# Patient Record
Sex: Female | Born: 1955 | Race: White | Hispanic: No | Smoking: Former smoker
Health system: Southern US, Community
[De-identification: ages and names within clinical notes are randomized; demographics above are authoritative.]

## PROBLEM LIST (undated history)

## (undated) DIAGNOSIS — Z8489 Family history of other specified conditions: Secondary | ICD-10-CM

## (undated) DIAGNOSIS — R251 Tremor, unspecified: Secondary | ICD-10-CM

## (undated) DIAGNOSIS — T7840XA Allergy, unspecified, initial encounter: Secondary | ICD-10-CM

## (undated) DIAGNOSIS — J439 Emphysema, unspecified: Secondary | ICD-10-CM

## (undated) DIAGNOSIS — D649 Anemia, unspecified: Secondary | ICD-10-CM

## (undated) DIAGNOSIS — F419 Anxiety disorder, unspecified: Secondary | ICD-10-CM

## (undated) DIAGNOSIS — J40 Bronchitis, not specified as acute or chronic: Secondary | ICD-10-CM

## (undated) DIAGNOSIS — F329 Major depressive disorder, single episode, unspecified: Secondary | ICD-10-CM

## (undated) DIAGNOSIS — C801 Malignant (primary) neoplasm, unspecified: Secondary | ICD-10-CM

## (undated) DIAGNOSIS — F32A Depression, unspecified: Secondary | ICD-10-CM

## (undated) DIAGNOSIS — R0902 Hypoxemia: Secondary | ICD-10-CM

## (undated) DIAGNOSIS — H269 Unspecified cataract: Secondary | ICD-10-CM

## (undated) DIAGNOSIS — I342 Nonrheumatic mitral (valve) stenosis: Secondary | ICD-10-CM

## (undated) DIAGNOSIS — I050 Rheumatic mitral stenosis: Secondary | ICD-10-CM

## (undated) DIAGNOSIS — R0600 Dyspnea, unspecified: Secondary | ICD-10-CM

## (undated) DIAGNOSIS — B1920 Unspecified viral hepatitis C without hepatic coma: Secondary | ICD-10-CM

## (undated) DIAGNOSIS — M19049 Primary osteoarthritis, unspecified hand: Secondary | ICD-10-CM

## (undated) HISTORY — DX: Emphysema, unspecified: J43.9

## (undated) HISTORY — DX: Nonrheumatic mitral (valve) stenosis: I34.2

## (undated) HISTORY — DX: Anemia, unspecified: D64.9

## (undated) HISTORY — DX: Allergy, unspecified, initial encounter: T78.40XA

## (undated) HISTORY — DX: Unspecified cataract: H26.9

## (undated) HISTORY — DX: Hypoxemia: R09.02

## (undated) HISTORY — DX: Rheumatic mitral stenosis: I05.0

## (undated) HISTORY — DX: Primary osteoarthritis, unspecified hand: M19.049

## (undated) HISTORY — PX: APPENDECTOMY: SHX54

---

## 1984-10-01 HISTORY — PX: ABDOMINAL HYSTERECTOMY: SHX81

## 1998-05-23 ENCOUNTER — Other Ambulatory Visit: Admission: RE | Admit: 1998-05-23 | Discharge: 1998-05-23 | Payer: Self-pay | Admitting: Obstetrics and Gynecology

## 1999-11-10 ENCOUNTER — Other Ambulatory Visit: Admission: RE | Admit: 1999-11-10 | Discharge: 1999-11-10 | Payer: Self-pay | Admitting: Obstetrics and Gynecology

## 2000-12-19 ENCOUNTER — Other Ambulatory Visit: Admission: RE | Admit: 2000-12-19 | Discharge: 2000-12-19 | Payer: Self-pay | Admitting: Obstetrics and Gynecology

## 2002-02-19 ENCOUNTER — Other Ambulatory Visit: Admission: RE | Admit: 2002-02-19 | Discharge: 2002-02-19 | Payer: Self-pay | Admitting: Obstetrics and Gynecology

## 2003-04-29 ENCOUNTER — Other Ambulatory Visit: Admission: RE | Admit: 2003-04-29 | Discharge: 2003-04-29 | Payer: Self-pay | Admitting: Obstetrics and Gynecology

## 2004-09-05 ENCOUNTER — Other Ambulatory Visit: Admission: RE | Admit: 2004-09-05 | Discharge: 2004-09-05 | Payer: Self-pay | Admitting: Obstetrics and Gynecology

## 2005-01-31 ENCOUNTER — Ambulatory Visit: Payer: Self-pay | Admitting: Internal Medicine

## 2005-10-17 ENCOUNTER — Other Ambulatory Visit: Admission: RE | Admit: 2005-10-17 | Discharge: 2005-10-17 | Payer: Self-pay | Admitting: Obstetrics and Gynecology

## 2006-09-06 ENCOUNTER — Ambulatory Visit (HOSPITAL_COMMUNITY): Admission: RE | Admit: 2006-09-06 | Discharge: 2006-09-06 | Payer: Self-pay | Admitting: Obstetrics and Gynecology

## 2006-10-31 ENCOUNTER — Ambulatory Visit: Payer: Self-pay | Admitting: Internal Medicine

## 2007-01-27 ENCOUNTER — Ambulatory Visit: Payer: Self-pay | Admitting: Internal Medicine

## 2007-01-28 ENCOUNTER — Ambulatory Visit: Payer: Self-pay | Admitting: Gastroenterology

## 2007-01-28 LAB — CONVERTED CEMR LAB
ALT: 46 units/L — ABNORMAL HIGH (ref 0–40)
AST: 35 units/L (ref 0–37)
Albumin: 4 g/dL (ref 3.5–5.2)
Alkaline Phosphatase: 78 units/L (ref 39–117)
Amylase: 82 units/L (ref 27–131)
BUN: 11 mg/dL (ref 6–23)
Basophils Absolute: 0 10*3/uL (ref 0.0–0.1)
Basophils Relative: 1 % (ref 0.0–1.0)
Bilirubin, Direct: 0.1 mg/dL (ref 0.0–0.3)
CO2: 29 meq/L (ref 19–32)
Calcium: 9.8 mg/dL (ref 8.4–10.5)
Chloride: 106 meq/L (ref 96–112)
Creatinine, Ser: 0.7 mg/dL (ref 0.4–1.2)
Eosinophils Absolute: 0.2 10*3/uL (ref 0.0–0.6)
Eosinophils Relative: 5.1 % — ABNORMAL HIGH (ref 0.0–5.0)
GFR calc Af Amer: 113 mL/min
GFR calc non Af Amer: 94 mL/min
Glucose, Bld: 93 mg/dL (ref 70–99)
HCT: 44.3 % (ref 36.0–46.0)
Hemoglobin: 15 g/dL (ref 12.0–15.0)
Lymphocytes Relative: 36.9 % (ref 12.0–46.0)
MCHC: 33.8 g/dL (ref 30.0–36.0)
MCV: 91.7 fL (ref 78.0–100.0)
Monocytes Absolute: 0.4 10*3/uL (ref 0.2–0.7)
Monocytes Relative: 9.9 % (ref 3.0–11.0)
Neutro Abs: 2.1 10*3/uL (ref 1.4–7.7)
Neutrophils Relative %: 47.1 % (ref 43.0–77.0)
Platelets: 213 10*3/uL (ref 150–400)
Potassium: 4.6 meq/L (ref 3.5–5.1)
RBC: 4.83 M/uL (ref 3.87–5.11)
RDW: 12 % (ref 11.5–14.6)
Sodium: 142 meq/L (ref 135–145)
Total Bilirubin: 0.7 mg/dL (ref 0.3–1.2)
Total Protein: 7.5 g/dL (ref 6.0–8.3)
WBC: 4.2 10*3/uL — ABNORMAL LOW (ref 4.5–10.5)

## 2007-02-10 ENCOUNTER — Ambulatory Visit: Payer: Self-pay | Admitting: Internal Medicine

## 2007-03-21 ENCOUNTER — Ambulatory Visit: Payer: Self-pay | Admitting: Gastroenterology

## 2007-03-21 LAB — CONVERTED CEMR LAB
ALT: 115 units/L — ABNORMAL HIGH (ref 0–40)
AST: 77 units/L — ABNORMAL HIGH (ref 0–37)
Albumin: 4.4 g/dL (ref 3.5–5.2)
Alkaline Phosphatase: 96 units/L (ref 39–117)
Anti Nuclear Antibody(ANA): NEGATIVE
Bilirubin, Direct: 0.1 mg/dL (ref 0.0–0.3)
Ceruloplasmin: 56 mg/dL (ref 21–63)
Ferritin: 23.5 ng/mL (ref 10.0–291.0)
HCV Ab: POSITIVE — AB
Hep A Total Ab: NEGATIVE
Hep B S Ab: POSITIVE — AB
Hepatitis B Surface Ag: NEGATIVE
INR: 0.8 — ABNORMAL LOW (ref 0.9–2.0)
Iron: 153 ug/dL — ABNORMAL HIGH (ref 42–145)
Prothrombin Time: 10.6 s (ref 10.0–14.0)
TSH: 3.25 microintl units/mL (ref 0.35–5.50)
Tissue Transglutaminase Ab, IgA: 0.3 units (ref <7)
Total Bilirubin: 0.9 mg/dL (ref 0.3–1.2)
Total Protein: 8.1 g/dL (ref 6.0–8.3)

## 2007-04-22 ENCOUNTER — Encounter: Payer: Self-pay | Admitting: Internal Medicine

## 2007-04-22 ENCOUNTER — Ambulatory Visit: Payer: Self-pay | Admitting: Gastroenterology

## 2007-04-22 ENCOUNTER — Encounter: Payer: Self-pay | Admitting: Gastroenterology

## 2007-04-22 LAB — CONVERTED CEMR LAB
Basophils Absolute: 0 10*3/uL (ref 0.0–0.1)
Basophils Relative: 0.3 % (ref 0.0–1.0)
Eosinophils Absolute: 0.1 10*3/uL (ref 0.0–0.6)
Eosinophils Relative: 1.6 % (ref 0.0–5.0)
HCT: 42.8 % (ref 36.0–46.0)
Hemoglobin: 15.2 g/dL — ABNORMAL HIGH (ref 12.0–15.0)
INR: 0.8 — ABNORMAL LOW (ref 0.9–2.0)
Lymphocytes Relative: 23.8 % (ref 12.0–46.0)
MCHC: 35.5 g/dL (ref 30.0–36.0)
MCV: 87.9 fL (ref 78.0–100.0)
Monocytes Absolute: 0.6 10*3/uL (ref 0.2–0.7)
Monocytes Relative: 8.6 % (ref 3.0–11.0)
Neutro Abs: 4.3 10*3/uL (ref 1.4–7.7)
Neutrophils Relative %: 65.7 % (ref 43.0–77.0)
Platelets: 222 10*3/uL (ref 150–400)
Prothrombin Time: 10.5 s (ref 10.0–14.0)
RBC: 4.87 M/uL (ref 3.87–5.11)
RDW: 12.1 % (ref 11.5–14.6)
WBC: 6.6 10*3/uL (ref 4.5–10.5)
aPTT: 25.3 s — ABNORMAL LOW (ref 26.5–36.5)

## 2007-04-29 ENCOUNTER — Ambulatory Visit (HOSPITAL_COMMUNITY): Admission: RE | Admit: 2007-04-29 | Discharge: 2007-04-29 | Payer: Self-pay | Admitting: Gastroenterology

## 2007-05-27 ENCOUNTER — Ambulatory Visit: Payer: Self-pay | Admitting: Gastroenterology

## 2007-10-10 ENCOUNTER — Encounter (INDEPENDENT_AMBULATORY_CARE_PROVIDER_SITE_OTHER): Payer: Self-pay | Admitting: Interventional Radiology

## 2007-10-10 ENCOUNTER — Ambulatory Visit (HOSPITAL_COMMUNITY): Admission: RE | Admit: 2007-10-10 | Discharge: 2007-10-10 | Payer: Self-pay | Admitting: Gastroenterology

## 2007-11-20 ENCOUNTER — Ambulatory Visit: Payer: Self-pay | Admitting: Gastroenterology

## 2007-12-16 ENCOUNTER — Ambulatory Visit: Payer: Self-pay | Admitting: Surgery

## 2007-12-30 ENCOUNTER — Ambulatory Visit: Payer: Self-pay | Admitting: Gastroenterology

## 2007-12-30 ENCOUNTER — Encounter: Payer: Self-pay | Admitting: Gastroenterology

## 2007-12-30 ENCOUNTER — Encounter: Payer: Self-pay | Admitting: Internal Medicine

## 2008-01-27 ENCOUNTER — Ambulatory Visit: Payer: Self-pay | Admitting: Gastroenterology

## 2008-02-04 ENCOUNTER — Telehealth: Payer: Self-pay | Admitting: Gastroenterology

## 2008-03-19 ENCOUNTER — Ambulatory Visit: Payer: Self-pay | Admitting: Internal Medicine

## 2008-03-19 DIAGNOSIS — Z87442 Personal history of urinary calculi: Secondary | ICD-10-CM

## 2008-03-19 DIAGNOSIS — F329 Major depressive disorder, single episode, unspecified: Secondary | ICD-10-CM

## 2008-03-19 DIAGNOSIS — F321 Major depressive disorder, single episode, moderate: Secondary | ICD-10-CM

## 2008-03-19 DIAGNOSIS — K219 Gastro-esophageal reflux disease without esophagitis: Secondary | ICD-10-CM

## 2008-03-19 DIAGNOSIS — K612 Anorectal abscess: Secondary | ICD-10-CM | POA: Insufficient documentation

## 2008-03-19 HISTORY — DX: Gastro-esophageal reflux disease without esophagitis: K21.9

## 2008-03-19 HISTORY — DX: Personal history of urinary calculi: Z87.442

## 2008-03-19 HISTORY — DX: Major depressive disorder, single episode, moderate: F32.1

## 2010-07-12 ENCOUNTER — Encounter: Payer: Self-pay | Admitting: Internal Medicine

## 2010-08-01 ENCOUNTER — Ambulatory Visit: Payer: Self-pay | Admitting: Internal Medicine

## 2010-08-01 DIAGNOSIS — G43909 Migraine, unspecified, not intractable, without status migrainosus: Secondary | ICD-10-CM

## 2010-08-01 HISTORY — DX: Migraine, unspecified, not intractable, without status migrainosus: G43.909

## 2010-10-06 ENCOUNTER — Encounter (HOSPITAL_COMMUNITY)
Admission: RE | Admit: 2010-10-06 | Discharge: 2010-10-31 | Payer: Self-pay | Source: Home / Self Care | Attending: Orthopedic Surgery | Admitting: Orthopedic Surgery

## 2010-10-31 NOTE — Assessment & Plan Note (Signed)
Summary: hemorrhoids/dm   Vital Signs:  Patient Profile:   55 Years Old Female Weight:      134 pounds Temp:     98.3 degrees F oral BP sitting:   120 / 84  (left arm) Cuff size:   regular  Vitals Entered By: Raechel Ache, RN (March 19, 2008 2:20 PM)                 Chief Complaint:  C/o lump outside rectum, extremely painful radiating down L leg- almost went to ER last night. Also vaginitis; odorous discharge, and red and swollen. Has Hep C..  History of Present Illness: 55 year old female with a 5 day history of rectal discomfort.  She also has a history of chronic vaginitis and for the past few days.  Has noted an increase vaginal odor and discharge.  There's been no fever or systemic symptoms.  She has been using sitz baths.    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)       gravida two, para two, abortus zero   Family History:    Family History of Arthritis    father died at 61 -murder    mother history of fibromyalgia, hypertension, obesity, gastroesophageal reflux disease, cerebrovascular disease        Two brothers are in good health    one sister, status post hysterectomy  Social History:    Former Smoker    Alcohol use-no    Drug use-no    Regular exercise-no    Married    two children, 6 grandchildren     Physical Exam  General:     uncomfortable but in no acute distress Rectal:     no visual  external abnormalities; she had  considerable tenderness involving the anal area from about the 3 to the 6 o'clock position.  Internal exam was unremarkable.  There is no fluctuanceperirectal tenderness.      Impression & Recommendations:  Problem # 1:  ABSCESS, PERIRECTAL  (ICD-566) patient has significant tenderness in the left perirectal area; this seems most consistent with a small perirectal abscess.  Will continue sitz baths 3 times to 4 times daily and treat with the metronidazole for infection and Vicodin for pain control.  She is aware that this may require I&D and will call the pain worsens  Complete Medication List: 1)  Metronidazole 500 Mg Tabs (Metronidazole) .... One three times daily 2)  Hydrocodone-acetaminophen 5-500 Mg Tabs (Hydrocodone-acetaminophen) .... One every 6 hours for pain   Patient Instructions: 1)  continue with the sitz baths 4 times daily 2)  Take your antibiotic as prescribed until ALL of it is gone, but stop if you develop a rash or swelling and contact our office as soon as possible. 3)  call if pain is not improved or worsens   Prescriptions: HYDROCODONE-ACETAMINOPHEN 5-500 MG  TABS (HYDROCODONE-ACETAMINOPHEN) one every 6 hours for pain  #40 x 0   Entered and Authorized by:   Gordy Savers  MD   Signed by:   Gordy Savers  MD on 03/19/2008   Method used:   Print then Give to Patient   RxID:   [id number] METRONIDAZOLE 500 MG  TABS (METRONIDAZOLE)  one three times daily  #30 x 0   Entered and Authorized by:   Gordy Savers  MD   Signed by:   Gordy Savers  MD on 03/19/2008   Method used:   Print then Give to Patient   RxID:   [id number]  ]

## 2010-10-31 NOTE — Procedures (Signed)
Summary: egd  egd   Imported By: Kassie Mends 03/22/2008 08:57:29  _____________________________________________________________________  External Attachment:    Type:   Image     Comment:   egd

## 2010-10-31 NOTE — Progress Notes (Signed)
Summary: NEEDS PRINTOUT OF APPTS  Phone Note Call from Patient Call back at Home Phone [phone]   Caller: Patient Summary of Call: [name] PT IS REQUESTING A PRINTOUT OF ALL VISITS, NO MATTER WHAT TYPE OF VISIT, SINCE HER VERY FIRST APPT WITH DR Kamron Vanwyhe.  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  PATIENT'S CHART HAS BEEN REQUESTED  Initial call taken by: Magdalen Spatz [facility],  Feb 04, 2008 8:40 AM  Follow-up for Phone Call        pt informed that she will need to come to [facility] and sign a release and t hey will  they will give her whatever she needs from her chart.she will do so Follow-up by: Teryl Lucy RN,  Feb 04, 2008 2:57 PM

## 2010-10-31 NOTE — Assessment & Plan Note (Signed)
Summary: migraines//ccm   Vital Signs:  Patient profile:   55 year old female Weight:      119 pounds Temp:     98.0 degrees F oral BP sitting:   104 / 68  (left arm) Cuff size:   regular  Vitals Entered By: Alfred Levins, CMA (August 01, 2010 12:57 PM) CC: itching all over, migraines, discuss labs   CC:  itching all over, migraines, and discuss labs.  History of Present Illness: 55 year old patient, who presents with a 4 week history of generalized pruritus.  She has seen dermatology in the laboratory screen revealed elevated liver function studies.  These are not available for review.  She does have a history of chronic hepatitis C, diagnosed in 2008.  A liver biopsy was performed at that time.  She has a history of depression and also migraine headaches.  Her depression has been stable, but she feels that her migraine headaches have become slightly more frequent.  They respond well to Fioricet.  Her chief complaint is pruritus  Current Medications (verified): 1)  Vivelle-Dot 0.0375 Mg/24hr  Pttw (Estradiol) .... Once Daily To Skin 2)  Lexapro 20 Mg Tabs (Escitalopram Oxalate) .Marland Kitchen.. 1 By Mouth Daily 3)  Fioricet 50-325-40 Mg Tabs (Butalbital-Apap-Caffeine) .... 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 Family History of Arthritis father died at 106 -murder mother history of fibromyalgia, hypertension, obesity, gastroesophageal reflux disease, cerebrovascular disease  Two brothers are in good health one sister, status post hysterectomy  Social History: Reviewed  history from 03/19/2008 and no changes required.  Smoker Alcohol use-no Drug use-no Regular exercise-no Married two children, 6 grandchildren  Review of Systems       The patient complains of headaches.  The patient denies anorexia, fever, weight loss, weight gain, vision loss, decreased hearing, hoarseness, chest pain, syncope, dyspnea on exertion, peripheral edema, prolonged cough, hemoptysis, melena, hematochezia, severe indigestion/heartburn, hematuria, incontinence, genital sores, muscle weakness, suspicious skin lesions, difficulty walking, depression, unusual weight change, abnormal bleeding, enlarged lymph nodes, angioedema, and breast masses.    Physical Exam  General:  Well-developed,well-nourished,in no acute distress; alert,appropriate and cooperative throughout examination Head:  Normocephalic and atraumatic without obvious abnormalities. No apparent alopecia or balding. Eyes:  No corneal or conjunctival inflammation noted. EOMI. Perrla. Funduscopic exam benign, without hemorrhages, exudates or papilledema. Vision grossly normal. Mouth:  Oral mucosa and oropharynx without lesions or exudates.  Teeth in good repair. Neck:  No deformities, masses, or tenderness noted. Lungs:  Normal respiratory effort, chest expands symmetrically. Lungs are clear to auscultation, no crackles or wheezes. Heart:  Normal rate and regular rhythm. S1 and S2 normal without gallop, murmur, click, rub or other extra sounds. Abdomen:  Bowel sounds positive,abdomen soft and non-tender without masses, organomegaly or hernias noted.  liver span seems slightly enlarged by percussion, but no definite liver edge palpable Msk:  No deformity or scoliosis noted of thoracic or lumbar spine.     Impression & Recommendations:  Problem # 1:  DEPRESSION (ICD-311)  Her updated medication list for this problem includes:    Lexapro 20 Mg Tabs (Escitalopram oxalate) .Marland Kitchen... 1 by mouth daily  Hydroxyzine Pamoate 50  Mg Caps (Hydroxyzine pamoate) ..... One every 6 hours as needed for itching  Her updated medication list for this problem includes:    Lexapro 20 Mg Tabs (Escitalopram oxalate) .Marland Kitchen... 1 by mouth daily    Hydroxyzine Pamoate 50 Mg Caps (Hydroxyzine pamoate) ..... One every 6 hours as needed for itching  Problem # 2:  HEPATITIS C (ICD-070.51) unclear whether this is a factor with the pruritus.  Will follow-up with GI  Problem # 3:  HEADACHE (ICD-784.0)  The following medications were removed from the medication list:    Hydrocodone-acetaminophen 5-500 Mg Tabs (Hydrocodone-acetaminophen) ..... One every 6 hours for pain Her updated medication list for this problem includes:    Fioricet 50-325-40 Mg Tabs (Butalbital-apap-caffeine) ..... Once daily  The following medications were removed from the medication list:    Hydrocodone-acetaminophen 5-500 Mg Tabs (Hydrocodone-acetaminophen) ..... One every 6 hours for pain Her updated medication list for this problem includes:    Fioricet 50-325-40 Mg Tabs (Butalbital-apap-caffeine) ..... Once daily  Complete Medication List: 1)  Vivelle-dot 0.0375 Mg/24hr Pttw (Estradiol) .... Once daily to skin 2)  Lexapro 20 Mg Tabs (Escitalopram oxalate) .Marland Kitchen.. 1 by mouth daily 3)  Fioricet 50-325-40 Mg Tabs (Butalbital-apap-caffeine) .... Once daily 4)  Hydroxyzine Pamoate 50 Mg Caps (Hydroxyzine pamoate) .... One every 6 hours as needed for itching  Patient Instructions: 1)  follow-up with GI 2)  try Allegra 180 mg daily 3)  Please schedule a follow-up appointment as needed. Prescriptions: HYDROXYZINE PAMOATE 50 MG CAPS (HYDROXYZINE PAMOATE) one every 6 hours as needed for itching  #30 x 6   Entered and Authorized by:   Gordy Savers  MD   Signed by:   Gordy Savers  MD on 08/01/2010   Method used:   Print then Give to Patient   RxID:   [id number] FIORICET 50-325-40 MG TABS [facility]) once daily  #90 x 2   Entered and  Authorized by:   Gordy Savers  MD   Signed by:   Gordy Savers  MD on 08/01/2010   Method used:   Print then Give to Patient   RxID:   [id number] LEXAPRO 20 MG TABS (ESCITALOPRAM OXALATE) 1 by mouth daily  #90 x 4   Entered and Authorized by:   Gordy Savers  MD   Signed by:   Gordy Savers  MD on 08/01/2010   Method used:   Print then Give to Patient   RxID:   [id number]    Orders Added: 1)  Est. Patient Level III [01601]

## 2011-02-13 NOTE — Assessment & Plan Note (Signed)
[facility]                         GASTROENTEROLOGY OFFICE NOTE   NAME:BERRIERValta, [name]                    MRN:          [id number]  DATE:03/21/2007                            DOB:          07-21-1956    REFERRING PHYSICIAN:  Gordy Savers, MD   REASON FOR REFERRAL:  Dr. Amador Cunas asked me to evaluate [name]  in consultation regarding chronic right upper quadrant pain.   HPI:  [name] is a very pleasant 55 year old woman, who has had a  pain in her right upper quadrant for 6-12 months.  She describes a spasm  type pain that is nearly constant throughout the day, although she wakes  up without the pain.  The pain is on her right side, just under her  ribs.  She says rubbing will sometimes help, leaning over will make it  feel unusual, lying on her right side hurts.  Sometimes she will have a  pain in her right upper back, as well.  The pain will stay fairly  constant throughout the day.  Eating certain foods, such as greasy  foods, will tend to make the pain worse.  She has had no nausea or  vomiting.   She also describes rather classic pyrosis.  This occurs nearly daily.  She tried Prevacid for it in the past, but was not taking it at the  correct time in relation to food and did not notice much of an  improvement.   She is also very clear that she has an intolerance to lactose products.  She avoids these.   She was told, at some point in the past, that she had both hepatitis B  and hepatitis C.  It is not clear how this was diagnosed.   Lastly, she had been bothered by chronic constipation for years, but has  recently been introduced to MiraLax and says that this dramatically has  helped and, on one scoop of MiraLax a day, she will have a bowel  movement nearly every day, non-bloody, without straining.   Recent laboratory tests show an essentially normal CBC, except for a  white count of 4.2 thousand, normal complete  metabolic profile, except  for an ALT of 46.  She had an ultrasound performed, April 2008, that was  normal, without gallbladder stones.  She had a CT scan, May of 2008,  which showed some sub-centimeter low-density lesions in her liver, but  was otherwise normal.   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   PAST MEDICAL HISTORY:  Anxiety, depression, kidney stones, allergies,  status post hysterectomy, status post appendectomy.   CURRENT MEDICINES:  Jeffie Pollock.   ALLERGIES:  To MORPHINE, ASPIRIN.   SOCIAL HISTORY:  Divorced recently.  Two children.  Works as a Freight forwarder, very stressful job.  Smokes one pack of cigarettes a day.  Nondrinker.  Rare caffeine and rare peppermint.   FAMILY HISTORY:  No colon cancer, colon polyps in family.   PHYSICAL EXAM:  Height 5 feet 4 inches, 134 pounds.  Blood pressure  104/52, pulse 76.  CONSTITUTIONAL:  Generally well-appearing.  NEUROLOGIC:  Alert and oriented times three.  EYES:  Extraocular movements intact.  MOUTH:  Oropharynx moist, no lesions.  NECK:  Supple, no lymphadenopathy.  CARDIOVASCULAR:  Heart regular rate and rhythm.  LUNGS:  Clear to auscultation bilaterally.  ABDOMEN:  Soft, nontender, nondistended.  Normal bowel sounds.  EXTREMITIES:  No lower extremity edema.  SKIN:  No rash or lesions on visible extremities.   ASSESSMENT AND PLAN:  Patient is a 55 year old woman with pyrosis, right  upper quadrant discomforts, chronic constipation that is much improved  on MiraLax; abnormal liver tests.   First, she has a questionable history of hepatitis B and C and has a  slightly elevated ALT.  I will arrange for more formal liver testing  with hepatitis viral serologies, ANA, AMA, anti-smooth-muscle  antibodies, ceruloplasmin, alpha 1 antitrypsin, iron studies, as well as  repeat liver tests and an  INR.   Her constipation is much improved on MiraLax and I encouraged her to  continue that on a daily basis.  We did not discuss colorectal cancer  screening at this point, but I will bring that up with her in the  future, if we can get her right upper quadrant discomfort settled down  first.   Her right upper quadrant discomfort is of unclear etiology.  Perhaps she  has GERD-related dyspepsia.  She was on Prevacid in the past and did not  notice much improvement with the right upper quadrant discomfort or her  more classic pyrosis, but she was not taking the Prevacid at the correct  time in relation to food and so I have given her samples of a proton  pump inhibitor and instructed her to take it 20-30 minutes prior to her  breakfast meal on a daily basis.  I will arrange for her to have an EGD  done in about three to four weeks.  I want to give her at least two to  three weeks time on PPI to judge whether that has been effective at the  time of her EGD.  Right upper quadrant pain sometimes is from a biliary  source.  Her gallbladder has been normal on CT scan, as well as  ultrasound, but possibly does not function normally.  We will have to  consider HIDA scan to estimate gallbladder ejection fraction, if the  remainder of her workup is negative.     Rachael Fee, MD  Electronically Signed    DPJ/MedQ  DD: 03/21/2007  DT: 03/21/2007  Job #: [id number]   cc:   Gordy Savers, MD

## 2011-02-13 NOTE — Assessment & Plan Note (Signed)
[facility]                         GASTROENTEROLOGY OFFICE NOTE   NAME:[name], [name]                     MRN:          [id number]  DATE:01/27/2008                            DOB:          16-Oct-1955    PRIMARY CARE PHYSICIAN:  Dr. Gordy Savers.   GI PROBLEM LIST:  1. Chronic hepatitis-C, mildly elevated transaminases:  Recent liver      biopsy under the direction of Dr. Molly Maduro L. Foy Guadalajara showed no      evidence of cirrhosis.  She will continue to follow up with Dr.      Foy Guadalajara.  2. Intermittent right upper quadrant pains, question spasm:  A CT scan      in 2008, essentially negative.  An ultrasound in 2008, normal      gallbladder with no stones.  A HIDA scan in 2008, normal.  An EGD      in 2008, normal.  Biopsies of the duodenum were normal.  Likely      spasm.  3. Routine risk for colorectal cancer:  She is status post colonoscopy      in March 2009.  Two small hyperplastic polyps were removed.  She      will need to repeat colonoscopy in 2019, and is in our reminder      system.   INTERVAL HISTORY:  I last saw [name] at the time of her colonoscopy.  She brought up these right upper quadrant pains that she had been  continuing to have since her initial evaluation by me about one year  ago.  The pains are decreasing in frequency.  She probably has an attack  of this right upper quadrant pain once every week or two.  The pain will  last about two hours.  She will rub her belly and that will sometimes  make it feel better.  She points to greasy fatty foods as being an often  culprit.  Moving her bowels does not tend to make any difference.   CURRENT MEDICATIONS:  1. EstroGel.  2. MiraLax.   PHYSICAL EXAMINATION:  VITAL SIGNS:  Weight 134 pounds which is stable  over one year.  Blood pressure 120/60, pulse 72.  CONSTITUTIONAL:  Generally well-appearing.  ABDOMEN:  Soft, nontender, nondistended with normal bowel sounds.   ASSESSMENT/PLAN:  A 55 year old woman with intermittent right upper  quadrant pains:  Her symptoms really seem biliary but her gallbladder  has been looked at with ultrasound, HIDA scan and has been normal.  She  does have elevated liver tests, but this is more likely from her chronic  hepatitis-C which is being followed by Dr. Foy Guadalajara.  I recommended simply  that she take an antispasmodic, Levsin sublingual and I will call her in  a prescription for that.  She will take this on an as-needed basis.   FOLLOWUP:  She will return to see me in two to three months, and sooner  if needed.     Rachael Fee, MD  Electronically Signed    DPJ/MedQ  DD: 01/27/2008  DT: 01/27/2008  Job #: [id number]   cc:  Marletta Lor, MD

## 2011-02-13 NOTE — Assessment & Plan Note (Signed)
[facility]                            [facility] OFFICE NOTE   NAME:Archer, Patricia REINERTSEN                    MRN:          [id number]  DATE:02/10/2007                            DOB:          08-03-1956    A 55 year old female who presents for followup of abdominal pain.  She  was seen approximately two weeks ago with right upper quadrant pain  which radiated to the back.  Abdominal ultrasound revealed no evidence  of cholelithiasis.  Her pancreas and liver were well-imaged and appeared  normal.  She continues to have significant pain in the epigastric and  right upper quadrant.  She has also complained of some increasing  abdominal girth.  There has been some nausea but no active emesis.  She  has experienced intermittent diarrhea.  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  She left work today due to the pain.   LABORATORY EVALUATION:  Revealed normal LFTs and normal white count two  weeks ago.  She has seen Dr. Corinda Gubler in the past for apparently history  of a stomach ulcer and also hiatal hernia.  She has had a screening  sigmoidoscopy but no prior colonoscopy.   PHYSICAL EXAMINATION:  GENERAL:  Revealed her to be in no acute distress  but clearly uncomfortable.  VITAL SIGNS:  Weight stable at 132, afebrile.  HEAD AND NECK:  Revealed no scleral icterus.  ENT normal.  Well-  hydrated.  CHEST:  Clear.  CARDIOVASCULAR:  Revealed no tachycardia.  ABDOMEN:  Did appear to be mildly distended.  There was tenderness  diffusely most marked in the epigastric and right upper quadrant.  There  is no guarding or rebound.  Bowel sounds were active.   IMPRESSION:  1. Abdominal pain of unclear etiology.  2. Remote history of gastric ulcer.  3. History of reflux.   DISPOSITION:  Will set up an abdominal and pelvic CT.  Will probably  need GI referral.     Gordy Savers, MD  Electronically Signed    PFK/MedQ  DD: 02/10/2007  DT: 02/10/2007  Job #: [id number]

## 2011-02-16 NOTE — Op Note (Signed)
NAME:  [name], [name]             ACCOUNT NO.:  [id number]   MEDICAL RECORD NO.:  [id number]          PATIENT TYPE:  AMB   LOCATION:  SDC                           FACILITY:  [facility]   PHYSICIAN:  Duke Salvia. Marcelle Overlie, M.D.DATE OF BIRTH:  16-Aug-1956   DATE OF PROCEDURE:  09/06/2006  DATE OF DISCHARGE:                               OPERATIVE REPORT   PREOPERATIVE DIAGNOSIS:  Left recurrent Bartholin cyst.   POSTOPERATIVE DIAGNOSIS:  Left recurrent Bartholin cyst.   PROCEDURE:  Marsupialization left Bartholin's.   SURGEON:  Duke Salvia. Marcelle Overlie, M.D.   ANESTHESIA:  Local plus general.   COMPLICATIONS:  None.   BLOOD LOSS:  Minimal.   SPECIMENS:  None.   PROCEDURE AND FINDINGS:  The patient was taken to the operating room.  After an adequate level of general anesthesia was obtained with the  patient's legs in stirrups, the perineum prepped and draped with  Betadine.  Local anesthetic of 0.5% Marcaine was used to inject into the  Bartholin cyst.  An 11 blade was used to make an incision along the  mucocutaneous margin, releasing some old purulent material.  This cyst  was unilocular.  Vicryl 3-0 Rapide interrupted sutures were then used  circumferentially to suture the skin to the cyst wall.  Plain packing  was placed into the cyst, to be removed in the recovery room.  She  tolerated this well, went to the recovery room in good condition.      Richard M. Marcelle Overlie, M.D.  Electronically Signed     RMH/MEDQ  D:  09/06/2006  T:  09/06/2006  Job:  [id number]

## 2011-06-21 LAB — CBC
HCT: 49.2 — ABNORMAL HIGH
Hemoglobin: 17 — ABNORMAL HIGH
MCHC: 34.7
MCV: 88.5
Platelets: 226
RBC: 5.56 — ABNORMAL HIGH
RDW: 12.3
WBC: 6.4

## 2011-06-21 LAB — PROTIME-INR
INR: 0.8
Prothrombin Time: 11.2 — ABNORMAL LOW

## 2012-07-26 ENCOUNTER — Encounter (HOSPITAL_COMMUNITY): Payer: Self-pay | Admitting: *Deleted

## 2012-07-26 ENCOUNTER — Emergency Department (HOSPITAL_COMMUNITY): Payer: Managed Care, Other (non HMO)

## 2012-07-26 ENCOUNTER — Emergency Department (HOSPITAL_COMMUNITY)
Admission: EM | Admit: 2012-07-26 | Discharge: 2012-07-26 | Disposition: A | Discharge status: Home / Self Care | Payer: Managed Care, Other (non HMO) | Attending: Emergency Medicine | Admitting: Emergency Medicine

## 2012-07-26 DIAGNOSIS — R002 Palpitations: Secondary | ICD-10-CM

## 2012-07-26 DIAGNOSIS — F17200 Nicotine dependence, unspecified, uncomplicated: Secondary | ICD-10-CM | POA: Insufficient documentation

## 2012-07-26 DIAGNOSIS — Z79899 Other long term (current) drug therapy: Secondary | ICD-10-CM | POA: Insufficient documentation

## 2012-07-26 HISTORY — DX: Bronchitis, not specified as acute or chronic: J40

## 2012-07-26 LAB — CBC WITH DIFFERENTIAL/PLATELET
Basophils Absolute: 0 10*3/uL (ref 0.0–0.1)
Basophils Relative: 0 % (ref 0–1)
Eosinophils Absolute: 0.1 10*3/uL (ref 0.0–0.7)
Eosinophils Relative: 1 % (ref 0–5)
HCT: 42.9 % (ref 36.0–46.0)
Hemoglobin: 15.3 g/dL — ABNORMAL HIGH (ref 12.0–15.0)
Lymphocytes Relative: 20 % (ref 12–46)
Lymphs Abs: 1.8 10*3/uL (ref 0.7–4.0)
MCH: 31.3 pg (ref 26.0–34.0)
MCHC: 35.7 g/dL (ref 30.0–36.0)
MCV: 87.7 fL (ref 78.0–100.0)
Monocytes Absolute: 0.6 10*3/uL (ref 0.1–1.0)
Monocytes Relative: 7 % (ref 3–12)
Neutro Abs: 6.7 10*3/uL (ref 1.7–7.7)
Neutrophils Relative %: 72 % (ref 43–77)
Platelets: 228 10*3/uL (ref 150–400)
RBC: 4.89 MIL/uL (ref 3.87–5.11)
RDW: 12.2 % (ref 11.5–15.5)
WBC: 9.3 10*3/uL (ref 4.0–10.5)

## 2012-07-26 LAB — BASIC METABOLIC PANEL
BUN: 15 mg/dL (ref 6–23)
CO2: 28 mEq/L (ref 19–32)
Calcium: 9.8 mg/dL (ref 8.4–10.5)
Chloride: 99 mEq/L (ref 96–112)
Creatinine, Ser: 0.69 mg/dL (ref 0.50–1.10)
GFR calc Af Amer: >90 mL/min (ref >90)
GFR calc non Af Amer: >90 mL/min (ref >90)
Glucose, Bld: 102 mg/dL — ABNORMAL HIGH (ref 70–99)
Potassium: 4.8 mEq/L (ref 3.5–5.1)
Sodium: 135 mEq/L (ref 135–145)

## 2012-07-26 MED ORDER — ALPRAZOLAM 0.25 MG PO TABS: 0.2500 mg | ORAL_TABLET | Freq: Three times a day (TID) | ORAL | Status: DC | PRN

## 2012-07-26 NOTE — ED Notes (Signed)
Pt presents to ED with c/o chest pain that started after visiting her mother who's inpatient in ICU. Pt sts she is under a lot of stress and is very scared because her friend just had heart attack and had same symptoms.EKG has been done,evaluated by EDP.

## 2012-07-26 NOTE — ED Provider Notes (Signed)
History     CSN: [id number]  Arrival date & time 07/26/12  1432   First MD Initiated Contact with Patient 07/26/12 1504      CC:  Funny sensation in chest  (Conside HPI Comments: Pt has been  Having a lot of stress recently.  Her mother is in the ICU.  She has been noticing that her left hand has intermittently been going numb.    During those times she will feel and odd sensation like a fluttering in her chest.  She has felt fatigued and lightheaded.  She has not had any pain.  Patient is a 56 y.o. female presenting with palpitations.  Palpitations  This is a new problem. The current episode started more than 2 days ago (4-5 days). The problem occurs daily. The problem has been gradually worsening. The problem is associated with an unknown factor. Associated symptoms include shortness of breath (she does smoke). Pertinent negatives include no fever, no chest pain, no chest pressure, no exertional chest pressure, no irregular heartbeat, no abdominal pain, no nausea, no vomiting, no leg pain, no lower extremity edema, no cough and no sputum production. Associated symptoms comments: No trouble with speech, balance or coordination. She has tried nothing for the symptoms. Risk factors include stress.  She was concerned when it happened again today while visiting mom.  She wanted to get checked out to make sure she is oK.  History reviewed. No pertinent past medical history.  No past surgical history on file.  No family history on file.  History  Substance Use Topics  . Smoking status: Not on file  . Smokeless tobacco: Not on file  . Alcohol Use: Not on file    OB History    Grav Para Term Preterm Abortions TAB SAB Ect Mult Living                  Review of Systems  Constitutional: Negative for fever.  Respiratory: Positive for shortness of breath (she does smoke). Negative for cough and sputum production.   Cardiovascular: Positive for palpitations. Negative for chest pain.    Gastrointestinal: Negative for nausea, vomiting and abdominal pain.    Allergies  Aspirin; Glycopyrrolate; and Morphine sulfate  Home Medications   Current Outpatient Rx  Name Route Sig Dispense Refill  . ALBUTEROL SULFATE HFA 108 (90 BASE) MCG/ACT IN AERS Inhalation Inhale 2 puffs into the lungs every 6 (six) hours as needed. For shortness of breath.    . ESZOPICLONE 3 MG PO TABS Oral Take 3 mg by mouth at bedtime. Take immediately before bedtime    . IBUPROFEN 200 MG PO TABS Oral Take 400 mg by mouth every 8 (eight) hours as needed. For pain.    Marland Kitchen OMEPRAZOLE-SODIUM BICARBONATE 40-1100 MG PO CAPS Oral Take 1 capsule by mouth 2 (two) times daily.      BP 148/72  Pulse 82  Temp 98 F (36.7 C) (Oral)  Resp 13  SpO2 100%  Physical Exam  Nursing note and vitals reviewed. Constitutional: She is oriented to person, place, and time. She appears well-developed and well-nourished. No distress.  HENT:  Head: Normocephalic and atraumatic.  Right Ear: External ear normal.  Left Ear: External ear normal.  Mouth/Throat: Oropharynx is clear and moist.  Eyes: Conjunctivae normal are normal. Right eye exhibits no discharge. Left eye exhibits no discharge. No scleral icterus.  Neck: Neck supple. No tracheal deviation present.  Cardiovascular: Normal rate, regular rhythm and intact distal pulses.  Pulmonary/Chest: Effort normal and breath sounds normal. No stridor. No respiratory distress. She has no wheezes. She has no rales.  Abdominal: Soft. Bowel sounds are normal. She exhibits no distension. There is no tenderness. There is no rebound and no guarding.  Musculoskeletal: She exhibits no edema and no tenderness.  Neurological: She is alert and oriented to person, place, and time. She has normal strength. She displays tremor. No cranial nerve deficit ( no gross defecits noted) or sensory deficit. She exhibits normal muscle tone. She displays no seizure activity. Coordination normal.       No  pronator drift bilateral upper extrem, able to hold both legs off bed for 5 seconds, sensation intact in all extremities, no visual field cuts, no left or right sided neglect  Skin: Skin is warm and dry. No rash noted.  Psychiatric: Her mood appears anxious.    ED Course  Procedures (including critical care time)  Labs Reviewed  CBC WITH DIFFERENTIAL - Abnormal; Notable for the following:    Hemoglobin 15.3 (*)     All other components within normal limits  BASIC METABOLIC PANEL - Abnormal; Notable for the following:    Glucose, Bld 102 (*)     All other components within normal limits   Dg Chest 2 View  07/26/2012  *RADIOLOGY REPORT*  Clinical Data: 56 year old female with chest pain shortness of breath left extremity numbness.  CHEST - 2 VIEW  Comparison: None.  Findings: Somewhat large lung volumes and increased AP dimension to the chest.  Cardiac size and mediastinal contours are within normal limits.  Visualized tracheal air column is within normal limits. No pneumothorax, pulmonary edema, pleural effusion or confluent pulmonary opacity.  Mild diffuse increased interstitial markings. No acute osseous abnormality identified.  IMPRESSION: No acute cardiopulmonary abnormality.  Pulmonary hyperinflation.   Original Report Authenticated By: Harley Hallmark, M.D.      1. Palpitation       MDM  Likely related to her anxiety and stress of her mother's illness.  Doubt ACS, PE.  No arrythmia noted in the ed.        Celene Kras, MD 07/27/12 [phone]

## 2012-07-26 NOTE — ED Notes (Signed)
During hourly rounding pt admitted to this recorder that she is under a lot of stress with sick mother and family is putting pressure on her, because mom lives with pt. Pt became tearful, sts "I just need to feel little bit better to be able to take care of mom when she comes back home but they don't care." This RN offered chaplain care to pt, she refused at this time.

## 2012-07-29 ENCOUNTER — Encounter: Payer: Self-pay | Admitting: Internal Medicine

## 2012-07-29 ENCOUNTER — Ambulatory Visit (INDEPENDENT_AMBULATORY_CARE_PROVIDER_SITE_OTHER): Payer: Managed Care, Other (non HMO) | Admitting: Internal Medicine

## 2012-07-29 VITALS — BP 124/80 | HR 82 | Temp 97.8°F | Ht 65.0 in | Wt 118.0 lb

## 2012-07-29 DIAGNOSIS — Z23 Encounter for immunization: Secondary | ICD-10-CM

## 2012-07-29 DIAGNOSIS — F329 Major depressive disorder, single episode, unspecified: Secondary | ICD-10-CM

## 2012-07-29 DIAGNOSIS — B1710 Acute hepatitis C without hepatic coma: Secondary | ICD-10-CM

## 2012-07-29 DIAGNOSIS — F4322 Adjustment disorder with anxiety: Secondary | ICD-10-CM

## 2012-07-29 MED ORDER — PAROXETINE HCL 20 MG PO TABS: 20.0000 mg | ORAL_TABLET | ORAL | Status: DC

## 2012-07-29 MED ORDER — ALPRAZOLAM 0.25 MG PO TABS: ORAL_TABLET | ORAL | Status: DC

## 2012-07-29 MED ORDER — ALPRAZOLAM 0.25 MG PO TABS: 0.2500 mg | ORAL_TABLET | Freq: Three times a day (TID) | ORAL | Status: DC | PRN

## 2012-07-29 NOTE — Progress Notes (Signed)
Subjective:    Patient ID: [name], female    DOB: 11/26/1955, 56 y.o.   MRN: [id number]  HPI  56 year old patient who is seen today following an ED visit. She presented with palpitations and vague chest discomfort and primarily numbness and coolness of her extremities. She was quite concerned about her cardiac condition and has been very anxious for several weeks. She has had a number of recent stressors including marital issues with her husband. Her mother also has been quite ill and has been in the hospital and will be returning home. She has senile dementia. The patient also abruptly discontinued approximately in 2 weeks ago. She is followed by behavioral health. She was given a small prescription for Xanax from the ED which he has not filled. She remains off her oxygen. She said remains quite nervous anxious and requires the Lunesta to assist with sleep ED records reviewed  Past Medical History  Diagnosis Date  . Bronchitis     History   Social History  . Marital Status: Married    Spouse Name: N/A    Number of Children: N/A  . Years of Education: N/A   Occupational History  . Not on file.   Social History Main Topics  . Smoking status: Current Every Day Smoker -- 1.0 packs/day    Types: Cigarettes  . Smokeless tobacco: Never Used  . Alcohol Use: 0.6 oz/week    1 Shots of liquor per week  . Drug Use: No  . Sexually Active: Not on file   Other Topics Concern  . Not on file   Social History Narrative  . No narrative on file    Past Surgical History  Procedure Date  . Abdominal hysterectomy     Family History  Problem Relation Age of Onset  . Asthma Mother   . Alzheimer's disease Mother   . Cancer Neg Hx     family hx - fathers side - lung Ca    Allergies  Allergen Reactions  . Aspirin     REACTION: unspecified  . Glycopyrrolate     REACTION: unspecified  . Morphine Sulfate     REACTION: unspecified    Current Outpatient Prescriptions on File  Prior to Visit  Medication Sig Dispense Refill  . albuterol (PROVENTIL HFA;VENTOLIN HFA) 108 (90 BASE) MCG/ACT inhaler Inhale 2 puffs into the lungs every 6 (six) hours as needed. For shortness of breath.      . Eszopiclone (ESZOPICLONE) 3 MG TABS Take 3 mg by mouth at bedtime. Take immediately before bedtime      . ibuprofen (ADVIL,MOTRIN) 200 MG tablet Take 400 mg by mouth every 8 (eight) hours as needed. For pain.      Marland Kitchen omeprazole-sodium bicarbonate (ZEGERID) 40-1100 MG per capsule Take 1 capsule by mouth 2 (two) times daily.      Marland Kitchen VIVELLE-DOT 0.1 MG/24HR Place 1 patch onto the skin 2 (two) times a week.       . ALPRAZolam (XANAX) 0.25 MG tablet Take 1 tablet (0.25 mg total) by mouth 3 (three) times daily as needed for sleep.  12 tablet  0  . PARoxetine (PAXIL) 20 MG tablet         BP 124/80  Pulse 82  Temp 97.8 F (36.6 C) (Oral)  Ht 5\' 5"  (1.651 m)  Wt 118 lb (53.524 kg)  BMI 19.64 kg/m2  SpO2 97%       Review of Systems  Constitutional: Negative.   HENT: Negative for hearing  loss, congestion, sore throat, rhinorrhea, dental problem, sinus pressure and tinnitus.   Eyes: Negative for pain, discharge and visual disturbance.  Respiratory: Negative for cough and shortness of breath.   Cardiovascular: Negative for chest pain, palpitations and leg swelling.  Gastrointestinal: Negative for nausea, vomiting, abdominal pain, diarrhea, constipation, blood in stool and abdominal distention.  Genitourinary: Negative for dysuria, urgency, frequency, hematuria, flank pain, vaginal bleeding, vaginal discharge, difficulty urinating, vaginal pain and pelvic pain.  Musculoskeletal: Negative for joint swelling, arthralgias and gait problem.  Skin: Negative for rash.  Neurological: Negative for dizziness, syncope, speech difficulty, weakness, numbness and headaches.  Hematological: Negative for adenopathy.  Psychiatric/Behavioral: Positive for behavioral problems, disturbed wake/sleep cycle,  dysphoric mood and decreased concentration. Negative for agitation. The patient is nervous/anxious.        Objective:   Physical Exam  Constitutional: She is oriented to person, place, and time. She appears well-developed and well-nourished.       Extremely anxious Blood pressure low normal  HENT:  Head: Normocephalic.  Right Ear: External ear normal.  Left Ear: External ear normal.  Mouth/Throat: Oropharynx is clear and moist.  Eyes: Conjunctivae normal and EOM are normal. Pupils are equal, round, and reactive to light.  Neck: Normal range of motion. Neck supple. No thyromegaly present.  Cardiovascular: Normal rate, regular rhythm, normal heart sounds and intact distal pulses.   Pulmonary/Chest: Effort normal and breath sounds normal.  Abdominal: Soft. Bowel sounds are normal. She exhibits no mass. There is no tenderness.  Musculoskeletal: Normal range of motion.  Lymphadenopathy:    She has no cervical adenopathy.  Neurological: She is alert and oriented to person, place, and time.  Skin: Skin is warm and dry. No rash noted.  Psychiatric: She has a normal mood and affect. Her behavior is normal.          Assessment & Plan:   Adjustment disorder with anxiety History depression Abrupt the paroxetine with strong  Will resume paroxetine Behavioral health followup Short-term Xanax Moderate caffeine use

## 2012-07-29 NOTE — Patient Instructions (Signed)
It is important that you exercise regularly, at least 20 minutes 3 to 4 times per week.  If you develop chest pain or shortness of breath seek  medical attention.  Followup behavioral health specialists  Call or return to clinic prn if these symptoms worsen or fail to improve as anticipated.

## 2012-10-07 ENCOUNTER — Other Ambulatory Visit (INDEPENDENT_AMBULATORY_CARE_PROVIDER_SITE_OTHER): Payer: BC Managed Care – PPO

## 2012-10-07 DIAGNOSIS — Z0000 Encounter for general adult medical examination without abnormal findings: Secondary | ICD-10-CM

## 2012-10-07 LAB — TSH: TSH: 2.56 u[IU]/mL (ref 0.35–5.50)

## 2012-10-07 LAB — CBC WITH DIFFERENTIAL/PLATELET
Basophils Absolute: 0.1 10*3/uL (ref 0.0–0.1)
Basophils Relative: 0.7 % (ref 0.0–3.0)
Eosinophils Absolute: 0.1 10*3/uL (ref 0.0–0.7)
Eosinophils Relative: 2.2 % (ref 0.0–5.0)
HCT: 45.6 % (ref 36.0–46.0)
Hemoglobin: 15.8 g/dL — ABNORMAL HIGH (ref 12.0–15.0)
Lymphocytes Relative: 28 % (ref 12.0–46.0)
Lymphs Abs: 1.9 10*3/uL (ref 0.7–4.0)
MCHC: 34.6 g/dL (ref 30.0–36.0)
MCV: 90.3 fl (ref 78.0–100.0)
Monocytes Absolute: 0.6 10*3/uL (ref 0.1–1.0)
Monocytes Relative: 9.1 % (ref 3.0–12.0)
Neutro Abs: 4 10*3/uL (ref 1.4–7.7)
Neutrophils Relative %: 60 % (ref 43.0–77.0)
Platelets: 232 10*3/uL (ref 150.0–400.0)
RBC: 5.05 Mil/uL (ref 3.87–5.11)
RDW: 13.3 % (ref 11.5–14.6)
WBC: 6.7 10*3/uL (ref 4.5–10.5)

## 2012-10-07 LAB — POCT URINALYSIS DIPSTICK
Bilirubin, UA: NEGATIVE
Blood, UA: NEGATIVE
Glucose, UA: NEGATIVE
Ketones, UA: NEGATIVE
Leukocytes, UA: NEGATIVE
Nitrite, UA: NEGATIVE
Spec Grav, UA: 1.015
Urobilinogen, UA: 2
pH, UA: 7

## 2012-10-07 LAB — HEPATIC FUNCTION PANEL
ALT: 27 U/L (ref 0–35)
AST: 28 U/L (ref 0–37)
Albumin: 4.2 g/dL (ref 3.5–5.2)
Alkaline Phosphatase: 72 U/L (ref 39–117)
Bilirubin, Direct: 0.1 mg/dL (ref 0.0–0.3)
Total Bilirubin: 1.2 mg/dL (ref 0.3–1.2)
Total Protein: 7.6 g/dL (ref 6.0–8.3)

## 2012-10-07 LAB — BASIC METABOLIC PANEL
BUN: 10 mg/dL (ref 6–23)
CO2: 27 mEq/L (ref 19–32)
Calcium: 9.7 mg/dL (ref 8.4–10.5)
Chloride: 101 mEq/L (ref 96–112)
Creatinine, Ser: 0.9 mg/dL (ref 0.4–1.2)
GFR: 71.3 mL/min (ref >60.00)
Glucose, Bld: 92 mg/dL (ref 70–99)
Potassium: 4.8 mEq/L (ref 3.5–5.1)
Sodium: 136 mEq/L (ref 135–145)

## 2012-10-07 LAB — LIPID PANEL
Cholesterol: 194 mg/dL (ref 0–200)
HDL: 42.6 mg/dL (ref >39.00)
LDL Cholesterol: 125 mg/dL — ABNORMAL HIGH (ref 0–99)
Total CHOL/HDL Ratio: 5
Triglycerides: 132 mg/dL (ref 0.0–149.0)
VLDL: 26.4 mg/dL (ref 0.0–40.0)

## 2012-10-14 ENCOUNTER — Ambulatory Visit (INDEPENDENT_AMBULATORY_CARE_PROVIDER_SITE_OTHER): Payer: BC Managed Care – PPO | Admitting: Internal Medicine

## 2012-10-14 ENCOUNTER — Encounter: Payer: Self-pay | Admitting: Internal Medicine

## 2012-10-14 VITALS — BP 110/62 | HR 93 | Temp 98.0°F | Resp 18 | Ht 65.75 in | Wt 116.0 lb

## 2012-10-14 DIAGNOSIS — R259 Unspecified abnormal involuntary movements: Secondary | ICD-10-CM

## 2012-10-14 DIAGNOSIS — R251 Tremor, unspecified: Secondary | ICD-10-CM

## 2012-10-14 DIAGNOSIS — B1710 Acute hepatitis C without hepatic coma: Secondary | ICD-10-CM

## 2012-10-14 HISTORY — DX: Tremor, unspecified: R25.1

## 2012-10-14 MED ORDER — PROPRANOLOL HCL ER 60 MG PO CP24: 60.0000 mg | ORAL_CAPSULE | Freq: Every day | ORAL | Status: DC

## 2012-10-14 MED ORDER — ALPRAZOLAM 0.25 MG PO TABS: ORAL_TABLET | ORAL | Status: DC

## 2012-10-14 MED ORDER — ESZOPICLONE 3 MG PO TABS: 3.0000 mg | ORAL_TABLET | Freq: Every day | ORAL | Status: DC

## 2012-10-14 MED ORDER — PAROXETINE HCL 20 MG PO TABS: 20.0000 mg | ORAL_TABLET | ORAL | Status: DC

## 2012-10-14 NOTE — Progress Notes (Signed)
Subjective:    Patient ID: [name], female    DOB: 26-Jan-1956, 57 y.o.   MRN: [id number]  HPI  57 year old patient who is seen today for a preventive health examination. She was seen by gynecology in the fall and is on transdermal hormone replacement therapy. Her chief complaint today is a tremor that has been present for about one year. She has recently separated from her husband and has other stressors including recent loss of her job as well as caring for an elderly parent at her home who requires 24 7 assistance.  She continues to smoke and also has a high caffeine load.  Past Medical History  Diagnosis Date  . Bronchitis     History   Social History  . Marital Status: Married    Spouse Name: N/A    Number of Children: N/A  . Years of Education: N/A   Occupational History  . Not on file.   Social History Main Topics  . Smoking status: Current Every Day Smoker -- 1.0 packs/day    Types: Cigarettes  . Smokeless tobacco: Never Used  . Alcohol Use: 0.6 oz/week    1 Shots of liquor per week  . Drug Use: No  . Sexually Active: Not on file   Other Topics Concern  . Not on file   Social History Narrative  . No narrative on file    Past Surgical History  Procedure Date  . Abdominal hysterectomy     Family History  Problem Relation Age of Onset  . Asthma Mother   . Alzheimer's disease Mother   . Cancer Neg Hx     family hx - fathers side - lung Ca    Allergies  Allergen Reactions  . Aspirin     REACTION: unspecified  . Glycopyrrolate     REACTION: unspecified  . Morphine Sulfate     REACTION: unspecified    Current Outpatient Prescriptions on File Prior to Visit  Medication Sig Dispense Refill  . albuterol (PROVENTIL HFA;VENTOLIN HFA) 108 (90 BASE) MCG/ACT inhaler Inhale 2 puffs into the lungs every 6 (six) hours as needed. For shortness of breath.      . ALPRAZolam (XANAX) 0.25 MG tablet 1 tablet twice daily as needed  60 tablet  1  . Eszopiclone  (ESZOPICLONE) 3 MG TABS Take 3 mg by mouth at bedtime. Take immediately before bedtime      . ibuprofen (ADVIL,MOTRIN) 200 MG tablet Take 400 mg by mouth every 8 (eight) hours as needed. For pain.      Marland Kitchen PARoxetine (PAXIL) 20 MG tablet Take 1 tablet (20 mg total) by mouth every morning.  30 tablet  4  . VIVELLE-DOT 0.1 MG/24HR Place 1 patch onto the skin 2 (two) times a week.         BP 110/62  Pulse 93  Temp 98 F (36.7 C) (Oral)  Resp 18  Ht 5' 5.75" (1.67 m)  Wt 116 lb (52.617 kg)  BMI 18.87 kg/m2  SpO2 98%    Review of Systems  Constitutional: Negative.   HENT: Negative for hearing loss, congestion, sore throat, rhinorrhea, dental problem, sinus pressure and tinnitus.   Eyes: Negative for pain, discharge and visual disturbance.  Respiratory: Negative for cough and shortness of breath.   Cardiovascular: Negative for chest pain, palpitations and leg swelling.  Gastrointestinal: Negative for nausea, vomiting, abdominal pain, diarrhea, constipation, blood in stool and abdominal distention.  Genitourinary: Negative for dysuria, urgency, frequency, hematuria, flank pain, vaginal  bleeding, vaginal discharge, difficulty urinating, vaginal pain and pelvic pain.  Musculoskeletal: Negative for joint swelling, arthralgias and gait problem.  Skin: Negative for rash.  Neurological: Positive for tremors. Negative for dizziness, syncope, speech difficulty, weakness, numbness and headaches.  Hematological: Negative for adenopathy.  Psychiatric/Behavioral: Negative for behavioral problems, dysphoric mood and agitation. The patient is nervous/anxious.        Objective:   Physical Exam  Constitutional: She is oriented to person, place, and time. She appears well-developed and well-nourished.  HENT:  Head: Normocephalic and atraumatic.  Right Ear: External ear normal.  Left Ear: External ear normal.  Mouth/Throat: Oropharynx is clear and moist.  Eyes: Conjunctivae normal and EOM are normal.    Neck: Normal range of motion. Neck supple. No JVD present. No thyromegaly present.  Cardiovascular: Normal rate, regular rhythm, normal heart sounds and intact distal pulses.   No murmur heard. Pulmonary/Chest: Effort normal and breath sounds normal. She has no wheezes. She has no rales.  Abdominal: Soft. Bowel sounds are normal. She exhibits no distension and no mass. There is no tenderness. There is no rebound and no guarding.  Musculoskeletal: Normal range of motion. She exhibits no edema and no tenderness.  Neurological: She is alert and oriented to person, place, and time. She has normal reflexes. No cranial nerve deficit. She exhibits normal muscle tone. Coordination normal.       Tremor noted  Skin: Skin is warm and dry. No rash noted.       Multiple tattoos  Psychiatric: She has a normal mood and affect. Her behavior is normal.          Assessment & Plan:  Preventive health examination Tremor.  Believe patient probably has an essential tremor but aggravated by multiple stressors including nicotine and caffeine load. Tremors alleviated by alprazolam. We'll discuss low-dose beta blocker therapy Chronic hepatitis C status post biopsy 2008

## 2012-10-14 NOTE — Patient Instructions (Addendum)
It is important that you exercise regularly, at least 20 minutes 3 to 4 times per week.  If you develop chest pain or shortness of breath seek  medical attention.  Return in 6 months for follow-up  

## 2012-12-26 ENCOUNTER — Encounter (INDEPENDENT_AMBULATORY_CARE_PROVIDER_SITE_OTHER): Payer: Self-pay | Admitting: General Surgery

## 2012-12-26 ENCOUNTER — Ambulatory Visit (INDEPENDENT_AMBULATORY_CARE_PROVIDER_SITE_OTHER): Payer: BC Managed Care – PPO | Admitting: General Surgery

## 2012-12-26 VITALS — BP 130/82 | HR 71 | Temp 97.6°F | Resp 18 | Ht 65.0 in | Wt 120.2 lb

## 2012-12-26 DIAGNOSIS — K644 Residual hemorrhoidal skin tags: Secondary | ICD-10-CM

## 2012-12-26 MED ORDER — LIDOCAINE HCL 2 % EX GEL: CUTANEOUS | Status: DC

## 2012-12-26 NOTE — Progress Notes (Signed)
Patient ID: [name], female   DOB: 08-16-1956, 57 y.o.   MRN: [id number]  Chief Complaint  Patient presents with  . New Evaluation    eval internal hems    HPI [name] is a 57 y.o. female.  This patient presents for evaluation ofexternal hemorrhoids. She says that she has a history of prior thrombosed hemorrhoids which required incision and drainage about 10 years ago and since then she has not had any issues in that area. She says that this current area of discomfort and started Saturday at with some severe discomfort in the rectal area and she says that she noticed a bulge which comes and goes. She says that she normally has a bulge in that area which will come and go depending on her bowels. As long as her bowels are normal then she doesn't seem to have any issues with it but if she gets any constipation or diarrhea and it returns with discomfort. Currently she has pain with bowel movements and discomfort when touching the area of otherwise she doesn't have any discomfort. She says her bowels are generally normal moving her bowels once a day. She does not take any stool softeners or fiber supplements and she said that she had a colonoscopy about 5-6 years ago which had a polyp but she was told was otherwise normal and that she should followup in 10 years. In HPI  Past Medical History  Diagnosis Date  . Bronchitis     Past Surgical History  Procedure Laterality Date  . Abdominal hysterectomy  1986    Family History  Problem Relation Age of Onset  . Asthma Mother   . Alzheimer's disease Mother   . Hypertension Mother   . Cancer Maternal Aunt   . Cancer Paternal Aunt     Breast    Social History History  Substance Use Topics  . Smoking status: Current Every Day Smoker -- 1.00 packs/day    Types: Cigarettes  . Smokeless tobacco: Never Used  . Alcohol Use: 0.6 oz/week    1 Shots of liquor per week     Comment: Occasional.    Allergies  Allergen Reactions  .  Aspirin     REACTION: unspecified  . Glycopyrrolate     REACTION: unspecified  . Morphine Sulfate     REACTION: unspecified    Current Outpatient Prescriptions  Medication Sig Dispense Refill  . albuterol (PROVENTIL HFA;VENTOLIN HFA) 108 (90 BASE) MCG/ACT inhaler Inhale 2 puffs into the lungs every 6 (six) hours as needed. For shortness of breath.      . Eszopiclone (ESZOPICLONE) 3 MG TABS Take 1 tablet (3 mg total) by mouth at bedtime. Take immediately before bedtime  60 tablet  3  . ibuprofen (ADVIL,MOTRIN) 200 MG tablet Take 400 mg by mouth every 8 (eight) hours as needed. For pain.      . metroNIDAZOLE (METROGEL) 0.75 % vaginal gel Place 1 Applicatorful vaginally 2 (two) times daily.      Marland Kitchen PARoxetine (PAXIL) 20 MG tablet Take 1 tablet (20 mg total) by mouth every morning.  90 tablet  4  . propranolol ER (INDERAL LA) 60 MG 24 hr capsule Take 1 capsule (60 mg total) by mouth daily.  60 capsule  2  . VIVELLE-DOT 0.1 MG/24HR Place 1 patch onto the skin 2 (two) times a week.        No current facility-administered medications for this visit.    Review of Systems Review of Systems All other  review of systems negative or noncontributory except as stated in the HPI  Blood pressure 130/82, pulse 71, temperature 97.6 F (36.4 C), resp. rate 18, height 5\' 5"  (1.651 m), weight 120 lb 3.2 oz (54.522 kg), SpO2 98.00%.  Physical Exam Physical Exam Physical Exam  Nursing note and vitals reviewed. Constitutional: She is oriented to person, place, and time. She appears well-developed and well-nourished. No distress.  HENT:  Head: Normocephalic and atraumatic.  Mouth/Throat: No oropharyngeal exudate.  Eyes: Conjunctivae and EOM are normal. Pupils are equal, round, and reactive to light. Right eye exhibits no discharge. Left eye exhibits no discharge. No scleral icterus.  Neck: Normal range of motion. Neck supple. No tracheal deviation present.  Cardiovascular: Normal rate, regular rhythm,  normal heart sounds and intact distal pulses.   Pulmonary/Chest: Effort normal and breath sounds normal. No stridor. No respiratory distress. She has no wheezes.  Abdominal: Soft. Bowel sounds are normal. She exhibits no distension and no mass. There is no tenderness. There is no rebound and no guarding.  Rectal:  She has a small external hemorrhoid in the left lateral position with small amount of ulceration.  Not much thrombosis remaining. No other internal masses seen on anoscopy Musculoskeletal: Normal range of motion. She exhibits no edema and no tenderness.  Neurological: She is alert and oriented to person, place, and time.  Skin: Skin is warm and dry. No rash noted. She is not diaphoretic. No erythema. No pallor.  Psychiatric: She has a normal mood and affect. Her behavior is normal. Judgment and thought content normal.    Data Reviewed   Assessment    External hemorrhoid  she does have some external hemorrhoids with evidence of recent thrombosis. However, this appears to be almost resolved from a thrombosis standpoint but I think her symptoms are related to some skin irritation and ulceration immediately on the hemorrhoid. I think that this should improve with conservative management. We discussed the need to increase fiber intake to 20-30 g of fiber per day and increase water intake. She did not need any stool softeners because she says her bowels are normal. I think that some topical lidocaine may be beneficial for the acute exacerbation and prior to bowel movements but otherwise she says that she is not having any discomfort from this. She denies any bleeding or other symptoms and I would reserve surgical treatment for failure of medical management. We will go ahead and proceed with this plan and she will followup with me in 4-6 weeks if she has persistent symptoms.    Plan    Increase fiber to 20-30 g per day and increase water intake Topical lidocaine jelly when necessary for  discomfort       [name] DAVID 12/26/2012, 11:04 AM

## 2013-01-09 ENCOUNTER — Encounter (HOSPITAL_COMMUNITY): Payer: Self-pay | Admitting: Emergency Medicine

## 2013-01-09 ENCOUNTER — Emergency Department (HOSPITAL_COMMUNITY)
Admission: EM | Admit: 2013-01-09 | Discharge: 2013-01-10 | Disposition: A | Discharge status: Home / Self Care | Payer: Commercial Managed Care - PPO | Attending: Emergency Medicine | Admitting: Emergency Medicine

## 2013-01-09 DIAGNOSIS — F17200 Nicotine dependence, unspecified, uncomplicated: Secondary | ICD-10-CM | POA: Insufficient documentation

## 2013-01-09 DIAGNOSIS — Z79899 Other long term (current) drug therapy: Secondary | ICD-10-CM | POA: Insufficient documentation

## 2013-01-09 DIAGNOSIS — L299 Pruritus, unspecified: Secondary | ICD-10-CM | POA: Insufficient documentation

## 2013-01-09 DIAGNOSIS — L509 Urticaria, unspecified: Secondary | ICD-10-CM | POA: Insufficient documentation

## 2013-01-09 DIAGNOSIS — Z8709 Personal history of other diseases of the respiratory system: Secondary | ICD-10-CM | POA: Insufficient documentation

## 2013-01-09 NOTE — ED Notes (Signed)
Pt states she started with a rash on Tuesday or Wednesday and has progressively gotten worse  Pt states she has been using cortisone cream and taking benadryl without relief  Pt states it burns and itches

## 2013-01-09 NOTE — ED Provider Notes (Signed)
History    This chart was scribed for non-physician practitioner working with Hanley Seamen, MD by Frederik Pear, ED Scribe. This patient was seen in room WA05/WA05 and the patient's care was started at 2353.   CSN: [id number]  Arrival date & time 01/09/13  2337   First MD Initiated Contact with Patient 01/09/13 2353      Chief Complaint  Patient presents with  . Rash    (Consider location/radiation/quality/duration/timing/severity/associated sxs/prior treatment) The history is provided by the patient and medical records. No language interpreter was used.    [name] is a 57 y.o. female who presents to the Emergency Department complaining of sudden onset, gradually worsening, constant, itchy, burning rash to her bilateral arms that began 4 days ago near her wrists and has since spread up her arms. She reports that she did yard work and washed her dog with a new shampoo 7 days ago. She states that she is highly allergic to poison ivy, but states that the current rash is not like her previous episodes of poison ivy. She reports that she has treated the rash with 2 Benadryl tablets and cortisone cream without relief.   Past Medical History  Diagnosis Date  . Bronchitis     Past Surgical History  Procedure Laterality Date  . Abdominal hysterectomy  1986    Family History  Problem Relation Age of Onset  . Asthma Mother   . Alzheimer's disease Mother   . Hypertension Mother   . Cancer Maternal Aunt   . Cancer Paternal Aunt     Breast    History  Substance Use Topics  . Smoking status: Current Every Day Smoker -- 1.00 packs/day    Types: Cigarettes  . Smokeless tobacco: Never Used  . Alcohol Use: 0.6 oz/week    1 Shots of liquor per week     Comment: Occasional.    OB History   Grav Para Term Preterm Abortions TAB SAB Ect Mult Living                  Review of Systems  All other systems reviewed and are negative.    Allergies  Aspirin; Glycopyrrolate;  Lactose intolerance (gi); and Morphine sulfate  Home Medications   Current Outpatient Rx  Name  Route  Sig  Dispense  Refill  . albuterol (PROVENTIL HFA;VENTOLIN HFA) 108 (90 BASE) MCG/ACT inhaler   Inhalation   Inhale 2 puffs into the lungs every 6 (six) hours as needed. For shortness of breath.         . Eszopiclone (ESZOPICLONE) 3 MG TABS   Oral   Take 1 tablet (3 mg total) by mouth at bedtime. Take immediately before bedtime   60 tablet   3   . ibuprofen (ADVIL,MOTRIN) 200 MG tablet   Oral   Take 400 mg by mouth every 8 (eight) hours as needed. For pain.         Marland Kitchen PARoxetine (PAXIL) 20 MG tablet   Oral   Take 1 tablet (20 mg total) by mouth every morning.   90 tablet   4   . VIVELLE-DOT 0.1 MG/24HR   Transdermal   Place 1 patch onto the skin 2 (two) times a week.          . hydrOXYzine (ATARAX/VISTARIL) 25 MG tablet   Oral   Take 1 tablet (25 mg total) by mouth every 6 (six) hours.   12 tablet   0   . loratadine (CLARITIN) 10  MG tablet   Oral   Take 1 tablet (10 mg total) by mouth daily.   14 tablet   0   . predniSONE (DELTASONE) 10 MG tablet   Oral   Take 2 tablets (20 mg total) by mouth daily.   21 tablet   0     6 tabs on day 1, 5 tabs on day 2, 4 tabs on day 3, ...     BP 119/68  Pulse 70  Temp(Src) 97.7 F (36.5 C) (Oral)  Resp 18  SpO2 100%  Physical Exam  Nursing note and vitals reviewed. Constitutional: She is oriented to person, place, and time. She appears well-developed and well-nourished. No distress.  HENT:  Head: Normocephalic and atraumatic.  Eyes: EOM are normal. Pupils are equal, round, and reactive to light.  Neck: Normal range of motion. Neck supple. No tracheal deviation present.  Cardiovascular: Normal rate.   Pulmonary/Chest: Effort normal. No respiratory distress.  Abdominal: Soft. She exhibits no distension.  Musculoskeletal: Normal range of motion. She exhibits no edema.  Neurological: She is alert and oriented  to person, place, and time.  Skin: Skin is warm and dry.     Psychiatric: She has a normal mood and affect. Her behavior is normal.    ED Course  Procedures (including critical care time)  DIAGNOSTIC STUDIES: Oxygen Saturation is 100% on room air, normal by my interpretation.    COORDINATION OF CARE:  23:59- Discussed planned course of treatment with the patient, including Solumedrol, Atarax, Prednisone, and Claritin,  who is agreeable at this time.  Labs Reviewed - No data to display No results found.   1. Urticaria     MDM  Pt has been advised of the symptoms that warrant their return to the ED. Patient has voiced understanding and has agreed to follow-up with the PCP or specialist.  I personally performed the services described in this documentation, which was scribed in my presence. The recorded information has been reviewed and is accurate.       Dorthula Matas, PA-C 01/10/13 0005

## 2013-01-10 MED ORDER — HYDROXYZINE HCL 25 MG PO TABS: 25.0000 mg | ORAL_TABLET | Freq: Four times a day (QID) | ORAL | Status: DC

## 2013-01-10 MED ORDER — LORATADINE 10 MG PO TABS: 10.0000 mg | ORAL_TABLET | Freq: Every day | ORAL | Status: DC

## 2013-01-10 MED ORDER — PREDNISONE 20 MG PO TABS
40.0000 mg | ORAL_TABLET | Freq: Once | ORAL | Status: AC
  Administered 2013-01-10: 40 mg via ORAL
  Filled 2013-01-10: qty 2

## 2013-01-10 MED ORDER — PREDNISONE 10 MG PO TABS: 20.0000 mg | ORAL_TABLET | Freq: Every day | ORAL | Status: DC

## 2013-01-10 NOTE — ED Notes (Signed)
Pt verbalizes understanding 

## 2013-01-10 NOTE — ED Provider Notes (Signed)
Medical screening examination/treatment/procedure(s) were performed by non-physician practitioner and as supervising physician I was immediately available for consultation/collaboration.   Hervey Wedig L Maricel Swartzendruber, MD 01/10/13 0612 

## 2013-01-20 ENCOUNTER — Telehealth: Payer: Self-pay | Admitting: Internal Medicine

## 2013-01-20 ENCOUNTER — Emergency Department (HOSPITAL_COMMUNITY)
Admission: EM | Admit: 2013-01-20 | Discharge: 2013-01-20 | Disposition: A | Discharge status: Home / Self Care | Payer: Commercial Managed Care - PPO | Attending: Emergency Medicine | Admitting: Emergency Medicine

## 2013-01-20 ENCOUNTER — Encounter (HOSPITAL_COMMUNITY): Payer: Self-pay | Admitting: *Deleted

## 2013-01-20 ENCOUNTER — Emergency Department (HOSPITAL_COMMUNITY): Payer: Commercial Managed Care - PPO

## 2013-01-20 DIAGNOSIS — Z8709 Personal history of other diseases of the respiratory system: Secondary | ICD-10-CM | POA: Insufficient documentation

## 2013-01-20 DIAGNOSIS — R911 Solitary pulmonary nodule: Secondary | ICD-10-CM | POA: Insufficient documentation

## 2013-01-20 DIAGNOSIS — Z79899 Other long term (current) drug therapy: Secondary | ICD-10-CM | POA: Insufficient documentation

## 2013-01-20 DIAGNOSIS — M549 Dorsalgia, unspecified: Secondary | ICD-10-CM | POA: Insufficient documentation

## 2013-01-20 DIAGNOSIS — F17200 Nicotine dependence, unspecified, uncomplicated: Secondary | ICD-10-CM | POA: Insufficient documentation

## 2013-01-20 DIAGNOSIS — R0789 Other chest pain: Secondary | ICD-10-CM

## 2013-01-20 DIAGNOSIS — R0602 Shortness of breath: Secondary | ICD-10-CM | POA: Insufficient documentation

## 2013-01-20 LAB — BASIC METABOLIC PANEL
BUN: 19 mg/dL (ref 6–23)
CO2: 24 mEq/L (ref 19–32)
Calcium: 9.5 mg/dL (ref 8.4–10.5)
Chloride: 102 mEq/L (ref 96–112)
Creatinine, Ser: 0.8 mg/dL (ref 0.50–1.10)
GFR calc Af Amer: >90 mL/min (ref >90)
GFR calc non Af Amer: 80 mL/min — ABNORMAL LOW (ref >90)
Glucose, Bld: 113 mg/dL — ABNORMAL HIGH (ref 70–99)
Potassium: 3.7 mEq/L (ref 3.5–5.1)
Sodium: 137 mEq/L (ref 135–145)

## 2013-01-20 LAB — CBC
HCT: 43.4 % (ref 36.0–46.0)
Hemoglobin: 15.1 g/dL — ABNORMAL HIGH (ref 12.0–15.0)
MCH: 31.2 pg (ref 26.0–34.0)
MCHC: 34.8 g/dL (ref 30.0–36.0)
MCV: 89.7 fL (ref 78.0–100.0)
Platelets: 254 10*3/uL (ref 150–400)
RBC: 4.84 MIL/uL (ref 3.87–5.11)
RDW: 12.9 % (ref 11.5–15.5)
WBC: 6.8 10*3/uL (ref 4.0–10.5)

## 2013-01-20 LAB — POCT I-STAT TROPONIN I: Troponin i, poc: 0 ng/mL (ref 0.00–0.08)

## 2013-01-20 MED ORDER — GI COCKTAIL ~~LOC~~
30.0000 mL | Freq: Once | ORAL | Status: AC
  Administered 2013-01-20: 30 mL via ORAL
  Filled 2013-01-20: qty 30

## 2013-01-20 MED ORDER — PANTOPRAZOLE SODIUM 20 MG PO TBEC: 20.0000 mg | DELAYED_RELEASE_TABLET | Freq: Every day | ORAL | Status: DC

## 2013-01-20 MED ORDER — IOHEXOL 350 MG/ML SOLN
100.0000 mL | Freq: Once | INTRAVENOUS | Status: AC | PRN
  Administered 2013-01-20: 100 mL via INTRAVENOUS

## 2013-01-20 MED ORDER — SUCRALFATE 1 GM/10ML PO SUSP: 1.0000 g | Freq: Four times a day (QID) | ORAL | Status: DC

## 2013-01-20 NOTE — ED Provider Notes (Signed)
History     CSN: [id number]  Arrival date & time 01/20/13  1513   First MD Initiated Contact with Patient 01/20/13 1652      Chief Complaint  Patient presents with  . Chest Pain  . Shortness of Breath    (Consider location/radiation/quality/duration/timing/severity/associated sxs/prior treatment) HPI Comments: Patient comes to the ER for evaluation of chest pain. Pain is just to the left of Center and has been present for one week. Pain has been continuous. It is a burning sensation associated with nausea. It worsens when she eats. She has had mild shortness of breath. Pain radiates into the back directly opposite the area of anterior pain. She denies injury. There has been no fever. There is no cough.  Patient is a 57 y.o. female presenting with chest pain and shortness of breath.  Chest Pain Associated symptoms: back pain and shortness of breath   Shortness of Breath Associated symptoms: chest pain     Past Medical History  Diagnosis Date  . Bronchitis     Past Surgical History  Procedure Laterality Date  . Abdominal hysterectomy  1986    Family History  Problem Relation Age of Onset  . Asthma Mother   . Alzheimer's disease Mother   . Hypertension Mother   . Cancer Maternal Aunt   . Cancer Paternal Aunt     Breast    History  Substance Use Topics  . Smoking status: Current Every Day Smoker -- 1.00 packs/day    Types: Cigarettes  . Smokeless tobacco: Never Used  . Alcohol Use: 0.6 oz/week    1 Shots of liquor per week     Comment: Occasional.    OB History   Grav Para Term Preterm Abortions TAB SAB Ect Mult Living                  Review of Systems  Respiratory: Positive for shortness of breath.   Cardiovascular: Positive for chest pain.  Musculoskeletal: Positive for back pain.  All other systems reviewed and are negative.    Allergies  Aspirin; Glycopyrrolate; Lactose intolerance (gi); and Morphine sulfate  Home Medications   Current  Outpatient Rx  Name  Route  Sig  Dispense  Refill  . albuterol (PROVENTIL HFA;VENTOLIN HFA) 108 (90 BASE) MCG/ACT inhaler   Inhalation   Inhale 2 puffs into the lungs every 6 (six) hours as needed for shortness of breath.          . ALPRAZolam (XANAX) 0.25 MG tablet   Oral   Take 0.25 mg by mouth at bedtime as needed for sleep.         Marland Kitchen estradiol (VIVELLE-DOT) 0.1 MG/24HR   Transdermal   Place 1 patch onto the skin 2 (two) times a week. Swapped out on Sundays and Wednesdays.         . Eszopiclone (ESZOPICLONE) 3 MG TABS   Oral   Take 1 tablet (3 mg total) by mouth at bedtime. Take immediately before bedtime   60 tablet   3   . ibuprofen (ADVIL,MOTRIN) 200 MG tablet   Oral   Take 400 mg by mouth every 8 (eight) hours as needed for pain.          Marland Kitchen loratadine (CLARITIN) 10 MG tablet   Oral   Take 10 mg by mouth at bedtime.         Marland Kitchen PARoxetine (PAXIL) 20 MG tablet   Oral   Take 20 mg by mouth daily.  BP 111/56  Pulse 68  Temp(Src) 98.6 F (37 C) (Oral)  Resp 16  SpO2 98%  Physical Exam  Constitutional: She is oriented to person, place, and time. She appears well-developed and well-nourished. No distress.  HENT:  Head: Normocephalic and atraumatic.  Right Ear: Hearing normal.  Nose: Nose normal.  Mouth/Throat: Oropharynx is clear and moist and mucous membranes are normal.  Eyes: Conjunctivae and EOM are normal. Pupils are equal, round, and reactive to light.  Neck: Normal range of motion. Neck supple.  Cardiovascular: Normal rate, regular rhythm, S1 normal and S2 normal.  Exam reveals no gallop and no friction rub.   No murmur heard. Pulmonary/Chest: Effort normal and breath sounds normal. No respiratory distress. She exhibits no tenderness.  Abdominal: Soft. Normal appearance and bowel sounds are normal. There is no hepatosplenomegaly. There is no tenderness. There is no rebound, no guarding, no tenderness at McBurney's point and negative  Murphy's sign. No hernia.  Musculoskeletal: Normal range of motion.  Neurological: She is alert and oriented to person, place, and time. She has normal strength. No cranial nerve deficit or sensory deficit. Coordination normal. GCS eye subscore is 4. GCS verbal subscore is 5. GCS motor subscore is 6.  Skin: Skin is warm, dry and intact. No rash noted. No cyanosis.  Psychiatric: She has a normal mood and affect. Her speech is normal and behavior is normal. Thought content normal.    ED Course  Procedures (including critical care time)  EKG:  Date: 01/20/2013  Rate: 71  Rhythm: normal sinus rhythm  QRS Axis: normal  Intervals: normal  ST/T Wave abnormalities: normal  Conduction Disutrbances:none  Narrative Interpretation:   Old EKG Reviewed: unchanged    Labs Reviewed  CBC - Abnormal; Notable for the following:    Hemoglobin 15.1 (*)    All other components within normal limits  BASIC METABOLIC PANEL - Abnormal; Notable for the following:    Glucose, Bld 113 (*)    GFR calc non Af Amer 80 (*)    All other components within normal limits  POCT I-STAT TROPONIN I   Ct Angio Chest Pe W/cm &/or Wo Cm  01/20/2013  *RADIOLOGY REPORT*  Clinical Data: Chest pain and shortness of breath  CT ANGIOGRAPHY CHEST  Technique:  Multidetector CT imaging of the chest using the standard protocol during bolus administration of intravenous contrast. Multiplanar reconstructed images including MIPs were obtained and reviewed to evaluate the vascular anatomy.  Contrast: OMNIPAQUE IOHEXOL 350 MG/ML SOLN  Comparison: None.  Findings: There is no pleural effusion.  Mild centrilobular emphysema noted.  Left upper lobe pulmonary nodule measures 7 mm, image 58/series 9.  No airspace consolidation or atelectasis.  The main pulmonary artery is patent.  There are no abnormal filling defects within the lobar or segmental pulmonary arteries to suggest clinically significant pulmonary embolus.  Heart size is  normal.  There is no pericardial effusion.  No enlarged mediastinal or hilar lymph nodes.  Limited imaging through the upper abdomen shows no acute findings.  There is no supraclavicular or axillary adenopathy.  Review of the visualized bony structures is significant for mild thoracic spondylosis.  IMPRESSION:  1.  No evidence for acute pulmonary embolus. 2.  Left upper lobe nodule measures 7 mm. If the patient is at high risk for bronchogenic carcinoma, follow-up chest CT at 3-6 months is recommended.  If the patient is at low risk for bronchogenic carcinoma, follow-up chest CT at 6-12 months is recommended.  This  recommendation follows the consensus statement: Guidelines for Management of Small Pulmonary Nodules Detected on CT Scans: A Statement from the Fleischner Society as published in Radiology 2005; 237:395-400.   Original Report Authenticated By: Signa Kell, M.D.      Diagnosis: 1. Atypical chest pain, likely GI in nature 2. Esophagitis/gastritis 3. Lung nodule    MDM  Patient comes to the ER for evaluation of chest pain. She has had continuous pain for one week. The fact that her EKG was unremarkable and her troponin was negative is very reassuring after this length of time of pain. She has not worsened by exertion but does worsen with eating and has nausea associated with the symptoms. She has a gallbladder, but there is no tenderness in the right upper quadrant. LFTs were normal. I do not suspect acute cholecystitis at this time. Because she had some radiation of the pain into her back, CT angiography was performed. No PE and no aortic abnormality noted. She did have a lung nodule. This was discussed with the patient. As she is a smoker, she will require repeat CT in 3-6 months. She can follow up with Dr. Amador Cunas for this. She has an appointment at 10 AM tomorrow morning with him. Will treat with carafate/protonix.        Gilda Crease, MD 01/20/13 1900

## 2013-01-20 NOTE — Telephone Encounter (Addendum)
Pt does not have any INS. Pt will make payment arrangement. Pt is sch for appt tomorrow for gerd

## 2013-01-20 NOTE — Telephone Encounter (Signed)
Patient Information:  Caller Name: [name]  Phone: [phone]  Patient: [name]  Gender: Female  DOB: [birth year]  Age: 57 Years  PCP: [facility])  Office Follow Up:  Does the office need to follow up with this patient?: No  Instructions For The Office: N/A  RN Note:  Is currently in [facility] working.  Will have someone take her to ER.  Symptoms  Reason For Call & Symptoms: Has had heartburn for a week, constant pain, sometimes worse than otherwise.  Some nausea, burping at times.  Firm feeling in stomach area like it has an air bubble.  Pain from collargone to mid-breastline, does not feel like typical burn that she used to  have.  Pain gradually getting worse over the last few days.  Feels like something caught in throat but knows that there is not anything there. Lot of stress - caring for mother with Alzheimers, lost job in December, loss of income, husband left in January. Is smoker.  Reviewed Health History In EMR: Yes  Reviewed Medications In EMR: Yes  Reviewed Allergies In EMR: Yes  Reviewed Surgeries / Procedures: Yes  Date of Onset of Symptoms: 01/13/2013  Treatments Tried: Zegrid, Tums, Gaviscon  Treatments Tried Worked: No  Guideline(s) Used:  Acid Indigestion, Heartburn, and Sour Stomach  Chest Pain  Disposition Per Guideline:   Call EMS 911 Now  Reason For Disposition Reached:   Chest pain lasting longer than 5 minutes and ANY of the following:  Over 62 years old Over 48 years old and at least one cardiac risk factor (i.e., high blood pressure, diabetes, high cholesterol, obesity, smoker or strong family history of heart disease) Pain is crushing, pressure-like, or heavy  Took nitroglycerin and chest pain was not relieved History of heart disease (i.e., angina, heart attack, bypass surgery, angioplasty, CHF)  Advice Given:  N/A  Patient Will Follow Care Advice:  YES

## 2013-01-20 NOTE — ED Notes (Signed)
Pt c/o central chest pain reports pain radiates to back, also c/o SOB. Reports she is a smoker, denies cardiac hx.

## 2013-01-21 ENCOUNTER — Ambulatory Visit: Payer: BC Managed Care – PPO | Admitting: Internal Medicine

## 2013-01-21 DIAGNOSIS — Z0289 Encounter for other administrative examinations: Secondary | ICD-10-CM

## 2013-02-10 ENCOUNTER — Encounter: Payer: Self-pay | Admitting: Internal Medicine

## 2013-02-10 ENCOUNTER — Ambulatory Visit (INDEPENDENT_AMBULATORY_CARE_PROVIDER_SITE_OTHER): Payer: Commercial Managed Care - PPO | Admitting: Internal Medicine

## 2013-02-10 VITALS — BP 110/60 | HR 70 | Temp 97.7°F | Resp 16 | Wt 127.0 lb

## 2013-02-10 DIAGNOSIS — K219 Gastro-esophageal reflux disease without esophagitis: Secondary | ICD-10-CM

## 2013-02-10 DIAGNOSIS — B1710 Acute hepatitis C without hepatic coma: Secondary | ICD-10-CM

## 2013-02-10 NOTE — Progress Notes (Signed)
Subjective:    Patient ID: [name], female    DOB: [birth year], 57 y.o.   MRN: [id number]  HPI  Wt Readings from Last 3 Encounters:  02/10/13 127 lb (57.607 kg)  12/26/12 120 lb 3.2 oz (54.522 kg)  10/14/12 116 lb (52.6 kg)    57 year old patient who is seen today in followup. She was seen in the ED recently with atypical chest pain and has been treated for gastric esophageal reflux disease. She has been on PPI therapy as well as antacids and a brief course of Carafate. She complains of bloating belching and abdominal distention. She has her little true heartburn symptoms. She states that she has a history of lactose intolerance but has not changed her diet. She has gained 11 pounds since the beginning of the year. Associated symptoms include some nausea but no vomiting. She states she has daily normal bowel movements. \ Past Medical History  Diagnosis Date  . Bronchitis     History   Social History  . Marital Status: Married    Spouse Name: N/A    Number of Children: N/A  . Years of Education: N/A   Occupational History  . Not on file.   Social History Main Topics  . Smoking status: Current Every Day Smoker -- 1.00 packs/day    Types: Cigarettes  . Smokeless tobacco: Never Used  . Alcohol Use: 0.6 oz/week    1 Shots of liquor per week     Comment: Occasional.  . Drug Use: No  . Sexually Active: Not on file   Other Topics Concern  . Not on file   Social History Narrative  . No narrative on file    Past Surgical History  Procedure Laterality Date  . Abdominal hysterectomy  1986    Family History  Problem Relation Age of Onset  . Asthma Mother   . Alzheimer's disease Mother   . Hypertension Mother   . Cancer Maternal Aunt   . Cancer Paternal Aunt     Breast    Allergies  Allergen Reactions  . Aspirin     REACTION: unspecified  . Glycopyrrolate     REACTION: unspecified  . Lactose Intolerance (Gi) Other (See Comments)    Milk only.Patient  states that she can eat cheese.  Marland Kitchen Morphine Sulfate     REACTION: unspecified    Current Outpatient Prescriptions on File Prior to Visit  Medication Sig Dispense Refill  . albuterol (PROVENTIL HFA;VENTOLIN HFA) 108 (90 BASE) MCG/ACT inhaler Inhale 2 puffs into the lungs every 6 (six) hours as needed for shortness of breath.       . ALPRAZolam (XANAX) 0.25 MG tablet Take 0.25 mg by mouth at bedtime as needed for sleep.      Marland Kitchen estradiol (VIVELLE-DOT) 0.1 MG/24HR Place 1 patch onto the skin 2 (two) times a week. Swapped out on Sundays and Wednesdays.      . Eszopiclone (ESZOPICLONE) 3 MG TABS Take 1 tablet (3 mg total) by mouth at bedtime. Take immediately before bedtime  60 tablet  3  . ibuprofen (ADVIL,MOTRIN) 200 MG tablet Take 400 mg by mouth every 8 (eight) hours as needed for pain.       . pantoprazole (PROTONIX) 20 MG tablet Take 1 tablet (20 mg total) by mouth daily.  30 tablet  0  . PARoxetine (PAXIL) 20 MG tablet Take 20 mg by mouth daily.       No current facility-administered medications on file prior to visit.  BP 110/60  Pulse 70  Temp(Src) 97.7 F (36.5 C) (Oral)  Resp 16  Wt 127 lb (57.607 kg)  BMI 21.13 kg/m2  SpO2 98%     Review of Systems  Constitutional: Negative.   HENT: Negative for hearing loss, congestion, sore throat, rhinorrhea, dental problem, sinus pressure and tinnitus.   Eyes: Negative for pain, discharge and visual disturbance.  Respiratory: Negative for cough and shortness of breath.   Cardiovascular: Negative for chest pain, palpitations and leg swelling.  Gastrointestinal: Positive for nausea and abdominal distention. Negative for vomiting, abdominal pain, diarrhea, constipation and blood in stool.  Genitourinary: Negative for dysuria, urgency, frequency, hematuria, flank pain, vaginal bleeding, vaginal discharge, difficulty urinating, vaginal pain and pelvic pain.  Musculoskeletal: Negative for joint swelling, arthralgias and gait problem.   Skin: Negative for rash.  Neurological: Negative for dizziness, syncope, speech difficulty, weakness, numbness and headaches.  Hematological: Negative for adenopathy.  Psychiatric/Behavioral: Negative for behavioral problems, dysphoric mood and agitation. The patient is not nervous/anxious.        Objective:   Physical Exam  Constitutional: She is oriented to person, place, and time. She appears well-developed and well-nourished. No distress.  HENT:  Head: Normocephalic.  Right Ear: External ear normal.  Left Ear: External ear normal.  Mouth/Throat: Oropharynx is clear and moist.  Eyes: Conjunctivae and EOM are normal. Pupils are equal, round, and reactive to light.  Neck: Normal range of motion. Neck supple. No thyromegaly present.  Cardiovascular: Normal rate, regular rhythm, normal heart sounds and intact distal pulses.   Pulmonary/Chest: Effort normal and breath sounds normal.  Abdominal: Soft. Bowel sounds are normal. She exhibits no distension and no mass. There is no tenderness. There is no rebound and no guarding.  Mild bloating Active bowel sounds  Musculoskeletal: Normal range of motion.  Lymphadenopathy:    She has no cervical adenopathy.  Neurological: She is alert and oriented to person, place, and time.  Skin: Skin is warm and dry. No rash noted.  Psychiatric: She has a normal mood and affect. Her behavior is normal.          Assessment & Plan:   Abdominal bloating and mild dyspepsia. We'll continue an anti-reflex regimen. Dietary instructions dispensed. Will continue on a lactose free diet. Weight loss exercise encouraged

## 2013-02-10 NOTE — Patient Instructions (Signed)
It is important that you exercise regularly, at least 20 minutes 3 to 4 times per week.  If you develop chest pain or shortness of breath seek  medical attention.  You need to lose weight.  Consider a lower calorie diet and regular exercise.  Diet for Gastroesophageal Reflux Disease, Adult Reflux (acid reflux) is when acid from your stomach flows up into the esophagus. When acid comes in contact with the esophagus, the acid causes irritation and soreness (inflammation) in the esophagus. When reflux happens often or so severely that it causes damage to the esophagus, it is called gastroesophageal reflux disease (GERD). Nutrition therapy can help ease the discomfort of GERD. FOODS OR DRINKS TO AVOID OR LIMIT  Smoking or chewing tobacco. Nicotine is one of the most potent stimulants to acid production in the gastrointestinal tract.  Caffeinated and decaffeinated coffee and black tea.  Regular or low-calorie carbonated beverages or energy drinks (caffeine-free carbonated beverages are allowed).   Strong spices, such as black pepper, white pepper, red pepper, cayenne, curry powder, and chili powder.  Peppermint or spearmint.  Chocolate.  High-fat foods, including meats and fried foods. Extra added fats including oils, butter, salad dressings, and nuts. Limit these to less than 8 tsp per day.  Fruits and vegetables if they are not tolerated, such as citrus fruits or tomatoes.  Alcohol.  Any food that seems to aggravate your condition. If you have questions regarding your diet, call your caregiver or a registered dietitian. OTHER THINGS THAT MAY HELP GERD INCLUDE:   Eating your meals slowly, in a relaxed setting.  Eating 5 to 6 small meals per day instead of 3 large meals.  Eliminating food for a period of time if it causes distress.  Not lying down until 3 hours after eating a meal.  Keeping the head of your bed raised 6 to 9 inches (15 to 23 cm) by using a foam wedge or blocks  under the legs of the bed. Lying flat may make symptoms worse.  Being physically active. Weight loss may be helpful in reducing reflux in overweight or obese adults.  Wear loose fitting clothing EXAMPLE MEAL PLAN This meal plan is approximately 2,000 calories based on https://www.bernard.org/ meal planning guidelines. Breakfast   cup cooked oatmeal.  1 cup strawberries.  1 cup low-fat milk.  1 oz almonds. Snack  1 cup cucumber slices.  6 oz yogurt (made from low-fat or fat-free milk). Lunch  2 slice whole-wheat bread.  2 oz sliced [place].  2 tsp mayonnaise.  1 cup blueberries.  1 cup snap peas. Snack  6 whole-wheat crackers.  1 oz string cheese. Dinner   cup brown rice.  1 cup mixed veggies.  1 tsp olive oil.  3 oz grilled fish. Document Released: 09/17/2005 Document Revised: 12/10/2011 Document Reviewed: 08/03/2011 [facility] Patient Information 2013 [place], [place]. Gastroesophageal Reflux Disease, Adult Gastroesophageal reflux disease (GERD) happens when acid from your stomach flows up into the esophagus. When acid comes in contact with the esophagus, the acid causes soreness (inflammation) in the esophagus. Over time, GERD may create small holes (ulcers) in the lining of the esophagus. CAUSES   Increased body weight. This puts pressure on the stomach, making acid rise from the stomach into the esophagus.  Smoking. This increases acid production in the stomach.  Drinking alcohol. This causes decreased pressure in the lower esophageal sphincter (valve or ring of muscle between the esophagus and stomach), allowing acid from the stomach into the esophagus.  Late evening  meals and a full stomach. This increases pressure and acid production in the stomach.  A malformed lower esophageal sphincter. Sometimes, no cause is found. SYMPTOMS   Burning pain in the lower part of the mid-chest behind the breastbone and in the mid-stomach area. This may occur twice a week  or more often.  Trouble swallowing.  Sore throat.  Dry cough.  Asthma-like symptoms including chest tightness, shortness of breath, or wheezing. DIAGNOSIS  Your caregiver may be able to diagnose GERD based on your symptoms. In some cases, X-rays and other tests may be done to check for complications or to check the condition of your stomach and esophagus. TREATMENT  Your caregiver may recommend over-the-counter or prescription medicines to help decrease acid production. Ask your caregiver before starting or adding any new medicines.  HOME CARE INSTRUCTIONS   Change the factors that you can control. Ask your caregiver for guidance concerning weight loss, quitting smoking, and alcohol consumption.  Avoid foods and drinks that make your symptoms worse, such as:  Caffeine or alcoholic drinks.  Chocolate.  Peppermint or mint flavorings.  Garlic and onions.  Spicy foods.  Citrus fruits, such as oranges, lemons, or limes.  Tomato-based foods such as sauce, chili, salsa, and pizza.  Fried and fatty foods.  Avoid lying down for the 3 hours prior to your bedtime or prior to taking a nap.  Eat small, frequent meals instead of large meals.  Wear loose-fitting clothing. Do not wear anything tight around your waist that causes pressure on your stomach.  Raise the head of your bed 6 to 8 inches with wood blocks to help you sleep. Extra pillows will not help.  Only take over-the-counter or prescription medicines for pain, discomfort, or fever as directed by your caregiver.  Do not take aspirin, ibuprofen, or other nonsteroidal anti-inflammatory drugs (NSAIDs). SEEK IMMEDIATE MEDICAL CARE IF:   You have pain in your arms, neck, jaw, teeth, or back.  Your pain increases or changes in intensity or duration.  You develop nausea, vomiting, or sweating (diaphoresis).  You develop shortness of breath, or you faint.  Your vomit is green, yellow, black, or looks like coffee grounds or  blood.  Your stool is red, bloody, or black. These symptoms could be signs of other problems, such as heart disease, gastric bleeding, or esophageal bleeding. MAKE SURE YOU:   Understand these instructions.  Will watch your condition.  Will get help right away if you are not doing well or get worse. Document Released: 06/27/2005 Document Revised: 12/10/2011 Document Reviewed: 04/06/2011 [facility] Patient Information 2013 [place], [place].

## 2013-04-10 ENCOUNTER — Encounter: Payer: Self-pay | Admitting: Family Medicine

## 2013-04-10 ENCOUNTER — Encounter: Payer: Self-pay | Admitting: Internal Medicine

## 2013-04-10 ENCOUNTER — Ambulatory Visit (INDEPENDENT_AMBULATORY_CARE_PROVIDER_SITE_OTHER): Payer: Commercial Managed Care - PPO | Admitting: Family Medicine

## 2013-04-10 VITALS — BP 108/70 | Temp 98.3°F | Wt 124.0 lb

## 2013-04-10 DIAGNOSIS — L0293 Carbuncle, unspecified: Secondary | ICD-10-CM

## 2013-04-10 DIAGNOSIS — L0292 Furuncle, unspecified: Secondary | ICD-10-CM

## 2013-04-10 MED ORDER — DOXYCYCLINE HYCLATE 100 MG PO TABS: 100.0000 mg | ORAL_TABLET | Freq: Two times a day (BID) | ORAL | Status: DC

## 2013-04-10 NOTE — Patient Instructions (Signed)
-  start antibiotic today -As we discussed, we have prescribed a new medication for you at this appointment. We discussed the common and serious potential adverse effects of this medication and you can review these and more with the pharmacist when you pick up your medication.  Please follow the instructions for use carefully and notify us immediately if you have any problems taking this medication.  -keep area clean and dry  -follow up in 1 week or sooner if worsening or other concerns

## 2013-04-10 NOTE — Progress Notes (Signed)
No chief complaint on file.   HPI:  Acute visit for boil: -started a week ago - worse over last few days -L bikini area -has hx of "pimples" in bikini area - has used erythromycin gel for these, tried this on this and didn't help -denies: fevers, chills, drainage, swelling or redness surrounding this area  ROS: See pertinent positives and negatives per HPI.  Past Medical History  Diagnosis Date  . Bronchitis     Family History  Problem Relation Age of Onset  . Asthma Mother   . Alzheimer's disease Mother   . Hypertension Mother   . Cancer Maternal Aunt   . Cancer Paternal Aunt     Breast    History   Social History  . Marital Status: Married    Spouse Name: N/A    Number of Children: N/A  . Years of Education: N/A   Social History Main Topics  . Smoking status: Current Every Day Smoker -- 1.00 packs/day    Types: Cigarettes  . Smokeless tobacco: Never Used  . Alcohol Use: 0.6 oz/week    1 Shots of liquor per week     Comment: Occasional.  . Drug Use: No  . Sexually Active: None   Other Topics Concern  . None   Social History Narrative  . None    Current outpatient prescriptions:albuterol (PROVENTIL HFA;VENTOLIN HFA) 108 (90 BASE) MCG/ACT inhaler, Inhale 2 puffs into the lungs every 6 (six) hours as needed for shortness of breath. , Disp: , Rfl: ;  ALPRAZolam (XANAX) 0.25 MG tablet, Take 0.25 mg by mouth at bedtime as needed for sleep., Disp: , Rfl: ;  estradiol (VIVELLE-DOT) 0.1 MG/24HR, Place 1 patch onto the skin 2 (two) times a week. Swapped out on Sundays and Wednesdays., Disp: , Rfl:  Eszopiclone (ESZOPICLONE) 3 MG TABS, Take 1 tablet (3 mg total) by mouth at bedtime. Take immediately before bedtime, Disp: 60 tablet, Rfl: 3;  ibuprofen (ADVIL,MOTRIN) 200 MG tablet, Take 400 mg by mouth every 8 (eight) hours as needed for pain. , Disp: , Rfl: ;  pantoprazole (PROTONIX) 20 MG tablet, Take 1 tablet (20 mg total) by mouth daily., Disp: 30 tablet, Rfl: 0;   PARoxetine (PAXIL) 20 MG tablet, Take 20 mg by mouth daily., Disp: , Rfl:  doxycycline (VIBRA-TABS) 100 MG tablet, Take 1 tablet (100 mg total) by mouth 2 (two) times daily., Disp: 20 tablet, Rfl: 0  EXAM:  Filed Vitals:   04/10/13 1455  BP: 108/70  Temp: 98.3 F (36.8 C)    Body mass index is 20.63 kg/(m^2).  GENERAL: vitals reviewed and listed above, alert, oriented, appears well hydrated and in no acute distress  HEENT: atraumatic, conjunttiva clear, no obvious abnormalities on inspection of external nose and ears  NECK: no obvious masses on inspection  LUNGS: clear to auscultation bilaterally, no wheezes, rales or rhonchi, good air movement  SKIN: small boil tender L groin, no fluctuance or abscess  PSYCH: pleasant and cooperative, no obvious depression or anxiety  ASSESSMENT AND PLAN:  Discussed the following assessment and plan:  Boil - Plan: doxycycline (VIBRA-TABS) 100 MG tablet  -recurrent folliculitis, topical abx not working, will do 10 days of doxy - risks discussed, follow up in 1 week -Patient advised to return or notify a doctor immediately if symptoms worsen or persist or new concerns arise.  Patient Instructions  -start antibiotic today -As we discussed, we have prescribed a new medication for you at this appointment. We discussed the  common and serious potential adverse effects of this medication and you can review these and more with the pharmacist when you pick up your medication.  Please follow the instructions for use carefully and notify us immediately if you have any problems taking this medication.  -keep area clean and dry  -follow up in 1 week or sooner if worsening or other concerns       KIM, HANNAH R.

## 2013-04-26 ENCOUNTER — Other Ambulatory Visit: Payer: Self-pay | Admitting: Internal Medicine

## 2013-04-28 ENCOUNTER — Telehealth: Payer: Self-pay | Admitting: Internal Medicine

## 2013-04-28 NOTE — Telephone Encounter (Signed)
Pt notified Rx was faxed to [facility] in [place]. Pt verbalized understanding and said that is correct pharmacy.

## 2013-04-28 NOTE — Telephone Encounter (Signed)
PT called to request a 2 month refill of her Eszopiclone 3 MG TABS. She states that the [facility] never received the RX. Please re-send. Thank you!

## 2013-06-23 ENCOUNTER — Telehealth: Payer: Self-pay | Admitting: Internal Medicine

## 2013-06-23 MED ORDER — PROPRANOLOL HCL ER 60 MG PO CP24: 60.0000 mg | ORAL_CAPSULE | Freq: Every day | ORAL | Status: DC

## 2013-06-23 NOTE — Telephone Encounter (Signed)
Propranolol extended release 60 mg #90 one daily

## 2013-06-23 NOTE — Telephone Encounter (Signed)
Patient notified to pick up at the pharmacy. 

## 2013-06-23 NOTE — Telephone Encounter (Signed)
Pt needs new rx inderal ?mg for tremors call into [facility] in [place]

## 2013-06-23 NOTE — Telephone Encounter (Signed)
Please advise I do not see medication on list.

## 2013-08-06 ENCOUNTER — Other Ambulatory Visit: Payer: Self-pay

## 2013-09-07 ENCOUNTER — Other Ambulatory Visit: Payer: Self-pay | Admitting: Internal Medicine

## 2013-10-08 ENCOUNTER — Other Ambulatory Visit: Payer: Self-pay | Admitting: Internal Medicine

## 2013-11-04 ENCOUNTER — Other Ambulatory Visit: Payer: Self-pay | Admitting: Internal Medicine

## 2014-01-04 ENCOUNTER — Other Ambulatory Visit: Payer: Self-pay | Admitting: Internal Medicine

## 2014-01-21 ENCOUNTER — Ambulatory Visit (INDEPENDENT_AMBULATORY_CARE_PROVIDER_SITE_OTHER): Payer: Commercial Managed Care - PPO | Admitting: Internal Medicine

## 2014-01-21 ENCOUNTER — Encounter: Payer: Self-pay | Admitting: Internal Medicine

## 2014-01-21 VITALS — BP 118/74 | HR 70 | Temp 98.1°F | Resp 20 | Ht 65.0 in | Wt 132.0 lb

## 2014-01-21 DIAGNOSIS — K219 Gastro-esophageal reflux disease without esophagitis: Secondary | ICD-10-CM

## 2014-01-21 DIAGNOSIS — R251 Tremor, unspecified: Secondary | ICD-10-CM

## 2014-01-21 DIAGNOSIS — R259 Unspecified abnormal involuntary movements: Secondary | ICD-10-CM

## 2014-01-21 DIAGNOSIS — F329 Major depressive disorder, single episode, unspecified: Secondary | ICD-10-CM

## 2014-01-21 DIAGNOSIS — R51 Headache: Secondary | ICD-10-CM

## 2014-01-21 DIAGNOSIS — F3289 Other specified depressive episodes: Secondary | ICD-10-CM

## 2014-01-21 DIAGNOSIS — F17200 Nicotine dependence, unspecified, uncomplicated: Secondary | ICD-10-CM

## 2014-01-21 DIAGNOSIS — Z23 Encounter for immunization: Secondary | ICD-10-CM

## 2014-01-21 HISTORY — DX: Nicotine dependence, unspecified, uncomplicated: F17.200

## 2014-01-21 MED ORDER — PANTOPRAZOLE SODIUM 20 MG PO TBEC: 20.0000 mg | DELAYED_RELEASE_TABLET | Freq: Two times a day (BID) | ORAL | Status: DC

## 2014-01-21 MED ORDER — METOCLOPRAMIDE HCL 5 MG PO TABS: 5.0000 mg | ORAL_TABLET | Freq: Three times a day (TID) | ORAL | Status: DC

## 2014-01-21 MED ORDER — ESZOPICLONE 3 MG PO TABS: ORAL_TABLET | ORAL | Status: DC

## 2014-01-21 NOTE — Progress Notes (Signed)
Subjective:    Patient ID: [name], female    DOB: 1956-09-21, 58 y.o.   MRN: [id number]  HPI  58 year old patient who has not been seen in over one year.  She has a number of concerns today For the past several weeks.  She is said to worsening reflux symptoms.  This has been a chronic problem in the past.  She describes increased situational stress.  Her in for a elderly mother, who has advanced dementia.  She has family members, but does not feel she gets enough assistance.  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  She does have a history of depression She has been on propranolol for an essential tremor She also states that she had a recent urgent care visit due to a severe migraine lasting 3 days. She has been on Paxil for depression for some time.  She has recently started a smoking cessation program associated with [place]  Past Medical History  Diagnosis Date  . Bronchitis     History   Social History  . Marital Status: Married    Spouse Name: N/A    Number of Children: N/A  . Years of Education: N/A   Occupational History  . Not on file.   Social History Main Topics  . Smoking status: Current Every Day Smoker -- 1.00 packs/day    Types: Cigarettes  . Smokeless tobacco: Never Used  . Alcohol Use: 0.6 oz/week    1 Shots of liquor per week     Comment: Occasional.  . Drug Use: No  . Sexual Activity: Not on file   Other Topics Concern  . Not on file   Social History Narrative  . No narrative on file    Past Surgical History  Procedure Laterality Date  . Abdominal hysterectomy  1986    Family History  Problem Relation Age of Onset  . Asthma Mother   . Alzheimer's disease Mother   . Hypertension Mother   . Cancer Maternal Aunt   . Cancer Paternal Aunt     Breast    Allergies  Allergen Reactions  . Aspirin     REACTION: unspecified  . Glycopyrrolate     REACTION: unspecified  . Lactose  Intolerance (Gi) Other (See Comments)    Milk only.Patient states that she can eat cheese.  Marland Kitchen Morphine Sulfate     REACTION: unspecified    Current Outpatient Prescriptions on File Prior to Visit  Medication Sig Dispense Refill  . albuterol (PROVENTIL HFA;VENTOLIN HFA) 108 (90 BASE) MCG/ACT inhaler Inhale 2 puffs into the lungs every 6 (six) hours as needed for shortness of breath.       . Eszopiclone 3 MG TABS TAKE 1 TABLET BY MOUTH IMMEDIATELY BEFORE BEDTIME  60 tablet  2  . ibuprofen (ADVIL,MOTRIN) 200 MG tablet Take 400 mg by mouth every 8 (eight) hours as needed for pain.       . pantoprazole (PROTONIX) 20 MG tablet Take 1 tablet (20 mg total) by mouth daily.  30 tablet  0  . PARoxetine (PAXIL) 20 MG tablet TAKE ONE TABLET BY MOUTH ONE TIME DAILY.  90 tablet  0  . propranolol ER (INDERAL LA) 60 MG 24 hr capsule TAKE 1 CAPSULE BY MOUTH EVERY DAY  90 capsule  0   No current facility-administered medications on file prior to visit.    BP 118/74  Pulse 70  Temp(Src) 98.1 F (36.7  C) (Oral)  Resp 20  Ht 5\' 5"  (1.651 m)  Wt 132 lb (59.875 kg)  BMI 21.97 kg/m2  SpO2 98%       Review of Systems  Constitutional: Negative.   HENT: Negative for congestion, dental problem, hearing loss, rhinorrhea, sinus pressure, sore throat and tinnitus.   Eyes: Negative for pain, discharge and visual disturbance.  Respiratory: Negative for cough and shortness of breath.   Cardiovascular: Negative for chest pain, palpitations and leg swelling.  Gastrointestinal: Positive for abdominal pain and abdominal distention. Negative for nausea, vomiting, diarrhea, constipation and blood in stool.  Genitourinary: Negative for dysuria, urgency, frequency, hematuria, flank pain, vaginal bleeding, vaginal discharge, difficulty urinating, vaginal pain and pelvic pain.  Musculoskeletal: Negative for arthralgias, gait problem and joint swelling.  Skin: Negative for rash.  Neurological: Negative for dizziness,  syncope, speech difficulty, weakness, numbness and headaches.  Hematological: Negative for adenopathy.  Psychiatric/Behavioral: Positive for sleep disturbance and dysphoric mood. Negative for behavioral problems and agitation. The patient is nervous/anxious.        Objective:   Physical Exam  Constitutional: She is oriented to person, place, and time. She appears well-developed and well-nourished.  HENT:  Head: Normocephalic.  Right Ear: External ear normal.  Left Ear: External ear normal.  Mouth/Throat: Oropharynx is clear and moist.  Eyes: Conjunctivae and EOM are normal. Pupils are equal, round, and reactive to light.  Neck: Normal range of motion. Neck supple. No thyromegaly present.  Cardiovascular: Normal rate, regular rhythm, normal heart sounds and intact distal pulses.   Pulmonary/Chest: Effort normal and breath sounds normal.  Abdominal: Soft. Bowel sounds are normal. She exhibits distension. She exhibits no mass. There is no tenderness.  Mild bloating  Musculoskeletal: Normal range of motion.  Lymphadenopathy:    She has no cervical adenopathy.  Neurological: She is alert and oriented to person, place, and time.  Skin: Skin is warm and dry. No rash noted.  Psychiatric: She has a normal mood and affect. Her behavior is normal.  Anxious          Assessment & Plan:   Anxiety, depression.  Clearly , aggravating reflux symptoms.  Will refer to behavioral health Gastroesophageal reflux disease.  We'll continue PPI therapy and short-term Reglan and intensify antireflux regimen Migraine headaches.  Will give a trial of  Frova  Schedule CPX

## 2014-01-21 NOTE — Progress Notes (Signed)
Pre-visit discussion using our clinic review tool. No additional management support is needed unless otherwise documented below in the visit note.  

## 2014-01-21 NOTE — Progress Notes (Signed)
   Subjective:    Patient ID: [name], female    DOB: January 12, 1956, 58 y.o.   MRN: [id number]  HPI BP Readings from Last 3 Encounters:  01/21/14 118/74  04/10/13 108/70  02/10/13 110/60    Wt Readings from Last 3 Encounters:  01/21/14 132 lb (59.875 kg)  04/10/13 124 lb (56.246 kg)  02/10/13 127 lb (57.607 kg)     Review of Systems     Objective:   Physical Exam        Assessment & Plan:

## 2014-01-21 NOTE — Patient Instructions (Addendum)
Behavioral health referral as discussed  Avoids foods high in acid such as tomatoes citrus juices, and spicy foods.  Avoid eating within two hours of lying down or before exercising.  Do not overheat.  Try smaller more frequent meals.  If symptoms persist, elevate the head of her bed 12 inches while sleeping.  Return in 6 months for follow-up Migraine Headache A migraine headache is an intense, throbbing pain on one or both sides of your head. A migraine can last for 30 minutes to several hours. CAUSES  The exact cause of a migraine headache is not always known. However, a migraine may be caused when nerves in the brain become irritated and release chemicals that cause inflammation. This causes pain. Certain things may also trigger migraines, such as:  Alcohol.  Smoking.  Stress.  Menstruation.  Aged cheeses.  Foods or drinks that contain nitrates, glutamate, aspartame, or tyramine.  Lack of sleep.  Chocolate.  Caffeine.  Hunger.  Physical exertion.  Fatigue.  Medicines used to treat chest pain (nitroglycerine), birth control pills, estrogen, and some blood pressure medicines. SIGNS AND SYMPTOMS  Pain on one or both sides of your head.  Pulsating or throbbing pain.  Severe pain that prevents daily activities.  Pain that is aggravated by any physical activity.  Nausea, vomiting, or both.  Dizziness.  Pain with exposure to bright lights, loud noises, or activity.  General sensitivity to bright lights, loud noises, or smells. Before you get a migraine, you may get warning signs that a migraine is coming (aura). An aura may include:  Seeing flashing lights.  Seeing bright spots, halos, or zig-zag lines.  Having tunnel vision or blurred vision.  Having feelings of numbness or tingling.  Having trouble talking.  Having muscle weakness. DIAGNOSIS  A migraine headache is often diagnosed based on:  Symptoms.  Physical exam.  A CT scan or MRI of your  head. These imaging tests cannot diagnose migraines, but they can help rule out other causes of headaches. TREATMENT Medicines may be given for pain and nausea. Medicines can also be given to help prevent recurrent migraines.  HOME CARE INSTRUCTIONS  Only take over-the-counter or prescription medicines for pain or discomfort as directed by your health care provider. The use of long-term narcotics is not recommended.  Lie down in a dark, quiet room when you have a migraine.  Keep a journal to find out what may trigger your migraine headaches. For example, write down:  What you eat and drink.  How much sleep you get.  Any change to your diet or medicines.  Limit alcohol consumption.  Quit smoking if you smoke.  Get 7 9 hours of sleep, or as recommended by your health care provider.  Limit stress.  Keep lights dim if bright lights bother you and make your migraines worse. SEEK IMMEDIATE MEDICAL CARE IF:   Your migraine becomes severe.  You have a fever.  You have a stiff neck.  You have vision loss.  You have muscular weakness or loss of muscle control.  You start losing your balance or have trouble walking.  You feel faint or pass out.  You have severe symptoms that are different from your first symptoms. MAKE SURE YOU:   Understand these instructions.  Will watch your condition.  Will get help right away if you are not doing well or get worse. Document Released: 09/17/2005 Document Revised: 07/08/2013 Document Reviewed: 05/25/2013 [facility] Patient Information 2014 [place]. Gastroesophageal Reflux Disease, Adult Gastroesophageal reflux  disease (GERD) happens when acid from your stomach flows up into the esophagus. When acid comes in contact with the esophagus, the acid causes soreness (inflammation) in the esophagus. Over time, GERD may create small holes (ulcers) in the lining of the esophagus. CAUSES   Increased body weight. This puts pressure on the  stomach, making acid rise from the stomach into the esophagus.  Smoking. This increases acid production in the stomach.  Drinking alcohol. This causes decreased pressure in the lower esophageal sphincter (valve or ring of muscle between the esophagus and stomach), allowing acid from the stomach into the esophagus.  Late evening meals and a full stomach. This increases pressure and acid production in the stomach.  A malformed lower esophageal sphincter. Sometimes, no cause is found. SYMPTOMS   Burning pain in the lower part of the mid-chest behind the breastbone and in the mid-stomach area. This may occur twice a week or more often.  Trouble swallowing.  Sore throat.  Dry cough.  Asthma-like symptoms including chest tightness, shortness of breath, or wheezing. DIAGNOSIS  Your caregiver may be able to diagnose GERD based on your symptoms. In some cases, X-rays and other tests may be done to check for complications or to check the condition of your stomach and esophagus. TREATMENT  Your caregiver may recommend over-the-counter or prescription medicines to help decrease acid production. Ask your caregiver before starting or adding any new medicines.  HOME CARE INSTRUCTIONS   Change the factors that you can control. Ask your caregiver for guidance concerning weight loss, quitting smoking, and alcohol consumption.  Avoid foods and drinks that make your symptoms worse, such as:  Caffeine or alcoholic drinks.  Chocolate.  Peppermint or mint flavorings.  Garlic and onions.  Spicy foods.  Citrus fruits, such as oranges, lemons, or limes.  Tomato-based foods such as sauce, chili, salsa, and pizza.  Fried and fatty foods.  Avoid lying down for the 3 hours prior to your bedtime or prior to taking a nap.  Eat small, frequent meals instead of large meals.  Wear loose-fitting clothing. Do not wear anything tight around your waist that causes pressure on your stomach.  Raise the  head of your bed 6 to 8 inches with wood blocks to help you sleep. Extra pillows will not help.  Only take over-the-counter or prescription medicines for pain, discomfort, or fever as directed by your caregiver.  Do not take aspirin, ibuprofen, or other nonsteroidal anti-inflammatory drugs (NSAIDs). SEEK IMMEDIATE MEDICAL CARE IF:   You have pain in your arms, neck, jaw, teeth, or back.  Your pain increases or changes in intensity or duration.  You develop nausea, vomiting, or sweating (diaphoresis).  You develop shortness of breath, or you faint.  Your vomit is green, yellow, black, or looks like coffee grounds or blood.  Your stool is red, bloody, or black. These symptoms could be signs of other problems, such as heart disease, gastric bleeding, or esophageal bleeding. MAKE SURE YOU:   Understand these instructions.  Will watch your condition.  Will get help right away if you are not doing well or get worse. Document Released: 06/27/2005 Document Revised: 12/10/2011 Document Reviewed: 04/06/2011 [facility] Patient Information 2014 [place], [place].

## 2014-01-22 ENCOUNTER — Telehealth: Payer: Self-pay | Admitting: Internal Medicine

## 2014-01-22 NOTE — Telephone Encounter (Signed)
Relevant patient education assigned to patient using Emmi. ° °

## 2014-02-05 ENCOUNTER — Telehealth: Payer: Self-pay | Admitting: Internal Medicine

## 2014-02-05 MED ORDER — PANTOPRAZOLE SODIUM 40 MG PO TBEC: 40.0000 mg | DELAYED_RELEASE_TABLET | Freq: Every day | ORAL | Status: DC

## 2014-02-05 NOTE — Telephone Encounter (Signed)
New Rx sent to pharmacy

## 2014-02-05 NOTE — Telephone Encounter (Signed)
ok 

## 2014-02-05 NOTE — Telephone Encounter (Signed)
[facility] denied 60 per 30 days Pantoprazole.  Plan only allows 30/30 of the 20 mg.  The representative said the pt could try the Pantoprazole 40 mg 30/30 and it would go through.

## 2014-02-05 NOTE — Telephone Encounter (Signed)
Dr.K, okay to change Pantoprazole Rx to 40 mg one tablet daily instead of 20 mg twice a day due to insurance?

## 2014-02-13 ENCOUNTER — Other Ambulatory Visit: Payer: Self-pay | Admitting: Internal Medicine

## 2014-03-04 ENCOUNTER — Ambulatory Visit (INDEPENDENT_AMBULATORY_CARE_PROVIDER_SITE_OTHER): Payer: Commercial Managed Care - PPO | Admitting: Internal Medicine

## 2014-03-04 ENCOUNTER — Encounter: Payer: Self-pay | Admitting: Internal Medicine

## 2014-03-04 VITALS — BP 100/60 | HR 69 | Temp 98.5°F | Resp 18 | Ht 65.0 in | Wt 137.0 lb

## 2014-03-04 DIAGNOSIS — F329 Major depressive disorder, single episode, unspecified: Secondary | ICD-10-CM

## 2014-03-04 DIAGNOSIS — R1031 Right lower quadrant pain: Secondary | ICD-10-CM

## 2014-03-04 DIAGNOSIS — G8929 Other chronic pain: Secondary | ICD-10-CM

## 2014-03-04 DIAGNOSIS — K219 Gastro-esophageal reflux disease without esophagitis: Secondary | ICD-10-CM

## 2014-03-04 DIAGNOSIS — B1710 Acute hepatitis C without hepatic coma: Secondary | ICD-10-CM

## 2014-03-04 DIAGNOSIS — F17200 Nicotine dependence, unspecified, uncomplicated: Secondary | ICD-10-CM

## 2014-03-04 DIAGNOSIS — F3289 Other specified depressive episodes: Secondary | ICD-10-CM

## 2014-03-04 LAB — COMPREHENSIVE METABOLIC PANEL
ALT: 22 U/L (ref 0–35)
AST: 27 U/L (ref 0–37)
Albumin: 4.1 g/dL (ref 3.5–5.2)
Alkaline Phosphatase: 87 U/L (ref 39–117)
BUN: 20 mg/dL (ref 6–23)
CO2: 29 mEq/L (ref 19–32)
Calcium: 10.1 mg/dL (ref 8.4–10.5)
Chloride: 102 mEq/L (ref 96–112)
Creatinine, Ser: 0.8 mg/dL (ref 0.4–1.2)
GFR: 82.93 mL/min (ref >60.00)
Glucose, Bld: 91 mg/dL (ref 70–99)
Potassium: 4.8 mEq/L (ref 3.5–5.1)
Sodium: 137 mEq/L (ref 135–145)
Total Bilirubin: 0.8 mg/dL (ref 0.2–1.2)
Total Protein: 7.6 g/dL (ref 6.0–8.3)

## 2014-03-04 LAB — CBC WITH DIFFERENTIAL/PLATELET
Basophils Absolute: 0.1 10*3/uL (ref 0.0–0.1)
Basophils Relative: 0.6 % (ref 0.0–3.0)
Eosinophils Absolute: 0.2 10*3/uL (ref 0.0–0.7)
Eosinophils Relative: 2.8 % (ref 0.0–5.0)
HCT: 45.6 % (ref 36.0–46.0)
Hemoglobin: 15.3 g/dL — ABNORMAL HIGH (ref 12.0–15.0)
Lymphocytes Relative: 24 % (ref 12.0–46.0)
Lymphs Abs: 2 10*3/uL (ref 0.7–4.0)
MCHC: 33.6 g/dL (ref 30.0–36.0)
MCV: 89.4 fl (ref 78.0–100.0)
Monocytes Absolute: 1 10*3/uL (ref 0.1–1.0)
Monocytes Relative: 11.5 % (ref 3.0–12.0)
Neutro Abs: 5.2 10*3/uL (ref 1.4–7.7)
Neutrophils Relative %: 61.1 % (ref 43.0–77.0)
Platelets: 273 10*3/uL (ref 150.0–400.0)
RBC: 5.1 Mil/uL (ref 3.87–5.11)
RDW: 13.1 % (ref 11.5–15.5)
WBC: 8.4 10*3/uL (ref 4.0–10.5)

## 2014-03-04 LAB — SEDIMENTATION RATE: Sed Rate: 7 mm/hr (ref 0–22)

## 2014-03-04 MED ORDER — TRAMADOL HCL 50 MG PO TABS: 50.0000 mg | ORAL_TABLET | Freq: Three times a day (TID) | ORAL | Status: DC | PRN

## 2014-03-04 NOTE — Patient Instructions (Addendum)
GI followup as discussed  Smoking tobacco is very bad for your health. You should stop smoking immediately.  Abdominal ultrasound as scheduled  Return in 3 months for follow-up  Avoids foods high in acid such as tomatoes citrus juices, and spicy foods.  Avoid eating within two hours of lying down or before exercising.  Do not overheat.  Try smaller more frequent meals.  If symptoms persist, elevate the head of her bed 12 inches while sleeping.

## 2014-03-04 NOTE — Progress Notes (Signed)
Subjective:    Patient ID: [name], female    DOB: 09/20/1956, 58 y.o.   MRN: [id number]  HPI  58 year old patient who is seen today complaining of heartburn, epigastric and right upper quadrant pain.  The symptoms have been chronic and have been evaluated by GI in the past.  This has included abdominal ultrasound, abdominal CT scanning, as well as hepatobiliary nuclear scan. She does have a history of chronic hepatitis C.  She is aware of new drug treatments and wonders if she would be a candidate.  No recent screening She has a history of anxiety, depression.  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  She is in the process of placing in an extended care facility  Past Medical History  Diagnosis Date  . Bronchitis     History   Social History  . Marital Status: Married    Spouse Name: N/A    Number of Children: N/A  . Years of Education: N/A   Occupational History  . Not on file.   Social History Main Topics  . Smoking status: Current Every Day Smoker -- 1.00 packs/day    Types: Cigarettes  . Smokeless tobacco: Never Used  . Alcohol Use: 0.6 oz/week    1 Shots of liquor per week     Comment: Occasional.  . Drug Use: No  . Sexual Activity: Not on file   Other Topics Concern  . Not on file   Social History Narrative  . No narrative on file    Past Surgical History  Procedure Laterality Date  . Abdominal hysterectomy  1986    Family History  Problem Relation Age of Onset  . Asthma Mother   . Alzheimer's disease Mother   . Hypertension Mother   . Cancer Maternal Aunt   . Cancer Paternal Aunt     Breast    Allergies  Allergen Reactions  . Aspirin     REACTION: unspecified  . Glycopyrrolate     REACTION: unspecified  . Lactose Intolerance (Gi) Other (See Comments)    Milk only.Patient states that she can eat cheese.  Marland Kitchen Morphine Sulfate     REACTION: unspecified    Current Outpatient  Prescriptions on File Prior to Visit  Medication Sig Dispense Refill  . albuterol (PROVENTIL HFA;VENTOLIN HFA) 108 (90 BASE) MCG/ACT inhaler Inhale 2 puffs into the lungs every 6 (six) hours as needed for shortness of breath.       . Eszopiclone 3 MG TABS TAKE 1 TABLET BY MOUTH IMMEDIATELY BEFORE BEDTIME  60 tablet  2  . ibuprofen (ADVIL,MOTRIN) 200 MG tablet Take 400 mg by mouth every 8 (eight) hours as needed for pain.       Marland Kitchen metoCLOPramide (REGLAN) 5 MG tablet Take 1 tablet (5 mg total) by mouth 4 (four) times daily -  before meals and at bedtime.  40 tablet  1  . pantoprazole (PROTONIX) 40 MG tablet Take 1 tablet (40 mg total) by mouth daily.  90 tablet  3  . PARoxetine (PAXIL) 20 MG tablet TAKE 1 TABLET BY MOUTH DAILY  90 tablet  1  . propranolol ER (INDERAL LA) 60 MG 24 hr capsule TAKE 1 CAPSULE BY MOUTH EVERY DAY  90 capsule  0   No current facility-administered medications on file prior to visit.    BP 100/60  Pulse 69  Temp(Src) 98.5 F (36.9 C) (Oral)  Resp 18  Ht 5\' 5"  (  1.651 m)  Wt 137 lb (62.143 kg)  BMI 22.80 kg/m2  SpO2 98%       Review of Systems  Constitutional: Negative.   HENT: Negative for congestion, dental problem, hearing loss, rhinorrhea, sinus pressure, sore throat and tinnitus.   Eyes: Negative for pain, discharge and visual disturbance.  Respiratory: Negative for cough and shortness of breath.   Cardiovascular: Negative for chest pain, palpitations and leg swelling.  Gastrointestinal: Positive for abdominal pain and abdominal distention. Negative for nausea, vomiting, diarrhea, constipation and blood in stool.  Genitourinary: Negative for dysuria, urgency, frequency, hematuria, flank pain, vaginal bleeding, vaginal discharge, difficulty urinating, vaginal pain and pelvic pain.  Musculoskeletal: Negative for arthralgias, gait problem and joint swelling.  Skin: Negative for rash.  Neurological: Negative for dizziness, syncope, speech difficulty,  weakness, numbness and headaches.  Hematological: Negative for adenopathy.  Psychiatric/Behavioral: Negative for behavioral problems, dysphoric mood and agitation. The patient is not nervous/anxious.        Objective:   Physical Exam  Constitutional: She is oriented to person, place, and time. She appears well-developed and well-nourished.  HENT:  Head: Normocephalic.  Right Ear: External ear normal.  Left Ear: External ear normal.  Mouth/Throat: Oropharynx is clear and moist.  Eyes: Conjunctivae and EOM are normal. Pupils are equal, round, and reactive to light.  Neck: Normal range of motion. Neck supple. No thyromegaly present.  Cardiovascular: Normal rate, regular rhythm, normal heart sounds and intact distal pulses.   Pulmonary/Chest: Effort normal and breath sounds normal.  Abdominal: Soft. Bowel sounds are normal. She exhibits no mass. There is no tenderness. There is no rebound and no guarding.  Appears mildly bloated.  No organomegaly, or significant tenderness  Musculoskeletal: Normal range of motion.  Lymphadenopathy:    She has no cervical adenopathy.  Neurological: She is alert and oriented to person, place, and time.  Skin: Skin is warm and dry. No rash noted.  Psychiatric: She has a normal mood and affect. Her behavior is normal.          Assessment & Plan:   Chronic abdominal pain History of chronic hepatitis C.  We'll check updated labs and screening.  Will refer to GI for followup chronic hepatitis C Anxiety depression.  Also, will improve when her mother is placed in an extended care facility  We'll review screening labs including LFTs and AFP.  We'll check abdominal ultrasound

## 2014-03-04 NOTE — Progress Notes (Signed)
Pre-visit discussion using our clinic review tool. No additional management support is needed unless otherwise documented below in the visit note.  

## 2014-03-05 ENCOUNTER — Telehealth: Payer: Self-pay | Admitting: Gastroenterology

## 2014-03-05 LAB — AFP TUMOR MARKER: AFP-Tumor Marker: 8.9 ng/mL — ABNORMAL HIGH (ref 0.0–8.0)

## 2014-03-05 NOTE — Telephone Encounter (Signed)
Dr Marthann Schiller office was notified that we do not treat Hep she was offered the numbers to infectious control or the Hep c clinic.  She will let Dr Raliegh Ip know I spoke to the pt and she wants to be seen for reflux, I scheduled an appt for 05/10/14 to discuss.  She will wait for a call from Dr Raliegh Ip for the Hep C

## 2014-03-08 ENCOUNTER — Telehealth: Payer: Self-pay | Admitting: Internal Medicine

## 2014-03-08 NOTE — Telephone Encounter (Signed)
Calling to report patient has been scheduled to see Roosevelt Locks, NP at the [facility] on 03/24/14 at 10:15.

## 2014-03-09 ENCOUNTER — Telehealth: Payer: Self-pay | Admitting: Internal Medicine

## 2014-03-09 NOTE — Telephone Encounter (Signed)
Pt is requesting results from labs that were done last week.

## 2014-03-09 NOTE — Telephone Encounter (Signed)
Spoke to pt gave her lab results from 6/4. Pt verbalized understanding and is scheduled to see Hepatitis C clinic on 6/24.

## 2014-03-11 ENCOUNTER — Ambulatory Visit
Admission: RE | Admit: 2014-03-11 | Discharge: 2014-03-11 | Disposition: A | Discharge status: Home / Self Care | Payer: Commercial Managed Care - PPO | Source: Ambulatory Visit | Attending: Internal Medicine | Admitting: Internal Medicine

## 2014-03-11 DIAGNOSIS — B1710 Acute hepatitis C without hepatic coma: Secondary | ICD-10-CM

## 2014-03-11 DIAGNOSIS — G8929 Other chronic pain: Secondary | ICD-10-CM

## 2014-03-11 DIAGNOSIS — R1031 Right lower quadrant pain: Secondary | ICD-10-CM

## 2014-04-06 ENCOUNTER — Other Ambulatory Visit: Payer: Self-pay | Admitting: Internal Medicine

## 2014-04-26 ENCOUNTER — Encounter: Payer: Self-pay | Admitting: Physician Assistant

## 2014-04-26 ENCOUNTER — Ambulatory Visit (INDEPENDENT_AMBULATORY_CARE_PROVIDER_SITE_OTHER): Payer: Commercial Managed Care - PPO | Admitting: Physician Assistant

## 2014-04-26 VITALS — BP 122/82 | HR 83 | Temp 98.5°F | Resp 18 | Wt 138.0 lb

## 2014-04-26 DIAGNOSIS — R300 Dysuria: Secondary | ICD-10-CM

## 2014-04-26 DIAGNOSIS — N390 Urinary tract infection, site not specified: Secondary | ICD-10-CM

## 2014-04-26 DIAGNOSIS — J0100 Acute maxillary sinusitis, unspecified: Secondary | ICD-10-CM

## 2014-04-26 LAB — POCT URINALYSIS DIPSTICK
Bilirubin, UA: NEGATIVE
Glucose, UA: NEGATIVE
Ketones, UA: NEGATIVE
Nitrite, UA: NEGATIVE
Protein, UA: NEGATIVE
Spec Grav, UA: 1.02
Urobilinogen, UA: 0.2
pH, UA: 5.5

## 2014-04-26 MED ORDER — LEVOFLOXACIN 500 MG PO TABS: 500.0000 mg | ORAL_TABLET | Freq: Every day | ORAL | Status: DC

## 2014-04-26 NOTE — Progress Notes (Signed)
Subjective:    Patient ID: [name], female    DOB: Jul 22, 1956, 58 y.o.   MRN: [id number]  Cough This is a new problem. The current episode started 1 to 4 weeks ago (2 weeks. Saw an UC 2 weeks ago, was given a Zpak and prednisone, no improvement. ). The problem has been gradually worsening. The problem occurs constantly. The cough is productive of sputum (clear or white). Associated symptoms include ear pain (resolved), headaches, nasal congestion, postnasal drip, a sore throat (resolved), shortness of breath and wheezing. Pertinent negatives include no chest pain, chills, ear congestion, fever, heartburn, hemoptysis, myalgias, rash, rhinorrhea, sweats or weight loss. The symptoms are aggravated by lying down (lying flat, heat/humidity outside makes worse). Treatments tried: albuterol inhaler, coricidin. The treatment provided no relief. Her past medical history is significant for asthma and environmental allergies. There is no history of COPD.     Pt also states that she has been experiencing dysuria, frequency and urgency during the same time frame. She believes this is a UTI. Symptoms consistent with previous UTIs. She has not tried anything for this.    Review of Systems  Constitutional: Negative for fever, chills and weight loss.  HENT: Positive for ear pain (resolved), postnasal drip, sinus pressure and sore throat (resolved). Negative for rhinorrhea.   Respiratory: Positive for cough, shortness of breath and wheezing. Negative for hemoptysis.   Cardiovascular: Negative for chest pain.  Gastrointestinal: Negative for heartburn, nausea, vomiting, abdominal pain and diarrhea.  Musculoskeletal: Negative for myalgias.  Skin: Negative for rash.  Allergic/Immunologic: Positive for environmental allergies.  Neurological: Positive for headaches. Negative for syncope.  All other systems reviewed and are negative.     Past Medical History  Diagnosis Date  . Bronchitis     History    Social History  . Marital Status: Married    Spouse Name: N/A    Number of Children: N/A  . Years of Education: N/A   Occupational History  . Not on file.   Social History Main Topics  . Smoking status: Current Every Day Smoker -- 1.00 packs/day    Types: Cigarettes  . Smokeless tobacco: Never Used  . Alcohol Use: 0.6 oz/week    1 Shots of liquor per week     Comment: Occasional.  . Drug Use: No  . Sexual Activity: Not on file   Other Topics Concern  . Not on file   Social History Narrative  . No narrative on file    Past Surgical History  Procedure Laterality Date  . Abdominal hysterectomy  1986    Family History  Problem Relation Age of Onset  . Asthma Mother   . Alzheimer's disease Mother   . Hypertension Mother   . Cancer Maternal Aunt   . Cancer Paternal Aunt     Breast    Allergies  Allergen Reactions  . Aspirin     REACTION: unspecified  . Glycopyrrolate     REACTION: unspecified  . Lactose Intolerance (Gi) Other (See Comments)    Milk only.Patient states that she can eat cheese.  Marland Kitchen Morphine Sulfate     REACTION: unspecified    Current Outpatient Prescriptions on File Prior to Visit  Medication Sig Dispense Refill  . albuterol (PROVENTIL HFA;VENTOLIN HFA) 108 (90 BASE) MCG/ACT inhaler Inhale 2 puffs into the lungs every 6 (six) hours as needed for shortness of breath.       . Eszopiclone 3 MG TABS TAKE 1 TABLET BY MOUTH IMMEDIATELY BEFORE  BEDTIME  60 tablet  2  . ibuprofen (ADVIL,MOTRIN) 200 MG tablet Take 400 mg by mouth every 8 (eight) hours as needed for pain.       Marland Kitchen metoCLOPramide (REGLAN) 5 MG tablet Take 1 tablet (5 mg total) by mouth 4 (four) times daily -  before meals and at bedtime.  40 tablet  1  . pantoprazole (PROTONIX) 40 MG tablet Take 1 tablet (40 mg total) by mouth daily.  90 tablet  3  . PARoxetine (PAXIL) 20 MG tablet TAKE 1 TABLET BY MOUTH DAILY  90 tablet  1  . propranolol ER (INDERAL LA) 60 MG 24 hr capsule TAKE 1 CAPSULE  BY MOUTH DAILY  90 capsule  0  . traMADol (ULTRAM) 50 MG tablet Take 1 tablet (50 mg total) by mouth every 8 (eight) hours as needed.  90 tablet  0   No current facility-administered medications on file prior to visit.    EXAM: BP 122/82  Pulse 83  Temp(Src) 98.5 F (36.9 C) (Oral)  Resp 18  Wt 138 lb (62.596 kg)  SpO2 96%     Objective:   Physical Exam  Nursing note and vitals reviewed. Constitutional: She is oriented to person, place, and time. She appears well-developed and well-nourished. No distress.  HENT:  Head: Normocephalic and atraumatic.  Right Ear: External ear normal.  Left Ear: External ear normal.  Nose: Nose normal.  Mouth/Throat: No oropharyngeal exudate.  Oropharynx is slightly erythematous, no exudate. Bilateral TMs normal. Bilateral frontal sinuses non-ttp. Left Max sinus ttp. Right max sinus non-ttp.  Eyes: Conjunctivae and EOM are normal. Pupils are equal, round, and reactive to light.  Neck: Normal range of motion. Neck supple.  Cardiovascular: Normal rate, regular rhythm and intact distal pulses.   Pulmonary/Chest: Effort normal and breath sounds normal. No stridor. No respiratory distress. She has no wheezes. She has no rales. She exhibits no tenderness.  Musculoskeletal: Normal range of motion.  Lymphadenopathy:    She has no cervical adenopathy.  Neurological: She is alert and oriented to person, place, and time.  Skin: Skin is warm and dry. No rash noted. She is not diaphoretic. No erythema. No pallor.  Psychiatric: She has a normal mood and affect. Her behavior is normal. Judgment and thought content normal.     Lab Results  Component Value Date   WBC 8.4 03/04/2014   HGB 15.3* 03/04/2014   HCT 45.6 03/04/2014   PLT 273.0 03/04/2014   GLUCOSE 91 03/04/2014   CHOL 194 10/07/2012   TRIG 132.0 10/07/2012   HDL 42.60 10/07/2012   LDLCALC 125* 10/07/2012   ALT 22 03/04/2014   AST 27 03/04/2014   NA 137 03/04/2014   K 4.8 03/04/2014   CL 102 03/04/2014    CREATININE 0.8 03/04/2014   BUN 20 03/04/2014   CO2 29 03/04/2014   TSH 2.56 10/07/2012   INR 0.8 10/10/2007       Assessment & Plan:  [name] was seen today for cough.  Diagnoses and associated orders for this visit:  Dysuria Comments: UA positive for blood and leuks. Will obtain culture. - POCT urinalysis dipstick; Standing - POCT urinalysis dipstick - Culture, Urine - levofloxacin (LEVAQUIN) 500 MG tablet; Take 1 tablet (500 mg total) by mouth daily.  UTI (lower urinary tract infection) Comments: Will treat due to symptoms, will use levaquin to cross cover due to sinus infection. - levofloxacin (LEVAQUIN) 500 MG tablet; Take 1 tablet (500 mg total) by mouth daily.  Acute maxillary sinusitis, recurrence not specified Comments: Unilateral sinus tenderness. No help with Zpak, will retreat with Levaquin due to UTI. - levofloxacin (LEVAQUIN) 500 MG tablet; Take 1 tablet (500 mg total) by mouth daily.    Normal breath sounds on exam, will have pt continue to use albuterol inhaler as directed for any wheezing symptoms that may develop.   Return precautions provided, and patient handout on UTI and Sinusitis.  Plan to follow up as needed, or for worsening or persistent symptoms despite treatment.  Patient Instructions  We will call with the culture results if a change in therapy is required.  Levaquin daily for 10 days for both sinus infection and UTI.  Plain Over the Counter Mucinex (NOT Mucinex D) for thick secretions  Force NON dairy fluids, drinking plenty of water is best.    Over the Counter Flonase OR Nasacort AQ 1 spray in each nostril twice a day as needed. Use the "crossover" technique into opposite nostril spraying toward opposite ear @ 45 degree angle, not straight up into nostril.   Plain Over the Counter Allegra (NOT D )  160 daily , OR Loratidine 10 mg , OR Zyrtec 10 mg @ bedtime  as needed for itchy eyes & sneezing.  Saline Irrigation and Saline Sprays can also help  reduce symptoms.  If emergency symptoms discussed during visit developed, seek medical attention immediately.  Followup as needed, or for worsening or persistent symptoms despite treatment.

## 2014-04-26 NOTE — Progress Notes (Signed)
Pre visit review using our clinic review tool, if applicable. No additional management support is needed unless otherwise documented below in the visit note. 

## 2014-04-26 NOTE — Patient Instructions (Addendum)
We will call with the culture results if a change in therapy is required.  Levaquin daily for 10 days for both sinus infection and UTI.  Plain Over the Counter Mucinex (NOT Mucinex D) for thick secretions  Force NON dairy fluids, drinking plenty of water is best.    Over the Counter Flonase OR Nasacort AQ 1 spray in each nostril twice a day as needed. Use the "crossover" technique into opposite nostril spraying toward opposite ear @ 45 degree angle, not straight up into nostril.   Plain Over the Counter Allegra (NOT D )  160 daily , OR Loratidine 10 mg , OR Zyrtec 10 mg @ bedtime  as needed for itchy eyes & sneezing.  Saline Irrigation and Saline Sprays can also help reduce symptoms.  If emergency symptoms discussed during visit developed, seek medical attention immediately.  Followup as needed, or for worsening or persistent symptoms despite treatment.    Sinusitis Sinusitis is redness, soreness, and puffiness (inflammation) of the air pockets in the bones of your face (sinuses). The redness, soreness, and puffiness can cause air and mucus to get trapped in your sinuses. This can allow germs to grow and cause an infection.  HOME CARE   Drink enough fluids to keep your pee (urine) clear or pale yellow.  Use a humidifier in your home.  Run a hot shower to create steam in the bathroom. Sit in the bathroom with the door closed. Breathe in the steam 3-4 times a day.  Put a warm, moist washcloth on your face 3-4 times a day, or as told by your doctor.  Use salt water sprays (saline sprays) to wet the thick fluid in your nose. This can help the sinuses drain.  Only take medicine as told by your doctor. GET HELP RIGHT AWAY IF:   Your pain gets worse.  You have very bad headaches.  You are sick to your stomach (nauseous).  You throw up (vomit).  You are very sleepy (drowsy) all the time.  Your face is puffy (swollen).  Your vision changes.  You have a stiff neck.  You  have trouble breathing. MAKE SURE YOU:   Understand these instructions.  Will watch your condition.  Will get help right away if you are not doing well or get worse. Document Released: 03/05/2008 Document Revised: 06/11/2012 Document Reviewed: 04/22/2012 [facility] Patient Information 2015 [place], [place]. This information is not intended to replace advice given to you by your health care provider. Make sure you discuss any questions you have with your health care provider. Urinary Tract Infection A urinary tract infection (UTI) can occur any place along the urinary tract. The tract includes the kidneys, ureters, bladder, and urethra. A type of germ called bacteria often causes a UTI. UTIs are often helped with antibiotic medicine.  HOME CARE   If given, take antibiotics as told by your doctor. Finish them even if you start to feel better.  Drink enough fluids to keep your pee (urine) clear or pale yellow.  Avoid tea, drinks with caffeine, and bubbly (carbonated) drinks.  Pee often. Avoid holding your pee in for a long time.  Pee before and after having sex (intercourse).  Wipe from front to back after you poop (bowel movement) if you are a woman. Use each tissue only once. GET HELP RIGHT AWAY IF:   You have back pain.  You have lower belly (abdominal) pain.  You have chills.  You feel sick to your stomach (nauseous).  You throw  up (vomit).  Your burning or discomfort with peeing does not go away.  You have a fever.  Your symptoms are not better in 3 days. MAKE SURE YOU:   Understand these instructions.  Will watch your condition.  Will get help right away if you are not doing well or get worse. Document Released: 03/05/2008 Document Revised: 06/11/2012 Document Reviewed: 04/17/2012 [facility] Patient Information 2015 [place], [place]. This information is not intended to replace advice given to you by your health care provider. Make sure you discuss any questions you have  with your health care provider.

## 2014-04-28 LAB — URINE CULTURE
Colony Count: NO GROWTH
Organism ID, Bacteria: NO GROWTH

## 2014-04-30 ENCOUNTER — Encounter: Payer: Self-pay | Admitting: Physician Assistant

## 2014-04-30 ENCOUNTER — Ambulatory Visit (INDEPENDENT_AMBULATORY_CARE_PROVIDER_SITE_OTHER): Payer: Commercial Managed Care - PPO | Admitting: Physician Assistant

## 2014-04-30 VITALS — BP 110/80 | HR 72 | Temp 98.2°F | Resp 18 | Wt 140.0 lb

## 2014-04-30 DIAGNOSIS — J0100 Acute maxillary sinusitis, unspecified: Secondary | ICD-10-CM

## 2014-04-30 DIAGNOSIS — R319 Hematuria, unspecified: Secondary | ICD-10-CM

## 2014-04-30 DIAGNOSIS — R300 Dysuria: Secondary | ICD-10-CM

## 2014-04-30 DIAGNOSIS — N390 Urinary tract infection, site not specified: Secondary | ICD-10-CM

## 2014-04-30 LAB — POCT URINALYSIS DIPSTICK
Bilirubin, UA: NEGATIVE
Glucose, UA: NEGATIVE
Ketones, UA: NEGATIVE
Nitrite, UA: NEGATIVE
Protein, UA: NEGATIVE
Spec Grav, UA: 1.015
Urobilinogen, UA: 0.2
pH, UA: 5.5

## 2014-04-30 MED ORDER — PHENAZOPYRIDINE HCL 200 MG PO TABS: 200.0000 mg | ORAL_TABLET | Freq: Three times a day (TID) | ORAL | Status: DC | PRN

## 2014-04-30 NOTE — Patient Instructions (Addendum)
Pyridium 3 times daily as needed for pain.  Continue Levaquin until full course is completed.  We are referring you to urology to be evaluated as your UTI appears to be improving, however your dysuria is worsening.  Push fluids.  If emergency symptoms discussed during visit developed, seek medical attention immediately.  Followup as needed, or for worsening or persistent symptoms despite treatment.   Dysuria Dysuria is the medical term for pain with urination. There are many causes for dysuria, but urinary tract infection is the most common. If a urinalysis was performed it can show that there is a urinary tract infection. A urine culture confirms that you or your child is sick. You will need to follow up with a healthcare provider because:  If a urine culture was done you will need to know the culture results and treatment recommendations.  If the urine culture was positive, you or your child will need to be put on antibiotics or know if the antibiotics prescribed are the right antibiotics for your urinary tract infection.  If the urine culture is negative (no urinary tract infection), then other causes may need to be explored or antibiotics need to be stopped. Today laboratory work may have been done and there does not seem to be an infection. If cultures were done they will take at least 24 to 48 hours to be completed. Today x-rays may have been taken and they read as normal. No cause can be found for the problems. The x-rays may be re-read by a radiologist and you will be contacted if additional findings are made. You or your child may have been put on medications to help with this problem until you can see your primary caregiver. If the problems get better, see your primary caregiver if the problems return. If you were given antibiotics (medications which kill germs), take all of the mediations as directed for the full course of treatment.  If laboratory work was done, you need to find  the results. Leave a telephone number where you can be reached. If this is not possible, make sure you find out how you are to get test results. HOME CARE INSTRUCTIONS   Drink lots of fluids. For adults, drink eight, 8 ounce glasses of clear juice or water a day. For children, replace fluids as suggested by your caregiver.  Empty the bladder often. Avoid holding urine for long periods of time.  After a bowel movement, women should cleanse front to back, using each tissue only once.  Empty your bladder before and after sexual intercourse.  Take all the medicine given to you until it is gone. You may feel better in a few days, but TAKE ALL MEDICINE.  Avoid caffeine, tea, alcohol and carbonated beverages, because they tend to irritate the bladder.  In men, alcohol may irritate the prostate.  Only take over-the-counter or prescription medicines for pain, discomfort, or fever as directed by your caregiver.  If your caregiver has given you a follow-up appointment, it is very important to keep that appointment. Not keeping the appointment could result in a chronic or permanent injury, pain, and disability. If there is any problem keeping the appointment, you must call back to this facility for assistance. SEEK IMMEDIATE MEDICAL CARE IF:   Back pain develops.  A fever develops.  There is nausea (feeling sick to your stomach) or vomiting (throwing up).  Problems are no better with medications or are getting worse. MAKE SURE YOU:   Understand these instructions.  Will watch your condition.  Will get help right away if you are not doing well or get worse. Document Released: 06/15/2004 Document Revised: 12/10/2011 Document Reviewed: 04/22/2008 [facility] Patient Information 2015 [place], [place]. This information is not intended to replace advice given to you by your health care provider. Make sure you discuss any questions you have with your health care provider.

## 2014-04-30 NOTE — Progress Notes (Signed)
Pre visit review using our clinic review tool, if applicable. No additional management support is needed unless otherwise documented below in the visit note. 

## 2014-04-30 NOTE — Progress Notes (Signed)
Subjective:    Patient ID: [name], female    DOB: [birth year], 58 y.o.   MRN: [id number]  Dysuria  This is a new (has been seen recently for this and currently taking Levaquin.) problem. The current episode started in the past 7 days. The problem occurs every urination. The problem has been gradually worsening. The quality of the pain is described as stabbing, burning and aching. The pain is at a severity of 7/10. There has been no fever. Associated symptoms include frequency, hematuria (pinkish tint on toilet tissue.) and urgency. Pertinent negatives include no chills, discharge, flank pain, hesitancy, nausea, possible pregnancy, sweats or vomiting. She has tried antibiotics and increased fluids (levaquin 5/10) for the symptoms. Her past medical history is significant for kidney stones. There is no history of catheterization, recurrent UTIs, a single kidney, urinary stasis or a urological procedure.  Hematuria Irritative symptoms include frequency and urgency. Associated symptoms include dysuria. Pertinent negatives include no abdominal pain, chills, fever, flank pain, hesitancy, nausea or vomiting. Her past medical history is significant for kidney stones.    Patient is a 59 y.o. female presenting for follow up on sinusitis and UTI. States her Sinusitis seems to have resolved with levaquin. She tolerated this well and denies adverse effects to the medication. She is no longer having sinus symptoms.   Review of Systems  Constitutional: Negative for fever and chills.  Respiratory: Negative for shortness of breath.   Cardiovascular: Negative for chest pain.  Gastrointestinal: Negative for nausea, vomiting, abdominal pain and diarrhea.  Genitourinary: Positive for dysuria, urgency, frequency and hematuria (pinkish tint on toilet tissue.). Negative for hesitancy and flank pain.  Neurological: Negative for syncope and headaches.  All other systems reviewed and are negative.     Past  Medical History  Diagnosis Date  . Bronchitis     History   Social History  . Marital Status: Married    Spouse Name: N/A    Number of Children: N/A  . Years of Education: N/A   Occupational History  . Not on file.   Social History Main Topics  . Smoking status: Current Every Day Smoker -- 1.00 packs/day    Types: Cigarettes  . Smokeless tobacco: Never Used  . Alcohol Use: 0.6 oz/week    1 Shots of liquor per week     Comment: Occasional.  . Drug Use: No  . Sexual Activity: Not on file   Other Topics Concern  . Not on file   Social History Narrative  . No narrative on file    Past Surgical History  Procedure Laterality Date  . Abdominal hysterectomy  1986    Family History  Problem Relation Age of Onset  . Asthma Mother   . Alzheimer's disease Mother   . Hypertension Mother   . Cancer Maternal Aunt   . Cancer Paternal Aunt     Breast    Allergies  Allergen Reactions  . Aspirin     REACTION: unspecified  . Glycopyrrolate     REACTION: unspecified  . Lactose Intolerance (Gi) Other (See Comments)    Milk only.Patient states that she can eat cheese.  Marland Kitchen Morphine Sulfate     REACTION: unspecified    Current Outpatient Prescriptions on File Prior to Visit  Medication Sig Dispense Refill  . albuterol (PROVENTIL HFA;VENTOLIN HFA) 108 (90 BASE) MCG/ACT inhaler Inhale 2 puffs into the lungs every 6 (six) hours as needed for shortness of breath.       Marland Kitchen  Eszopiclone 3 MG TABS TAKE 1 TABLET BY MOUTH IMMEDIATELY BEFORE BEDTIME  60 tablet  2  . ibuprofen (ADVIL,MOTRIN) 200 MG tablet Take 400 mg by mouth every 8 (eight) hours as needed for pain.       Marland Kitchen levofloxacin (LEVAQUIN) 500 MG tablet Take 1 tablet (500 mg total) by mouth daily.  10 tablet  0  . metoCLOPramide (REGLAN) 5 MG tablet Take 1 tablet (5 mg total) by mouth 4 (four) times daily -  before meals and at bedtime.  40 tablet  1  . pantoprazole (PROTONIX) 40 MG tablet Take 1 tablet (40 mg total) by mouth  daily.  90 tablet  3  . PARoxetine (PAXIL) 20 MG tablet TAKE 1 TABLET BY MOUTH DAILY  90 tablet  1  . propranolol ER (INDERAL LA) 60 MG 24 hr capsule TAKE 1 CAPSULE BY MOUTH DAILY  90 capsule  0  . traMADol (ULTRAM) 50 MG tablet Take 1 tablet (50 mg total) by mouth every 8 (eight) hours as needed.  90 tablet  0   No current facility-administered medications on file prior to visit.    EXAM: BP 110/80  Pulse 72  Temp(Src) 98.2 F (36.8 C) (Oral)  Resp 18  Wt 140 lb (63.504 kg)     Objective:   Physical Exam  Nursing note and vitals reviewed. Constitutional: She is oriented to person, place, and time. She appears well-developed and well-nourished. No distress.  HENT:  Head: Normocephalic and atraumatic.  Eyes: Conjunctivae and EOM are normal. Pupils are equal, round, and reactive to light.  Cardiovascular: Normal rate, regular rhythm and intact distal pulses.   Pulmonary/Chest: Effort normal and breath sounds normal. No respiratory distress. She exhibits no tenderness.  No CVA tenderness.  Neurological: She is alert and oriented to person, place, and time.  Skin: Skin is warm and dry. No rash noted. She is not diaphoretic. No erythema. No pallor.  Psychiatric: She has a normal mood and affect. Her behavior is normal. Judgment and thought content normal.    Lab Results  Component Value Date   WBC 8.4 03/04/2014   HGB 15.3* 03/04/2014   HCT 45.6 03/04/2014   PLT 273.0 03/04/2014   GLUCOSE 91 03/04/2014   CHOL 194 10/07/2012   TRIG 132.0 10/07/2012   HDL 42.60 10/07/2012   LDLCALC 125* 10/07/2012   ALT 22 03/04/2014   AST 27 03/04/2014   NA 137 03/04/2014   K 4.8 03/04/2014   CL 102 03/04/2014   CREATININE 0.8 03/04/2014   BUN 20 03/04/2014   CO2 29 03/04/2014   TSH 2.56 10/07/2012   INR 0.8 10/10/2007   Urinalysis    Component Value Date/Time   BILIRUBINUR n 04/30/2014 1109   PROTEINUR n 04/30/2014 1109   UROBILINOGEN 0.2 04/30/2014 1109   NITRITE n 04/30/2014 1109   LEUKOCYTESUR Trace 04/30/2014 1109          Assessment & Plan:  [name] was seen today for dysuria and hematuria.  Diagnoses and associated orders for this visit:  Hematuria - POCT urinalysis dipstick; Standing - POCT urinalysis dipstick - Ambulatory referral to Urology - Culture, Urine  Dysuria Comments: UA shows Leuks and 1+blood, Nitrites no longer visible. Continue abx, add pyridium. Refer to Urology. - Ambulatory referral to Urology - phenazopyridine (PYRIDIUM) 200 MG tablet; Take 1 tablet (200 mg total) by mouth 3 (three) times daily as needed for pain. - Culture, Urine  Acute maxillary sinusitis, recurrence not specified Comments: Resolved, no longer  experiencing symptoms, continue full course of abx.  UTI (lower urinary tract infection) Comments: Resolving? Nitrites no longer per UA, dysuria worsening. Will refer to urology. - Culture, Urine    As symptoms appear to be worsening despite treatment, will send for evaluation by urology.  Return precautions provided, and patient handout on dysuria.  Plan to follow up as needed, or for worsening or persistent symptoms despite treatment.  Patient Instructions  Pyridium 3 times daily as needed for pain.  Continue Levaquin until full course is completed.  We are referring you to urology to be evaluated as your UTI appears to be improving, however your dysuria is worsening.  Push fluids.  If emergency symptoms discussed during visit developed, seek medical attention immediately.  Followup as needed, or for worsening or persistent symptoms despite treatment.

## 2014-05-02 LAB — URINE CULTURE
Colony Count: NO GROWTH
Organism ID, Bacteria: NO GROWTH

## 2014-05-10 ENCOUNTER — Ambulatory Visit: Payer: Commercial Managed Care - PPO | Admitting: Gastroenterology

## 2014-06-08 ENCOUNTER — Ambulatory Visit: Payer: Commercial Managed Care - PPO | Admitting: Internal Medicine

## 2014-07-16 ENCOUNTER — Other Ambulatory Visit: Payer: Self-pay | Admitting: Internal Medicine

## 2014-07-17 ENCOUNTER — Other Ambulatory Visit: Payer: Self-pay | Admitting: Internal Medicine

## 2014-07-22 ENCOUNTER — Ambulatory Visit: Payer: Commercial Managed Care - PPO | Admitting: Internal Medicine

## 2014-08-04 ENCOUNTER — Telehealth: Payer: Self-pay | Admitting: Internal Medicine

## 2014-08-04 MED ORDER — ESZOPICLONE 3 MG PO TABS: ORAL_TABLET | ORAL | Status: DC

## 2014-08-04 NOTE — Telephone Encounter (Signed)
[id number] - [place], [place] is requesting re-fill on Eszopiclone 3 MG TABS

## 2014-09-13 ENCOUNTER — Other Ambulatory Visit: Payer: Self-pay | Admitting: Internal Medicine

## 2014-10-07 ENCOUNTER — Emergency Department (HOSPITAL_COMMUNITY): Payer: Commercial Managed Care - PPO

## 2014-10-07 ENCOUNTER — Emergency Department (HOSPITAL_COMMUNITY)
Admission: EM | Admit: 2014-10-07 | Discharge: 2014-10-08 | Disposition: A | Discharge status: Home / Self Care | Payer: Commercial Managed Care - PPO | Attending: Emergency Medicine | Admitting: Emergency Medicine

## 2014-10-07 ENCOUNTER — Encounter (HOSPITAL_COMMUNITY): Payer: Self-pay

## 2014-10-07 DIAGNOSIS — M546 Pain in thoracic spine: Secondary | ICD-10-CM | POA: Diagnosis not present

## 2014-10-07 DIAGNOSIS — Z79899 Other long term (current) drug therapy: Secondary | ICD-10-CM | POA: Insufficient documentation

## 2014-10-07 DIAGNOSIS — R079 Chest pain, unspecified: Secondary | ICD-10-CM | POA: Diagnosis not present

## 2014-10-07 DIAGNOSIS — Z720 Tobacco use: Secondary | ICD-10-CM | POA: Insufficient documentation

## 2014-10-07 DIAGNOSIS — M25512 Pain in left shoulder: Secondary | ICD-10-CM | POA: Diagnosis not present

## 2014-10-07 DIAGNOSIS — Z8709 Personal history of other diseases of the respiratory system: Secondary | ICD-10-CM | POA: Diagnosis not present

## 2014-10-07 LAB — BASIC METABOLIC PANEL
Anion gap: 6 (ref 5–15)
BUN: 14 mg/dL (ref 6–23)
CO2: 23 mmol/L (ref 19–32)
Calcium: 9.1 mg/dL (ref 8.4–10.5)
Chloride: 105 mEq/L (ref 96–112)
Creatinine, Ser: 0.75 mg/dL (ref 0.50–1.10)
GFR calc Af Amer: >90 mL/min (ref >90)
GFR calc non Af Amer: >90 mL/min (ref >90)
Glucose, Bld: 111 mg/dL — ABNORMAL HIGH (ref 70–99)
Potassium: 3.6 mmol/L (ref 3.5–5.1)
Sodium: 134 mmol/L — ABNORMAL LOW (ref 135–145)

## 2014-10-07 LAB — CBC WITH DIFFERENTIAL/PLATELET
Basophils Absolute: 0 10*3/uL (ref 0.0–0.1)
Basophils Relative: 0 % (ref 0–1)
Eosinophils Absolute: 0.1 10*3/uL (ref 0.0–0.7)
Eosinophils Relative: 2 % (ref 0–5)
HCT: 42.6 % (ref 36.0–46.0)
Hemoglobin: 14.4 g/dL (ref 12.0–15.0)
Lymphocytes Relative: 35 % (ref 12–46)
Lymphs Abs: 2.1 10*3/uL (ref 0.7–4.0)
MCH: 29.9 pg (ref 26.0–34.0)
MCHC: 33.8 g/dL (ref 30.0–36.0)
MCV: 88.6 fL (ref 78.0–100.0)
Monocytes Absolute: 0.5 10*3/uL (ref 0.1–1.0)
Monocytes Relative: 9 % (ref 3–12)
Neutro Abs: 3.3 10*3/uL (ref 1.7–7.7)
Neutrophils Relative %: 54 % (ref 43–77)
Platelets: 203 10*3/uL (ref 150–400)
RBC: 4.81 MIL/uL (ref 3.87–5.11)
RDW: 12.4 % (ref 11.5–15.5)
WBC: 6.1 10*3/uL (ref 4.0–10.5)

## 2014-10-07 LAB — TROPONIN I: Troponin I: <0.03 ng/mL (ref <0.031)

## 2014-10-07 MED ORDER — NITROGLYCERIN 0.4 MG SL SUBL
0.4000 mg | SUBLINGUAL_TABLET | SUBLINGUAL | Status: DC | PRN
  Filled 2014-10-07: qty 1

## 2014-10-07 NOTE — ED Notes (Signed)
Pt complains of left hand being numb, then arm tingling, then slight chest pain and feeling nauseated since about 530 this afternoon

## 2014-10-07 NOTE — ED Provider Notes (Signed)
CSN: [id number]     Arrival date & time 10/07/14  2022 History   First MD Initiated Contact with Patient 10/07/14 2117     Chief Complaint  Patient presents with  . Chest Pain  . Shoulder Pain     (Consider location/radiation/quality/duration/timing/severity/associated sxs/prior Treatment) HPI  59 year old female presents with an acute onset of left hand feeling cold and numb. Then noticed left shoulder and neck pain in addition to left thoracic back pain. Also had some chest pain. No shortness of breath worse than normal. Did feel nauseated. Symptoms have improved, has no more hand symptoms but states she still has the thoracic back as well as shoulder pain. Has never had this before. Has prior history of smoking and bronchitis but denies hypertension, hyperlipidemia, and diabetes.  Past Medical History  Diagnosis Date  . Bronchitis    Past Surgical History  Procedure Laterality Date  . Abdominal hysterectomy  1986   Family History  Problem Relation Age of Onset  . Asthma Mother   . Alzheimer's disease Mother   . Hypertension Mother   . Cancer Maternal Aunt   . Cancer Paternal Aunt     Breast   History  Substance Use Topics  . Smoking status: Current Every Day Smoker -- 1.00 packs/day    Types: Cigarettes  . Smokeless tobacco: Never Used  . Alcohol Use: 0.6 oz/week    1 Shots of liquor per week     Comment: Occasional.   OB History    No data available     Review of Systems  Respiratory: Negative for shortness of breath.   Cardiovascular: Positive for chest pain.  Gastrointestinal: Negative for vomiting and abdominal pain.  Musculoskeletal: Positive for back pain and arthralgias.  Neurological: Positive for numbness.  All other systems reviewed and are negative.     Allergies  Aspirin; Glycopyrrolate; Lactose intolerance (gi); and Morphine sulfate  Home Medications   Prior to Admission medications   Medication Sig Start Date End Date Taking? Authorizing  Provider  albuterol (PROVENTIL HFA;VENTOLIN HFA) 108 (90 BASE) MCG/ACT inhaler Inhale 2 puffs into the lungs every 6 (six) hours as needed for shortness of breath.    Yes Historical Provider, MD  Eszopiclone 3 MG TABS TAKE 1 TABLET BY MOUTH IMMEDIATELY BEFORE BEDTIME 08/04/14  Yes Marletta Lor, MD  ibuprofen (ADVIL,MOTRIN) 200 MG tablet Take 400 mg by mouth every 8 (eight) hours as needed for pain.    Yes Historical Provider, MD  pantoprazole (PROTONIX) 40 MG tablet Take 1 tablet (40 mg total) by mouth daily. 02/05/14  Yes Marletta Lor, MD  PARoxetine (PAXIL) 20 MG tablet TAKE 1 TABLET BY MOUTH DAILY 09/14/14  Yes Marletta Lor, MD  propranolol ER (INDERAL LA) 60 MG 24 hr capsule TAKE 1 CAPSULE BY MOUTH ONCE DAILY 07/19/14  Yes Marletta Lor, MD  levofloxacin (LEVAQUIN) 500 MG tablet Take 1 tablet (500 mg total) by mouth daily. Patient not taking: Reported on 10/07/2014 04/26/14   Zenaida Niece, PA-C  metoCLOPramide (REGLAN) 5 MG tablet Take 1 tablet (5 mg total) by mouth 4 (four) times daily -  before meals and at bedtime. Patient not taking: Reported on 10/07/2014 01/21/14   Marletta Lor, MD  phenazopyridine (PYRIDIUM) 200 MG tablet Take 1 tablet (200 mg total) by mouth 3 (three) times daily as needed for pain. Patient not taking: Reported on 10/07/2014 04/30/14   Zenaida Niece, PA-C  traMADol (ULTRAM) 50 MG tablet Take 1  tablet (50 mg total) by mouth every 8 (eight) hours as needed. Patient not taking: Reported on 10/07/2014 03/04/14   Marletta Lor, MD   BP 110/56 mmHg  Pulse 71  Temp(Src) 98.1 F (36.7 C) (Oral)  Resp 19  SpO2 96% Physical Exam  Constitutional: She is oriented to person, place, and time. She appears well-developed and well-nourished.  HENT:  Head: Normocephalic and atraumatic.  Right Ear: External ear normal.  Left Ear: External ear normal.  Nose: Nose normal.  Eyes: Right eye exhibits no discharge. Left eye exhibits no discharge.   Cardiovascular: Normal rate, regular rhythm, normal heart sounds and intact distal pulses.   Pulses:      Radial pulses are 2+ on the right side, and 2+ on the left side.  Pulmonary/Chest: Effort normal and breath sounds normal. She has no wheezes. She exhibits no tenderness.  Abdominal: Soft. She exhibits no distension. There is no tenderness.  Musculoskeletal:       Left shoulder: She exhibits normal range of motion and no tenderness.  Neurological: She is alert and oriented to person, place, and time.  Normal strength and sensation in upper extremities bilaterally  Skin: Skin is warm and dry.  Nursing note and vitals reviewed.   ED Course  Procedures (including critical care time) Labs Review Labs Reviewed  BASIC METABOLIC PANEL - Abnormal; Notable for the following:    Sodium 134 (*)    Glucose, Bld 111 (*)    All other components within normal limits  CBC WITH DIFFERENTIAL  TROPONIN I  TROPONIN I    Imaging Review Dg Chest 2 View  10/07/2014   CLINICAL DATA:  Chest pain today.  EXAM: CHEST  2 VIEW  COMPARISON:  July 26, 2012  FINDINGS: The heart size and mediastinal contours are within normal limits. There is no focal infiltrate, pulmonary edema, or pleural effusion. The visualized skeletal structures are stable.  IMPRESSION: No active cardiopulmonary disease.   Electronically Signed   By: Abelardo Diesel M.D.   On: 10/07/2014 21:53     EKG Interpretation   Date/Time:  Thursday October 07 2014 20:39:16 EST Ventricular Rate:  68 PR Interval:  164 QRS Duration: 95 QT Interval:  414 QTC Calculation: 440 R Axis:   65 Text Interpretation:  Sinus rhythm Ventricular premature complex Consider  right atrial enlargement Low voltage, precordial leads Probable  anteroseptal infarct, old Baseline wander in lead(s) V1 V4 V5 No  significant change since last tracing Confirmed by [place]  ([id number]) on 10/07/2014 9:25:21 PM      MDM   Final diagnoses:  Left shoulder  pain    Patient's shoulder pain and arm symptoms are of unclear etiology. The patient has no symptoms currently. She has normal perfusion, normal pulse and cap refill in this left hand. Normal range of motion of shoulder. I discussed with the patient's could be an atypical cardiac presentation, but given that initial troponin is negative and she is asymptomatic she prefers to follow-up with her PCP in the next 1-2 days. Will evaluate with second troponin. I discussed strict return precautions and she agrees and will follow-up ASAP or return to ER. Care transferred to Dr. Claudine Mouton with second troponin pending.    Ephraim Hamburger, MD 10/07/14 [phone]

## 2014-10-08 LAB — TROPONIN I: Troponin I: <0.03 ng/mL (ref <0.031)

## 2014-10-08 NOTE — Discharge Instructions (Signed)
Arthralgia [name], you were seen today for shoulder pain. Your heart was evaluated and there has not been any damage according to your EKG and our blood work.  Follow-up with your primary care physician within 3 days for continued management. If symptoms worsen come back to emergency department immediately. Thank you. Arthralgia is joint pain. A joint is a place where two bones meet. Joint pain can happen for many reasons. The joint can be bruised, stiff, infected, or weak from aging. Pain usually goes away after resting and taking medicine for soreness.  HOME CARE  Rest the joint as told by your doctor.  Keep the sore joint raised (elevated) for the first 24 hours.  Put ice on the joint area.  Put ice in a plastic bag.  Place a towel between your skin and the bag.  Leave the ice on for 15-20 minutes, 03-04 times a day.  Wear your splint, casting, elastic bandage, or sling as told by your doctor.  Only take medicine as told by your doctor. Do not take aspirin.  Use crutches as told by your doctor. Do not put weight on the joint until told to by your doctor. GET HELP RIGHT AWAY IF:   You have bruising, puffiness (swelling), or more pain.  Your fingers or toes turn blue or start to lose feeling (numb).  Your medicine does not lessen the pain.  Your pain becomes severe.  You have a temperature by mouth above 102 F (38.9 C), not controlled by medicine.  You cannot move or use the joint. MAKE SURE YOU:   Understand these instructions.  Will watch your condition.  Will get help right away if you are not doing well or get worse. Document Released: 09/05/2009 Document Revised: 12/10/2011 Document Reviewed: 09/05/2009 [facility] Patient Information 2015 [place], [place]. This information is not intended to replace advice given to you by your health care provider. Make sure you discuss any questions you have with your health care provider.

## 2014-12-17 ENCOUNTER — Other Ambulatory Visit: Payer: Self-pay | Admitting: Internal Medicine

## 2015-02-10 ENCOUNTER — Other Ambulatory Visit: Payer: Self-pay | Admitting: Internal Medicine

## 2015-02-10 ENCOUNTER — Other Ambulatory Visit: Payer: Self-pay | Admitting: *Deleted

## 2015-02-10 MED ORDER — ESZOPICLONE 3 MG PO TABS: ORAL_TABLET | ORAL | Status: DC

## 2015-02-21 ENCOUNTER — Encounter: Payer: Self-pay | Admitting: Gastroenterology

## 2015-02-23 ENCOUNTER — Telehealth: Payer: Self-pay

## 2015-02-23 ENCOUNTER — Other Ambulatory Visit: Payer: Self-pay | Admitting: Internal Medicine

## 2015-02-23 DIAGNOSIS — Z1231 Encounter for screening mammogram for malignant neoplasm of breast: Secondary | ICD-10-CM

## 2015-02-23 DIAGNOSIS — Z1239 Encounter for other screening for malignant neoplasm of breast: Secondary | ICD-10-CM

## 2015-02-23 NOTE — Telephone Encounter (Signed)
Called and spoke with pt concerning mammogram.  Pt states she is between jobs right now but should would like to get her mammogram this year.  Advised pt that I could enter the order for her and when she is ready she can call the breast center to schedule.  Pt verbalized understanding and mammogram was entered.

## 2015-03-09 ENCOUNTER — Telehealth: Payer: Self-pay | Admitting: Gastroenterology

## 2015-03-09 NOTE — Telephone Encounter (Signed)
Pt has an appt with Dr Burnice Logan and will follow up with Dr Ardis Hughs 04/25/15.  Dr Raliegh Ip has been prescribing protonix, she will let hm know that is not working.

## 2015-03-12 ENCOUNTER — Other Ambulatory Visit: Payer: Self-pay | Admitting: Internal Medicine

## 2015-03-14 ENCOUNTER — Encounter: Payer: Self-pay | Admitting: Internal Medicine

## 2015-03-14 ENCOUNTER — Ambulatory Visit (INDEPENDENT_AMBULATORY_CARE_PROVIDER_SITE_OTHER): Payer: Commercial Managed Care - PPO | Admitting: Internal Medicine

## 2015-03-14 VITALS — BP 120/80 | HR 64 | Temp 97.7°F | Resp 20 | Ht 65.0 in | Wt 127.0 lb

## 2015-03-14 DIAGNOSIS — F4323 Adjustment disorder with mixed anxiety and depressed mood: Secondary | ICD-10-CM | POA: Diagnosis not present

## 2015-03-14 DIAGNOSIS — Z720 Tobacco use: Secondary | ICD-10-CM

## 2015-03-14 DIAGNOSIS — K219 Gastro-esophageal reflux disease without esophagitis: Secondary | ICD-10-CM | POA: Diagnosis not present

## 2015-03-14 DIAGNOSIS — F17200 Nicotine dependence, unspecified, uncomplicated: Secondary | ICD-10-CM

## 2015-03-14 DIAGNOSIS — B182 Chronic viral hepatitis C: Secondary | ICD-10-CM | POA: Diagnosis not present

## 2015-03-14 HISTORY — DX: Chronic viral hepatitis C: B18.2

## 2015-03-14 MED ORDER — SUMATRIPTAN SUCCINATE 50 MG PO TABS: 50.0000 mg | ORAL_TABLET | ORAL | Status: DC | PRN

## 2015-03-14 MED ORDER — PROPRANOLOL HCL ER 60 MG PO CP24: 60.0000 mg | ORAL_CAPSULE | Freq: Every day | ORAL | Status: DC

## 2015-03-14 MED ORDER — METOCLOPRAMIDE HCL 5 MG PO TABS: 5.0000 mg | ORAL_TABLET | Freq: Three times a day (TID) | ORAL | Status: DC

## 2015-03-14 MED ORDER — ESZOPICLONE 3 MG PO TABS: ORAL_TABLET | ORAL | Status: DC

## 2015-03-14 MED ORDER — PANTOPRAZOLE SODIUM 40 MG PO TBEC: 40.0000 mg | DELAYED_RELEASE_TABLET | Freq: Two times a day (BID) | ORAL | Status: DC

## 2015-03-14 NOTE — Progress Notes (Signed)
Subjective:    Patient ID: [name], female    DOB: [birth year], 59 y.o.   MRN: [id number]  HPI  59 year old patient who presents today with a chief complaint of worsening reflux symptoms.  She states that she takes Protonix in the morning and does fairly well until mid afternoon.  When she took Protonix at bedtime.  She seemed to have heartburn in the morning and throughout the day. She has significant stressors including a mother with dementia who is in an extended care facility.  Her husband has been ill, status post MI in April complicated by a stroke.  He presently is in rehabilitation but will returning home soon and will require 24/7 care.  Her dog died recently.  Very little social support. She continues to smoke 1 pack of cigarettes daily She also complains of severe headaches that start with visual disturbances and often last 2 days.  No formal diagnosis of migraine.  She does take beta blockers for tremor and also is on an SSRI. She has chronic hepatitis C and is followed at a specialty clinic annually.  Presently felt not to be a candidate for treatment at this time  Past Medical History  Diagnosis Date  . Bronchitis     History   Social History  . Marital Status: Married    Spouse Name: N/A  . Number of Children: N/A  . Years of Education: N/A   Occupational History  . Not on file.   Social History Main Topics  . Smoking status: Current Every Day Smoker -- 1.00 packs/day    Types: Cigarettes  . Smokeless tobacco: Never Used  . Alcohol Use: 0.6 oz/week    1 Shots of liquor per week     Comment: Occasional.  . Drug Use: No  . Sexual Activity: Not on file   Other Topics Concern  . Not on file   Social History Narrative    Past Surgical History  Procedure Laterality Date  . Abdominal hysterectomy  1986    Family History  Problem Relation Age of Onset  . Asthma Mother   . Alzheimer's disease Mother   . Hypertension Mother   . Cancer Maternal Aunt    . Cancer Paternal Aunt     Breast    Allergies  Allergen Reactions  . Aspirin     REACTION: unspecified  . Glycopyrrolate     REACTION: unspecified, pt unsure if she is allergic.  . Lactose Intolerance (Gi) Other (See Comments)    Milk only.Patient states that she can eat cheese.  Marland Kitchen Morphine Sulfate     REACTION: unspecified    Current Outpatient Prescriptions on File Prior to Visit  Medication Sig Dispense Refill  . albuterol (PROVENTIL HFA;VENTOLIN HFA) 108 (90 BASE) MCG/ACT inhaler Inhale 2 puffs into the lungs every 6 (six) hours as needed for shortness of breath.     . Eszopiclone 3 MG TABS TAKE 1 TABLET BY MOUTH IMMEDIATELY BEFORE BEDTIME 30 tablet 0  . ibuprofen (ADVIL,MOTRIN) 200 MG tablet Take 400 mg by mouth every 8 (eight) hours as needed for pain.     . pantoprazole (PROTONIX) 40 MG tablet TAKE 1 TABLET BY MOUTH DAILY 90 tablet 0  . PARoxetine (PAXIL) 20 MG tablet TAKE 1 TABLET BY MOUTH DAILY 90 tablet 1  . propranolol ER (INDERAL LA) 60 MG 24 hr capsule TAKE 1 CAPSULE BY MOUTH ONCE DAILY 90 capsule 1   No current facility-administered medications on file prior to visit.  BP 120/80 mmHg  Pulse 64  Temp(Src) 97.7 F (36.5 C) (Oral)  Resp 20  Ht '5\' 5"'$  (1.651 m)  Wt 127 lb (57.607 kg)  BMI 21.13 kg/m2  SpO2 97%     Review of Systems  Constitutional: Positive for activity change and appetite change.  HENT: Negative for congestion, dental problem, hearing loss, rhinorrhea, sinus pressure, sore throat and tinnitus.   Eyes: Negative for pain, discharge and visual disturbance.  Respiratory: Negative for cough and shortness of breath.   Cardiovascular: Negative for chest pain, palpitations and leg swelling.  Gastrointestinal: Positive for abdominal pain. Negative for nausea, vomiting, diarrhea, constipation, blood in stool and abdominal distention.  Genitourinary: Negative for dysuria, urgency, frequency, hematuria, flank pain, vaginal bleeding, vaginal  discharge, difficulty urinating, vaginal pain and pelvic pain.  Musculoskeletal: Negative for joint swelling, arthralgias and gait problem.  Skin: Negative for rash.  Neurological: Negative for dizziness, syncope, speech difficulty, weakness, numbness and headaches.  Hematological: Negative for adenopathy.  Psychiatric/Behavioral: Positive for sleep disturbance and dysphoric mood. Negative for behavioral problems and agitation. The patient is nervous/anxious.        Objective:   Physical Exam  Constitutional: She is oriented to person, place, and time. She appears well-developed and well-nourished.  HENT:  Head: Normocephalic.  Right Ear: External ear normal.  Left Ear: External ear normal.  Mouth/Throat: Oropharynx is clear and moist.  Eyes: Conjunctivae and EOM are normal. Pupils are equal, round, and reactive to light.  Neck: Normal range of motion. Neck supple. No thyromegaly present.  Cardiovascular: Normal rate, regular rhythm, normal heart sounds and intact distal pulses.   Pulmonary/Chest: Effort normal and breath sounds normal.  Abdominal: Soft. Bowel sounds are normal. She exhibits no distension and no mass. There is no tenderness. There is no rebound and no guarding.  Musculoskeletal: Normal range of motion.  Lymphadenopathy:    She has no cervical adenopathy.  Neurological: She is alert and oriented to person, place, and time.  Skin: Skin is warm and dry. No rash noted.  Psychiatric:  Anxious at times tearful          Assessment & Plan:   Severe GERD.  We'll increase Protonix to a twice a day regimen and add short-term Reglan.  Patient is scheduled to see GI on July 25.  Likely aggravated by significant stressors and ongoing tobacco use Chronic hepatitis C -follow-up specialty clinic annually Migraine with aura.  Will give a trial of Imitrex Situational stress.  Information concerning behavioral health referral.  Dispensed  Follow GI Behavioral health  referral Recheck here 2 months or as needed

## 2015-03-14 NOTE — Patient Instructions (Signed)
GI follow-up as scheduled Consider behavioral health evaluation  Avoids foods high in acid such as tomatoes citrus juices, and spicy foods.  Avoid eating within two hours of lying down or before exercising.  Do not overheat.  Try smaller more frequent meals.  If symptoms persist, elevate the head of her bed 12 inches while sleeping.  Return in 2 months for follow-upMigraine Headache A migraine headache is an intense, throbbing pain on one or both sides of your head. A migraine can last for 30 minutes to several hours. CAUSES  The exact cause of a migraine headache is not always known. However, a migraine may be caused when nerves in the brain become irritated and release chemicals that cause inflammation. This causes pain. Certain things may also trigger migraines, such as:  Alcohol.  Smoking.  Stress.  Menstruation.  Aged cheeses.  Foods or drinks that contain nitrates, glutamate, aspartame, or tyramine.  Lack of sleep.  Chocolate.  Caffeine.  Hunger.  Physical exertion.  Fatigue.  Medicines used to treat chest pain (nitroglycerine), birth control pills, estrogen, and some blood pressure medicines. SIGNS AND SYMPTOMS  Pain on one or both sides of your head.  Pulsating or throbbing pain.  Severe pain that prevents daily activities.  Pain that is aggravated by any physical activity.  Nausea, vomiting, or both.  Dizziness.  Pain with exposure to bright lights, loud noises, or activity.  General sensitivity to bright lights, loud noises, or smells. Before you get a migraine, you may get warning signs that a migraine is coming (aura). An aura may include:  Seeing flashing lights.  Seeing bright spots, halos, or zigzag lines.  Having tunnel vision or blurred vision.  Having feelings of numbness or tingling.  Having trouble talking.  Having muscle weakness. DIAGNOSIS  A migraine headache is often diagnosed based on:  Symptoms.  Physical exam.  A CT  scan or MRI of your head. These imaging tests cannot diagnose migraines, but they can help rule out other causes of headaches. TREATMENT Medicines may be given for pain and nausea. Medicines can also be given to help prevent recurrent migraines.  HOME CARE INSTRUCTIONS  Only take over-the-counter or prescription medicines for pain or discomfort as directed by your health care provider. The use of long-term narcotics is not recommended.  Lie down in a dark, quiet room when you have a migraine.  Keep a journal to find out what may trigger your migraine headaches. For example, write down:  What you eat and drink.  How much sleep you get.  Any change to your diet or medicines.  Limit alcohol consumption.  Quit smoking if you smoke.  Get 7-9 hours of sleep, or as recommended by your health care provider.  Limit stress.  Keep lights dim if bright lights bother you and make your migraines worse. SEEK IMMEDIATE MEDICAL CARE IF:   Your migraine becomes severe.  You have a fever.  You have a stiff neck.  You have vision loss.  You have muscular weakness or loss of muscle control.  You start losing your balance or have trouble walking.  You feel faint or pass out.  You have severe symptoms that are different from your first symptoms. MAKE SURE YOU:   Understand these instructions.  Will watch your condition.  Will get help right away if you are not doing well or get worse. Document Released: 09/17/2005 Document Revised: 02/01/2014 Document Reviewed: 05/25/2013 [facility] Patient Information 2015 [place], [place]. This information is not intended  to replace advice given to you by your health care provider. Make sure you discuss any questions you have with your health care provider.   Food Choices for Gastroesophageal Reflux Disease When you have gastroesophageal reflux disease (GERD), the foods you eat and your eating habits are very important. Choosing the right foods can  help ease the discomfort of GERD. WHAT GENERAL GUIDELINES DO I NEED TO FOLLOW?  Choose fruits, vegetables, whole grains, low-fat dairy products, and low-fat meat, fish, and poultry.  Limit fats such as oils, salad dressings, butter, nuts, and avocado.  Keep a food diary to identify foods that cause symptoms.  Avoid foods that cause reflux. These may be different for different people.  Eat frequent small meals instead of three large meals each day.  Eat your meals slowly, in a relaxed setting.  Limit fried foods.  Cook foods using methods other than frying.  Avoid drinking alcohol.  Avoid drinking large amounts of liquids with your meals.  Avoid bending over or lying down until 2-3 hours after eating. WHAT FOODS ARE NOT RECOMMENDED? The following are some foods and drinks that may worsen your symptoms: Vegetables Tomatoes. Tomato juice. Tomato and spaghetti sauce. Chili peppers. Onion and garlic. Horseradish. Fruits Oranges, grapefruit, and lemon (fruit and juice). Meats High-fat meats, fish, and poultry. This includes hot dogs, ribs, ham, sausage, salami, and bacon. Dairy Whole milk and chocolate milk. Sour cream. Cream. Butter. Ice cream. Cream cheese.  Beverages Coffee and tea, with or without caffeine. Carbonated beverages or energy drinks. Condiments Hot sauce. Barbecue sauce.  Sweets/Desserts Chocolate and cocoa. Donuts. Peppermint and spearmint. Fats and Oils High-fat foods, including [place] fries and potato chips. Other Vinegar. Strong spices, such as black pepper, white pepper, red pepper, cayenne, curry powder, cloves, ginger, and chili powder. The items listed above may not be a complete list of foods and beverages to avoid. Contact your dietitian for more information. Document Released: 09/17/2005 Document Revised: 09/22/2013 Document Reviewed: 07/22/2013 [facility] Patient Information 2015 [place], [place]. This information is not intended to replace advice  given to you by your health care provider. Make sure you discuss any questions you have with your health care provider.

## 2015-03-14 NOTE — Progress Notes (Signed)
Pre visit review using our clinic review tool, if applicable. No additional management support is needed unless otherwise documented below in the visit note. 

## 2015-03-22 ENCOUNTER — Encounter: Payer: Self-pay | Admitting: Internal Medicine

## 2015-03-22 ENCOUNTER — Ambulatory Visit (INDEPENDENT_AMBULATORY_CARE_PROVIDER_SITE_OTHER): Payer: Commercial Managed Care - PPO | Admitting: Internal Medicine

## 2015-03-22 VITALS — BP 100/60 | HR 66 | Temp 97.9°F | Resp 18 | Ht 65.0 in | Wt 132.0 lb

## 2015-03-22 DIAGNOSIS — H6012 Cellulitis of left external ear: Secondary | ICD-10-CM | POA: Diagnosis not present

## 2015-03-22 MED ORDER — AMOXICILLIN-POT CLAVULANATE 875-125 MG PO TABS: 1.0000 | ORAL_TABLET | Freq: Two times a day (BID) | ORAL | Status: DC

## 2015-03-22 MED ORDER — DOXYCYCLINE HYCLATE 100 MG PO TABS: 100.0000 mg | ORAL_TABLET | Freq: Two times a day (BID) | ORAL | Status: DC

## 2015-03-22 NOTE — Progress Notes (Signed)
Pre visit review using our clinic review tool, if applicable. No additional management support is needed unless otherwise documented below in the visit note. 

## 2015-03-22 NOTE — Patient Instructions (Addendum)
Take your antibiotic as prescribed until ALL of it is gone, but stop if you develop a rash, swelling, or any side effects of the medication.  Contact our office as soon as possible if  there are side effects of the medication.  Call or return to clinic prn if these symptoms worsen or fail to improve as anticipated. Cellulitis Cellulitis is an infection of the skin and the tissue under the skin. The infected area is usually red and tender. This happens most often in the arms and lower legs. HOME CARE   Take your antibiotic medicine as told. Finish the medicine even if you start to feel better.  Keep the infected arm or leg raised (elevated).  Put a warm cloth on the area up to 4 times per day.  Only take medicines as told by your doctor.  Keep all doctor visits as told. GET HELP IF:  You see red streaks on the skin coming from the infected area.  Your red area gets bigger or turns a dark color.  Your bone or joint under the infected area is painful after the skin heals.  Your infection comes back in the same area or different area.  You have a puffy (swollen) bump in the infected area.  You have new symptoms.  You have a fever. GET HELP RIGHT AWAY IF:   You feel very sleepy.  You throw up (vomit) or have watery poop (diarrhea).  You feel sick and have muscle aches and pains. MAKE SURE YOU:   Understand these instructions.  Will watch your condition.  Will get help right away if you are not doing well or get worse. Document Released: 03/05/2008 Document Revised: 02/01/2014 Document Reviewed: 12/03/2011 [facility] Patient Information 2015 [place], [place]. This information is not intended to replace advice given to you by your health care provider. Make sure you discuss any questions you have with your health care provider.

## 2015-03-22 NOTE — Progress Notes (Signed)
Subjective:    Patient ID: [name], female    DOB: Apr 15, 1956, 59 y.o.   MRN: [id number]  HPI  59 year old patient who is seen with a chief complaint of left ear pain.  She was seen in the urgent care on June 15 and treated with cephalexin for cellulitis involving the left external ear.  He continues to have redness, pain and swelling and has had no meaningful benefit.  No fever or other constitutional complaints.  No drainage.  Infection started at a point of a prior piercing  Past Medical History  Diagnosis Date  . Bronchitis     History   Social History  . Marital Status: Married    Spouse Name: N/A  . Number of Children: N/A  . Years of Education: N/A   Occupational History  . Not on file.   Social History Main Topics  . Smoking status: Current Every Day Smoker -- 1.00 packs/day    Types: Cigarettes  . Smokeless tobacco: Never Used  . Alcohol Use: 0.6 oz/week    1 Shots of liquor per week     Comment: Occasional.  . Drug Use: No  . Sexual Activity: Not on file   Other Topics Concern  . Not on file   Social History Narrative    Past Surgical History  Procedure Laterality Date  . Abdominal hysterectomy  1986    Family History  Problem Relation Age of Onset  . Asthma Mother   . Alzheimer's disease Mother   . Hypertension Mother   . Cancer Maternal Aunt   . Cancer Paternal Aunt     Breast    Allergies  Allergen Reactions  . Aspirin     REACTION: unspecified  . Glycopyrrolate     REACTION: unspecified, pt unsure if she is allergic.  . Lactose Intolerance (Gi) Other (See Comments)    Milk only.Patient states that she can eat cheese.  Marland Kitchen Morphine Sulfate     REACTION: unspecified    Current Outpatient Prescriptions on File Prior to Visit  Medication Sig Dispense Refill  . albuterol (PROVENTIL HFA;VENTOLIN HFA) 108 (90 BASE) MCG/ACT inhaler Inhale 2 puffs into the lungs every 6 (six) hours as needed for shortness of breath.     . Eszopiclone  3 MG TABS TAKE 1 TABLET BY MOUTH IMMEDIATELY BEFORE BEDTIME 30 tablet 0  . metoCLOPramide (REGLAN) 5 MG tablet Take 1 tablet (5 mg total) by mouth 4 (four) times daily -  before meals and at bedtime. 40 tablet 1  . pantoprazole (PROTONIX) 40 MG tablet Take 1 tablet (40 mg total) by mouth 2 (two) times daily before a meal. 90 tablet 0  . PARoxetine (PAXIL) 20 MG tablet TAKE 1 TABLET BY MOUTH DAILY 90 tablet 1  . propranolol ER (INDERAL LA) 60 MG 24 hr capsule Take 1 capsule (60 mg total) by mouth daily. 90 capsule 1  . SUMAtriptan (IMITREX) 50 MG tablet Take 1 tablet (50 mg total) by mouth every 2 (two) hours as needed for migraine. May repeat in 2 hours if headache persists or recurs. 10 tablet 0   No current facility-administered medications on file prior to visit.    BP 100/60 mmHg  Pulse 66  Temp(Src) 97.9 F (36.6 C) (Oral)  Resp 18  Ht '5\' 5"'$  (1.651 m)  Wt 132 lb (59.875 kg)  BMI 21.97 kg/m2  SpO2 98%     Review of Systems  Constitutional: Negative.   HENT: Positive for ear pain. Negative  for congestion, dental problem, hearing loss, rhinorrhea, sinus pressure, sore throat and tinnitus.   Eyes: Negative for pain, discharge and visual disturbance.  Respiratory: Negative for cough and shortness of breath.   Cardiovascular: Negative for chest pain, palpitations and leg swelling.  Gastrointestinal: Negative for nausea, vomiting, abdominal pain, diarrhea, constipation, blood in stool and abdominal distention.  Genitourinary: Negative for dysuria, urgency, frequency, hematuria, flank pain, vaginal bleeding, vaginal discharge, difficulty urinating, vaginal pain and pelvic pain.  Musculoskeletal: Negative for joint swelling, arthralgias and gait problem.  Skin: Negative for rash.  Neurological: Negative for dizziness, syncope, speech difficulty, weakness, numbness and headaches.  Hematological: Negative for adenopathy.  Psychiatric/Behavioral: Negative for behavioral problems,  dysphoric mood and agitation. The patient is not nervous/anxious.        Objective:   Physical Exam  Constitutional: She appears well-developed and well-nourished. She appears distressed.  HENT:  The left external ear was erythematous with some edematous changes and quite tender.  No pre-or postauricular adenopathy noted.  Canal normal          Assessment & Plan:   Cellulitis, left external ear.  In view of her lack of clinical response will need to treat for MRSA.  We'll switch antibiotics to a combination of Augmentin and doxycycline, both twice a day  Will report any clinical worsening

## 2015-04-11 ENCOUNTER — Other Ambulatory Visit: Payer: Self-pay | Admitting: Internal Medicine

## 2015-04-25 ENCOUNTER — Ambulatory Visit: Payer: Commercial Managed Care - PPO | Admitting: Gastroenterology

## 2015-04-28 ENCOUNTER — Other Ambulatory Visit: Payer: Self-pay | Admitting: Internal Medicine

## 2015-05-16 ENCOUNTER — Ambulatory Visit: Payer: Commercial Managed Care - PPO | Admitting: Internal Medicine

## 2015-06-16 ENCOUNTER — Telehealth: Payer: Self-pay | Admitting: Internal Medicine

## 2015-06-17 ENCOUNTER — Other Ambulatory Visit: Payer: Self-pay | Admitting: Internal Medicine

## 2015-06-20 ENCOUNTER — Telehealth: Payer: Self-pay | Admitting: Internal Medicine

## 2015-06-20 ENCOUNTER — Other Ambulatory Visit: Payer: Self-pay | Admitting: Internal Medicine

## 2015-06-20 MED ORDER — ESZOPICLONE 3 MG PO TABS: ORAL_TABLET | ORAL | Status: DC

## 2015-06-20 NOTE — Addendum Note (Signed)
Addended by: Marian Sorrow on: 06/20/2015 03:51 PM   Modules accepted: Orders

## 2015-06-20 NOTE — Telephone Encounter (Signed)
Rx called in to pharmacy. 

## 2015-06-20 NOTE — Telephone Encounter (Signed)
Pt states [facility] keeps telling her the rx Eszopiclone 3 MG TABS Is not there. But you sent Friday. Pt would like you to resend if possible. [facility]/[address]/ [employer]

## 2015-06-24 ENCOUNTER — Other Ambulatory Visit: Payer: Self-pay | Admitting: Internal Medicine

## 2015-08-16 NOTE — Telephone Encounter (Signed)
error 

## 2015-09-19 ENCOUNTER — Telehealth: Payer: Self-pay | Admitting: Internal Medicine

## 2015-09-19 ENCOUNTER — Other Ambulatory Visit: Payer: Self-pay | Admitting: Internal Medicine

## 2015-09-19 MED ORDER — ESZOPICLONE 3 MG PO TABS: ORAL_TABLET | ORAL | Status: DC

## 2015-09-19 NOTE — Telephone Encounter (Signed)
T[place] from [facility] called for permission to fill Eszopiclone '3Mg'$  for the pt. Please give her a call if you have questions or concerns.  [facility] ph# [phone] Thank you.

## 2015-09-19 NOTE — Telephone Encounter (Signed)
Rx called in to pharmacy. 

## 2015-10-17 ENCOUNTER — Encounter: Payer: Self-pay | Admitting: Gastroenterology

## 2015-11-01 ENCOUNTER — Other Ambulatory Visit: Payer: Self-pay | Admitting: Internal Medicine

## 2015-11-16 ENCOUNTER — Encounter: Payer: Self-pay | Admitting: Internal Medicine

## 2015-11-16 ENCOUNTER — Ambulatory Visit (INDEPENDENT_AMBULATORY_CARE_PROVIDER_SITE_OTHER): Payer: Self-pay | Admitting: Internal Medicine

## 2015-11-16 VITALS — BP 110/80 | HR 67 | Temp 98.1°F | Resp 20 | Ht 65.0 in | Wt 122.0 lb

## 2015-11-16 DIAGNOSIS — J4520 Mild intermittent asthma, uncomplicated: Secondary | ICD-10-CM

## 2015-11-16 DIAGNOSIS — F17200 Nicotine dependence, unspecified, uncomplicated: Secondary | ICD-10-CM

## 2015-11-16 MED ORDER — AZITHROMYCIN 250 MG PO TABS: ORAL_TABLET | ORAL | Status: DC

## 2015-11-16 MED ORDER — PREDNISONE 20 MG PO TABS
20.0000 mg | ORAL_TABLET | Freq: Two times a day (BID) | ORAL | Status: DC
Start: 2015-11-16 — End: 2016-10-19

## 2015-11-16 NOTE — Patient Instructions (Signed)
Acute bronchitis symptoms are generally not helped by antibiotics.  Take over-the-counter expectorants and cough medications such as  Mucinex DM.  Call if there is no improvement in 5 to 7 days or if  you develop worsening cough, fever, or new symptoms, such as shortness of breath or chest pain.  Smoking tobacco is very bad for your health. You should stop smoking immediately.  HOME CARE INSTRUCTIONS  Drink plenty of water. Water helps thin the mucus so your sinuses can drain more easily.  Use a humidifier.  Inhale steam 3-4 times a day (for example, sit in the bathroom with the shower running).  Apply a warm, moist washcloth to your face 3-4 times a day, or as directed by your health care provider.  Use saline nasal sprays to help moisten and clean your sinuses.  Take medicines only as directed by your health care provider.

## 2015-11-16 NOTE — Progress Notes (Signed)
Subjective:    Patient ID: [name], female    DOB: 1955/10/23, 60 y.o.   MRN: [id number]  HPI 60 year old patient who has history of ongoing tobacco use.  She has been ill for the past 10 days with chest congestion, cough, intermittent wheezing and hoarseness.  She has had increase in albuterol use.  She has decreased her smoking consumption from 2 packs per day to one pack per day.  She has had and chest congestion with cough producing green sputum.  Past Medical History  Diagnosis Date  . Bronchitis     Social History   Social History  . Marital Status: Married    Spouse Name: N/A  . Number of Children: N/A  . Years of Education: N/A   Occupational History  . Not on file.   Social History Main Topics  . Smoking status: Current Every Day Smoker -- 1.00 packs/day    Types: Cigarettes  . Smokeless tobacco: Never Used  . Alcohol Use: 0.6 oz/week    1 Shots of liquor per week     Comment: Occasional.  . Drug Use: No  . Sexual Activity: Not on file   Other Topics Concern  . Not on file   Social History Narrative    Past Surgical History  Procedure Laterality Date  . Abdominal hysterectomy  1986    Family History  Problem Relation Age of Onset  . Asthma Mother   . Alzheimer's disease Mother   . Hypertension Mother   . Cancer Maternal Aunt   . Cancer Paternal Aunt     Breast    Allergies  Allergen Reactions  . Aspirin     REACTION: unspecified  . Glycopyrrolate     REACTION: unspecified, pt unsure if she is allergic.  . Lactose Intolerance (Gi) Other (See Comments)    Milk only.Patient states that she can eat cheese.  Marland Kitchen Morphine Sulfate     REACTION: unspecified    Current Outpatient Prescriptions on File Prior to Visit  Medication Sig Dispense Refill  . albuterol (PROVENTIL HFA;VENTOLIN HFA) 108 (90 BASE) MCG/ACT inhaler Inhale 2 puffs into the lungs every 6 (six) hours as needed for shortness of breath.     . Eszopiclone 3 MG TABS TAKE 1  TABLET BY MOUTH IMMEDIATELY BEFORE BEDTIME 30 tablet 5  . metoCLOPramide (REGLAN) 5 MG tablet TAKE 1 TABLET(5 MG) BY MOUTH FOUR TIMES DAILY BEFORE MEALS AND AT BEDTIME 40 tablet 2  . pantoprazole (PROTONIX) 40 MG tablet TAKE 1 TABLET BY MOUTH TWICE DAILY BEFORE A MEAL 90 tablet 0  . PARoxetine (PAXIL) 20 MG tablet TAKE ONE TABLET BY MOUTH ONCE DAILY 150 tablet 0  . propranolol ER (INDERAL LA) 60 MG 24 hr capsule Take 1 capsule (60 mg total) by mouth daily. 90 capsule 1  . SUMAtriptan (IMITREX) 50 MG tablet TAKE 1 TABLET BY MOUTH AS NEEDED FOR MIGRAINE. MAY REPEAT ONCE IN 2 HOURS IF HEADACHE PERSISTS OR RECURS 10 tablet 2   No current facility-administered medications on file prior to visit.    BP 110/80 mmHg  Pulse 67  Temp(Src) 98.1 F (36.7 C) (Oral)  Resp 20  Ht '5\' 5"'$  (1.651 m)  Wt 122 lb (55.339 kg)  BMI 20.30 kg/m2  SpO2 98%      Review of Systems  Constitutional: Positive for activity change, appetite change and fatigue.  HENT: Positive for congestion, postnasal drip and voice change. Negative for dental problem, hearing loss, rhinorrhea, sinus pressure, sore throat  and tinnitus.   Eyes: Negative for pain, discharge and visual disturbance.  Respiratory: Positive for cough and wheezing. Negative for shortness of breath.   Cardiovascular: Negative for chest pain, palpitations and leg swelling.  Gastrointestinal: Negative for nausea, vomiting, abdominal pain, diarrhea, constipation, blood in stool and abdominal distention.  Genitourinary: Negative for dysuria, urgency, frequency, hematuria, flank pain, vaginal bleeding, vaginal discharge, difficulty urinating, vaginal pain and pelvic pain.  Musculoskeletal: Negative for joint swelling, arthralgias and gait problem.  Skin: Negative for rash.  Neurological: Negative for dizziness, syncope, speech difficulty, weakness, numbness and headaches.  Hematological: Negative for adenopathy.  Psychiatric/Behavioral: Negative for behavioral  problems, dysphoric mood and agitation. The patient is not nervous/anxious.        Objective:   Physical Exam  Constitutional: She is oriented to person, place, and time. She appears well-developed and well-nourished.  HENT:  Head: Normocephalic.  Right Ear: External ear normal.  Left Ear: External ear normal.  Mouth/Throat: Oropharynx is clear and moist.  Eyes: Conjunctivae and EOM are normal. Pupils are equal, round, and reactive to light.  Neck: Normal range of motion. Neck supple. No thyromegaly present.  Cardiovascular: Normal rate, regular rhythm, normal heart sounds and intact distal pulses.   Pulmonary/Chest: Effort normal.  A few scattered rhonchi and faint wheezing; No increased work of breathing  Abdominal: Soft. Bowel sounds are normal. She exhibits no mass. There is no tenderness.  Musculoskeletal: Normal range of motion.  Lymphadenopathy:    She has no cervical adenopathy.  Neurological: She is alert and oriented to person, place, and time.  Skin: Skin is warm and dry. No rash noted.  Psychiatric: She has a normal mood and affect. Her behavior is normal.          Assessment & Plan:   Acute asthmatic bronchitis.  Will treat with the prednisone 20 mg twice a day for 6 days.  Still smoking cessation encouraged.  Will continue albuterol use as needed.  We'll place on Mucinex DM.  Samples provided She was given a printed prescription for azithromycin but will not take unless there is clinical worsening in 48 hours

## 2015-11-16 NOTE — Progress Notes (Signed)
Pre visit review using our clinic review tool, if applicable. No additional management support is needed unless otherwise documented below in the visit note. 

## 2016-03-17 ENCOUNTER — Other Ambulatory Visit: Payer: Self-pay | Admitting: Internal Medicine

## 2016-03-19 NOTE — Telephone Encounter (Signed)
Rx for Eszopiclone 3 mg faxed to pharmacy.

## 2016-04-18 ENCOUNTER — Other Ambulatory Visit: Payer: Self-pay | Admitting: Internal Medicine

## 2016-04-19 NOTE — Telephone Encounter (Signed)
Rx refill sent to pharmacy. 

## 2016-04-20 ENCOUNTER — Other Ambulatory Visit: Payer: Self-pay | Admitting: Internal Medicine

## 2016-04-20 NOTE — Telephone Encounter (Signed)
Patient requesting imitrex.   Last OV was 11/16/15 No upcoming appointment. Last RF was 06/24/15 x 2.  Please advise.

## 2016-05-19 ENCOUNTER — Other Ambulatory Visit: Payer: Self-pay | Admitting: Internal Medicine

## 2016-05-21 NOTE — Telephone Encounter (Signed)
Rx refill sent to pharmacy. 

## 2016-06-18 ENCOUNTER — Other Ambulatory Visit: Payer: Self-pay | Admitting: Internal Medicine

## 2016-06-19 NOTE — Telephone Encounter (Signed)
Pharmacy called for pt to request a refill of Eszopiclone 3 MG TABS  [facility] [place]

## 2016-06-20 ENCOUNTER — Telehealth: Payer: Self-pay | Admitting: Internal Medicine

## 2016-06-20 NOTE — Telephone Encounter (Signed)
Spoke to pt, told her Rx was called into the pharmacy last evening and to check with pharmacy. Pt verbalized understanding.

## 2016-06-20 NOTE — Telephone Encounter (Signed)
Pt needs refill on lunesta 3 mg #30 w/refills call into [facility] [place]

## 2016-08-02 ENCOUNTER — Telehealth: Payer: Self-pay

## 2016-08-02 NOTE — Telephone Encounter (Signed)
Pt is aware must have an appointment. Pt states she does not have insurance until jan 2018 and guess she will do with out her medication

## 2016-08-02 NOTE — Telephone Encounter (Signed)
Per chart pt's last refill on Paxil had note that she needs to schedule OV. Nothing has been scheduled at this time.   LMTCB for pt to schedule appt in order to get refills.

## 2016-08-02 NOTE — Telephone Encounter (Signed)
Per pt she does not have insurance and cannot afford to pay for office visit. Pt will have insurance starting 1/18.   Dr. Raliegh Ip - would you be willing to refill until that time if pt makes appt for January? Thanks.

## 2016-08-03 MED ORDER — PAROXETINE HCL 20 MG PO TABS: 20.0000 mg | ORAL_TABLET | Freq: Every day | ORAL | 0 refills | Status: DC

## 2016-08-03 NOTE — Telephone Encounter (Signed)
Okay to refill? 

## 2016-08-03 NOTE — Telephone Encounter (Signed)
Pt returned your call.  Aware rx sent in. Appointment made for 10/19/16 at 3:45 pm. Pt insurance starts 10/18/16,.

## 2016-08-03 NOTE — Telephone Encounter (Signed)
Rx sent.  LMTCB for pt to advise

## 2016-08-21 ENCOUNTER — Other Ambulatory Visit: Payer: Self-pay | Admitting: Internal Medicine

## 2016-09-18 ENCOUNTER — Other Ambulatory Visit: Payer: Self-pay | Admitting: Internal Medicine

## 2016-10-19 ENCOUNTER — Encounter: Payer: Self-pay | Admitting: Internal Medicine

## 2016-10-19 ENCOUNTER — Ambulatory Visit (INDEPENDENT_AMBULATORY_CARE_PROVIDER_SITE_OTHER): Payer: 59 | Admitting: Internal Medicine

## 2016-10-19 VITALS — BP 108/62 | HR 54 | Temp 97.6°F | Ht 65.0 in | Wt 120.4 lb

## 2016-10-19 DIAGNOSIS — R251 Tremor, unspecified: Secondary | ICD-10-CM

## 2016-10-19 DIAGNOSIS — F17200 Nicotine dependence, unspecified, uncomplicated: Secondary | ICD-10-CM

## 2016-10-19 DIAGNOSIS — G43109 Migraine with aura, not intractable, without status migrainosus: Secondary | ICD-10-CM

## 2016-10-19 DIAGNOSIS — Z0000 Encounter for general adult medical examination without abnormal findings: Secondary | ICD-10-CM

## 2016-10-19 DIAGNOSIS — B182 Chronic viral hepatitis C: Secondary | ICD-10-CM | POA: Diagnosis not present

## 2016-10-19 LAB — CBC WITH DIFFERENTIAL/PLATELET
Basophils Absolute: 0 cells/uL (ref 0–200)
Basophils Relative: 0 %
Eosinophils Absolute: 164 cells/uL (ref 15–500)
Eosinophils Relative: 2 %
HCT: 47.4 % — ABNORMAL HIGH (ref 35.0–45.0)
Hemoglobin: 16.1 g/dL — ABNORMAL HIGH (ref 11.7–15.5)
Lymphocytes Relative: 25 %
Lymphs Abs: 2050 cells/uL (ref 850–3900)
MCH: 30.3 pg (ref 27.0–33.0)
MCHC: 34 g/dL (ref 32.0–36.0)
MCV: 89.3 fL (ref 80.0–100.0)
MPV: 9.9 fL (ref 7.5–12.5)
Monocytes Absolute: 820 cells/uL (ref 200–950)
Monocytes Relative: 10 %
Neutro Abs: 5166 cells/uL (ref 1500–7800)
Neutrophils Relative %: 63 %
Platelets: 234 10*3/uL (ref 140–400)
RBC: 5.31 MIL/uL — ABNORMAL HIGH (ref 3.80–5.10)
RDW: 13 % (ref 11.0–15.0)
WBC: 8.2 10*3/uL (ref 3.8–10.8)

## 2016-10-19 LAB — TSH: TSH: 5.11 mIU/L — ABNORMAL HIGH

## 2016-10-19 MED ORDER — SUMATRIPTAN SUCCINATE 50 MG PO TABS: ORAL_TABLET | ORAL | 4 refills | Status: DC

## 2016-10-19 MED ORDER — ALBUTEROL SULFATE HFA 108 (90 BASE) MCG/ACT IN AERS: 2.0000 | INHALATION_SPRAY | Freq: Four times a day (QID) | RESPIRATORY_TRACT | 4 refills | Status: DC | PRN

## 2016-10-19 MED ORDER — PAROXETINE HCL 20 MG PO TABS: 20.0000 mg | ORAL_TABLET | Freq: Every day | ORAL | 0 refills | Status: DC

## 2016-10-19 MED ORDER — ESZOPICLONE 3 MG PO TABS: 3.0000 mg | ORAL_TABLET | Freq: Every day | ORAL | 0 refills | Status: DC

## 2016-10-19 MED ORDER — PROPRANOLOL HCL ER 60 MG PO CP24: ORAL_CAPSULE | ORAL | 4 refills | Status: DC

## 2016-10-19 NOTE — Progress Notes (Signed)
Pre visit review using our clinic review tool, if applicable. No additional management support is needed unless otherwise documented below in the visit note. 

## 2016-10-19 NOTE — Progress Notes (Signed)
Subjective:    Patient ID: [name], female    DOB: 06/08/1956, 61 y.o.   MRN: [id number]  HPI  61 year old patient who is seen today in follow-up.  She has not been seen here in almost one year. She has a history of chronic hepatitis C and is status post biopsy about 9 years ago.  She was felt not to be a candidate for present.  Day treatment at that time She has a history of migraine headaches.  The persistent remain well controlled with Imitrex She has a history of anxiety disorder as well as tremor and remains on low-dose propranolol.  She also has a history of depression and remains on proximal 18.  She has been off this medication for several days.  However She has a history of ongoing tobacco use, but is down to about 1 half pack of cigarettes daily. Her only complaint is cold, blue hands on exposure to cold She has a history of chronic insomnia and states that she has been using a daily dose of Lunesta  Past Medical History:  Diagnosis Date  . Bronchitis      Social History   Social History  . Marital status: Married    Spouse name: N/A  . Number of children: N/A  . Years of education: N/A   Occupational History  . Not on file.   Social History Main Topics  . Smoking status: Current Every Day Smoker    Packs/day: 1.00    Types: Cigarettes  . Smokeless tobacco: Never Used  . Alcohol use 0.6 oz/week    1 Shots of liquor per week     Comment: Occasional.  . Drug use: No  . Sexual activity: Not on file   Other Topics Concern  . Not on file   Social History Narrative  . No narrative on file    Past Surgical History:  Procedure Laterality Date  . ABDOMINAL HYSTERECTOMY  1986    Family History  Problem Relation Age of Onset  . Asthma Mother   . Alzheimer's disease Mother   . Hypertension Mother   . Cancer Maternal Aunt   . Cancer Paternal Aunt     Breast    Allergies  Allergen Reactions  . Aspirin     REACTION: unspecified  . Glycopyrrolate      REACTION: unspecified, pt unsure if she is allergic.  . Lactose Intolerance (Gi) Other (See Comments)    Milk only.Patient states that she can eat cheese.  Marland Kitchen Morphine Sulfate     REACTION: unspecified    No current outpatient prescriptions on file prior to visit.   No current facility-administered medications on file prior to visit.     BP 108/62 (BP Location: Left Arm, Patient Position: Sitting, Cuff Size: Normal)   Pulse (!) 54   Temp 97.6 F (36.4 C) (Oral)   Ht '5\' 5"'$  (1.651 m)   Wt 120 lb 6.4 oz (54.6 kg)   SpO2 98%   BMI 20.04 kg/m     Review of Systems  Constitutional: Negative.   HENT: Negative for congestion, dental problem, hearing loss, rhinorrhea, sinus pressure, sore throat and tinnitus.   Eyes: Negative for pain, discharge and visual disturbance.  Respiratory: Negative for cough and shortness of breath.   Cardiovascular: Negative for chest pain, palpitations and leg swelling.  Gastrointestinal: Negative for abdominal distention, abdominal pain, blood in stool, constipation, diarrhea, nausea and vomiting.  Genitourinary: Negative for difficulty urinating, dysuria, flank pain, frequency, hematuria,  pelvic pain, urgency, vaginal bleeding, vaginal discharge and vaginal pain.  Musculoskeletal: Negative for arthralgias, gait problem and joint swelling.  Skin: Negative for rash.  Neurological: Positive for headaches. Negative for dizziness, syncope, speech difficulty, weakness and numbness.  Hematological: Negative for adenopathy.  Psychiatric/Behavioral: Positive for sleep disturbance. Negative for agitation, behavioral problems and dysphoric mood. The patient is nervous/anxious.        Objective:   Physical Exam  Constitutional: She is oriented to person, place, and time. She appears well-developed and well-nourished.  Blood pressure low normal  HENT:  Head: Normocephalic.  Right Ear: External ear normal.  Left Ear: External ear normal.  Mouth/Throat:  Oropharynx is clear and moist.  Eyes: Conjunctivae and EOM are normal. Pupils are equal, round, and reactive to light.  Neck: Normal range of motion. Neck supple. No thyromegaly present.  Cardiovascular: Normal rate, regular rhythm, normal heart sounds and intact distal pulses.   Pulmonary/Chest: Effort normal and breath sounds normal. No respiratory distress. She has no wheezes. She has no rales.  Abdominal: Soft. Bowel sounds are normal. She exhibits no mass. There is no tenderness.  No hepatic enlargement  Musculoskeletal: Normal range of motion.  Lymphadenopathy:    She has no cervical adenopathy.  Neurological: She is alert and oriented to person, place, and time.  Skin: Skin is warm and dry. No rash noted.  Hands are cool to touch and mildly cyanotic Radial and ulnar pulses are intact bilaterally  Psychiatric: She has a normal mood and affect. Her behavior is normal.          Assessment & Plan:   History chronic hepatitis C.  Will refer back to ID/hepatitis clinic Migraine headaches.  Recently controlled on propranolol.  Imitrex Chronic insomnia.  Sleep hygiene issues were discussed.  Patient information supplied Anxiety disorder Ongoing tobacco use.  Total smoking cessation encouraged  We'll check updated lab Refer ID/hepatitis C, chronic  Follow-up here 6 months or as needed All medications updated  Nyoka Cowden

## 2016-10-19 NOTE — Patient Instructions (Addendum)
Infectious disease/hepatitis C clinic  Smoking tobacco is very bad for your health. You should stop smoking immediately.    It is important that you exercise regularly, at least 20 minutes 3 to 4 times per week.  If you develop chest pain or shortness of breath seek  medical attention.  Return in 6 months for follow-up   Insomnia Insomnia is a sleep disorder that makes it difficult to fall asleep or to stay asleep. Insomnia can cause tiredness (fatigue), low energy, difficulty concentrating, mood swings, and poor performance at work or school. There are three different ways to classify insomnia:  Difficulty falling asleep.  Difficulty staying asleep.  Waking up too early in the morning. Any type of insomnia can be long-term (chronic) or short-term (acute). Both are common. Short-term insomnia usually lasts for three months or less. Chronic insomnia occurs at least three times a week for longer than three months. What are the causes? Insomnia may be caused by another condition, situation, or substance, such as:  Anxiety.  Certain medicines.  Gastroesophageal reflux disease (GERD) or other gastrointestinal conditions.  Asthma or other breathing conditions.  Restless legs syndrome, sleep apnea, or other sleep disorders.  Chronic pain.  Menopause. This may include hot flashes.  Stroke.  Abuse of alcohol, tobacco, or illegal drugs.  Depression.  Caffeine.  Neurological disorders, such as Alzheimer disease.  An overactive thyroid (hyperthyroidism). The cause of insomnia may not be known. What increases the risk? Risk factors for insomnia include:  Gender. Women are more commonly affected than men.  Age. Insomnia is more common as you get older.  Stress. This may involve your professional or personal life.  Income. Insomnia is more common in people with lower income.  Lack of exercise.  Irregular work schedule or night shifts.  Traveling between different time  zones. What are the signs or symptoms? If you have insomnia, trouble falling asleep or trouble staying asleep is the main symptom. This may lead to other symptoms, such as:  Feeling fatigued.  Feeling nervous about going to sleep.  Not feeling rested in the morning.  Having trouble concentrating.  Feeling irritable, anxious, or depressed. How is this treated? Treatment for insomnia depends on the cause. If your insomnia is caused by an underlying condition, treatment will focus on addressing the condition. Treatment may also include:  Medicines to help you sleep.  Counseling or therapy.  Lifestyle adjustments. Follow these instructions at home:  Take medicines only as directed by your health care provider.  Keep regular sleeping and waking hours. Avoid naps.  Keep a sleep diary to help you and your health care provider figure out what could be causing your insomnia. Include:  When you sleep.  When you wake up during the night.  How well you sleep.  How rested you feel the next day.  Any side effects of medicines you are taking.  What you eat and drink.  Make your bedroom a comfortable place where it is easy to fall asleep:  Put up shades or special blackout curtains to block light from outside.  Use a white noise machine to block noise.  Keep the temperature cool.  Exercise regularly as directed by your health care provider. Avoid exercising right before bedtime.  Use relaxation techniques to manage stress. Ask your health care provider to suggest some techniques that may work well for you. These may include:  Breathing exercises.  Routines to release muscle tension.  Visualizing peaceful scenes.  Cut back on alcohol,  caffeinated beverages, and cigarettes, especially close to bedtime. These can disrupt your sleep.  Do not overeat or eat spicy foods right before bedtime. This can lead to digestive discomfort that can make it hard for you to sleep.  Limit  screen use before bedtime. This includes:  Watching TV.  Using your smartphone, tablet, and computer.  Stick to a routine. This can help you fall asleep faster. Try to do a quiet activity, brush your teeth, and go to bed at the same time each night.  Get out of bed if you are still awake after 15 minutes of trying to sleep. Keep the lights down, but try reading or doing a quiet activity. When you feel sleepy, go back to bed.  Make sure that you drive carefully. Avoid driving if you feel very sleepy.  Keep all follow-up appointments as directed by your health care provider. This is important. Contact a health care provider if:  You are tired throughout the day or have trouble in your daily routine due to sleepiness.  You continue to have sleep problems or your sleep problems get worse. Get help right away if:  You have serious thoughts about hurting yourself or someone else. This information is not intended to replace advice given to you by your health care provider. Make sure you discuss any questions you have with your health care provider. Document Released: 09/14/2000 Document Revised: 02/17/2016 Document Reviewed: 06/18/2014 Elsevier Interactive Patient Education  2017 [employer].

## 2016-10-20 LAB — LIPID PANEL
Cholesterol: 216 mg/dL — ABNORMAL HIGH (ref <200)
HDL: 45 mg/dL — ABNORMAL LOW (ref >50)
LDL Cholesterol: 124 mg/dL — ABNORMAL HIGH (ref <100)
Total CHOL/HDL Ratio: 4.8 Ratio (ref <5.0)
Triglycerides: 236 mg/dL — ABNORMAL HIGH (ref <150)
VLDL: 47 mg/dL — ABNORMAL HIGH (ref <30)

## 2016-10-20 LAB — COMPREHENSIVE METABOLIC PANEL
ALT: 42 U/L — ABNORMAL HIGH (ref 6–29)
AST: 35 U/L (ref 10–35)
Albumin: 4.5 g/dL (ref 3.6–5.1)
Alkaline Phosphatase: 94 U/L (ref 33–130)
BUN: 16 mg/dL (ref 7–25)
CO2: 23 mmol/L (ref 20–31)
Calcium: 10 mg/dL (ref 8.6–10.4)
Chloride: 102 mmol/L (ref 98–110)
Creat: 0.77 mg/dL (ref 0.50–0.99)
Glucose, Bld: 97 mg/dL (ref 65–99)
Potassium: 4.5 mmol/L (ref 3.5–5.3)
Sodium: 136 mmol/L (ref 135–146)
Total Bilirubin: 0.7 mg/dL (ref 0.2–1.2)
Total Protein: 7.9 g/dL (ref 6.1–8.1)

## 2016-12-14 ENCOUNTER — Ambulatory Visit (INDEPENDENT_AMBULATORY_CARE_PROVIDER_SITE_OTHER): Payer: BLUE CROSS/BLUE SHIELD | Admitting: Internal Medicine

## 2016-12-14 ENCOUNTER — Encounter: Payer: Self-pay | Admitting: Internal Medicine

## 2016-12-14 VITALS — BP 122/70 | HR 89 | Temp 97.8°F | Ht 65.0 in | Wt 131.4 lb

## 2016-12-14 DIAGNOSIS — R55 Syncope and collapse: Secondary | ICD-10-CM

## 2016-12-14 DIAGNOSIS — R7989 Other specified abnormal findings of blood chemistry: Secondary | ICD-10-CM

## 2016-12-14 DIAGNOSIS — R946 Abnormal results of thyroid function studies: Secondary | ICD-10-CM | POA: Diagnosis not present

## 2016-12-14 DIAGNOSIS — R000 Tachycardia, unspecified: Secondary | ICD-10-CM

## 2016-12-14 NOTE — Patient Instructions (Signed)
Your EKG is normal.  sound like near fainting you haven't had these before. It is possible that stopping the propranolol is treating something that prevented this from happening. However since you've been off the medicine a while and not going to have you go back on yet. He will be contacted about getting an echocardiogram of your heart. Make follow-up appointment with Dr. Raliegh Ip however if having any recurrence of these symptoms contact us and we may get cardiology involved.  Stay hydrated   Eat healthy . Agree with stopping tobacco .

## 2016-12-14 NOTE — Progress Notes (Signed)
Chief Complaint  Patient presents with  . Dizziness    fell in 2/22, also seeing blacks spots   . Nausea  . Headache    HPI: [name] 61 y.o.  SDA  pcp NA  Hx of hep c anxiety tremor tobacco and  MHAs   Last seen byt pcp in JAn 18 comes in for patt for Had to episodes of lightheaded? Ness    Sitting at [employer] table and  Blacked out    And feell to floor  Hit nose    Second time  Sitting dining table And felt light like spots but no  Loc  laid down  Heart rate was fast and  Went to bed and had HA after that,   Last night felt same  Sat down on floor   Didn't pass out  Had black dots outside vision .   HR was pounding again.   And then HA  And nausea.  Between evening.  After eating.  Stays thirsty  Mom passes Jan 21  Stopped taking  Inderal .la   Cause tremors gone and some sob  As smoker .  No new meds. Week jan 21.  Ran out  lunesta   So stopped.  Feb 1 st.  Walk at work   [employer] .    No exercise sx changes  .   No hx of dx arryhtmia but was told in past had anxiety attacks   This feels different .  ROS: See pertinent positives and negatives per HPI. Neg  AD  Past Medical History:  Diagnosis Date  . Bronchitis     Family History  Problem Relation Age of Onset  . Asthma Mother   . Alzheimer's disease Mother   . Hypertension Mother   . Cancer Maternal Aunt   . Cancer Paternal Aunt     Breast    Social History   Social History  . Marital status: Married    Spouse name: N/A  . Number of children: N/A  . Years of education: N/A   Social History Main Topics  . Smoking status: Current Every Day Smoker    Packs/day: 1.00    Types: Cigarettes  . Smokeless tobacco: Never Used  . Alcohol use 0.6 oz/week    1 Shots of liquor per week     Comment: Occasional.  . Drug use: No  . Sexual activity: Not Asked   Other Topics Concern  . None   Social History Narrative  . None    Outpatient Medications Prior to Visit  Medication Sig Dispense Refill    . albuterol (PROVENTIL HFA;VENTOLIN HFA) 108 (90 Base) MCG/ACT inhaler Inhale 2 puffs into the lungs every 6 (six) hours as needed for shortness of breath. 18 g 4  . PARoxetine (PAXIL) 20 MG tablet Take 1 tablet (20 mg total) by mouth daily. 60 tablet 0  . SUMAtriptan (IMITREX) 50 MG tablet TAKE 1 TABLET BY MOUTH AS NEEDED FOR MIGRAINE. MAY REPEAT ONCE IN 2 HOURS IF HEADACHE PERSISTS OR RECURS 10 tablet 4  . propranolol ER (INDERAL LA) 60 MG 24 hr capsule TAKE ONE CAPSULE BY MOUTH EVERY DAY. PLEASE SCHEDULE APPOINTMENT FOR ANNUAL EXAM BEFORE MORE REFILLS. (Patient not taking: Reported on 12/14/2016) 90 capsule 4  . Eszopiclone 3 MG TABS Take 1 tablet (3 mg total) by mouth at bedtime. Take immediately before bedtime 30 tablet 0   No facility-administered medications prior to visit.  EXAM:  BP 122/70 (BP Location: Left Arm, Patient Position: Sitting, Cuff Size: Normal)   Pulse 89   Temp 97.8 F (36.6 C) (Oral)   Ht '5\' 5"'$  (1.651 m)   Wt 131 lb 6.4 oz (59.6 kg)   BMI 21.87 kg/m   Body mass index is 21.87 kg/m.  GENERAL: vitals reviewed and listed above, alert, oriented, appears well hydrated and in no acute distress HEENT: atraumatic, conjunctiva  clear, no obvious abnormalities on inspection of external nose and ears OP : no lesion edema or exudate  Tongue midline  NECK: no obvious masses on inspection palpation  LUNGS: clear to auscultation bilaterally, no wheezes, rales or rhonchi,  Dec air movement  CV: HRRR,  Rate 90  No g m distant? no clubbing cyanosis or  peripheral edema nl cap refill  MS: moves all extremities without noticeable focal  Abnormality Abdomen:  Sof,t normal bowel sounds without hepatosplenomegaly, no guarding rebound or masses no CVA tenderness neruo gossly non focal  PSYCH: pleasant and cooperative, no obvious depression or anxiety Lab Results  Component Value Date   WBC 8.2 10/19/2016   HGB 16.1 (H) 10/19/2016   HCT 47.4 (H) 10/19/2016   PLT 234  10/19/2016   GLUCOSE 97 10/19/2016   CHOL 216 (H) 10/19/2016   TRIG 236 (H) 10/19/2016   HDL 45 (L) 10/19/2016   LDLCALC 124 (H) 10/19/2016   ALT 42 (H) 10/19/2016   AST 35 10/19/2016   NA 136 10/19/2016   K 4.5 10/19/2016   CL 102 10/19/2016   CREATININE 0.77 10/19/2016   BUN 16 10/19/2016   CO2 23 10/19/2016   TSH 5.11 (H) 10/19/2016   INR 0.8 10/10/2007   Wt Readings from Last 3 Encounters:  12/14/16 131 lb 6.4 oz (59.6 kg)  10/19/16 120 lb 6.4 oz (54.6 kg)  11/16/15 122 lb (55.3 kg)   BP Readings from Last 3 Encounters:  12/14/16 122/70  10/19/16 108/62  11/16/15 110/80  ekg   ASSESSMENT AND PLAN:  Discussed the following assessment and plan:  Syncope, unspecified syncope type - Plan: EKG 12-Lead, ECHOCARDIOGRAM COMPLETE  Increased heart rate - Plan: EKG 12-Lead, ECHOCARDIOGRAM COMPLETE  Abnormal TSH - tsh 5  Syncope and near-syncope unknown cause. States has rapid heart rate but is not related to an anxiety attack. Interesting that she stopped the propranolol over a month ago possibility if it with his treating an underlying problem. Doesn't some like rebound as it has been so far from the discontinuation. Consider tachycardia arrhythmia versus other. EKG is normal as well as exam otherwise. Her labs reviewed a month or so ago were within normal limits and unrevealing. Plan echocardiogram and follow-up visit with her primary Dr. Raliegh Ip consideration of seeing cardiology if recurrent syncope near syncope. She is often number of the medicines that would be concerning for this. -Patient advised to return or notify health care team  if symptoms worsen ,persist or new concerns arise.  Patient Instructions  Your EKG is normal.  sound like near fainting you haven't had these before. It is possible that stopping the propranolol is treating something that prevented this from happening. However since you've been off the medicine a while and not going to have you go back on  yet. He will be contacted about getting an echocardiogram of your heart. Make follow-up appointment with Dr. Raliegh Ip however if having any recurrence of these symptoms contact us and we may get cardiology involved.  Stay hydrated   Eat healthy .  Agree with stopping tobacco .     Standley Brooking. Panosh M.D.

## 2017-02-08 ENCOUNTER — Encounter: Payer: Self-pay | Admitting: Internal Medicine

## 2017-02-08 ENCOUNTER — Ambulatory Visit (INDEPENDENT_AMBULATORY_CARE_PROVIDER_SITE_OTHER): Payer: BLUE CROSS/BLUE SHIELD | Admitting: Internal Medicine

## 2017-02-08 VITALS — BP 120/88 | HR 70 | Temp 98.2°F | Wt 128.2 lb

## 2017-02-08 DIAGNOSIS — R55 Syncope and collapse: Secondary | ICD-10-CM

## 2017-02-08 DIAGNOSIS — G43019 Migraine without aura, intractable, without status migrainosus: Secondary | ICD-10-CM

## 2017-02-08 DIAGNOSIS — R251 Tremor, unspecified: Secondary | ICD-10-CM

## 2017-02-08 DIAGNOSIS — R000 Tachycardia, unspecified: Secondary | ICD-10-CM | POA: Diagnosis not present

## 2017-02-08 NOTE — Progress Notes (Signed)
Subjective:    Patient ID: [name], female    DOB: 1956-01-24, 61 y.o.   MRN: [id number]  HPI  61 year old patient who is seen today in follow-up. The patient experienced a syncopal episode in late February.  This was proceeded by dizziness, weakness, and frank syncope when She attempted to stand. Associated symptoms included rapid heart rate.  The patient has been on Inderal in the past for treatment of an essential tremor.  The patient had been off Inderal  but has subsequently resumed this medication.  She has had no further symptoms. She does describe other episodes of dizziness associated with palpitations. She does have a history mild asthma and ongoing tobacco use.  She does use albuterol periodically. She admits to considerable situational stress.  Following the death of her mother in November 05, 2022  Past Medical History:  Diagnosis Date  . Bronchitis      Social History   Social History  . Marital status: Married    Spouse name: N/A  . Number of children: N/A  . Years of education: N/A   Occupational History  . Not on file.   Social History Main Topics  . Smoking status: Current Every Day Smoker    Packs/day: 1.00    Types: Cigarettes  . Smokeless tobacco: Never Used  . Alcohol use 0.6 oz/week    1 Shots of liquor per week     Comment: Occasional.  . Drug use: No  . Sexual activity: Not on file   Other Topics Concern  . Not on file   Social History Narrative  . No narrative on file    Past Surgical History:  Procedure Laterality Date  . ABDOMINAL HYSTERECTOMY  1986    Family History  Problem Relation Age of Onset  . Asthma Mother   . Alzheimer's disease Mother   . Hypertension Mother   . Cancer Maternal Aunt   . Cancer Paternal Aunt        Breast    Allergies  Allergen Reactions  . Aspirin     REACTION: unspecified  . Glycopyrrolate     REACTION: unspecified, pt unsure if she is allergic.  . Lactose Intolerance (Gi) Other (See Comments)      Milk only.Patient states that she can eat cheese.  Marland Kitchen Morphine Sulfate     REACTION: unspecified    Current Outpatient Prescriptions on File Prior to Visit  Medication Sig Dispense Refill  . albuterol (PROVENTIL HFA;VENTOLIN HFA) 108 (90 Base) MCG/ACT inhaler Inhale 2 puffs into the lungs every 6 (six) hours as needed for shortness of breath. 18 g 4  . PARoxetine (PAXIL) 20 MG tablet Take 1 tablet (20 mg total) by mouth daily. 60 tablet 0  . propranolol ER (INDERAL LA) 60 MG 24 hr capsule TAKE ONE CAPSULE BY MOUTH EVERY DAY. PLEASE SCHEDULE APPOINTMENT FOR ANNUAL EXAM BEFORE MORE REFILLS. 90 capsule 4  . SUMAtriptan (IMITREX) 50 MG tablet TAKE 1 TABLET BY MOUTH AS NEEDED FOR MIGRAINE. MAY REPEAT ONCE IN 2 HOURS IF HEADACHE PERSISTS OR RECURS 10 tablet 4   No current facility-administered medications on file prior to visit.     BP 120/88 (BP Location: Left Arm, Patient Position: Sitting, Cuff Size: Normal)   Pulse 70   Temp 98.2 F (36.8 C) (Oral)   Wt 128 lb 3.2 oz (58.2 kg)   SpO2 98%   BMI 21.33 kg/m     Review of Systems  HENT: Negative for congestion, dental problem, hearing loss,  rhinorrhea, sinus pressure, sore throat and tinnitus.   Eyes: Negative for pain, discharge and visual disturbance.  Respiratory: Negative for cough and shortness of breath.   Cardiovascular: Positive for palpitations. Negative for chest pain and leg swelling.  Gastrointestinal: Negative for abdominal distention, abdominal pain, blood in stool, constipation, diarrhea, nausea and vomiting.  Genitourinary: Negative for difficulty urinating, dysuria, flank pain, frequency, hematuria, pelvic pain, urgency, vaginal bleeding, vaginal discharge and vaginal pain.  Musculoskeletal: Negative for arthralgias, gait problem and joint swelling.  Skin: Negative for rash.  Neurological: Positive for dizziness, syncope, weakness, light-headedness and headaches. Negative for speech difficulty and numbness.   Hematological: Negative for adenopathy.  Psychiatric/Behavioral: Negative for agitation, behavioral problems and dysphoric mood. The patient is not nervous/anxious.        Objective:   Physical Exam  Constitutional: She is oriented to person, place, and time. She appears well-developed and well-nourished.  HENT:  Head: Normocephalic.  Right Ear: External ear normal.  Left Ear: External ear normal.  Mouth/Throat: Oropharynx is clear and moist.  Eyes: Conjunctivae and EOM are normal. Pupils are equal, round, and reactive to light.  Neck: Normal range of motion. Neck supple. No thyromegaly present.  Cardiovascular: Normal rate, regular rhythm, normal heart sounds and intact distal pulses.   Pulmonary/Chest: Effort normal and breath sounds normal.  Abdominal: Soft. Bowel sounds are normal. She exhibits no mass. There is no tenderness.  Musculoskeletal: Normal range of motion.  Lymphadenopathy:    She has no cervical adenopathy.  Neurological: She is alert and oriented to person, place, and time.  Skin: Skin is warm and dry. No rash noted.  Psychiatric: She has a normal mood and affect. Her behavior is normal.          Assessment & Plan:   History of syncope.  She has had some episodes of near-syncope that sound vasovagal.  She has had no recurrent symptoms since resuming beta blocker therapy.  The episode in late February was preceded by weakness, dizziness and lightheadedness, but there was no obvious trigger for a vasovagal reaction.  All episodes are associated with tachycardia.  Doubt patient has a primary SVT.  Options discussed.  Patient has done well on beta blocker therapy.  We'll continue and observe at this time.  The patient has had a normal EKG and 2-D echocardiogram  Nyoka Cowden

## 2017-02-08 NOTE — Patient Instructions (Addendum)
WE NOW OFFER   [name] FAST TRACK!!!  SAME DAY Appointments for ACUTE CARE  Such as: Sprains, Injuries, cuts, abrasions, rashes, muscle pain, joint pain, back pain Colds, flu, sore throats, headache, allergies, cough, fever  Ear pain, sinus and eye infections Abdominal pain, nausea, vomiting, diarrhea, upset stomach Animal/insect bites  3 Easy Ways to Schedule: Walk-In Scheduling Call in scheduling Mychart Sign-up: [email or web address]   Limit your sodium (Salt) intake  Please check your blood pressure on a regular basis.  If it is consistently greater than 150/90, please make an office appointment.  Call or return to clinic prn if these symptoms worsen or fail to improve as anticipated.

## 2017-03-12 ENCOUNTER — Telehealth: Payer: Self-pay | Admitting: Internal Medicine

## 2017-03-12 NOTE — Telephone Encounter (Signed)
Called the patient and LVM for her to call and schedule her echo.

## 2017-04-17 ENCOUNTER — Other Ambulatory Visit: Payer: Self-pay

## 2017-04-17 ENCOUNTER — Telehealth: Payer: Self-pay

## 2017-04-17 DIAGNOSIS — M19049 Primary osteoarthritis, unspecified hand: Secondary | ICD-10-CM

## 2017-04-17 HISTORY — DX: Primary osteoarthritis, unspecified hand: M19.049

## 2017-04-17 NOTE — Telephone Encounter (Signed)
Pharmacy has faxed refill request on Paxil. Last refill was 10/19/16, #60, 0 refills.   Please advise. Thanks!

## 2017-04-19 MED ORDER — PAROXETINE HCL 20 MG PO TABS: 20.0000 mg | ORAL_TABLET | Freq: Every day | ORAL | 0 refills | Status: DC

## 2017-04-19 NOTE — Telephone Encounter (Signed)
Rx phoned into pharmacy.

## 2017-04-19 NOTE — Telephone Encounter (Signed)
Okay for refill?  

## 2017-05-07 ENCOUNTER — Encounter: Payer: Self-pay | Admitting: Internal Medicine

## 2017-05-07 ENCOUNTER — Ambulatory Visit (INDEPENDENT_AMBULATORY_CARE_PROVIDER_SITE_OTHER): Payer: BLUE CROSS/BLUE SHIELD | Admitting: Internal Medicine

## 2017-05-07 VITALS — BP 122/68 | HR 92 | Temp 97.8°F | Ht 65.0 in | Wt 127.2 lb

## 2017-05-07 DIAGNOSIS — M545 Low back pain: Secondary | ICD-10-CM

## 2017-05-07 MED ORDER — METHYLPREDNISOLONE ACETATE 80 MG/ML IJ SUSP
80.0000 mg | Freq: Once | INTRAMUSCULAR | Status: AC
  Administered 2017-05-07: 80 mg via INTRAMUSCULAR

## 2017-05-07 NOTE — Patient Instructions (Addendum)
You  may move around, but avoid painful motions and activities.  Apply heat  to the sore area for 15 to 20 minutes 3 or 4 times daily for the next two to 3 days.   Back Exercises The following exercises strengthen the muscles that help to support the back. They also help to keep the lower back flexible. Doing these exercises can help to prevent back pain or lessen existing pain. If you have back pain or discomfort, try doing these exercises 2-3 times each day or as told by your health care provider. When the pain goes away, do them once each day, but increase the number of times that you repeat the steps for each exercise (do more repetitions). If you do not have back pain or discomfort, do these exercises once each day or as told by your health care provider. Exercises Single Knee to Chest  Repeat these steps 3-5 times for each leg: 1. Lie on your back on a firm bed or the floor with your legs extended. 2. Bring one knee to your chest. Your other leg should stay extended and in contact with the floor. 3. Hold your knee in place by grabbing your knee or thigh. 4. Pull on your knee until you feel a gentle stretch in your lower back. 5. Hold the stretch for 10-30 seconds. 6. Slowly release and straighten your leg.  Pelvic Tilt  Repeat these steps 5-10 times: 1. Lie on your back on a firm bed or the floor with your legs extended. 2. Bend your knees so they are pointing toward the ceiling and your feet are flat on the floor. 3. Tighten your lower abdominal muscles to press your lower back against the floor. This motion will tilt your pelvis so your tailbone points up toward the ceiling instead of pointing to your feet or the floor. 4. With gentle tension and even breathing, hold this position for 5-10 seconds.  Cat-Cow  Repeat these steps until your lower back becomes more flexible: 1. Get into a hands-and-knees position on a firm surface. Keep your hands under your shoulders, and keep your  knees under your hips. You may place padding under your knees for comfort. 2. Let your head hang down, and point your tailbone toward the floor so your lower back becomes rounded like the back of a cat. 3. Hold this position for 5 seconds. 4. Slowly lift your head and point your tailbone up toward the ceiling so your back forms a sagging arch like the back of a cow. 5. Hold this position for 5 seconds.  Press-Ups  Repeat these steps 5-10 times: 1. Lie on your abdomen (face-down) on the floor. 2. Place your palms near your head, about shoulder-width apart. 3. While you keep your back as relaxed as possible and keep your hips on the floor, slowly straighten your arms to raise the top half of your body and lift your shoulders. Do not use your back muscles to raise your upper torso. You may adjust the placement of your hands to make yourself more comfortable. 4. Hold this position for 5 seconds while you keep your back relaxed. 5. Slowly return to lying flat on the floor.  Bridges  Repeat these steps 10 times: 1. Lie on your back on a firm surface. 2. Bend your knees so they are pointing toward the ceiling and your feet are flat on the floor. 3. Tighten your buttocks muscles and lift your buttocks off of the floor until your waist  is at almost the same height as your knees. You should feel the muscles working in your buttocks and the back of your thighs. If you do not feel these muscles, slide your feet 1-2 inches farther away from your buttocks. 4. Hold this position for 3-5 seconds. 5. Slowly lower your hips to the starting position, and allow your buttocks muscles to relax completely.  If this exercise is too easy, try doing it with your arms crossed over your chest. Abdominal Crunches  Repeat these steps 5-10 times: 1. Lie on your back on a firm bed or the floor with your legs extended. 2. Bend your knees so they are pointing toward the ceiling and your feet are flat on the  floor. 3. Cross your arms over your chest. 4. Tip your chin slightly toward your chest without bending your neck. 5. Tighten your abdominal muscles and slowly raise your trunk (torso) high enough to lift your shoulder blades a tiny bit off of the floor. Avoid raising your torso higher than that, because it can put too much stress on your low back and it does not help to strengthen your abdominal muscles. 6. Slowly return to your starting position.  Back Lifts Repeat these steps 5-10 times: 1. Lie on your abdomen (face-down) with your arms at your sides, and rest your forehead on the floor. 2. Tighten the muscles in your legs and your buttocks. 3. Slowly lift your chest off of the floor while you keep your hips pressed to the floor. Keep the back of your head in line with the curve in your back. Your eyes should be looking at the floor. 4. Hold this position for 3-5 seconds. 5. Slowly return to your starting position.  Contact a health care provider if:  Your back pain or discomfort gets much worse when you do an exercise.  Your back pain or discomfort does not lessen within 2 hours after you exercise. If you have any of these problems, stop doing these exercises right away. Do not do them again unless your health care provider says that you can. Get help right away if:  You develop sudden, severe back pain. If this happens, stop doing the exercises right away. Do not do them again unless your health care provider says that you can. This information is not intended to replace advice given to you by your health care provider. Make sure you discuss any questions you have with your health care provider. Document Released: 10/25/2004 Document Revised: 01/25/2016 Document Reviewed: 11/11/2014 Elsevier Interactive Patient Education  2017 [employer].

## 2017-05-07 NOTE — Progress Notes (Signed)
Subjective:    Patient ID: [name], female    DOB: 1956-07-05, 61 y.o.   MRN: [id number]  HPI  61 year old patient presents with a chief complaint of left lumbar pain of 10 days' duration.  Pain radiates somewhat to the left flank area .She does have a remote history of renal colic in 6285.  This pain was quite severe and much different. Pain is aggravated by movements.  She states that she gets relief from pain by lying on her left side but pain is more significant sleeping on her right side. She also complains of increasing shortness of breath, anxiety, and cramps involving the feet She continues to smoke.  Past Medical History:  Diagnosis Date  . 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 Bronchitis      Social History   Social History  . Marital status: Married    Spouse name: N/A  . Number of children: N/A  . Years of education: N/A   Occupational History  . Not on file.   Social History Main Topics  . Smoking status: Current Every Day Smoker    Packs/day: 1.00    Types: Cigarettes  . Smokeless tobacco: Never Used  . Alcohol use 0.6 oz/week    1 Shots of liquor per week     Comment: Occasional.  . Drug use: No  . Sexual activity: Not on file   Other Topics Concern  . Not on file   Social History Narrative  . No narrative on file    Past Surgical History:  Procedure Laterality Date  . ABDOMINAL HYSTERECTOMY  1986    Family History  Problem Relation Age of Onset  . Asthma Mother   . Alzheimer's disease Mother   . Hypertension Mother   . Cancer Maternal Aunt   . Cancer Paternal Aunt        Breast    Allergies  Allergen Reactions  . Aspirin     REACTION: unspecified  . Glycopyrrolate     REACTION: unspecified, pt unsure if she is allergic.  . Lactose Intolerance (Gi) Other (See Comments)    Milk only.Patient states that she can eat cheese.  Marland Kitchen Morphine Sulfate     REACTION: unspecified    Current Outpatient Prescriptions on File Prior to Visit    Medication Sig Dispense Refill  . albuterol (PROVENTIL HFA;VENTOLIN HFA) 108 (90 Base) MCG/ACT inhaler Inhale 2 puffs into the lungs every 6 (six) hours as needed for shortness of breath. 18 g 4  . PARoxetine (PAXIL) 20 MG tablet Take 1 tablet (20 mg total) by mouth daily. 60 tablet 0  . propranolol ER (INDERAL LA) 60 MG 24 hr capsule TAKE ONE CAPSULE BY MOUTH EVERY DAY. PLEASE SCHEDULE APPOINTMENT FOR ANNUAL EXAM BEFORE MORE REFILLS. 90 capsule 4  . SUMAtriptan (IMITREX) 50 MG tablet TAKE 1 TABLET BY MOUTH AS NEEDED FOR MIGRAINE. MAY REPEAT ONCE IN 2 HOURS IF HEADACHE PERSISTS OR RECURS 10 tablet 4   No current facility-administered medications on file prior to visit.     BP 122/68 (BP Location: Left Arm, Patient Position: Sitting, Cuff Size: Normal)   Pulse 92   Temp 97.8 F (36.6 C) (Oral)   Ht 5\' 5"  (1.651 m)   Wt 127 lb 3.2 oz (57.7 kg)   SpO2 95%   BMI 21.17 kg/m     Review of Systems  Constitutional: Negative.   HENT: Negative for congestion, dental problem, hearing loss, rhinorrhea, sinus pressure, sore throat and tinnitus.  Eyes: Negative for pain, discharge and visual disturbance.  Respiratory: Positive for shortness of breath. Negative for cough.   Cardiovascular: Negative for chest pain, palpitations and leg swelling.  Gastrointestinal: Negative for abdominal distention, abdominal pain, blood in stool, constipation, diarrhea, nausea and vomiting.  Genitourinary: Negative for difficulty urinating, dysuria, flank pain, frequency, hematuria, pelvic pain, urgency, vaginal bleeding, vaginal discharge and vaginal pain.  Musculoskeletal: Positive for back pain and myalgias. Negative for arthralgias, gait problem and joint swelling.  Skin: Negative for rash.  Neurological: Negative for dizziness, syncope, speech difficulty, weakness, numbness and headaches.  Hematological: Negative for adenopathy.  Psychiatric/Behavioral: Negative for agitation, behavioral problems and  dysphoric mood. The patient is nervous/anxious.        Objective:   Physical Exam  Constitutional: She is oriented to person, place, and time. She appears well-developed and well-nourished.  HENT:  Head: Normocephalic.  Right Ear: External ear normal.  Left Ear: External ear normal.  Mouth/Throat: Oropharynx is clear and moist.  Eyes: Pupils are equal, round, and reactive to light. Conjunctivae and EOM are normal.  Neck: Normal range of motion. Neck supple. No thyromegaly present.  Cardiovascular: Normal rate, regular rhythm, normal heart sounds and intact distal pulses.   Pulmonary/Chest: Effort normal and breath sounds normal.  Abdominal: Soft. Bowel sounds are normal. She exhibits no mass. There is no tenderness.  Musculoskeletal: Normal range of motion.  Negative straight leg test  Lymphadenopathy:    She has no cervical adenopathy.  Neurological: She is alert and oriented to person, place, and time.  Skin: Skin is warm and dry. No rash noted.  Psychiatric: She has a normal mood and affect. Her behavior is normal.          Assessment & Plan:   Left lower back pain.  Will treat with Depo-Medrol 80.  Rest.  Patient was given information concerning low back exercises.  She will call if unimproved History migraine headache, stable Tobacco use disorder.  Total smoking cessation encouraged  Nyoka Cowden

## 2017-06-20 ENCOUNTER — Encounter: Payer: Self-pay | Admitting: Internal Medicine

## 2017-07-06 ENCOUNTER — Other Ambulatory Visit: Payer: Self-pay | Admitting: Internal Medicine

## 2017-07-18 ENCOUNTER — Encounter (HOSPITAL_COMMUNITY): Payer: Self-pay

## 2017-07-18 ENCOUNTER — Emergency Department (HOSPITAL_COMMUNITY)
Admission: EM | Admit: 2017-07-18 | Discharge: 2017-07-18 | Disposition: A | Discharge status: Home / Self Care | Payer: BLUE CROSS/BLUE SHIELD | Attending: Emergency Medicine | Admitting: Emergency Medicine

## 2017-07-18 ENCOUNTER — Encounter: Payer: Self-pay | Admitting: Internal Medicine

## 2017-07-18 ENCOUNTER — Emergency Department (HOSPITAL_COMMUNITY): Payer: BLUE CROSS/BLUE SHIELD

## 2017-07-18 ENCOUNTER — Ambulatory Visit (INDEPENDENT_AMBULATORY_CARE_PROVIDER_SITE_OTHER): Payer: BLUE CROSS/BLUE SHIELD | Admitting: Internal Medicine

## 2017-07-18 VITALS — BP 130/70 | HR 78 | Temp 98.1°F | Ht 65.0 in | Wt 137.8 lb

## 2017-07-18 DIAGNOSIS — R911 Solitary pulmonary nodule: Secondary | ICD-10-CM

## 2017-07-18 DIAGNOSIS — R0602 Shortness of breath: Secondary | ICD-10-CM | POA: Diagnosis present

## 2017-07-18 DIAGNOSIS — F17210 Nicotine dependence, cigarettes, uncomplicated: Secondary | ICD-10-CM | POA: Insufficient documentation

## 2017-07-18 DIAGNOSIS — R091 Pleurisy: Secondary | ICD-10-CM | POA: Diagnosis not present

## 2017-07-18 DIAGNOSIS — R0781 Pleurodynia: Secondary | ICD-10-CM

## 2017-07-18 DIAGNOSIS — R071 Chest pain on breathing: Secondary | ICD-10-CM | POA: Diagnosis not present

## 2017-07-18 DIAGNOSIS — Z79899 Other long term (current) drug therapy: Secondary | ICD-10-CM | POA: Insufficient documentation

## 2017-07-18 HISTORY — DX: Unspecified viral hepatitis C without hepatic coma: B19.20

## 2017-07-18 LAB — CBC
HCT: 42.9 % (ref 36.0–46.0)
Hemoglobin: 15.3 g/dL — ABNORMAL HIGH (ref 12.0–15.0)
MCH: 31.5 pg (ref 26.0–34.0)
MCHC: 35.7 g/dL (ref 30.0–36.0)
MCV: 88.5 fL (ref 78.0–100.0)
Platelets: 272 10*3/uL (ref 150–400)
RBC: 4.85 MIL/uL (ref 3.87–5.11)
RDW: 12.7 % (ref 11.5–15.5)
WBC: 6.3 10*3/uL (ref 4.0–10.5)

## 2017-07-18 LAB — I-STAT TROPONIN, ED: Troponin i, poc: 0 ng/mL (ref 0.00–0.08)

## 2017-07-18 LAB — BASIC METABOLIC PANEL
Anion gap: 9 (ref 5–15)
BUN: 24 mg/dL — ABNORMAL HIGH (ref 6–20)
CO2: 20 mmol/L — ABNORMAL LOW (ref 22–32)
Calcium: 9.1 mg/dL (ref 8.9–10.3)
Chloride: 105 mmol/L (ref 101–111)
Creatinine, Ser: 0.69 mg/dL (ref 0.44–1.00)
GFR calc Af Amer: >60 mL/min (ref >60)
GFR calc non Af Amer: >60 mL/min (ref >60)
Glucose, Bld: 127 mg/dL — ABNORMAL HIGH (ref 65–99)
Potassium: 4 mmol/L (ref 3.5–5.1)
Sodium: 134 mmol/L — ABNORMAL LOW (ref 135–145)

## 2017-07-18 LAB — HEPATIC FUNCTION PANEL
ALT: 48 U/L (ref 14–54)
AST: 32 U/L (ref 15–41)
Albumin: 4 g/dL (ref 3.5–5.0)
Alkaline Phosphatase: 111 U/L (ref 38–126)
Bilirubin, Direct: <0.1 mg/dL — ABNORMAL LOW (ref 0.1–0.5)
Total Bilirubin: 0.7 mg/dL (ref 0.3–1.2)
Total Protein: 7.8 g/dL (ref 6.5–8.1)

## 2017-07-18 LAB — D-DIMER, QUANTITATIVE: D-Dimer, Quant: 2.18 ug/mL-FEU — ABNORMAL HIGH (ref 0.00–0.50)

## 2017-07-18 LAB — BRAIN NATRIURETIC PEPTIDE: B Natriuretic Peptide: 29.1 pg/mL (ref 0.0–100.0)

## 2017-07-18 MED ORDER — IOPAMIDOL (ISOVUE-370) INJECTION 76%
INTRAVENOUS | Status: AC
  Administered 2017-07-18: 80 mL via INTRAVENOUS
  Filled 2017-07-18: qty 100

## 2017-07-18 NOTE — ED Provider Notes (Signed)
[facility] [facility] DEPT Provider Note   CSN: [id number] Arrival date & time: 07/18/17  1615     History   Chief Complaint Chief Complaint  Patient presents with  . Shortness of Breath  . Pleurisy  . Bloated    HPI [name] is a 61 y.o. female with history of bronchitis, hepatitis C who presents with 1 day history of shortness of breath and pleuritic right-sided chest pain, which radiates straight through to her back.  Patient was seen by her PCP today diagnosed with pleurisy, but sent here to rule out blood clot.  Patient did just recently take a trip to [place] where she had 10 hours in the car, however she did make stops.  She denies any recent surgeries, known cancer, exogenous estrogen use, new leg pain or swelling, history of blood clots.  She is a smoker.  She reports shortness of breath only when she moves.  She reports that shortness of breath may be due to her pain.  She reports that she has been feeling more short of breath than normal for the past few months, however it became much worse today.  Patient also reports bloating over the past few weeks of her abdomen.  She denies any pain, nausea, or vomiting.  Patient's LFTs on 10/19/2016 showed AST within normal limits and mild elevation of ALT (42).  HPI  Past Medical History:  Diagnosis Date  . 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 Bronchitis   . Hepatitis C     Patient Active Problem List   Diagnosis Date Noted  . 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 Hepatitis C, chronic ([place]) 03/14/2015  . Tobacco use disorder 01/21/2014  . Tremor 10/14/2012  . Migraine headache 08/01/2010  . DEPRESSION 03/19/2008  . GERD 03/19/2008  . ABSCESS, PERIRECTAL 03/19/2008  . NEPHROLITHIASIS, HX OF 03/19/2008    Past Surgical History:  Procedure Laterality Date  . ABDOMINAL HYSTERECTOMY  1986    OB History    No data available       Home Medications    Prior to Admission medications   Medication Sig  Start Date End Date Taking? Authorizing Provider  albuterol (PROVENTIL HFA;VENTOLIN HFA) 108 (90 Base) MCG/ACT inhaler Inhale 2 puffs into the lungs every 6 (six) hours as needed for shortness of breath. 10/19/16  Yes Marletta Lor, MD  ibuprofen (ADVIL,MOTRIN) 200 MG tablet Take 200 mg by mouth every 6 (six) hours as needed for mild pain or cramping.   Yes [provider]  PARoxetine (PAXIL) 20 MG tablet TAKE 1 TABLET BY MOUTH ONCE DAILY 07/08/17  Yes Marletta Lor, MD  propranolol ER (INDERAL LA) 60 MG 24 hr capsule TAKE ONE CAPSULE BY MOUTH EVERY DAY. PLEASE SCHEDULE APPOINTMENT FOR ANNUAL EXAM BEFORE MORE REFILLS. 10/19/16  Yes Marletta Lor, MD  XIIDRA 5 % SOLN Place 1 drop into both eyes 2 (two) times daily. 07/10/17  Yes [provider]  SUMAtriptan (IMITREX) 50 MG tablet TAKE 1 TABLET BY MOUTH AS NEEDED FOR MIGRAINE. MAY REPEAT ONCE IN 2 HOURS IF HEADACHE PERSISTS OR RECURS Patient not taking: Reported on 07/18/2017 10/19/16   Marletta Lor, MD    Family History Family History  Problem Relation Age of Onset  . Asthma Mother   . Alzheimer's disease Mother   . Hypertension Mother   . Cancer Maternal Aunt   . Cancer Paternal Aunt        Breast    Social History Social  History  Substance Use Topics  . Smoking status: Current Every Day Smoker    Packs/day: 0.75    Types: Cigarettes  . Smokeless tobacco: Never Used  . Alcohol use 0.6 oz/week    1 Shots of liquor per week     Comment: Occasional.     Allergies   Aspirin; Glycopyrrolate; Lactose intolerance (gi); and Morphine sulfate   Review of Systems Review of Systems  Constitutional: Negative for chills and fever.  HENT: Negative for facial swelling and sore throat.   Respiratory: Positive for shortness of breath.   Cardiovascular: Positive for chest pain.  Gastrointestinal: Positive for abdominal distention. Negative for abdominal pain, nausea and vomiting.  Genitourinary:  Negative for dysuria.  Musculoskeletal: Negative for back pain.  Skin: Negative for rash and wound.  Neurological: Negative for headaches.  Psychiatric/Behavioral: The patient is not nervous/anxious.      Physical Exam Updated Vital Signs BP (!) 141/76 (BP Location: Right Arm)   Pulse 70   Temp 97.8 F (36.6 C) (Oral)   Resp 17   Ht 5\' 5"  (1.651 m)   Wt 62.6 kg (138 lb)   SpO2 96%   BMI 22.96 kg/m   Physical Exam  Constitutional: She appears well-developed and well-nourished. No distress.  HENT:  Head: Normocephalic and atraumatic.  Mouth/Throat: Oropharynx is clear and moist. No oropharyngeal exudate.  Eyes: Pupils are equal, round, and reactive to light. Conjunctivae are normal. Right eye exhibits no discharge. Left eye exhibits no discharge. No scleral icterus.  Neck: Normal range of motion. Neck supple. No thyromegaly present.  Cardiovascular: Normal rate, regular rhythm, normal heart sounds and intact distal pulses.  Exam reveals no gallop and no friction rub.   No murmur heard. Pulmonary/Chest: Effort normal and breath sounds normal. No stridor. No respiratory distress. She has no wheezes. She has no rales.  Abdominal: Soft. Bowel sounds are normal. She exhibits no distension. There is no tenderness. There is no rebound and no guarding.  Musculoskeletal: She exhibits no edema.  No calf TTP bilaterally  Lymphadenopathy:    She has no cervical adenopathy.  Neurological: She is alert. Coordination normal.  Skin: Skin is warm and dry. No rash noted. She is not diaphoretic. No pallor.  Psychiatric: She has a normal mood and affect.  Nursing note and vitals reviewed.    ED Treatments / Results  Labs (all labs ordered are listed, but only abnormal results are displayed) Labs Reviewed  BASIC METABOLIC PANEL - Abnormal; Notable for the following:       Result Value   Sodium 134 (*)    CO2 20 (*)    Glucose, Bld 127 (*)    BUN 24 (*)    All other components within  normal limits  CBC - Abnormal; Notable for the following:    Hemoglobin 15.3 (*)    All other components within normal limits  D-DIMER, QUANTITATIVE (NOT AT [facility]) - Abnormal; Notable for the following:    D-Dimer, Quant 2.18 (*)    All other components within normal limits  HEPATIC FUNCTION PANEL - Abnormal; Notable for the following:    Bilirubin, Direct <0.1 (*)    All other components within normal limits  BRAIN NATRIURETIC PEPTIDE  I-STAT TROPONIN, ED    EKG  EKG Interpretation  Date/Time:  Thursday July 18 2017 16:41:01 EDT Ventricular Rate:  70 PR Interval:    QRS Duration: 87 QT Interval:  416 QTC Calculation: 449 R Axis:   17 Text Interpretation:  Sinus rhythm Minimal ST elevation, inferior leads normal. no change fromold Confirmed by Charlesetta Shanks [phone]) on 07/18/2017 6:29:21 PM       Radiology Dg Chest 2 View  Result Date: 07/18/2017 CLINICAL DATA:  Shortness of breath and chest pain EXAM: CHEST  2 VIEW COMPARISON:  10/07/2014 FINDINGS: Lungs are hyperexpanded. The lungs are clear without focal pneumonia, edema, pneumothorax or pleural effusion. The cardiopericardial silhouette is within normal limits for size. The visualized bony structures of the thorax are intact. Telemetry leads overlie the chest. IMPRESSION: No active cardiopulmonary disease. Electronically Signed   By: Misty Stanley M.D.   On: 07/18/2017 17:13   Ct Angio Chest Pe W And/or Wo Contrast  Result Date: 07/18/2017 CLINICAL DATA:  Positive D-dimer back pain and chest pain EXAM: CT ANGIOGRAPHY CHEST WITH CONTRAST TECHNIQUE: Multidetector CT imaging of the chest was performed using the standard protocol during bolus administration of intravenous contrast. Multiplanar CT image reconstructions and MIPs were obtained to evaluate the vascular anatomy. CONTRAST:  80 mL Isovue 370 intravenous COMPARISON:  07/18/2017, 01/20/2013 FINDINGS: Cardiovascular: Satisfactory opacification of the pulmonary arteries  to the segmental level. No evidence of pulmonary embolism. Normal heart size. No pericardial effusion. Nonaneurysmal aorta. No dissection. Scattered atherosclerotic calcification. Mediastinum/Nodes: Midline trachea. No thyroid mass. No significant adenopathy. Esophagus within normal limits. Lungs/Pleura: Mild emphysema. Interval increase in size of a left upper lobe pulmonary nodule, currently measuring 14 by 11 mm with mild surrounding ground-glass density. Previous this measured 7 mm. No consolidation or effusion. Negative for a pneumothorax. Upper Abdomen: No acute abnormality. Musculoskeletal: No chest wall abnormality. No acute or significant osseous findings. Review of the MIP images confirms the above findings. IMPRESSION: 1. Negative for acute pulmonary embolus or aortic dissection 2. 14 mm left upper lobe pulmonary nodule, increased in size compared to prior and therefore felt suspicious for carcinoma. Consider correlation with PET-CT and/or tissue sampling. 3. Mild emphysema Aortic Atherosclerosis (ICD10-I70.0) and Emphysema (ICD10-J43.9). Electronically Signed   By: Donavan Foil M.D.   On: 07/18/2017 19:42    Procedures Procedures (including critical care time)  Medications Ordered in ED Medications  iopamidol (ISOVUE-370) 76 % injection (80 mLs Intravenous Contrast Given 07/18/17 1901)     Initial Impression / Assessment and Plan / ED Course  I have reviewed the triage vital signs and the nursing notes.  Pertinent labs & imaging results that were available during my care of the patient were reviewed by me and considered in my medical decision making (see chart for details).  Clinical Course as of Jul 18 2317  Thu Jul 18, 2017  1900 Elevated D-dimer, CTA Chest ordered. Patient updated.  [AL]    Clinical Course User Index [AL] Frederica Kuster, PA-C    Patient with probable pleurisy or costochondritis as cause of pleuritic, right-sided chest pain.  Troponins negative.  BNP is  negative.  D-dimer 2.18. No PE found on CT angio chest, however increase in size of prior pulmonary nodule concerning for carcinoma.  I discussed this with patient and the importance to follow-up with PCP for further testing including tissue sampling and/or PET-CT. She has not had repeat chest CT since 2014 when nodule first found. Patient is smoker. She understands and will follow-up as soon as possible.  Advised anti-inflammatory treatment for pleuritic pain including ibuprofen.  EKG shows NSR, no change since last tracing.  Strict return precautions discussed.  Follow-up to PCP.  Patient understands and agrees with plan.  Patient vitals stable and discharged  in satisfactory condition.  Final Clinical Impressions(s) / ED Diagnoses   Final diagnoses:  Pleuritic chest pain  Incidental lung nodule, greater than or equal to 18mm    New Prescriptions Discharge Medication List as of 07/18/2017  8:13 PM       Frederica Kuster, PA-C 07/18/17 2318    Charlesetta Shanks, MD 07/27/17 [phone]

## 2017-07-18 NOTE — Patient Instructions (Signed)
Report to [facility] emergency room immediately for further evaluation

## 2017-07-18 NOTE — Progress Notes (Signed)
Subjective:    Patient ID: [name], female    DOB: January 22, 1956, 61 y.o.   MRN: [id number]  HPI  61 year old patient who presents today with a chief complaint of right-sided chest pain.  Pain occurred today abruptly at 9:00 this morning.  Pain initially started in the right subscapular area and presently involves the right lower chest wall area anteriorly, laterally and also in the posterior region.  Pain is described as sharp and worsened by full inspiration.  She states that she has a remote history of pleurisy in the past.  She has had no recent URI symptoms, but one month ago, was treated at an urgent care center for asthmatic bronchitis with prednisone, Levaquin and Symbicort. For the past 5 days.  She is also noted some abdominal bloating and increase in abdominal girth.  She describes some mild constipation She denies any fever, shortness of breath or productive cough.   Past Medical History:  Diagnosis Date  . 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 Bronchitis      Social History   Social History  . Marital status: Married    Spouse name: N/A  . Number of children: N/A  . Years of education: N/A   Occupational History  . Not on file.   Social History Main Topics  . Smoking status: Current Every Day Smoker    Packs/day: 1.00    Types: Cigarettes  . Smokeless tobacco: Never Used  . Alcohol use 0.6 oz/week    1 Shots of liquor per week     Comment: Occasional.  . Drug use: No  . Sexual activity: Not on file   Other Topics Concern  . Not on file   Social History Narrative  . No narrative on file    Past Surgical History:  Procedure Laterality Date  . ABDOMINAL HYSTERECTOMY  1986    Family History  Problem Relation Age of Onset  . Asthma Mother   . Alzheimer's disease Mother   . Hypertension Mother   . Cancer Maternal Aunt   . Cancer Paternal Aunt        Breast    Allergies  Allergen Reactions  . Aspirin     REACTION: unspecified  . Glycopyrrolate       REACTION: unspecified, pt unsure if she is allergic.  . Lactose Intolerance (Gi) Other (See Comments)    Milk only.Patient states that she can eat cheese.  Marland Kitchen Morphine Sulfate     REACTION: unspecified    Current Outpatient Prescriptions on File Prior to Visit  Medication Sig Dispense Refill  . albuterol (PROVENTIL HFA;VENTOLIN HFA) 108 (90 Base) MCG/ACT inhaler Inhale 2 puffs into the lungs every 6 (six) hours as needed for shortness of breath. 18 g 4  . PARoxetine (PAXIL) 20 MG tablet TAKE 1 TABLET BY MOUTH ONCE DAILY 60 tablet 0  . propranolol ER (INDERAL LA) 60 MG 24 hr capsule TAKE ONE CAPSULE BY MOUTH EVERY DAY. PLEASE SCHEDULE APPOINTMENT FOR ANNUAL EXAM BEFORE MORE REFILLS. 90 capsule 4  . SUMAtriptan (IMITREX) 50 MG tablet TAKE 1 TABLET BY MOUTH AS NEEDED FOR MIGRAINE. MAY REPEAT ONCE IN 2 HOURS IF HEADACHE PERSISTS OR RECURS 10 tablet 4   No current facility-administered medications on file prior to visit.     BP 130/70 (BP Location: Left Arm, Patient Position: Sitting, Cuff Size: Normal)   Pulse 78   Temp 98.1 F (36.7 C) (Oral)   Ht 5\' 5"  (1.651 m)   Wt 137 lb  12.8 oz (62.5 kg)   SpO2 97%   BMI 22.93 kg/m     Review of Systems  Constitutional: Negative.   HENT: Negative for congestion, dental problem, hearing loss, rhinorrhea, sinus pressure, sore throat and tinnitus.   Eyes: Negative for pain, discharge and visual disturbance.  Respiratory: Negative for cough and shortness of breath.   Cardiovascular: Positive for chest pain. Negative for palpitations and leg swelling.  Gastrointestinal: Positive for abdominal distention and constipation. Negative for abdominal pain, blood in stool, diarrhea, nausea and vomiting.  Genitourinary: Negative for difficulty urinating, dysuria, flank pain, frequency, hematuria, pelvic pain, urgency, vaginal bleeding, vaginal discharge and vaginal pain.  Musculoskeletal: Negative for arthralgias, gait problem and joint swelling.  Skin:  Negative for rash.  Neurological: Negative for dizziness, syncope, speech difficulty, weakness, numbness and headaches.  Hematological: Negative for adenopathy.  Psychiatric/Behavioral: Negative for agitation, behavioral problems and dysphoric mood. The patient is not nervous/anxious.        Objective:   Physical Exam  Constitutional: She is oriented to person, place, and time. She appears well-developed and well-nourished. No distress.  Uncomfortable but in no acute distress Afebrile Normal blood pressure Pulse 78 O2 saturation 97%  HENT:  Head: Normocephalic.  Right Ear: External ear normal.  Left Ear: External ear normal.  Mouth/Throat: Oropharynx is clear and moist.  Eyes: Pupils are equal, round, and reactive to light. Conjunctivae and EOM are normal.  Neck: Normal range of motion. Neck supple. No thyromegaly present.  Cardiovascular: Normal rate, regular rhythm, normal heart sounds and intact distal pulses.   Pulmonary/Chest: Effort normal and breath sounds normal. She exhibits tenderness.  Tenderness to deep palpation along the right lateral lower chest wall area Breath sounds were slightly diminished at the right base due to splinting but seemed to be aerating both lungs well A suggestion of a pleural rub noted along the right lateral chest wall area  Abdominal: Soft. Bowel sounds are normal. She exhibits no mass. There is no tenderness.  Musculoskeletal: Normal range of motion.  Lymphadenopathy:    She has no cervical adenopathy.  Neurological: She is alert and oriented to person, place, and time.  Skin: Skin is warm and dry. No rash noted.  Psychiatric: She has a normal mood and affect. Her behavior is normal.          Assessment & Plan:   Pleurisy.  Patient is quite uncomfortable and is aware of potentially serious pathology such as a pulmonary embolism as a cause of pleurisy. Patient agreeable to going to [facility] ED for a chest x-ray and further evaluation.   Will also consider a d-dimer and/or chest CT to exclude acute pulmonary embolism  Nyoka Cowden

## 2017-07-18 NOTE — Discharge Instructions (Signed)
Your chest CT did not show a pulmonary embolism today.  However, a 14 mm left upper lobe pulmonary nodule, increased in size compared to prior and therefore felt suspicious for carcinoma. The radiologist is recommending correlation with PET-CT and/or tissue sampling.  Please see your doctor about this as soon as possible for further evaluation and treatment.  Please take ibuprofen as prescribed over-the-counter for the next several days to help with your probable pleurisy or costochondritis.  Please return to the emergency department or see your doctor immediately if you develop any new or worsening symptoms including worsening chest pain or shortness of breath, coughing up or vomiting blood, or any other new or concerning symptoms.

## 2017-07-18 NOTE — ED Triage Notes (Signed)
patient went to PCP because she had a pain in her back that came to the chest area. patient has pain when she takes a deep breath. Patient states she has had SOB for a while , but worse today. Patient was sent by PCP to r/o PE

## 2017-07-19 ENCOUNTER — Telehealth: Payer: Self-pay | Admitting: Internal Medicine

## 2017-07-19 NOTE — Telephone Encounter (Signed)
° ° ° °   Pt call to say she need a note saying that it is ok for her to return back to work on 07/22/17

## 2017-07-22 ENCOUNTER — Encounter: Payer: Self-pay | Admitting: Internal Medicine

## 2017-07-22 ENCOUNTER — Telehealth: Payer: Self-pay | Admitting: Internal Medicine

## 2017-07-22 NOTE — Telephone Encounter (Signed)
Work note was printed out, pt was contacted. Pt states she will be here this afternoon to pickup note.

## 2017-07-22 NOTE — Telephone Encounter (Signed)
Okay to write note on patient's behalf to return to today

## 2017-07-22 NOTE — Telephone Encounter (Signed)
Please advise 

## 2017-07-22 NOTE — Telephone Encounter (Signed)
Patient requests a doctors note stating why she missed work Friday and Monday (07/22/17) and why she is able to come back to work on Tuesday (07/23/17).

## 2017-09-03 ENCOUNTER — Encounter: Payer: Self-pay | Admitting: Internal Medicine

## 2017-09-03 ENCOUNTER — Ambulatory Visit (INDEPENDENT_AMBULATORY_CARE_PROVIDER_SITE_OTHER): Payer: BLUE CROSS/BLUE SHIELD | Admitting: Internal Medicine

## 2017-09-03 VITALS — BP 108/74 | HR 92 | Temp 97.6°F | Wt 140.0 lb

## 2017-09-03 DIAGNOSIS — R1011 Right upper quadrant pain: Secondary | ICD-10-CM

## 2017-09-03 DIAGNOSIS — J0100 Acute maxillary sinusitis, unspecified: Secondary | ICD-10-CM | POA: Diagnosis not present

## 2017-09-03 DIAGNOSIS — B182 Chronic viral hepatitis C: Secondary | ICD-10-CM

## 2017-09-03 DIAGNOSIS — F17200 Nicotine dependence, unspecified, uncomplicated: Secondary | ICD-10-CM

## 2017-09-03 MED ORDER — DOXYCYCLINE HYCLATE 100 MG PO TABS: 100.0000 mg | ORAL_TABLET | Freq: Two times a day (BID) | ORAL | 0 refills | Status: DC

## 2017-09-03 NOTE — Patient Instructions (Addendum)
Infectious disease/chronic hepatitis clinic referral  Abdominal ultrasound as discussed  How is this treated? Treatment for sinusitis depends on the cause and whether your condition is chronic or acute. If a virus is causing your sinusitis, your symptoms will go away on their own within 10 days. You may be given medicines to relieve your symptoms, including:  Topical nasal decongestants. They shrink swollen nasal passages and let mucus drain from your sinuses.  Antihistamines. These drugs block inflammation that is triggered by allergies. This can help to ease swelling in your nose and sinuses.  Topical nasal corticosteroids. These are nasal sprays that ease inflammation and swelling in your nose and sinuses.  Nasal saline washes. These rinses can help to get rid of thick mucus in your nose.  If your condition is caused by bacteria, you will be given an antibiotic medicine. If your condition is caused by a fungus, you will be given an antifungal medicine. Surgery may be needed to correct underlying conditions, such as narrow nasal passages. Surgery may also be needed to remove polyps. Follow these instructions at home: Medicines  Take, use, or apply over-the-counter and prescription medicines only as told by your health care provider. These may include nasal sprays.  If you were prescribed an antibiotic medicine, take it as told by your health care provider. Do not stop taking the antibiotic even if you start to feel better. Hydrate and Humidify  Drink enough water to keep your urine clear or pale yellow. Staying hydrated will help to thin your mucus.  Use a cool mist humidifier to keep the humidity level in your home above 50%.  Inhale steam for 10-15 minutes, 3-4 times a day or as told by your health care provider. You can do this in the bathroom while a hot shower is running.  Limit your exposure to cool or dry air. Rest  Rest as much as possible.

## 2017-09-03 NOTE — Progress Notes (Signed)
Subjective:    Patient ID: [name], female    DOB: 11-24-1955, 61 y.o.   MRN: [id number]  HPI 61 year old patient who is seen today with a number of concerns. Her chief complaint is right upper quadrant pain.  She has a 9-year history of chronic hepatitis C and was evaluated with a liver biopsy at that time.  She was felt not to be a candidate for antiviral therapy.  She was seen earlier in the year and was referred to the chronic hepatitis C clinic but apparently this appointment has not been made.  She states that she has had intermittent right upper quadrant pain over the years that has intensified over the past 3-4 months.  She has had no history of elevated liver function studies. Her second concern is a persistent sinus infection.  She has been seen at the urgent care and was initially treated with penicillin derivative for sinusitis.  But this was discontinued after a few days due to GI side effects and was treated with a azithromycin.  She has improved but still having significant sinus congestion and some purulent drainage.  No fever She continues to smoke but has decreased her tobacco consumption to less than 1/2 pack/day.  She does not smoke during the day but only when she returns home from work.  Past Medical History:  Diagnosis Date  . 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 Bronchitis   . Hepatitis C      Social History   Socioeconomic History  . Marital status: Married    Spouse name: Not on file  . Number of children: Not on file  . Years of education: Not on file  . Highest education level: Not on file  Social Needs  . Financial resource strain: Not on file  . Food insecurity - worry: Not on file  . Food insecurity - inability: Not on file  . Transportation needs - medical: Not on file  . Transportation needs - non-medical: Not on file  Occupational History  . Not on file  Tobacco Use  . Smoking status: Current Every Day Smoker    Packs/day: 0.75    Types:  Cigarettes  . Smokeless tobacco: Never Used  Substance and Sexual Activity  . Alcohol use: Yes    Alcohol/week: 0.6 oz    Types: 1 Shots of liquor per week    Comment: Occasional.  . Drug use: No  . Sexual activity: Not on file  Other Topics Concern  . Not on file  Social History Narrative  . Not on file    Past Surgical History:  Procedure Laterality Date  . ABDOMINAL HYSTERECTOMY  1986    Family History  Problem Relation Age of Onset  . Asthma Mother   . Alzheimer's disease Mother   . Hypertension Mother   . Cancer Maternal Aunt   . Cancer Paternal Aunt        Breast    Allergies  Allergen Reactions  . Aspirin     REACTION: unspecified  . Glycopyrrolate     REACTION: unspecified, pt unsure if she is allergic.  . Lactose Intolerance (Gi) Other (See Comments)    Milk only.Patient states that she can eat cheese.  Marland Kitchen Morphine Sulfate     REACTION: unspecified  . Penicillins Other (See Comments)     Heartburn  . Sulfa Antibiotics Tinitus    Current Outpatient Medications on File Prior to Visit  Medication Sig Dispense Refill  . albuterol (PROVENTIL HFA;VENTOLIN HFA)  108 (90 Base) MCG/ACT inhaler Inhale 2 puffs into the lungs every 6 (six) hours as needed for shortness of breath. 18 g 4  . ibuprofen (ADVIL,MOTRIN) 200 MG tablet Take 200 mg by mouth every 6 (six) hours as needed for mild pain or cramping.    Marland Kitchen PARoxetine (PAXIL) 20 MG tablet TAKE 1 TABLET BY MOUTH ONCE DAILY 60 tablet 0  . propranolol ER (INDERAL LA) 60 MG 24 hr capsule TAKE ONE CAPSULE BY MOUTH EVERY DAY. PLEASE SCHEDULE APPOINTMENT FOR ANNUAL EXAM BEFORE MORE REFILLS. 90 capsule 4  . SUMAtriptan (IMITREX) 50 MG tablet TAKE 1 TABLET BY MOUTH AS NEEDED FOR MIGRAINE. MAY REPEAT ONCE IN 2 HOURS IF HEADACHE PERSISTS OR RECURS 10 tablet 4  . XIIDRA 5 % SOLN Place 1 drop into both eyes 2 (two) times daily.  0   No current facility-administered medications on file prior to visit.     BP 108/74 (BP  Location: Left Arm, Patient Position: Sitting, Cuff Size: Normal)   Pulse 92   Temp 97.6 F (36.4 C)   Wt 140 lb (63.5 kg)   SpO2 94%   BMI 23.30 kg/m     Review of Systems  Constitutional: Positive for activity change and appetite change.  HENT: Positive for congestion, postnasal drip, sinus pressure and sinus pain. Negative for dental problem, hearing loss, rhinorrhea, sore throat and tinnitus.   Eyes: Negative for pain, discharge and visual disturbance.  Respiratory: Negative for cough and shortness of breath.   Cardiovascular: Negative for chest pain, palpitations and leg swelling.  Gastrointestinal: Positive for abdominal pain. Negative for abdominal distention, blood in stool, constipation, diarrhea, nausea and vomiting.  Genitourinary: Negative for difficulty urinating, dysuria, flank pain, frequency, hematuria, pelvic pain, urgency, vaginal bleeding, vaginal discharge and vaginal pain.  Musculoskeletal: Negative for arthralgias, gait problem and joint swelling.  Skin: Negative for rash.  Neurological: Negative for dizziness, syncope, speech difficulty, weakness, numbness and headaches.  Hematological: Negative for adenopathy.  Psychiatric/Behavioral: Negative for agitation, behavioral problems and dysphoric mood. The patient is not nervous/anxious.        Objective:   Physical Exam  Constitutional: She is oriented to person, place, and time. She appears well-developed and well-nourished.  HENT:  Head: Normocephalic.  Right Ear: External ear normal.  Left Ear: External ear normal.  Mouth/Throat: Oropharynx is clear and moist.  Bilateral mild maxillary sinus tenderness  Eyes: Conjunctivae and EOM are normal. Pupils are equal, round, and reactive to light.  Neck: Normal range of motion. Neck supple. No thyromegaly present.  Cardiovascular: Normal rate, regular rhythm, normal heart sounds and intact distal pulses.  Pulmonary/Chest: Effort normal and breath sounds normal. No  respiratory distress. She has no wheezes.  Abdominal: Soft. Bowel sounds are normal. She exhibits no mass. There is no tenderness.  Very mild right upper quadrant tenderness No definite hepatomegaly  Musculoskeletal: Normal range of motion.  Lymphadenopathy:    She has no cervical adenopathy.  Neurological: She is alert and oriented to person, place, and time.  Skin: Skin is warm and dry. No rash noted.  Psychiatric: She has a normal mood and affect. Her behavior is normal.          Assessment & Plan:   Chronic hepatitis C.  Will refer to chronic hepatitis C/infectious disease clinic.  Will check LFTs abdominal ultrasound, viral load and alpha-fetoprotein Subacute sinusitis.  Will treat with doxycycline History of tobacco use improved  Right upper quadrant pain.  Review abdominal ultrasound and  LFTs  Nyoka Cowden

## 2017-09-04 LAB — COMPREHENSIVE METABOLIC PANEL
ALT: 30 U/L (ref 0–35)
AST: 24 U/L (ref 0–37)
Albumin: 4.1 g/dL (ref 3.5–5.2)
Alkaline Phosphatase: 84 U/L (ref 39–117)
BUN: 11 mg/dL (ref 6–23)
CO2: 29 mEq/L (ref 19–32)
Calcium: 8.9 mg/dL (ref 8.4–10.5)
Chloride: 103 mEq/L (ref 96–112)
Creatinine, Ser: 0.67 mg/dL (ref 0.40–1.20)
GFR: 94.79 mL/min (ref >60.00)
Glucose, Bld: 74 mg/dL (ref 70–99)
Potassium: 4.1 mEq/L (ref 3.5–5.1)
Sodium: 141 mEq/L (ref 135–145)
Total Bilirubin: 0.6 mg/dL (ref 0.2–1.2)
Total Protein: 6.6 g/dL (ref 6.0–8.3)

## 2017-09-04 LAB — CBC WITH DIFFERENTIAL/PLATELET
Basophils Absolute: 0 10*3/uL (ref 0.0–0.1)
Basophils Relative: 0.4 % (ref 0.0–3.0)
Eosinophils Absolute: 0.1 10*3/uL (ref 0.0–0.7)
Eosinophils Relative: 2.6 % (ref 0.0–5.0)
HCT: 40.2 % (ref 36.0–46.0)
Hemoglobin: 13.7 g/dL (ref 12.0–15.0)
Lymphocytes Relative: 42.3 % (ref 12.0–46.0)
Lymphs Abs: 2.2 10*3/uL (ref 0.7–4.0)
MCHC: 34 g/dL (ref 30.0–36.0)
MCV: 90.5 fl (ref 78.0–100.0)
Monocytes Absolute: 0.7 10*3/uL (ref 0.1–1.0)
Monocytes Relative: 12.9 % — ABNORMAL HIGH (ref 3.0–12.0)
Neutro Abs: 2.2 10*3/uL (ref 1.4–7.7)
Neutrophils Relative %: 41.8 % — ABNORMAL LOW (ref 43.0–77.0)
Platelets: 234 10*3/uL (ref 150.0–400.0)
RBC: 4.44 Mil/uL (ref 3.87–5.11)
RDW: 12.7 % (ref 11.5–15.5)
WBC: 5.2 10*3/uL (ref 4.0–10.5)

## 2017-09-05 ENCOUNTER — Encounter: Payer: Self-pay | Admitting: Internal Medicine

## 2017-09-05 ENCOUNTER — Telehealth: Payer: Self-pay | Admitting: Family Medicine

## 2017-09-05 LAB — HEPATITIS C RNA QUANTITATIVE
HCV Quantitative Log: 5.45 Log IU/mL — ABNORMAL HIGH
HCV RNA, PCR, QN: 283000 IU/mL — ABNORMAL HIGH

## 2017-09-05 LAB — AFP TUMOR MARKER: AFP-Tumor Marker: 4.4 ng/mL

## 2017-09-05 NOTE — Telephone Encounter (Signed)
Dr. Henreitta Leber office called to report that they were unable to reach pt by phone to schedule a office visit.

## 2017-09-05 NOTE — Telephone Encounter (Signed)
I was unable to contact pt via phone. A letter was mailed today to patients home address stating to call and make an appointment with Dr Linus Salmons.

## 2017-09-11 ENCOUNTER — Other Ambulatory Visit: Payer: Self-pay | Admitting: Internal Medicine

## 2017-09-27 ENCOUNTER — Encounter: Payer: Self-pay | Admitting: Internal Medicine

## 2017-10-03 ENCOUNTER — Other Ambulatory Visit: Payer: Self-pay | Admitting: Internal Medicine

## 2017-10-03 DIAGNOSIS — B182 Chronic viral hepatitis C: Secondary | ICD-10-CM

## 2017-10-09 ENCOUNTER — Ambulatory Visit: Payer: Self-pay | Admitting: Family Medicine

## 2017-10-09 ENCOUNTER — Encounter: Payer: Self-pay | Admitting: *Deleted

## 2017-10-09 ENCOUNTER — Encounter: Payer: Self-pay | Admitting: Family Medicine

## 2017-10-09 ENCOUNTER — Ambulatory Visit (INDEPENDENT_AMBULATORY_CARE_PROVIDER_SITE_OTHER): Payer: Self-pay | Admitting: Family Medicine

## 2017-10-09 VITALS — BP 122/88 | HR 78 | Temp 98.5°F | Resp 16 | Ht 65.0 in | Wt 144.0 lb

## 2017-10-09 DIAGNOSIS — R053 Chronic cough: Secondary | ICD-10-CM

## 2017-10-09 DIAGNOSIS — R0989 Other specified symptoms and signs involving the circulatory and respiratory systems: Secondary | ICD-10-CM

## 2017-10-09 DIAGNOSIS — K219 Gastro-esophageal reflux disease without esophagitis: Secondary | ICD-10-CM

## 2017-10-09 DIAGNOSIS — R05 Cough: Secondary | ICD-10-CM

## 2017-10-09 DIAGNOSIS — J989 Respiratory disorder, unspecified: Secondary | ICD-10-CM

## 2017-10-09 DIAGNOSIS — F17200 Nicotine dependence, unspecified, uncomplicated: Secondary | ICD-10-CM

## 2017-10-09 DIAGNOSIS — R0981 Nasal congestion: Secondary | ICD-10-CM

## 2017-10-09 DIAGNOSIS — Z0289 Encounter for other administrative examinations: Secondary | ICD-10-CM

## 2017-10-09 MED ORDER — BUDESONIDE-FORMOTEROL FUMARATE 160-4.5 MCG/ACT IN AERO: 2.0000 | INHALATION_SPRAY | Freq: Two times a day (BID) | RESPIRATORY_TRACT | 3 refills | Status: DC

## 2017-10-09 MED ORDER — PANTOPRAZOLE SODIUM 20 MG PO TBEC: 20.0000 mg | DELAYED_RELEASE_TABLET | Freq: Every day | ORAL | 0 refills | Status: DC

## 2017-10-09 MED ORDER — FLUTICASONE PROPIONATE 50 MCG/ACT NA SUSP: 1.0000 | Freq: Two times a day (BID) | NASAL | 0 refills | Status: DC

## 2017-10-09 MED ORDER — PREDNISONE 20 MG PO TABS: ORAL_TABLET | ORAL | 0 refills | Status: AC

## 2017-10-09 NOTE — Progress Notes (Signed)
ACUTE VISIT  HPI:  Chief Complaint  Patient presents with  . Cough    green mucus, never gotten any better since visit in November  . Headache  . sinus pressure    [name] is a 62 y.o.female here today complaining of respiratory symptoms since August 05, 2017.Marland Kitchen  She completed oral antibiotics, Doxycycline, prescribed on December 24/2018.  She also was treated with Augmentin, Prednisone, and Azithromycin. She continues having productive cough with greenish sputum, she denies hemoptysis. Frontal sinus pressure, greenish rhinorrhea, and postnasal drainage. 5-7 days of dysphonia.  Cough  This is a recurrent problem. The current episode started more than 1 month ago. The problem has been waxing and waning. The cough is productive of sputum. Associated symptoms include headaches, nasal congestion, postnasal drip and rhinorrhea. Pertinent negatives include no chest pain, chills, ear congestion, ear pain, eye redness, fever, heartburn, hemoptysis, myalgias, rash, sore throat, shortness of breath or wheezing. The symptoms are aggravated by exercise. Risk factors for lung disease include smoking/tobacco exposure. She has tried steroid inhaler for the symptoms. The treatment provided mild relief. Her past medical history is significant for COPD (? ) and environmental allergies.   She is on Symbicort 80-4.5 mcg twice daily. She also has Albuterol at home but she has not used it, states that she was not sure if she could use it with the Symbicort.  She reports having shortness of breath, with mild exertion (walking) and up on coughing spells. Intermittent wheezing.  No Hx of recent travel. No sick contact. No known insect bite.  Hx of allergies: Worse during childhood. + Smoker.  Symptoms otherwise stable.  She has history of GERD, she is no longer on PPI, she has not noted any heartburn.   Review of Systems  Constitutional: Positive for activity change and fatigue.  Negative for appetite change, chills and fever.  HENT: Positive for congestion, postnasal drip, rhinorrhea and sinus pressure. Negative for ear pain, facial swelling, mouth sores, sore throat, trouble swallowing and voice change.   Eyes: Negative for discharge, redness, itching and visual disturbance.  Respiratory: Positive for cough. Negative for hemoptysis, shortness of breath and wheezing.   Cardiovascular: Negative for chest pain and leg swelling.  Gastrointestinal: Negative for abdominal pain, diarrhea, heartburn, nausea and vomiting.  Musculoskeletal: Negative for gait problem, myalgias and neck pain.  Skin: Negative for pallor and rash.  Allergic/Immunologic: Positive for environmental allergies.  Neurological: Positive for headaches. Negative for syncope and weakness.  Hematological: Negative for adenopathy. Does not bruise/bleed easily.  Psychiatric/Behavioral: Positive for sleep disturbance. Negative for confusion. The patient is nervous/anxious.       Current Outpatient Medications on File Prior to Visit  Medication Sig Dispense Refill  . albuterol (PROVENTIL HFA;VENTOLIN HFA) 108 (90 Base) MCG/ACT inhaler Inhale 2 puffs into the lungs every 6 (six) hours as needed for shortness of breath. 18 g 4  . ibuprofen (ADVIL,MOTRIN) 200 MG tablet Take 200 mg by mouth every 6 (six) hours as needed for mild pain or cramping.    Marland Kitchen PARoxetine (PAXIL) 20 MG tablet TAKE 1 TABLET BY MOUTH ONCE DAILY 60 tablet 0  . propranolol ER (INDERAL LA) 60 MG 24 hr capsule TAKE ONE CAPSULE BY MOUTH EVERY DAY. PLEASE SCHEDULE APPOINTMENT FOR ANNUAL EXAM BEFORE MORE REFILLS. 90 capsule 4  . SUMAtriptan (IMITREX) 50 MG tablet TAKE 1 TABLET BY MOUTH AS NEEDED FOR MIGRAINE. MAY REPEAT ONCE IN 2 HOURS IF HEADACHE PERSISTS OR RECURS 10  tablet 4  . XIIDRA 5 % SOLN Place 1 drop into both eyes 2 (two) times daily.  0   No current facility-administered medications on file prior to visit.      Past Medical History:    Diagnosis Date  . 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 Bronchitis   . Hepatitis C    Allergies  Allergen Reactions  . Aspirin     REACTION: unspecified  . Glycopyrrolate     REACTION: unspecified, pt unsure if she is allergic.  . Lactose Intolerance (Gi) Other (See Comments)    Milk only.Patient states that she can eat cheese.  Marland Kitchen Morphine Sulfate     REACTION: unspecified  . Penicillins Other (See Comments)     Heartburn  . Sulfa Antibiotics Tinitus    Social History   Socioeconomic History  . Marital status: Married    Spouse name: None  . Number of children: None  . Years of education: None  . Highest education level: None  Social Needs  . Financial resource strain: None  . Food insecurity - worry: None  . Food insecurity - inability: None  . Transportation needs - medical: None  . Transportation needs - non-medical: None  Occupational History  . None  Tobacco Use  . Smoking status: Current Every Day Smoker    Packs/day: 0.75    Types: Cigarettes  . Smokeless tobacco: Never Used  Substance and Sexual Activity  . Alcohol use: Yes    Alcohol/week: 0.6 oz    Types: 1 Shots of liquor per week    Comment: Occasional.  . Drug use: No  . Sexual activity: None  Other Topics Concern  . None  Social History Narrative  . None    Vitals:   10/09/17 0956 10/09/17 1125  BP: 122/88   Pulse: 78   Resp: 20 16  Temp: 98.5 F (36.9 C)   SpO2: 96%    Body mass index is 23.96 kg/m.   Physical Exam  Nursing note and vitals reviewed. Constitutional: She is oriented to person, place, and time. She appears well-developed and well-nourished. She does not appear ill. No distress.  HENT:  Head: Normocephalic and atraumatic.  Right Ear: Tympanic membrane, external ear and ear canal normal.  Left Ear: Tympanic membrane, external ear and ear canal normal.  Nose: Rhinorrhea present. Right sinus exhibits maxillary sinus tenderness (mild). Right sinus exhibits no frontal sinus  tenderness. Left sinus exhibits no maxillary sinus tenderness and no frontal sinus tenderness.  Mouth/Throat: Oropharynx is clear and moist and mucous membranes are normal.  Postnasal drainage. Nasal voice. Moderate dysphonia.  Eyes: Conjunctivae are normal.  Neck: No muscular tenderness present. No edema and no erythema present.  Cardiovascular: Normal rate and regular rhythm.  No murmur heard. Respiratory: Effort normal. No stridor. No respiratory distress. She has wheezes. She has no rhonchi. She has no rales.  Musculoskeletal: She exhibits no edema.  Lymphadenopathy:       Head (right side): No submandibular adenopathy present.       Head (left side): No submandibular adenopathy present.    She has no cervical adenopathy.  Neurological: She is alert and oriented to person, place, and time. She has normal strength. Gait normal.  Skin: Skin is warm. No rash noted. No erythema.  Psychiatric: Her mood appears anxious.  Well groomed, good eye contact.    ASSESSMENT AND PLAN:   [name] was seen today for cough, headache and sinus pressure.  Diagnoses and all  orders for this visit:  Reactive airway disease that is not asthma  ? COPD. Wheezing resolved after breathing treatment with DuoNeb, no rales appreciated. Symbicort increased from 80 to 160-4.5 mcg to continue twice daily. Albuterol inh 2 puff every 6 hours for a week then as needed for wheezing or shortness of breath.  Some side effects of Prednisone discussed, recommend taking it with breakfast. Instructed about warning signs.   Follow-up with PCP in 10 days, before if needed.       -     predniSONE (DELTASONE) 20 MG tablet; 3 tabs for 3 days, 2 tabs for 3 days, 1 tabs for 3 days, and 1/2 tab for 3 days. Take tables together with breakfast. -     budesonide-formoterol (SYMBICORT) 160-4.5 MCG/ACT inhaler; Inhale 2 puffs into the lungs 2 (two) times daily. -     DG Chest 2 View; Future  Gastroesophageal reflux  disease, esophagitis presence not specified  This could be aggravating persistent cough and could also contribute to dysphonia. I recommend starting Protonix 20 mg daily. GERD precautions discussed. Follow-up with PCP as needed.  -     pantoprazole (PROTONIX) 20 MG tablet; Take 1 tablet (20 mg total) by mouth daily.  Persistent cough  Possible etiologies discussed: Allergies, residual symptoms after URI, tobacco use, COPD/asthma among some. Further recommendations will be given according to CXR report.  -     DG Chest 2 View; Future  Nasal sinus congestion  She has completed 3 courses of antibiotic treatment. Recommend intranasal steroid, saline irrigations as needed, steam inhalation. She may need a sinus CT scan if symptoms are persistent after treatment recommendations.  -     fluticasone (FLONASE) 50 MCG/ACT nasal spray; Place 1 spray into both nostrils 2 (two) times daily.  Tobacco use disorder  Appears effects of smoking discussed, strongly recommend smoking cessation.    -[name] was advised to seek attention immediately if symptoms worsen.     Betty G. Martinique, MD  [facility]. [place] office.

## 2017-10-09 NOTE — Patient Instructions (Addendum)
  [name] I have seen you today for an acute visit.  A few things to remember from today's visit:   Gastroesophageal reflux disease, esophagitis presence not specified - Plan: pantoprazole (PROTONIX) 20 MG tablet  Persistent cough - Plan: predniSONE (DELTASONE) 20 MG tablet, DG Chest 2 View  Nasal sinus congestion - Plan: fluticasone (FLONASE) 50 MCG/ACT nasal spray  Tobacco use disorder  Reactive airway disease that is not asthma - Plan: budesonide-formoterol (SYMBICORT) 160-4.5 MCG/ACT inhaler, DG Chest 2 View   Medications prescribed today are intended for short period of time and will not be refill upon request, a follow up appointment might be necessary to discuss continuation of of treatment if appropriate.  Albuterol inh 2 puff every 6 hours for a week then as needed for wheezing or shortness of breath.  Symbicort increased to 160-4.5 mcg, continue using twice daily. Prednisone started, taken with food ideally in the morning with breakfast. Nasal irrigations with saline as needed.  Smoking cessation is strongly recommended.   Cough can also be aggravated by acid reflux, so I sent Protonix to take in the morning before breakfast.    In general please monitor for signs of worsening symptoms and seek immediate medical attention if any concerning.  If symptoms are not resolved in a few days/weeks you should schedule a follow up appointment with your doctor, before if needed.  I hope you get better soon!

## 2017-10-10 ENCOUNTER — Ambulatory Visit (INDEPENDENT_AMBULATORY_CARE_PROVIDER_SITE_OTHER): Payer: Self-pay | Admitting: Internal Medicine

## 2017-10-10 ENCOUNTER — Encounter: Payer: Self-pay | Admitting: Internal Medicine

## 2017-10-10 VITALS — BP 133/81 | HR 89 | Temp 98.0°F | Ht 65.0 in | Wt 145.0 lb

## 2017-10-10 DIAGNOSIS — B182 Chronic viral hepatitis C: Secondary | ICD-10-CM

## 2017-10-10 NOTE — Progress Notes (Signed)
[facility] for Infectious Disease   CC: consideration for treatment for chronic hepatitis C  HPI:  +[name] is a 62 y.o. female who presents for initial evaluation and management of chronic hepatitis C.  Patient tested positive over 10 years ago. 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 Patient denies history of blood transfusion, intranasal drug use. Patient has had other studies performed. Results: hepatitis C RNA by PCR, result: positive. Patient has not had prior treatment for Hepatitis C. Patient does not have a past history of liver disease. Patient does not have a family history of liver disease. Patient does not  have associated signs or symptoms related to liver disease.  Labs reviewed and confirm chronic hepatitis C with a positive viral load.  Remote ultrasound with no cirrhosis noted in Epic.  She was previously evaluated by Dr. Maceo Pro and had a liver biopsy at the time but minimal inflammation and fibrosis so no treatment offered.     Patient does not have documented immunity to Hepatitis A. Patient does not have documented immunity to Hepatitis B.  Though she remembers getting the vaccines.   Review of Systems:  Constitutional: negative for fatigue and malaise Gastrointestinal: negative for diarrhea Integument/breast: negative for rash All other systems reviewed and are negative       Past Medical History:  Diagnosis Date  . 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 Bronchitis   . Hepatitis C     Prior to Admission medications   Medication Sig Start Date End Date Taking? Authorizing Provider  albuterol (PROVENTIL HFA;VENTOLIN HFA) 108 (90 Base) MCG/ACT inhaler Inhale 2 puffs into the lungs every 6 (six) hours as needed for shortness of breath. 10/19/16  Yes Marletta Lor, MD  budesonide-formoterol [facility]) 160-4.5 MCG/ACT inhaler Inhale 2 puffs into the lungs 2 (two) times daily. 10/09/17  Yes Martinique, Betty G, MD   fluticasone [facility]) 50 MCG/ACT nasal spray Place 1 spray into both nostrils 2 (two) times daily. 10/09/17 11/08/17 Yes Martinique, Betty G, MD  ibuprofen (ADVIL,MOTRIN) 200 MG tablet Take 200 mg by mouth every 6 (six) hours as needed for mild pain or cramping.   Yes [provider]  pantoprazole (PROTONIX) 20 MG tablet Take 1 tablet (20 mg total) by mouth daily. 10/09/17  Yes Martinique, Betty G, MD  PARoxetine (PAXIL) 20 MG tablet TAKE 1 TABLET BY MOUTH ONCE DAILY 09/12/17  Yes Marletta Lor, MD  predniSONE (DELTASONE) 20 MG tablet 3 tabs for 3 days, 2 tabs for 3 days, 1 tabs for 3 days, and 1/2 tab for 3 days. Take tables together with breakfast. 10/09/17 10/17/17 Yes Martinique, Betty G, MD  propranolol ER (INDERAL LA) 60 MG 24 hr capsule TAKE ONE CAPSULE BY MOUTH EVERY DAY. PLEASE SCHEDULE APPOINTMENT FOR ANNUAL EXAM BEFORE MORE REFILLS. 10/19/16  Yes Marletta Lor, MD  SUMAtriptan (IMITREX) 50 MG tablet TAKE 1 TABLET BY MOUTH AS NEEDED FOR MIGRAINE. MAY REPEAT ONCE IN 2 HOURS IF HEADACHE PERSISTS OR RECURS 10/19/16  Yes Marletta Lor, MD  XIIDRA 5 % SOLN Place 1 drop into both eyes 2 (two) times daily. 07/10/17  Yes [provider]    Allergies  Allergen Reactions  . Aspirin     REACTION: unspecified  . Glycopyrrolate     REACTION: unspecified, pt unsure if she is allergic.  . Lactose Intolerance (Gi) Other (See Comments)    Milk only.Patient states that she can eat cheese.  Marland Kitchen  Morphine Sulfate     REACTION: unspecified  . Penicillins Other (See Comments)     Heartburn  . Sulfa Antibiotics Tinitus    Social History   Tobacco Use  . Smoking status: Current Every Day Smoker    Packs/day: 0.30    Types: Cigarettes  . Smokeless tobacco: Never Used  Substance Use Topics  . Alcohol use: Yes    Alcohol/week: 0.6 oz    Types: 1 Shots of liquor per week    Comment: Occasional.  . Drug use: Yes    Types: Marijuana    Family History  Problem Relation Age of  Onset  . Asthma Mother   . Alzheimer's disease Mother   . Hypertension Mother   . Cancer Maternal Aunt   . Cancer Paternal Aunt        Breast      Objective:  Constitutional: in no apparent distress and alert,  Vitals:   10/10/17 1556  BP: 133/81  Pulse: 89  Temp: 98 F (36.7 C)  SpO2: 97%   Eyes: anicteric Cardiovascular: Cor RRR Respiratory: CTA B; normal respiratory effort Gastrointestinal: Bowel sounds are normal, liver is not enlarged, spleen is not enlarged Musculoskeletal: no pedal edema noted Skin: negatives: no rash; no porphyria cutanea tarda Lymphatic: no cervical lymphadenopathy   Laboratory Genotype: No results found for: HCVGENOTYPE HCV viral load: No results found for: HCVQUANT Lab Results  Component Value Date   WBC 5.2 09/03/2017   HGB 13.7 09/03/2017   HCT 40.2 09/03/2017   MCV 90.5 09/03/2017   PLT 234.0 09/03/2017    Lab Results  Component Value Date   CREATININE 0.67 09/03/2017   BUN 11 09/03/2017   NA 141 09/03/2017   K 4.1 09/03/2017   CL 103 09/03/2017   CO2 29 09/03/2017    Lab Results  Component Value Date   ALT 30 09/03/2017   AST 24 09/03/2017   ALKPHOS 84 09/03/2017     Labs and history reviewed and show CHILD-PUGH unknown  5-6 points: Child class A 7-9 points: Child class B 10-15 points: Child class C  Lab Results  Component Value Date   INR 0.8 10/10/2007   BILITOT 0.6 09/03/2017   ALBUMIN 4.1 09/03/2017     Assessment: New Patient with Chronic Hepatitis C genotype unknown, untreated.  I discussed with the patient the lab findings that confirm chronic hepatitis C as well as the natural history and progression of disease including about 30% of people who develop cirrhosis of the liver if left untreated and once cirrhosis is established there is a 2-7% risk per year of liver cancer and liver failure.  I discussed the importance of treatment and benefits in reducing the risk, even if significant liver fibrosis exists.     Plan: 1) Patient counseled extensively on limiting acetaminophen to no more than 2 grams daily, avoidance of alcohol. 2) Transmission discussed with patient including sexual transmission, sharing razors and toothbrush.   3) Will need referral to gastroenterology if concern for cirrhosis 4) Will need referral for substance abuse counseling: No.; Further work up to include urine drug screen  No. 5) Will prescribe appropriate medication based on genotype and coverage  6) Hepatitis A and B titers 7) Pneumovax vaccine next visit 9) Further work up to include liver staging with elastography 10) will follow up after tests to discuss results and decide on appropriate medication  She has an ultrasound scheduled for tomorrow but no acute symptoms so will defer this and  do ultrasound with elastography since those can't be separated to avoid duplication.

## 2017-10-11 ENCOUNTER — Other Ambulatory Visit: Payer: Self-pay

## 2017-10-11 LAB — HEPATITIS A ANTIBODY, TOTAL: Hepatitis A AB,Total: REACTIVE — AB

## 2017-10-11 LAB — TIQ-NTM

## 2017-10-11 LAB — HEPATITIS B SURFACE ANTIGEN: Hepatitis B Surface Ag: NONREACTIVE

## 2017-10-11 LAB — PROTIME-INR
INR: 0.9
Prothrombin Time: 9.4 s (ref 9.0–11.5)

## 2017-10-11 LAB — HEPATITIS B SURFACE ANTIBODY,QUALITATIVE: Hep B S Ab: REACTIVE — AB

## 2017-10-11 LAB — HEPATITIS B CORE ANTIBODY, TOTAL: Hep B Core Total Ab: REACTIVE — AB

## 2017-10-14 LAB — HEPATITIS C GENOTYPE: HCV Genotype: 4

## 2017-10-18 ENCOUNTER — Other Ambulatory Visit: Payer: Self-pay | Admitting: Internal Medicine

## 2017-10-18 LAB — LIVER FIBROSIS, FIBROTEST-ACTITEST
ALT: 20 U/L (ref 6–29)
Alpha-2-Macroglobulin: 204 mg/dL (ref 106–279)
Apolipoprotein A1: 171 mg/dL (ref 101–198)
Bilirubin: 0.2 mg/dL (ref 0.2–1.2)
Fibrosis Score: 0.05
GGT: 16 U/L (ref 3–65)
Haptoglobin: 274 mg/dL — ABNORMAL HIGH (ref 43–212)
Necroinflammat ACT Score: 0.06
Reference ID: 2289412

## 2017-10-21 ENCOUNTER — Telehealth: Payer: Self-pay | Admitting: *Deleted

## 2017-10-21 ENCOUNTER — Other Ambulatory Visit: Payer: Self-pay | Admitting: Internal Medicine

## 2017-10-21 DIAGNOSIS — B182 Chronic viral hepatitis C: Secondary | ICD-10-CM

## 2017-10-21 NOTE — Telephone Encounter (Signed)
New orders written, as radiology orders have changed since previously ordered. Caryl Pina to follow up with scheduling and patient follow up. Landis Gandy, RN

## 2017-10-21 NOTE — Telephone Encounter (Signed)
-----   Message from Thayer Headings, MD sent at 10/21/2017  2:22 PM EST ----- Do you know why she has not yet had an ultrasound with elastography or a follow up appt with me? thanks

## 2017-10-23 ENCOUNTER — Telehealth: Payer: Self-pay | Admitting: Internal Medicine

## 2017-10-23 DIAGNOSIS — J329 Chronic sinusitis, unspecified: Secondary | ICD-10-CM

## 2017-10-23 NOTE — Telephone Encounter (Signed)
CRM for notification. See Telephone encounter for:   10/23/17.    Relation to pt: self Call back number:[phone]   Reason for call:  Patient requesting referral to Dr. Leonides Sake. [name], MD  [address], [place], [place] [address],  [phone] for sinus infection, patient has a scheduled appointment for 10/28/17 at 2:30, please

## 2017-10-23 NOTE — Telephone Encounter (Signed)
Referral placed.

## 2017-10-23 NOTE — Telephone Encounter (Signed)
Okay for referral?

## 2017-10-25 ENCOUNTER — Ambulatory Visit (HOSPITAL_COMMUNITY)
Admission: RE | Admit: 2017-10-25 | Discharge: 2017-10-25 | Disposition: A | Discharge status: Home / Self Care | Payer: BLUE CROSS/BLUE SHIELD | Source: Ambulatory Visit | Attending: Internal Medicine | Admitting: Internal Medicine

## 2017-10-25 DIAGNOSIS — B182 Chronic viral hepatitis C: Secondary | ICD-10-CM | POA: Diagnosis present

## 2017-10-25 DIAGNOSIS — K7689 Other specified diseases of liver: Secondary | ICD-10-CM | POA: Insufficient documentation

## 2017-11-13 ENCOUNTER — Ambulatory Visit (INDEPENDENT_AMBULATORY_CARE_PROVIDER_SITE_OTHER): Payer: BLUE CROSS/BLUE SHIELD | Admitting: Internal Medicine

## 2017-11-13 ENCOUNTER — Encounter: Payer: Self-pay | Admitting: Internal Medicine

## 2017-11-13 VITALS — BP 161/77 | HR 85 | Temp 97.3°F | Ht 65.0 in | Wt 152.0 lb

## 2017-11-13 DIAGNOSIS — Z23 Encounter for immunization: Secondary | ICD-10-CM | POA: Diagnosis not present

## 2017-11-13 DIAGNOSIS — Z7185 Encounter for immunization safety counseling: Secondary | ICD-10-CM

## 2017-11-13 DIAGNOSIS — B182 Chronic viral hepatitis C: Secondary | ICD-10-CM | POA: Diagnosis not present

## 2017-11-13 DIAGNOSIS — Z7189 Other specified counseling: Secondary | ICD-10-CM

## 2017-11-13 DIAGNOSIS — F17200 Nicotine dependence, unspecified, uncomplicated: Secondary | ICD-10-CM | POA: Diagnosis not present

## 2017-11-13 DIAGNOSIS — K7460 Unspecified cirrhosis of liver: Secondary | ICD-10-CM

## 2017-11-13 HISTORY — DX: Unspecified cirrhosis of liver: K74.60

## 2017-11-13 HISTORY — DX: Encounter for immunization safety counseling: Z71.85

## 2017-11-13 MED ORDER — LEDIPASVIR-SOFOSBUVIR 90-400 MG PO TABS: 1.0000 | ORAL_TABLET | Freq: Every day | ORAL | 2 refills | Status: DC

## 2017-11-13 NOTE — Patient Instructions (Signed)
Date 11/13/17  Dear Ms [name], As discussed in the [facility], your hepatitis C therapy will include the following medications:          Harvoni 90mg /400mg  tablet:           Take 1 tablet by mouth once daily   Please note that ALL MEDICATIONS WILL START ON THE SAME DATE for a total of 12 weeks. ---------------------------------------------------------------- Your HCV Treatment Start Date: TBA   Your HCV genotype:  4    Liver Fibrosis: cirrhosis    ---------------------------------------------------------------- YOUR PHARMACY CONTACT:   [facility] [address] [place], [place] [address] Phone: [phone] Hours: Monday to Friday 7:30 am to 6:00 pm   Please always contact your pharmacy at least 3-4 business days before you run out of medications to ensure your next month's medication is ready or 1 week prior to running out if you receive it by mail.  Remember, each prescription is for 28 days. ---------------------------------------------------------------- GENERAL NOTES REGARDING YOUR HEPATITIS C MEDICATION:  SOFOSBUVIR/LEDIPASVIR (HARVONI): - Harvoni tablet is taken daily with OR without food. - The tablets are orange. - The tablets should be stored at room temperature.  - Acid reducing agents such as H2 blockers (ie. Pepcid (famotidine), Zantac (ranitidine), Tagamet (cimetidine), Axid (nizatidine) and proton pump inhibitors (ie. Prilosec (omeprazole), Protonix (pantoprazole), Nexium (esomeprazole), or Aciphex (rabeprazole)) can decrease effectiveness of Harvoni. Do not take until you have discussed with a health care provider.    -Antacids that contain magnesium and/or aluminum hydroxide (ie. Milk of Magensia, Rolaids, Gaviscon, Maalox, Mylanta, an dArthritis Pain Formula)can reduce absorption of Harvoni, so take them at least 4 hours before or after Harvoni.  -Calcium carbonate (calcium supplements or antacids such as Tums, Caltrate, Os-Cal)needs to be  taken at least 4 hours hours before or after Harvoni.  -St. John's wort or any products that contain St. John's wort like some herbal supplements  Please inform the office prior to starting any of these medications.  - The common side effects associated with Harvoni include:      1. Fatigue      2. Headache      3. Nausea      4. Diarrhea      5. Insomnia  Please note that this only lists the most common side effects and is NOT a comprehensive list of the potential side effects of these medications. For more information, please review the drug information sheets that come with your medication package from the pharmacy.  ---------------------------------------------------------------- GENERAL HELPFUL HINTS ON HCV THERAPY: 1. Stay well-hydrated. 2. Notify the ID Clinic of any changes in your other over-the-counter/herbal or prescription medications. 3. If you miss a dose of your medication, take the missed dose as soon as you remember. Return to your regular time/dose schedule the next day.  4.  Do not stop taking your medications without first talking with your healthcare provider. 5.  You may take Tylenol (acetaminophen), as long as the dose is less than 2000 mg (OR no more than 4 tablets of the Tylenol Extra Strengths 500mg  tablet) in 24 hours. 6.  You will see our pharmacist-specialist within the first 2 weeks of starting your medication to monitor for any possible side effects. 7.  You will have labs once during treatment, after soon after treatment completion and one final lab 6 months after treatment completion to verify the virus is out of your system.  Thayer Headings, [facility] [address] [place], [place]  [id number] [phone]

## 2017-11-13 NOTE — Progress Notes (Signed)
   Subjective:    Patient ID: [name], female    DOB: 08/13/1956, 62 y.o.   MRN: [id number]  HPI Here for follow up of HCV She has gentoype 4, viral load of 283,000 and F3/4 on Elastography with nodular contours of liver concerning for cirrhosis.  No fatigue.  Hepatitis A and B immune.     Review of Systems  Constitutional: Negative for fatigue.  Gastrointestinal: Negative for diarrhea.       Objective:   Physical Exam  Constitutional: She appears well-developed and well-nourished.  HENT:  Mouth/Throat: No oropharyngeal exudate.  Eyes: No scleral icterus.  Cardiovascular: Normal rate, regular rhythm and normal heart sounds.  No murmur heard. Pulmonary/Chest: Effort normal and breath sounds normal. No respiratory distress.  Skin: No rash noted.   SH: no alcohol; + tobacco       Assessment & Plan:

## 2017-11-13 NOTE — Assessment & Plan Note (Signed)
Counseled on cessation 

## 2017-11-13 NOTE — Assessment & Plan Note (Signed)
Pneumovax given.  Will refer to GI after treatment completion.  I will follow up with her at EOT or SVR 12.

## 2017-11-13 NOTE — Assessment & Plan Note (Signed)
Pneumovax offered and given today

## 2017-11-13 NOTE — Assessment & Plan Note (Signed)
Genotype 4.  Will prescribe harvoni for 12 weeks.

## 2017-11-18 ENCOUNTER — Other Ambulatory Visit: Payer: Self-pay | Admitting: Family Medicine

## 2017-11-18 ENCOUNTER — Other Ambulatory Visit: Payer: Self-pay | Admitting: Pharmacist Clinician (PhC)/ Clinical Pharmacy Specialist

## 2017-11-18 DIAGNOSIS — K219 Gastro-esophageal reflux disease without esophagitis: Secondary | ICD-10-CM

## 2017-11-18 MED ORDER — LEDIPASVIR-SOFOSBUVIR 90-400 MG PO TABS: 1.0000 | ORAL_TABLET | Freq: Every day | ORAL | 2 refills | Status: DC

## 2017-11-18 NOTE — Progress Notes (Signed)
Have to be transferred to a specialty pharmacy. Sent to [facility].

## 2017-11-19 ENCOUNTER — Encounter: Payer: Self-pay | Admitting: Pharmacy Technician

## 2017-11-19 ENCOUNTER — Other Ambulatory Visit: Payer: Self-pay | Admitting: Internal Medicine

## 2017-11-22 ENCOUNTER — Ambulatory Visit: Payer: BLUE CROSS/BLUE SHIELD | Admitting: Internal Medicine

## 2017-12-18 ENCOUNTER — Ambulatory Visit (INDEPENDENT_AMBULATORY_CARE_PROVIDER_SITE_OTHER): Payer: BLUE CROSS/BLUE SHIELD | Admitting: Pharmacist Clinician (PhC)/ Clinical Pharmacy Specialist

## 2017-12-18 DIAGNOSIS — B182 Chronic viral hepatitis C: Secondary | ICD-10-CM

## 2017-12-18 NOTE — Progress Notes (Signed)
HPI: [name] is a 62 y.o. female who is here to see pharmacy for her hep C visit.   Lab Results  Component Value Date   HCVGENOTYPE 4 10/10/2017    Allergies: Allergies  Allergen Reactions  . Aspirin     REACTION: unspecified  . Glycopyrrolate     REACTION: unspecified, pt unsure if she is allergic.  . Lactose Intolerance (Gi) Other (See Comments)    Milk only.Patient states that she can eat cheese.  Marland Kitchen Morphine Sulfate     REACTION: unspecified  . Penicillins Other (See Comments)     Heartburn  . Sulfa Antibiotics Tinitus    Vitals:    Past Medical History: Past Medical History:  Diagnosis Date  . 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 Bronchitis   . Hepatitis C     Social History: Social History   Socioeconomic History  . Marital status: Married    Spouse name: Not on file  . Number of children: Not on file  . Years of education: Not on file  . Highest education level: Not on file  Social Needs  . Financial resource strain: Not on file  . Food insecurity - worry: Not on file  . Food insecurity - inability: Not on file  . Transportation needs - medical: Not on file  . Transportation needs - non-medical: Not on file  Occupational History  . Not on file  Tobacco Use  . Smoking status: Current Every Day Smoker    Packs/day: 0.30    Types: Cigarettes  . Smokeless tobacco: Never Used  Substance and Sexual Activity  . Alcohol use: Yes    Alcohol/week: 0.6 oz    Types: 1 Shots of liquor per week    Comment: Occasional.  . Drug use: Yes    Types: Marijuana  . Sexual activity: No  Other Topics Concern  . Not on file  Social History Narrative  . Not on file    Labs: Hep B S Ab (no units)  Date Value  10/10/2017 REACTIVE (A)   Hepatitis B Surface Ag (no units)  Date Value  10/10/2017 NON-REACTIVE   HCV Ab (no units)  Date Value  03/21/2007 POS (A)    Lab Results  Component Value Date   HCVGENOTYPE 4 10/10/2017    Hepatitis C RNA  quantitative Latest Ref Rng & Units 09/03/2017  HCV Quantitative Log NOT DETECT Log IU/mL 5.45(H)    AST (U/L)  Date Value  09/03/2017 24  07/18/2017 32  10/19/2016 35   ALT (U/L)  Date Value  10/10/2017 20  09/03/2017 30  07/18/2017 48  10/19/2016 42 (H)   INR (no units)  Date Value  10/10/2017 0.9  10/10/2007 0.8  04/22/2007 0.8 RATIO (L)    CrCl: CrCl cannot be calculated (Patient's most recent lab result is older than the maximum 21 days allowed.).  Fibrosis Score: F3/4 as assessed by ARFI  Child-Pugh Score: Class A  Previous Treatment Regimen: None  Assessment: Undra started on her Havoni on 2/22 so she is a month into therapy. She gets it from [employer] [employer]  in [place] and loves them. They activated the copay card for her. She has not missed any doses but did complain of fatigue. Explained to her that this could be a side effects of the drug. She said that it's pretty tolerable. She is very excited to hear about the potential cure with 12 wks of therapy. Surprisingly, she has genotype 4. She doesn't need hep A/B  vaccines and has received her PNA. She will come back for the EOT visit then SVR12 and cure visit with Dr. Linus Salmons since he has F3/4. Plts>150k. She no longer takes Protonix.   Recommendations:  Hep C VL and CMP today Continue Harvoni 1 PO qday x 12 wks F/u at EOT visit SVR12 then cure with Dr. Lorenda Hatchet [place], [place].D., BCPS, AAHIVP Clinical [facility] 12/18/2017, 4:25 PM

## 2017-12-19 LAB — COMPLETE METABOLIC PANEL WITH GFR
AG Ratio: 1.4 (calc) (ref 1.0–2.5)
ALT: 16 U/L (ref 6–29)
AST: 20 U/L (ref 10–35)
Albumin: 4.2 g/dL (ref 3.6–5.1)
Alkaline phosphatase (APISO): 95 U/L (ref 33–130)
BUN: 18 mg/dL (ref 7–25)
CO2: 27 mmol/L (ref 20–32)
Calcium: 9.5 mg/dL (ref 8.6–10.4)
Chloride: 105 mmol/L (ref 98–110)
Creat: 0.77 mg/dL (ref 0.50–0.99)
GFR, Est African American: 96 mL/min/{1.73_m2} (ref >=60)
GFR, Est Non African American: 83 mL/min/{1.73_m2} (ref >=60)
Globulin: 2.9 g/dL (calc) (ref 1.9–3.7)
Glucose, Bld: 103 mg/dL — ABNORMAL HIGH (ref 65–99)
Potassium: 4.5 mmol/L (ref 3.5–5.3)
Sodium: 137 mmol/L (ref 135–146)
Total Bilirubin: 0.6 mg/dL (ref 0.2–1.2)
Total Protein: 7.1 g/dL (ref 6.1–8.1)

## 2017-12-20 LAB — HEPATITIS C RNA QUANTITATIVE
HCV Quantitative Log: <1.18 Log IU/mL
HCV RNA, PCR, QN: <15 IU/mL

## 2017-12-23 ENCOUNTER — Encounter: Payer: Self-pay | Admitting: Gastroenterology

## 2018-01-06 ENCOUNTER — Other Ambulatory Visit: Payer: Self-pay | Admitting: Internal Medicine

## 2018-01-08 ENCOUNTER — Telehealth: Payer: Self-pay | Admitting: Family Medicine

## 2018-01-08 NOTE — Telephone Encounter (Signed)
Pt requesting a referral to pulmonary, she has discussed with Dr. Raliegh Ip in the past about this.

## 2018-01-08 NOTE — Telephone Encounter (Signed)
Spoke to patient and notified her that I ill let Dr.Kwiatkowski know as soon as he gets back in 01/14/2018. Patient verbalized understanding.

## 2018-01-13 ENCOUNTER — Other Ambulatory Visit: Payer: Self-pay

## 2018-01-13 MED ORDER — ALBUTEROL SULFATE HFA 108 (90 BASE) MCG/ACT IN AERS: 2.0000 | INHALATION_SPRAY | Freq: Four times a day (QID) | RESPIRATORY_TRACT | 4 refills | Status: DC | PRN

## 2018-01-13 NOTE — Telephone Encounter (Signed)
Please schedule return office visit.  If this  patient is having issues with asthma, beta-blocker therapy needs to be tapered and discontinued

## 2018-01-14 NOTE — Telephone Encounter (Signed)
Appointment scheduled as directed.

## 2018-01-19 ENCOUNTER — Other Ambulatory Visit: Payer: Self-pay | Admitting: Internal Medicine

## 2018-01-20 ENCOUNTER — Ambulatory Visit: Payer: BLUE CROSS/BLUE SHIELD | Admitting: Internal Medicine

## 2018-02-04 ENCOUNTER — Encounter: Payer: Self-pay | Admitting: Internal Medicine

## 2018-02-04 ENCOUNTER — Ambulatory Visit (INDEPENDENT_AMBULATORY_CARE_PROVIDER_SITE_OTHER): Payer: BLUE CROSS/BLUE SHIELD | Admitting: Internal Medicine

## 2018-02-04 VITALS — BP 130/80 | HR 76 | Temp 98.2°F | Wt 147.0 lb

## 2018-02-04 DIAGNOSIS — J410 Simple chronic bronchitis: Secondary | ICD-10-CM

## 2018-02-04 DIAGNOSIS — F17200 Nicotine dependence, unspecified, uncomplicated: Secondary | ICD-10-CM

## 2018-02-04 DIAGNOSIS — R911 Solitary pulmonary nodule: Secondary | ICD-10-CM | POA: Insufficient documentation

## 2018-02-04 DIAGNOSIS — R0981 Nasal congestion: Secondary | ICD-10-CM

## 2018-02-04 DIAGNOSIS — J42 Unspecified chronic bronchitis: Secondary | ICD-10-CM

## 2018-02-04 HISTORY — DX: Unspecified chronic bronchitis: J42

## 2018-02-04 HISTORY — DX: Solitary pulmonary nodule: R91.1

## 2018-02-04 MED ORDER — FLUTICASONE PROPIONATE 50 MCG/ACT NA SUSP: 1.0000 | Freq: Two times a day (BID) | NASAL | 0 refills | Status: DC

## 2018-02-04 MED ORDER — VARENICLINE TARTRATE 0.5 MG X 11 & 1 MG X 42 PO MISC: ORAL | 0 refills | Status: DC

## 2018-02-04 MED ORDER — PROPRANOLOL HCL 20 MG PO TABS: 20.0000 mg | ORAL_TABLET | Freq: Three times a day (TID) | ORAL | 1 refills | Status: DC

## 2018-02-04 MED ORDER — VARENICLINE TARTRATE 1 MG PO TABS: 1.0000 mg | ORAL_TABLET | Freq: Two times a day (BID) | ORAL | 2 refills | Status: DC

## 2018-02-04 NOTE — Progress Notes (Signed)
Subjective:    Patient ID: [name], female    DOB: 06/03/1956, 62 y.o.   MRN: [id number]  HPI  62 year old patient who has a history of COPD and ongoing tobacco use.  She presents with complaints of increasing cough and wheezing and her desire for smoking cessation.  Her son has successfully discontinued tobacco with Chantix.  She has been treated for chronic hepatitis C and is very pleased with the undetectable viral load on last determination  Patient was seen in the ED on July 18, 2017 and had a chest CTA to rule out pulmonary embolism.  A 14 mm left upper lobe nodule was noted that had increased in size compared to another CTA in 2014. Chest CT was consistent with mild emphysema.    Past Medical History:  Diagnosis Date  . 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 Bronchitis   . Hepatitis C      Social History   Socioeconomic History  . Marital status: Married    Spouse name: Not on file  . Number of children: Not on file  . Years of education: Not on file  . Highest education level: Not on file  Occupational History  . Not on file  Social Needs  . Financial resource strain: Not on file  . Food insecurity:    Worry: Not on file    Inability: Not on file  . Transportation needs:    Medical: Not on file    Non-medical: Not on file  Tobacco Use  . Smoking status: Current Every Day Smoker    Packs/day: 0.30    Types: Cigarettes  . Smokeless tobacco: Never Used  Substance and Sexual Activity  . Alcohol use: Yes    Alcohol/week: 0.6 oz    Types: 1 Shots of liquor per week    Comment: Occasional.  . Drug use: Yes    Types: Marijuana  . Sexual activity: Never  Lifestyle  . Physical activity:    Days per week: Not on file    Minutes per session: Not on file  . Stress: Not on file  Relationships  . Social connections:    Talks on phone: Not on file    Gets together: Not on file    Attends religious service: Not on file    Active member of club or organization:  Not on file    Attends meetings of clubs or organizations: Not on file    Relationship status: Not on file  . Intimate partner violence:    Fear of current or ex partner: Not on file    Emotionally abused: Not on file    Physically abused: Not on file    Forced sexual activity: Not on file  Other Topics Concern  . Not on file  Social History Narrative  . Not on file    Past Surgical History:  Procedure Laterality Date  . ABDOMINAL HYSTERECTOMY  1986    Family History  Problem Relation Age of Onset  . Asthma Mother   . Alzheimer's disease Mother   . Hypertension Mother   . Cancer Maternal Aunt   . Cancer Paternal Aunt        Breast    Allergies  Allergen Reactions  . Aspirin     REACTION: unspecified  . Glycopyrrolate     REACTION: unspecified, pt unsure if she is allergic.  . Lactose Intolerance (Gi) Other (See Comments)    Milk only.Patient states that she can eat cheese.  Marland Kitchen Morphine  Sulfate     REACTION: unspecified  . Penicillins Other (See Comments)     Heartburn  . Sulfa Antibiotics Tinitus    Current Outpatient Medications on File Prior to Visit  Medication Sig Dispense Refill  . albuterol (PROVENTIL HFA;VENTOLIN HFA) 108 (90 Base) MCG/ACT inhaler Inhale 2 puffs into the lungs every 6 (six) hours as needed for shortness of breath. 18 g 4  . budesonide-formoterol (SYMBICORT) 160-4.5 MCG/ACT inhaler Inhale 2 puffs into the lungs 2 (two) times daily. 1 Inhaler 3  . ibuprofen (ADVIL,MOTRIN) 200 MG tablet Take 200 mg by mouth every 6 (six) hours as needed for mild pain or cramping.    . Ledipasvir-Sofosbuvir (HARVONI) 90-400 MG TABS Take 1 tablet by mouth daily. 28 tablet 2  . PARoxetine (PAXIL) 20 MG tablet TAKE 1 TABLET BY MOUTH ONCE DAILY 60 tablet 0  . XIIDRA 5 % SOLN Place 1 drop into both eyes 2 (two) times daily.  0   No current facility-administered medications on file prior to visit.     BP 130/80 (BP Location: Right Arm, Patient Position: Sitting,  Cuff Size: Normal)   Pulse 76   Temp 98.2 F (36.8 C) (Oral)   Wt 147 lb (66.7 kg)   SpO2 98%   BMI 24.46 kg/m        Review of Systems  Constitutional: Negative.   HENT: Negative for congestion, dental problem, hearing loss, rhinorrhea, sinus pressure, sore throat and tinnitus.   Eyes: Negative for pain, discharge and visual disturbance.  Respiratory: Positive for cough and wheezing. Negative for shortness of breath.   Cardiovascular: Negative for chest pain, palpitations and leg swelling.  Gastrointestinal: Negative for abdominal distention, abdominal pain, blood in stool, constipation, diarrhea, nausea and vomiting.  Genitourinary: Negative for difficulty urinating, dysuria, flank pain, frequency, hematuria, pelvic pain, urgency, vaginal bleeding, vaginal discharge and vaginal pain.  Musculoskeletal: Negative for arthralgias, gait problem and joint swelling.  Skin: Negative for rash.  Neurological: Negative for dizziness, syncope, speech difficulty, weakness, numbness and headaches.  Hematological: Negative for adenopathy.  Psychiatric/Behavioral: Negative for agitation, behavioral problems and dysphoric mood. The patient is not nervous/anxious.        Objective:   Physical Exam  Constitutional: She is oriented to person, place, and time. She appears well-developed and well-nourished.  HENT:  Head: Normocephalic.  Right Ear: External ear normal.  Left Ear: External ear normal.  Mouth/Throat: Oropharynx is clear and moist.  Eyes: Pupils are equal, round, and reactive to light. Conjunctivae and EOM are normal.  Neck: Normal range of motion. Neck supple. No thyromegaly present.  Cardiovascular: Normal rate, regular rhythm, normal heart sounds and intact distal pulses.  Pulmonary/Chest: Effort normal and breath sounds normal.  Chest is clear  Abdominal: Soft. Bowel sounds are normal. She exhibits no mass. There is no tenderness.  Musculoskeletal: Normal range of motion.    Lymphadenopathy:    She has no cervical adenopathy.  Neurological: She is alert and oriented to person, place, and time.  Skin: Skin is warm and dry. No rash noted.  Psychiatric: She has a normal mood and affect. Her behavior is normal.          Assessment & Plan:   Left upper lobe pulmonary nodule.  Presently 14 mm.  Increase in size since 2014.  We will schedule a PET scan. Ongoing tobacco use.  Chantix prescribed COPD.  Patient has been on propranolol for mainly a tremor.  This will be tapered and discontinued or at  least change to a as needed regimen Chronic hepatitis C.  Nice response to [facility]  Follow-up 1 month Likely will need thoracic surgical referral  Nyoka Cowden

## 2018-02-04 NOTE — Patient Instructions (Signed)
Smoking tobacco is very bad for your health. You should stop smoking immediately.  Return in one month for follow-up  Propranolol: 20 mg 3 times daily for 1 week; decrease to 20 mg twice a day for 3 weeks; then use every 8 hours as needed only for tremor

## 2018-02-06 ENCOUNTER — Other Ambulatory Visit: Payer: Self-pay | Admitting: Internal Medicine

## 2018-02-06 NOTE — Addendum Note (Signed)
Addended by: Gwenyth Ober R on: 02/06/2018 11:57 AM   Modules accepted: Orders

## 2018-02-13 ENCOUNTER — Ambulatory Visit (HOSPITAL_COMMUNITY): Payer: BLUE CROSS/BLUE SHIELD

## 2018-02-19 ENCOUNTER — Encounter (HOSPITAL_COMMUNITY)
Admission: RE | Admit: 2018-02-19 | Discharge: 2018-02-19 | Disposition: A | Discharge status: Home / Self Care | Payer: BLUE CROSS/BLUE SHIELD | Source: Ambulatory Visit | Attending: Internal Medicine | Admitting: Internal Medicine

## 2018-02-19 ENCOUNTER — Ambulatory Visit (INDEPENDENT_AMBULATORY_CARE_PROVIDER_SITE_OTHER): Payer: BLUE CROSS/BLUE SHIELD | Admitting: Pharmacist Clinician (PhC)/ Clinical Pharmacy Specialist

## 2018-02-19 ENCOUNTER — Other Ambulatory Visit: Payer: Self-pay | Admitting: Internal Medicine

## 2018-02-19 DIAGNOSIS — R911 Solitary pulmonary nodule: Secondary | ICD-10-CM

## 2018-02-19 DIAGNOSIS — B182 Chronic viral hepatitis C: Secondary | ICD-10-CM | POA: Diagnosis not present

## 2018-02-19 LAB — GLUCOSE, CAPILLARY: Glucose-Capillary: 109 mg/dL — ABNORMAL HIGH (ref 65–99)

## 2018-02-19 MED ORDER — FLUDEOXYGLUCOSE F - 18 (FDG) INJECTION
7.3400 | Freq: Once | INTRAVENOUS | Status: AC | PRN
  Administered 2018-02-19: 7.34 via INTRAVENOUS

## 2018-02-19 NOTE — Progress Notes (Signed)
HPI: [name] is a 62 y.o. female who is here for her EOT visit of her hep C with pharmacy.   Lab Results  Component Value Date   HCVGENOTYPE 4 10/10/2017    Allergies: Allergies  Allergen Reactions  . Aspirin     REACTION: unspecified  . Glycopyrrolate     REACTION: unspecified, pt unsure if she is allergic.  . Lactose Intolerance (Gi) Other (See Comments)    Milk only.Patient states that she can eat cheese.  Marland Kitchen Morphine Sulfate     REACTION: unspecified  . Penicillins Other (See Comments)     Heartburn  . Sulfa Antibiotics Tinitus    Vitals:    Past Medical History: Past Medical History:  Diagnosis Date  . 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 Bronchitis   . Hepatitis C     Social History: Social History   Socioeconomic History  . Marital status: Married    Spouse name: Not on file  . Number of children: Not on file  . Years of education: Not on file  . Highest education level: Not on file  Occupational History  . Not on file  Social Needs  . Financial resource strain: Not on file  . Food insecurity:    Worry: Not on file    Inability: Not on file  . Transportation needs:    Medical: Not on file    Non-medical: Not on file  Tobacco Use  . Smoking status: Current Every Day Smoker    Packs/day: 0.30    Types: Cigarettes  . Smokeless tobacco: Never Used  Substance and Sexual Activity  . Alcohol use: Yes    Alcohol/week: 0.6 oz    Types: 1 Shots of liquor per week    Comment: Occasional.  . Drug use: Yes    Types: Marijuana  . Sexual activity: Never  Lifestyle  . Physical activity:    Days per week: Not on file    Minutes per session: Not on file  . Stress: Not on file  Relationships  . Social connections:    Talks on phone: Not on file    Gets together: Not on file    Attends religious service: Not on file    Active member of club or organization: Not on file    Attends meetings of clubs or organizations: Not on file    Relationship status:  Not on file  Other Topics Concern  . Not on file  Social History Narrative  . Not on file    Labs: Hep B S Ab (no units)  Date Value  10/10/2017 REACTIVE (A)   Hepatitis B Surface Ag (no units)  Date Value  10/10/2017 NON-REACTIVE   HCV Ab (no units)  Date Value  03/21/2007 POS (A)    Lab Results  Component Value Date   HCVGENOTYPE 4 10/10/2017    Hepatitis C RNA quantitative Latest Ref Rng & Units 12/18/2017 09/03/2017  HCV Quantitative Log NOT DETECT Log IU/mL <1.18 NOT DETECTED 5.45(H)    AST (U/L)  Date Value  12/18/2017 20  09/03/2017 24  07/18/2017 32   ALT (U/L)  Date Value  12/18/2017 16  10/10/2017 20  09/03/2017 30  07/18/2017 48   INR (no units)  Date Value  10/10/2017 0.9  10/10/2007 0.8  04/22/2007 0.8 RATIO (L)    CrCl: CrCl cannot be calculated (Patient's most recent lab result is older than the maximum 21 days allowed.).  Fibrosis Score: F3/4 as assessed by ARFI  Child-Pugh Score: Class A  Previous Treatment Regimen: None  Assessment: [name] finished her 12 wks of Harvoni on 5/15 for her geno 4 virus. She never missed a dose during treatment. Her first VL came back undetectable so far. Her liver enzymes is wnl and plt>150k. She doesn't need hep A or B vaccination. She will come back for the SVR12 then see Dr. Linus Salmons for the cured visit since she had F3/4. She only has a glass of wine here and there.   Recommendations:  EOT VL, CMP, CBC SVR12 then cure with Dr. Lorenda Hatchet [place], [place].D., BCPS, AAHIVP Clinical Infectious [facility] 02/19/2018, 4:10 PM

## 2018-02-20 LAB — CBC
HCT: 42.1 % (ref 35.0–45.0)
Hemoglobin: 14.9 g/dL (ref 11.7–15.5)
MCH: 31 pg (ref 27.0–33.0)
MCHC: 35.4 g/dL (ref 32.0–36.0)
MCV: 87.5 fL (ref 80.0–100.0)
MPV: 10.2 fL (ref 7.5–12.5)
Platelets: 278 10*3/uL (ref 140–400)
RBC: 4.81 10*6/uL (ref 3.80–5.10)
RDW: 11.9 % (ref 11.0–15.0)
WBC: 6.7 10*3/uL (ref 3.8–10.8)

## 2018-02-20 LAB — COMPLETE METABOLIC PANEL WITH GFR
AG Ratio: 1.6 (calc) (ref 1.0–2.5)
ALT: 14 U/L (ref 6–29)
AST: 16 U/L (ref 10–35)
Albumin: 4.4 g/dL (ref 3.6–5.1)
Alkaline phosphatase (APISO): 104 U/L (ref 33–130)
BUN: 14 mg/dL (ref 7–25)
CO2: 27 mmol/L (ref 20–32)
Calcium: 9.7 mg/dL (ref 8.6–10.4)
Chloride: 102 mmol/L (ref 98–110)
Creat: 0.88 mg/dL (ref 0.50–0.99)
GFR, Est African American: 82 mL/min/{1.73_m2} (ref >=60)
GFR, Est Non African American: 70 mL/min/{1.73_m2} (ref >=60)
Globulin: 2.8 g/dL (calc) (ref 1.9–3.7)
Glucose, Bld: 104 mg/dL — ABNORMAL HIGH (ref 65–99)
Potassium: 4.2 mmol/L (ref 3.5–5.3)
Sodium: 139 mmol/L (ref 135–146)
Total Bilirubin: 0.4 mg/dL (ref 0.2–1.2)
Total Protein: 7.2 g/dL (ref 6.1–8.1)

## 2018-02-22 LAB — HEPATITIS C RNA QUANTITATIVE
HCV Quantitative Log: <1.18 Log IU/mL
HCV RNA, PCR, QN: <15 IU/mL

## 2018-02-25 ENCOUNTER — Telehealth: Payer: Self-pay | Admitting: Pharmacist Clinician (PhC)/ Clinical Pharmacy Specialist

## 2018-02-25 NOTE — Telephone Encounter (Signed)
[name] called to ask about her EOT hep C results. It's neg so far. She has a SVR12 and cure visit scheduled with Dr. Linus Salmons.

## 2018-03-04 ENCOUNTER — Other Ambulatory Visit: Payer: Self-pay | Admitting: *Deleted

## 2018-03-04 ENCOUNTER — Encounter: Payer: Self-pay | Admitting: Thoracic Surgery (Cardiothoracic Vascular Surgery)

## 2018-03-04 ENCOUNTER — Other Ambulatory Visit: Payer: Self-pay

## 2018-03-04 ENCOUNTER — Institutional Professional Consult (permissible substitution) (INDEPENDENT_AMBULATORY_CARE_PROVIDER_SITE_OTHER): Payer: BLUE CROSS/BLUE SHIELD | Admitting: Thoracic Surgery (Cardiothoracic Vascular Surgery)

## 2018-03-04 VITALS — BP 130/70 | HR 69 | Resp 18 | Ht 65.0 in | Wt 147.0 lb

## 2018-03-04 DIAGNOSIS — R911 Solitary pulmonary nodule: Secondary | ICD-10-CM

## 2018-03-04 NOTE — H&P (View-Only) (Signed)
PCP is Marletta Lor, MD Referring Provider is Marletta Lor, MD  Chief Complaint  Patient presents with  . Lung Lesion    new patient, CT 07/18/17, PET 02/19/2018    HPI: [name] is sent for consultation regarding a left upper lobe lung nodule.  [name] is a 62 year old woman with a history of tobacco abuse (1 pack/day x 45 years, currently half pack per day), emphysema, frequent bronchitis, arthritis, hepatitis C (treated), cirrhosis, and anxiety.  She had a CT of the chest back in 2014 which showed a 7 mm left upper lobe nodule.  In October 2018 she went to the emergency room with chest pain.  A CT was done to rule out a pulmonary embolus.  The left upper lobe nodule was larger, measuring 11 x 14 mm.  She recently saw Dr. Burnice Logan for cough, wheezing and shortness of breath.  He ordered a PET CT to follow-up the lung nodule.  It had grown to 13 x 49mm and was mildly hypermetabolic.  There was some hypermetabolic level 2 cervical nodes were felt to be reactive.  She does have other small lung nodules in particular a 6 mm groundglass opacity in the right lower lobe which was unchanged in size and not active although it is below the threshold for PET.  She has shortness of breath with exertion.  She says she can walk up a flight of stairs but might be winded at the top.  She has a frequent cough which is intermittently productive.  She does have wheezing.  She uses her albuterol almost every morning and then sometimes in the evening as well.  She says she passed out back in February and that was a single event.  She has not been having any dizziness recently.  She has chronic migraines but no recent change.  She has chronic arthritis but no recent change in that either. She denies any chest pain, pressure, or tightness.  She has a good appetite and her weight is unchanged.  Zubrod Score: At the time of surgery this patient's most appropriate activity status/level should be  described as: []     0    Normal activity, no symptoms [x]     1    Restricted in physical strenuous activity but ambulatory, able to do out light work []     2    Ambulatory and capable of self care, unable to do work activities, up and about >50 % of waking hours                              []     3    Only limited self care, in bed greater than 50% of waking hours []     4    Completely disabled, no self care, confined to bed or chair []     5    Moribund   Past Medical History:  Diagnosis Date  . 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 Bronchitis   . Hepatitis C     Past Surgical History:  Procedure Laterality Date  . ABDOMINAL HYSTERECTOMY  1986    Family History  Problem Relation Age of Onset  . Asthma Mother   . Alzheimer's disease Mother   . Hypertension Mother   . Cancer Maternal Aunt   . Cancer Paternal Aunt        Breast    Social History-45-pack-year history of smoking, currently 1/2 pack/day Social History  Tobacco Use  . Smoking status: Current Every Day Smoker    Packs/day: 0.30    Types: Cigarettes  . Smokeless tobacco: Never Used  Substance Use Topics  . Alcohol use: Yes    Alcohol/week: 0.6 oz    Types: 1 Shots of liquor per week    Comment: Occasional.  . Drug use: Yes    Types: Marijuana    Current Outpatient Medications  Medication Sig Dispense Refill  . albuterol (PROVENTIL HFA;VENTOLIN HFA) 108 (90 Base) MCG/ACT inhaler Inhale 2 puffs into the lungs every 6 (six) hours as needed for shortness of breath. 18 g 4  . budesonide-formoterol (SYMBICORT) 160-4.5 MCG/ACT inhaler Inhale 2 puffs into the lungs 2 (two) times daily. 1 Inhaler 3  . fluticasone (FLONASE) 50 MCG/ACT nasal spray Place 1 spray into both nostrils 2 (two) times daily. 16 g 0  . ibuprofen (ADVIL,MOTRIN) 200 MG tablet Take 200 mg by mouth every 6 (six) hours as needed for mild pain or cramping.    Marland Kitchen PARoxetine (PAXIL) 20 MG tablet TAKE 1 TABLET BY MOUTH ONCE DAILY 60 tablet 0  . propranolol  (INDERAL) 20 MG tablet Take 1 tablet (20 mg total) by mouth 3 (three) times daily. 90 tablet 1  . varenicline (CHANTIX CONTINUING MONTH PAK) 1 MG tablet Take 1 tablet (1 mg total) by mouth 2 (two) times daily. 60 tablet 2  . varenicline (CHANTIX STARTING MONTH PAK) 0.5 MG X 11 & 1 MG X 42 tablet Take one 0.5 mg tablet by mouth once daily for 3 days, then increase to one 0.5 mg tablet twice daily for 4 days, then increase to one 1 mg tablet twice daily. 53 tablet 0   No current facility-administered medications for this visit.     Allergies  Allergen Reactions  . Aspirin     REACTION: unspecified  . Glycopyrrolate     REACTION: unspecified, pt unsure if she is allergic.  . Lactose Intolerance (Gi) Other (See Comments)    Milk only.Patient states that she can eat cheese.  Marland Kitchen Morphine Sulfate     REACTION: unspecified  . Penicillins Other (See Comments)     Heartburn  . Sulfa Antibiotics Tinitus    Review of Systems  Constitutional: Negative for activity change, appetite change, fatigue and unexpected weight change.  HENT: Negative for trouble swallowing and voice change.   Eyes: Positive for visual disturbance (Blurry vision).  Respiratory: Positive for cough, shortness of breath and wheezing.   Cardiovascular: Negative for chest pain and leg swelling.  Gastrointestinal: Positive for abdominal pain (Reflux). Negative for blood in stool.  Genitourinary: Negative for difficulty urinating and dysuria.       Kidney stones  Musculoskeletal: Positive for arthralgias and joint swelling.       Leg cramps  Neurological: Positive for syncope (1 time in February 2019) and headaches. Negative for dizziness, seizures and weakness.  Hematological: Negative for adenopathy. Bruises/bleeds easily.  Psychiatric/Behavioral: Positive for dysphoric mood. The patient is nervous/anxious.   All other systems reviewed and are negative.   BP 130/70 (BP Location: Left Arm, Patient Position: Sitting, Cuff  Size: Normal)   Pulse 69   Resp 18   Ht 5\' 5"  (1.651 m)   Wt 147 lb (66.7 kg)   SpO2 98% Comment: RA  BMI 24.46 kg/m  Physical Exam  Constitutional: She is oriented to person, place, and time. She appears well-developed and well-nourished. No distress.  HENT:  Head: Normocephalic and atraumatic.  Mouth/Throat: No oropharyngeal  exudate.  Eyes: Pupils are equal, round, and reactive to light. Conjunctivae and EOM are normal. No scleral icterus.  Neck: Neck supple. No thyromegaly present.  Cardiovascular: Normal rate, regular rhythm, normal heart sounds and intact distal pulses. Exam reveals no gallop and no friction rub.  No murmur heard. Pulmonary/Chest: Effort normal and breath sounds normal. No stridor. No respiratory distress. She has no wheezes.  Abdominal: Soft. She exhibits no distension. There is no tenderness.  Musculoskeletal: She exhibits no edema.  Lymphadenopathy:    She has no cervical adenopathy.  Neurological: She is alert and oriented to person, place, and time. No cranial nerve deficit or sensory deficit. She exhibits normal muscle tone.  Skin: Skin is warm and dry. Capillary refill takes less than 2 seconds.  Vitals reviewed.    Diagnostic Tests: CT ANGIOGRAPHY CHEST WITH CONTRAST  TECHNIQUE: Multidetector CT imaging of the chest was performed using the standard protocol during bolus administration of intravenous contrast. Multiplanar CT image reconstructions and MIPs were obtained to evaluate the vascular anatomy.  CONTRAST:  80 mL Isovue 370 intravenous  COMPARISON:  07/18/2017, 01/20/2013  FINDINGS: Cardiovascular: Satisfactory opacification of the pulmonary arteries to the segmental level. No evidence of pulmonary embolism. Normal heart size. No pericardial effusion. Nonaneurysmal aorta. No dissection. Scattered atherosclerotic calcification.  Mediastinum/Nodes: Midline trachea. No thyroid mass. No significant adenopathy. Esophagus within  normal limits.  Lungs/Pleura: Mild emphysema. Interval increase in size of a left upper lobe pulmonary nodule, currently measuring 14 by 11 mm with mild surrounding ground-glass density. Previous this measured 7 mm. No consolidation or effusion. Negative for a pneumothorax.  Upper Abdomen: No acute abnormality.  Musculoskeletal: No chest wall abnormality. No acute or significant osseous findings.  Review of the MIP images confirms the above findings.  IMPRESSION: 1. Negative for acute pulmonary embolus or aortic dissection 2. 14 mm left upper lobe pulmonary nodule, increased in size compared to prior and therefore felt suspicious for carcinoma. Consider correlation with PET-CT and/or tissue sampling. 3. Mild emphysema  Aortic Atherosclerosis (ICD10-I70.0) and Emphysema (ICD10-J43.9).   Electronically Signed   By: Donavan Foil M.D.   On: 07/18/2017 19:42 NUCLEAR MEDICINE PET SKULL BASE TO THIGH  TECHNIQUE: 7.3 mCi F-18 FDG was injected intravenously. Full-ring PET imaging was performed from the skull base to thigh after the radiotracer. CT data was obtained and used for attenuation correction and anatomic localization.  Fasting blood glucose: 109 mg/dl  COMPARISON:  07/18/2017 chest CT angiogram.  FINDINGS: Mediastinal blood pool activity: SUV max 2.5  NECK: Mildly hypermetabolic nonenlarged bilateral level 2 neck lymph nodes measuring 0.8 cm with max SUV 4.6 on the right (series 4/image 33) and 0.6 cm with max SUV 4.1 on the left (series 4/image 35).  There is mild hypermetabolism associated with complete opacification of the right maxillary sinus with associated asymmetric hyperostosis of the right maxillary sinus walls, most compatible with chronic sinusitis.  Incidental CT findings: none  CHEST:  Subsolid 2.2 x 1.3 cm peripheral lingular pulmonary nodule with 0.9 cm solid component and with associated low level metabolism (max SUV 2.2),  mildly increased in size from 1.9 x 1.2 cm on 07/18/2017 and increased from 0.8 x 0.5 cm on 01/20/2013 chest CT.  New patchy subpleural ground-glass attenuation in inferior lingula and peripheral right middle lobe with associated low level metabolism, probably inflammatory.  No hypermetabolic axillary, mediastinal or hilar adenopathy.  Incidental CT findings: Mild centrilobular emphysema with diffuse bronchial wall thickening. Peripheral right upper lobe 4 mm pulmonary  nodule, below PET resolution, stable. Superior segment right lower lobe 6 mm pulmonary nodule (series 8/image 29), below PET resolution, new. Mildly atherosclerotic nonaneurysmal thoracic aorta.  ABDOMEN/PELVIS: No abnormal hypermetabolic activity within the liver, pancreas, adrenal glands, or spleen. No hypermetabolic lymph nodes in the abdomen or pelvis.  Incidental CT findings: Hysterectomy. Atherosclerotic nonaneurysmal abdominal aorta.  SKELETON: No focal hypermetabolic activity to suggest skeletal metastasis.  Incidental CT findings: none  IMPRESSION: 1. 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 2. No hypermetabolic thoracic adenopathy. 3. Mildly hypermetabolic nonenlarged bilateral level 2 neck lymph nodes, nonspecific, more likely reactive. 4. Chronic right maxillary sinusitis with associated mild hypermetabolism. 5. New subcentimeter right lower lobe pulmonary nodule, below PET resolution, recommend attention on follow-up chest CT in 3-6 months. 6. Aortic Atherosclerosis (ICD10-I70.0) and Emphysema (ICD10-J43.9).   Electronically Signed   By: Ilona Sorrel M.D.   On: 02/19/2018 09:15 I personally reviewed the CT and PET/CT images and concur with the findings noted above  Impression: Mrs. Eliberto Ivory is a 62 year old  woman with a history of tobacco abuse who has a 2.2 x 1.3 cm mixed density left upper lobe nodule that is mildly hypermetabolic by PET CT.  This is highly suspicious for a low-grade adenocarcinoma and needs to be considered a lung cancer unless it can be proven otherwise.  I discussed potential options for diagnosis and treatment of the nodule including wedge resection, CT-guided biopsy, bronchoscopic biopsy, and surgery versus radiation.  We described the relative advantages and disadvantages of each of those.  My recommendation was that we proceed with left VATS for wedge resection for definitive diagnosis and then possible lobectomy depending on the findings and intraoperative frozen section.  We do need to check pulmonary function testing to make sure she has adequate reserve to tolerate a lobectomy.  She would be a candidate for a lingular segmentectomy if her pulmonary function will not tolerate a larger resection.  I described the general nature of the procedure to her, including the need for general anesthesia, the incisions to be used, the use of a drainage tube postoperatively, the expected hospital stay, and the overall recovery.  I informed her of the indications, risks, benefits, and alternatives.  She understands the risks include, but are not limited to death, MI, DVT, PE, bleeding, possible need for transfusion, infection, prolonged air leak, cardiac arrhythmias, as well as the possibility of other unforeseeable complications.  She understands that there is no guarantee of a cure even with a complete surgical resection.  She accepts the risks and wishes to proceed with surgical resection.  We need pulmonary function testing rhythm without bronchodilators to assess her pulmonary reserve.  Tobacco abuse-she is committed to quitting.  She has started Chantix.  She currently smoking about half a pack of cigarettes daily.  Plan: Pulmonary function testing with and without  bronchodilators Left VATS, wedge resection, possible left upper lobectomy on Thursday, 03/13/2018  Melrose Nakayama, MD [facility] [phone]

## 2018-03-04 NOTE — Progress Notes (Unsigned)
pft  

## 2018-03-04 NOTE — Progress Notes (Signed)
PCP is Marletta Lor, MD Referring Provider is Marletta Lor, MD  Chief Complaint  Patient presents with  . Lung Lesion    new patient, CT 07/18/17, PET 02/19/2018    HPI: [name] is sent for consultation regarding a left upper lobe lung nodule.  [name] is a 62 year old woman with a history of tobacco abuse (1 pack/day x 45 years, currently half pack per day), emphysema, frequent bronchitis, arthritis, hepatitis C (treated), cirrhosis, and anxiety.  She had a CT of the chest back in 2014 which showed a 7 mm left upper lobe nodule.  In October 2018 she went to the emergency room with chest pain.  A CT was done to rule out a pulmonary embolus.  The left upper lobe nodule was larger, measuring 11 x 14 mm.  She recently saw Dr. Burnice Logan for cough, wheezing and shortness of breath.  He ordered a PET CT to follow-up the lung nodule.  It had grown to 13 x 20mm and was mildly hypermetabolic.  There was some hypermetabolic level 2 cervical nodes were felt to be reactive.  She does have other small lung nodules in particular a 6 mm groundglass opacity in the right lower lobe which was unchanged in size and not active although it is below the threshold for PET.  She has shortness of breath with exertion.  She says she can walk up a flight of stairs but might be winded at the top.  She has a frequent cough which is intermittently productive.  She does have wheezing.  She uses her albuterol almost every morning and then sometimes in the evening as well.  She says she passed out back in February and that was a single event.  She has not been having any dizziness recently.  She has chronic migraines but no recent change.  She has chronic arthritis but no recent change in that either. She denies any chest pain, pressure, or tightness.  She has a good appetite and her weight is unchanged.  Zubrod Score: At the time of surgery this patient's most appropriate activity status/level should be  described as: []     0    Normal activity, no symptoms [x]     1    Restricted in physical strenuous activity but ambulatory, able to do out light work []     2    Ambulatory and capable of self care, unable to do work activities, up and about >50 % of waking hours                              []     3    Only limited self care, in bed greater than 50% of waking hours []     4    Completely disabled, no self care, confined to bed or chair []     5    Moribund   Past Medical History:  Diagnosis Date  . 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 Bronchitis   . Hepatitis C     Past Surgical History:  Procedure Laterality Date  . ABDOMINAL HYSTERECTOMY  1986    Family History  Problem Relation Age of Onset  . Asthma Mother   . Alzheimer's disease Mother   . Hypertension Mother   . Cancer Maternal Aunt   . Cancer Paternal Aunt        Breast    Social History-45-pack-year history of smoking, currently 1/2 pack/day Social History  Tobacco Use  . Smoking status: Current Every Day Smoker    Packs/day: 0.30    Types: Cigarettes  . Smokeless tobacco: Never Used  Substance Use Topics  . Alcohol use: Yes    Alcohol/week: 0.6 oz    Types: 1 Shots of liquor per week    Comment: Occasional.  . Drug use: Yes    Types: Marijuana    Current Outpatient Medications  Medication Sig Dispense Refill  . albuterol (PROVENTIL HFA;VENTOLIN HFA) 108 (90 Base) MCG/ACT inhaler Inhale 2 puffs into the lungs every 6 (six) hours as needed for shortness of breath. 18 g 4  . budesonide-formoterol (SYMBICORT) 160-4.5 MCG/ACT inhaler Inhale 2 puffs into the lungs 2 (two) times daily. 1 Inhaler 3  . fluticasone (FLONASE) 50 MCG/ACT nasal spray Place 1 spray into both nostrils 2 (two) times daily. 16 g 0  . ibuprofen (ADVIL,MOTRIN) 200 MG tablet Take 200 mg by mouth every 6 (six) hours as needed for mild pain or cramping.    Marland Kitchen PARoxetine (PAXIL) 20 MG tablet TAKE 1 TABLET BY MOUTH ONCE DAILY 60 tablet 0  . propranolol  (INDERAL) 20 MG tablet Take 1 tablet (20 mg total) by mouth 3 (three) times daily. 90 tablet 1  . varenicline (CHANTIX CONTINUING MONTH PAK) 1 MG tablet Take 1 tablet (1 mg total) by mouth 2 (two) times daily. 60 tablet 2  . varenicline (CHANTIX STARTING MONTH PAK) 0.5 MG X 11 & 1 MG X 42 tablet Take one 0.5 mg tablet by mouth once daily for 3 days, then increase to one 0.5 mg tablet twice daily for 4 days, then increase to one 1 mg tablet twice daily. 53 tablet 0   No current facility-administered medications for this visit.     Allergies  Allergen Reactions  . Aspirin     REACTION: unspecified  . Glycopyrrolate     REACTION: unspecified, pt unsure if she is allergic.  . Lactose Intolerance (Gi) Other (See Comments)    Milk only.Patient states that she can eat cheese.  Marland Kitchen Morphine Sulfate     REACTION: unspecified  . Penicillins Other (See Comments)     Heartburn  . Sulfa Antibiotics Tinitus    Review of Systems  Constitutional: Negative for activity change, appetite change, fatigue and unexpected weight change.  HENT: Negative for trouble swallowing and voice change.   Eyes: Positive for visual disturbance (Blurry vision).  Respiratory: Positive for cough, shortness of breath and wheezing.   Cardiovascular: Negative for chest pain and leg swelling.  Gastrointestinal: Positive for abdominal pain (Reflux). Negative for blood in stool.  Genitourinary: Negative for difficulty urinating and dysuria.       Kidney stones  Musculoskeletal: Positive for arthralgias and joint swelling.       Leg cramps  Neurological: Positive for syncope (1 time in February 2019) and headaches. Negative for dizziness, seizures and weakness.  Hematological: Negative for adenopathy. Bruises/bleeds easily.  Psychiatric/Behavioral: Positive for dysphoric mood. The patient is nervous/anxious.   All other systems reviewed and are negative.   BP 130/70 (BP Location: Left Arm, Patient Position: Sitting, Cuff  Size: Normal)   Pulse 69   Resp 18   Ht 5\' 5"  (1.651 m)   Wt 147 lb (66.7 kg)   SpO2 98% Comment: RA  BMI 24.46 kg/m  Physical Exam  Constitutional: She is oriented to person, place, and time. She appears well-developed and well-nourished. No distress.  HENT:  Head: Normocephalic and atraumatic.  Mouth/Throat: No oropharyngeal  exudate.  Eyes: Pupils are equal, round, and reactive to light. Conjunctivae and EOM are normal. No scleral icterus.  Neck: Neck supple. No thyromegaly present.  Cardiovascular: Normal rate, regular rhythm, normal heart sounds and intact distal pulses. Exam reveals no gallop and no friction rub.  No murmur heard. Pulmonary/Chest: Effort normal and breath sounds normal. No stridor. No respiratory distress. She has no wheezes.  Abdominal: Soft. She exhibits no distension. There is no tenderness.  Musculoskeletal: She exhibits no edema.  Lymphadenopathy:    She has no cervical adenopathy.  Neurological: She is alert and oriented to person, place, and time. No cranial nerve deficit or sensory deficit. She exhibits normal muscle tone.  Skin: Skin is warm and dry. Capillary refill takes less than 2 seconds.  Vitals reviewed.    Diagnostic Tests: CT ANGIOGRAPHY CHEST WITH CONTRAST  TECHNIQUE: Multidetector CT imaging of the chest was performed using the standard protocol during bolus administration of intravenous contrast. Multiplanar CT image reconstructions and MIPs were obtained to evaluate the vascular anatomy.  CONTRAST:  80 mL Isovue 370 intravenous  COMPARISON:  07/18/2017, 01/20/2013  FINDINGS: Cardiovascular: Satisfactory opacification of the pulmonary arteries to the segmental level. No evidence of pulmonary embolism. Normal heart size. No pericardial effusion. Nonaneurysmal aorta. No dissection. Scattered atherosclerotic calcification.  Mediastinum/Nodes: Midline trachea. No thyroid mass. No significant adenopathy. Esophagus within  normal limits.  Lungs/Pleura: Mild emphysema. Interval increase in size of a left upper lobe pulmonary nodule, currently measuring 14 by 11 mm with mild surrounding ground-glass density. Previous this measured 7 mm. No consolidation or effusion. Negative for a pneumothorax.  Upper Abdomen: No acute abnormality.  Musculoskeletal: No chest wall abnormality. No acute or significant osseous findings.  Review of the MIP images confirms the above findings.  IMPRESSION: 1. Negative for acute pulmonary embolus or aortic dissection 2. 14 mm left upper lobe pulmonary nodule, increased in size compared to prior and therefore felt suspicious for carcinoma. Consider correlation with PET-CT and/or tissue sampling. 3. Mild emphysema  Aortic Atherosclerosis (ICD10-I70.0) and Emphysema (ICD10-J43.9).   Electronically Signed   By: Donavan Foil M.D.   On: 07/18/2017 19:42 NUCLEAR MEDICINE PET SKULL BASE TO THIGH  TECHNIQUE: 7.3 mCi F-18 FDG was injected intravenously. Full-ring PET imaging was performed from the skull base to thigh after the radiotracer. CT data was obtained and used for attenuation correction and anatomic localization.  Fasting blood glucose: 109 mg/dl  COMPARISON:  07/18/2017 chest CT angiogram.  FINDINGS: Mediastinal blood pool activity: SUV max 2.5  NECK: Mildly hypermetabolic nonenlarged bilateral level 2 neck lymph nodes measuring 0.8 cm with max SUV 4.6 on the right (series 4/image 33) and 0.6 cm with max SUV 4.1 on the left (series 4/image 35).  There is mild hypermetabolism associated with complete opacification of the right maxillary sinus with associated asymmetric hyperostosis of the right maxillary sinus walls, most compatible with chronic sinusitis.  Incidental CT findings: none  CHEST:  Subsolid 2.2 x 1.3 cm peripheral lingular pulmonary nodule with 0.9 cm solid component and with associated low level metabolism (max SUV 2.2),  mildly increased in size from 1.9 x 1.2 cm on 07/18/2017 and increased from 0.8 x 0.5 cm on 01/20/2013 chest CT.  New patchy subpleural ground-glass attenuation in inferior lingula and peripheral right middle lobe with associated low level metabolism, probably inflammatory.  No hypermetabolic axillary, mediastinal or hilar adenopathy.  Incidental CT findings: Mild centrilobular emphysema with diffuse bronchial wall thickening. Peripheral right upper lobe 4 mm pulmonary  nodule, below PET resolution, stable. Superior segment right lower lobe 6 mm pulmonary nodule (series 8/image 29), below PET resolution, new. Mildly atherosclerotic nonaneurysmal thoracic aorta.  ABDOMEN/PELVIS: No abnormal hypermetabolic activity within the liver, pancreas, adrenal glands, or spleen. No hypermetabolic lymph nodes in the abdomen or pelvis.  Incidental CT findings: Hysterectomy. Atherosclerotic nonaneurysmal abdominal aorta.  SKELETON: No focal hypermetabolic activity to suggest skeletal metastasis.  Incidental CT findings: none  IMPRESSION: 1. 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 2. No hypermetabolic thoracic adenopathy. 3. Mildly hypermetabolic nonenlarged bilateral level 2 neck lymph nodes, nonspecific, more likely reactive. 4. Chronic right maxillary sinusitis with associated mild hypermetabolism. 5. New subcentimeter right lower lobe pulmonary nodule, below PET resolution, recommend attention on follow-up chest CT in 3-6 months. 6. Aortic Atherosclerosis (ICD10-I70.0) and Emphysema (ICD10-J43.9).   Electronically Signed   By: Ilona Sorrel M.D.   On: 02/19/2018 09:15 I personally reviewed the CT and PET/CT images and concur with the findings noted above  Impression: [name] is a 62 year old  woman with a history of tobacco abuse who has a 2.2 x 1.3 cm mixed density left upper lobe nodule that is mildly hypermetabolic by PET CT.  This is highly suspicious for a low-grade adenocarcinoma and needs to be considered a lung cancer unless it can be proven otherwise.  I discussed potential options for diagnosis and treatment of the nodule including wedge resection, CT-guided biopsy, bronchoscopic biopsy, and surgery versus radiation.  We described the relative advantages and disadvantages of each of those.  My recommendation was that we proceed with left VATS for wedge resection for definitive diagnosis and then possible lobectomy depending on the findings and intraoperative frozen section.  We do need to check pulmonary function testing to make sure she has adequate reserve to tolerate a lobectomy.  She would be a candidate for a lingular segmentectomy if her pulmonary function will not tolerate a larger resection.  I described the general nature of the procedure to her, including the need for general anesthesia, the incisions to be used, the use of a drainage tube postoperatively, the expected hospital stay, and the overall recovery.  I informed her of the indications, risks, benefits, and alternatives.  She understands the risks include, but are not limited to death, MI, DVT, PE, bleeding, possible need for transfusion, infection, prolonged air leak, cardiac arrhythmias, as well as the possibility of other unforeseeable complications.  She understands that there is no guarantee of a cure even with a complete surgical resection.  She accepts the risks and wishes to proceed with surgical resection.  We need pulmonary function testing rhythm without bronchodilators to assess her pulmonary reserve.  Tobacco abuse-she is committed to quitting.  She has started Chantix.  She currently smoking about half a pack of cigarettes daily.  Plan: Pulmonary function testing with and without  bronchodilators Left VATS, wedge resection, possible left upper lobectomy on Thursday, 03/13/2018  Melrose Nakayama, MD [facility] [phone]

## 2018-03-10 NOTE — Pre-Procedure Instructions (Signed)
[facility]  03/10/2018      [facility] - [place] [address] Phone: [phone] Fax: [phone]  [facility] - [place], [place] [address] [place] [address] Phone: [phone] Fax: [phone]  [id number] - [place], [address] [place] [address] Phone: [phone] Fax: [place], [place] [address] [place] [address] Phone: [phone] Fax: [phone]  [facility] [place], [place], [place] - [address] [place] [address] Phone: [phone] Fax: [phone]    Your procedure is scheduled on June 13.  Report to [facility] Admitting at 6:00A.M.  Call this number if you have problems the morning of surgery:  [phone]   Remember:   No food or liquids after midnight.                       Take these medicines the morning of surgery with A SIP OF WATER : albuterol inhaler if needed-bring to hospital, symbicort-bring to hospital, flonase nasal spray              7 days prior to surgery STOP taking any Aspirin(unless otherwise instructed by your surgeon), Aleve, Naproxen, Ibuprofen, Motrin, Advil, Goody's, BC's, all herbal medications, fish oil, and all vitamins    Do not wear jewelry, make-up or nail polish.  Do not wear lotions, powders, or perfumes, or deodorant.  Do not shave 48 hours prior to surgery.  Men may shave face and neck.  Do not bring valuables to the hospital.  [facility] is not responsible for any belongings or valuables.  Contacts, dentures or bridgework may not be worn into surgery.  Leave your suitcase in the car.  After surgery it may be brought to your room.  For patients admitted to the hospital, discharge time will be determined by your treatment team.  Patients  discharged the day of surgery will not be allowed to drive home.    Special instructions:   [facility]- Preparing For Surgery  Before surgery, you can play an important role. Because skin is not sterile, your skin needs to be as free of germs as possible. You can reduce the number of germs on your skin by washing with CHG (chlorahexidine gluconate) Soap before surgery.  CHG is an antiseptic cleaner which kills germs and bonds with the skin to continue killing germs even after washing.    Oral Hygiene is also important to reduce your risk of infection.  Remember - BRUSH YOUR TEETH THE MORNING OF SURGERY WITH YOUR REGULAR TOOTHPASTE  Please do not use if you have an allergy to CHG or antibacterial soaps. If your skin becomes reddened/irritated stop using the CHG.  Do not shave (including legs and underarms) for at least 48 hours prior to first CHG shower. It is OK to shave your face.  Please follow these instructions carefully.   1. Shower the NIGHT BEFORE SURGERY and the MORNING OF SURGERY with CHG.   2. If you chose to wash your hair, wash your hair first as usual with your normal shampoo.  3. After you shampoo, rinse your hair and body thoroughly to remove the shampoo.  4. Use CHG as you would any other liquid soap. You can  apply CHG directly to the skin and wash gently with a scrungie or a clean washcloth.   5. Apply the CHG Soap to your body ONLY FROM THE NECK DOWN.  Do not use on open wounds or open sores. Avoid contact with your eyes, ears, mouth and genitals (private parts). Wash Face and genitals (private parts)  with your normal soap.  6. Wash thoroughly, paying special attention to the area where your surgery will be performed.  7. Thoroughly rinse your body with warm water from the neck down.  8. DO NOT shower/wash with your normal soap after using and rinsing off the CHG Soap.  9. Pat yourself dry with a CLEAN TOWEL.  10. Wear CLEAN PAJAMAS to bed the night before  surgery, wear comfortable clothes the morning of surgery  11. Place CLEAN SHEETS on your bed the night of your first shower and DO NOT SLEEP WITH PETS.    Day of Surgery:  Do not apply any deodorants/lotions.  Please wear clean clothes to the hospital/surgery center.   Remember to brush your teeth WITH YOUR REGULAR TOOTHPASTE.    Please read over the following fact sheets that you were given. Coughing and Deep Breathing, MRSA Information and Surgical Site Infection Prevention

## 2018-03-11 ENCOUNTER — Other Ambulatory Visit: Payer: Self-pay

## 2018-03-11 ENCOUNTER — Encounter (HOSPITAL_COMMUNITY): Payer: Self-pay

## 2018-03-11 ENCOUNTER — Ambulatory Visit (HOSPITAL_COMMUNITY)
Admission: RE | Admit: 2018-03-11 | Discharge: 2018-03-11 | Disposition: A | Discharge status: Home / Self Care | Payer: BLUE CROSS/BLUE SHIELD | Source: Ambulatory Visit | Attending: Thoracic Surgery (Cardiothoracic Vascular Surgery) | Admitting: Thoracic Surgery (Cardiothoracic Vascular Surgery)

## 2018-03-11 ENCOUNTER — Encounter (HOSPITAL_COMMUNITY)
Admission: RE | Admit: 2018-03-11 | Discharge: 2018-03-11 | Disposition: A | Discharge status: Home / Self Care | Payer: BLUE CROSS/BLUE SHIELD | Source: Ambulatory Visit | Attending: Thoracic Surgery (Cardiothoracic Vascular Surgery) | Admitting: Thoracic Surgery (Cardiothoracic Vascular Surgery)

## 2018-03-11 DIAGNOSIS — Z01812 Encounter for preprocedural laboratory examination: Secondary | ICD-10-CM | POA: Insufficient documentation

## 2018-03-11 DIAGNOSIS — Z01818 Encounter for other preprocedural examination: Secondary | ICD-10-CM | POA: Insufficient documentation

## 2018-03-11 DIAGNOSIS — R911 Solitary pulmonary nodule: Secondary | ICD-10-CM

## 2018-03-11 DIAGNOSIS — Z0183 Encounter for blood typing: Secondary | ICD-10-CM | POA: Insufficient documentation

## 2018-03-11 DIAGNOSIS — I517 Cardiomegaly: Secondary | ICD-10-CM | POA: Diagnosis not present

## 2018-03-11 HISTORY — DX: Tremor, unspecified: R25.1

## 2018-03-11 HISTORY — DX: Major depressive disorder, single episode, unspecified: F32.9

## 2018-03-11 HISTORY — DX: Family history of other specified conditions: Z84.89

## 2018-03-11 HISTORY — DX: Depression, unspecified: F32.A

## 2018-03-11 HISTORY — DX: Emphysema, unspecified: J43.9

## 2018-03-11 HISTORY — DX: Anxiety disorder, unspecified: F41.9

## 2018-03-11 HISTORY — DX: Dyspnea, unspecified: R06.00

## 2018-03-11 LAB — BLOOD GAS, ARTERIAL
Acid-base deficit: 2 mmol/L (ref 0.0–2.0)
Bicarbonate: 21.8 mmol/L (ref 20.0–28.0)
Drawn by: 449541
FIO2: 21
O2 Saturation: 97.4 %
Patient temperature: 98.6
pCO2 arterial: 34.1 mmHg (ref 32.0–48.0)
pH, Arterial: 7.421 (ref 7.350–7.450)
pO2, Arterial: 94 mmHg (ref 83.0–108.0)

## 2018-03-11 LAB — URINALYSIS, ROUTINE W REFLEX MICROSCOPIC
Bacteria, UA: NONE SEEN
Bilirubin Urine: NEGATIVE
Glucose, UA: NEGATIVE mg/dL
Ketones, ur: NEGATIVE mg/dL
Leukocytes, UA: NEGATIVE
Nitrite: NEGATIVE
Protein, ur: NEGATIVE mg/dL
Specific Gravity, Urine: 1.012 (ref 1.005–1.030)
pH: 5 (ref 5.0–8.0)

## 2018-03-11 LAB — COMPREHENSIVE METABOLIC PANEL
ALT: 18 U/L (ref 14–54)
AST: 23 U/L (ref 15–41)
Albumin: 3.9 g/dL (ref 3.5–5.0)
Alkaline Phosphatase: 91 U/L (ref 38–126)
Anion gap: 11 (ref 5–15)
BUN: 9 mg/dL (ref 6–20)
CO2: 19 mmol/L — ABNORMAL LOW (ref 22–32)
Calcium: 9.5 mg/dL (ref 8.9–10.3)
Chloride: 107 mmol/L (ref 101–111)
Creatinine, Ser: 0.83 mg/dL (ref 0.44–1.00)
GFR calc Af Amer: >60 mL/min (ref >60)
GFR calc non Af Amer: >60 mL/min (ref >60)
Glucose, Bld: 102 mg/dL — ABNORMAL HIGH (ref 65–99)
Potassium: 4.3 mmol/L (ref 3.5–5.1)
Sodium: 137 mmol/L (ref 135–145)
Total Bilirubin: 0.9 mg/dL (ref 0.3–1.2)
Total Protein: 7.5 g/dL (ref 6.5–8.1)

## 2018-03-11 LAB — PULMONARY FUNCTION TEST
DL/VA % pred: 54 %
DL/VA: 2.7 ml/min/mmHg/L
DLCO cor % pred: 41 %
DLCO cor: 10.54 ml/min/mmHg
DLCO unc % pred: 42 %
DLCO unc: 10.99 ml/min/mmHg
FEF 25-75 Post: 0.94 L/sec
FEF 25-75 Pre: 0.4 L/sec
FEF2575-%Change-Post: 134 %
FEF2575-%Pred-Post: 41 %
FEF2575-%Pred-Pre: 17 %
FEV1-%Change-Post: 32 %
FEV1-%Pred-Post: 49 %
FEV1-%Pred-Pre: 37 %
FEV1-Post: 1.28 L
FEV1-Pre: 0.97 L
FEV1FVC-%Change-Post: 0 %
FEV1FVC-%Pred-Pre: 67 %
FEV6-%Change-Post: 30 %
FEV6-%Pred-Post: 72 %
FEV6-%Pred-Pre: 55 %
FEV6-Post: 2.33 L
FEV6-Pre: 1.79 L
FEV6FVC-%Change-Post: -1 %
FEV6FVC-%Pred-Post: 98 %
FEV6FVC-%Pred-Pre: 100 %
FVC-%Change-Post: 32 %
FVC-%Pred-Post: 73 %
FVC-%Pred-Pre: 55 %
FVC-Post: 2.47 L
FVC-Pre: 1.86 L
Post FEV1/FVC ratio: 52 %
Post FEV6/FVC ratio: 94 %
Pre FEV1/FVC ratio: 52 %
Pre FEV6/FVC Ratio: 96 %
RV % pred: 144 %
RV: 3.01 L
TLC % pred: 103 %
TLC: 5.37 L

## 2018-03-11 LAB — CBC
HCT: 45.7 % (ref 36.0–46.0)
Hemoglobin: 15.3 g/dL — ABNORMAL HIGH (ref 12.0–15.0)
MCH: 29.5 pg (ref 26.0–34.0)
MCHC: 33.5 g/dL (ref 30.0–36.0)
MCV: 88.2 fL (ref 78.0–100.0)
Platelets: 286 10*3/uL (ref 150–400)
RBC: 5.18 MIL/uL — ABNORMAL HIGH (ref 3.87–5.11)
RDW: 11.9 % (ref 11.5–15.5)
WBC: 7.7 10*3/uL (ref 4.0–10.5)

## 2018-03-11 LAB — PROTIME-INR
INR: 0.94
Prothrombin Time: 12.5 seconds (ref 11.4–15.2)

## 2018-03-11 LAB — ABO/RH: ABO/RH(D): O POS

## 2018-03-11 LAB — SURGICAL PCR SCREEN
MRSA, PCR: NEGATIVE
Staphylococcus aureus: NEGATIVE

## 2018-03-11 LAB — APTT: aPTT: 27 seconds (ref 24–36)

## 2018-03-11 LAB — TYPE AND SCREEN
ABO/RH(D): O POS
Antibody Screen: NEGATIVE

## 2018-03-11 MED ORDER — ALBUTEROL SULFATE (2.5 MG/3ML) 0.083% IN NEBU
2.5000 mg | INHALATION_SOLUTION | Freq: Once | RESPIRATORY_TRACT | Status: AC
  Administered 2018-03-11: 2.5 mg via RESPIRATORY_TRACT

## 2018-03-11 NOTE — Progress Notes (Signed)
PCP - Dr. Trena Platt Cardiologist - Patient denies  Chest x-ray - 03/11/18 EKG - 03/11/18 Stress Test - patient denies  ECHO - 11/2016 Cardiac Cath - patient denies  Sleep Study - patient denies   Blood Thinner Instructions: N/A Aspirin Instructions: N/A  Anesthesia review:   Patient denies shortness of breath, fever, cough and chest pain at PAT appointment   Patient verbalized understanding of instructions that were given to them at the PAT appointment. Patient was also instructed that they will need to review over the PAT instructions again at home before surgery.  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

## 2018-03-13 ENCOUNTER — Inpatient Hospital Stay (HOSPITAL_COMMUNITY)
Admission: RE | Admit: 2018-03-13 | Discharge: 2018-03-18 | DRG: 164 | Disposition: A | Discharge status: Home / Self Care | Payer: BLUE CROSS/BLUE SHIELD | Source: Ambulatory Visit | Attending: Thoracic Surgery (Cardiothoracic Vascular Surgery) | Admitting: Thoracic Surgery (Cardiothoracic Vascular Surgery)

## 2018-03-13 ENCOUNTER — Inpatient Hospital Stay (HOSPITAL_COMMUNITY): Payer: BLUE CROSS/BLUE SHIELD | Admitting: Certified Registered"

## 2018-03-13 ENCOUNTER — Encounter (HOSPITAL_COMMUNITY): Payer: Self-pay | Admitting: Urology

## 2018-03-13 ENCOUNTER — Encounter (HOSPITAL_COMMUNITY)
Admission: RE | Disposition: A | Discharge status: Home / Self Care | Payer: Self-pay | Source: Ambulatory Visit | Attending: Thoracic Surgery (Cardiothoracic Vascular Surgery)

## 2018-03-13 ENCOUNTER — Other Ambulatory Visit: Payer: Self-pay

## 2018-03-13 ENCOUNTER — Inpatient Hospital Stay (HOSPITAL_COMMUNITY): Payer: BLUE CROSS/BLUE SHIELD

## 2018-03-13 DIAGNOSIS — J9811 Atelectasis: Secondary | ICD-10-CM | POA: Diagnosis not present

## 2018-03-13 DIAGNOSIS — J939 Pneumothorax, unspecified: Secondary | ICD-10-CM

## 2018-03-13 DIAGNOSIS — R911 Solitary pulmonary nodule: Secondary | ICD-10-CM

## 2018-03-13 DIAGNOSIS — J982 Interstitial emphysema: Secondary | ICD-10-CM | POA: Diagnosis not present

## 2018-03-13 DIAGNOSIS — Z7951 Long term (current) use of inhaled steroids: Secondary | ICD-10-CM

## 2018-03-13 DIAGNOSIS — J9382 Other air leak: Secondary | ICD-10-CM | POA: Diagnosis not present

## 2018-03-13 DIAGNOSIS — F17210 Nicotine dependence, cigarettes, uncomplicated: Secondary | ICD-10-CM | POA: Diagnosis present

## 2018-03-13 DIAGNOSIS — K7460 Unspecified cirrhosis of liver: Secondary | ICD-10-CM | POA: Diagnosis present

## 2018-03-13 DIAGNOSIS — C3412 Malignant neoplasm of upper lobe, left bronchus or lung: Secondary | ICD-10-CM | POA: Diagnosis present

## 2018-03-13 DIAGNOSIS — Z79899 Other long term (current) drug therapy: Secondary | ICD-10-CM | POA: Diagnosis not present

## 2018-03-13 DIAGNOSIS — F419 Anxiety disorder, unspecified: Secondary | ICD-10-CM | POA: Diagnosis present

## 2018-03-13 DIAGNOSIS — R918 Other nonspecific abnormal finding of lung field: Secondary | ICD-10-CM

## 2018-03-13 DIAGNOSIS — Z09 Encounter for follow-up examination after completed treatment for conditions other than malignant neoplasm: Secondary | ICD-10-CM

## 2018-03-13 DIAGNOSIS — Z902 Acquired absence of lung [part of]: Secondary | ICD-10-CM

## 2018-03-13 DIAGNOSIS — Z4682 Encounter for fitting and adjustment of non-vascular catheter: Secondary | ICD-10-CM

## 2018-03-13 HISTORY — PX: VIDEO ASSISTED THORACOSCOPY (VATS)/WEDGE RESECTION: SHX6174

## 2018-03-13 HISTORY — PX: LOBECTOMY: SHX5089

## 2018-03-13 HISTORY — DX: Other nonspecific abnormal finding of lung field: R91.8

## 2018-03-13 SURGERY — VIDEO ASSISTED THORACOSCOPY (VATS)/WEDGE RESECTION
Anesthesia: General | Site: Chest | Laterality: Left

## 2018-03-13 MED ORDER — VANCOMYCIN HCL IN DEXTROSE 1-5 GM/200ML-% IV SOLN
INTRAVENOUS | Status: AC
  Filled 2018-03-13: qty 200

## 2018-03-13 MED ORDER — ROCURONIUM BROMIDE 10 MG/ML (PF) SYRINGE
PREFILLED_SYRINGE | INTRAVENOUS | Status: AC
  Filled 2018-03-13: qty 5

## 2018-03-13 MED ORDER — FENTANYL CITRATE (PF) 100 MCG/2ML IJ SOLN
INTRAMUSCULAR | Status: AC
  Filled 2018-03-13: qty 2

## 2018-03-13 MED ORDER — ALBUTEROL SULFATE (2.5 MG/3ML) 0.083% IN NEBU
2.5000 mg | INHALATION_SOLUTION | RESPIRATORY_TRACT | Status: DC
  Administered 2018-03-13: 2.5 mg via RESPIRATORY_TRACT
  Filled 2018-03-13: qty 3

## 2018-03-13 MED ORDER — SODIUM CHLORIDE 0.9 % IJ SOLN
INTRAMUSCULAR | Status: AC
  Filled 2018-03-13: qty 10

## 2018-03-13 MED ORDER — SODIUM CHLORIDE 0.9% FLUSH: 9.0000 mL | INTRAVENOUS | Status: DC | PRN

## 2018-03-13 MED ORDER — PHENYLEPHRINE 40 MCG/ML (10ML) SYRINGE FOR IV PUSH (FOR BLOOD PRESSURE SUPPORT)
PREFILLED_SYRINGE | INTRAVENOUS | Status: DC | PRN
  Administered 2018-03-13 (×3): 120 ug via INTRAVENOUS
  Administered 2018-03-13: 40 ug via INTRAVENOUS
  Administered 2018-03-13: 120 ug via INTRAVENOUS
  Administered 2018-03-13: 80 ug via INTRAVENOUS
  Administered 2018-03-13: 40 ug via INTRAVENOUS
  Administered 2018-03-13: 160 ug via INTRAVENOUS

## 2018-03-13 MED ORDER — FENTANYL CITRATE (PF) 100 MCG/2ML IJ SOLN
25.0000 ug | INTRAMUSCULAR | Status: DC | PRN
  Administered 2018-03-13 (×2): 50 ug via INTRAVENOUS

## 2018-03-13 MED ORDER — PHENYLEPHRINE 40 MCG/ML (10ML) SYRINGE FOR IV PUSH (FOR BLOOD PRESSURE SUPPORT)
PREFILLED_SYRINGE | INTRAVENOUS | Status: AC
  Filled 2018-03-13: qty 20

## 2018-03-13 MED ORDER — FENTANYL CITRATE (PF) 100 MCG/2ML IJ SOLN
INTRAMUSCULAR | Status: DC | PRN
  Administered 2018-03-13: 50 ug via INTRAVENOUS
  Administered 2018-03-13: 175 ug via INTRAVENOUS
  Administered 2018-03-13: 75 ug via INTRAVENOUS
  Administered 2018-03-13: 50 ug via INTRAVENOUS

## 2018-03-13 MED ORDER — ENOXAPARIN SODIUM 40 MG/0.4ML ~~LOC~~ SOLN
40.0000 mg | Freq: Every day | SUBCUTANEOUS | Status: DC
  Administered 2018-03-14 – 2018-03-18 (×5): 40 mg via SUBCUTANEOUS
  Filled 2018-03-13 (×5): qty 0.4

## 2018-03-13 MED ORDER — ACETAMINOPHEN 325 MG PO TABS: 325.0000 mg | ORAL_TABLET | ORAL | Status: DC | PRN

## 2018-03-13 MED ORDER — DEXAMETHASONE SODIUM PHOSPHATE 10 MG/ML IJ SOLN
INTRAMUSCULAR | Status: DC | PRN
  Administered 2018-03-13: 10 mg via INTRAVENOUS

## 2018-03-13 MED ORDER — PANTOPRAZOLE SODIUM 40 MG PO TBEC
40.0000 mg | DELAYED_RELEASE_TABLET | Freq: Every day | ORAL | Status: DC
  Administered 2018-03-13 – 2018-03-18 (×6): 40 mg via ORAL
  Filled 2018-03-13 (×6): qty 1

## 2018-03-13 MED ORDER — SUGAMMADEX SODIUM 200 MG/2ML IV SOLN
INTRAVENOUS | Status: AC
  Filled 2018-03-13: qty 2

## 2018-03-13 MED ORDER — FENTANYL CITRATE (PF) 250 MCG/5ML IJ SOLN
INTRAMUSCULAR | Status: AC
  Filled 2018-03-13: qty 5

## 2018-03-13 MED ORDER — VANCOMYCIN HCL IN DEXTROSE 1-5 GM/200ML-% IV SOLN
1000.0000 mg | INTRAVENOUS | Status: AC
  Administered 2018-03-13: 1000 mg via INTRAVENOUS

## 2018-03-13 MED ORDER — MIDAZOLAM HCL 2 MG/2ML IJ SOLN
INTRAMUSCULAR | Status: AC
  Filled 2018-03-13: qty 2

## 2018-03-13 MED ORDER — OXYCODONE HCL 5 MG PO TABS: 5.0000 mg | ORAL_TABLET | Freq: Once | ORAL | Status: DC | PRN

## 2018-03-13 MED ORDER — ORAL CARE MOUTH RINSE
15.0000 mL | Freq: Two times a day (BID) | OROMUCOSAL | Status: DC
  Administered 2018-03-13 – 2018-03-16 (×4): 15 mL via OROMUCOSAL

## 2018-03-13 MED ORDER — GLYCOPYRROLATE PF 0.2 MG/ML IJ SOSY
PREFILLED_SYRINGE | INTRAMUSCULAR | Status: DC | PRN
Start: 2018-03-13 — End: 2018-03-13
  Administered 2018-03-13: .2 mg via INTRAVENOUS

## 2018-03-13 MED ORDER — PHENYLEPHRINE HCL 10 MG/ML IJ SOLN
INTRAMUSCULAR | Status: DC | PRN
  Administered 2018-03-13: 25 ug/min via INTRAVENOUS

## 2018-03-13 MED ORDER — BUPIVACAINE LIPOSOME 1.3 % IJ SUSP
20.0000 mL | INTRAMUSCULAR | Status: AC
  Administered 2018-03-13: 52 mL
  Filled 2018-03-13: qty 20

## 2018-03-13 MED ORDER — SODIUM CHLORIDE 0.9 % IJ SOLN
INTRAMUSCULAR | Status: DC | PRN
  Administered 2018-03-13: 50 mL via INTRAVENOUS

## 2018-03-13 MED ORDER — PROPRANOLOL HCL 20 MG PO TABS
20.0000 mg | ORAL_TABLET | Freq: Every evening | ORAL | Status: DC
  Administered 2018-03-13 – 2018-03-17 (×5): 20 mg via ORAL
  Filled 2018-03-13 (×6): qty 1

## 2018-03-13 MED ORDER — PAROXETINE HCL 20 MG PO TABS
20.0000 mg | ORAL_TABLET | Freq: Every evening | ORAL | Status: DC
  Administered 2018-03-13 – 2018-03-17 (×5): 20 mg via ORAL
  Filled 2018-03-13 (×6): qty 1

## 2018-03-13 MED ORDER — ONDANSETRON HCL 4 MG/2ML IJ SOLN: 4.0000 mg | Freq: Four times a day (QID) | INTRAMUSCULAR | Status: DC | PRN

## 2018-03-13 MED ORDER — ONDANSETRON HCL 4 MG/2ML IJ SOLN
INTRAMUSCULAR | Status: AC
  Filled 2018-03-13: qty 2

## 2018-03-13 MED ORDER — PROPOFOL 10 MG/ML IV BOLUS
INTRAVENOUS | Status: AC
  Filled 2018-03-13: qty 20

## 2018-03-13 MED ORDER — ALBUTEROL SULFATE (2.5 MG/3ML) 0.083% IN NEBU
2.5000 mg | INHALATION_SOLUTION | Freq: Three times a day (TID) | RESPIRATORY_TRACT | Status: DC
  Administered 2018-03-13 – 2018-03-17 (×13): 2.5 mg via RESPIRATORY_TRACT
  Filled 2018-03-13 (×13): qty 3

## 2018-03-13 MED ORDER — DEXAMETHASONE SODIUM PHOSPHATE 10 MG/ML IJ SOLN
INTRAMUSCULAR | Status: AC
  Filled 2018-03-13: qty 1

## 2018-03-13 MED ORDER — TRAMADOL HCL 50 MG PO TABS
50.0000 mg | ORAL_TABLET | Freq: Four times a day (QID) | ORAL | Status: DC | PRN
  Administered 2018-03-14: 100 mg via ORAL
  Administered 2018-03-15: 50 mg via ORAL
  Administered 2018-03-15: 100 mg via ORAL
  Filled 2018-03-13 (×3): qty 2

## 2018-03-13 MED ORDER — FLUTICASONE PROPIONATE 50 MCG/ACT NA SUSP
1.0000 | Freq: Two times a day (BID) | NASAL | Status: DC
  Administered 2018-03-13 – 2018-03-16 (×5): 1 via NASAL
  Filled 2018-03-13: qty 16

## 2018-03-13 MED ORDER — ACETAMINOPHEN 500 MG PO TABS
1000.0000 mg | ORAL_TABLET | Freq: Four times a day (QID) | ORAL | Status: AC
  Administered 2018-03-13 – 2018-03-18 (×17): 1000 mg via ORAL
  Filled 2018-03-13 (×18): qty 2

## 2018-03-13 MED ORDER — LACTATED RINGERS IV SOLN
INTRAVENOUS | Status: DC
  Administered 2018-03-13: 06:00:00 via INTRAVENOUS

## 2018-03-13 MED ORDER — VANCOMYCIN HCL IN DEXTROSE 1-5 GM/200ML-% IV SOLN
1000.0000 mg | Freq: Two times a day (BID) | INTRAVENOUS | Status: AC
  Administered 2018-03-13: 1000 mg via INTRAVENOUS
  Filled 2018-03-13: qty 200

## 2018-03-13 MED ORDER — MIDAZOLAM HCL 2 MG/2ML IJ SOLN
INTRAMUSCULAR | Status: DC | PRN
  Administered 2018-03-13: 2 mg via INTRAVENOUS

## 2018-03-13 MED ORDER — NALOXONE HCL 0.4 MG/ML IJ SOLN: 0.4000 mg | INTRAMUSCULAR | Status: DC | PRN

## 2018-03-13 MED ORDER — LUNG SURGERY BOOK
Freq: Once | Status: AC
  Administered 2018-03-13: 1
  Filled 2018-03-13: qty 1

## 2018-03-13 MED ORDER — DIPHENHYDRAMINE HCL 12.5 MG/5ML PO ELIX
12.5000 mg | ORAL_SOLUTION | Freq: Four times a day (QID) | ORAL | Status: DC | PRN
  Filled 2018-03-13: qty 5

## 2018-03-13 MED ORDER — FENTANYL 40 MCG/ML IV SOLN
INTRAVENOUS | Status: DC
  Administered 2018-03-13: 75 ug via INTRAVENOUS
  Administered 2018-03-13: 240 ug via INTRAVENOUS
  Administered 2018-03-13: 1000 ug via INTRAVENOUS
  Administered 2018-03-13: 30 ug via INTRAVENOUS
  Administered 2018-03-13: 150 ug via INTRAVENOUS
  Administered 2018-03-14 (×2): 60 ug via INTRAVENOUS
  Administered 2018-03-14: 1000 ug via INTRAVENOUS
  Administered 2018-03-14: 0 ug via INTRAVENOUS
  Administered 2018-03-14: 90 ug via INTRAVENOUS
  Administered 2018-03-14: 135 ug via INTRAVENOUS
  Administered 2018-03-15: 60 ug via INTRAVENOUS
  Filled 2018-03-13 (×2): qty 25

## 2018-03-13 MED ORDER — ACETAMINOPHEN 160 MG/5ML PO SOLN: 325.0000 mg | ORAL | Status: DC | PRN

## 2018-03-13 MED ORDER — OXYCODONE HCL 5 MG PO TABS
5.0000 mg | ORAL_TABLET | ORAL | Status: DC | PRN
  Administered 2018-03-13 – 2018-03-18 (×18): 10 mg via ORAL
  Filled 2018-03-13 (×18): qty 2

## 2018-03-13 MED ORDER — SENNOSIDES-DOCUSATE SODIUM 8.6-50 MG PO TABS
1.0000 | ORAL_TABLET | Freq: Every day | ORAL | Status: DC
  Administered 2018-03-13 – 2018-03-17 (×5): 1 via ORAL
  Filled 2018-03-13 (×5): qty 1

## 2018-03-13 MED ORDER — OXYCODONE HCL 5 MG/5ML PO SOLN: 5.0000 mg | Freq: Once | ORAL | Status: DC | PRN

## 2018-03-13 MED ORDER — KETOROLAC TROMETHAMINE 30 MG/ML IJ SOLN
30.0000 mg | Freq: Once | INTRAMUSCULAR | Status: AC
  Administered 2018-03-13: 30 mg via INTRAVENOUS
  Filled 2018-03-13: qty 1

## 2018-03-13 MED ORDER — POTASSIUM CHLORIDE IN NACL 20-0.45 MEQ/L-% IV SOLN
INTRAVENOUS | Status: DC
  Administered 2018-03-13 – 2018-03-14 (×3): via INTRAVENOUS
  Filled 2018-03-13 (×3): qty 1000

## 2018-03-13 MED ORDER — BISACODYL 5 MG PO TBEC
10.0000 mg | DELAYED_RELEASE_TABLET | Freq: Every day | ORAL | Status: DC
  Administered 2018-03-13 – 2018-03-17 (×5): 10 mg via ORAL
  Filled 2018-03-13 (×6): qty 2

## 2018-03-13 MED ORDER — DIPHENHYDRAMINE HCL 50 MG/ML IJ SOLN: 12.5000 mg | Freq: Four times a day (QID) | INTRAMUSCULAR | Status: DC | PRN

## 2018-03-13 MED ORDER — HEMOSTATIC AGENTS (NO CHARGE) OPTIME
TOPICAL | Status: DC | PRN
  Administered 2018-03-13: 1 via TOPICAL

## 2018-03-13 MED ORDER — LIDOCAINE 2% (20 MG/ML) 5 ML SYRINGE
INTRAMUSCULAR | Status: DC | PRN
  Administered 2018-03-13: 60 mg via INTRAVENOUS

## 2018-03-13 MED ORDER — PHENYLEPHRINE HCL 10 MG/ML IJ SOLN: INTRAMUSCULAR | Status: DC | PRN

## 2018-03-13 MED ORDER — LACTATED RINGERS IV SOLN
INTRAVENOUS | Status: DC | PRN
  Administered 2018-03-13 (×2): via INTRAVENOUS

## 2018-03-13 MED ORDER — PROPOFOL 10 MG/ML IV BOLUS
INTRAVENOUS | Status: DC | PRN
  Administered 2018-03-13 (×2): 50 mg via INTRAVENOUS
  Administered 2018-03-13: 100 mg via INTRAVENOUS
  Administered 2018-03-13: 50 mg via INTRAVENOUS

## 2018-03-13 MED ORDER — SUGAMMADEX SODIUM 200 MG/2ML IV SOLN
INTRAVENOUS | Status: DC | PRN
Start: 2018-03-13 — End: 2018-03-13
  Administered 2018-03-13: 140 mg via INTRAVENOUS

## 2018-03-13 MED ORDER — ZOLPIDEM TARTRATE 5 MG PO TABS
5.0000 mg | ORAL_TABLET | Freq: Every evening | ORAL | Status: DC | PRN
  Administered 2018-03-13 – 2018-03-16 (×3): 5 mg via ORAL
  Filled 2018-03-13 (×3): qty 1

## 2018-03-13 MED ORDER — MOMETASONE FURO-FORMOTEROL FUM 200-5 MCG/ACT IN AERO
2.0000 | INHALATION_SPRAY | Freq: Two times a day (BID) | RESPIRATORY_TRACT | Status: DC
  Administered 2018-03-13 – 2018-03-18 (×10): 2 via RESPIRATORY_TRACT
  Filled 2018-03-13: qty 8.8

## 2018-03-13 MED ORDER — POTASSIUM CHLORIDE 10 MEQ/50ML IV SOLN: 10.0000 meq | Freq: Every day | INTRAVENOUS | Status: DC | PRN

## 2018-03-13 MED ORDER — EPHEDRINE SULFATE 50 MG/ML IJ SOLN
INTRAMUSCULAR | Status: AC
  Filled 2018-03-13: qty 1

## 2018-03-13 MED ORDER — ONDANSETRON HCL 4 MG/2ML IJ SOLN
INTRAMUSCULAR | Status: DC | PRN
  Administered 2018-03-13: 4 mg via INTRAVENOUS

## 2018-03-13 MED ORDER — ROCURONIUM BROMIDE 10 MG/ML (PF) SYRINGE
PREFILLED_SYRINGE | INTRAVENOUS | Status: DC | PRN
  Administered 2018-03-13: 50 mg via INTRAVENOUS
  Administered 2018-03-13 (×2): 30 mg via INTRAVENOUS
  Administered 2018-03-13: 20 mg via INTRAVENOUS

## 2018-03-13 MED ORDER — ACETAMINOPHEN 160 MG/5ML PO SOLN: 1000.0000 mg | Freq: Four times a day (QID) | ORAL | Status: AC

## 2018-03-13 MED ORDER — 0.9 % SODIUM CHLORIDE (POUR BTL) OPTIME
TOPICAL | Status: DC | PRN
  Administered 2018-03-13: 2000 mL

## 2018-03-13 MED ORDER — LIDOCAINE 2% (20 MG/ML) 5 ML SYRINGE
INTRAMUSCULAR | Status: AC
Start: 2018-03-13 — End: ?
  Filled 2018-03-13: qty 5

## 2018-03-13 SURGICAL SUPPLY — 99 items
CANISTER SUCT 3000ML PPV (MISCELLANEOUS) ×2 IMPLANT
CATH THORACIC 28FR (CATHETERS) ×2 IMPLANT
CATH THORACIC 36FR (CATHETERS) IMPLANT
CATH THORACIC 36FR RT ANG (CATHETERS) IMPLANT
CLIP VESOCCLUDE MED 6/CT (CLIP) ×2 IMPLANT
CONN ST 1/4X3/8  BEN (MISCELLANEOUS)
CONN ST 1/4X3/8 BEN (MISCELLANEOUS) IMPLANT
CONN Y 3/8X3/8X3/8  BEN (MISCELLANEOUS)
CONN Y 3/8X3/8X3/8 BEN (MISCELLANEOUS) IMPLANT
CONT SPEC 4OZ CLIKSEAL STRL BL (MISCELLANEOUS) ×24 IMPLANT
COVER SURGICAL LIGHT HANDLE (MISCELLANEOUS) ×2 IMPLANT
DERMABOND ADVANCED (GAUZE/BANDAGES/DRESSINGS) ×1
DERMABOND ADVANCED .7 DNX12 (GAUZE/BANDAGES/DRESSINGS) ×1 IMPLANT
DRAIN CHANNEL 28F RND 3/8 FF (WOUND CARE) IMPLANT
DRAIN CHANNEL 32F RND 10.7 FF (WOUND CARE) IMPLANT
DRAPE LAPAROSCOPIC ABDOMINAL (DRAPES) ×2 IMPLANT
DRAPE SLUSH/WARMER DISC (DRAPES) ×2 IMPLANT
DRAPE WARM FLUID 44X44 (DRAPE) IMPLANT
ELECT BLADE 6.5 EXT (BLADE) ×2 IMPLANT
ELECT REM PT RETURN 9FT ADLT (ELECTROSURGICAL) ×2
ELECTRODE REM PT RTRN 9FT ADLT (ELECTROSURGICAL) ×1 IMPLANT
FELT TEFLON 1X6 (MISCELLANEOUS) ×2 IMPLANT
GAUZE SPONGE 4X4 12PLY STRL (GAUZE/BANDAGES/DRESSINGS) IMPLANT
GAUZE SPONGE 4X4 12PLY STRL LF (GAUZE/BANDAGES/DRESSINGS) ×2 IMPLANT
GLOVE BIO SURGEON STRL SZ 6 (GLOVE) ×4 IMPLANT
GLOVE BIO SURGEON STRL SZ 6.5 (GLOVE) ×4 IMPLANT
GLOVE BIO SURGEON STRL SZ7.5 (GLOVE) ×4 IMPLANT
GLOVE BIOGEL PI IND STRL 6 (GLOVE) ×2 IMPLANT
GLOVE BIOGEL PI IND STRL 6.5 (GLOVE) ×5 IMPLANT
GLOVE BIOGEL PI INDICATOR 6 (GLOVE) ×2
GLOVE BIOGEL PI INDICATOR 6.5 (GLOVE) ×5
GLOVE SURG SIGNA 7.5 PF LTX (GLOVE) ×4 IMPLANT
GOWN STRL REUS W/ TWL LRG LVL3 (GOWN DISPOSABLE) ×4 IMPLANT
GOWN STRL REUS W/ TWL XL LVL3 (GOWN DISPOSABLE) ×2 IMPLANT
GOWN STRL REUS W/TWL LRG LVL3 (GOWN DISPOSABLE) ×4
GOWN STRL REUS W/TWL XL LVL3 (GOWN DISPOSABLE) ×2
HEMOSTAT SURGICEL 2X14 (HEMOSTASIS) ×2 IMPLANT
KIT BASIN OR (CUSTOM PROCEDURE TRAY) ×2 IMPLANT
KIT SUCTION CATH 14FR (SUCTIONS) ×2 IMPLANT
KIT TURNOVER KIT B (KITS) ×2 IMPLANT
NEEDLE HYPO 25GX1X1/2 BEV (NEEDLE) ×4 IMPLANT
NEEDLE SPNL 18GX3.5 QUINCKE PK (NEEDLE) IMPLANT
NEEDLE SPNL 22GX3.5 QUINCKE BK (NEEDLE) ×2 IMPLANT
NS IRRIG 1000ML POUR BTL (IV SOLUTION) ×4 IMPLANT
PACK CHEST (CUSTOM PROCEDURE TRAY) ×2 IMPLANT
PAD ARMBOARD 7.5X6 YLW CONV (MISCELLANEOUS) ×4 IMPLANT
POUCH ENDO CATCH II 15MM (MISCELLANEOUS) IMPLANT
POUCH SPECIMEN RETRIEVAL 10MM (ENDOMECHANICALS) ×4 IMPLANT
PROGEL SPRAY TIP 11IN (MISCELLANEOUS) ×2
RELOAD STAPLER GOLD 60MM (STAPLE) ×14 IMPLANT
RELOAD STAPLER GREEN 60MM (STAPLE) ×1 IMPLANT
SCISSORS ENDO CVD 5DCS (MISCELLANEOUS) IMPLANT
SEALANT PROGEL (MISCELLANEOUS) ×2 IMPLANT
SEALANT SURG COSEAL 4ML (VASCULAR PRODUCTS) IMPLANT
SEALANT SURG COSEAL 8ML (VASCULAR PRODUCTS) IMPLANT
SHEARS HARMONIC HDI 20CM (ELECTROSURGICAL) ×4 IMPLANT
SOLUTION ANTI FOG 6CC (MISCELLANEOUS) ×2 IMPLANT
SPECIMEN JAR MEDIUM (MISCELLANEOUS) IMPLANT
SPONGE INTESTINAL PEANUT (DISPOSABLE) ×6 IMPLANT
SPONGE TONSIL 1 RF SGL (DISPOSABLE) ×4 IMPLANT
STAPLE ECHEON FLEX 60 POW ENDO (STAPLE) ×2 IMPLANT
STAPLE RELOAD 2.5MM WHITE (STAPLE) ×2 IMPLANT
STAPLER RELOAD GOLD 60MM (STAPLE) ×28
STAPLER RELOAD GREEN 60MM (STAPLE) ×2
STAPLER VASCULAR ECHELON 35 (CUTTER) ×4 IMPLANT
SUT PROLENE 4 0 RB 1 (SUTURE) ×2
SUT PROLENE 4-0 RB1 .5 CRCL 36 (SUTURE) ×2 IMPLANT
SUT SILK  1 MH (SUTURE) ×2
SUT SILK 1 MH (SUTURE) ×2 IMPLANT
SUT SILK 1 TIES 10X30 (SUTURE) IMPLANT
SUT SILK 2 0 SH (SUTURE) IMPLANT
SUT SILK 2 0SH CR/8 30 (SUTURE) IMPLANT
SUT SILK 3 0 SH 30 (SUTURE) IMPLANT
SUT SILK 3 0SH CR/8 30 (SUTURE) IMPLANT
SUT VIC AB 0 CTX 27 (SUTURE) IMPLANT
SUT VIC AB 1 CTX 27 (SUTURE) ×4 IMPLANT
SUT VIC AB 2-0 CT1 27 (SUTURE) ×2
SUT VIC AB 2-0 CT1 TAPERPNT 27 (SUTURE) ×2 IMPLANT
SUT VIC AB 2-0 CTX 36 (SUTURE) ×2 IMPLANT
SUT VIC AB 2-0 SH 27 (SUTURE) ×1
SUT VIC AB 2-0 SH 27XBRD (SUTURE) ×1 IMPLANT
SUT VIC AB 3-0 MH 27 (SUTURE) IMPLANT
SUT VIC AB 3-0 SH 27 (SUTURE)
SUT VIC AB 3-0 SH 27X BRD (SUTURE) IMPLANT
SUT VIC AB 3-0 X1 27 (SUTURE) ×2 IMPLANT
SUT VICRYL 0 UR6 27IN ABS (SUTURE) ×2 IMPLANT
SUT VICRYL 2 TP 1 (SUTURE) IMPLANT
SYR 30ML LL (SYRINGE) ×2 IMPLANT
SYSTEM SAHARA CHEST DRAIN ATS (WOUND CARE) ×2 IMPLANT
TAPE CLOTH SURG 4X10 WHT LF (GAUZE/BANDAGES/DRESSINGS) ×2 IMPLANT
TIP APPLICATOR SPRAY EXTEND 16 (VASCULAR PRODUCTS) IMPLANT
TIP SPRAY PROGEL 11IN (MISCELLANEOUS) ×1 IMPLANT
TOWEL GREEN STERILE (TOWEL DISPOSABLE) ×4 IMPLANT
TOWEL GREEN STERILE FF (TOWEL DISPOSABLE) IMPLANT
TRAY FOLEY MTR SLVR 16FR STAT (SET/KITS/TRAYS/PACK) ×2 IMPLANT
TROCAR BLADELESS 5M (ENDOMECHANICALS) ×2 IMPLANT
TROCAR XCEL BLADELESS 5X75MML (TROCAR) ×2 IMPLANT
TROCAR XCEL NON-BLD 5MMX100MML (ENDOMECHANICALS) IMPLANT
WATER STERILE IRR 1000ML POUR (IV SOLUTION) ×4 IMPLANT

## 2018-03-13 NOTE — Anesthesia Procedure Notes (Signed)
Arterial Line Insertion Start/End6/13/2019 6:55 AM, 03/13/2018 7:00 AM Performed by: Barrington Ellison, CRNA, CRNA  Patient location: Pre-op. Lidocaine 1% used for infiltration Left, radial was placed Catheter size: 20 G Hand hygiene performed  and maximum sterile barriers used  Allen's test indicative of satisfactory collateral circulation Attempts: 1 Procedure performed without using ultrasound guided technique. Following insertion, dressing applied and Biopatch. Post procedure assessment: normal  Patient tolerated the procedure well with no immediate complications.

## 2018-03-13 NOTE — Transfer of Care (Signed)
Immediate Anesthesia Transfer of Care Note  Patient: [name]  Procedure(s) Performed: VIDEO ASSISTED THORACOSCOPY (VATS)/WEDGE RESECTION (Left Chest) Lingula section of left upper lobe lobectomy (Left Chest)  Patient Location: PACU  Anesthesia Type:General  Level of Consciousness: awake and oriented  Airway & Oxygen Therapy: Patient Spontanous Breathing and Patient connected to nasal cannula oxygen  Post-op Assessment: Report given to RN  Post vital signs: Reviewed and stable  Last Vitals:  Vitals Value Taken Time  BP 115/63 03/13/2018 11:57 AM  Temp    Pulse 75 03/13/2018 11:59 AM  Resp 13 03/13/2018 11:59 AM  SpO2 97 % 03/13/2018 11:59 AM  Vitals shown include unvalidated device data.  Last Pain:  Vitals:   03/13/18 0617  TempSrc:   PainSc: 0-No pain      Patients Stated Pain Goal: 0 (18/40/37 5436)  Complications: No apparent anesthesia complications

## 2018-03-13 NOTE — Brief Op Note (Addendum)
03/13/2018  11:33 AM  PATIENT:  [name]  62 y.o. 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)  PROCEDURE:  Procedure(s): VIDEO ASSISTED THORACOSCOPY (VATS)/WEDGE RESECTION (Left) Lingula section of left upper lobe lobectomy (Left)  Mediastinal lymph node sampling  SURGEON:  Surgeon(s) and Role:    * Melrose Nakayama, MD - Primary  PHYSICIAN ASSISTANT: WAYNE GOLD PA-C  ANESTHESIA:   general  EBL:  120 mL   BLOOD ADMINISTERED:none  DRAINS: 1 28 F Chest Tube(s) in the LEFT HEMITHORAX   LOCAL MEDICATIONS USED:  BUPIVICAINE   SPECIMEN:  LEFT UPPER LOBE NODULE, LINGULAR SEGMENT, AND LYMPH NODES  DISPOSITION OF SPECIMEN:  PATHOLOGY  COUNTS:  YES  TOURNIQUET:  * No tourniquets in log *  DICTATION: .Other Dictation: Dictation Number PENDING  PLAN OF CARE: Admit to inpatient   PATIENT DISPOSITION:  PACU - hemodynamically stable.   Delay start of Pharmacological VTE agent (>24hrs) due to surgical blood loss or risk of bleeding: yes  COMPLICATIONS: NO KNOWN

## 2018-03-13 NOTE — Interval H&P Note (Signed)
History and Physical Interval Note: PFTs FEV1 0.97 improves with bronchodilators. DLCO 40%- will do lingular segmentectomy rather than lobectomy to preserve pulmonary function 03/13/2018 7:44 AM  [name]  has presented today for surgery, with the diagnosis of LUL NODULE  The various methods of treatment have been discussed with the patient and family. After consideration of risks, benefits and other options for treatment, the patient has consented to  Procedure(s): VIDEO ASSISTED THORACOSCOPY (VATS)/WEDGE RESECTION (Left) LOBECTOMY (Left) as a surgical intervention .  The patient's history has been reviewed, patient examined, no change in status, stable for surgery.  I have reviewed the patient's chart and labs.  Questions were answered to the patient's satisfaction.     Melrose Nakayama

## 2018-03-13 NOTE — Anesthesia Procedure Notes (Signed)
Procedure Name: Intubation Date/Time: 03/13/2018 8:48 AM Performed by: Rica Koyanagi, MD Pre-anesthesia Checklist: Patient identified, Emergency Drugs available, Suction available and Patient being monitored Patient Re-evaluated:Patient Re-evaluated prior to induction Oxygen Delivery Method: Circle System Utilized Preoxygenation: Pre-oxygenation with 100% oxygen Induction Type: IV induction Ventilation: Mask ventilation without difficulty and Oral airway inserted - appropriate to patient size Laryngoscope Size: Sabra Heck and 2 Grade View: Grade I Tube type: Oral Endobronchial tube: Bronchial Blocker placed under direct vision, EBT position confirmed by auscultation and EBT position confirmed by fiberoptic bronchoscope Tube size: 7.5 mm Number of attempts: 5 or more Airway Equipment and Method: Stylet and Oral airway Placement Confirmation: ETT inserted through vocal cords under direct vision,  positive ETCO2 and breath sounds checked- equal and bilateral Secured at: 24 cm Tube secured with: Tape Dental Injury: Teeth and Oropharynx as per pre-operative assessment  Difficulty Due To: Difficulty was unanticipated Comments: Grade 1 view for intubation, unable to pass a double lumen into airway. 2 attempts by CRNA, multiple attempts by MDA, attempted with #37 and #35 DLT's, unable to advance tube past the vocal cords. Able to easily mask ventilate patient with oral airway in place. Converted to ETT with bronchial dilator. 7.5 ETT inserted easily with Grade 1 view, Miller 2 blade

## 2018-03-13 NOTE — Anesthesia Postprocedure Evaluation (Signed)
Anesthesia Post Note  Patient: Tommie Raymond  Procedure(s) Performed: VIDEO ASSISTED THORACOSCOPY (VATS)/WEDGE RESECTION (Left Chest) Lingula section of left upper lobe lobectomy (Left Chest)     Patient location during evaluation: PACU Anesthesia Type: General Level of consciousness: awake and sedated Pain management: pain level controlled Vital Signs Assessment: post-procedure vital signs reviewed and stable Respiratory status: spontaneous breathing, nonlabored ventilation, respiratory function stable and patient connected to nasal cannula oxygen Cardiovascular status: blood pressure returned to baseline and stable Postop Assessment: no apparent nausea or vomiting Anesthetic complications: no    Last Vitals:  Vitals:   03/13/18 1350 03/13/18 1402  BP: 104/85   Pulse: 87   Resp: 20   Temp: 36.8 C   SpO2: 91% 94%    Last Pain:  Vitals:   03/13/18 1402  TempSrc:   PainSc: 10-Worst pain ever                 Keilyn Nadal,JAMES TERRILL

## 2018-03-13 NOTE — Plan of Care (Signed)
Pt. Had a VATS with wedge resection, oxygen on 2l/min, pain issues on full dose fentanyl PCA,

## 2018-03-13 NOTE — Anesthesia Preprocedure Evaluation (Addendum)
Anesthesia Evaluation  Patient identified by MRN, date of birth, ID band Patient awake    Reviewed: Allergy & Precautions, NPO status , Patient's Chart, lab work & pertinent test results, reviewed documented beta blocker date and time   History of Anesthesia Complications Negative for: history of anesthetic complications  Airway Mallampati: II  TM Distance: >3 FB Neck ROM: Full    Dental  (+) Teeth Intact, Dental Advisory Given   Pulmonary shortness of breath, COPD,  COPD inhaler, Current Smoker,    breath sounds clear to auscultation       Cardiovascular negative cardio ROS   Rhythm:Regular     Neuro/Psych  Headaches,    GI/Hepatic GERD  Controlled,(+) Hepatitis -, [name]    Endo/Other  negative endocrine ROS  Renal/GU negative Renal ROS     Musculoskeletal  (+) Arthritis ,   Abdominal   Peds  Hematology negative hematology ROS (+)   Anesthesia Other Findings   Reproductive/Obstetrics                            Anesthesia Physical Anesthesia Plan  ASA: II  Anesthesia Plan: General   Post-op Pain Management:    Induction: Intravenous  PONV Risk Score and Plan: 2 and Ondansetron and Dexamethasone  Airway Management Planned: Double Lumen EBT  Additional Equipment: Arterial line  Intra-op Plan:   Post-operative Plan: Extubation in OR  Informed Consent: I have reviewed the patients History and Physical, chart, labs and discussed the procedure including the risks, benefits and alternatives for the proposed anesthesia with the patient or authorized representative who has indicated his/her understanding and acceptance.   Dental advisory given  Plan Discussed with: CRNA and Surgeon  Anesthesia Plan Comments:         Anesthesia Quick Evaluation

## 2018-03-14 ENCOUNTER — Inpatient Hospital Stay (HOSPITAL_COMMUNITY): Payer: BLUE CROSS/BLUE SHIELD

## 2018-03-14 ENCOUNTER — Encounter (HOSPITAL_COMMUNITY): Payer: Self-pay | Admitting: Thoracic Surgery (Cardiothoracic Vascular Surgery)

## 2018-03-14 ENCOUNTER — Encounter: Payer: Self-pay | Admitting: Internal Medicine

## 2018-03-14 LAB — BASIC METABOLIC PANEL
Anion gap: 6 (ref 5–15)
BUN: 15 mg/dL (ref 6–20)
CO2: 19 mmol/L — ABNORMAL LOW (ref 22–32)
Calcium: 7.8 mg/dL — ABNORMAL LOW (ref 8.9–10.3)
Chloride: 109 mmol/L (ref 101–111)
Creatinine, Ser: 0.85 mg/dL (ref 0.44–1.00)
GFR calc Af Amer: >60 mL/min (ref >60)
GFR calc non Af Amer: >60 mL/min (ref >60)
Glucose, Bld: 125 mg/dL — ABNORMAL HIGH (ref 65–99)
Potassium: 4.4 mmol/L (ref 3.5–5.1)
Sodium: 134 mmol/L — ABNORMAL LOW (ref 135–145)

## 2018-03-14 LAB — CBC
HCT: 37.5 % (ref 36.0–46.0)
Hemoglobin: 12.3 g/dL (ref 12.0–15.0)
MCH: 29.6 pg (ref 26.0–34.0)
MCHC: 32.8 g/dL (ref 30.0–36.0)
MCV: 90.4 fL (ref 78.0–100.0)
Platelets: 255 10*3/uL (ref 150–400)
RBC: 4.15 MIL/uL (ref 3.87–5.11)
RDW: 12 % (ref 11.5–15.5)
WBC: 13.2 10*3/uL — ABNORMAL HIGH (ref 4.0–10.5)

## 2018-03-14 LAB — BLOOD GAS, ARTERIAL
Acid-base deficit: 4.9 mmol/L — ABNORMAL HIGH (ref 0.0–2.0)
Bicarbonate: 20.2 mmol/L (ref 20.0–28.0)
Drawn by: 23703
FIO2: 28
O2 Content: 2 L/min
O2 Saturation: 97.1 %
Patient temperature: 98.6
pCO2 arterial: 41.1 mmHg (ref 32.0–48.0)
pH, Arterial: 7.314 — ABNORMAL LOW (ref 7.350–7.450)
pO2, Arterial: 98.3 mmHg (ref 83.0–108.0)

## 2018-03-14 MED ORDER — GUAIFENESIN ER 600 MG PO TB12
600.0000 mg | ORAL_TABLET | Freq: Two times a day (BID) | ORAL | Status: DC
  Administered 2018-03-14 – 2018-03-18 (×9): 600 mg via ORAL
  Filled 2018-03-14 (×9): qty 1

## 2018-03-14 MED ORDER — BIOTENE DRY MOUTH MT LIQD: 15.0000 mL | OROMUCOSAL | Status: DC | PRN

## 2018-03-14 NOTE — Progress Notes (Addendum)
      [address]       [place] [address]             [phone]       1 Day Post-Op Procedure(s) (LRB): VIDEO ASSISTED THORACOSCOPY (VATS)/WEDGE RESECTION (Left) Lingula section of left upper lobe lobectomy (Left)  Subjective: Patient has cough, dry mouth.  Objective: Vital signs in last 24 hours: Temp:  [97 F (36.1 C)-98.3 F (36.8 C)] 98 F (36.7 C) (06/14 0333) Pulse Rate:  [72-89] 75 (06/14 0333) Cardiac Rhythm: Normal sinus rhythm (06/14 0705) Resp:  [11-20] 18 (06/14 0343) BP: (91-133)/(47-86) 133/77 (06/14 0333) SpO2:  [89 %-100 %] 96 % (06/14 0333) Arterial Line BP: (112-140)/(51-66) 114/66 (06/13 1640) FiO2 (%):  [28 %] 28 % (06/13 1452) Weight:  [149 lb 0.5 oz (67.6 kg)] 149 lb 0.5 oz (67.6 kg) (06/13 1350)     Intake/Output from previous day: 06/13 0701 - 06/14 0700 In: 4065.4 [P.O.:480; I.V.:3485.4] Out: 2270 [Urine:1880; Blood:120; Chest Tube:270]   Physical Exam:  Cardiovascular: RRR Pulmonary: Clear to auscultation on the right and coarse on the left Abdomen: Soft, non tender, bowel sounds present. Extremities: SCDs in place Wounds: Clean and dry.  No erythema or signs of infection. Chest Tube: to suction, small air leak with cough  Lab Results: CBC: Recent Labs    03/11/18 0959 03/14/18 0342  WBC 7.7 13.2*  HGB 15.3* 12.3  HCT 45.7 37.5  PLT 286 255   BMET:  Recent Labs    03/11/18 0959 03/14/18 0342  NA 137 134*  K 4.3 4.4  CL 107 109  CO2 19* 19*  GLUCOSE 102* 125*  BUN 9 15  CREATININE 0.83 0.85  CALCIUM 9.5 7.8*    PT/INR:  Recent Labs    03/11/18 0959  LABPROT 12.5  INR 0.94   ABG:  INR: Will add last result for INR, ABG once components are confirmed Will add last 4 CBG results once components are confirmed  Assessment/Plan:  1. CV - SR in the 70-80's. On Propanolol 20 mg every evening. 2.  Pulmonary - Chest tube is to suction. There is a small air leak with cough. Chest tubes with 270  cc since surgery. Chest tube to remain to suction for now. CXR this am shows no pneumothorax, mild subcutaneous emphysema left chest wall, and right lung is clear. Continue Dulera and Albuterol. Await final pathology.  Encourage incentive spirometer. 3. Remove a line and foley later today  Donielle M ZimmermanPA-C 03/14/2018,7:45 AM   Patient seen and examined, agree with above Doing well POD # 1  Samyiah Halvorsen C. Roxan Hockey, MD [facility] [phone]

## 2018-03-14 NOTE — Progress Notes (Signed)
Patient not wearing [place] at this time. Sats are 93% on room air. Vitals are stable. RT will continue to monitor.

## 2018-03-14 NOTE — Plan of Care (Signed)
Patient educated thoroughly on Incentive spirometer, breathing exercises, importance of ambulation, and plan of care.

## 2018-03-14 NOTE — Progress Notes (Signed)
Wasted 26ml of fentanyl from pca with Phineas Douglas, RN.

## 2018-03-14 NOTE — Discharge Instructions (Signed)
Lung Cancer Lung cancer occurs when abnormal cells in the lung grow out of control and form a mass (tumor). There are several types of lung cancer. The two most common types are:  Non-small cell. In this type of lung cancer, abnormal cells are larger and grow more slowly than those of small cell lung cancer.  Small cell. In this type of lung cancer, abnormal cells are smaller than those of non-small cell lung cancer. Small cell lung cancer gets worse faster than non-small cell lung cancer.  What are the causes? The leading cause of lung cancer is smoking tobacco. The second leading cause is radon exposure. What increases the risk?  Smoking tobacco.  Exposure to secondhand tobacco smoke.  Exposure to radon gas.  Exposure to asbestos.  Exposure to arsenic in drinking water.  Air pollution.  Family or personal history of lung cancer.  Lung radiation therapy.  Being older than 57 years. What are the signs or symptoms? In the early stages, symptoms may not be present. As the cancer progresses, symptoms may include:  A lasting cough, possibly with blood.  Fatigue.  Unexplained weight loss.  Shortness of breath.  Wheezing.  Chest pain.  Loss of appetite.  Symptoms of advanced lung cancer include:  Hoarseness.  Bone or joint pain.  Weakness.  Nail problems.  Face or arm swelling.  Paralysis of the face.  Drooping eyelids.  How is this diagnosed? Lung cancer can be identified with a physical exam and with tests such as:  A chest X-ray.  A CT scan.  Blood tests.  A biopsy.  After a diagnosis is made, you will have more tests to determine the stage of the cancer. The stages of non-small cell lung cancer are:  Stage 0, also called carcinoma in situ. At this stage, abnormal cells are found in the inner lining of your lung or lungs.  Stage I. At this stage, abnormal cells have grown into a tumor that is no larger than 5 cm across. The cancer has entered  the deeper lung tissue but has not yet entered the lymph nodes or other parts of the body.  Stage II. At this stage, the tumor is 7 cm across or smaller and has entered nearby lymph nodes. Or, the tumor is 5 cm across or smaller and has invaded surrounding tissue but is not found in nearby lymph nodes. There may be more than one tumor present.  Stage III. At this stage, the tumor may be any size. There may be more than one tumor in the lungs. The cancer cells have spread to the lymph nodes and possibly to other organs.  Stage IV. At this stage, there are tumors in both lungs and the cancer has spread to other areas of the body.  The stages of small cell lung cancer are:  Limited. At this stage, the cancer is found only on one side of the chest.  Extensive. At this stage, the cancer is in the lungs and in tissues on the other side of the chest. The cancer has spread to other organs or is found in the fluid between the layers of your lungs.  How is this treated? Depending on the type and stage of your lung cancer, you may be treated with:  Surgery. This is done to remove a tumor.  Radiation therapy. This treatment destroys cancer cells using X-rays or other types of radiation.  Chemotherapy. This treatment uses medicines to destroy cancer cells.  Targeted therapy. This treatment  aims to destroy only cancer cells instead of all cells as other therapies do.  You may also have a combination of treatments. Follow these instructions at home:  Do not use any tobacco products. This includes cigarettes, chewing tobacco, and electronic cigarettes. If you need help quitting, ask your health care provider.  Take medicines only as directed by your health care provider.  Eat a healthy diet. Work with a dietitian to make sure you are getting the nutrition you need.  Consider joining a support group or seeking counseling to help you cope with the stress of having lung cancer.  Let your cancer  specialist (oncologist) know if you are admitted to the hospital.  Keep all follow-up visits as directed by your health care provider. This is important. Contact a health care provider if:  You lose weight without trying.  You have a persistent cough and wheezing.  You feel short of breath.  You tire easily.  You experience bone or joint pain.  You have difficulty swallowing.  You feel hoarse or notice your voice changing.  Your pain medicine is not helping. Get help right away if:  You cough up blood.  You have new breathing problems.  You develop chest pain.  You develop swelling in: ? One or both ankles or legs. ? Your face, neck, or arms.  You are confused.  You experience paralysis in your face or a drooping eyelid. This information is not intended to replace advice given to you by your health care provider. Make sure you discuss any questions you have with your health care provider. Document Released: 12/24/2000 Document Revised: 02/23/2016 Document Reviewed: 01/21/2014 Elsevier Interactive Patient Education  2018 [employer]. Thoracoscopy, Care After Refer to this sheet in the next few weeks. These instructions provide you with information about caring for yourself after your procedure. Your health care provider may also give you more specific instructions. Your treatment has been planned according to current medical practices, but problems sometimes occur. Call your health care provider if you have any problems or questions after your procedure. What can I expect after the procedure? After your procedure, it is common to feel sore for up to two weeks. Follow these instructions at home:  There are many different ways to close and cover an incision, including stitches (sutures), skin glue, and adhesive strips. Follow your health care provider's instructions about: ? Incision care. ? Bandage (dressing) changes and removal. ? Incision closure removal.  Check  your incision area every day for signs of infection. Watch for: ? Redness, swelling, or pain. ? Fluid, blood, or pus.  Take medicines only as directed by your health care provider.  Try to cough often. Coughing helps to protect against lung infection (pneumonia). It may hurt to cough. If this happens, hold a pillow against your chest when you cough.  Take deep breaths. This also helps to protect against pneumonia.  If you were given an incentive spirometer, use it as directed by your health care provider.  Do not take baths, swim, or use a hot tub until your health care provider approves. You may take showers.  Avoid lifting until your health care provider approves.  Avoid driving until your health care provider approves.  Do not travel by airplane after the chest tube is removed until your health care provider approves. Contact a health care provider if:  You have a fever.  Pain medicines do not ease your pain.  You have redness, swelling, or increasing pain in  your incision area.  You develop a cough that does not go away, or you are coughing up mucus that is yellow or green. Get help right away if:  You have fluid, blood, or pus coming from your incision.  There is a bad smell coming from your incision or dressing.  You develop a rash.  You have difficulty breathing.  You cough up blood.  You develop light-headedness or you feel faint.  You develop chest pain.  Your heartbeat feels irregular or very fast. This information is not intended to replace advice given to you by your health care provider. Make sure you discuss any questions you have with your health care provider. Document Released: 04/06/2005 Document Revised: 05/20/2016 Document Reviewed: 06/02/2014 Elsevier Interactive Patient Education  2018 [employer].

## 2018-03-14 NOTE — Discharge Summary (Addendum)
[address]       [place] [address]             [phone]      Physician Discharge Summary  Patient ID: [name] MRN: [id number] DOB/AGE: Jan 31, 1956 62 y.o.  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 Lung mass 03/13/2018  . Chronic bronchitis ([place]) 02/04/2018  . Left upper lobe pulmonary nodule 02/04/2018  . Vaccine counseling 11/13/2017  . Cirrhosis ([place]) 11/13/2017  . 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 Hepatitis C, chronic ([place]) 03/14/2015  . Tobacco use disorder 01/21/2014  . Tremor 10/14/2012  . Migraine headache 08/01/2010  . DEPRESSION 03/19/2008  . GERD 03/19/2008  . NEPHROLITHIASIS, HX OF 03/19/2008   History of the Present Illness:  Patient is a 62 year old female with a history of heavy tobacco abuse (1 pack/day x45 years, currently half pack per day), emphysema, frequent bronchitis who had a CT scan of the chest in 2014 which showed a 7 mm left upper lobe nodule.  In October 2018 she went to the emergency room with chest pain.  He CT scan was done to rule out pulmonary embolus.  The left upper lobe lung nodule was noted to be larger measuring 11 x 14 mm.  She recently saw Dr. Burnice Logan for cough, wheezing and shortness of breath.  He ordered a PET CT scan for follow-up of the lung nodule.  It was grown to 13 x 21 mm and was mildly hypermetabolic.  There was hypermetabolic level 2 cervical nodes that were felt to be most likely reactive.  She also had a small lung nodule in particular a 6 mm groundglass opacity in the right lower lobe which was unchanged in size and not active although it is below the threshold for PET scan.  She was seen by Dr. Roxan Hockey in cardiothoracic surgical consultation and admitted this hospitalization for resection.  Discharged Condition:  good  Pathology:  Diagnosis 1. Lung, wedge biopsy/resection, left upper lobe - MODERATELY DIFFERENTIATED LUNG ADENOCARCINOMA, 1.5 CM, PAPILLARY PREDOMINANT. - CARCINOMA INVADES INTO THE VISCERAL PLEURA. - CARCINOMA INVOLVES RESECTION EDGE OF THE SPECIMEN (THE FINAL RESECTION MARGIN IS REPRESENTED BY SECTIONS 2A AND 2B WHICH ARE NEGATIVE FOR CARCINOMA). - NEGATIVE FOR LYMPHOVASCULAR OR PERINEURAL INVASION. - SEE ONCOLOGY TABLE. 2. Lung, resection (segmental or lobe), lingula of left upper lobe - LUNG PARENCHYMA WITH NUMEROUS AGGREGATES OF INTRAALVEOLAR PIGMENTED MACROPHAGES, LIKELY SMOKING-RELATED INJURY. - NEGATIVE FOR CARCINOMA. 3. Lymph node, biopsy, level 9 - LYMPH NODE, NEGATIVE FOR CARCINOMA (0/1). 4. Lymph node, biopsy, level 10 - LYMPH NODE, NEGATIVE FOR CARCINOMA (0/1). 5. Lymph node, biopsy, level 11 - LYMPH NODE, NEGATIVE FOR CARCINOMA (0/1). 6. Lymph node, biopsy, level 11 #2 - LYMPH NODE, NEGATIVE FOR CARCINOMA (0/1). 7. Lymph node, biopsy, level 11 #3 - LYMPH NODE, NEGATIVE FOR CARCINOMA (0/1). 8. Lymph node, biopsy, level 12 - LYMPH NODE, NEGATIVE FOR CARCINOMA (0/1). 9. Lymph node, biopsy, level 5 - LYMPH NODE, NEGATIVE FOR CARCINOMA (0/1). TNM Code: pT2, pN0.  Hospital Course: Patient was admitted electively and on 03/13/2018 taken to the operating room where she underwent left video-assisted thoracoscopic surgery for lingular segmentectomy  resection of the left upper lobe.  She tolerated the procedure well and was taken to the postanesthesia care unit in stable condition.  Postoperative hospital course: The  patient is remained hemodynamically stable.  The chest tube has been monitored in a routine manner.  Serial chest x-rays have been obtained to assist with management of the chest tube and pulmonary status.  The chest tube was removed on postop day 4.  All of the routine lines, monitors and drainage devices have been discontinued in the standard fashion.   Postoperative day #1 hemoglobin and hematocrit were 12.3/37.5.  BUN and creatinine have remained in the normal range. The aforementioned was dictated by Jadene Pierini PA-C.  Patient did have a fair amount of incisional pain. She was given IV Fentanyl PRN. Her wound is healing without sign of infection.  She is ambulating but needs encouragement. She was still on 2 liters of oxygen, but we were able to wean to room air the morning of 06/18. Final follow up chest x ray after chest tube removal showed a 5% or less left apical pneumothorax, subcutaneous emphysema in the left axillary region, and persistent airspace opacity left mid and lower lung (likely atelectasis). Chest tube sutures will be removed in the office on 03/26/2018 at 10:00 am. Patient has been seen and evaluated by Dr. Roxan Hockey and is felt surgically stable for discharge today.  Consults: None  Significant Diagnostic Studies:  FINAL EXAM: CHEST - 2 VIEW  COMPARISON:  Portable chest x-ray of March 17, 2018  FINDINGS: There is a 5% or less left apical pneumothorax. A small amount of air is noted in the retrosternal region on the lateral view. This may reflect some pneumomediastinum given that there is some low lucency noted along the right heart border on the frontal view similar to that seen yesterday. Patchy airspace opacity persists in the left mid and lower lung. The right lung is largely clear. There is considerable subcutaneous emphysema in the left axillary region. The heart and pulmonary vascularity are normal.  IMPRESSION: 5% or less left apical pneumothorax. Probable small amount of residual pneumomediastinum. Considerable subcutaneous emphysema in the left axillary region may reflect a persistent air leak.  Persistent airspace opacity in the left mid and lower lung may reflect postoperative hemorrhage, residual pneumonia, or atelectasis.   Electronically Signed   By: David  Martinique M.D.   On: 03/18/2018  07:51  Treatments: surgery:  03/13/2018  11:33 AM  PATIENT:  [name]  62 y.o. 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)  PROCEDURE:  Procedure(s): VIDEO ASSISTED THORACOSCOPY (VATS)/WEDGE RESECTION (Left) Lingula section of left upper lobe lobectomy (Left)  Mediastinal lymph node sampling  SURGEON:  Surgeon(s) and Role:    * Melrose Nakayama, MD - Primary  PHYSICIAN ASSISTANT: WAYNE GOLD PA-C  ANESTHESIA:   general   Discharge Exam: Blood pressure (!) 99/51, pulse 68, temperature 98.2 F (36.8 C), temperature source Oral, resp. rate (!) 22, height 5\' 5"  (1.651 m), weight 149 lb 0.5 oz (67.6 kg), SpO2 98 %.  Cardiovascular: RRR Pulmonary: Slightly diminished at bases Abdomen: Soft, non tender, bowel sounds present. Extremities: No LE edema Wounds: Clean and dry.  No erythema or signs of infection.  Disposition: Discharge disposition: 01-Home or Self Care     Stable and discharged to home.   Allergies as of 03/18/2018      Reactions   Aspirin Shortness Of Breath   Morphine Sulfate Other (See Comments)   RED STREAKS ON ARMS.   Glycopyrrolate    UNSPECIFIED REACTION  pt  unsure if she is allergic.   Lactose Intolerance (gi) Nausea And Vomiting   Milk only.Patient states that she can eat cheese.   Penicillins Other (See Comments)    Heartburn, upset stomach only & tolerated AMOXIL after this reaction Has patient had a PCN reaction causing immediate rash, facial/tongue/throat swelling, SOB or lightheadedness with hypotension: No Has patient had a PCN reaction causing severe rash involving mucus membranes or skin necrosis: No Has patient had a PCN reaction that required hospitalization: No Has patient had a PCN reaction occurring within the last 10 years: #  #  #  YES  #  #  #    Sulfa Antibiotics Tinitus       Medication List    STOP taking these medications   varenicline 0.5 MG X 11 & 1 MG X 42 tablet Commonly known as:  CHANTIX STARTING MONTH PAK   varenicline 1 MG tablet Commonly known as:  CHANTIX CONTINUING MONTH PAK     TAKE these medications   albuterol 108 (90 Base) MCG/ACT inhaler Commonly known as:  PROVENTIL HFA;VENTOLIN HFA Inhale 2 puffs into the lungs every 6 (six) hours as needed for shortness of breath.   budesonide-formoterol 160-4.5 MCG/ACT inhaler Commonly known as:  SYMBICORT Inhale 2 puffs into the lungs 2 (two) times daily.   fluticasone 50 MCG/ACT nasal spray Commonly known as:  FLONASE Place 1 spray into both nostrils 2 (two) times daily.   guaiFENesin 600 MG 12 hr tablet Commonly known as:  MUCINEX Take 1 tablet (600 mg total) by mouth 2 (two) times daily as needed for cough or to loosen phlegm.   ibuprofen 200 MG tablet Commonly known as:  ADVIL,MOTRIN Take 400-600 mg by mouth daily as needed for mild pain (for pain.).   oxyCODONE 5 MG immediate release tablet Commonly known as:  Oxy IR/ROXICODONE Take 5 mg by mouth every 4-6 hours PRN severe pain.   PARoxetine 20 MG tablet Commonly known as:  PAXIL TAKE 1 TABLET BY MOUTH ONCE DAILY What changed:    how much to take  how to take this  when to take this   propranolol 20 MG tablet Commonly known as:  INDERAL Take 1 tablet (20 mg total) by mouth every evening. (1900) What changed:    when to take this  additional instructions      Follow-up Information    Melrose Nakayama, MD. Go on 04/15/2018.   Specialty:  Cardiothoracic Surgery Why:  Appointment to see the surgeon on 04/15/2018 at 12:45 PM.  Please obtain a chest x-ray at [facility] at 12:15 PM.  It is located in the same office complex on the first floor. Contact information: [address] [employer] [place] [address] [phone]        Nurse. Go on 03/26/2018.   Why:  Appointment is with nurse only for  chest tube suture removal. Contact information: [address] [place] [address]          Signed: Arnoldo Lenis 03/18/2018, 10:07 AM

## 2018-03-14 NOTE — Op Note (Signed)
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  POSTOPERATIVE DIAGNOSIS:  Nonsmall-cell carcinoma, clinical stage IA (T1, N0).  PROCEDURE:   Left video-assisted thoracoscopy, Wedge resection left upper lobe nodule, Lingular segmentectomy, and Mediastinal lymph node sampling.  SURGEON:  Modesto Charon, MD  ASSISTANT:  Jadene Pierini, PA-C  ANESTHESIA:  General.  FINDINGS:  Nodule easily palpable in lingula.  Frozen section revealed nonsmall-cell carcinoma. Multiple benign-appearing lymph nodes.  Bronchial margin negative for tumor.  CLINICAL NOTE:  The patient is a 62 year old woman with a history of tobacco abuse who was found to have a small lung nodule on a CT of the chest back in 2014.  In October, she went to the emergency room with chest pain.  A CT showed the nodule was  larger.  She recently followed up with Dr. Burnice Logan, and he ordered a PET CT to follow up the lung nodule.  It was 13 mm x 21 mm and was mildly hypermetabolic.  She was advised to undergo surgical resection with a plan for a wedge resection and then  segmentectomy or lobectomy.  Her pulmonary function showed significant COPD. The decision was discussed with the patient preoperatively that if this was cancerous, we would do a segmentectomy rather than a lobectomy to preserve pulmonary function.   The indications, risks, benefits, and alternatives were discussed in detail with the patient.  She understood and accepted the risks and agreed to proceed.  OPERATIVE NOTE:  The patient was brought to the preoperative holding area on 03/13/2018.  Anesthesia placed a central line and an arterial blood pressure monitoring line.  Intravenous antibiotics  were administered.  She was taken to the operating room,  anesthetized, and intubated.  Attempts to place a double-lumen endotracheal tube were unsuccessful.  Dr. Orene Desanctis of the anesthesia service placed a single-lumen endotracheal tube with a bronchial blocker.  A Foley catheter was placed.  Sequential  compression devices were placed on the calves for DVT prophylaxis.  She was placed in a right lateral decubitus position, and the left chest was prepped and draped in the usual sterile fashion.  The bronchial blocker in the left main stem bronchus was  inflated.  The chest was prepped and draped in the usual sterile fashion.  After performing a timeout, an incision was made in the 7th interspace in the mid axillary line. A 5 mm port was inserted, but initially there was poor visualization due to cross ventilation.  Adjustments to the bronchial blocker resulting in resolution of cross ventilation.  Suction was applied, and ultimately there was good isolation of the left lung.  A 4 cm working incision was made in the 4th interspace anterolaterally.  No rib  spreading was performed during the procedure.  A solution containing 20 mL of liposomal bupivacaine and 50 mL of normal saline was used to locally anesthetize the port site, as well as the working incision.  The lingular nodule was easily palpable.  A  wedge resection was performed with sequential firings of an Echelon stapler.  A 60 mm stapler with a gold cartridges was used.  The specimen was placed into an endoscopic retrieval bag, removed and sent for frozen section.  While awaiting the results of  the frozen section, the liposomal bupivacaine solution was injected into each interspace from  the 3rd to the 9th.  Five mL was injected in a subpleural fashion at each site.  The frozen section returned showing nonsmall-cell carcinoma.  The inferior ligament was divided.  The level 9 node was removed and sent for pathology.  The pleural reflection was  divided at the hilum posteriorly.  While attempting to dissect out level 7 nodes, a tear occurred in the lower lobe with bleeding.   Pressure was applied and the bleeding stopped in approximately 5 minutes.  No further attempt was made to dissect out the subcarinal lymph nodes.  The pleural reflection was divided to the hilum anteriorly.  The fissure was incomplete.  Two firings of  the Echelon stapler were used to begin separating the lingular segment from the lower lobe.  The dissection then was carried into the hilum.  The lingular vein branch was identified.  It was dissected out, encircled and divided.  All lymph nodes that  were encountered during the dissection were sent as separate specimens for permanent pathology.  A level 11 node was identified at the bifurcation of the upper and lower lobe bronchi and when this was dissected out, the lower lobe branch of the  pulmonary artery was identifiable.  The fissure then was completed up to its midportion with sequential firings of the Echelon stapler.  The lingular segmental PA branch then was dissected out and divided with the endoscopic vascular stapler.  Dissection of  additional nodes then allowed the lingular segmental bronchus to be encircled.  The Echelon stapler with a green cartridge was placed across the lingular bronchus at its origin and closed.  Gentle inflation showed air entry into the remainder of the  upper lobe, and the stapler was fired, transecting the bronchus.  The segmentectomy then was completed with dividing the parenchyma.  This was done with sequential firings of the Echelon stapler, again using gold cartridges and making sure to incorporate  the entire previous staple line.  This specimen was sent for a frozen section of the bronchial margin, which returned with no tumor seen.  The Harmonic scalpel was used to dissect down to level 5 nodes in the aortopulmonary window.  There was good  hemostasis.  The chest was copiously  irrigated with warm saline.  A test inflation revealed minimal air leakage from the parenchyma.  A 28-French chest tube was placed through the original port incision and secured with a #1 silk suture.  The working  incision was closed in 3 layers.  All sponge, needle and instrument counts were correct at the end of the procedure.  The patient was taken from the operating room to the [facility] in good condition.  LN/NUANCE  D:03/13/2018 T:03/14/2018 JOB:[id number]

## 2018-03-15 ENCOUNTER — Inpatient Hospital Stay (HOSPITAL_COMMUNITY): Payer: BLUE CROSS/BLUE SHIELD

## 2018-03-15 LAB — COMPREHENSIVE METABOLIC PANEL
ALT: 16 U/L (ref 14–54)
AST: 24 U/L (ref 15–41)
Albumin: 3 g/dL — ABNORMAL LOW (ref 3.5–5.0)
Alkaline Phosphatase: 73 U/L (ref 38–126)
Anion gap: 8 (ref 5–15)
BUN: 11 mg/dL (ref 6–20)
CO2: 23 mmol/L (ref 22–32)
Calcium: 8.6 mg/dL — ABNORMAL LOW (ref 8.9–10.3)
Chloride: 108 mmol/L (ref 101–111)
Creatinine, Ser: 0.98 mg/dL (ref 0.44–1.00)
GFR calc Af Amer: >60 mL/min (ref >60)
GFR calc non Af Amer: >60 mL/min (ref >60)
Glucose, Bld: 122 mg/dL — ABNORMAL HIGH (ref 65–99)
Potassium: 4.1 mmol/L (ref 3.5–5.1)
Sodium: 139 mmol/L (ref 135–145)
Total Bilirubin: 0.5 mg/dL (ref 0.3–1.2)
Total Protein: 6.1 g/dL — ABNORMAL LOW (ref 6.5–8.1)

## 2018-03-15 LAB — CBC
HCT: 38.5 % (ref 36.0–46.0)
Hemoglobin: 12.5 g/dL (ref 12.0–15.0)
MCH: 29.8 pg (ref 26.0–34.0)
MCHC: 32.5 g/dL (ref 30.0–36.0)
MCV: 91.9 fL (ref 78.0–100.0)
Platelets: 206 10*3/uL (ref 150–400)
RBC: 4.19 MIL/uL (ref 3.87–5.11)
RDW: 12.1 % (ref 11.5–15.5)
WBC: 8.9 10*3/uL (ref 4.0–10.5)

## 2018-03-15 MED ORDER — FENTANYL CITRATE (PF) 100 MCG/2ML IJ SOLN
50.0000 ug | INTRAMUSCULAR | Status: DC | PRN
  Administered 2018-03-15 – 2018-03-17 (×9): 50 ug via INTRAVENOUS
  Filled 2018-03-15 (×9): qty 2

## 2018-03-15 NOTE — Progress Notes (Signed)
2 Days Post-Op Procedure(s) (LRB): VIDEO ASSISTED THORACOSCOPY (VATS)/WEDGE RESECTION (Left) Lingula section of left upper lobe lobectomy (Left) Subjective: C/o pain back and left shooulder Didn't use PCA overnight  Objective: Vital signs in last 24 hours: Temp:  [98 F (36.7 C)-98.6 F (37 C)] 98.6 F (37 C) (06/15 0744) Pulse Rate:  [75-82] 76 (06/15 0744) Cardiac Rhythm: Normal sinus rhythm (06/15 0700) Resp:  [19-27] 22 (06/15 0744) BP: (92-121)/(51-66) 109/51 (06/15 0744) SpO2:  [86 %-96 %] 95 % (06/15 0744)  Hemodynamic parameters for last 24 hours:    Intake/Output from previous day: 06/14 0701 - 06/15 0700 In: 874.3 [P.O.:480; I.V.:394.3] Out: 2280 [Urine:1900; Chest Tube:380] Intake/Output this shift: No intake/output data recorded.  General appearance: alert, cooperative and mild distress Neurologic: intact Heart: regular rate and rhythm Lungs: diminished breath sounds bibasilar small air leak, serous fluid in tubing  Lab Results: Recent Labs    03/14/18 0342 03/15/18 0104  WBC 13.2* 8.9  HGB 12.3 12.5  HCT 37.5 38.5  PLT 255 206   BMET:  Recent Labs    03/14/18 0342 03/15/18 0104  NA 134* 139  K 4.4 4.1  CL 109 108  CO2 19* 23  GLUCOSE 125* 122*  BUN 15 11  CREATININE 0.85 0.98  CALCIUM 7.8* 8.6*    PT/INR: No results for input(s): LABPROT, INR in the last 72 hours. ABG    Component Value Date/Time   PHART 7.314 (L) 03/14/2018 0350   HCO3 20.2 03/14/2018 0350   ACIDBASEDEF 4.9 (H) 03/14/2018 0350   O2SAT 97.1 03/14/2018 0350   CBG (last 3)  No results for input(s): GLUCAP in the last 72 hours.  Assessment/Plan: S/P Procedure(s) (LRB): VIDEO ASSISTED THORACOSCOPY (VATS)/WEDGE RESECTION (Left) Lingula section of left upper lobe lobectomy (Left) POD # 2  Pain control- not using PCA effectively- will change to PO oxycodone with PRN IV fentanyl  Can't use Toradol due to ASA allergy  RESP- small air leak- keep CT to suction. Has more  SQ air but no PTX  Continue IS, bronchodilators  RENAL- creatinine and lytes OK  Mobilize as tolerated  SCD + enoxaparin for DVT prophylaxis   LOS: 2 days    [name] 03/15/2018

## 2018-03-16 ENCOUNTER — Inpatient Hospital Stay (HOSPITAL_COMMUNITY): Payer: BLUE CROSS/BLUE SHIELD

## 2018-03-16 NOTE — Progress Notes (Signed)
3 Days Post-Op Procedure(s) (LRB): VIDEO ASSISTED THORACOSCOPY (VATS)/WEDGE RESECTION (Left) Lingula section of left upper lobe lobectomy (Left) Subjective: Didn't sleep well last night. Still significant incisional pain  Objective: Vital signs in last 24 hours: Temp:  [97.7 F (36.5 C)-98.9 F (37.2 C)] 97.7 F (36.5 C) (06/16 0738) Pulse Rate:  [59-81] 59 (06/16 0738) Cardiac Rhythm: Normal sinus rhythm (06/16 0700) Resp:  [15-28] 15 (06/16 0738) BP: (90-110)/(52-62) 102/57 (06/16 0738) SpO2:  [90 %-100 %] 100 % (06/16 0738)  Hemodynamic parameters for last 24 hours:    Intake/Output from previous day: 06/15 0701 - 06/16 0700 In: 1200 [P.O.:1200] Out: 1080 [Urine:800; Chest Tube:280] Intake/Output this shift: Total I/O In: 0  Out: 100 [Urine:100]  General appearance: alert, cooperative and mild distress Neurologic: intact Heart: regular rate and rhythm Lungs: clear anteriorly, no wheezing, decreased left base no air leak, serous drainage from CT  Lab Results: Recent Labs    03/14/18 0342 03/15/18 0104  WBC 13.2* 8.9  HGB 12.3 12.5  HCT 37.5 38.5  PLT 255 206   BMET:  Recent Labs    03/14/18 0342 03/15/18 0104  NA 134* 139  K 4.4 4.1  CL 109 108  CO2 19* 23  GLUCOSE 125* 122*  BUN 15 11  CREATININE 0.85 0.98  CALCIUM 7.8* 8.6*    PT/INR: No results for input(s): LABPROT, INR in the last 72 hours. ABG    Component Value Date/Time   PHART 7.314 (L) 03/14/2018 0350   HCO3 20.2 03/14/2018 0350   ACIDBASEDEF 4.9 (H) 03/14/2018 0350   O2SAT 97.1 03/14/2018 0350   CBG (last 3)  No results for input(s): GLUCAP in the last 72 hours.  Assessment/Plan: S/P Procedure(s) (LRB): VIDEO ASSISTED THORACOSCOPY (VATS)/WEDGE RESECTION (Left) Lingula section of left upper lobe lobectomy (Left) -POD # 3 Path T2, N0 stage IB adenocarcinoma  No air leak- CT to water seal Needs to ambulate Pain control better than yesterday but still an issue SCD + enoxaparin  for DVT prophylaxis    LOS: 3 days    Melrose Nakayama 03/16/2018

## 2018-03-16 NOTE — Plan of Care (Signed)
  Problem: Education: Goal: Knowledge of General Education information will improve Outcome: Progressing   Problem: Health Behavior/Discharge Planning: Goal: Ability to manage health-related needs will improve Outcome: Progressing   Problem: Clinical Measurements: Goal: Ability to maintain clinical measurements within normal limits will improve Outcome: Progressing Goal: Will remain free from infection Outcome: Progressing Goal: Diagnostic test results will improve Outcome: Progressing Goal: Respiratory complications will improve Outcome: Progressing Goal: Cardiovascular complication will be avoided Outcome: Progressing   Problem: Nutrition: Goal: Adequate nutrition will be maintained Outcome: Progressing   Problem: Coping: Goal: Level of anxiety will decrease Outcome: Progressing   Problem: Elimination: Goal: Will not experience complications related to bowel motility Outcome: Progressing Goal: Will not experience complications related to urinary retention Outcome: Progressing   Problem: Pain Managment: Goal: General experience of comfort will improve Outcome: Progressing   Problem: Safety: Goal: Ability to remain free from injury will improve Outcome: Progressing   Problem: Skin Integrity: Goal: Risk for impaired skin integrity will decrease Outcome: Progressing   Problem: Education: Goal: Knowledge of disease or condition will improve Outcome: Progressing Goal: Knowledge of the prescribed therapeutic regimen will improve Outcome: Progressing   Problem: Activity: Goal: Risk for activity intolerance will decrease Outcome: Progressing   Problem: Cardiac: Goal: Hemodynamic stability will improve Outcome: Progressing   Problem: Clinical Measurements: Goal: Postoperative complications will be avoided or minimized Outcome: Progressing   Problem: Respiratory: Goal: Respiratory status will improve Outcome: Progressing   Problem: Skin Integrity: Goal:  Wound healing without signs and symptoms infection will improve Outcome: Progressing

## 2018-03-17 ENCOUNTER — Inpatient Hospital Stay (HOSPITAL_COMMUNITY): Payer: BLUE CROSS/BLUE SHIELD

## 2018-03-17 NOTE — Progress Notes (Addendum)
      [address]       [place] [address]             [phone]       4 Days Post-Op Procedure(s) (LRB): VIDEO ASSISTED THORACOSCOPY (VATS)/WEDGE RESECTION (Left) Lingula section of left upper lobe lobectomy (Left)  Subjective: Patient still with a fair amount of incisional pain. She states her thighs ache-muscle like pain this am.   Objective: Vital signs in last 24 hours: Temp:  [97.9 F (36.6 C)-99 F (37.2 C)] 98.2 F (36.8 C) (06/17 0742) Pulse Rate:  [66-73] 66 (06/17 0713) Cardiac Rhythm: Normal sinus rhythm (06/17 0713) Resp:  [19-28] 22 (06/17 0713) BP: (98-109)/(52-69) 103/61 (06/17 0713) SpO2:  [93 %-98 %] 97 % (06/17 0724)     Intake/Output from previous day: 06/16 0701 - 06/17 0700 In: 720 [P.O.:720] Out: 1170 [Urine:1100; Chest Tube:70]   Physical Exam:  Cardiovascular: RRR Pulmonary: Clear to auscultation on the right and decreased at the left base Abdomen: Soft, non tender, bowel sounds present. Extremities: No LE edema Wounds: Clean and dry.  No erythema or signs of infection. Chest Tube: to water seal, no air leak  Lab Results: CBC: Recent Labs    03/15/18 0104  WBC 8.9  HGB 12.5  HCT 38.5  PLT 206   BMET:  Recent Labs    03/15/18 0104  NA 139  K 4.1  CL 108  CO2 23  GLUCOSE 122*  BUN 11  CREATININE 0.98  CALCIUM 8.6*    PT/INR:  No results for input(s): LABPROT, INR in the last 72 hours. ABG:  INR: Will add last result for INR, ABG once components are confirmed Will add last 4 CBG results once components are confirmed  Assessment/Plan:  1. CV - SR in the 70-80's. On Propanolol 20 mg every evening. 2.  Pulmonary - Chest tube is to water seal. There is a small air leak with cough. Chest tube with 70 cc last 24 hours.  CXR this am shows no pneumothorax, mild subcutaneous emphysema left lateral chest wall, and bibasilar atelectasis L>R. Hoope to remove chest tube today.Continue Dulera and Albuterol.   On  2 liters of oxygen via [place]-wean to room air as tolerates.Encourage incentive spirometer. 3. Must increase ambulation  Donielle M ZimmermanPA-C 03/17/2018,8:00 AM  [phone] Patient seen and examined, agree with above No air leak and minimal drainage- DC chest tube Increase ambulation Hopefully home tomorrow if able to progress with tube out  Remo Lipps C. Roxan Hockey, MD [facility] [phone]

## 2018-03-18 ENCOUNTER — Inpatient Hospital Stay (HOSPITAL_COMMUNITY): Payer: BLUE CROSS/BLUE SHIELD

## 2018-03-18 MED ORDER — OXYCODONE HCL 5 MG PO TABS: ORAL_TABLET | ORAL | 0 refills | Status: DC

## 2018-03-18 MED ORDER — PROPRANOLOL HCL 20 MG PO TABS: 20.0000 mg | ORAL_TABLET | Freq: Every evening | ORAL | Status: DC

## 2018-03-18 MED ORDER — GUAIFENESIN ER 600 MG PO TB12: 600.0000 mg | ORAL_TABLET | Freq: Two times a day (BID) | ORAL | Status: DC | PRN

## 2018-03-18 NOTE — Progress Notes (Signed)
SATURATION QUALIFICATIONS: (This note is used to comply with regulatory documentation for home oxygen)  Patient Saturations on Room Air at Rest= 94%  Patient Saturations on Room Air while Ambulating= 90-96%  Patient Saturations on Liters of oxygen while Ambulating = N/A  Please briefly explain why patient needs home oxygen: does not qualify for home O2

## 2018-03-18 NOTE — Progress Notes (Addendum)
      [address]       [place] [address]             [phone]       5 Days Post-Op Procedure(s) (LRB): VIDEO ASSISTED THORACOSCOPY (VATS)/WEDGE RESECTION (Left) Lingula section of left upper lobe lobectomy (Left)  Subjective: Patient has had cough post op and Mucinex does help some.  Objective: Vital signs in last 24 hours: Temp:  [98.2 F (36.8 C)-98.9 F (37.2 C)] 98.2 F (36.8 C) (06/18 0402) Pulse Rate:  [66-92] 68 (06/18 0402) Cardiac Rhythm: Normal sinus rhythm (06/18 0700) Resp:  [19-24] 22 (06/18 0402) BP: (96-109)/(51-58) 99/51 (06/18 0402) SpO2:  [92 %-98 %] 98 % (06/18 0721)     Intake/Output from previous day: 06/17 0701 - 06/18 0700 In: 240 [P.O.:240] Out: 100 [Urine:100]   Physical Exam:  Cardiovascular: RRR Pulmonary: Slightly diminished at bases Abdomen: Soft, non tender, bowel sounds present. Extremities: No LE edema Wounds: Clean and dry.  No erythema or signs of infection.   Lab Results: CBC: No results for input(s): WBC, HGB, HCT, PLT in the last 72 hours. BMET:  No results for input(s): NA, K, CL, CO2, GLUCOSE, BUN, CREATININE, CALCIUM in the last 72 hours.  PT/INR:  No results for input(s): LABPROT, INR in the last 72 hours. ABG:  INR: Will add last result for INR, ABG once components are confirmed Will add last 4 CBG results once components are confirmed  Assessment/Plan:  1. CV - SR in the 70-80's. On Propanolol 20 mg every evening. 2.  Pulmonary - Chest tube removed yesterday.  CXR this am appears stable ( no pneumothorax, mild subcutaneous emphysema left lateral chest wall, and bibasilar atelectasis L>R).Continue Dulera and Albuterol.   On 2 liters of oxygen via [place]-wean to room air as tolerates.Encourage incentive spirometer. 3. Patient states she thinks she needs another day. Will ask nurse today to document ambulatory status and try to wean off oxygen. Discharge disposition to be determined.  Sharalyn Ink  ZimmermanPA-C 03/18/2018,7:43 AM  [phone]  Patient seen and examined, agree with above She is very anxious but I think she is medically ready to go home  Remo Lipps C. Roxan Hockey, MD [facility] [phone]

## 2018-03-18 NOTE — Progress Notes (Signed)
Patient discharged home with sister, paperwork reviewed and personal belongings collected. IV removed, patient wheeled to front entrance.

## 2018-03-20 ENCOUNTER — Other Ambulatory Visit: Payer: Self-pay | Admitting: *Deleted

## 2018-03-26 ENCOUNTER — Ambulatory Visit (INDEPENDENT_AMBULATORY_CARE_PROVIDER_SITE_OTHER): Payer: Self-pay | Admitting: *Deleted

## 2018-03-26 DIAGNOSIS — Z902 Acquired absence of lung [part of]: Secondary | ICD-10-CM

## 2018-03-26 DIAGNOSIS — C3412 Malignant neoplasm of upper lobe, left bronchus or lung: Secondary | ICD-10-CM

## 2018-03-26 DIAGNOSIS — Z4802 Encounter for removal of sutures: Secondary | ICD-10-CM

## 2018-03-26 NOTE — Progress Notes (Signed)
[name] returns to the office, s/p L VATS, LULobectomy 03/13/18. She has done well after surgery. Diet and bowels are good. She has only had to use one Oxycodone since discharge, otherwise Advil has been sufficient.  Her left mini thoracotomy incision is well healed with glue remaining. Her one chest tube site is healed and the suture was easily removed. She will return as scheduled with a CXR. She asked about driving. Since she is not taking narcotics. I said she was allowed to drive after two weeks, only very short distances and she understood.

## 2018-03-31 ENCOUNTER — Other Ambulatory Visit: Payer: Self-pay | Admitting: Internal Medicine

## 2018-04-01 ENCOUNTER — Other Ambulatory Visit: Payer: Self-pay | Admitting: Internal Medicine

## 2018-04-02 ENCOUNTER — Encounter: Payer: Self-pay | Admitting: Internal Medicine

## 2018-04-04 NOTE — Telephone Encounter (Signed)
/  Spoke to pt and she picked up Rx. No further action needed. Pt also wanted to consult with Dr. Burnice Logan about [name] a new provider and some generalized questions about her health. Pt was scheduled for next week 04/08/2018.

## 2018-04-07 ENCOUNTER — Other Ambulatory Visit: Payer: Self-pay | Admitting: Internal Medicine

## 2018-04-07 MED ORDER — PAROXETINE HCL 20 MG PO TABS: 20.0000 mg | ORAL_TABLET | Freq: Every day | ORAL | 3 refills | Status: DC

## 2018-04-10 ENCOUNTER — Ambulatory Visit: Payer: BLUE CROSS/BLUE SHIELD | Admitting: Internal Medicine

## 2018-04-14 ENCOUNTER — Other Ambulatory Visit: Payer: Self-pay | Admitting: Thoracic Surgery (Cardiothoracic Vascular Surgery)

## 2018-04-14 DIAGNOSIS — R918 Other nonspecific abnormal finding of lung field: Secondary | ICD-10-CM

## 2018-04-15 ENCOUNTER — Ambulatory Visit (INDEPENDENT_AMBULATORY_CARE_PROVIDER_SITE_OTHER): Payer: Self-pay | Admitting: Thoracic Surgery (Cardiothoracic Vascular Surgery)

## 2018-04-15 ENCOUNTER — Encounter: Payer: Self-pay | Admitting: Thoracic Surgery (Cardiothoracic Vascular Surgery)

## 2018-04-15 ENCOUNTER — Other Ambulatory Visit: Payer: Self-pay

## 2018-04-15 ENCOUNTER — Ambulatory Visit
Admission: RE | Admit: 2018-04-15 | Discharge: 2018-04-15 | Disposition: A | Discharge status: Home / Self Care | Payer: BLUE CROSS/BLUE SHIELD | Source: Ambulatory Visit | Attending: Thoracic Surgery (Cardiothoracic Vascular Surgery) | Admitting: Thoracic Surgery (Cardiothoracic Vascular Surgery)

## 2018-04-15 VITALS — BP 114/70 | HR 76 | Resp 16 | Ht 65.0 in | Wt 152.6 lb

## 2018-04-15 DIAGNOSIS — Z09 Encounter for follow-up examination after completed treatment for conditions other than malignant neoplasm: Secondary | ICD-10-CM

## 2018-04-15 DIAGNOSIS — C3412 Malignant neoplasm of upper lobe, left bronchus or lung: Secondary | ICD-10-CM

## 2018-04-15 DIAGNOSIS — R918 Other nonspecific abnormal finding of lung field: Secondary | ICD-10-CM

## 2018-04-15 MED ORDER — GABAPENTIN 300 MG PO CAPS: 300.0000 mg | ORAL_CAPSULE | Freq: Three times a day (TID) | ORAL | 1 refills | Status: DC

## 2018-04-15 NOTE — Progress Notes (Signed)
[address]       [place] [address]             [phone]       HPI: Mrs. Bialas returns for scheduled postoperative follow-up visit  Beuna Bolding is a 62 year old woman with a history of tobacco abuse, mild COPD, hepatitis C, arthritis, depression and anxiety.  In October 2018 she had a CT to rule out pulmonary embolus.  It showed a left upper lobe nodule measuring 11 x 14 mm.  Dr. Burnice Logan did a PET CT to follow-up the nodule and it had increased to 13 x 21 mm and was mildly hypermetabolic.  I did a left video thoracoscopic lingular segmentectomy on her on 03/13/2018.  The nodule turned out to be a T2, N0, stage IB adenocarcinoma.  She did well postoperatively without any significant complications.  She feels well.  She says her breathing is actually better than it was preoperatively.  She attributes that to stopping smoking.  She does have some discomfort which she describes as tenderness and some referred pain from the incision.  That has improved significantly, but is still present.  Past Medical History:  Diagnosis Date  . Anxiety   . 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 Bronchitis   . Depression   . Dyspnea   . Emphysema, unspecified ([place])    per patient mild case  . Family history of adverse reaction to anesthesia    patient states sister has a hard time waking up from anesthesia.   . Hepatitis C   . Tremor, unspecified    non-specific; takes inderal.     Current Outpatient Medications  Medication Sig Dispense Refill  . albuterol (PROVENTIL HFA;VENTOLIN HFA) 108 (90 Base) MCG/ACT inhaler Inhale 2 puffs into the lungs every 6 (six) hours as needed for shortness of breath. 18 g 4  . budesonide-formoterol (SYMBICORT) 160-4.5 MCG/ACT inhaler Inhale 2 puffs into the lungs 2 (two) times daily. 1 Inhaler 3  . fluticasone (FLONASE) 50 MCG/ACT nasal spray Place 1 spray into both nostrils 2 (two) times daily. 16 g 0  . guaiFENesin (MUCINEX) 600 MG 12 hr  tablet Take 1 tablet (600 mg total) by mouth 2 (two) times daily as needed for cough or to loosen phlegm.    Marland Kitchen ibuprofen (ADVIL,MOTRIN) 200 MG tablet Take 400-600 mg by mouth daily as needed for mild pain (for pain.).     Marland Kitchen PARoxetine (PAXIL) 20 MG tablet Take 1 tablet (20 mg total) by mouth daily. 90 tablet 3  . propranolol (INDERAL) 20 MG tablet Take 1 tablet (20 mg total) by mouth every evening. (1900)     No current facility-administered medications for this visit.     Physical Exam: BP 114/70 (BP Location: Left Arm, Patient Position: Sitting, Cuff Size: Normal)   Pulse 76   Resp 16   Ht 5\' 5"  (1.651 m)   Wt 152 lb 9.6 oz (69.2 kg)   SpO2 97% Comment: ON RA  BMI 25.74 kg/m  62 year old woman in no acute distress Alert and oriented x3 with no focal deficits Lungs clear with equal breath sounds bilaterally  Diagnostic Tests: CHEST - 2 VIEW  COMPARISON:  Chest x-ray dated March 18, 2018.  FINDINGS: The heart size and mediastinal contours are within normal limits. Normal pulmonary vascularity. Postsurgical changes in the left lung. Pleural thickening at the left costophrenic angle. Resolved airspace disease in the left mid to lower lung. No focal consolidation, pleural  effusion, or pneumothorax. No acute osseous abnormality.  IMPRESSION: 1. Postsurgical changes in the left lung. No active cardiopulmonary disease.   Electronically Signed   By: Titus Dubin M.D.   On: 04/15/2018 12:34 I personally reviewed the chest x-ray images and concur with the findings noted above  Impression: Shacola Schussler is a 63 year old woman with a history of tobacco abuse who was found to have a lung nodule on a CT that was done to evaluate for possible pulmonary embolus.  On follow-up CT the nodule had increased in size.  On PET it was mildly hypermetabolic.  She underwent a lingular segmentectomy on 03/13/2018.  The nodule turned out to be a stage Ib (T2, N0) adenocarcinoma.  She has  done well postoperatively.  She feels like her breathing is better since she is quit smoking than it was preoperatively.  She does have some incisional pain.  She is not taking any narcotics.  She is taking Advil but it is not helping.  The pain sounds neuropathic.  We will try gabapentin.  At this point her activities are unrestricted.  She should build a new activities gradually.  She may return to work on July 25.  Plan: Gabapentin 300 mg PO TID Referral to multidisciplinary thoracic oncology clinic to see Dr. Julien Nordmann Return in 2 months with PA and lateral chest x-ray   Melrose Nakayama, MD [facility] [phone]

## 2018-04-16 ENCOUNTER — Other Ambulatory Visit: Payer: Self-pay | Admitting: Internal Medicine

## 2018-04-16 ENCOUNTER — Telehealth: Payer: Self-pay | Admitting: *Deleted

## 2018-04-16 NOTE — Telephone Encounter (Signed)
Oncology Nurse Navigator Documentation  Oncology Nurse Navigator Flowsheets 04/16/2018  Navigator Location [facility]  Referral date to RadOnc/MedOnc 04/15/2018  Navigator Encounter Type Telephone/I received a referral on Ms. Bronkema late yesterday. I called her today to schedule an appt but was unable to reach her.  I did leave vm message with my name and phone number to call me.   Telephone Outgoing Call  Barriers/Navigation Needs Coordination of Care  Interventions Coordination of Care  Coordination of Care Other  Acuity Level 1  Time Spent with Patient 15

## 2018-04-18 ENCOUNTER — Telehealth: Payer: Self-pay | Admitting: *Deleted

## 2018-04-18 NOTE — Telephone Encounter (Signed)
Oncology Nurse Navigator Documentation  Oncology Nurse Navigator Flowsheets 04/18/2018  Navigator Location [facility]  Navigator Encounter Type Telephone/I called Ms. Cowgill to schedule her to be seen with Dr. Julien Nordmann. I was unable to reach but did leave a vm message for her to call me.   Telephone Outgoing Call  Barriers/Navigation Needs Coordination of Care  Interventions Coordination of Care  Acuity Level 1  Time Spent with Patient 15

## 2018-04-21 ENCOUNTER — Telehealth: Payer: Self-pay | Admitting: *Deleted

## 2018-04-21 DIAGNOSIS — R911 Solitary pulmonary nodule: Secondary | ICD-10-CM

## 2018-04-21 NOTE — Telephone Encounter (Signed)
Oncology Nurse Navigator Documentation  Oncology Nurse Navigator Flowsheets 04/21/2018  Navigator Location [facility]  Navigator Encounter Type Telephone/patient called and left vm message to call.  I called and was able to schedule her to be seen with Dr. Julien Nordmann. She verbalized understanding of appt time and place.   Telephone Incoming Call;Outgoing Call  Barriers/Navigation Needs Coordination of Care  Interventions Coordination of Care  Coordination of Care Appts  Acuity Level 1  Time Spent with Patient 15

## 2018-05-08 ENCOUNTER — Encounter: Payer: Self-pay | Admitting: Internal Medicine

## 2018-05-08 ENCOUNTER — Inpatient Hospital Stay: Payer: BLUE CROSS/BLUE SHIELD

## 2018-05-08 ENCOUNTER — Inpatient Hospital Stay: Payer: BLUE CROSS/BLUE SHIELD | Attending: Internal Medicine | Admitting: Internal Medicine

## 2018-05-08 ENCOUNTER — Telehealth: Payer: Self-pay | Admitting: Internal Medicine

## 2018-05-08 DIAGNOSIS — Z803 Family history of malignant neoplasm of breast: Secondary | ICD-10-CM | POA: Diagnosis not present

## 2018-05-08 DIAGNOSIS — R911 Solitary pulmonary nodule: Secondary | ICD-10-CM

## 2018-05-08 DIAGNOSIS — C3412 Malignant neoplasm of upper lobe, left bronchus or lung: Secondary | ICD-10-CM | POA: Insufficient documentation

## 2018-05-08 DIAGNOSIS — Z801 Family history of malignant neoplasm of trachea, bronchus and lung: Secondary | ICD-10-CM | POA: Diagnosis not present

## 2018-05-08 DIAGNOSIS — Z87891 Personal history of nicotine dependence: Secondary | ICD-10-CM | POA: Insufficient documentation

## 2018-05-08 DIAGNOSIS — Z79899 Other long term (current) drug therapy: Secondary | ICD-10-CM | POA: Insufficient documentation

## 2018-05-08 DIAGNOSIS — C3490 Malignant neoplasm of unspecified part of unspecified bronchus or lung: Secondary | ICD-10-CM

## 2018-05-08 DIAGNOSIS — B182 Chronic viral hepatitis C: Secondary | ICD-10-CM

## 2018-05-08 DIAGNOSIS — Z8249 Family history of ischemic heart disease and other diseases of the circulatory system: Secondary | ICD-10-CM | POA: Diagnosis not present

## 2018-05-08 LAB — CBC WITH DIFFERENTIAL (CANCER CENTER ONLY)
Basophils Absolute: 0.1 10*3/uL (ref 0.0–0.1)
Basophils Relative: 1 %
Eosinophils Absolute: 0.2 10*3/uL (ref 0.0–0.5)
Eosinophils Relative: 3 %
HCT: 41 % (ref 34.8–46.6)
Hemoglobin: 13.5 g/dL (ref 11.6–15.9)
Lymphocytes Relative: 27 %
Lymphs Abs: 2.3 10*3/uL (ref 0.9–3.3)
MCH: 29.1 pg (ref 25.1–34.0)
MCHC: 32.9 g/dL (ref 31.5–36.0)
MCV: 88.4 fL (ref 79.5–101.0)
Monocytes Absolute: 0.7 10*3/uL (ref 0.1–0.9)
Monocytes Relative: 9 %
Neutro Abs: 5.1 10*3/uL (ref 1.5–6.5)
Neutrophils Relative %: 60 %
Platelet Count: 304 10*3/uL (ref 145–400)
RBC: 4.64 MIL/uL (ref 3.70–5.45)
RDW: 12.7 % (ref 11.2–14.5)
WBC Count: 8.4 10*3/uL (ref 3.9–10.3)

## 2018-05-08 LAB — CMP (CANCER CENTER ONLY)
ALT: 19 U/L (ref 0–44)
AST: 21 U/L (ref 15–41)
Albumin: 4 g/dL (ref 3.5–5.0)
Alkaline Phosphatase: 132 U/L — ABNORMAL HIGH (ref 38–126)
Anion gap: 12 (ref 5–15)
BUN: 14 mg/dL (ref 8–23)
CO2: 24 mmol/L (ref 22–32)
Calcium: 9.5 mg/dL (ref 8.9–10.3)
Chloride: 102 mmol/L (ref 98–111)
Creatinine: 0.97 mg/dL (ref 0.44–1.00)
GFR, Est AFR Am: >60 mL/min (ref >60)
GFR, Estimated: >60 mL/min (ref >60)
Glucose, Bld: 102 mg/dL — ABNORMAL HIGH (ref 70–99)
Potassium: 4.8 mmol/L (ref 3.5–5.1)
Sodium: 138 mmol/L (ref 135–145)
Total Bilirubin: 0.7 mg/dL (ref 0.3–1.2)
Total Protein: 8.5 g/dL — ABNORMAL HIGH (ref 6.5–8.1)

## 2018-05-08 NOTE — Patient Instructions (Signed)
Steps to Quit Smoking Smoking tobacco can be bad for your health. It can also affect almost every organ in your body. Smoking puts you and people around you at risk for many serious long-lasting (chronic) diseases. Quitting smoking is hard, but it is one of the best things that you can do for your health. It is never too late to quit. What are the benefits of quitting smoking? When you quit smoking, you lower your risk for getting serious diseases and conditions. They can include:  Lung cancer or lung disease.  Heart disease.  Stroke.  Heart attack.  Not being able to have children (infertility).  Weak bones (osteoporosis) and broken bones (fractures).  If you have coughing, wheezing, and shortness of breath, those symptoms may get better when you quit. You may also get sick less often. If you are pregnant, quitting smoking can help to lower your chances of having a baby of low birth weight. What can I do to help me quit smoking? Talk with your doctor about what can help you quit smoking. Some things you can do (strategies) include:  Quitting smoking totally, instead of slowly cutting back how much you smoke over a period of time.  Going to in-person counseling. You are more likely to quit if you go to many counseling sessions.  Using resources and support systems, such as: ? Online chats with a counselor. ? Phone quitlines. ? Printed self-help materials. ? Support groups or group counseling. ? Text messaging programs. ? Mobile phone apps or applications.  Taking medicines. Some of these medicines may have nicotine in them. If you are pregnant or breastfeeding, do not take any medicines to quit smoking unless your doctor says it is okay. Talk with your doctor about counseling or other things that can help you.  Talk with your doctor about using more than one strategy at the same time, such as taking medicines while you are also going to in-person counseling. This can help make  quitting easier. What things can I do to make it easier to quit? Quitting smoking might feel very hard at first, but there is a lot that you can do to make it easier. Take these steps:  Talk to your family and friends. Ask them to support and encourage you.  Call phone quitlines, reach out to support groups, or work with a counselor.  Ask people who smoke to not smoke around you.  Avoid places that make you want (trigger) to smoke, such as: ? Bars. ? Parties. ? Smoke-break areas at work.  Spend time with people who do not smoke.  Lower the stress in your life. Stress can make you want to smoke. Try these things to help your stress: ? Getting regular exercise. ? Deep-breathing exercises. ? Yoga. ? Meditating. ? Doing a body scan. To do this, close your eyes, focus on one area of your body at a time from head to toe, and notice which parts of your body are tense. Try to relax the muscles in those areas.  Download or buy apps on your mobile phone or tablet that can help you stick to your quit plan. There are many free apps, such as QuitGuide from the CDC (Centers for Disease Control and Prevention). You can find more support from smokefree.gov and other websites.  This information is not intended to replace advice given to you by your health care provider. Make sure you discuss any questions you have with your health care provider. Document Released: 07/14/2009 Document   Revised: 05/15/2016 Document Reviewed: 02/01/2015 Elsevier Interactive Patient Education  2018 Elsevier Inc.  

## 2018-05-08 NOTE — Progress Notes (Signed)
[place] Telephone:([phone]) [phone]   Fax:([phone]  CONSULT NOTE  REFERRING PHYSICIAN: Dr. Modesto Charon  REASON FOR CONSULTATION:  62 years old white female recently diagnosed with lung cancer.  HPI [name] is a 62 y.o. female with past medical history significant for COPD, depression, arthritis, anxiety, hepatitis C and long history of smoking.  The patient had CT scan of the chest on July 18, 2017 for evaluation of chest and back pain and to rule out pulmonary embolism.  It was negative for acute pulmonary embolus or aortic dissection but it showed 1.4 cm left upper lobe pulmonary nodules increased in size compared to previous exam and suspicious for carcinoma.  She was followed by observation and a PET scan on 02/19/2018 showed mildly hypermetabolic nonenlarged bilateral level 2 neck lymph nodes measuring 0.8 cm with SUV max of 4.6 and 0.6 cm with SUV max of 4.1.  There was a sub-solid 2.2 x 1.3 cm peripheral lingular pulmonary nodule with 0.9 cm solid component and associated with low-level metabolism with SUV max of 2.2 but mildly increased in size from 1.9 x 1.2 cm in October 2018. The patient was referred to Dr. Roxan Hockey.  On March 13, 2018 she underwent left VATS with wedge resection of left upper lobe nodule, lingular segmentectomy and mediastinal lymph node sampling under the care of Dr. Roxan Hockey. The final pathology ([id number]) showed moderately differentiated lung adenocarcinoma measuring 1.5 cm, papillary predominant with invasion into the visceral pleura.  The final resection margins were negative for malignancy and there was no evidence for lymphovascular or perineural invasion. Dr. Roxan Hockey kindly referred the patient to me today for evaluation and recommendation regarding her condition. When seen today the patient is feeling fine with no concerning complaints except for soreness on the left side of the chest as well as shortness of  breath with exertion.  She denied having any significant cough or hemoptysis.  She has no nausea, vomiting, diarrhea or constipation.  She denied having any headache or visual changes.  She has no recent weight loss or night sweats. Family history significant for mother with Alzheimer, father with unknown history.  He died when she was 62 years old.  Paternal aunt with lung cancer paternal uncle also with cancer and maternal aunt with breast cancer. The patient is married and has 2 children and 11 grandchildren.  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  She has a history for smoking 1 pack/day for around 4 years and quit March 13, 2018.  She drinks alcohol occasionally and no history of drug abuse except occasional use of marijuana in the past.  HPI  Past Medical History:  Diagnosis Date  . Anxiety   . 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 Bronchitis   . Depression   . Dyspnea   . Emphysema, unspecified ([place])    per patient mild case  . Family history of adverse reaction to anesthesia    patient states sister has a hard time waking up from anesthesia.   . Hepatitis C   . Tremor, unspecified    non-specific; takes inderal.     Past Surgical History:  Procedure Laterality Date  . ABDOMINAL HYSTERECTOMY  1986  . LOBECTOMY Left 03/13/2018   Procedure: Lingula section of left upper lobe lobectomy;  Surgeon: Melrose Nakayama, MD;  Location: [facility];  Service: Thoracic;  Laterality: Left;  Marland Kitchen VIDEO ASSISTED THORACOSCOPY (VATS)/WEDGE RESECTION Left 03/13/2018   Procedure: VIDEO ASSISTED  THORACOSCOPY (VATS)/WEDGE RESECTION;  Surgeon: Melrose Nakayama, MD;  [facility];  Service: Thoracic;  Laterality: Left;    Family History  Problem Relation Age of Onset  . Asthma Mother   . Alzheimer's disease Mother   . Hypertension Mother   . Cancer Maternal Aunt   . Cancer Paternal Aunt        Breast    Social History Social History   Tobacco Use  . Smoking status:  Former Smoker    Packs/day: 0.25    Types: Cigarettes    Last attempt to quit: 03/13/2018    Years since quitting: 0.1  . Smokeless tobacco: Never Used  . Tobacco comment: patient actively trying to quit.   Substance Use Topics  . Alcohol use: Yes    Comment: rarely, special occasions.   . Drug use: Yes    Frequency: 4.0 times per week    Types: Marijuana    Comment: occasional use. last use; 03/08/18    Allergies  Allergen Reactions  . Aspirin Shortness Of Breath  . Morphine Sulfate Other (See Comments)    RED STREAKS ON ARMS.  Marland Kitchen Oxycodone Itching and Rash  . Glycopyrrolate     UNSPECIFIED REACTION  pt unsure if she is allergic.  . Lactose Intolerance (Gi) Nausea And Vomiting    Milk only.Patient states that she can eat cheese.  Marland Kitchen Penicillins Other (See Comments)     Heartburn, upset stomach only & tolerated AMOXIL after this reaction Has patient had a PCN reaction causing immediate rash, facial/tongue/throat swelling, SOB or lightheadedness with hypotension: No Has patient had a PCN reaction causing severe rash involving mucus membranes or skin necrosis: No Has patient had a PCN reaction that required hospitalization: No Has patient had a PCN reaction occurring within the last 10 years: #  #  #  YES  #  #  #   . Sulfa Antibiotics Tinitus    Current Outpatient Medications  Medication Sig Dispense Refill  . albuterol (PROVENTIL HFA;VENTOLIN HFA) 108 (90 Base) MCG/ACT inhaler Inhale 2 puffs into the lungs every 6 (six) hours as needed for shortness of breath. 18 g 4  . budesonide-formoterol (SYMBICORT) 160-4.5 MCG/ACT inhaler Inhale 2 puffs into the lungs 2 (two) times daily. 1 Inhaler 3  . fluticasone (FLONASE) 50 MCG/ACT nasal spray Place 1 spray into both nostrils 2 (two) times daily. 16 g 0  . gabapentin (NEURONTIN) 300 MG capsule Take 1 capsule (300 mg total) by mouth 3 (three) times daily. 90 capsule 1  . guaiFENesin (MUCINEX) 600 MG 12 hr tablet Take 1 tablet (600 mg  total) by mouth 2 (two) times daily as needed for cough or to loosen phlegm.    Marland Kitchen ibuprofen (ADVIL,MOTRIN) 200 MG tablet Take 400-600 mg by mouth daily as needed for mild pain (for pain.).     Marland Kitchen PARoxetine (PAXIL) 20 MG tablet Take 1 tablet (20 mg total) by mouth daily. 90 tablet 3  . propranolol (INDERAL) 20 MG tablet Take 1 tablet (20 mg total) by mouth every evening. (1900)    . propranolol ER (INDERAL LA) 60 MG 24 hr capsule TAKE 1 CAPSULE BY MOUTH EVERY DAY 90 capsule 1   No current facility-administered medications for this visit.     Review of Systems  Constitutional: negative Eyes: negative Ears, nose, mouth, throat, and face: negative Respiratory: positive for dyspnea on exertion and pleurisy/chest pain Cardiovascular: negative Gastrointestinal: negative Genitourinary:negative Integument/breast: negative Hematologic/lymphatic: negative Musculoskeletal:negative Neurological: negative Behavioral/Psych: negative  Endocrine: negative Allergic/Immunologic: negative  Physical Exam  PNT:IRWER, healthy, no distress, well nourished and well developed SKIN: skin color, texture, turgor are normal, no rashes or significant lesions HEAD: Normocephalic, No masses, lesions, tenderness or abnormalities EYES: normal, PERRLA, Conjunctiva are pink and non-injected EARS: External ears normal, Canals clear OROPHARYNX:no exudate, no erythema and lips, buccal mucosa, and tongue normal  NECK: supple, no adenopathy, no JVD LYMPH:  no palpable lymphadenopathy, no hepatosplenomegaly BREAST:not examined LUNGS: clear to auscultation , and palpation HEART: regular rate & rhythm, no murmurs and no gallops ABDOMEN:abdomen soft, non-tender, normal bowel sounds and no masses or organomegaly BACK: Back symmetric, no curvature., No CVA tenderness EXTREMITIES:no joint deformities, effusion, or inflammation, no edema  NEURO: alert & oriented x 3 with fluent speech, no focal motor/sensory  deficits  PERFORMANCE STATUS: ECOG 1  LABORATORY DATA: Lab Results  Component Value Date   WBC 8.4 05/08/2018   HGB 13.5 05/08/2018   HCT 41.0 05/08/2018   MCV 88.4 05/08/2018   PLT 304 05/08/2018      Chemistry      Component Value Date/Time   NA 138 05/08/2018 1042   K 4.8 05/08/2018 1042   CL 102 05/08/2018 1042   CO2 24 05/08/2018 1042   BUN 14 05/08/2018 1042   CREATININE 0.97 05/08/2018 1042   CREATININE 0.88 02/19/2018 1611      Component Value Date/Time   CALCIUM 9.5 05/08/2018 1042   ALKPHOS 132 (H) 05/08/2018 1042   AST 21 05/08/2018 1042   ALT 19 05/08/2018 1042   ALT 20 10/10/2017 1653   BILITOT 0.7 05/08/2018 1042       RADIOGRAPHIC STUDIES: Dg Chest 2 View  Result Date: 04/15/2018 CLINICAL DATA:  Recent left lingula removal for lung cancer. EXAM: CHEST - 2 VIEW COMPARISON:  Chest x-ray dated March 18, 2018. FINDINGS: The heart size and mediastinal contours are within normal limits. Normal pulmonary vascularity. Postsurgical changes in the left lung. Pleural thickening at the left costophrenic angle. Resolved airspace disease in the left mid to lower lung. No focal consolidation, pleural effusion, or pneumothorax. No acute osseous abnormality. IMPRESSION: 1. Postsurgical changes in the left lung. No active cardiopulmonary disease. Electronically Signed   By: Titus Dubin M.D.   On: 04/15/2018 12:34    ASSESSMENT: This is a very pleasant 62 years old white female recently diagnosed with a stage Ib (T2, N0, M0) non-small cell lung cancer, adenocarcinoma status post wedge resection of the left upper lobe nodule in addition to lingular segmentectomy and mediastinal lymph node sampling under the care of Dr. Roxan Hockey on March 13, 2018.   PLAN: I had a lengthy discussion with the patient today about her current disease of stage, prognosis and treatment options. I explained to the patient that she underwent curative surgery and there is no current evidence of  residual disease. I discussed with the patient the role of adjuvant chemotherapy and explained to her that there is no benefit for adjuvant systemic chemotherapy for patient with stage Ib of the tumor size is less than 4.0 cm. I explained to the patient that the median 5 years survival for stage Ib is around 60%. I recommended for the patient to continue on observation and close monitoring. She will have repeat CT scan of the chest in 6 months for restaging of her disease. She was advised to call immediately if she has any concerning symptoms in the interval. The patient voices understanding of current disease status and treatment options and  is in agreement with the current care plan.  All questions were answered. The patient knows to call the clinic with any problems, questions or concerns. We can certainly see the patient much sooner if necessary.  Thank you so much for allowing me to participate in the care of [facility]. I will continue to follow up the patient with you and assist in her care.  I spent 40 minutes counseling the patient face to face. The total time spent in the appointment was 60 minutes.  Disclaimer: This note was dictated with voice recognition software. Similar sounding words can inadvertently be transcribed and may not be corrected upon review.   Eilleen Kempf May 08, 2018, 11:47 AM

## 2018-05-08 NOTE — Telephone Encounter (Signed)
Appointments scheduled AVS/Calendar printed per 8/8 los

## 2018-05-20 ENCOUNTER — Other Ambulatory Visit: Payer: BLUE CROSS/BLUE SHIELD

## 2018-05-20 DIAGNOSIS — B182 Chronic viral hepatitis C: Secondary | ICD-10-CM

## 2018-05-22 LAB — CBC WITH DIFFERENTIAL/PLATELET
Basophils Absolute: 52 cells/uL (ref 0–200)
Basophils Relative: 0.8 %
Eosinophils Absolute: 189 cells/uL (ref 15–500)
Eosinophils Relative: 2.9 %
HCT: 39.3 % (ref 35.0–45.0)
Hemoglobin: 13.4 g/dL (ref 11.7–15.5)
Lymphs Abs: 1950 cells/uL (ref 850–3900)
MCH: 28.9 pg (ref 27.0–33.0)
MCHC: 34.1 g/dL (ref 32.0–36.0)
MCV: 84.7 fL (ref 80.0–100.0)
MPV: 10.2 fL (ref 7.5–12.5)
Monocytes Relative: 12 %
Neutro Abs: 3530 cells/uL (ref 1500–7800)
Neutrophils Relative %: 54.3 %
Platelets: 315 10*3/uL (ref 140–400)
RBC: 4.64 10*6/uL (ref 3.80–5.10)
RDW: 12.2 % (ref 11.0–15.0)
Total Lymphocyte: 30 %
WBC mixed population: 780 cells/uL (ref 200–950)
WBC: 6.5 10*3/uL (ref 3.8–10.8)

## 2018-05-22 LAB — COMPLETE METABOLIC PANEL WITH GFR
AG Ratio: 1.3 (calc) (ref 1.0–2.5)
ALT: 18 U/L (ref 6–29)
AST: 21 U/L (ref 10–35)
Albumin: 4.4 g/dL (ref 3.6–5.1)
Alkaline phosphatase (APISO): 130 U/L (ref 33–130)
BUN: 11 mg/dL (ref 7–25)
CO2: 27 mmol/L (ref 20–32)
Calcium: 9.4 mg/dL (ref 8.6–10.4)
Chloride: 102 mmol/L (ref 98–110)
Creat: 0.86 mg/dL (ref 0.50–0.99)
GFR, Est African American: 84 mL/min/{1.73_m2} (ref >=60)
GFR, Est Non African American: 72 mL/min/{1.73_m2} (ref >=60)
Globulin: 3.3 g/dL (calc) (ref 1.9–3.7)
Glucose, Bld: 93 mg/dL (ref 65–99)
Potassium: 4.5 mmol/L (ref 3.5–5.3)
Sodium: 137 mmol/L (ref 135–146)
Total Bilirubin: 0.5 mg/dL (ref 0.2–1.2)
Total Protein: 7.7 g/dL (ref 6.1–8.1)

## 2018-05-22 LAB — HEPATITIS C RNA QUANTITATIVE
HCV Quantitative Log: <1.18 Log IU/mL
HCV RNA, PCR, QN: <15 IU/mL

## 2018-05-27 ENCOUNTER — Ambulatory Visit (INDEPENDENT_AMBULATORY_CARE_PROVIDER_SITE_OTHER): Payer: BLUE CROSS/BLUE SHIELD | Admitting: Internal Medicine

## 2018-05-27 ENCOUNTER — Encounter: Payer: Self-pay | Admitting: Internal Medicine

## 2018-05-27 VITALS — BP 122/67 | HR 62 | Temp 97.6°F | Resp 17 | Ht 65.0 in | Wt 159.8 lb

## 2018-05-27 DIAGNOSIS — B182 Chronic viral hepatitis C: Secondary | ICD-10-CM

## 2018-05-27 DIAGNOSIS — Z7189 Other specified counseling: Secondary | ICD-10-CM | POA: Diagnosis not present

## 2018-05-27 DIAGNOSIS — Z7185 Encounter for immunization safety counseling: Secondary | ICD-10-CM

## 2018-05-27 DIAGNOSIS — K7460 Unspecified cirrhosis of liver: Secondary | ICD-10-CM | POA: Diagnosis not present

## 2018-05-27 NOTE — Telephone Encounter (Signed)
ERROR

## 2018-05-27 NOTE — Assessment & Plan Note (Signed)
Hepatitis A and B immune

## 2018-05-27 NOTE — Assessment & Plan Note (Signed)
Now considered cured.  

## 2018-05-27 NOTE — Assessment & Plan Note (Addendum)
Discussed cirrhosis and further work up, monitoring.  I will schedule HCC screening now and in 6 months.  Will send to Dr. Ardis Hughs for ? EGD.   Follow up in 1 year

## 2018-05-27 NOTE — Progress Notes (Signed)
   Subjective:    Patient ID: [name], female    DOB: 09-Aug-1956, 62 y.o.   MRN: [id number]  HPI Here for follow up of chronic hepatitis C. She has genotype 4 and completed 12 weeks of Harvoni.  Now with SVR 12 negative confirming cure.  Also now cancer free from lung cancer.  No new issues. Cirrhosis on ultrasound.  No associated fatigue.     Review of Systems  Constitutional: Negative for unexpected weight change.  Gastrointestinal: Negative for diarrhea.       Objective:   Physical Exam  Constitutional: She appears well-developed and well-nourished. No distress.  HENT:  Mouth/Throat: No oropharyngeal exudate.  Eyes: No scleral icterus.  Cardiovascular: Normal rate, regular rhythm and normal heart sounds.  No murmur heard. Pulmonary/Chest: Effort normal and breath sounds normal. No respiratory distress.  Skin: No rash noted.   SH: no alcohol       Assessment & Plan:

## 2018-05-27 NOTE — Progress Notes (Signed)
Ultrasound has been scheduled for 06/03/18. Patient made in office today. S.Raun Routh,LPN

## 2018-06-03 ENCOUNTER — Other Ambulatory Visit: Payer: BLUE CROSS/BLUE SHIELD

## 2018-06-16 ENCOUNTER — Other Ambulatory Visit: Payer: Self-pay | Admitting: Thoracic Surgery (Cardiothoracic Vascular Surgery)

## 2018-06-16 ENCOUNTER — Other Ambulatory Visit: Payer: BLUE CROSS/BLUE SHIELD

## 2018-06-16 DIAGNOSIS — R918 Other nonspecific abnormal finding of lung field: Secondary | ICD-10-CM

## 2018-06-17 ENCOUNTER — Ambulatory Visit
Admission: RE | Admit: 2018-06-17 | Discharge: 2018-06-17 | Disposition: A | Discharge status: Home / Self Care | Payer: BLUE CROSS/BLUE SHIELD | Source: Ambulatory Visit | Attending: Thoracic Surgery (Cardiothoracic Vascular Surgery) | Admitting: Thoracic Surgery (Cardiothoracic Vascular Surgery)

## 2018-06-17 ENCOUNTER — Encounter: Payer: Self-pay | Admitting: Thoracic Surgery (Cardiothoracic Vascular Surgery)

## 2018-06-17 ENCOUNTER — Ambulatory Visit (INDEPENDENT_AMBULATORY_CARE_PROVIDER_SITE_OTHER): Payer: BLUE CROSS/BLUE SHIELD | Admitting: Thoracic Surgery (Cardiothoracic Vascular Surgery)

## 2018-06-17 ENCOUNTER — Other Ambulatory Visit: Payer: Self-pay

## 2018-06-17 VITALS — BP 124/80 | HR 88 | Resp 15 | Ht 65.0 in | Wt 159.0 lb

## 2018-06-17 DIAGNOSIS — C3412 Malignant neoplasm of upper lobe, left bronchus or lung: Secondary | ICD-10-CM

## 2018-06-17 DIAGNOSIS — R918 Other nonspecific abnormal finding of lung field: Secondary | ICD-10-CM

## 2018-06-17 DIAGNOSIS — Z09 Encounter for follow-up examination after completed treatment for conditions other than malignant neoplasm: Secondary | ICD-10-CM | POA: Diagnosis not present

## 2018-06-17 MED ORDER — GABAPENTIN 300 MG PO CAPS: 600.0000 mg | ORAL_CAPSULE | Freq: Three times a day (TID) | ORAL | 1 refills | Status: DC

## 2018-06-17 NOTE — Progress Notes (Signed)
[address]       ,[place] [address]             [phone]      HPI: [name] returns for a scheduled follow-up visit  [name] is a 62 year old with a history of tobacco abuse, COPD, hepatitis C (treated), arthritis, depression, and anxiety.  She had a thoracoscopic lingular segmentectomy on 03/13/2018 for a T2, N0, stage IB adenocarcinoma.  Early postoperative course was uncomplicated.  I saw her in the office on 04/15/2018.  She was doing well at that time although she did still have some pain.  I started her on gabapentin 300 mg p.o. 3 times daily.  She continues to have pain.  She is somewhat discouraged by this.  She says at times she feels short of breath.  She has been gaining weight.  Past Medical History:  Diagnosis Date  . Anxiety   . 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 Bronchitis   . Depression   . Dyspnea   . Emphysema, unspecified ([place])    per patient mild case  . Family history of adverse reaction to anesthesia    patient states sister has a hard time waking up from anesthesia.   . Hepatitis C   . Tremor, unspecified    non-specific; takes inderal.     Current Outpatient Medications  Medication Sig Dispense Refill  . albuterol (PROVENTIL HFA;VENTOLIN HFA) 108 (90 Base) MCG/ACT inhaler Inhale 2 puffs into the lungs every 6 (six) hours as needed for shortness of breath. 18 g 4  . budesonide-formoterol (SYMBICORT) 160-4.5 MCG/ACT inhaler Inhale 2 puffs into the lungs 2 (two) times daily. 1 Inhaler 3  . fluticasone (FLONASE) 50 MCG/ACT nasal spray Place 1 spray into both nostrils 2 (two) times daily. 16 g 0  . gabapentin (NEURONTIN) 300 MG capsule Take 2 capsules (600 mg total) by mouth 3 (three) times daily. 180 capsule 1  . guaiFENesin (MUCINEX) 600 MG 12 hr tablet Take 1 tablet (600 mg total) by mouth 2 (two) times daily as needed for cough or to loosen phlegm.    Marland Kitchen ibuprofen (ADVIL,MOTRIN) 200 MG tablet Take 400-600 mg by mouth daily  as needed for mild pain (for pain.).     Marland Kitchen pantoprazole (PROTONIX) 20 MG tablet Take 1 tablet by mouth daily.  0  . PARoxetine (PAXIL) 20 MG tablet Take 1 tablet (20 mg total) by mouth daily. 90 tablet 3  . propranolol ER (INDERAL LA) 60 MG 24 hr capsule TAKE 1 CAPSULE BY MOUTH EVERY DAY 90 capsule 1   No current facility-administered medications for this visit.     Physical Exam BP 124/80 (BP Location: Right Arm, Patient Position: Sitting, Cuff Size: Normal)   Pulse 88   Resp 15   Ht 5\' 5"  (1.651 m)   Wt 159 lb (72.1 kg)   SpO2 96% Comment: RA  BMI 26.57 kg/m  62 year old woman in obvious discomfort and emotional Alert and oriented x3 with no focal neurologic deficit Lungs clear with equal breath sounds bilaterally Cardiac regular rate and rhythm Incisions well-healed, tenderness to palpation below and posterior to the thoracotomy incision  Diagnostic Tests: CHEST - 2 VIEW  COMPARISON:  April 15, 2018  FINDINGS: There is postoperative change on the left with mild volume loss. There is no appreciable edema or consolidation. The heart size and pulmonary vascularity are normal. No adenopathy. There is degenerative change in the thoracic spine. No pneumothorax.  IMPRESSION: Postoperative change on left with mild volume loss. No edema or consolidation. Stable cardiac silhouette.   Electronically Signed   By: Lowella Grip III M.D.   On: 06/17/2018 09:22 I personally reviewed the chest x-ray images and concur with the findings noted above  Impression: [name] is a 62 year old woman with a history of tobacco abuse who was found to have a left upper lobe lung nodule.  This turned out to be a stage Ib (T2, N0) adenocarcinoma.  She had a thoracoscopic lingular segmentectomy back in June.  She did not require any adjuvant therapy.  She continues to have some incisional pain.  She says it is better than it was at her last visit but she is discouraged that it has not  improved more than it has.  She is on gabapentin 300 mg p.o. 3 times daily.  Increase that to 600 mg 3 times a day to see if that helps.  She also is using Advil as well.  Plan: Increase gabapentin to 600 mg 3 times daily Return in 6 weeks to check on progress  Melrose Nakayama, MD [facility] [phone]

## 2018-06-17 NOTE — Patient Instructions (Signed)
Increase gabapentin to 600 mg (2 tablets) three times daily

## 2018-06-27 ENCOUNTER — Encounter: Payer: Self-pay | Admitting: Internal Medicine

## 2018-07-08 ENCOUNTER — Ambulatory Visit: Payer: Self-pay

## 2018-07-08 NOTE — Telephone Encounter (Signed)
Returned call to pt to advise if flu shot should be administered after having cancer removed from her lung on 03/13/18.  She has had no chemo. Pt advised to contact Oncologist for approval. Note will be routed to PCP for recommendations  Reason for Disposition . [1] Follow-up call to recent contact AND [2] information only call, no triage required  Answer Assessment - Initial Assessment Questions 1. REASON FOR CALL or QUESTION: "What is your reason for calling today?" or "How can I best help you?" or "What question do you have that I can help answer?"  Can I get the flu shot after cancer removed from my lung June 13th  Protocols used: INFORMATION ONLY CALL-A-AH

## 2018-07-09 NOTE — Telephone Encounter (Signed)
Pt is Okay to receive the flu vaccine per Dr.Todd. Pt notified and verbalized understanding.

## 2018-07-10 ENCOUNTER — Other Ambulatory Visit: Payer: Self-pay

## 2018-07-10 DIAGNOSIS — J989 Respiratory disorder, unspecified: Secondary | ICD-10-CM

## 2018-07-10 DIAGNOSIS — R0989 Other specified symptoms and signs involving the circulatory and respiratory systems: Secondary | ICD-10-CM

## 2018-07-10 MED ORDER — ALBUTEROL SULFATE HFA 108 (90 BASE) MCG/ACT IN AERS: 2.0000 | INHALATION_SPRAY | Freq: Four times a day (QID) | RESPIRATORY_TRACT | 4 refills | Status: DC | PRN

## 2018-07-10 MED ORDER — BUDESONIDE-FORMOTEROL FUMARATE 160-4.5 MCG/ACT IN AERO: 2.0000 | INHALATION_SPRAY | Freq: Two times a day (BID) | RESPIRATORY_TRACT | 3 refills | Status: DC

## 2018-07-10 NOTE — Telephone Encounter (Signed)
Spoke to pt and advised her to look for a PCP for refills. Pt verbalized understanding. Inhalers refilled. Pt set up to see Dr.Koberlien.

## 2018-07-23 ENCOUNTER — Other Ambulatory Visit: Payer: Self-pay | Admitting: Internal Medicine

## 2018-07-29 ENCOUNTER — Ambulatory Visit (INDEPENDENT_AMBULATORY_CARE_PROVIDER_SITE_OTHER): Payer: BLUE CROSS/BLUE SHIELD | Admitting: Thoracic Surgery (Cardiothoracic Vascular Surgery)

## 2018-07-29 ENCOUNTER — Telehealth: Payer: Self-pay | Admitting: Medical Oncology

## 2018-07-29 VITALS — BP 122/64 | HR 76 | Resp 20 | Ht 65.0 in | Wt 159.0 lb

## 2018-07-29 DIAGNOSIS — M792 Neuralgia and neuritis, unspecified: Secondary | ICD-10-CM

## 2018-07-29 DIAGNOSIS — Z09 Encounter for follow-up examination after completed treatment for conditions other than malignant neoplasm: Secondary | ICD-10-CM

## 2018-07-29 NOTE — Telephone Encounter (Signed)
  CT scan appt. I gave pt the number for central scheduling. If she does not get an appt by end of jan call central scheduling.

## 2018-07-29 NOTE — Progress Notes (Signed)
[address]       [place] [address]             [phone]     HPI: [name] returns for a scheduled follow-up visit  [name] is a 62 year old woman with a history of tobacco abuse, COPD, treated hepatitis C, arthritis, depression, and anxiety.  She had a thoracoscopic lingular segmentectomy in June 2019 for a T2, N0, stage IB adenocarcinoma.  She did not require adjuvant therapy.  Her early postoperative course was uncomplicated.  When I saw her back at a month postop, she was still having some pain and we started gabapentin 300 mg 3 times a day.  I saw her again in September.  She continued to have pain consistent with intercostal neuralgias.  I increased her gabapentin to 600 mg 3 times daily.  She says that her pain is markedly improved since increasing the gabapentin.  She still has one spot posteriorly that hurts but she can manage with that.  She has been smoking 4 to 5 cigarettes a day.  She is very anxious and tearful about that. Past Medical History:  Diagnosis Date  . Anxiety   . 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 Bronchitis   . Depression   . Dyspnea   . Emphysema, unspecified ([place])    per patient mild case  . Family history of adverse reaction to anesthesia    patient states sister has a hard time waking up from anesthesia.   . Hepatitis C   . Tremor, unspecified    non-specific; takes inderal.      Current Outpatient Medications  Medication Sig Dispense Refill  . albuterol (PROVENTIL HFA;VENTOLIN HFA) 108 (90 Base) MCG/ACT inhaler Inhale 2 puffs into the lungs every 6 (six) hours as needed for shortness of breath. 18 g 4  . budesonide-formoterol (SYMBICORT) 160-4.5 MCG/ACT inhaler Inhale 2 puffs into the lungs 2 (two) times daily. 1 Inhaler 3  . gabapentin (NEURONTIN) 300 MG capsule Take 2 capsules (600 mg total) by mouth 3 (three) times daily. 180 capsule 1  . guaiFENesin (MUCINEX) 600 MG 12 hr tablet Take 1 tablet (600 mg total) by  mouth 2 (two) times daily as needed for cough or to loosen phlegm.    Marland Kitchen ibuprofen (ADVIL,MOTRIN) 200 MG tablet Take 400-600 mg by mouth daily as needed for mild pain (for pain.).     Marland Kitchen pantoprazole (PROTONIX) 20 MG tablet Take 1 tablet by mouth daily.  0  . PARoxetine (PAXIL) 20 MG tablet Take 1 tablet (20 mg total) by mouth daily. 90 tablet 3  . propranolol (INDERAL) 20 MG tablet Take 1 tablet (20 mg total) by mouth every evening. (1900) 90 tablet 1  . propranolol ER (INDERAL LA) 60 MG 24 hr capsule TAKE 1 CAPSULE BY MOUTH EVERY DAY 90 capsule 1  . fluticasone (FLONASE) 50 MCG/ACT nasal spray Place 1 spray into both nostrils 2 (two) times daily. 16 g 0   No current facility-administered medications for this visit.     Physical Exam BP 122/64   Pulse 76   Resp 20   Ht 5\' 5"  (1.651 m)   Wt 159 lb (72.1 kg)   SpO2 97% Comment: RA  BMI 26.69 kg/m  62 year old woman in no acute distress Alert and oriented x3 with no focal deficits Anxious Incisions well-healed Lungs clear bilaterally  Impression: [name] is a 62 year old woman who is now about 4 months out from a  thoracoscopic lingular segmentectomy for a stage Ib (T2, N0) adenocarcinoma.  She had some neuropathic type pain postoperatively.  That has improved since we increased her gabapentin to 600 mg 3 times a day.  Also likely is a component of improvement just with time as well, it is hard to sort that out.  For now we will keep her on the 600 mg of gabapentin 3 times daily.  Tobacco use-she has been smoking 4 to 5 cigarettes a day.  She understands the importance of cessation and seems committed to attempting to quit.  Plan: Follow-up in 4 to 5 months after CT done by Dr. Thad Ranger, MD [facility] [phone]

## 2018-08-09 NOTE — Progress Notes (Signed)
[name] DOB: [birth year] Encounter date: 08/11/2018  This is a 62 y.o. female who presents to establish care. Chief Complaint  Patient presents with  . New Patient (Initial Visit)    patient would like to start back on 60mg  inderall, states that the 20mg  tid is not working,     History of present illness: Went through Hepatitis C treatment earlier in year; and then with lung cancer so working through all of this recovery.   Had been doing 60mg  of the Inderall. Had been struggling with emphysema and SOB so they switched to 20mg  TID and cutting back. Hands shaking more, sob not better. Now taking 20mg  TID and wondering if she could switch back to 60mg  Inderall. Keeps her from shaking.   Tremor: helped with inderall. Not interferring as long as on medication.   Hep C: resolved with treatment in May. Seeing Dr. Linus Salmons  Emphysema: Using symbicort 2 puffs BID, albuterol 2 puffs q 6 hours or prn. Still has some SOB. Quit smoking day of surgery June 13th. Still struggles some days with this.   Paxil: started with separation from husband. Husband had health issues and then fell, hit head and had brain bleed so he moved back in with her. This has been quite a stress on her. Does see granddaughter every couple of weeks, whom she really enjoys. Doesn't have a huge support system. States she doesn't know how she is handling everything. Mother passed in Jan 2018 which was difficult for her. Feels "stuck". Worries about memory of her husband as well. Uses Olli sleep formulation which helps her sleep. He is on a list to get in home respit care. 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   After surgery was having very liquid stools but has not had those in over a week. Started on probiotic which she thinks has helped and has not had watery stool since starting. Hoping this is the "magic key".     Past Medical History:  Diagnosis Date  . Anxiety   . Arthritis of hand  04/17/2017  . Bronchitis   . Depression   . Dyspnea   . Emphysema, unspecified ([place])    per patient mild case  . Family history of adverse reaction to anesthesia    patient states sister has a hard time waking up from anesthesia.   . Hepatitis C   . Tremor, unspecified    non-specific; takes inderal.    Past Surgical History:  Procedure Laterality Date  . ABDOMINAL HYSTERECTOMY  1986   menorrhagia; hx of cryo for abnormal paps  . LOBECTOMY Left 03/13/2018   Procedure: Lingula section of left upper lobe lobectomy;  Surgeon: Melrose Nakayama, MD;  Location: [facility];  Service: Thoracic;  Laterality: Left;  Marland Kitchen VIDEO ASSISTED THORACOSCOPY (VATS)/WEDGE RESECTION Left 03/13/2018   Procedure: VIDEO ASSISTED THORACOSCOPY (VATS)/WEDGE RESECTION;  Surgeon: Melrose Nakayama, MD;  Location: [facility];  Service: Thoracic;  Laterality: Left;   Allergies  Allergen Reactions  . Aspirin Shortness Of Breath  . Morphine Sulfate Other (See Comments)    RED STREAKS ON ARMS.  Marland Kitchen Oxycodone Itching and Rash  . Glycopyrrolate     UNSPECIFIED REACTION  pt unsure if she is allergic.  . Lactose Intolerance (Gi) Nausea And Vomiting    Milk only.Patient states that she can eat cheese.  Marland Kitchen Penicillins Other (See Comments)     Heartburn, upset stomach only & tolerated AMOXIL after this reaction Has  patient had a PCN reaction causing immediate rash, facial/tongue/throat swelling, SOB or lightheadedness with hypotension: No Has patient had a PCN reaction causing severe rash involving mucus membranes or skin necrosis: No Has patient had a PCN reaction that required hospitalization: No Has patient had a PCN reaction occurring within the last 10 years: #  #  #  YES  #  #  #   . Sulfa Antibiotics Tinitus   Current Meds  Medication Sig  . albuterol (PROVENTIL HFA;VENTOLIN HFA) 108 (90 Base) MCG/ACT inhaler Inhale 2 puffs into the lungs every 6 (six) hours as needed for shortness of breath.  . budesonide-formoterol  (SYMBICORT) 160-4.5 MCG/ACT inhaler Inhale 2 puffs into the lungs 2 (two) times daily.  Marland Kitchen gabapentin (NEURONTIN) 300 MG capsule Take 2 capsules (600 mg total) by mouth 3 (three) times daily.  Marland Kitchen guaiFENesin (MUCINEX) 600 MG 12 hr tablet Take 1 tablet (600 mg total) by mouth 2 (two) times daily as needed for cough or to loosen phlegm.  Marland Kitchen ibuprofen (ADVIL,MOTRIN) 200 MG tablet Take 400-600 mg by mouth daily as needed for mild pain (for pain.).   Marland Kitchen pantoprazole (PROTONIX) 20 MG tablet Take 1 tablet by mouth daily.  Marland Kitchen PARoxetine (PAXIL) 20 MG tablet Take 1 tablet (20 mg total) by mouth daily.  . propranolol ER (INDERAL LA) 60 MG 24 hr capsule TAKE 1 CAPSULE BY MOUTH EVERY DAY  . [DISCONTINUED] propranolol (INDERAL) 20 MG tablet Take 1 tablet (20 mg total) by mouth every evening. (1900)   Social History   Tobacco Use  . Smoking status: Former Smoker    Packs/day: 0.25    Types: Cigarettes    Last attempt to quit: 03/13/2018    Years since quitting: 0.4  . Smokeless tobacco: Never Used  Substance Use Topics  . Alcohol use: Yes    Alcohol/week: 7.0 standard drinks    Types: 7 Shots of liquor per week    Comment: one margarita  every night   Family History  Problem Relation Age of Onset  . Asthma Mother   . Alzheimer's disease Mother 105  . Hypertension Mother   . Other Father        shot   . Neuropathy Sister        face  . Cancer Maternal Aunt        breast  . Cancer Paternal Aunt   . Cancer Paternal Grandmother        abdominal  . Lung cancer Paternal Uncle      Review of Systems  Constitutional: Negative for chills, fatigue and fever.  Respiratory: Positive for shortness of breath (feels she is getting somewhat better with this). Negative for cough, chest tightness and wheezing.   Cardiovascular: Negative for chest pain, palpitations and leg swelling.  Skin:       Dog scratches but these are healing. Gets them frequently from [place].    Objective:  BP 108/70   Pulse 70    Temp 98 F (36.7 C) (Oral)   Ht 5\' 5"  (1.651 m)   Wt 161 lb 1.6 oz (73.1 kg)   SpO2 95%   BMI 26.81 kg/m   Weight: 161 lb 1.6 oz (73.1 kg)   BP Readings from Last 3 Encounters:  08/11/18 108/70  07/29/18 122/64  06/17/18 124/80   Wt Readings from Last 3 Encounters:  08/11/18 161 lb 1.6 oz (73.1 kg)  07/29/18 159 lb (72.1 kg)  06/17/18 159 lb (72.1 kg)    Physical  Exam  Constitutional: She is oriented to person, place, and time. She appears well-developed and well-nourished. No distress.  Cardiovascular: Normal rate, regular rhythm and normal heart sounds. Exam reveals no friction rub.  No murmur heard. No lower extremity edema  Pulmonary/Chest: Effort normal. No respiratory distress. She has decreased breath sounds. She has no wheezes. She has no rales.  Neurological: She is alert and oriented to person, place, and time.  Psychiatric: She has a normal mood and affect. Her speech is normal and behavior is normal. Thought content normal. Cognition and memory are normal.    Assessment/Plan:  1. Diarrhea, unspecified type Improved; let me know if it restarts. Avoid dairy.  2. Simple chronic bronchitis (HCC) Stable. Continue current medications.  3. Tremor Continue inderal.  4. Hyperlipidemia, unspecified hyperlipidemia type Will recheck in Feb - Comprehensive metabolic panel; Future - Lipid panel; Future - TSH; Future  5. Cirrhosis of liver without ascites, unspecified hepatic cirrhosis type [facility]) Due for screening colonoscopy.  - Ambulatory referral to Gastroenterology  6. Screening for breast cancer - [facility] DIGITAL SCREENING BILATERAL; Future  7. Elevated blood pressure on initial check. Blood pressure stable on recheck. She does monitor at home.   Return in about 6 months (around 02/09/2019) for physical exam.  Micheline Rough, MD

## 2018-08-11 ENCOUNTER — Encounter: Payer: Self-pay | Admitting: Family Medicine

## 2018-08-11 ENCOUNTER — Ambulatory Visit (INDEPENDENT_AMBULATORY_CARE_PROVIDER_SITE_OTHER): Payer: BLUE CROSS/BLUE SHIELD | Admitting: Family Medicine

## 2018-08-11 VITALS — BP 108/70 | HR 70 | Temp 98.0°F | Ht 65.0 in | Wt 161.1 lb

## 2018-08-11 DIAGNOSIS — K7460 Unspecified cirrhosis of liver: Secondary | ICD-10-CM

## 2018-08-11 DIAGNOSIS — Z1239 Encounter for other screening for malignant neoplasm of breast: Secondary | ICD-10-CM

## 2018-08-11 DIAGNOSIS — R251 Tremor, unspecified: Secondary | ICD-10-CM | POA: Diagnosis not present

## 2018-08-11 DIAGNOSIS — E785 Hyperlipidemia, unspecified: Secondary | ICD-10-CM | POA: Diagnosis not present

## 2018-08-11 DIAGNOSIS — J410 Simple chronic bronchitis: Secondary | ICD-10-CM

## 2018-08-11 DIAGNOSIS — R197 Diarrhea, unspecified: Secondary | ICD-10-CM

## 2018-08-11 LAB — COMPREHENSIVE METABOLIC PANEL
ALT: 20 U/L (ref 0–35)
AST: 19 U/L (ref 0–37)
Albumin: 4.6 g/dL (ref 3.5–5.2)
Alkaline Phosphatase: 102 U/L (ref 39–117)
BUN: 17 mg/dL (ref 6–23)
CO2: 25 mEq/L (ref 19–32)
Calcium: 9.9 mg/dL (ref 8.4–10.5)
Chloride: 103 mEq/L (ref 96–112)
Creatinine, Ser: 0.79 mg/dL (ref 0.40–1.20)
GFR: 78.14 mL/min (ref >60.00)
Glucose, Bld: 102 mg/dL — ABNORMAL HIGH (ref 70–99)
Potassium: 4.2 mEq/L (ref 3.5–5.1)
Sodium: 139 mEq/L (ref 135–145)
Total Bilirubin: 0.7 mg/dL (ref 0.2–1.2)
Total Protein: 7.9 g/dL (ref 6.0–8.3)

## 2018-08-11 LAB — LIPID PANEL
Cholesterol: 245 mg/dL — ABNORMAL HIGH (ref 0–200)
HDL: 53.4 mg/dL (ref >39.00)
NonHDL: 191.88
Total CHOL/HDL Ratio: 5
Triglycerides: 342 mg/dL — ABNORMAL HIGH (ref 0.0–149.0)
VLDL: 68.4 mg/dL — ABNORMAL HIGH (ref 0.0–40.0)

## 2018-08-11 LAB — LDL CHOLESTEROL, DIRECT: Direct LDL: 173 mg/dL

## 2018-08-11 LAB — TSH: TSH: 2.78 u[IU]/mL (ref 0.35–4.50)

## 2018-08-11 NOTE — Addendum Note (Signed)
Addended by: Denna Haggard K on: 08/11/2018 09:00 AM   Modules accepted: Orders

## 2018-08-13 ENCOUNTER — Other Ambulatory Visit: Payer: Self-pay | Admitting: *Deleted

## 2018-08-15 ENCOUNTER — Encounter: Payer: Self-pay | Admitting: Gastroenterology

## 2018-09-10 ENCOUNTER — Ambulatory Visit: Payer: BLUE CROSS/BLUE SHIELD | Admitting: Gastroenterology

## 2018-09-12 ENCOUNTER — Other Ambulatory Visit: Payer: Self-pay

## 2018-09-12 MED ORDER — PROPRANOLOL HCL ER 60 MG PO CP24: 60.0000 mg | ORAL_CAPSULE | Freq: Every day | ORAL | 1 refills | Status: DC

## 2018-09-12 NOTE — Telephone Encounter (Signed)
Last filled 04/16/18 by Dr. Raliegh Ip, never filled by Dr. Ethlyn Gallery Last OV 08/11/18  Ok to fill?

## 2018-09-15 ENCOUNTER — Other Ambulatory Visit: Payer: Self-pay | Admitting: Thoracic Surgery (Cardiothoracic Vascular Surgery)

## 2018-10-14 ENCOUNTER — Telehealth: Payer: Self-pay

## 2018-10-14 NOTE — Telephone Encounter (Signed)
Noted patient to have missed [place] appointment and GI appointments. Patient states she had multiple cancer appointments and wished to set up her ultrasound on 11/07/18 (same day as scheduled CT at [facility]) Patient currently needs a new referral for CT scan. Will route message to Dr. Linus Salmons for new referral orders.  S.Amelita Risinger, LPN

## 2018-10-15 ENCOUNTER — Other Ambulatory Visit: Payer: Self-pay | Admitting: Internal Medicine

## 2018-10-15 DIAGNOSIS — K7460 Unspecified cirrhosis of liver: Secondary | ICD-10-CM

## 2018-10-15 NOTE — Telephone Encounter (Signed)
Margaret B. Notified of referral for ultrasound now and another in 6 months per Dr. Linus Salmons. Patient would prefer her ultrasound be coordinated at [facility] at the time of her CT scan.  S.Jameek Bruntz, LPN

## 2018-10-15 NOTE — Telephone Encounter (Signed)
Ultrasound ordered, also one for 6 months from now  thanks

## 2018-11-07 ENCOUNTER — Ambulatory Visit (HOSPITAL_COMMUNITY)
Admission: RE | Admit: 2018-11-07 | Discharge: 2018-11-07 | Disposition: A | Discharge status: Home / Self Care | Payer: BLUE CROSS/BLUE SHIELD | Source: Ambulatory Visit | Attending: Internal Medicine | Admitting: Internal Medicine

## 2018-11-07 ENCOUNTER — Inpatient Hospital Stay: Payer: BLUE CROSS/BLUE SHIELD | Attending: Internal Medicine

## 2018-11-07 ENCOUNTER — Encounter (HOSPITAL_COMMUNITY): Payer: Self-pay

## 2018-11-07 DIAGNOSIS — C3412 Malignant neoplasm of upper lobe, left bronchus or lung: Secondary | ICD-10-CM | POA: Insufficient documentation

## 2018-11-07 DIAGNOSIS — J439 Emphysema, unspecified: Secondary | ICD-10-CM | POA: Diagnosis not present

## 2018-11-07 DIAGNOSIS — R251 Tremor, unspecified: Secondary | ICD-10-CM | POA: Insufficient documentation

## 2018-11-07 DIAGNOSIS — F329 Major depressive disorder, single episode, unspecified: Secondary | ICD-10-CM | POA: Insufficient documentation

## 2018-11-07 DIAGNOSIS — K7460 Unspecified cirrhosis of liver: Secondary | ICD-10-CM | POA: Insufficient documentation

## 2018-11-07 DIAGNOSIS — C3490 Malignant neoplasm of unspecified part of unspecified bronchus or lung: Secondary | ICD-10-CM

## 2018-11-07 DIAGNOSIS — Z79899 Other long term (current) drug therapy: Secondary | ICD-10-CM | POA: Diagnosis not present

## 2018-11-07 DIAGNOSIS — F17210 Nicotine dependence, cigarettes, uncomplicated: Secondary | ICD-10-CM | POA: Insufficient documentation

## 2018-11-07 DIAGNOSIS — Z791 Long term (current) use of non-steroidal anti-inflammatories (NSAID): Secondary | ICD-10-CM | POA: Insufficient documentation

## 2018-11-07 LAB — CMP (CANCER CENTER ONLY)
ALT: 19 U/L (ref 0–44)
AST: 21 U/L (ref 15–41)
Albumin: 4.3 g/dL (ref 3.5–5.0)
Alkaline Phosphatase: 106 U/L (ref 38–126)
Anion gap: 11 (ref 5–15)
BUN: 16 mg/dL (ref 8–23)
CO2: 25 mmol/L (ref 22–32)
Calcium: 10.1 mg/dL (ref 8.9–10.3)
Chloride: 107 mmol/L (ref 98–111)
Creatinine: 0.95 mg/dL (ref 0.44–1.00)
GFR, Est AFR Am: >60 mL/min (ref >60)
GFR, Estimated: >60 mL/min (ref >60)
Glucose, Bld: 109 mg/dL — ABNORMAL HIGH (ref 70–99)
Potassium: 5 mmol/L (ref 3.5–5.1)
Sodium: 143 mmol/L (ref 135–145)
Total Bilirubin: 1 mg/dL (ref 0.3–1.2)
Total Protein: 8 g/dL (ref 6.5–8.1)

## 2018-11-07 LAB — CBC WITH DIFFERENTIAL (CANCER CENTER ONLY)
Abs Immature Granulocytes: 0.02 10*3/uL (ref 0.00–0.07)
Basophils Absolute: 0.1 10*3/uL (ref 0.0–0.1)
Basophils Relative: 1 %
Eosinophils Absolute: 0.1 10*3/uL (ref 0.0–0.5)
Eosinophils Relative: 2 %
HCT: 49.2 % — ABNORMAL HIGH (ref 36.0–46.0)
Hemoglobin: 16 g/dL — ABNORMAL HIGH (ref 12.0–15.0)
Immature Granulocytes: 0 %
Lymphocytes Relative: 21 %
Lymphs Abs: 1.5 10*3/uL (ref 0.7–4.0)
MCH: 28.7 pg (ref 26.0–34.0)
MCHC: 32.5 g/dL (ref 30.0–36.0)
MCV: 88.3 fL (ref 80.0–100.0)
Monocytes Absolute: 0.6 10*3/uL (ref 0.1–1.0)
Monocytes Relative: 8 %
Neutro Abs: 4.8 10*3/uL (ref 1.7–7.7)
Neutrophils Relative %: 68 %
Platelet Count: 276 10*3/uL (ref 150–400)
RBC: 5.57 MIL/uL — ABNORMAL HIGH (ref 3.87–5.11)
RDW: 12.9 % (ref 11.5–15.5)
WBC Count: 7 10*3/uL (ref 4.0–10.5)
nRBC: 0 % (ref 0.0–0.2)

## 2018-11-07 MED ORDER — IOHEXOL 300 MG/ML  SOLN
75.0000 mL | Freq: Once | INTRAMUSCULAR | Status: AC | PRN
  Administered 2018-11-07: 75 mL via INTRAVENOUS

## 2018-11-07 MED ORDER — SODIUM CHLORIDE (PF) 0.9 % IJ SOLN
INTRAMUSCULAR | Status: AC
  Filled 2018-11-07: qty 50

## 2018-11-11 ENCOUNTER — Encounter: Payer: Self-pay | Admitting: Internal Medicine

## 2018-11-11 ENCOUNTER — Inpatient Hospital Stay (HOSPITAL_BASED_OUTPATIENT_CLINIC_OR_DEPARTMENT_OTHER): Payer: BLUE CROSS/BLUE SHIELD | Admitting: Internal Medicine

## 2018-11-11 VITALS — BP 135/99 | HR 75 | Temp 98.3°F | Resp 18 | Ht 65.0 in | Wt 154.6 lb

## 2018-11-11 DIAGNOSIS — R251 Tremor, unspecified: Secondary | ICD-10-CM | POA: Diagnosis not present

## 2018-11-11 DIAGNOSIS — F17210 Nicotine dependence, cigarettes, uncomplicated: Secondary | ICD-10-CM | POA: Diagnosis not present

## 2018-11-11 DIAGNOSIS — C3412 Malignant neoplasm of upper lobe, left bronchus or lung: Secondary | ICD-10-CM | POA: Diagnosis not present

## 2018-11-11 DIAGNOSIS — J439 Emphysema, unspecified: Secondary | ICD-10-CM

## 2018-11-11 DIAGNOSIS — C3490 Malignant neoplasm of unspecified part of unspecified bronchus or lung: Secondary | ICD-10-CM

## 2018-11-11 DIAGNOSIS — F17200 Nicotine dependence, unspecified, uncomplicated: Secondary | ICD-10-CM

## 2018-11-11 DIAGNOSIS — Z79899 Other long term (current) drug therapy: Secondary | ICD-10-CM

## 2018-11-11 DIAGNOSIS — Z791 Long term (current) use of non-steroidal anti-inflammatories (NSAID): Secondary | ICD-10-CM

## 2018-11-11 NOTE — Progress Notes (Signed)
[place] Telephone:(336) [phone]   Fax:(336) [phone]  OFFICE PROGRESS NOTE  [name], MD [place] [address]  DIAGNOSIS: Stage Ib (T2, N0, M0) non-small cell lung cancer, adenocarcinoma   PRIOR THERAPY: status post wedge resection of the left upper lobe nodule in addition to lingular segmentectomy and mediastinal lymph node sampling under the care of Dr. Roxan Hockey on March 13, 2018.  CURRENT THERAPY: Observation.  INTERVAL HISTORY: [name] 63 y.o. female returns to the clinic today for follow-up visit accompanied by her daughter.  The patient is feeling fine today with no concerning complaints except for shortness of breath with exertion.  She denied having any chest pain, cough or hemoptysis.  Unfortunately she continues to smoke 0.5 pack of cigarettes every day.  She denied having any nausea, vomiting, diarrhea or constipation.  She denied having any weight loss or night sweats.  She has no headache or visual changes.  She had repeat CT scan of the chest performed recently and she is here for evaluation and discussion of her scan results.  MEDICAL HISTORY: Past Medical History:  Diagnosis Date  . Anxiety   . 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 Bronchitis   . Depression   . Dyspnea   . Emphysema, unspecified ([place])    per patient mild case  . Family history of adverse reaction to anesthesia    patient states sister has a hard time waking up from anesthesia.   . Hepatitis C   . Tremor, unspecified    non-specific; takes inderal.     ALLERGIES:  is allergic to aspirin; morphine sulfate; oxycodone; glycopyrrolate; lactose intolerance (gi); penicillins; and sulfa antibiotics.  MEDICATIONS:  Current Outpatient Medications  Medication Sig Dispense Refill  . albuterol (PROVENTIL HFA;VENTOLIN HFA) 108 (90 Base) MCG/ACT inhaler Inhale 2 puffs into the lungs every 6 (six) hours as needed for shortness of breath. 18 g 4  .  budesonide-formoterol (SYMBICORT) 160-4.5 MCG/ACT inhaler Inhale 2 puffs into the lungs 2 (two) times daily. 1 Inhaler 3  . fluticasone (FLONASE) 50 MCG/ACT nasal spray Place 1 spray into both nostrils 2 (two) times daily. 16 g 0  . gabapentin (NEURONTIN) 300 MG capsule TAKE 2 CAPSULES BY MOUTH THREE TIMES DAILY 180 capsule 0  . guaiFENesin (MUCINEX) 600 MG 12 hr tablet Take 1 tablet (600 mg total) by mouth 2 (two) times daily as needed for cough or to loosen phlegm.    Marland Kitchen ibuprofen (ADVIL,MOTRIN) 200 MG tablet Take 400-600 mg by mouth daily as needed for mild pain (for pain.).     Marland Kitchen pantoprazole (PROTONIX) 20 MG tablet Take 1 tablet by mouth daily.  0  . PARoxetine (PAXIL) 20 MG tablet Take 1 tablet (20 mg total) by mouth daily. 90 tablet 3  . propranolol ER (INDERAL LA) 60 MG 24 hr capsule Take 1 capsule (60 mg total) by mouth daily. 90 capsule 1   No current facility-administered medications for this visit.     SURGICAL HISTORY:  Past Surgical History:  Procedure Laterality Date  . ABDOMINAL HYSTERECTOMY  1986   menorrhagia; hx of cryo for abnormal paps  . LOBECTOMY Left 03/13/2018   Procedure: Lingula section of left upper lobe lobectomy;  Surgeon: Melrose Nakayama, MD;  Location: [facility];  Service: Thoracic;  Laterality: Left;  Marland Kitchen VIDEO ASSISTED THORACOSCOPY (VATS)/WEDGE RESECTION Left 03/13/2018   Procedure: VIDEO ASSISTED THORACOSCOPY (VATS)/WEDGE RESECTION;  Surgeon: Melrose Nakayama, MD;  Location:  [facility];  Service: Thoracic;  Laterality: Left;    REVIEW OF SYSTEMS:  A comprehensive review of systems was negative except for: Respiratory: positive for dyspnea on exertion   PHYSICAL EXAMINATION: General appearance: alert, cooperative and no distress Head: Normocephalic, without obvious abnormality, atraumatic Neck: no adenopathy, no JVD, supple, symmetrical, trachea midline and thyroid not enlarged, symmetric, no tenderness/mass/nodules Lymph nodes: Cervical, supraclavicular,  and axillary nodes normal. Resp: clear to auscultation bilaterally Back: symmetric, no curvature. ROM normal. No CVA tenderness. Cardio: regular rate and rhythm, S1, S2 normal, no murmur, click, rub or gallop GI: soft, non-tender; bowel sounds normal; no masses,  no organomegaly Extremities: extremities normal, atraumatic, no cyanosis or edema  ECOG PERFORMANCE STATUS: 1 - Symptomatic but completely ambulatory  Blood pressure (!) 135/99, pulse 75, temperature 98.3 F (36.8 C), temperature source Oral, resp. rate 18, height 5\' 5"  (1.651 m), weight 154 lb 9.6 oz (70.1 kg), SpO2 95 %.  LABORATORY DATA: Lab Results  Component Value Date   WBC 7.0 11/07/2018   HGB 16.0 (H) 11/07/2018   HCT 49.2 (H) 11/07/2018   MCV 88.3 11/07/2018   PLT 276 11/07/2018      Chemistry      Component Value Date/Time   NA 143 11/07/2018 1013   K 5.0 11/07/2018 1013   CL 107 11/07/2018 1013   CO2 25 11/07/2018 1013   BUN 16 11/07/2018 1013   CREATININE 0.95 11/07/2018 1013   CREATININE 0.86 05/20/2018 1737      Component Value Date/Time   CALCIUM 10.1 11/07/2018 1013   ALKPHOS 106 11/07/2018 1013   AST 21 11/07/2018 1013   ALT 19 11/07/2018 1013   ALT 20 10/10/2017 1653   BILITOT 1.0 11/07/2018 1013       RADIOGRAPHIC STUDIES: Ct Chest W Contrast  Result Date: 11/07/2018 CLINICAL DATA:  Lung cancer, diagnosed 2019, status post surgery, shortness of breath EXAM: CT CHEST WITH CONTRAST TECHNIQUE: Multidetector CT imaging of the chest was performed during intravenous contrast administration. CONTRAST:  27mL OMNIPAQUE IOHEXOL 300 MG/ML  SOLN COMPARISON:  PET-CT dated 02/19/2018 FINDINGS: Cardiovascular: Heart is normal in size.  No pericardial effusion. No evidence of thoracic aortic aneurysm. Very mild atherosclerotic calcifications of the aortic arch. Mediastinum/Nodes: Small mediastinal lymph nodes, including a 6 mm short axis low right paratracheal node. Visualized thyroid is unremarkable.  Lungs/Pleura: Status post left upper lobe wedge resection. 7 mm ground-glass nodule in the posterior right upper lobe (series 7/image 61), unchanged. Mild subpleural nodularity in the right upper lobe measuring 3 mm (series 7/image 63), unchanged. 9 x 5 mm irregular solid nodule in the posterior right lower lobe (series 7/image 67), previously 6 mm. Mild centrilobular and paraseptal emphysematous changes. Biapical pleural-parenchymal scarring. No focal consolidation. No pleural effusion or pneumothorax. Upper Abdomen: Visualized upper abdomen is grossly unremarkable, noting vascular calcifications. Musculoskeletal: Mild degenerative changes of the visualized thoracolumbar spine. IMPRESSION: Status post left upper lobe wedge resection. No findings specific for recurrent or metastatic disease. 9 x 5 mm irregular solid nodule in the posterior right lower lobe is mildly progressive, raising the possibility of metachronous primary bronchogenic neoplasm. Given slow progression, follow-up CT chest is suggested in 6 months. Aortic Atherosclerosis (ICD10-I70.0) and Emphysema (ICD10-J43.9). Electronically Signed   By: Julian Hy M.D.   On: 11/07/2018 15:18   US Abdomen Limited Ruq  Result Date: 11/07/2018 CLINICAL DATA:  Hepatitis C EXAM: ULTRASOUND ABDOMEN LIMITED RIGHT UPPER QUADRANT COMPARISON:  10/25/2017 FINDINGS: Gallbladder: Gallbladder is well distended. No  gallstones are identified. No wall thickening is seen. Common bile duct: Diameter: 3.7 mm. Liver: Mild increased echogenicity is noted with mild nodularity consistent with underlying cirrhosis. No definitive mass is seen. Portal vein is patent on color Doppler imaging with normal direction of blood flow towards the liver. IMPRESSION: Mild changes of cirrhosis stable from the previous ultrasound. No mass lesion is noted. Electronically Signed   By: Inez Catalina M.D.   On: 11/07/2018 15:04    ASSESSMENT AND PLAN: This is a very pleasant 63 years old  white female diagnosed with a stage Ib non-small cell lung cancer, adenocarcinoma status post wedge resection of the left upper lobe lung nodule in addition to lingular segmentectomy and mediastinal lymph node dissection in June 2019. The patient is currently on observation and she is feeling fine except for shortness of breath with exertion.  She had repeat CT scan of the chest performed recently.  I personally and independently reviewed the scan images and discussed the results with the patient and her daughter. Her scan showed no concerning findings for disease progression but there was enlarging right lower lobe solid pulmonary nodule suspicious for metachronous primary bronchogenic neoplasm. I recommended for the patient to have repeat CT scan of the chest in 3 months for further evaluation of this lesion. For smoking cessation I strongly encouraged the patient to quit smoking and offered her a smoke cessation program. The patient was advised to call immediately if she has any concerning symptoms in the interval. The patient voices understanding of current disease status and treatment options and is in agreement with the current care plan.  All questions were answered. The patient knows to call the clinic with any problems, questions or concerns. We can certainly see the patient much sooner if necessary.  I spent 10 minutes counseling the patient face to face. The total time spent in the appointment was 15 minutes.  Disclaimer: This note was dictated with voice recognition software. Similar sounding words can inadvertently be transcribed and may not be corrected upon review.

## 2018-11-21 ENCOUNTER — Other Ambulatory Visit: Payer: Self-pay | Admitting: Thoracic Surgery (Cardiothoracic Vascular Surgery)

## 2018-11-24 ENCOUNTER — Telehealth: Payer: Self-pay

## 2018-11-24 NOTE — Telephone Encounter (Signed)
-----   Message from Melrose Nakayama, MD sent at 11/24/2018 10:40 AM EST ----- Thanks ----- Message ----- From: Donnella Sham, RN Sent: 11/24/2018   9:19 AM EST To: Melrose Nakayama, MD  Patient requesting refill of Gabapentin which was ordered originally by you.  She is due for a follow-up next month.  Added one more refill to get her through to her next appointment.  Just Fyi.  Thanks,  Caryl Pina

## 2018-12-23 ENCOUNTER — Ambulatory Visit: Payer: BLUE CROSS/BLUE SHIELD | Admitting: Thoracic Surgery (Cardiothoracic Vascular Surgery)

## 2019-02-06 ENCOUNTER — Ambulatory Visit (HOSPITAL_COMMUNITY): Admission: RE | Admit: 2019-02-06 | Payer: BLUE CROSS/BLUE SHIELD | Source: Ambulatory Visit

## 2019-02-06 ENCOUNTER — Encounter (HOSPITAL_COMMUNITY): Payer: Self-pay

## 2019-02-06 ENCOUNTER — Ambulatory Visit (HOSPITAL_COMMUNITY)
Admission: RE | Admit: 2019-02-06 | Discharge: 2019-02-06 | Disposition: A | Discharge status: Home / Self Care | Payer: BLUE CROSS/BLUE SHIELD | Source: Ambulatory Visit | Attending: Internal Medicine | Admitting: Internal Medicine

## 2019-02-06 ENCOUNTER — Other Ambulatory Visit: Payer: Self-pay

## 2019-02-06 ENCOUNTER — Inpatient Hospital Stay: Payer: BLUE CROSS/BLUE SHIELD | Attending: Internal Medicine

## 2019-02-06 DIAGNOSIS — J439 Emphysema, unspecified: Secondary | ICD-10-CM | POA: Insufficient documentation

## 2019-02-06 DIAGNOSIS — Z79899 Other long term (current) drug therapy: Secondary | ICD-10-CM | POA: Insufficient documentation

## 2019-02-06 DIAGNOSIS — C3490 Malignant neoplasm of unspecified part of unspecified bronchus or lung: Secondary | ICD-10-CM | POA: Insufficient documentation

## 2019-02-06 DIAGNOSIS — R110 Nausea: Secondary | ICD-10-CM | POA: Insufficient documentation

## 2019-02-06 DIAGNOSIS — Z7951 Long term (current) use of inhaled steroids: Secondary | ICD-10-CM | POA: Insufficient documentation

## 2019-02-06 DIAGNOSIS — C3412 Malignant neoplasm of upper lobe, left bronchus or lung: Secondary | ICD-10-CM | POA: Insufficient documentation

## 2019-02-06 DIAGNOSIS — Z791 Long term (current) use of non-steroidal anti-inflammatories (NSAID): Secondary | ICD-10-CM | POA: Insufficient documentation

## 2019-02-06 LAB — CMP (CANCER CENTER ONLY)
ALT: 15 U/L (ref 0–44)
AST: 16 U/L (ref 15–41)
Albumin: 3.9 g/dL (ref 3.5–5.0)
Alkaline Phosphatase: 104 U/L (ref 38–126)
Anion gap: 11 (ref 5–15)
BUN: 15 mg/dL (ref 8–23)
CO2: 21 mmol/L — ABNORMAL LOW (ref 22–32)
Calcium: 9.3 mg/dL (ref 8.9–10.3)
Chloride: 108 mmol/L (ref 98–111)
Creatinine: 0.85 mg/dL (ref 0.44–1.00)
GFR, Est AFR Am: >60 mL/min (ref >60)
GFR, Estimated: >60 mL/min (ref >60)
Glucose, Bld: 103 mg/dL — ABNORMAL HIGH (ref 70–99)
Potassium: 4.3 mmol/L (ref 3.5–5.1)
Sodium: 140 mmol/L (ref 135–145)
Total Bilirubin: 0.8 mg/dL (ref 0.3–1.2)
Total Protein: 7.6 g/dL (ref 6.5–8.1)

## 2019-02-06 LAB — CBC WITH DIFFERENTIAL (CANCER CENTER ONLY)
Abs Immature Granulocytes: 0.03 10*3/uL (ref 0.00–0.07)
Basophils Absolute: 0.1 10*3/uL (ref 0.0–0.1)
Basophils Relative: 1 %
Eosinophils Absolute: 0.2 10*3/uL (ref 0.0–0.5)
Eosinophils Relative: 4 %
HCT: 49.2 % — ABNORMAL HIGH (ref 36.0–46.0)
Hemoglobin: 16.7 g/dL — ABNORMAL HIGH (ref 12.0–15.0)
Immature Granulocytes: 0 %
Lymphocytes Relative: 28 %
Lymphs Abs: 1.9 10*3/uL (ref 0.7–4.0)
MCH: 29.2 pg (ref 26.0–34.0)
MCHC: 33.9 g/dL (ref 30.0–36.0)
MCV: 86.2 fL (ref 80.0–100.0)
Monocytes Absolute: 0.8 10*3/uL (ref 0.1–1.0)
Monocytes Relative: 12 %
Neutro Abs: 3.7 10*3/uL (ref 1.7–7.7)
Neutrophils Relative %: 55 %
Platelet Count: 287 10*3/uL (ref 150–400)
RBC: 5.71 MIL/uL — ABNORMAL HIGH (ref 3.87–5.11)
RDW: 12.2 % (ref 11.5–15.5)
WBC Count: 6.8 10*3/uL (ref 4.0–10.5)
nRBC: 0 % (ref 0.0–0.2)

## 2019-02-06 MED ORDER — IOHEXOL 300 MG/ML  SOLN
75.0000 mL | Freq: Once | INTRAMUSCULAR | Status: AC | PRN
  Administered 2019-02-06: 75 mL via INTRAVENOUS

## 2019-02-06 MED ORDER — SODIUM CHLORIDE (PF) 0.9 % IJ SOLN
INTRAMUSCULAR | Status: AC
  Filled 2019-02-06: qty 50

## 2019-02-10 ENCOUNTER — Inpatient Hospital Stay (HOSPITAL_BASED_OUTPATIENT_CLINIC_OR_DEPARTMENT_OTHER): Payer: BLUE CROSS/BLUE SHIELD | Admitting: Internal Medicine

## 2019-02-10 ENCOUNTER — Encounter: Payer: Self-pay | Admitting: Internal Medicine

## 2019-02-10 ENCOUNTER — Other Ambulatory Visit: Payer: Self-pay

## 2019-02-10 DIAGNOSIS — Z79899 Other long term (current) drug therapy: Secondary | ICD-10-CM | POA: Diagnosis not present

## 2019-02-10 DIAGNOSIS — C3490 Malignant neoplasm of unspecified part of unspecified bronchus or lung: Secondary | ICD-10-CM

## 2019-02-10 NOTE — Progress Notes (Signed)
[facility] [place] Telephone:(336) [phone]   Fax:([phone]  PROGRESS NOTE FOR TELEMEDICINE VISITS  [name], MD [place] [address]  I connected with@ on 02/10/19 at 11:00 AM EDT by telephone visit and verified that I am speaking with the correct person using two identifiers.   I discussed the limitations, risks, security and privacy concerns of performing an evaluation and management service by telemedicine and the availability of in-person appointments. I also discussed with the patient that there may be a patient responsible charge related to this service. The patient expressed understanding and agreed to proceed.  Other persons participating in the visit and their role in the encounter:  None  Patient's location:  Home Provider's location: [place].  DIAGNOSIS: Stage Ib (T2, N0, M0) non-small cell lung cancer, adenocarcinoma  PRIOR THERAPY: status post wedge resection of the left upper lobe nodule in addition to lingular segmentectomy and mediastinal lymph node sampling under the care of Dr. Roxan Hockey on March 13, 2018.  CURRENT THERAPY: Observation.  INTERVAL HISTORY: [name] 63 y.o. female has a telephone virtual visit with me today for evaluation and discussion of her scan results.  The patient is feeling fine today with no concerning complaints except for migraine headache with mild nausea.  She denied having any chest pain, shortness of breath, cough or hemoptysis.  She denied having any fever or chills.  She has no recent weight loss or night sweats.  The patient had repeat CT scan of the chest performed recently and we are having the visit for evaluation and discussion of her scan results. 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    Hepatitis C    Tremor, unspecified    non-specific; takes inderal.     ALLERGIES:  is allergic to aspirin; morphine sulfate; oxycodone; glycopyrrolate; lactose intolerance (gi); penicillins; and sulfa antibiotics.  MEDICATIONS:  Current Outpatient Medications  Medication Sig Dispense Refill   albuterol (PROVENTIL HFA;VENTOLIN HFA) 108 (90 Base) MCG/ACT inhaler Inhale 2 puffs into the lungs every 6 (six) hours as needed for shortness of breath. 18 g 4   budesonide-formoterol (SYMBICORT) 160-4.5 MCG/ACT inhaler Inhale 2 puffs into the lungs 2 (two) times daily. 1 Inhaler 3   fluticasone (FLONASE) 50 MCG/ACT nasal spray Place 1 spray into both nostrils 2 (two) times daily. 16 g 0   gabapentin (NEURONTIN) 300 MG capsule TAKE 2 CAPSULES BY MOUTH THREE TIMES DAILY 180 capsule 0   guaiFENesin (MUCINEX) 600 MG 12 hr tablet Take 1 tablet (600 mg total) by mouth 2 (two) times daily as needed for cough or to loosen phlegm.     ibuprofen (ADVIL,MOTRIN) 200 MG tablet Take 400-600 mg by mouth daily as needed for mild pain (for pain.).      pantoprazole (PROTONIX) 20 MG tablet Take 1 tablet by mouth daily.  0   PARoxetine (PAXIL) 20 MG tablet Take 1 tablet (20 mg total) by mouth daily. 90 tablet 3   propranolol ER (INDERAL LA) 60 MG 24 hr capsule Take 1 capsule (60 mg total) by mouth daily. 90 capsule 1   No current facility-administered medications  for this visit.     SURGICAL HISTORY:  Past Surgical History:  Procedure Laterality Date   ABDOMINAL HYSTERECTOMY  1986   menorrhagia; hx of cryo for abnormal paps   LOBECTOMY Left 03/13/2018   Procedure: Lingula section of left upper lobe lobectomy;  Surgeon: Melrose Nakayama, MD;  Location: [facility];  Service: Thoracic;  Laterality: Left;   VIDEO ASSISTED THORACOSCOPY (VATS)/WEDGE RESECTION Left 03/13/2018   Procedure: VIDEO  ASSISTED THORACOSCOPY (VATS)/WEDGE RESECTION;  Surgeon: Melrose Nakayama, MD;  Location: [facility];  Service: Thoracic;  Laterality: Left;    REVIEW OF SYSTEMS:  A comprehensive review of systems was negative except for: Neurological: positive for headaches    LABORATORY DATA: Lab Results  Component Value Date   WBC 6.8 02/06/2019   HGB 16.7 (H) 02/06/2019   HCT 49.2 (H) 02/06/2019   MCV 86.2 02/06/2019   PLT 287 02/06/2019      Chemistry      Component Value Date/Time   NA 140 02/06/2019 0822   K 4.3 02/06/2019 0822   CL 108 02/06/2019 0822   CO2 21 (L) 02/06/2019 0822   BUN 15 02/06/2019 0822   CREATININE 0.85 02/06/2019 0822   CREATININE 0.86 05/20/2018 1737      Component Value Date/Time   CALCIUM 9.3 02/06/2019 0822   ALKPHOS 104 02/06/2019 0822   AST 16 02/06/2019 0822   ALT 15 02/06/2019 0822   ALT 20 10/10/2017 1653   BILITOT 0.8 02/06/2019 0822       RADIOGRAPHIC STUDIES: Ct Chest W Contrast  Result Date: 02/06/2019 CLINICAL DATA:  Lung cancer EXAM: CT CHEST WITH CONTRAST TECHNIQUE: Multidetector CT imaging of the chest was performed during intravenous contrast administration. CONTRAST:  41mL OMNIPAQUE IOHEXOL 300 MG/ML  SOLN COMPARISON:  11/07/2018, PET-CT, 02/19/2018 FINDINGS: Cardiovascular: Mixed aortic atherosclerosis. Normal heart size. No pericardial effusion. Mediastinum/Nodes: Stable appearance of soft tissue about the left hilum. Thyroid gland, trachea, and esophagus demonstrate no significant findings. Lungs/Pleura: Moderate centrilobular emphysema. Status post left upper lobe and left lower lobe wedge resections. Stable right upper lobe ground-glass nodule (series 5, image 53) and right lower lobe solid nodule (series 5, image 63). No pleural effusion or pneumothorax. Upper Abdomen: No acute abnormality. Musculoskeletal: No chest wall mass or suspicious bone lesions identified. IMPRESSION: 1. Status post left upper lobe and left lower lobe wedge  resections. Stable right upper lobe ground-glass nodule (series 5, image 53) and right lower lobe solid nodule (series 5, image 63). Attention on follow-up. 2.  Stable appearance of soft tissue about the left hilum. 3.  Emphysema. Electronically Signed   By: Eddie Candle M.D.   On: 02/06/2019 10:18    ASSESSMENT AND PLAN: This is a very pleasant 63 years old white female diagnosed with a stage Ib non-small cell lung cancer, adenocarcinoma status post wedge resection of the left upper lobe lung nodule in addition to lingular segmentectomy and mediastinal lymph node dissection in June 2019. She is currently on observation and she is doing fine. She had repeat CT scan of the chest performed recently.  I personally and independently reviewed the scans and discussed the result with the patient today. Her scan showed no concerning findings for disease progression but she continues to have groundglass nodules that need close observation. I recommended for the patient to continue on observation for now with repeat CT scan of the chest in 6 months. She was advised to call immediately if she has any concerning symptoms in the  interval. I discussed the assessment and treatment plan with the patient. The patient was provided an opportunity to ask questions and all were answered. The patient agreed with the plan and demonstrated an understanding of the instructions.   The patient was advised to call back or seek an in-person evaluation if the symptoms worsen or if the condition fails to improve as anticipated.  I provided 11 minutes of non face-to-face telephone visit time during this encounter, and > 50% was spent counseling as documented under my assessment & plan.  Eilleen Kempf, MD 02/10/2019 11:09 AM  Disclaimer: This note was dictated with voice recognition software. Similar sounding words can inadvertently be transcribed and may not be corrected upon review.

## 2019-02-11 ENCOUNTER — Telehealth: Payer: Self-pay | Admitting: Internal Medicine

## 2019-02-11 NOTE — Telephone Encounter (Signed)
Scheduled appt per 5/12 los - letter mailed with appt date and time

## 2019-02-12 ENCOUNTER — Telehealth: Payer: Self-pay | Admitting: *Deleted

## 2019-02-12 NOTE — Telephone Encounter (Signed)
Appt calendar faxed

## 2019-02-22 ENCOUNTER — Encounter: Payer: Self-pay | Admitting: Family Medicine

## 2019-02-25 ENCOUNTER — Telehealth: Payer: Self-pay

## 2019-02-25 ENCOUNTER — Other Ambulatory Visit: Payer: Self-pay | Admitting: Family Medicine

## 2019-02-25 MED ORDER — ESZOPICLONE 3 MG PO TABS: 3.0000 mg | ORAL_TABLET | Freq: Every day | ORAL | 2 refills | Status: DC

## 2019-02-25 NOTE — Telephone Encounter (Signed)
Copied from [place] [id number]. Topic: General - Other >> Feb 25, 2019 12:50 PM Mcneil, Ja-Kwan wrote: Reason for CRM: Pt stated she has not been sleeping well and she would like to know if she can get another Rx for Lunesta. Pt requests call back

## 2019-02-25 NOTE — Telephone Encounter (Signed)
I called the pt and informed her of the message below.

## 2019-02-25 NOTE — Telephone Encounter (Signed)
I sent in refill for her

## 2019-03-26 ENCOUNTER — Other Ambulatory Visit: Payer: Self-pay | Admitting: Thoracic Surgery (Cardiothoracic Vascular Surgery)

## 2019-04-09 ENCOUNTER — Telehealth: Payer: Self-pay | Admitting: *Deleted

## 2019-04-09 NOTE — Telephone Encounter (Signed)
[facility] faxed a refill request for Symbicort 160-4.92mcg aer -inhale 2 puffs into the lungs twice daily.  Message forwarded to Dr Ethlyn Gallery as the rx was given by Dr Burnice Logan.

## 2019-04-10 ENCOUNTER — Other Ambulatory Visit: Payer: Self-pay | Admitting: Family Medicine

## 2019-04-10 DIAGNOSIS — R0989 Other specified symptoms and signs involving the circulatory and respiratory systems: Secondary | ICD-10-CM

## 2019-04-10 DIAGNOSIS — J989 Respiratory disorder, unspecified: Secondary | ICD-10-CM

## 2019-04-10 MED ORDER — BUDESONIDE-FORMOTEROL FUMARATE 160-4.5 MCG/ACT IN AERO: 2.0000 | INHALATION_SPRAY | Freq: Two times a day (BID) | RESPIRATORY_TRACT | 5 refills | Status: DC

## 2019-04-10 NOTE — Telephone Encounter (Signed)
done

## 2019-05-29 ENCOUNTER — Other Ambulatory Visit: Payer: Self-pay | Admitting: Family Medicine

## 2019-05-29 NOTE — Telephone Encounter (Signed)
Requested medication (s) are due for refill today:yes  Requested medication (s) are on the active medication list: yes  Last refill:   Future visit scheduled: no Notes to clinic: review for refill   Requested Prescriptions  Pending Prescriptions Disp Refills   Eszopiclone 3 MG TABS 30 tablet 2    Sig: Take 1 tablet (3 mg total) by mouth at bedtime. Take immediately before bedtime     There is no refill protocol information for this order

## 2019-05-29 NOTE — Telephone Encounter (Signed)
Relation to pt: self  Call back number: [phone]  Pharmacy: [facility] [place], [place] [phone] (Phone) [phone] (Fax)     Reason for call:  Patient requesting Eszopiclone 3 MG TABS, informed patient please allow 48 to 72 hour turn around time, patient contacted pharmacy 3 days ago, please advise

## 2019-06-01 ENCOUNTER — Ambulatory Visit: Payer: BLUE CROSS/BLUE SHIELD | Admitting: Internal Medicine

## 2019-06-01 MED ORDER — ESZOPICLONE 3 MG PO TABS: 3.0000 mg | ORAL_TABLET | Freq: Every day | ORAL | 2 refills | Status: DC

## 2019-08-14 ENCOUNTER — Inpatient Hospital Stay: Payer: BLUE CROSS/BLUE SHIELD | Attending: Internal Medicine

## 2019-08-14 ENCOUNTER — Other Ambulatory Visit: Payer: Self-pay

## 2019-08-14 ENCOUNTER — Telehealth: Payer: Self-pay | Admitting: Internal Medicine

## 2019-08-14 DIAGNOSIS — Z886 Allergy status to analgesic agent status: Secondary | ICD-10-CM | POA: Diagnosis not present

## 2019-08-14 DIAGNOSIS — R911 Solitary pulmonary nodule: Secondary | ICD-10-CM | POA: Diagnosis not present

## 2019-08-14 DIAGNOSIS — Z882 Allergy status to sulfonamides status: Secondary | ICD-10-CM | POA: Insufficient documentation

## 2019-08-14 DIAGNOSIS — J984 Other disorders of lung: Secondary | ICD-10-CM | POA: Insufficient documentation

## 2019-08-14 DIAGNOSIS — J439 Emphysema, unspecified: Secondary | ICD-10-CM | POA: Insufficient documentation

## 2019-08-14 DIAGNOSIS — Z885 Allergy status to narcotic agent status: Secondary | ICD-10-CM | POA: Insufficient documentation

## 2019-08-14 DIAGNOSIS — C3490 Malignant neoplasm of unspecified part of unspecified bronchus or lung: Secondary | ICD-10-CM

## 2019-08-14 DIAGNOSIS — C3412 Malignant neoplasm of upper lobe, left bronchus or lung: Secondary | ICD-10-CM | POA: Diagnosis not present

## 2019-08-14 DIAGNOSIS — I700 Atherosclerosis of aorta: Secondary | ICD-10-CM | POA: Diagnosis not present

## 2019-08-14 DIAGNOSIS — Z880 Allergy status to penicillin: Secondary | ICD-10-CM | POA: Insufficient documentation

## 2019-08-14 DIAGNOSIS — Z79899 Other long term (current) drug therapy: Secondary | ICD-10-CM | POA: Diagnosis not present

## 2019-08-14 LAB — CBC WITH DIFFERENTIAL (CANCER CENTER ONLY)
Abs Immature Granulocytes: 0.02 10*3/uL (ref 0.00–0.07)
Basophils Absolute: 0.1 10*3/uL (ref 0.0–0.1)
Basophils Relative: 1 %
Eosinophils Absolute: 0.2 10*3/uL (ref 0.0–0.5)
Eosinophils Relative: 2 %
HCT: 46.9 % — ABNORMAL HIGH (ref 36.0–46.0)
Hemoglobin: 15.7 g/dL — ABNORMAL HIGH (ref 12.0–15.0)
Immature Granulocytes: 0 %
Lymphocytes Relative: 27 %
Lymphs Abs: 1.8 10*3/uL (ref 0.7–4.0)
MCH: 30 pg (ref 26.0–34.0)
MCHC: 33.5 g/dL (ref 30.0–36.0)
MCV: 89.5 fL (ref 80.0–100.0)
Monocytes Absolute: 0.6 10*3/uL (ref 0.1–1.0)
Monocytes Relative: 9 %
Neutro Abs: 4.2 10*3/uL (ref 1.7–7.7)
Neutrophils Relative %: 61 %
Platelet Count: 246 10*3/uL (ref 150–400)
RBC: 5.24 MIL/uL — ABNORMAL HIGH (ref 3.87–5.11)
RDW: 12.5 % (ref 11.5–15.5)
WBC Count: 6.8 10*3/uL (ref 4.0–10.5)
nRBC: 0 % (ref 0.0–0.2)

## 2019-08-14 LAB — CMP (CANCER CENTER ONLY)
ALT: 11 U/L (ref 0–44)
AST: 14 U/L — ABNORMAL LOW (ref 15–41)
Albumin: 3.9 g/dL (ref 3.5–5.0)
Alkaline Phosphatase: 102 U/L (ref 38–126)
Anion gap: 9 (ref 5–15)
BUN: 15 mg/dL (ref 8–23)
CO2: 27 mmol/L (ref 22–32)
Calcium: 9.2 mg/dL (ref 8.9–10.3)
Chloride: 105 mmol/L (ref 98–111)
Creatinine: 0.84 mg/dL (ref 0.44–1.00)
GFR, Est AFR Am: >60 mL/min (ref >60)
GFR, Estimated: >60 mL/min (ref >60)
Glucose, Bld: 107 mg/dL — ABNORMAL HIGH (ref 70–99)
Potassium: 4.3 mmol/L (ref 3.5–5.1)
Sodium: 141 mmol/L (ref 135–145)
Total Bilirubin: 0.6 mg/dL (ref 0.3–1.2)
Total Protein: 7.2 g/dL (ref 6.5–8.1)

## 2019-08-14 NOTE — Telephone Encounter (Signed)
Returned patient's phone call regarding rescheduling 11/16 appointments, per patient's request appointment has moved to 11/24.

## 2019-08-17 ENCOUNTER — Ambulatory Visit (HOSPITAL_COMMUNITY): Payer: BLUE CROSS/BLUE SHIELD

## 2019-08-17 ENCOUNTER — Telehealth: Payer: Self-pay

## 2019-08-17 ENCOUNTER — Inpatient Hospital Stay: Payer: BLUE CROSS/BLUE SHIELD | Admitting: Internal Medicine

## 2019-08-17 NOTE — Telephone Encounter (Signed)
[name] left a vm inquiring about the preauthorization of the ct scan scheduled for 08/19/2019.  Per Darlena "Auth is pending location approval. As soon as I get the PA, I will update the referral."  I relayed this information to [name].  She verbalized understanding.

## 2019-08-19 ENCOUNTER — Ambulatory Visit (HOSPITAL_COMMUNITY)
Admission: RE | Admit: 2019-08-19 | Discharge: 2019-08-19 | Disposition: A | Discharge status: Home / Self Care | Payer: BLUE CROSS/BLUE SHIELD | Source: Ambulatory Visit | Attending: Internal Medicine | Admitting: Internal Medicine

## 2019-08-19 ENCOUNTER — Encounter (HOSPITAL_COMMUNITY): Payer: Self-pay

## 2019-08-19 ENCOUNTER — Other Ambulatory Visit: Payer: Self-pay

## 2019-08-19 DIAGNOSIS — C3490 Malignant neoplasm of unspecified part of unspecified bronchus or lung: Secondary | ICD-10-CM | POA: Insufficient documentation

## 2019-08-19 HISTORY — DX: Malignant (primary) neoplasm, unspecified: C80.1

## 2019-08-19 MED ORDER — SODIUM CHLORIDE (PF) 0.9 % IJ SOLN
INTRAMUSCULAR | Status: AC
  Filled 2019-08-19: qty 50

## 2019-08-19 MED ORDER — IOHEXOL 300 MG/ML  SOLN
75.0000 mL | Freq: Once | INTRAMUSCULAR | Status: AC | PRN
  Administered 2019-08-19: 75 mL via INTRAVENOUS

## 2019-08-24 ENCOUNTER — Telehealth: Payer: Self-pay | Admitting: Internal Medicine

## 2019-08-24 NOTE — Telephone Encounter (Signed)
Called and spoke with patient. Confirmed change of appt from in person to [place] visit on 11/24

## 2019-08-25 ENCOUNTER — Inpatient Hospital Stay (HOSPITAL_BASED_OUTPATIENT_CLINIC_OR_DEPARTMENT_OTHER): Payer: BLUE CROSS/BLUE SHIELD | Admitting: Internal Medicine

## 2019-08-25 ENCOUNTER — Encounter: Payer: Self-pay | Admitting: Internal Medicine

## 2019-08-25 DIAGNOSIS — C3490 Malignant neoplasm of unspecified part of unspecified bronchus or lung: Secondary | ICD-10-CM | POA: Diagnosis not present

## 2019-08-25 DIAGNOSIS — C3412 Malignant neoplasm of upper lobe, left bronchus or lung: Secondary | ICD-10-CM | POA: Diagnosis not present

## 2019-08-25 DIAGNOSIS — C3492 Malignant neoplasm of unspecified part of left bronchus or lung: Secondary | ICD-10-CM

## 2019-08-25 HISTORY — DX: Malignant neoplasm of unspecified part of left bronchus or lung: C34.92

## 2019-08-25 NOTE — Addendum Note (Signed)
Addended by: Curt Bears on: 08/25/2019 03:57 PM   Modules accepted: Orders

## 2019-08-25 NOTE — Progress Notes (Signed)
[facility] Telephone:([phone]   Fax:(336) [phone]  PROGRESS NOTE FOR TELEMEDICINE VISITS  Patricia Macadam, MD [name] [id number]  I connected with@ on 08/25/19 at  3:30 PM EST by video enabled telemedicine visit and verified that I am speaking with the correct person using two identifiers.   I discussed the limitations, risks, security and privacy concerns of performing an evaluation and management service by telemedicine and the availability of in-person appointments. I also discussed with the patient that there may be a patient responsible charge related to this service. The patient expressed understanding and agreed to proceed.  Other persons participating in the visit and their role in the encounter:  None  Patient's location:  Home Provider's location:  [facility]  DIAGNOSIS:Stage Ib (T2, N0, M0) non-small cell lung cancer, adenocarcinoma  PRIOR THERAPY:status post wedge resection of the left upper lobe nodule in addition to lingular segmentectomy and mediastinal lymph node sampling under the care of Dr. Roxan Hockey on March 13, 2018.  CURRENT THERAPY:Observation.  INTERVAL HISTORY: [name] 63 y.o. female has a MyChart virtual video visit with me today for evaluation and discussion of HIDA scan results.  The patient is feeling fine today with no concerning complaints.  She denied having any chest pain, shortness of breath, cough or hemoptysis.  She denied having any nausea, vomiting, diarrhea or constipation.  She has no headache or visual changes.  She had repeat CT scan of the chest performed recently and she is here for evaluation and discussion of her scan results.  MEDICAL HISTORY: Past Medical History:  Diagnosis Date  . Anxiety   . 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 Bronchitis   . Depression   . Dyspnea   . Emphysema, unspecified ([place])    per patient mild case  . Family history of adverse  reaction to anesthesia    patient states sister has a hard time waking up from anesthesia.   . Hepatitis C   . lung ca dx'd 03/2018  . Tremor, unspecified    non-specific; takes inderal.     ALLERGIES:  is allergic to aspirin; morphine sulfate; oxycodone; glycopyrrolate; lactose intolerance (gi); penicillins; and sulfa antibiotics.  MEDICATIONS:  Current Outpatient Medications  Medication Sig Dispense Refill  . albuterol (PROVENTIL HFA;VENTOLIN HFA) 108 (90 Base) MCG/ACT inhaler Inhale 2 puffs into the lungs every 6 (six) hours as needed for shortness of breath. 18 g 4  . budesonide-formoterol (SYMBICORT) 160-4.5 MCG/ACT inhaler Inhale 2 puffs into the lungs 2 (two) times daily. 1 Inhaler 5  . Eszopiclone 3 MG TABS Take 1 tablet (3 mg total) by mouth at bedtime. Take immediately before bedtime 30 tablet 2  . fluticasone (FLONASE) 50 MCG/ACT nasal spray Place 1 spray into both nostrils 2 (two) times daily. 16 g 0  . gabapentin (NEURONTIN) 300 MG capsule TAKE 2 CAPSULES BY MOUTH THREE TIMES DAILY 180 capsule 0  . guaiFENesin (MUCINEX) 600 MG 12 hr tablet Take 1 tablet (600 mg total) by mouth 2 (two) times daily as needed for cough or to loosen phlegm.    [place] Kitchen ibuprofen (ADVIL,MOTRIN) 200 MG tablet Take 400-600 mg by mouth daily as needed for mild pain (for pain.).     [place] Kitchen pantoprazole (PROTONIX) 20 MG tablet Take 1 tablet by mouth daily.  0  . PARoxetine (PAXIL) 20 MG tablet Take 1 tablet (20 mg total) by mouth daily. 90 tablet 3  . propranolol ER (INDERAL LA) 60  MG 24 hr capsule Take 1 capsule (60 mg total) by mouth daily. 90 capsule 1   No current facility-administered medications for this visit.     SURGICAL HISTORY:  Past Surgical History:  Procedure Laterality Date  . ABDOMINAL HYSTERECTOMY  1986   menorrhagia; hx of cryo for abnormal paps  . LOBECTOMY Left 03/13/2018   Procedure: Lingula section of left upper lobe lobectomy;  Surgeon: Melrose Nakayama, MD;  Location: [facility];   Service: Thoracic;  Laterality: Left;  [place] VIDEO ASSISTED THORACOSCOPY (VATS)/WEDGE RESECTION Left 03/13/2018   Procedure: VIDEO ASSISTED THORACOSCOPY (VATS)/WEDGE RESECTION;  Surgeon: Melrose Nakayama, MD;  Location: [facility];  Service: Thoracic;  Laterality: Left;    REVIEW OF SYSTEMS:  A comprehensive review of systems was negative.    LABORATORY DATA: Lab Results  Component Value Date   WBC 6.8 08/14/2019   HGB 15.7 (H) 08/14/2019   HCT 46.9 (H) 08/14/2019   MCV 89.5 08/14/2019   PLT 246 08/14/2019      Chemistry      Component Value Date/Time   NA 141 08/14/2019 0946   K 4.3 08/14/2019 0946   CL 105 08/14/2019 0946   CO2 27 08/14/2019 0946   BUN 15 08/14/2019 0946   CREATININE 0.84 08/14/2019 0946   CREATININE 0.86 05/20/2018 1737      Component Value Date/Time   CALCIUM 9.2 08/14/2019 0946   ALKPHOS 102 08/14/2019 0946   AST 14 (L) 08/14/2019 0946   ALT 11 08/14/2019 0946   ALT 20 10/10/2017 1653   BILITOT 0.6 08/14/2019 0946       RADIOGRAPHIC STUDIES: Ct Chest W Contrast  Result Date: 08/19/2019 CLINICAL DATA:  Lung cancer EXAM: CT CHEST WITH CONTRAST TECHNIQUE: Multidetector CT imaging of the chest was performed during intravenous contrast administration. CONTRAST:  46mL OMNIPAQUE IOHEXOL 300 MG/ML  SOLN COMPARISON:  02/06/2019 FINDINGS: Cardiovascular: The heart size is normal. No substantial pericardial effusion. Atherosclerotic calcification is noted in the wall of the thoracic aorta. Mediastinum/Nodes: No mediastinal lymphadenopathy. There is no hilar lymphadenopathy. The esophagus has normal imaging features. There is no axillary lymphadenopathy. Lungs/Pleura: Centrilobular emphsyema noted. Biapical pleuroparenchymal scarring noted. 5 mm posterior right upper lobe nodule identified previously is stable. A lobular nodule in the right lower lobe is progressive in the interval measuring 11 x 7 mm today compared 8 x 5 mm previously. Staple lines again noted  posterior left upper lobe and lateral left lower lobe with adjacent scarring, stable. No pleural effusion. Upper Abdomen: Stable subcapsular hypervascular lesion in the medial right liver (132/2). Musculoskeletal: No worrisome lytic or sclerotic osseous abnormality. IMPRESSION: 1. Interval progression of the right lower lobe pulmonary nodule now measuring 11 x 7 mm compared 8 x 5 mm previously. Progression well seen by comparing sagittal image 45 today to sagittal image 64 previously. Lesion size is borderline for reliable assessment by PET-CT. Close follow-up recommended. 2. 5 mm right upper lobe pulmonary nodule is stable in the interval. 3. Postsurgical changes left lung, stable. 4.  Aortic Atherosclerois (ICD10-170.0) 5.  Emphysema. ([id number]) Electronically Signed   By: Misty Stanley M.D.   On: 08/19/2019 13:53    ASSESSMENT AND PLAN: 63 years old white female with a stage Ib non-small cell lung cancer, adenocarcinoma status post wedge resection of the left upper lobe nodule with lingular segmentectomy and mediastinal lymph node sampling in June 2016 under the care of Dr. Roxan Hockey.  The patient is currently on observation and she is feeling fine.  She has enlarging nodule in the right lower lobe. The patient had repeat CT scan of the chest performed recently.  I personally and independently reviewed the scan images and discussed the results with the patient today. I recommended for her to continue on observation with repeat CT scan of the chest in 3 months for further evaluation of the right lower lobe pulmonary nodule and if it continues to increase in size, we may consider the patient for a PET scan and referral to Dr. Roxan Hockey for reevaluation of surgical resection versus SBRT. The patient was advised to call immediately if she has any other concerning symptoms in the interval. I discussed the assessment and treatment plan with the patient. The patient was provided an opportunity to ask  questions and all were answered. The patient agreed with the plan and demonstrated an understanding of the instructions.   The patient was advised to call back or seek an in-person evaluation if the symptoms worsen or if the condition fails to improve as anticipated.  I provided 12 minutes of face-to-face video visit time during this encounter, and > 50% was spent counseling as documented under my assessment & plan.  Eilleen Kempf, MD 08/25/2019 3:40 PM  Disclaimer: This note was dictated with voice recognition software. Similar sounding words can inadvertently be transcribed and may not be corrected upon review.

## 2019-08-28 ENCOUNTER — Telehealth: Payer: Self-pay | Admitting: Family Medicine

## 2019-08-28 NOTE — Telephone Encounter (Signed)
Pt states that she has to quit smoking and wanted to know if Dr. Ethlyn Gallery can call in [place] for her./ please advise

## 2019-09-01 ENCOUNTER — Other Ambulatory Visit: Payer: Self-pay | Admitting: Family Medicine

## 2019-09-01 ENCOUNTER — Encounter: Payer: Self-pay | Admitting: Family Medicine

## 2019-09-01 MED ORDER — VARENICLINE TARTRATE 1 MG PO TABS: 1.0000 mg | ORAL_TABLET | Freq: Two times a day (BID) | ORAL | 1 refills | Status: DC

## 2019-09-01 MED ORDER — CHANTIX STARTING MONTH PAK 0.5 MG X 11 & 1 MG X 42 PO TABS: ORAL_TABLET | ORAL | 0 refills | Status: DC

## 2019-09-04 ENCOUNTER — Encounter: Payer: Self-pay | Admitting: Family Medicine

## 2019-09-04 ENCOUNTER — Other Ambulatory Visit: Payer: Self-pay

## 2019-09-04 ENCOUNTER — Ambulatory Visit (INDEPENDENT_AMBULATORY_CARE_PROVIDER_SITE_OTHER): Payer: BLUE CROSS/BLUE SHIELD | Admitting: Family Medicine

## 2019-09-04 VITALS — BP 128/78 | HR 68 | Temp 96.6°F | Wt 129.9 lb

## 2019-09-04 DIAGNOSIS — Z23 Encounter for immunization: Secondary | ICD-10-CM

## 2019-09-04 DIAGNOSIS — J410 Simple chronic bronchitis: Secondary | ICD-10-CM

## 2019-09-04 DIAGNOSIS — K219 Gastro-esophageal reflux disease without esophagitis: Secondary | ICD-10-CM | POA: Diagnosis not present

## 2019-09-04 DIAGNOSIS — R7309 Other abnormal glucose: Secondary | ICD-10-CM

## 2019-09-04 DIAGNOSIS — F17200 Nicotine dependence, unspecified, uncomplicated: Secondary | ICD-10-CM

## 2019-09-04 DIAGNOSIS — R0981 Nasal congestion: Secondary | ICD-10-CM

## 2019-09-04 DIAGNOSIS — G4700 Insomnia, unspecified: Secondary | ICD-10-CM

## 2019-09-04 DIAGNOSIS — E785 Hyperlipidemia, unspecified: Secondary | ICD-10-CM

## 2019-09-04 DIAGNOSIS — R251 Tremor, unspecified: Secondary | ICD-10-CM

## 2019-09-04 MED ORDER — TRAZODONE HCL 50 MG PO TABS: 25.0000 mg | ORAL_TABLET | Freq: Every evening | ORAL | 3 refills | Status: DC | PRN

## 2019-09-04 MED ORDER — FLUTICASONE PROPIONATE 50 MCG/ACT NA SUSP: 1.0000 | Freq: Two times a day (BID) | NASAL | 5 refills | Status: DC

## 2019-09-04 NOTE — Progress Notes (Signed)
Nicole Defino DOB: 03/02/1956 Encounter date: 09/04/2019  This is a 63 y.o. female who presents with Chief Complaint  Patient presents with  . Follow-up    review meds, lipids, insomnia    History of present illness: Just retired in June - they let her go as COVID started.   Know that cholesterol needed to be rechecked but with COVID hadn't been back in.   Just wanted to make sure meds were ok.   Inderall that she takes for the shakes seems to be good, but still has some shaking taking it all the time.   Has some post nasal drip and some nasal drainage. Not sure if she should take claritin or claritin D.   Both inhalers seem to be working well.   Still not sleeping well. Gets in bed, takes lunesta. Couple hours later still awake and takes 20-30mg  melatonin. Mind just running and can't sleep if she doesn't take something.   Still a lot of stress with caring for husband.  They had not been living together but she took him in after he had a stroke.  She has a difficult time managing his health and demands.  She does not really have help with his care and has difficulty with getting him to do even some of the basics like bathing.  He also smokes in the house, so she feels this may hinder her ability to quit but feels he may move smoking to outside if she asks them.  Breathing comes and goes. Still using symbicort, mucinex prn. Uses the ventolin twice daily.   No longer taking protonix. Taking otc omeprazole. 20mg  daily. Sx controlled.   Hasn't started chantix yet but she plans to and she really wants to quit smoking.    Allergies  Allergen Reactions  . Aspirin Shortness Of Breath  . Morphine Sulfate Other (See Comments)    RED STREAKS ON ARMS.  Marland Kitchen Oxycodone Itching and Rash  . Glycopyrrolate     UNSPECIFIED REACTION  pt unsure if she is allergic.  . Lactose Intolerance (Gi) Nausea And Vomiting    Milk only.Patient states that she can eat cheese.  Marland Kitchen Penicillins Other (See  Comments)     Heartburn, upset stomach only & tolerated AMOXIL after this reaction Has patient had a PCN reaction causing immediate rash, facial/tongue/throat swelling, SOB or lightheadedness with hypotension: No Has patient had a PCN reaction causing severe rash involving mucus membranes or skin necrosis: No Has patient had a PCN reaction that required hospitalization: No Has patient had a PCN reaction occurring within the last 10 years: #  #  #  YES  #  #  #   . Sulfa Antibiotics Tinitus   Current Meds  Medication Sig  . albuterol (PROVENTIL HFA;VENTOLIN HFA) 108 (90 Base) MCG/ACT inhaler Inhale 2 puffs into the lungs every 6 (six) hours as needed for shortness of breath.  . budesonide-formoterol (SYMBICORT) 160-4.5 MCG/ACT inhaler Inhale 2 puffs into the lungs 2 (two) times daily.  Marland Kitchen ibuprofen (ADVIL,MOTRIN) 200 MG tablet Take 400-600 mg by mouth daily as needed for mild pain (for pain.).   Marland Kitchen PARoxetine (PAXIL) 20 MG tablet Take 1 tablet (20 mg total) by mouth daily.  . propranolol ER (INDERAL LA) 60 MG 24 hr capsule Take 1 capsule (60 mg total) by mouth daily.  . varenicline (CHANTIX CONTINUING MONTH PAK) 1 MG tablet Take 1 tablet (1 mg total) by mouth 2 (two) times daily.  . varenicline (CHANTIX STARTING MONTH PAK)  0.5 MG X 11 & 1 MG X 42 tablet Take one 0.5 mg tablet by mouth once daily for 3 days, then increase to one 0.5 mg tablet twice daily for 4 days, then increase to one 1 mg tablet twice daily.  . [DISCONTINUED] Eszopiclone 3 MG TABS Take 1 tablet (3 mg total) by mouth at bedtime. Take immediately before bedtime  . [DISCONTINUED] gabapentin (NEURONTIN) 300 MG capsule TAKE 2 CAPSULES BY MOUTH THREE TIMES DAILY    Review of Systems  Constitutional: Negative for chills, fatigue and fever.  Respiratory: Positive for shortness of breath (at baseline). Negative for cough, chest tightness and wheezing.   Cardiovascular: Negative for chest pain, palpitations and leg swelling.   Gastrointestinal: Negative for abdominal pain and nausea.  Neurological: Positive for tremors (improved from previous).    Objective:  BP 128/78 (BP Location: Left Arm)   Pulse 68   Temp (!) 96.6 F (35.9 C) (Temporal)   Wt 129 lb 14.4 oz (58.9 kg)   SpO2 99%   BMI 21.62 kg/m   Weight: 129 lb 14.4 oz (58.9 kg)   BP Readings from Last 3 Encounters:  09/04/19 128/78  11/11/18 (!) 135/99  08/11/18 108/70   Wt Readings from Last 3 Encounters:  09/04/19 129 lb 14.4 oz (58.9 kg)  11/11/18 154 lb 9.6 oz (70.1 kg)  08/11/18 161 lb 1.6 oz (73.1 kg)    Physical Exam Constitutional:      General: She is not in acute distress.    Appearance: She is well-developed.     Comments: She is thin  Cardiovascular:     Rate and Rhythm: Normal rate and regular rhythm.     Heart sounds: Normal heart sounds. No murmur. No friction rub.  Pulmonary:     Effort: Pulmonary effort is normal. No respiratory distress.     Breath sounds: Normal breath sounds. No wheezing or rales.  Musculoskeletal:     Right lower leg: No edema.     Left lower leg: No edema.  Neurological:     Mental Status: She is alert and oriented to person, place, and time.     Comments: Mild resting and action tremor bilateral hands.  Psychiatric:        Behavior: Behavior normal.     Assessment/Plan  1. Nasal sinus congestion Encouraged her to restart her Flonase, which she had quit.  I think this will help more with the nasal drainage as well as irritation in her throat from postnasal drainage.  Let me know if this is not working. - fluticasone (FLONASE) 50 MCG/ACT nasal spray; Place 1 spray into both nostrils 2 (two) times daily.  Dispense: 16 g; Refill: 5 - CBC with Differential/Platelet; Future - CBC with Differential/Platelet  2. Simple chronic bronchitis ([place]) Able with current inhalers.  Continue these.  She is interested in quitting smoking and planning to start Chantix.  3. Tobacco use disorder See above.   Encouraged her to try to get her husband to smoke outside before trying to quit as this will make quitting for her easier.  4. Gastroesophageal reflux disease, unspecified whether esophagitis present Well-controlled with over-the-counter omeprazole.  5. Tremor When compared to previous symptoms, tremors actually better on current dose of Inderal.  Continue this dose.  6. Hyperlipidemia, unspecified hyperlipidemia type Continue current statin. - TSH; Future - Lipid panel; Future - Lipid panel - TSH  7. Elevated glucose - Hemoglobin A1c; Future - Comprehensive metabolic panel; Future - Comprehensive metabolic panel -  Hemoglobin A1c  8. Insomnia, unspecified type Trial of trazodone instead of Lunesta.  She will let me know if this does not work.  Encouraged her to hold off on the melatonin as well. - traZODone (DESYREL) 50 MG tablet; Take 0.5-1 tablets (25-50 mg total) by mouth at bedtime as needed for sleep.  Dispense: 30 tablet; Refill: 3  9. Need for vaccination with 13-polyvalent pneumococcal conjugate vaccine - Prevnar  Return in about 3 months (around 12/03/2019) for physical exam.       Micheline Rough, MD

## 2019-09-05 LAB — COMPREHENSIVE METABOLIC PANEL
AG Ratio: 1.7 (calc) (ref 1.0–2.5)
ALT: 8 U/L (ref 6–29)
AST: 15 U/L (ref 10–35)
Albumin: 5.1 g/dL (ref 3.6–5.1)
Alkaline phosphatase (APISO): 100 U/L (ref 37–153)
BUN: 12 mg/dL (ref 7–25)
CO2: 24 mmol/L (ref 20–32)
Calcium: 10.5 mg/dL — ABNORMAL HIGH (ref 8.6–10.4)
Chloride: 102 mmol/L (ref 98–110)
Creat: 0.8 mg/dL (ref 0.50–0.99)
Globulin: 3 g/dL (calc) (ref 1.9–3.7)
Glucose, Bld: 109 mg/dL — ABNORMAL HIGH (ref 65–99)
Potassium: 5 mmol/L (ref 3.5–5.3)
Sodium: 137 mmol/L (ref 135–146)
Total Bilirubin: 1 mg/dL (ref 0.2–1.2)
Total Protein: 8.1 g/dL (ref 6.1–8.1)

## 2019-09-05 LAB — CBC WITH DIFFERENTIAL/PLATELET
Absolute Monocytes: 562 cells/uL (ref 200–950)
Basophils Absolute: 39 cells/uL (ref 0–200)
Basophils Relative: 0.5 %
Eosinophils Absolute: 101 cells/uL (ref 15–500)
Eosinophils Relative: 1.3 %
HCT: 54.6 % — ABNORMAL HIGH (ref 35.0–45.0)
Hemoglobin: 18.5 g/dL — ABNORMAL HIGH (ref 11.7–15.5)
Lymphs Abs: 1490 cells/uL (ref 850–3900)
MCH: 30.3 pg (ref 27.0–33.0)
MCHC: 33.9 g/dL (ref 32.0–36.0)
MCV: 89.5 fL (ref 80.0–100.0)
MPV: 11.6 fL (ref 7.5–12.5)
Monocytes Relative: 7.2 %
Neutro Abs: 5608 cells/uL (ref 1500–7800)
Neutrophils Relative %: 71.9 %
Platelets: 264 10*3/uL (ref 140–400)
RBC: 6.1 10*6/uL — ABNORMAL HIGH (ref 3.80–5.10)
RDW: 12.2 % (ref 11.0–15.0)
Total Lymphocyte: 19.1 %
WBC: 7.8 10*3/uL (ref 3.8–10.8)

## 2019-09-05 LAB — LIPID PANEL
Cholesterol: 242 mg/dL — ABNORMAL HIGH (ref <200)
HDL: 50 mg/dL (ref >=50)
LDL Cholesterol (Calc): 149 mg/dL (calc) — ABNORMAL HIGH
Non-HDL Cholesterol (Calc): 192 mg/dL (calc) — ABNORMAL HIGH (ref <130)
Total CHOL/HDL Ratio: 4.8 (calc) (ref <5.0)
Triglycerides: 260 mg/dL — ABNORMAL HIGH (ref <150)

## 2019-09-05 LAB — TSH: TSH: 2.01 mIU/L (ref 0.40–4.50)

## 2019-09-05 LAB — HEMOGLOBIN A1C
Hgb A1c MFr Bld: 5.3 % of total Hgb (ref <5.7)
Mean Plasma Glucose: 105 (calc)
eAG (mmol/L): 5.8 (calc)

## 2019-09-15 ENCOUNTER — Other Ambulatory Visit: Payer: Self-pay | Admitting: Family Medicine

## 2019-09-18 ENCOUNTER — Telehealth: Payer: Self-pay | Admitting: *Deleted

## 2019-09-18 ENCOUNTER — Other Ambulatory Visit: Payer: Self-pay | Admitting: Family Medicine

## 2019-09-18 MED ORDER — PAROXETINE HCL 20 MG PO TABS: 20.0000 mg | ORAL_TABLET | Freq: Every day | ORAL | 1 refills | Status: DC

## 2019-09-18 NOTE — Telephone Encounter (Signed)
[facility] faxed a refill request for Paroxetine 20mg -take 1 tablet by mouth once daily-#90.  Message sent to Dr Ethlyn Gallery as the last Rx was given by Dr Burnice Logan.

## 2019-09-18 NOTE — Telephone Encounter (Signed)
Noted  

## 2019-09-18 NOTE — Telephone Encounter (Signed)
sent 

## 2019-11-23 ENCOUNTER — Ambulatory Visit (HOSPITAL_COMMUNITY)
Admission: RE | Admit: 2019-11-23 | Discharge: 2019-11-23 | Disposition: A | Discharge status: Home / Self Care | Payer: BC Managed Care – PPO | Source: Ambulatory Visit | Attending: Internal Medicine | Admitting: Internal Medicine

## 2019-11-23 ENCOUNTER — Other Ambulatory Visit: Payer: Self-pay

## 2019-11-23 ENCOUNTER — Inpatient Hospital Stay: Payer: BC Managed Care – PPO | Attending: Internal Medicine

## 2019-11-23 DIAGNOSIS — Z885 Allergy status to narcotic agent status: Secondary | ICD-10-CM | POA: Insufficient documentation

## 2019-11-23 DIAGNOSIS — Z882 Allergy status to sulfonamides status: Secondary | ICD-10-CM | POA: Diagnosis not present

## 2019-11-23 DIAGNOSIS — C3412 Malignant neoplasm of upper lobe, left bronchus or lung: Secondary | ICD-10-CM | POA: Diagnosis not present

## 2019-11-23 DIAGNOSIS — I700 Atherosclerosis of aorta: Secondary | ICD-10-CM | POA: Insufficient documentation

## 2019-11-23 DIAGNOSIS — Z79899 Other long term (current) drug therapy: Secondary | ICD-10-CM | POA: Diagnosis not present

## 2019-11-23 DIAGNOSIS — C3490 Malignant neoplasm of unspecified part of unspecified bronchus or lung: Secondary | ICD-10-CM | POA: Diagnosis not present

## 2019-11-23 DIAGNOSIS — Z886 Allergy status to analgesic agent status: Secondary | ICD-10-CM | POA: Insufficient documentation

## 2019-11-23 DIAGNOSIS — J439 Emphysema, unspecified: Secondary | ICD-10-CM | POA: Diagnosis not present

## 2019-11-23 DIAGNOSIS — Z7951 Long term (current) use of inhaled steroids: Secondary | ICD-10-CM | POA: Insufficient documentation

## 2019-11-23 DIAGNOSIS — C3492 Malignant neoplasm of unspecified part of left bronchus or lung: Secondary | ICD-10-CM

## 2019-11-23 DIAGNOSIS — Z880 Allergy status to penicillin: Secondary | ICD-10-CM | POA: Diagnosis not present

## 2019-11-23 LAB — CBC WITH DIFFERENTIAL (CANCER CENTER ONLY)
Abs Immature Granulocytes: 0.01 10*3/uL (ref 0.00–0.07)
Basophils Absolute: 0.1 10*3/uL (ref 0.0–0.1)
Basophils Relative: 1 %
Eosinophils Absolute: 0.2 10*3/uL (ref 0.0–0.5)
Eosinophils Relative: 3 %
HCT: 49 % — ABNORMAL HIGH (ref 36.0–46.0)
Hemoglobin: 16.7 g/dL — ABNORMAL HIGH (ref 12.0–15.0)
Immature Granulocytes: 0 %
Lymphocytes Relative: 26 %
Lymphs Abs: 1.5 10*3/uL (ref 0.7–4.0)
MCH: 30.4 pg (ref 26.0–34.0)
MCHC: 34.1 g/dL (ref 30.0–36.0)
MCV: 89.3 fL (ref 80.0–100.0)
Monocytes Absolute: 0.7 10*3/uL (ref 0.1–1.0)
Monocytes Relative: 11 %
Neutro Abs: 3.5 10*3/uL (ref 1.7–7.7)
Neutrophils Relative %: 59 %
Platelet Count: 227 10*3/uL (ref 150–400)
RBC: 5.49 MIL/uL — ABNORMAL HIGH (ref 3.87–5.11)
RDW: 12.4 % (ref 11.5–15.5)
WBC Count: 5.9 10*3/uL (ref 4.0–10.5)
nRBC: 0 % (ref 0.0–0.2)

## 2019-11-23 LAB — CMP (CANCER CENTER ONLY)
ALT: 9 U/L (ref 0–44)
AST: 14 U/L — ABNORMAL LOW (ref 15–41)
Albumin: 4.3 g/dL (ref 3.5–5.0)
Alkaline Phosphatase: 75 U/L (ref 38–126)
Anion gap: 13 (ref 5–15)
BUN: 12 mg/dL (ref 8–23)
CO2: 24 mmol/L (ref 22–32)
Calcium: 9.5 mg/dL (ref 8.9–10.3)
Chloride: 105 mmol/L (ref 98–111)
Creatinine: 0.88 mg/dL (ref 0.44–1.00)
GFR, Est AFR Am: >60 mL/min (ref >60)
GFR, Estimated: >60 mL/min (ref >60)
Glucose, Bld: 103 mg/dL — ABNORMAL HIGH (ref 70–99)
Potassium: 3.9 mmol/L (ref 3.5–5.1)
Sodium: 142 mmol/L (ref 135–145)
Total Bilirubin: 1 mg/dL (ref 0.3–1.2)
Total Protein: 8 g/dL (ref 6.5–8.1)

## 2019-11-23 MED ORDER — SODIUM CHLORIDE (PF) 0.9 % IJ SOLN
INTRAMUSCULAR | Status: AC
  Filled 2019-11-23: qty 50

## 2019-11-23 MED ORDER — IOHEXOL 300 MG/ML  SOLN
75.0000 mL | Freq: Once | INTRAMUSCULAR | Status: AC | PRN
  Administered 2019-11-23: 75 mL via INTRAVENOUS

## 2019-11-25 ENCOUNTER — Encounter: Payer: Self-pay | Admitting: Internal Medicine

## 2019-11-25 ENCOUNTER — Other Ambulatory Visit: Payer: Self-pay

## 2019-11-25 ENCOUNTER — Inpatient Hospital Stay (HOSPITAL_BASED_OUTPATIENT_CLINIC_OR_DEPARTMENT_OTHER): Payer: BC Managed Care – PPO | Admitting: Internal Medicine

## 2019-11-25 VITALS — BP 133/73 | HR 67 | Temp 98.0°F | Resp 18 | Ht 65.0 in | Wt 120.7 lb

## 2019-11-25 DIAGNOSIS — F17200 Nicotine dependence, unspecified, uncomplicated: Secondary | ICD-10-CM | POA: Diagnosis not present

## 2019-11-25 DIAGNOSIS — C3490 Malignant neoplasm of unspecified part of unspecified bronchus or lung: Secondary | ICD-10-CM

## 2019-11-25 DIAGNOSIS — C3492 Malignant neoplasm of unspecified part of left bronchus or lung: Secondary | ICD-10-CM | POA: Diagnosis not present

## 2019-11-25 DIAGNOSIS — C3412 Malignant neoplasm of upper lobe, left bronchus or lung: Secondary | ICD-10-CM | POA: Diagnosis not present

## 2019-11-25 NOTE — Progress Notes (Signed)
[place] [phone]   [phone]  OFFICE PROGRESS NOTE  [name], MD [place] [address]  DIAGNOSIS: Stage Ib (T2, N0, M0) non-small cell lung cancer, adenocarcinoma   PRIOR THERAPY: status post wedge resection of the left upper lobe nodule in addition to lingular segmentectomy and mediastinal lymph node sampling under the care of Dr. Roxan Hockey on March 13, 2018.  CURRENT THERAPY: Observation.  INTERVAL HISTORY: [name] 64 y.o. female returns to the clinic today for follow-up visit.  The patient is feeling fine today with no concerning complaints.  Unfortunately she continues to smoke at regular basis and she is trying to quit.  She tried Chantix before but made her sick.  She is enrolled in a smoking cessation class at [facility] health.  This is expected to start early March 2021.  She denied having any current chest pain, shortness of breath, cough or hemoptysis.  She denied having any fever or chills.  She has no nausea, vomiting, diarrhea or constipation.  She had repeat CT scan of the chest performed recently and she is here today for evaluation and discussion of her scan results.   MEDICAL HISTORY: Past Medical History:  Diagnosis Date  . Anxiety   . 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 Bronchitis   . Depression   . Dyspnea   . Emphysema, unspecified ([place])    per patient mild case  . Family history of adverse reaction to anesthesia    patient states sister has a hard time waking up from anesthesia.   . Hepatitis C   . lung ca dx'd 03/2018  . Tremor, unspecified    non-specific; takes inderal.     ALLERGIES:  is allergic to aspirin; morphine sulfate; oxycodone; glycopyrrolate; lactose intolerance (gi); penicillins; and sulfa antibiotics.  MEDICATIONS:  Current Outpatient Medications  Medication Sig Dispense Refill  . albuterol (PROVENTIL HFA;VENTOLIN HFA) 108 (90 Base) MCG/ACT inhaler Inhale 2  puffs into the lungs every 6 (six) hours as needed for shortness of breath. 18 g 4  . budesonide-formoterol (SYMBICORT) 160-4.5 MCG/ACT inhaler Inhale 2 puffs into the lungs 2 (two) times daily. 1 Inhaler 5  . fluticasone (FLONASE) 50 MCG/ACT nasal spray Place 1 spray into both nostrils 2 (two) times daily. 16 g 5  . guaiFENesin (MUCINEX) 600 MG 12 hr tablet Take 1 tablet (600 mg total) by mouth 2 (two) times daily as needed for cough or to loosen phlegm.    Marland Kitchen ibuprofen (ADVIL,MOTRIN) 200 MG tablet Take 400-600 mg by mouth daily as needed for mild pain (for pain.).     Marland Kitchen PARoxetine (PAXIL) 20 MG tablet Take 1 tablet (20 mg total) by mouth daily. 90 tablet 1  . propranolol ER (INDERAL LA) 60 MG 24 hr capsule Take 1 capsule by mouth once daily 30 capsule 3  . traZODone (DESYREL) 50 MG tablet Take 0.5-1 tablets (25-50 mg total) by mouth at bedtime as needed for sleep. 30 tablet 3   No current facility-administered medications for this visit.    SURGICAL HISTORY:  Past Surgical History:  Procedure Laterality Date  . ABDOMINAL HYSTERECTOMY  1986   menorrhagia; hx of cryo for abnormal paps  . LOBECTOMY Left 03/13/2018   Procedure: Lingula section of left upper lobe lobectomy;  Surgeon: Melrose Nakayama, MD;  Location: [place];  Service: Thoracic;  Laterality: Left;  Marland Kitchen VIDEO ASSISTED THORACOSCOPY (VATS)/WEDGE RESECTION Left 03/13/2018   Procedure: VIDEO ASSISTED  THORACOSCOPY (VATS)/WEDGE RESECTION;  Surgeon: Melrose Nakayama, MD;  [facility];  Service: Thoracic;  Laterality: Left;    REVIEW OF SYSTEMS:  A comprehensive review of systems was negative.   PHYSICAL EXAMINATION: General appearance: alert, cooperative and no distress Head: Normocephalic, without obvious abnormality, atraumatic Neck: no adenopathy, no JVD, supple, symmetrical, trachea midline and thyroid not enlarged, symmetric, no tenderness/mass/nodules Lymph nodes: Cervical, supraclavicular, and axillary nodes  normal. Resp: clear to auscultation bilaterally Back: symmetric, no curvature. ROM normal. No CVA tenderness. Cardio: regular rate and rhythm, S1, S2 normal, no murmur, click, rub or gallop GI: soft, non-tender; bowel sounds normal; no masses,  no organomegaly Extremities: extremities normal, atraumatic, no cyanosis or edema  ECOG PERFORMANCE STATUS: 1 - Symptomatic but completely ambulatory  Blood pressure 133/73, pulse 67, temperature 98 F (36.7 C), temperature source Temporal, resp. rate 18, height 5\' 5"  (1.651 m), weight 120 lb 11.2 oz (54.7 kg), SpO2 93 %.  LABORATORY DATA: Lab Results  Component Value Date   WBC 5.9 11/23/2019   HGB 16.7 (H) 11/23/2019   HCT 49.0 (H) 11/23/2019   MCV 89.3 11/23/2019   PLT 227 11/23/2019      Chemistry      Component Value Date/Time   NA 142 11/23/2019 1120   K 3.9 11/23/2019 1120   CL 105 11/23/2019 1120   CO2 24 11/23/2019 1120   BUN 12 11/23/2019 1120   CREATININE 0.88 11/23/2019 1120   CREATININE 0.80 09/04/2019 1545      Component Value Date/Time   CALCIUM 9.5 11/23/2019 1120   ALKPHOS 75 11/23/2019 1120   AST 14 (L) 11/23/2019 1120   ALT 9 11/23/2019 1120   ALT 20 10/10/2017 1653   BILITOT 1.0 11/23/2019 1120       RADIOGRAPHIC STUDIES: CT Chest W Contrast  Result Date: 11/23/2019 CLINICAL DATA:  Stage IB left upper lobe lung adenocarcinoma status post wedge resection 03/13/2018, presenting for restaging. Interval observation. EXAM: CT CHEST WITH CONTRAST TECHNIQUE: Multidetector CT imaging of the chest was performed during intravenous contrast administration. CONTRAST:  67mL OMNIPAQUE IOHEXOL 300 MG/ML  SOLN COMPARISON:  08/19/2019 chest CT. FINDINGS: Cardiovascular: Normal heart size. No significant pericardial effusion/thickening. Atherosclerotic nonaneurysmal thoracic aorta. Normal caliber pulmonary arteries. No central pulmonary emboli. Mediastinum/Nodes: No discrete thyroid nodules. Unremarkable esophagus. No  pathologically enlarged axillary, mediastinal or hilar lymph nodes. Lungs/Pleura: No pneumothorax. No pleural effusion. Moderate centrilobular emphysema. Stable 0.5 cm ground-glass right upper lobe pulmonary nodule (series 7/image 52). Irregular solid 1.2 x 0.9 cm superior segment right lower lobe pulmonary nodule (series 7/image 61), previously 1.1 x 0.7 cm on 08/19/2019 chest CT, mildly increased in the interval, and clearly increased from prior chest CT studies, for example measuring 0.4 x 0.3 cm on 07/18/2017 CT and 0.8 x 0.6 cm on 11/07/2018. Stable postsurgical changes from wedge resection in posterior left upper lobe/anterior left lower lobe. No acute consolidative airspace disease or new significant pulmonary nodules. Upper abdomen: No acute abnormality. Musculoskeletal: No aggressive appearing focal osseous lesions. Moderate thoracic spondylosis. IMPRESSION: 1. Continued slow growth of irregular solid 1.2 cm superior segment right lower lobe pulmonary nodule, worrisome for metachronous primary bronchogenic adenocarcinoma. 2. No evidence of local tumor recurrence in the left lung status post left upper lobe wedge resection. 3. No evidence of metastatic disease in the chest. 4. Aortic Atherosclerosis (ICD10-I70.0) and Emphysema (ICD10-J43.9). Electronically Signed   By: Ilona Sorrel M.D.   On: 11/23/2019 14:58    ASSESSMENT AND PLAN: This  is a very pleasant 64 years old white female diagnosed with a stage Ib non-small cell lung cancer, adenocarcinoma status post wedge resection of the left upper lobe lung nodule in addition to lingular segmentectomy and mediastinal lymph node dissection in June 2019. The patient is currently on observation.  She is feeling fine. She had repeat CT scan of the chest performed recently.  I personally and independently reviewed the scan images and discussed the result and showed the images to the patient today. HIDA scan showed enlargement of regular solid superior segment  right lower lobe pulmonary nodule suspicious for metachronous primary bronchogenic carcinoma. I recommended for the patient to have a PET scan for further evaluation of this lesion. I will arrange for the patient to come back for follow-up visit in around 2 weeks to the multidisciplinary thoracic oncology clinic to meet with thoracic surgery as well as radiation oncologist in addition to myself. For smoking cessation, the patient is enrolled in a smoke cessation program at [facility] health starting early next month. She was advised to call immediately if she has any concerning symptoms in the interval. The patient voices understanding of current disease status and treatment options and is in agreement with the current care plan.  All questions were answered. The patient knows to call the clinic with any problems, questions or concerns. We can certainly see the patient much sooner if necessary.  Disclaimer: This note was dictated with voice recognition software. Similar sounding words can inadvertently be transcribed and may not be corrected upon review.

## 2019-11-26 ENCOUNTER — Telehealth: Payer: Self-pay | Admitting: *Deleted

## 2019-11-26 NOTE — Telephone Encounter (Signed)
Oncology Nurse Navigator Documentation  Oncology Nurse Navigator Flowsheets 11/26/2019  Abnormal Finding Date -  Confirmed Diagnosis Date -  Expected Surgery Date -  Navigator Location [facility]  Referral Date to RadOnc/MedOnc -  Navigator Encounter Type Telephone/I followed up on [name]'s schedule. She has her PET scan on 3/4.  I called patient to set her up to see Dr. Roxan Hockey on 3/1 but was unable to reach her. I did leave vm message with my name and phone number to call.   Telephone Outgoing Call  Barriers/Navigation Needs Coordination of Care;Education  Education Other  Interventions Coordination of Care;Education  Acuity Level 2-Minimal Needs (1-2 Barriers Identified)  Coordination of Care Other  Education Method Verbal  Time Spent with Patient 15

## 2019-11-27 ENCOUNTER — Encounter: Payer: Self-pay | Admitting: *Deleted

## 2019-11-27 NOTE — Progress Notes (Signed)
Oncology Nurse Navigator Documentation  Oncology Nurse Navigator Flowsheets 11/27/2019  Abnormal Finding Date -  Confirmed Diagnosis Date -  Expected Surgery Date -  Navigator Location [facility]  Referral Date to RadOnc/MedOnc -  Navigator Encounter Type Telephone/I received a vm message from [name].  I called her back and updated her on appt to be seen at [facility] on 3/11 to see Dr. Roxan Hockey.   Telephone Incoming Call;Outgoing Call  Treatment Phase Abnormal Scans  Barriers/Navigation Needs Coordination of Care;Education  Education Other  Interventions Coordination of Care;Education  Acuity Level 2-Minimal Needs (1-2 Barriers Identified)  Coordination of Care Appts  Education Method Verbal  Time Spent with Patient 30

## 2019-12-03 ENCOUNTER — Other Ambulatory Visit: Payer: Self-pay

## 2019-12-03 ENCOUNTER — Ambulatory Visit (HOSPITAL_COMMUNITY)
Admission: RE | Admit: 2019-12-03 | Discharge: 2019-12-03 | Disposition: A | Discharge status: Home / Self Care | Payer: BC Managed Care – PPO | Source: Ambulatory Visit | Attending: Internal Medicine | Admitting: Internal Medicine

## 2019-12-03 DIAGNOSIS — C3490 Malignant neoplasm of unspecified part of unspecified bronchus or lung: Secondary | ICD-10-CM | POA: Insufficient documentation

## 2019-12-03 LAB — GLUCOSE, CAPILLARY: Glucose-Capillary: 105 mg/dL — ABNORMAL HIGH (ref 70–99)

## 2019-12-03 MED ORDER — FLUDEOXYGLUCOSE F - 18 (FDG) INJECTION
7.0000 | Freq: Once | INTRAVENOUS | Status: AC
  Administered 2019-12-03: 7 via INTRAVENOUS

## 2019-12-09 ENCOUNTER — Other Ambulatory Visit: Payer: Self-pay | Admitting: *Deleted

## 2019-12-09 DIAGNOSIS — C3490 Malignant neoplasm of unspecified part of unspecified bronchus or lung: Secondary | ICD-10-CM

## 2019-12-10 ENCOUNTER — Other Ambulatory Visit: Payer: Self-pay | Admitting: *Deleted

## 2019-12-10 ENCOUNTER — Inpatient Hospital Stay: Payer: BC Managed Care – PPO | Attending: Internal Medicine | Admitting: Internal Medicine

## 2019-12-10 ENCOUNTER — Other Ambulatory Visit: Payer: Self-pay

## 2019-12-10 ENCOUNTER — Ambulatory Visit: Payer: BC Managed Care – PPO | Admitting: Radiation Oncology

## 2019-12-10 ENCOUNTER — Encounter: Payer: Self-pay | Admitting: Internal Medicine

## 2019-12-10 ENCOUNTER — Institutional Professional Consult (permissible substitution) (INDEPENDENT_AMBULATORY_CARE_PROVIDER_SITE_OTHER): Payer: BC Managed Care – PPO | Admitting: Thoracic Surgery (Cardiothoracic Vascular Surgery)

## 2019-12-10 ENCOUNTER — Inpatient Hospital Stay: Payer: BC Managed Care – PPO

## 2019-12-10 ENCOUNTER — Telehealth: Payer: Self-pay | Admitting: *Deleted

## 2019-12-10 DIAGNOSIS — J439 Emphysema, unspecified: Secondary | ICD-10-CM | POA: Diagnosis not present

## 2019-12-10 DIAGNOSIS — Z79899 Other long term (current) drug therapy: Secondary | ICD-10-CM | POA: Diagnosis not present

## 2019-12-10 DIAGNOSIS — J320 Chronic maxillary sinusitis: Secondary | ICD-10-CM | POA: Insufficient documentation

## 2019-12-10 DIAGNOSIS — C3490 Malignant neoplasm of unspecified part of unspecified bronchus or lung: Secondary | ICD-10-CM | POA: Diagnosis not present

## 2019-12-10 DIAGNOSIS — I700 Atherosclerosis of aorta: Secondary | ICD-10-CM | POA: Insufficient documentation

## 2019-12-10 DIAGNOSIS — Z880 Allergy status to penicillin: Secondary | ICD-10-CM | POA: Diagnosis not present

## 2019-12-10 DIAGNOSIS — R911 Solitary pulmonary nodule: Secondary | ICD-10-CM

## 2019-12-10 DIAGNOSIS — Z886 Allergy status to analgesic agent status: Secondary | ICD-10-CM | POA: Diagnosis not present

## 2019-12-10 DIAGNOSIS — C3412 Malignant neoplasm of upper lobe, left bronchus or lung: Secondary | ICD-10-CM | POA: Diagnosis present

## 2019-12-10 DIAGNOSIS — Z885 Allergy status to narcotic agent status: Secondary | ICD-10-CM | POA: Insufficient documentation

## 2019-12-10 DIAGNOSIS — Z882 Allergy status to sulfonamides status: Secondary | ICD-10-CM | POA: Insufficient documentation

## 2019-12-10 DIAGNOSIS — I2510 Atherosclerotic heart disease of native coronary artery without angina pectoris: Secondary | ICD-10-CM | POA: Insufficient documentation

## 2019-12-10 MED ORDER — GABAPENTIN 300 MG PO CAPS: ORAL_CAPSULE | ORAL | 0 refills | Status: DC

## 2019-12-10 NOTE — Progress Notes (Signed)
PCP is [name], MD Referring Provider is Curt Bears, MD    HPI: [name] is sent for consultation re: a new right lower lobe lung nodule.  [name] is a 64 yo woman with a history of tobacco abuse, COPD, stage IB(T2N0) adenocarcinoma, chronic bronchitis, and hepatitis C. She had a lingular segmentectomy for a stage IB adenocarcinoma in June of 2019. She did not require adjuvant chemotherapy and has been followed since.  She had neuropathic pain postop which responded to gabapentin.  She has been feeling well. She had quit smoking but recently has started smoking again. She is currently at about 1/2 ppd. She denies chest pain, pressure and tightness. Occasional wheezing and chronic nonproductive cough. She does get SOB with exertion and has to take a cart to get around [place] but she can walk up a flight of stairs without stopping.  She recently had a CT for follow up which showed an increase in the size of a RLL nodule. There was a smaller GGO in the RUL that is unchanged. There was no adenopathy. A PET CT showed minimal uptake in the RLL nodule.  Zubrod Score: At the time of surgery this patient's most appropriate activity status/level should be described as: []     0    Normal activity, no symptoms []     1    Restricted in physical strenuous activity but ambulatory, able to do out light work [x]     2    Ambulatory and capable of self care, unable to do work activities, up and about >50 % of waking hours                              []     3    Only limited self care, in bed greater than 50% of waking hours []     4    Completely disabled, no self care, confined to bed or chair []     5    Moribund  Past Medical History:  Diagnosis Date  . Anxiety   . 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 Bronchitis   . Depression   . Dyspnea   . Emphysema, unspecified ([place])    per patient mild case  . Family history of adverse reaction to anesthesia    patient states sister has a  hard time waking up from anesthesia.   . Hepatitis C   . lung ca dx'd 03/2018  . Tremor, unspecified    non-specific; takes inderal.     Past Surgical History:  Procedure Laterality Date  . ABDOMINAL HYSTERECTOMY  1986   menorrhagia; hx of cryo for abnormal paps  . LOBECTOMY Left 03/13/2018   Procedure: Lingula section of left upper lobe lobectomy;  Surgeon: Melrose Nakayama, MD;  Location: [facility];  Service: Thoracic;  Laterality: Left;  Marland Kitchen VIDEO ASSISTED THORACOSCOPY (VATS)/WEDGE RESECTION Left 03/13/2018   Procedure: VIDEO ASSISTED THORACOSCOPY (VATS)/WEDGE RESECTION;  Surgeon: Melrose Nakayama, MD;  Location: [facility] OR;  Service: Thoracic;  Laterality: Left;    Family History  Problem Relation Age of Onset  . Asthma Mother   . Alzheimer's disease Mother 70  . Hypertension Mother   . Other Father        shot   . Neuropathy Sister        face  . Cancer Maternal Aunt        breast  . Cancer Paternal Aunt   .  Cancer Paternal Grandmother        abdominal  . Lung cancer Paternal Uncle     Social History Social History   Tobacco Use  . Smoking status: Former Smoker    Packs/day: 0.25    Types: Cigarettes    Quit date: 03/13/2018    Years since quitting: 1.7  . Smokeless tobacco: Never Used  . Tobacco comment: Enrolled in the 03/01 Quit Smoking Class Virtual  Substance Use Topics  . Alcohol use: Yes    Alcohol/week: 7.0 standard drinks    Types: 7 Shots of liquor per week    Comment: one margarita  every night  . Drug use: Yes    Frequency: 4.0 times per week    Types: Marijuana    Comment: occasional use. last use; 03/08/18    Current Outpatient Medications  Medication Sig Dispense Refill  . albuterol (PROVENTIL HFA;VENTOLIN HFA) 108 (90 Base) MCG/ACT inhaler Inhale 2 puffs into the lungs every 6 (six) hours as needed for shortness of breath. 18 g 4  . budesonide-formoterol (SYMBICORT) 160-4.5 MCG/ACT inhaler Inhale 2 puffs into the lungs 2 (two) times daily. 1  Inhaler 5  . fluticasone (FLONASE) 50 MCG/ACT nasal spray Place 1 spray into both nostrils 2 (two) times daily. 16 g 5  . guaiFENesin (MUCINEX) 600 MG 12 hr tablet Take 1 tablet (600 mg total) by mouth 2 (two) times daily as needed for cough or to loosen phlegm.    Marland Kitchen ibuprofen (ADVIL,MOTRIN) 200 MG tablet Take 400-600 mg by mouth daily as needed for mild pain (for pain.).     Marland Kitchen PARoxetine (PAXIL) 20 MG tablet Take 1 tablet (20 mg total) by mouth daily. 90 tablet 1  . propranolol ER (INDERAL LA) 60 MG 24 hr capsule Take 1 capsule by mouth once daily 30 capsule 3  . traZODone (DESYREL) 50 MG tablet Take 0.5-1 tablets (25-50 mg total) by mouth at bedtime as needed for sleep. 30 tablet 3   No current facility-administered medications for this visit.    Allergies  Allergen Reactions  . Aspirin Shortness Of Breath  . Morphine Sulfate Other (See Comments)    RED STREAKS ON ARMS.  Marland Kitchen Oxycodone Itching and Rash  . Glycopyrrolate     UNSPECIFIED REACTION  pt unsure if she is allergic.  . Lactose Intolerance (Gi) Nausea And Vomiting    Milk only.Patient states that she can eat cheese.  Marland Kitchen Penicillins Other (See Comments)     Heartburn, upset stomach only & tolerated AMOXIL after this reaction Has patient had a PCN reaction causing immediate rash, facial/tongue/throat swelling, SOB or lightheadedness with hypotension: No Has patient had a PCN reaction causing severe rash involving mucus membranes or skin necrosis: No Has patient had a PCN reaction that required hospitalization: No Has patient had a PCN reaction occurring within the last 10 years: #  #  #  YES  #  #  #   . Sulfa Antibiotics Tinitus    Review of Systems  Constitutional: Negative for activity change, chills, fever and unexpected weight change.  HENT: Negative for trouble swallowing and voice change.   Eyes: Negative for visual disturbance.  Respiratory: Positive for cough and shortness of breath. Negative for chest tightness and  wheezing.   Cardiovascular: Negative for chest pain and leg swelling.  Genitourinary: Negative for difficulty urinating and dysuria.  Neurological: Negative for seizures, syncope and weakness.  Hematological: Negative for adenopathy. Does not bruise/bleed easily.  All other systems reviewed  and are negative.    Afebrile, 143/74, HR 67, RR 20, O2 sat 99% on RA Physical Exam Constitutional:      General: She is not in acute distress. HENT:     Head: Normocephalic and atraumatic.  Eyes:     General: No scleral icterus.    Extraocular Movements: Extraocular movements intact.  Cardiovascular:     Rate and Rhythm: Normal rate and regular rhythm.     Heart sounds: Normal heart sounds. No murmur. No friction rub. No gallop.   Pulmonary:     Effort: Pulmonary effort is normal. No respiratory distress.     Breath sounds: No wheezing or rales.     Comments: Diminished BS bilaterally Abdominal:     General: There is no distension.     Palpations: Abdomen is soft.     Tenderness: There is no abdominal tenderness.  Musculoskeletal:        General: No swelling.     Cervical back: Neck supple. No tenderness.  Lymphadenopathy:     Cervical: No cervical adenopathy.  Skin:    General: Skin is warm and dry.  Neurological:     General: No focal deficit present.     Mental Status: She is alert and oriented to person, place, and time.     Cranial Nerves: No cranial nerve deficit.     Motor: No weakness.     Diagnostic Tests: CT CHEST WITH CONTRAST  TECHNIQUE: Multidetector CT imaging of the chest was performed during intravenous contrast administration.  CONTRAST:  54mL OMNIPAQUE IOHEXOL 300 MG/ML  SOLN  COMPARISON:  08/19/2019 chest CT.  FINDINGS: Cardiovascular: Normal heart size. No significant pericardial effusion/thickening. Atherosclerotic nonaneurysmal thoracic aorta. Normal caliber pulmonary arteries. No central pulmonary emboli.  Mediastinum/Nodes: No discrete  thyroid nodules. Unremarkable esophagus. No pathologically enlarged axillary, mediastinal or hilar lymph nodes.  Lungs/Pleura: No pneumothorax. No pleural effusion. Moderate centrilobular emphysema. Stable 0.5 cm ground-glass right upper lobe pulmonary nodule (series 7/image 52). Irregular solid 1.2 x 0.9 cm superior segment right lower lobe pulmonary nodule (series 7/image 61), previously 1.1 x 0.7 cm on 08/19/2019 chest CT, mildly increased in the interval, and clearly increased from prior chest CT studies, for example measuring 0.4 x 0.3 cm on 07/18/2017 CT and 0.8 x 0.6 cm on 11/07/2018. Stable postsurgical changes from wedge resection in posterior left upper lobe/anterior left lower lobe. No acute consolidative airspace disease or new significant pulmonary nodules.  Upper abdomen: No acute abnormality.  Musculoskeletal: No aggressive appearing focal osseous lesions. Moderate thoracic spondylosis.  IMPRESSION: 1. Continued slow growth of irregular solid 1.2 cm superior segment right lower lobe pulmonary nodule, worrisome for metachronous primary bronchogenic adenocarcinoma. 2. No evidence of local tumor recurrence in the left lung status post left upper lobe wedge resection. 3. No evidence of metastatic disease in the chest. 4. Aortic Atherosclerosis (ICD10-I70.0) and Emphysema (ICD10-J43.9).   Electronically Signed   By: Ilona Sorrel M.D.   On: 11/23/2019 14:58 NUCLEAR MEDICINE PET SKULL BASE TO THIGH  TECHNIQUE: 7.0 mCi F-18 FDG was injected intravenously. Full-ring PET imaging was performed from the skull base to thigh after the radiotracer. CT data was obtained and used for attenuation correction and anatomic localization.  Fasting blood glucose: 105 mg/dl  COMPARISON:  Multiple exams, including chest CT from 11/23/2019 PET-CT from 02/19/2018  FINDINGS: Mediastinal blood pool activity: SUV max 2.5  Liver activity: SUV max NA  NECK: No  significant abnormal hypermetabolic activity in this region.  Incidental CT findings:  Chronic right maxillary sinusitis.  CHEST: The bilobed right lower lobe pulmonary nodule measures 1.0 by 0.7 cm on image 68/2 and has a maximum SUV of 1.0.  The subtle ground-glass nodularity in the right upper lobe is not appreciably hypermetabolic.  Incidental CT findings: Coronary, aortic arch, and branch vessel atherosclerotic vascular disease. Postoperative findings in the left lung.  ABDOMEN/PELVIS: No significant abnormal hypermetabolic activity in this region.  Incidental CT findings: Aortoiliac atherosclerotic vascular disease.  SKELETON: No significant abnormal hypermetabolic activity in this region.  Incidental CT findings: none  IMPRESSION: 1. The bilobed right lower lobe pulmonary nodule has a maximum SUV of 1.0. Given the slow growth this is considerably likely to represent a low-grade adenocarcinoma despite the low metabolic activity. 2.  Aortic Atherosclerosis (ICD10-I70.0).  Coronary atherosclerosis. 3. Chronic right maxillary sinusitis.   Electronically Signed   By: Van Clines M.D.   On: 12/03/2019 14:31 I personally reviewed the CT and PET CT images and concur with the findings noted above  PFTs from 2019-   FEV1 0.97(37%) improved to 1.28(49%) post bronchodilator  Impression: [name] is a 64 year old woman with a history of tobacco abuse, COPD, lung cancer, chronic bronchitis, hepatitis C and aortic atherosclerosis.  She had a stage IB adenocarcinoma of the left upper lobe treated with a lingular segmentectomy almost 2 years ago.   She now has a slowly growing nodule in the superior segment of the right lower lobe. The nodule was not particularly hot on PET but has increased in size and is most likely a slow growing adenocarcinoma. Differential diagnosis includes metastatic disease, infectious and inflammatory nodules but a new slow  growing primary is the most likely diagnosis.   We discussed the options of radiographic observation, surgical resection and radiation therapy. She understands the advantages and disadvantages of each approach and strongly favors surgery.  I recommended we proceed with robotic Right VATS for a right lower lobe superior segmentectomy. I discussed the general nature of the procedure, the need for general anesthesia, the incisions to be used and the need for a drainage tube postoperatively. We discussed the expected hospital stay, overall recovery and short and long term outcomes. She understands the risks include, but are not limited to death, stroke, MI, DVT/PE, bleeding, possible need for transfusion, infections, prolonged air leaks, cardiac arrhythmias, as well as other organ system dysfunction including respiratory, renal, or GI complications.   She is familiar with the procedure from her prior surgery.  She accept the risks and agrees to proceed.  Will go ahead and start gabapentin preop as she had a good deal of neuropathic pain postoperatively the last time.  Her PFTs were reasonable the last time with an FEV1 of 49% postbronchodilators in June 2019. Will repeat.  Aortic atherosclerosis noted on CT- asymptomatic  Tobacco abuse- had quit smoking for over a year after her last operation but has restarted recently. I emphasized the importance of tobacco cessation.  Plan: PFTs with and without bronchodilators Robotic right VATS for right lower lobe superior segmentectomy We will call patient to schedule  Melrose Nakayama, MD [facility] [phone]

## 2019-12-10 NOTE — Progress Notes (Signed)
The proposed treatment discussed in cancer conference 12/10/19 is for discussion purpose only and is not a binding recommendation.  The patient was not physically examined nor present for their treatment options.  Therefore, final treatment plans cannot be decided.  

## 2019-12-10 NOTE — Progress Notes (Signed)
[facility] Telephone:([phone]) [phone]   Fax:([phone]) [phone]  OFFICE PROGRESS NOTE  [name], MD [place] [address]  DIAGNOSIS: Stage Ib (T2, N0, M0) non-small cell lung cancer, adenocarcinoma  PRIOR THERAPY: status post wedge resection of the left upper lobe nodule in addition to lingular segmentectomy and mediastinal lymph node sampling under the care of Dr. Roxan Hockey on March 13, 2018.  CURRENT THERAPY: Observation.  INTERVAL HISTORY: [name] 64 y.o. female returns to the clinic today for follow-up visit.  The patient is feeling fine today with no concerning complaints.  She denied having any chest pain, shortness of breath, cough or hemoptysis.  She denied having any fever or chills.  She has no nausea, vomiting, diarrhea or constipation.  She was found on previous CT scan of the chest to have a suspicious lesion in the right lung.  The patient had a PET scan performed recently and she is here today for evaluation and discussion of her PET scan results and recommendation regarding her condition.  MEDICAL HISTORY: Past Medical History:  Diagnosis Date  . Anxiety   . 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 Bronchitis   . Depression   . Dyspnea   . Emphysema, unspecified ([place])    per patient mild case  . Family history of adverse reaction to anesthesia    patient states sister has a hard time waking up from anesthesia.   . Hepatitis C   . lung ca dx'd 03/2018  . Tremor, unspecified    non-specific; takes inderal.     ALLERGIES:  is allergic to aspirin; morphine sulfate; oxycodone; glycopyrrolate; lactose intolerance (gi); penicillins; and sulfa antibiotics.  MEDICATIONS:  Current Outpatient Medications  Medication Sig Dispense Refill  . albuterol (PROVENTIL HFA;VENTOLIN HFA) 108 (90 Base) MCG/ACT inhaler Inhale 2 puffs into the lungs every 6 (six) hours as needed for shortness of breath. 18 g 4  . budesonide-formoterol  (SYMBICORT) 160-4.5 MCG/ACT inhaler Inhale 2 puffs into the lungs 2 (two) times daily. 1 Inhaler 5  . fluticasone (FLONASE) 50 MCG/ACT nasal spray Place 1 spray into both nostrils 2 (two) times daily. 16 g 5  . guaiFENesin (MUCINEX) 600 MG 12 hr tablet Take 1 tablet (600 mg total) by mouth 2 (two) times daily as needed for cough or to loosen phlegm.    Marland Kitchen ibuprofen (ADVIL,MOTRIN) 200 MG tablet Take 400-600 mg by mouth daily as needed for mild pain (for pain.).     Marland Kitchen PARoxetine (PAXIL) 20 MG tablet Take 1 tablet (20 mg total) by mouth daily. 90 tablet 1  . propranolol ER (INDERAL LA) 60 MG 24 hr capsule Take 1 capsule by mouth once daily 30 capsule 3  . traZODone (DESYREL) 50 MG tablet Take 0.5-1 tablets (25-50 mg total) by mouth at bedtime as needed for sleep. 30 tablet 3   No current facility-administered medications for this visit.    SURGICAL HISTORY:  Past Surgical History:  Procedure Laterality Date  . ABDOMINAL HYSTERECTOMY  1986   menorrhagia; hx of cryo for abnormal paps  . LOBECTOMY Left 03/13/2018   Procedure: Lingula section of left upper lobe lobectomy;  Surgeon: Melrose Nakayama, MD;  Location: [facility];  Service: Thoracic;  Laterality: Left;  Marland Kitchen VIDEO ASSISTED THORACOSCOPY (VATS)/WEDGE RESECTION Left 03/13/2018   Procedure: VIDEO ASSISTED THORACOSCOPY (VATS)/WEDGE RESECTION;  Surgeon: Melrose Nakayama, MD;  Location: [facility];  Service: Thoracic;  Laterality: Left;    REVIEW OF  SYSTEMS:  A comprehensive review of systems was negative.   PHYSICAL EXAMINATION: General appearance: alert, cooperative and no distress Head: Normocephalic, without obvious abnormality, atraumatic Neck: no adenopathy, no JVD, supple, symmetrical, trachea midline and thyroid not enlarged, symmetric, no tenderness/mass/nodules Lymph nodes: Cervical, supraclavicular, and axillary nodes normal. Resp: clear to auscultation bilaterally Back: symmetric, no curvature. ROM normal. No CVA tenderness. Cardio:  regular rate and rhythm, S1, S2 normal, no murmur, click, rub or gallop GI: soft, non-tender; bowel sounds normal; no masses,  no organomegaly Extremities: extremities normal, atraumatic, no cyanosis or edema  ECOG PERFORMANCE STATUS: 0 - Asymptomatic  Blood pressure (!) 143/74, pulse 67, temperature 98.2 F (36.8 C), temperature source Temporal, resp. rate 20, height 5\' 5"  (1.651 m), weight 116 lb 12.8 oz (53 kg), SpO2 99 %.  LABORATORY DATA: Lab Results  Component Value Date   WBC 5.9 11/23/2019   HGB 16.7 (H) 11/23/2019   HCT 49.0 (H) 11/23/2019   MCV 89.3 11/23/2019   PLT 227 11/23/2019      Chemistry      Component Value Date/Time   NA 142 11/23/2019 1120   K 3.9 11/23/2019 1120   CL 105 11/23/2019 1120   CO2 24 11/23/2019 1120   BUN 12 11/23/2019 1120   CREATININE 0.88 11/23/2019 1120   CREATININE 0.80 09/04/2019 1545      Component Value Date/Time   CALCIUM 9.5 11/23/2019 1120   ALKPHOS 75 11/23/2019 1120   AST 14 (L) 11/23/2019 1120   ALT 9 11/23/2019 1120   ALT 20 10/10/2017 1653   BILITOT 1.0 11/23/2019 1120       RADIOGRAPHIC STUDIES: CT Chest W Contrast  Result Date: 11/23/2019 CLINICAL DATA:  Stage IB left upper lobe lung adenocarcinoma status post wedge resection 03/13/2018, presenting for restaging. Interval observation. EXAM: CT CHEST WITH CONTRAST TECHNIQUE: Multidetector CT imaging of the chest was performed during intravenous contrast administration. CONTRAST:  74mL OMNIPAQUE IOHEXOL 300 MG/ML  SOLN COMPARISON:  08/19/2019 chest CT. FINDINGS: Cardiovascular: Normal heart size. No significant pericardial effusion/thickening. Atherosclerotic nonaneurysmal thoracic aorta. Normal caliber pulmonary arteries. No central pulmonary emboli. Mediastinum/Nodes: No discrete thyroid nodules. Unremarkable esophagus. No pathologically enlarged axillary, mediastinal or hilar lymph nodes. Lungs/Pleura: No pneumothorax. No pleural effusion. Moderate centrilobular  emphysema. Stable 0.5 cm ground-glass right upper lobe pulmonary nodule (series 7/image 52). Irregular solid 1.2 x 0.9 cm superior segment right lower lobe pulmonary nodule (series 7/image 61), previously 1.1 x 0.7 cm on 08/19/2019 chest CT, mildly increased in the interval, and clearly increased from prior chest CT studies, for example measuring 0.4 x 0.3 cm on 07/18/2017 CT and 0.8 x 0.6 cm on 11/07/2018. Stable postsurgical changes from wedge resection in posterior left upper lobe/anterior left lower lobe. No acute consolidative airspace disease or new significant pulmonary nodules. Upper abdomen: No acute abnormality. Musculoskeletal: No aggressive appearing focal osseous lesions. Moderate thoracic spondylosis. IMPRESSION: 1. Continued slow growth of irregular solid 1.2 cm superior segment right lower lobe pulmonary nodule, worrisome for metachronous primary bronchogenic adenocarcinoma. 2. No evidence of local tumor recurrence in the left lung status post left upper lobe wedge resection. 3. No evidence of metastatic disease in the chest. 4. Aortic Atherosclerosis (ICD10-I70.0) and Emphysema (ICD10-J43.9). Electronically Signed   By: Ilona Sorrel M.D.   On: 11/23/2019 14:58   NM PET Image Restag (PS) Skull Base To Thigh  Result Date: 12/03/2019 CLINICAL DATA:  Subsequent treatment strategy for non-small cell lung cancer. EXAM: NUCLEAR MEDICINE PET SKULL BASE TO  THIGH TECHNIQUE: 7.0 mCi F-18 FDG was injected intravenously. Full-ring PET imaging was performed from the skull base to thigh after the radiotracer. CT data was obtained and used for attenuation correction and anatomic localization. Fasting blood glucose: 105 mg/dl COMPARISON:  Multiple exams, including chest CT from 11/23/2019 PET-CT from 02/19/2018 FINDINGS: Mediastinal blood pool activity: SUV max 2.5 Liver activity: SUV max NA NECK: No significant abnormal hypermetabolic activity in this region. Incidental CT findings: Chronic right maxillary  sinusitis. CHEST: The bilobed right lower lobe pulmonary nodule measures 1.0 by 0.7 cm on image 68/2 and has a maximum SUV of 1.0. The subtle ground-glass nodularity in the right upper lobe is not appreciably hypermetabolic. Incidental CT findings: Coronary, aortic arch, and branch vessel atherosclerotic vascular disease. Postoperative findings in the left lung. ABDOMEN/PELVIS: No significant abnormal hypermetabolic activity in this region. Incidental CT findings: Aortoiliac atherosclerotic vascular disease. SKELETON: No significant abnormal hypermetabolic activity in this region. Incidental CT findings: none IMPRESSION: 1. The bilobed right lower lobe pulmonary nodule has a maximum SUV of 1.0. Given the slow growth this is considerably likely to represent a low-grade adenocarcinoma despite the low metabolic activity. 2.  Aortic Atherosclerosis (ICD10-I70.0).  Coronary atherosclerosis. 3. Chronic right maxillary sinusitis. Electronically Signed   By: Van Clines M.D.   On: 12/03/2019 14:31    ASSESSMENT AND PLAN: This is a very pleasant 63 years old white female with history of stage Ib non-small cell lung cancer, adenocarcinoma status post left upper segmental resection in addition to lingulectomy with mediastinal lymph node dissection in 2019.  The patient has a suspicious bilobed right lower lobe pulmonary nodule.  The PET scan showed no significant metabolic activity but the lesion is still suspicious for low-grade adenocarcinoma. We arranged for the patient to see Dr. Roxan Hockey today for evaluation and consideration of surgical resection. I will arrange for the patient to come back for follow-up visit in 4 months for evaluation and repeat CT scan of the chest for restaging of her disease hopefully after her surgical resection. She was advised to call immediately if she has any concerning symptoms in the interval. The patient voices understanding of current disease status and treatment options  and is in agreement with the current care plan.  All questions were answered. The patient knows to call the clinic with any problems, questions or concerns. We can certainly see the patient much sooner if necessary.  Disclaimer: This note was dictated with voice recognition software. Similar sounding words can inadvertently be transcribed and may not be corrected upon review.

## 2019-12-10 NOTE — H&P (View-Only) (Signed)
PCP is Patricia Macadam, MD Referring Provider is Patricia Bears, MD    HPI: [name] is sent for consultation re: a new right lower lobe lung nodule.  [name] is a 64 yo woman with a history of tobacco abuse, COPD, stage IB(T2N0) adenocarcinoma, chronic bronchitis, and hepatitis C. She had a lingular segmentectomy for a stage IB adenocarcinoma in June of 2019. She did not require adjuvant chemotherapy and has been followed since.  She had neuropathic pain postop which responded to gabapentin.  She has been feeling well. She had quit smoking but recently has started smoking again. She is currently at about 1/2 ppd. She denies chest pain, pressure and tightness. Occasional wheezing and chronic nonproductive cough. She does get SOB with exertion and has to take a cart to get around [place] but she can walk up a flight of stairs without stopping.  She recently had a CT for follow up which showed an increase in the size of a RLL nodule. There was a smaller GGO in the RUL that is unchanged. There was no adenopathy. A PET CT showed minimal uptake in the RLL nodule.  [name] Score: At the time of surgery this patient's most appropriate activity status/level should be described as: []     0    Normal activity, no symptoms []     1    Restricted in physical strenuous activity but ambulatory, able to do out light work [x]     2    Ambulatory and capable of self care, unable to do work activities, up and about >50 % of waking hours                              []     3    Only limited self care, in bed greater than 50% of waking hours []     4    Completely disabled, no self care, confined to bed or chair []     5    Moribund  Past Medical History:  Diagnosis Date  . Anxiety   . 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 Bronchitis   . Depression   . Dyspnea   . Emphysema, unspecified ([place])    per patient mild case  . Family history of adverse reaction to anesthesia    patient states sister has a  hard time waking up from anesthesia.   . Hepatitis C   . lung ca dx'd 03/2018  . Tremor, unspecified    non-specific; takes inderal.     Past Surgical History:  Procedure Laterality Date  . ABDOMINAL HYSTERECTOMY  1986   menorrhagia; hx of cryo for abnormal paps  . LOBECTOMY Left 03/13/2018   Procedure: Lingula section of left upper lobe lobectomy;  Surgeon: Patricia Nakayama, MD;  Location: [facility];  Service: Thoracic;  Laterality: Left;  Marland Kitchen VIDEO ASSISTED THORACOSCOPY (VATS)/WEDGE RESECTION Left 03/13/2018   Procedure: VIDEO ASSISTED THORACOSCOPY (VATS)/WEDGE RESECTION;  Surgeon: Patricia Nakayama, MD;  Location: [facility] OR;  Service: Thoracic;  Laterality: Left;    Family History  Problem Relation Age of Onset  . Asthma Mother   . Alzheimer's disease Mother 15  . Hypertension Mother   . Other Father        shot   . Neuropathy Sister        face  . Cancer Maternal Aunt        breast  . Cancer Paternal Aunt   .  Cancer Paternal Grandmother        abdominal  . Lung cancer Paternal Uncle     Social History Social History   Tobacco Use  . Smoking status: Former Smoker    Packs/day: 0.25    Types: Cigarettes    Quit date: 03/13/2018    Years since quitting: 1.7  . Smokeless tobacco: Never Used  . Tobacco comment: Enrolled in the 03/01 Quit Smoking Class Virtual  Substance Use Topics  . Alcohol use: Yes    Alcohol/week: 7.0 standard drinks    Types: 7 Shots of liquor per week    Comment: one margarita  every night  . Drug use: Yes    Frequency: 4.0 times per week    Types: Marijuana    Comment: occasional use. last use; 03/08/18    Current Outpatient Medications  Medication Sig Dispense Refill  . albuterol (PROVENTIL HFA;VENTOLIN HFA) 108 (90 Base) MCG/ACT inhaler Inhale 2 puffs into the lungs every 6 (six) hours as needed for shortness of breath. 18 g 4  . budesonide-formoterol (SYMBICORT) 160-4.5 MCG/ACT inhaler Inhale 2 puffs into the lungs 2 (two) times daily. 1  Inhaler 5  . fluticasone (FLONASE) 50 MCG/ACT nasal spray Place 1 spray into both nostrils 2 (two) times daily. 16 g 5  . guaiFENesin (MUCINEX) 600 MG 12 hr tablet Take 1 tablet (600 mg total) by mouth 2 (two) times daily as needed for cough or to loosen phlegm.    Marland Kitchen ibuprofen (ADVIL,MOTRIN) 200 MG tablet Take 400-600 mg by mouth daily as needed for mild pain (for pain.).     Marland Kitchen PARoxetine (PAXIL) 20 MG tablet Take 1 tablet (20 mg total) by mouth daily. 90 tablet 1  . propranolol ER (INDERAL LA) 60 MG 24 hr capsule Take 1 capsule by mouth once daily 30 capsule 3  . traZODone (DESYREL) 50 MG tablet Take 0.5-1 tablets (25-50 mg total) by mouth at bedtime as needed for sleep. 30 tablet 3   No current facility-administered medications for this visit.    Allergies  Allergen Reactions  . Aspirin Shortness Of Breath  . Morphine Sulfate Other (See Comments)    RED STREAKS ON ARMS.  Marland Kitchen Oxycodone Itching and Rash  . Glycopyrrolate     UNSPECIFIED REACTION  pt unsure if she is allergic.  . Lactose Intolerance (Gi) Nausea And Vomiting    Milk only.Patient states that she can eat cheese.  Marland Kitchen Penicillins Other (See Comments)     Heartburn, upset stomach only & tolerated AMOXIL after this reaction Has patient had a PCN reaction causing immediate rash, facial/tongue/throat swelling, SOB or lightheadedness with hypotension: No Has patient had a PCN reaction causing severe rash involving mucus membranes or skin necrosis: No Has patient had a PCN reaction that required hospitalization: No Has patient had a PCN reaction occurring within the last 10 years: #  #  #  YES  #  #  #   . Sulfa Antibiotics Tinitus    Review of Systems  Constitutional: Negative for activity change, chills, fever and unexpected weight change.  HENT: Negative for trouble swallowing and voice change.   Eyes: Negative for visual disturbance.  Respiratory: Positive for cough and shortness of breath. Negative for chest tightness and  wheezing.   Cardiovascular: Negative for chest pain and leg swelling.  Genitourinary: Negative for difficulty urinating and dysuria.  Neurological: Negative for seizures, syncope and weakness.  Hematological: Negative for adenopathy. Does not bruise/bleed easily.  All other systems reviewed  and are negative.    Afebrile, 143/74, HR 67, RR 20, O2 sat 99% on RA Physical Exam Constitutional:      General: She is not in acute distress. HENT:     Head: Normocephalic and atraumatic.  Eyes:     General: No scleral icterus.    Extraocular Movements: Extraocular movements intact.  Cardiovascular:     Rate and Rhythm: Normal rate and regular rhythm.     Heart sounds: Normal heart sounds. No murmur. No friction rub. No gallop.   Pulmonary:     Effort: Pulmonary effort is normal. No respiratory distress.     Breath sounds: No wheezing or rales.     Comments: Diminished BS bilaterally Abdominal:     General: There is no distension.     Palpations: Abdomen is soft.     Tenderness: There is no abdominal tenderness.  Musculoskeletal:        General: No swelling.     Cervical back: Neck supple. No tenderness.  Lymphadenopathy:     Cervical: No cervical adenopathy.  Skin:    General: Skin is warm and dry.  Neurological:     General: No focal deficit present.     Mental Status: She is alert and oriented to person, place, and time.     Cranial Nerves: No cranial nerve deficit.     Motor: No weakness.     Diagnostic Tests: CT CHEST WITH CONTRAST  TECHNIQUE: Multidetector CT imaging of the chest was performed during intravenous contrast administration.  CONTRAST:  13mL OMNIPAQUE IOHEXOL 300 MG/ML  SOLN  COMPARISON:  08/19/2019 chest CT.  FINDINGS: Cardiovascular: Normal heart size. No significant pericardial effusion/thickening. Atherosclerotic nonaneurysmal thoracic aorta. Normal caliber pulmonary arteries. No central pulmonary emboli.  Mediastinum/Nodes: No discrete  thyroid nodules. Unremarkable esophagus. No pathologically enlarged axillary, mediastinal or hilar lymph nodes.  Lungs/Pleura: No pneumothorax. No pleural effusion. Moderate centrilobular emphysema. Stable 0.5 cm ground-glass right upper lobe pulmonary nodule (series 7/image 52). Irregular solid 1.2 x 0.9 cm superior segment right lower lobe pulmonary nodule (series 7/image 61), previously 1.1 x 0.7 cm on 08/19/2019 chest CT, mildly increased in the interval, and clearly increased from prior chest CT studies, for example measuring 0.4 x 0.3 cm on 07/18/2017 CT and 0.8 x 0.6 cm on 11/07/2018. Stable postsurgical changes from wedge resection in posterior left upper lobe/anterior left lower lobe. No acute consolidative airspace disease or new significant pulmonary nodules.  Upper abdomen: No acute abnormality.  Musculoskeletal: No aggressive appearing focal osseous lesions. Moderate thoracic spondylosis.  IMPRESSION: 1. Continued slow growth of irregular solid 1.2 cm superior segment right lower lobe pulmonary nodule, worrisome for metachronous primary bronchogenic adenocarcinoma. 2. No evidence of local tumor recurrence in the left lung status post left upper lobe wedge resection. 3. No evidence of metastatic disease in the chest. 4. Aortic Atherosclerosis (ICD10-I70.0) and Emphysema (ICD10-J43.9).   Electronically Signed   By: Ilona Sorrel M.D.   On: 11/23/2019 14:58 NUCLEAR MEDICINE PET SKULL BASE TO THIGH  TECHNIQUE: 7.0 mCi F-18 FDG was injected intravenously. Full-ring PET imaging was performed from the skull base to thigh after the radiotracer. CT data was obtained and used for attenuation correction and anatomic localization.  Fasting blood glucose: 105 mg/dl  COMPARISON:  Multiple exams, including chest CT from 11/23/2019 PET-CT from 02/19/2018  FINDINGS: Mediastinal blood pool activity: SUV max 2.5  Liver activity: SUV max NA  NECK: No  significant abnormal hypermetabolic activity in this region.  Incidental CT findings:  Chronic right maxillary sinusitis.  CHEST: The bilobed right lower lobe pulmonary nodule measures 1.0 by 0.7 cm on image 68/2 and has a maximum SUV of 1.0.  The subtle ground-glass nodularity in the right upper lobe is not appreciably hypermetabolic.  Incidental CT findings: Coronary, aortic arch, and branch vessel atherosclerotic vascular disease. Postoperative findings in the left lung.  ABDOMEN/PELVIS: No significant abnormal hypermetabolic activity in this region.  Incidental CT findings: Aortoiliac atherosclerotic vascular disease.  SKELETON: No significant abnormal hypermetabolic activity in this region.  Incidental CT findings: none  IMPRESSION: 1. The bilobed right lower lobe pulmonary nodule has a maximum SUV of 1.0. Given the slow growth this is considerably likely to represent a low-grade adenocarcinoma despite the low metabolic activity. 2.  Aortic Atherosclerosis (ICD10-I70.0).  Coronary atherosclerosis. 3. Chronic right maxillary sinusitis.   Electronically Signed   By: Van Clines M.D.   On: 12/03/2019 14:31 I personally reviewed the CT and PET CT images and concur with the findings noted above  PFTs from 2019-   FEV1 0.97(37%) improved to 1.28(49%) post bronchodilator  Impression: [name] is a 64 year old woman with a history of tobacco abuse, COPD, lung cancer, chronic bronchitis, hepatitis C and aortic atherosclerosis.  She had a stage IB adenocarcinoma of the left upper lobe treated with a lingular segmentectomy almost 2 years ago.   She now has a slowly growing nodule in the superior segment of the right lower lobe. The nodule was not particularly hot on PET but has increased in size and is most likely a slow growing adenocarcinoma. Differential diagnosis includes metastatic disease, infectious and inflammatory nodules but a new slow  growing primary is the most likely diagnosis.   We discussed the options of radiographic observation, surgical resection and radiation therapy. She understands the advantages and disadvantages of each approach and strongly favors surgery.  I recommended we proceed with robotic Right VATS for a right lower lobe superior segmentectomy. I discussed the general nature of the procedure, the need for general anesthesia, the incisions to be used and the need for a drainage tube postoperatively. We discussed the expected hospital stay, overall recovery and short and long term outcomes. She understands the risks include, but are not limited to death, stroke, MI, DVT/PE, bleeding, possible need for transfusion, infections, prolonged air leaks, cardiac arrhythmias, as well as other organ system dysfunction including respiratory, renal, or GI complications.   She is familiar with the procedure from her prior surgery.  She accept the risks and agrees to proceed.  Will go ahead and start gabapentin preop as she had a good deal of neuropathic pain postoperatively the last time.  Her PFTs were reasonable the last time with an FEV1 of 49% postbronchodilators in June 2019. Will repeat.  Aortic atherosclerosis noted on CT- asymptomatic  Tobacco abuse- had quit smoking for over a year after her last operation but has restarted recently. I emphasized the importance of tobacco cessation.  Plan: PFTs with and without bronchodilators Robotic right VATS for right lower lobe superior segmentectomy We will call patient to schedule  Patricia Nakayama, MD [facility] [phone]

## 2019-12-10 NOTE — Telephone Encounter (Signed)
Oncology Nurse Navigator Documentation  Oncology Nurse Navigator Flowsheets 12/10/2019  Abnormal Finding Date -  Confirmed Diagnosis Date -  Expected Surgery Date -  Navigator Location [facility]  Referral Date to RadOnc/MedOnc -  Navigator Encounter Type Telephone/I called patient to update her on appt to see Dr. Julien Nordmann and Dr. Roxan Hockey at [facility] today.  She verbalized understanding of appt time change.   Telephone Outgoing Call  Treatment Phase Abnormal Scans  Barriers/Navigation Needs Coordination of Care;Education  Education Other  Interventions Coordination of Care;Education  Acuity Level 2-Minimal Needs (1-2 Barriers Identified)  Coordination of Care Appts  Education Method Verbal  Time Spent with Patient 15

## 2019-12-11 ENCOUNTER — Telehealth: Payer: Self-pay | Admitting: Internal Medicine

## 2019-12-11 ENCOUNTER — Encounter (HOSPITAL_COMMUNITY): Payer: BC Managed Care – PPO

## 2019-12-11 ENCOUNTER — Other Ambulatory Visit (HOSPITAL_COMMUNITY)
Admission: RE | Admit: 2019-12-11 | Discharge: 2019-12-11 | Disposition: A | Discharge status: Home / Self Care | Payer: BC Managed Care – PPO | Source: Ambulatory Visit | Attending: Thoracic Surgery (Cardiothoracic Vascular Surgery) | Admitting: Thoracic Surgery (Cardiothoracic Vascular Surgery)

## 2019-12-11 ENCOUNTER — Other Ambulatory Visit: Payer: Self-pay | Admitting: *Deleted

## 2019-12-11 DIAGNOSIS — R911 Solitary pulmonary nodule: Secondary | ICD-10-CM

## 2019-12-11 DIAGNOSIS — Z20822 Contact with and (suspected) exposure to covid-19: Secondary | ICD-10-CM | POA: Diagnosis not present

## 2019-12-11 DIAGNOSIS — Z01812 Encounter for preprocedural laboratory examination: Secondary | ICD-10-CM | POA: Insufficient documentation

## 2019-12-11 NOTE — Telephone Encounter (Signed)
Scheduled per los. Called and left msg. Mailed printout  °

## 2019-12-11 NOTE — Progress Notes (Addendum)
Your procedure is scheduled on Friday December 18, 2019.  Report to [facility] Main Entrance "A" at 05:30 A.M., and check in at the Admitting office.  Call this number if you have problems the morning of surgery:  [phone]  Call [phone] if you have any questions prior to your surgery date Monday-Friday 8am-4pm    Remember:  Do not eat or drink after midnight the night before your surgery     Take these medicines the morning of surgery with A SIP OF WATER:  gabapentin (NEURONTIN)   omeprazole (PRILOSEC)  PARoxetine (PAXIL)  propranolol ER (INDERAL LA)   Take only as needed:  albuterol (PROVENTIL HFA;VENTOLIN HFA)  budesonide-formoterol (SYMBICORT) fluticasone (FLONASE)   Please bring all inhalers with you the day of surgery.    As of today, STOP taking any Aspirin (unless otherwise instructed by your surgeon), Aleve, Naproxen, Ibuprofen, Motrin, Advil, Goody's, BC's, all herbal medications, fish oil, and all vitamins.    The Morning of Surgery  Do not wear jewelry, make-up or nail polish.  Do not wear lotions, powders, perfume, or deodorant  Do not shave 48 hours prior to surgery.   Do not bring valuables to the hospital.  [facility] is not responsible for any belongings or valuables.  If you are a smoker, DO NOT Smoke 24 hours prior to surgery  If you wear a CPAP at night please bring your mask the morning of surgery   Remember that you must have someone to transport you home after your surgery, and remain with you for 24 hours if you are discharged the same day.   Please bring cases for contacts, glasses, hearing aids, dentures or bridgework because it cannot be worn into surgery.    Leave your suitcase in the car.  After surgery it may be brought to your room.  For patients admitted to the hospital, discharge time will be determined by your treatment team.  Patients discharged the day of surgery will not be allowed to drive home.    Special  instructions:   [facility]- Preparing For Surgery  Before surgery, you can play an important role. Because skin is not sterile, your skin needs to be as free of germs as possible. You can reduce the number of germs on your skin by washing with CHG (chlorahexidine gluconate) Soap before surgery.  CHG is an antiseptic cleaner which kills germs and bonds with the skin to continue killing germs even after washing.    Oral Hygiene is also important to reduce your risk of infection.  Remember - BRUSH YOUR TEETH THE MORNING OF SURGERY WITH YOUR REGULAR TOOTHPASTE  Please do not use if you have an allergy to CHG or antibacterial soaps. If your skin becomes reddened/irritated stop using the CHG.  Do not shave (including legs and underarms) for at least 48 hours prior to first CHG shower. It is OK to shave your face.  Please follow these instructions carefully.   1. Shower the NIGHT BEFORE SURGERY and the MORNING OF SURGERY with CHG Soap.   2. If you chose to wash your hair, wash your hair first as usual with your normal shampoo.  3. After you shampoo, rinse your hair and body thoroughly to remove the shampoo.  4. Use CHG as you would any other liquid soap. You can apply CHG directly to the skin and wash gently with a scrungie or a clean washcloth.   5. Apply the CHG Soap to your body ONLY FROM THE  NECK DOWN.  Do not use on open wounds or open sores. Avoid contact with your eyes, ears, mouth and genitals (private parts). Wash Face and genitals (private parts)  with your normal soap.   6. Wash thoroughly, paying special attention to the area where your surgery will be performed.  7. Thoroughly rinse your body with warm water from the neck down.  8. DO NOT shower/wash with your normal soap after using and rinsing off the CHG Soap.  9. Pat yourself dry with a CLEAN TOWEL.  10. Wear CLEAN PAJAMAS to bed the night before surgery, wear comfortable clothes the morning of surgery  11. Place CLEAN  SHEETS on your bed the night of your first shower and DO NOT SLEEP WITH PETS.    Day of Surgery:  Please shower the morning of surgery with the CHG soap Do not apply any deodorants/lotions. Please wear clean clothes to the hospital/surgery center.   Remember to brush your teeth WITH YOUR REGULAR TOOTHPASTE.   Please read over the following fact sheets that you were given.

## 2019-12-12 LAB — SARS CORONAVIRUS 2 (TAT 6-24 HRS): SARS Coronavirus 2: NEGATIVE

## 2019-12-14 ENCOUNTER — Ambulatory Visit (HOSPITAL_COMMUNITY)
Admission: RE | Admit: 2019-12-14 | Discharge: 2019-12-14 | Disposition: A | Discharge status: Home / Self Care | Payer: BC Managed Care – PPO | Source: Ambulatory Visit | Attending: Thoracic Surgery (Cardiothoracic Vascular Surgery) | Admitting: Thoracic Surgery (Cardiothoracic Vascular Surgery)

## 2019-12-14 ENCOUNTER — Other Ambulatory Visit (HOSPITAL_COMMUNITY): Payer: BC Managed Care – PPO

## 2019-12-14 ENCOUNTER — Other Ambulatory Visit: Payer: Self-pay

## 2019-12-14 ENCOUNTER — Encounter (HOSPITAL_COMMUNITY): Payer: Self-pay

## 2019-12-14 ENCOUNTER — Encounter (HOSPITAL_COMMUNITY)
Admission: RE | Admit: 2019-12-14 | Discharge: 2019-12-14 | Disposition: A | Discharge status: Home / Self Care | Payer: BC Managed Care – PPO | Source: Ambulatory Visit | Attending: Thoracic Surgery (Cardiothoracic Vascular Surgery) | Admitting: Thoracic Surgery (Cardiothoracic Vascular Surgery)

## 2019-12-14 DIAGNOSIS — R911 Solitary pulmonary nodule: Secondary | ICD-10-CM

## 2019-12-14 LAB — PULMONARY FUNCTION TEST
DL/VA % pred: 55 %
DL/VA: 2.32 ml/min/mmHg/L
DLCO cor % pred: 44 %
DLCO cor: 9.24 ml/min/mmHg
DLCO unc % pred: 48 %
DLCO unc: 10.06 ml/min/mmHg
FEF 25-75 Post: 0.5 L/sec
FEF 25-75 Pre: 0.28 L/sec
FEF2575-%Change-Post: 77 %
FEF2575-%Pred-Post: 22 %
FEF2575-%Pred-Pre: 12 %
FEV1-%Change-Post: 20 %
FEV1-%Pred-Post: 40 %
FEV1-%Pred-Pre: 33 %
FEV1-Post: 1.02 L
FEV1-Pre: 0.85 L
FEV1FVC-%Change-Post: -7 %
FEV1FVC-%Pred-Pre: 54 %
FEV6-%Change-Post: 25 %
FEV6-%Pred-Post: 67 %
FEV6-%Pred-Pre: 54 %
FEV6-Post: 2.16 L
FEV6-Pre: 1.72 L
FEV6FVC-%Change-Post: -4 %
FEV6FVC-%Pred-Post: 85 %
FEV6FVC-%Pred-Pre: 89 %
FVC-%Change-Post: 31 %
FVC-%Pred-Post: 79 %
FVC-%Pred-Pre: 60 %
FVC-Post: 2.63 L
FVC-Pre: 2.01 L
Post FEV1/FVC ratio: 39 %
Post FEV6/FVC ratio: 82 %
Pre FEV1/FVC ratio: 42 %
Pre FEV6/FVC Ratio: 86 %
RV % pred: 168 %
RV: 3.57 L
TLC % pred: 111 %
TLC: 5.78 L

## 2019-12-14 LAB — COMPREHENSIVE METABOLIC PANEL
ALT: 14 U/L (ref 0–44)
AST: 22 U/L (ref 15–41)
Albumin: 4.3 g/dL (ref 3.5–5.0)
Alkaline Phosphatase: 80 U/L (ref 38–126)
Anion gap: 9 (ref 5–15)
BUN: 6 mg/dL — ABNORMAL LOW (ref 8–23)
CO2: 25 mmol/L (ref 22–32)
Calcium: 9.3 mg/dL (ref 8.9–10.3)
Chloride: 104 mmol/L (ref 98–111)
Creatinine, Ser: 0.98 mg/dL (ref 0.44–1.00)
GFR calc Af Amer: >60 mL/min (ref >60)
GFR calc non Af Amer: >60 mL/min (ref >60)
Glucose, Bld: 118 mg/dL — ABNORMAL HIGH (ref 70–99)
Potassium: 3.6 mmol/L (ref 3.5–5.1)
Sodium: 138 mmol/L (ref 135–145)
Total Bilirubin: 1.3 mg/dL — ABNORMAL HIGH (ref 0.3–1.2)
Total Protein: 7.6 g/dL (ref 6.5–8.1)

## 2019-12-14 LAB — URINALYSIS, ROUTINE W REFLEX MICROSCOPIC
Bilirubin Urine: NEGATIVE
Glucose, UA: NEGATIVE mg/dL
Hgb urine dipstick: NEGATIVE
Ketones, ur: NEGATIVE mg/dL
Leukocytes,Ua: NEGATIVE
Nitrite: NEGATIVE
Protein, ur: NEGATIVE mg/dL
Specific Gravity, Urine: 1.013 (ref 1.005–1.030)
pH: 5 (ref 5.0–8.0)

## 2019-12-14 LAB — CBC
HCT: 50.3 % — ABNORMAL HIGH (ref 36.0–46.0)
Hemoglobin: 16.9 g/dL — ABNORMAL HIGH (ref 12.0–15.0)
MCH: 30.4 pg (ref 26.0–34.0)
MCHC: 33.6 g/dL (ref 30.0–36.0)
MCV: 90.5 fL (ref 80.0–100.0)
Platelets: 247 10*3/uL (ref 150–400)
RBC: 5.56 MIL/uL — ABNORMAL HIGH (ref 3.87–5.11)
RDW: 12.5 % (ref 11.5–15.5)
WBC: 5.8 10*3/uL (ref 4.0–10.5)
nRBC: 0 % (ref 0.0–0.2)

## 2019-12-14 LAB — SURGICAL PCR SCREEN
MRSA, PCR: NEGATIVE
Staphylococcus aureus: NEGATIVE

## 2019-12-14 LAB — TYPE AND SCREEN
ABO/RH(D): O POS
Antibody Screen: NEGATIVE

## 2019-12-14 LAB — PROTIME-INR
INR: 1 (ref 0.8–1.2)
Prothrombin Time: 12.6 seconds (ref 11.4–15.2)

## 2019-12-14 LAB — APTT: aPTT: 28 seconds (ref 24–36)

## 2019-12-14 MED ORDER — ALBUTEROL SULFATE (2.5 MG/3ML) 0.083% IN NEBU
2.5000 mg | INHALATION_SOLUTION | Freq: Once | RESPIRATORY_TRACT | Status: AC
  Administered 2019-12-14: 2.5 mg via RESPIRATORY_TRACT

## 2019-12-14 NOTE — Progress Notes (Signed)
PCP Ethlyn Gallery, MD Cardiologist - pt denies - she was seen back around [place] for a heart murmur, but doctor she saw said "it was a functional murmur, so it wasn't anything to worry about". Pt states physician who saw her "I think it's called [facility] now".   Chest x-ray - 12/14/19 EKG - 12/14/19 Stress Test - 11/2016 ECHO - "I think they did an echo back when they found my murmur"  Cardiac Cath - pt denies    COVID TEST- scheduled 12/16/19    Coronavirus Screening  Have you experienced the following symptoms:  Cough yes/no: No  Fever (>100.63F)  yes/no: No Runny nose yes/no: No Sore throat yes/no: No Difficulty breathing/shortness of breath  yes/no: No  Have you or a family member traveled in the last 14 days and where? yes/no: No   If the patient indicates "YES" to the above questions, their PAT will be rescheduled to limit the exposure to others and, the surgeon will be notified. THE PATIENT WILL NEED TO BE ASYMPTOMATIC FOR 14 DAYS.   If the patient is not experiencing any of these symptoms, the PAT nurse will instruct them to NOT bring anyone with them to their appointment since they may have these symptoms or traveled as well.   Please remind your patients and families that hospital visitation restrictions are in effect and the importance of the restrictions.     Anesthesia review: no  Patient denies shortness of breath, fever, cough and chest pain at PAT appointment   All instructions explained to the patient, with a verbal understanding of the material. Patient agrees to go over the instructions while at home for a better understanding. Patient also instructed to self quarantine after being tested for COVID-19. The opportunity to ask questions was provided.

## 2019-12-15 NOTE — Progress Notes (Signed)
Anesthesia Chart Review:  Hx of COPD, stage IB(T2N0) adenocarcinoma, chronic bronchitis, and hepatitis C. She had a lingular segmentectomy for a stage IB adenocarcinoma in June of 2019. She did not require adjuvant chemotherapy and has been followed since. Recent followup CT showed an increase in the size of a RLL nodule concerning for low grade adenocarcinoma.   Hep C s/p treatment with Harvoni.  Preop labs reviewed, unremarkable.   EKG 12/14/19: Sinus bradycardia. Rate 56.  PFT 12/14/19: FVC-%Pred-Pre Latest Units: % 60  FEV1-%Pred-Pre Latest Units: % 33  FEV1FVC-%Pred-Pre Latest Units: % 54  TLC % pred Latest Units: % 111  DLCO cor % pred Latest Units: % [place], PA-C [facility] Short Stay Center/Anesthesiology Phone [phone] 12/15/2019 8:52 AM

## 2019-12-15 NOTE — Anesthesia Preprocedure Evaluation (Addendum)
Anesthesia Evaluation  Patient identified by MRN, date of birth, ID band Patient awake    Reviewed: Allergy & Precautions, NPO status , Patient's Chart, lab work & pertinent test results  History of Anesthesia Complications Negative for: history of anesthetic complications  Airway Mallampati: II  TM Distance: >3 FB Neck ROM: Full    Dental  (+) Dental Advisory Given, Teeth Intact   Pulmonary shortness of breath, COPD, Patient abstained from smoking., former smoker,  RLL LUNG NODULES   breath sounds clear to auscultation       Cardiovascular negative cardio ROS   Rhythm:Regular     Neuro/Psych  Headaches, PSYCHIATRIC DISORDERS Anxiety Depression    GI/Hepatic GERD  ,(+) Hepatitis -, C  Endo/Other    Renal/GU      Musculoskeletal   Abdominal   Peds  Hematology   Anesthesia Other Findings Previous difficulty with double lumen placement  Reproductive/Obstetrics                            Anesthesia Physical Anesthesia Plan  ASA: II  Anesthesia Plan: General   Post-op Pain Management:    Induction: Intravenous  PONV Risk Score and Plan: 3 and Ondansetron and Dexamethasone  Airway Management Planned: Double Lumen EBT  Additional Equipment: Arterial line  Intra-op Plan:   Post-operative Plan: Extubation in OR  Informed Consent: I have reviewed the patients History and Physical, chart, labs and discussed the procedure including the risks, benefits and alternatives for the proposed anesthesia with the patient or authorized representative who has indicated his/her understanding and acceptance.     Dental advisory given  Plan Discussed with: CRNA and Surgeon  Anesthesia Plan Comments: (Hx of COPD, stage IB(T2N0) adenocarcinoma, chronic bronchitis, and hepatitis C. She had a lingular segmentectomy for a stage IB adenocarcinoma in June of 2019. She did not require adjuvant  chemotherapy and has been followed since. Recent followup CT showed an increase in the size of a RLL nodule concerning for low grade adenocarcinoma.   Hep C s/p treatment with Harvoni.  Preop labs reviewed, unremarkable.   EKG 12/14/19: Sinus bradycardia. Rate 56.  PFT 12/14/19: FVC-%Pred-Pre Latest Units: % 60 FEV1-%Pred-Pre Latest Units: % 33 FEV1FVC-%Pred-Pre Latest Units: % 54 TLC % pred Latest Units: % 111 DLCO cor % pred Latest Units: % 44 )       Anesthesia Quick Evaluation

## 2019-12-16 ENCOUNTER — Telehealth: Payer: Self-pay | Admitting: *Deleted

## 2019-12-16 ENCOUNTER — Other Ambulatory Visit (HOSPITAL_COMMUNITY)
Admission: RE | Admit: 2019-12-16 | Discharge: 2019-12-16 | Disposition: A | Discharge status: Home / Self Care | Payer: BC Managed Care – PPO | Source: Ambulatory Visit | Attending: Thoracic Surgery (Cardiothoracic Vascular Surgery) | Admitting: Thoracic Surgery (Cardiothoracic Vascular Surgery)

## 2019-12-16 DIAGNOSIS — Z01812 Encounter for preprocedural laboratory examination: Secondary | ICD-10-CM | POA: Insufficient documentation

## 2019-12-16 DIAGNOSIS — Z20822 Contact with and (suspected) exposure to covid-19: Secondary | ICD-10-CM | POA: Insufficient documentation

## 2019-12-16 LAB — SARS CORONAVIRUS 2 (TAT 6-24 HRS): SARS Coronavirus 2: NEGATIVE

## 2019-12-16 NOTE — Telephone Encounter (Signed)
Oncology Nurse Navigator Documentation  Oncology Nurse Navigator Flowsheets 12/16/2019  Abnormal Finding Date 11/03/2019  Confirmed Diagnosis Date -  Diagnosis Status Additional Work Up  Planned Course of Treatment Surgery  Phase of Treatment Surgery  Expected Surgery Date 12/18/2019  Navigator Follow Up Date: 12/21/2019  Navigator Follow Up Reason: Review Note  Navigator Location [facility]  Referral Date to RadOnc/MedOnc -  Navigator Encounter Type Telephone/I called and spoke with patient today.  She has gotten her pre screening completed and will have surgery on 3/19.  I asked if she has stopped smoking and she has.  I encouraged her to continue to not smoke and wished her luck with upcoming surgery.    Telephone Outgoing Call  Treatment Phase Abnormal Scans  Barriers/Navigation Needs Education  Education Smoking cessation;Other  Interventions Education;Psycho-Social Support  Acuity Level 2-Minimal Needs (1-2 Barriers Identified)  Coordination of Care -  Education Method Verbal  Time Spent with Patient 15

## 2019-12-18 ENCOUNTER — Encounter (HOSPITAL_COMMUNITY): Payer: Self-pay | Admitting: Thoracic Surgery (Cardiothoracic Vascular Surgery)

## 2019-12-18 ENCOUNTER — Other Ambulatory Visit: Payer: Self-pay

## 2019-12-18 ENCOUNTER — Inpatient Hospital Stay (HOSPITAL_COMMUNITY): Payer: BC Managed Care – PPO | Admitting: Physician Assistant

## 2019-12-18 ENCOUNTER — Inpatient Hospital Stay (HOSPITAL_COMMUNITY): Payer: BC Managed Care – PPO | Admitting: Certified Registered Nurse Anesthetist

## 2019-12-18 ENCOUNTER — Inpatient Hospital Stay (HOSPITAL_COMMUNITY): Payer: BC Managed Care – PPO

## 2019-12-18 ENCOUNTER — Encounter (HOSPITAL_COMMUNITY)
Admission: RE | Disposition: A | Discharge status: Home / Self Care | Payer: Self-pay | Source: Home / Self Care | Attending: Thoracic Surgery (Cardiothoracic Vascular Surgery)

## 2019-12-18 ENCOUNTER — Inpatient Hospital Stay (HOSPITAL_COMMUNITY)
Admission: RE | Admit: 2019-12-18 | Discharge: 2019-12-22 | DRG: 164 | Disposition: A | Discharge status: Home / Self Care | Payer: BC Managed Care – PPO | Attending: Thoracic Surgery (Cardiothoracic Vascular Surgery) | Admitting: Thoracic Surgery (Cardiothoracic Vascular Surgery)

## 2019-12-18 DIAGNOSIS — J320 Chronic maxillary sinusitis: Secondary | ICD-10-CM | POA: Diagnosis present

## 2019-12-18 DIAGNOSIS — Z801 Family history of malignant neoplasm of trachea, bronchus and lung: Secondary | ICD-10-CM | POA: Diagnosis not present

## 2019-12-18 DIAGNOSIS — Z886 Allergy status to analgesic agent status: Secondary | ICD-10-CM | POA: Diagnosis not present

## 2019-12-18 DIAGNOSIS — R251 Tremor, unspecified: Secondary | ICD-10-CM | POA: Diagnosis present

## 2019-12-18 DIAGNOSIS — D62 Acute posthemorrhagic anemia: Secondary | ICD-10-CM | POA: Diagnosis not present

## 2019-12-18 DIAGNOSIS — J939 Pneumothorax, unspecified: Secondary | ICD-10-CM | POA: Diagnosis not present

## 2019-12-18 DIAGNOSIS — J9382 Other air leak: Secondary | ICD-10-CM | POA: Diagnosis not present

## 2019-12-18 DIAGNOSIS — Z4682 Encounter for fitting and adjustment of non-vascular catheter: Secondary | ICD-10-CM

## 2019-12-18 DIAGNOSIS — E739 Lactose intolerance, unspecified: Secondary | ICD-10-CM | POA: Diagnosis present

## 2019-12-18 DIAGNOSIS — Z902 Acquired absence of lung [part of]: Secondary | ICD-10-CM

## 2019-12-18 DIAGNOSIS — Z85118 Personal history of other malignant neoplasm of bronchus and lung: Secondary | ICD-10-CM | POA: Diagnosis not present

## 2019-12-18 DIAGNOSIS — C3431 Malignant neoplasm of lower lobe, right bronchus or lung: Principal | ICD-10-CM | POA: Diagnosis present

## 2019-12-18 DIAGNOSIS — Z7951 Long term (current) use of inhaled steroids: Secondary | ICD-10-CM

## 2019-12-18 DIAGNOSIS — Z825 Family history of asthma and other chronic lower respiratory diseases: Secondary | ICD-10-CM | POA: Diagnosis not present

## 2019-12-18 DIAGNOSIS — B1920 Unspecified viral hepatitis C without hepatic coma: Secondary | ICD-10-CM | POA: Diagnosis present

## 2019-12-18 DIAGNOSIS — Z888 Allergy status to other drugs, medicaments and biological substances status: Secondary | ICD-10-CM | POA: Diagnosis not present

## 2019-12-18 DIAGNOSIS — Z87442 Personal history of urinary calculi: Secondary | ICD-10-CM | POA: Diagnosis not present

## 2019-12-18 DIAGNOSIS — Z882 Allergy status to sulfonamides status: Secondary | ICD-10-CM | POA: Diagnosis not present

## 2019-12-18 DIAGNOSIS — Z880 Allergy status to penicillin: Secondary | ICD-10-CM | POA: Diagnosis not present

## 2019-12-18 DIAGNOSIS — J449 Chronic obstructive pulmonary disease, unspecified: Secondary | ICD-10-CM | POA: Diagnosis present

## 2019-12-18 DIAGNOSIS — F17210 Nicotine dependence, cigarettes, uncomplicated: Secondary | ICD-10-CM | POA: Diagnosis present

## 2019-12-18 DIAGNOSIS — R911 Solitary pulmonary nodule: Secondary | ICD-10-CM

## 2019-12-18 DIAGNOSIS — Z885 Allergy status to narcotic agent status: Secondary | ICD-10-CM

## 2019-12-18 DIAGNOSIS — I700 Atherosclerosis of aorta: Secondary | ICD-10-CM | POA: Diagnosis present

## 2019-12-18 DIAGNOSIS — Z79899 Other long term (current) drug therapy: Secondary | ICD-10-CM

## 2019-12-18 DIAGNOSIS — M19049 Primary osteoarthritis, unspecified hand: Secondary | ICD-10-CM | POA: Diagnosis present

## 2019-12-18 DIAGNOSIS — I2510 Atherosclerotic heart disease of native coronary artery without angina pectoris: Secondary | ICD-10-CM | POA: Diagnosis present

## 2019-12-18 HISTORY — PX: NODE DISSECTION: SHX5269

## 2019-12-18 HISTORY — PX: INTERCOSTAL NERVE BLOCK: SHX5021

## 2019-12-18 HISTORY — DX: Acquired absence of lung (part of): Z90.2

## 2019-12-18 LAB — BLOOD GAS, ARTERIAL
Acid-Base Excess: 0.4 mmol/L (ref 0.0–2.0)
Bicarbonate: 25.1 mmol/L (ref 20.0–28.0)
FIO2: 21
O2 Saturation: 99 %
Patient temperature: 37
pCO2 arterial: 45.4 mmHg (ref 32.0–48.0)
pH, Arterial: 7.362 (ref 7.350–7.450)
pO2, Arterial: 186 mmHg — ABNORMAL HIGH (ref 83.0–108.0)

## 2019-12-18 SURGERY — LOBECTOMY, LUNG, ROBOT-ASSISTED, USING VATS
Anesthesia: General | Site: Chest | Laterality: Right

## 2019-12-18 MED ORDER — ALBUMIN HUMAN 5 % IV SOLN: INTRAVENOUS | Status: DC | PRN

## 2019-12-18 MED ORDER — SUGAMMADEX SODIUM 200 MG/2ML IV SOLN
INTRAVENOUS | Status: DC | PRN
  Administered 2019-12-18: 300 mg via INTRAVENOUS

## 2019-12-18 MED ORDER — PHENYLEPHRINE HCL-NACL 10-0.9 MG/250ML-% IV SOLN
INTRAVENOUS | Status: DC | PRN
  Administered 2019-12-18: 25 ug/min via INTRAVENOUS

## 2019-12-18 MED ORDER — LACTATED RINGERS IV SOLN: INTRAVENOUS | Status: DC | PRN

## 2019-12-18 MED ORDER — MELATONIN 3 MG PO TABS
9.0000 mg | ORAL_TABLET | Freq: Every day | ORAL | Status: DC
  Administered 2019-12-18 – 2019-12-21 (×4): 9 mg via ORAL
  Filled 2019-12-18 (×4): qty 3

## 2019-12-18 MED ORDER — PHENYLEPHRINE 40 MCG/ML (10ML) SYRINGE FOR IV PUSH (FOR BLOOD PRESSURE SUPPORT)
PREFILLED_SYRINGE | INTRAVENOUS | Status: AC
  Filled 2019-12-18: qty 10

## 2019-12-18 MED ORDER — ACETAMINOPHEN 500 MG PO TABS: 1000.0000 mg | ORAL_TABLET | Freq: Once | ORAL | Status: DC | PRN

## 2019-12-18 MED ORDER — BUPIVACAINE LIPOSOME 1.3 % IJ SUSP
20.0000 mL | Freq: Once | INTRAMUSCULAR | Status: DC
  Filled 2019-12-18: qty 20

## 2019-12-18 MED ORDER — DEXAMETHASONE SODIUM PHOSPHATE 10 MG/ML IJ SOLN
INTRAMUSCULAR | Status: DC | PRN
  Administered 2019-12-18: 8 mg via INTRAVENOUS

## 2019-12-18 MED ORDER — ACETAMINOPHEN 10 MG/ML IV SOLN: 1000.0000 mg | Freq: Once | INTRAVENOUS | Status: DC | PRN

## 2019-12-18 MED ORDER — PHENYLEPHRINE HCL-NACL 10-0.9 MG/250ML-% IV SOLN: INTRAVENOUS | Status: DC | PRN

## 2019-12-18 MED ORDER — SUCCINYLCHOLINE CHLORIDE 200 MG/10ML IV SOSY
PREFILLED_SYRINGE | INTRAVENOUS | Status: AC
  Filled 2019-12-18: qty 10

## 2019-12-18 MED ORDER — PANTOPRAZOLE SODIUM 40 MG PO TBEC
80.0000 mg | DELAYED_RELEASE_TABLET | Freq: Every day | ORAL | Status: DC
  Administered 2019-12-18 – 2019-12-22 (×5): 80 mg via ORAL
  Filled 2019-12-18 (×6): qty 2

## 2019-12-18 MED ORDER — INDOCYANINE GREEN 25 MG IV SOLR
INTRAVENOUS | Status: DC | PRN
  Administered 2019-12-18: 15 mg via INTRAVENOUS

## 2019-12-18 MED ORDER — VANCOMYCIN HCL IN DEXTROSE 1-5 GM/200ML-% IV SOLN
1000.0000 mg | INTRAVENOUS | Status: DC
  Filled 2019-12-18: qty 200

## 2019-12-18 MED ORDER — SODIUM CHLORIDE FLUSH 0.9 % IV SOLN
INTRAVENOUS | Status: DC | PRN
  Administered 2019-12-18: 100 mL

## 2019-12-18 MED ORDER — OXYCODONE HCL 5 MG/5ML PO SOLN: 5.0000 mg | Freq: Once | ORAL | Status: DC | PRN

## 2019-12-18 MED ORDER — ORAL CARE MOUTH RINSE
15.0000 mL | Freq: Two times a day (BID) | OROMUCOSAL | Status: DC
  Administered 2019-12-18 – 2019-12-21 (×6): 15 mL via OROMUCOSAL

## 2019-12-18 MED ORDER — DIPHENHYDRAMINE HCL 50 MG/ML IJ SOLN: 12.5000 mg | Freq: Four times a day (QID) | INTRAMUSCULAR | Status: DC | PRN

## 2019-12-18 MED ORDER — FLUTICASONE PROPIONATE 50 MCG/ACT NA SUSP
1.0000 | Freq: Two times a day (BID) | NASAL | Status: DC
  Administered 2019-12-18 – 2019-12-22 (×8): 1 via NASAL
  Filled 2019-12-18: qty 16

## 2019-12-18 MED ORDER — KETOROLAC TROMETHAMINE 15 MG/ML IJ SOLN
15.0000 mg | Freq: Four times a day (QID) | INTRAMUSCULAR | Status: DC | PRN
  Administered 2019-12-19 – 2019-12-20 (×2): 15 mg via INTRAVENOUS
  Filled 2019-12-18 (×2): qty 1

## 2019-12-18 MED ORDER — VANCOMYCIN HCL IN DEXTROSE 1-5 GM/200ML-% IV SOLN
1000.0000 mg | Freq: Two times a day (BID) | INTRAVENOUS | Status: AC
  Administered 2019-12-18: 1000 mg via INTRAVENOUS
  Filled 2019-12-18: qty 200

## 2019-12-18 MED ORDER — PROPOFOL 10 MG/ML IV BOLUS
INTRAVENOUS | Status: DC | PRN
  Administered 2019-12-18: 30 mg via INTRAVENOUS
  Administered 2019-12-18: 120 mg via INTRAVENOUS
  Administered 2019-12-18: 30 mg via INTRAVENOUS
  Administered 2019-12-18: 20 mg via INTRAVENOUS

## 2019-12-18 MED ORDER — FENTANYL CITRATE (PF) 250 MCG/5ML IJ SOLN
INTRAMUSCULAR | Status: DC | PRN
  Administered 2019-12-18: 100 ug via INTRAVENOUS
  Administered 2019-12-18 (×2): 50 ug via INTRAVENOUS
  Administered 2019-12-18: 100 ug via INTRAVENOUS
  Administered 2019-12-18 (×2): 50 ug via INTRAVENOUS

## 2019-12-18 MED ORDER — FENTANYL CITRATE (PF) 250 MCG/5ML IJ SOLN
INTRAMUSCULAR | Status: AC
  Filled 2019-12-18: qty 5

## 2019-12-18 MED ORDER — LIDOCAINE 2% (20 MG/ML) 5 ML SYRINGE
INTRAMUSCULAR | Status: AC
  Filled 2019-12-18: qty 5

## 2019-12-18 MED ORDER — LIDOCAINE HCL (CARDIAC) PF 100 MG/5ML IV SOSY
PREFILLED_SYRINGE | INTRAVENOUS | Status: DC | PRN
  Administered 2019-12-18: 60 mg via INTRATRACHEAL

## 2019-12-18 MED ORDER — PROPRANOLOL HCL ER 60 MG PO CP24
60.0000 mg | ORAL_CAPSULE | Freq: Every day | ORAL | Status: DC
  Administered 2019-12-19 – 2019-12-21 (×3): 60 mg via ORAL
  Filled 2019-12-18 (×4): qty 1

## 2019-12-18 MED ORDER — SODIUM CHLORIDE 0.9 % IV SOLN: INTRAVENOUS | Status: DC | PRN

## 2019-12-18 MED ORDER — MOMETASONE FURO-FORMOTEROL FUM 200-5 MCG/ACT IN AERO
2.0000 | INHALATION_SPRAY | Freq: Two times a day (BID) | RESPIRATORY_TRACT | Status: DC
  Administered 2019-12-18 – 2019-12-22 (×8): 2 via RESPIRATORY_TRACT
  Filled 2019-12-18: qty 8.8

## 2019-12-18 MED ORDER — DIPHENHYDRAMINE HCL 12.5 MG/5ML PO ELIX: 12.5000 mg | ORAL_SOLUTION | Freq: Four times a day (QID) | ORAL | Status: DC | PRN

## 2019-12-18 MED ORDER — FENTANYL CITRATE (PF) 100 MCG/2ML IJ SOLN: 25.0000 ug | INTRAMUSCULAR | Status: DC | PRN

## 2019-12-18 MED ORDER — ALBUTEROL SULFATE (2.5 MG/3ML) 0.083% IN NEBU
INHALATION_SOLUTION | RESPIRATORY_TRACT | Status: AC
  Filled 2019-12-18: qty 3

## 2019-12-18 MED ORDER — POLYETHYL GLYCOL-PROPYL GLYCOL 0.4-0.3 % OP GEL: Freq: Two times a day (BID) | OPHTHALMIC | Status: DC | PRN

## 2019-12-18 MED ORDER — NALOXONE HCL 0.4 MG/ML IJ SOLN: 0.4000 mg | INTRAMUSCULAR | Status: DC | PRN

## 2019-12-18 MED ORDER — ONDANSETRON HCL 4 MG/2ML IJ SOLN
INTRAMUSCULAR | Status: DC | PRN
  Administered 2019-12-18: 4 mg via INTRAVENOUS

## 2019-12-18 MED ORDER — BISACODYL 5 MG PO TBEC
10.0000 mg | DELAYED_RELEASE_TABLET | Freq: Every day | ORAL | Status: DC
  Administered 2019-12-19 – 2019-12-21 (×3): 10 mg via ORAL
  Filled 2019-12-18 (×4): qty 2

## 2019-12-18 MED ORDER — CEFAZOLIN SODIUM-DEXTROSE 2-3 GM-%(50ML) IV SOLR
INTRAVENOUS | Status: DC | PRN
  Administered 2019-12-18: 2 g via INTRAVENOUS

## 2019-12-18 MED ORDER — GLYCOPYRROLATE 0.2 MG/ML IJ SOLN
INTRAMUSCULAR | Status: DC | PRN
  Administered 2019-12-18 (×2): .1 mg via INTRAVENOUS

## 2019-12-18 MED ORDER — IPRATROPIUM-ALBUTEROL 0.5-2.5 (3) MG/3ML IN SOLN
3.0000 mL | Freq: Four times a day (QID) | RESPIRATORY_TRACT | Status: DC
  Administered 2019-12-18 – 2019-12-19 (×5): 3 mL via RESPIRATORY_TRACT
  Filled 2019-12-18 (×5): qty 3

## 2019-12-18 MED ORDER — 0.9 % SODIUM CHLORIDE (POUR BTL) OPTIME
TOPICAL | Status: DC | PRN
  Administered 2019-12-18: 1000 mL

## 2019-12-18 MED ORDER — ENOXAPARIN SODIUM 40 MG/0.4ML ~~LOC~~ SOLN
40.0000 mg | SUBCUTANEOUS | Status: DC
  Administered 2019-12-19 – 2019-12-22 (×4): 40 mg via SUBCUTANEOUS
  Filled 2019-12-18 (×4): qty 0.4

## 2019-12-18 MED ORDER — SENNOSIDES-DOCUSATE SODIUM 8.6-50 MG PO TABS
1.0000 | ORAL_TABLET | Freq: Every day | ORAL | Status: DC
  Administered 2019-12-18 – 2019-12-21 (×4): 1 via ORAL
  Filled 2019-12-18 (×4): qty 1

## 2019-12-18 MED ORDER — TRAMADOL HCL 50 MG PO TABS
50.0000 mg | ORAL_TABLET | Freq: Four times a day (QID) | ORAL | Status: DC | PRN
  Administered 2019-12-19: 14:00:00 50 mg via ORAL
  Administered 2019-12-20: 100 mg via ORAL
  Filled 2019-12-18 (×2): qty 2

## 2019-12-18 MED ORDER — GABAPENTIN 300 MG PO CAPS
300.0000 mg | ORAL_CAPSULE | Freq: Three times a day (TID) | ORAL | Status: DC
  Administered 2019-12-18 – 2019-12-22 (×12): 300 mg via ORAL
  Filled 2019-12-18 (×12): qty 1

## 2019-12-18 MED ORDER — INDOCYANINE GREEN 25 MG IV SOLR
INTRAVENOUS | Status: AC
  Filled 2019-12-18: qty 10

## 2019-12-18 MED ORDER — PROPOFOL 10 MG/ML IV BOLUS
INTRAVENOUS | Status: AC
  Filled 2019-12-18: qty 20

## 2019-12-18 MED ORDER — BUPIVACAINE HCL (PF) 0.5 % IJ SOLN
INTRAMUSCULAR | Status: AC
  Filled 2019-12-18: qty 30

## 2019-12-18 MED ORDER — FENTANYL 50 MCG/ML IV PCA SOLN
INTRAVENOUS | Status: DC
  Administered 2019-12-18: 30 ug via INTRAVENOUS
  Administered 2019-12-18 – 2019-12-19 (×3): 0 ug via INTRAVENOUS
  Administered 2019-12-19: 15 ug via INTRAVENOUS
  Administered 2019-12-19: 45 ug via INTRAVENOUS
  Administered 2019-12-20: 90 ug via INTRAVENOUS
  Administered 2019-12-20: 0 ug via INTRAVENOUS
  Administered 2019-12-21: 60 ug via INTRAVENOUS
  Administered 2019-12-21: 45 ug via INTRAVENOUS
  Filled 2019-12-18: qty 20

## 2019-12-18 MED ORDER — GUAIFENESIN ER 600 MG PO TB12: 600.0000 mg | ORAL_TABLET | Freq: Two times a day (BID) | ORAL | Status: DC | PRN

## 2019-12-18 MED ORDER — MELATONIN 3 MG PO TABS
10.0000 mg | ORAL_TABLET | Freq: Every day | ORAL | Status: DC
  Filled 2019-12-18 (×2): qty 3.5

## 2019-12-18 MED ORDER — ACETAMINOPHEN 500 MG PO TABS
1000.0000 mg | ORAL_TABLET | Freq: Four times a day (QID) | ORAL | Status: DC
  Administered 2019-12-18 – 2019-12-22 (×9): 1000 mg via ORAL
  Filled 2019-12-18 (×10): qty 2

## 2019-12-18 MED ORDER — ONDANSETRON HCL 4 MG/2ML IJ SOLN: 4.0000 mg | Freq: Four times a day (QID) | INTRAMUSCULAR | Status: DC | PRN

## 2019-12-18 MED ORDER — OXYCODONE HCL 5 MG PO TABS: 5.0000 mg | ORAL_TABLET | Freq: Once | ORAL | Status: DC | PRN

## 2019-12-18 MED ORDER — SODIUM CHLORIDE 0.9% FLUSH: 9.0000 mL | INTRAVENOUS | Status: DC | PRN

## 2019-12-18 MED ORDER — EPHEDRINE SULFATE 50 MG/ML IJ SOLN
INTRAMUSCULAR | Status: DC | PRN
  Administered 2019-12-18: 5 mg via INTRAVENOUS

## 2019-12-18 MED ORDER — ROCURONIUM BROMIDE 10 MG/ML (PF) SYRINGE
PREFILLED_SYRINGE | INTRAVENOUS | Status: AC
  Filled 2019-12-18: qty 10

## 2019-12-18 MED ORDER — MIDAZOLAM HCL 5 MG/5ML IJ SOLN
INTRAMUSCULAR | Status: DC | PRN
  Administered 2019-12-18 (×2): 1 mg via INTRAVENOUS

## 2019-12-18 MED ORDER — TRAZODONE HCL 50 MG PO TABS
25.0000 mg | ORAL_TABLET | Freq: Every evening | ORAL | Status: DC | PRN
  Administered 2019-12-19 – 2019-12-21 (×2): 50 mg via ORAL
  Filled 2019-12-18 (×3): qty 1

## 2019-12-18 MED ORDER — PAROXETINE HCL 20 MG PO TABS
20.0000 mg | ORAL_TABLET | Freq: Every day | ORAL | Status: DC
  Administered 2019-12-18 – 2019-12-22 (×5): 20 mg via ORAL
  Filled 2019-12-18 (×5): qty 1

## 2019-12-18 MED ORDER — ALBUTEROL SULFATE (2.5 MG/3ML) 0.083% IN NEBU: 3.0000 mL | INHALATION_SOLUTION | Freq: Four times a day (QID) | RESPIRATORY_TRACT | Status: DC | PRN

## 2019-12-18 MED ORDER — CHLORHEXIDINE GLUCONATE CLOTH 2 % EX PADS
6.0000 | MEDICATED_PAD | Freq: Every day | CUTANEOUS | Status: DC
  Administered 2019-12-19 – 2019-12-21 (×3): 6 via TOPICAL

## 2019-12-18 MED ORDER — ONDANSETRON HCL 4 MG/2ML IJ SOLN
INTRAMUSCULAR | Status: AC
  Filled 2019-12-18: qty 2

## 2019-12-18 MED ORDER — DEXTROSE-NACL 5-0.9 % IV SOLN: INTRAVENOUS | Status: DC

## 2019-12-18 MED ORDER — ROCURONIUM BROMIDE 100 MG/10ML IV SOLN
INTRAVENOUS | Status: DC | PRN
  Administered 2019-12-18 (×2): 20 mg via INTRAVENOUS
  Administered 2019-12-18: 80 mg via INTRAVENOUS

## 2019-12-18 MED ORDER — LUNG SURGERY BOOK
Freq: Once | Status: AC
  Filled 2019-12-18: qty 1

## 2019-12-18 MED ORDER — DEXAMETHASONE SODIUM PHOSPHATE 10 MG/ML IJ SOLN
INTRAMUSCULAR | Status: AC
  Filled 2019-12-18: qty 1

## 2019-12-18 MED ORDER — METOCLOPRAMIDE HCL 5 MG/ML IJ SOLN
10.0000 mg | Freq: Four times a day (QID) | INTRAMUSCULAR | Status: AC
  Administered 2019-12-18 – 2019-12-19 (×4): 10 mg via INTRAVENOUS
  Filled 2019-12-18 (×4): qty 2

## 2019-12-18 MED ORDER — ACETAMINOPHEN 160 MG/5ML PO SOLN: 1000.0000 mg | Freq: Once | ORAL | Status: DC | PRN

## 2019-12-18 MED ORDER — ACETAMINOPHEN 160 MG/5ML PO SOLN: 1000.0000 mg | Freq: Four times a day (QID) | ORAL | Status: DC

## 2019-12-18 MED ORDER — MIDAZOLAM HCL 2 MG/2ML IJ SOLN
INTRAMUSCULAR | Status: AC
  Filled 2019-12-18: qty 2

## 2019-12-18 MED ORDER — HEMOSTATIC AGENTS (NO CHARGE) OPTIME
TOPICAL | Status: DC | PRN
  Administered 2019-12-18: 3 via TOPICAL

## 2019-12-18 MED ORDER — ALBUTEROL SULFATE (2.5 MG/3ML) 0.083% IN NEBU
2.5000 mg | INHALATION_SOLUTION | Freq: Four times a day (QID) | RESPIRATORY_TRACT | Status: DC | PRN
  Administered 2019-12-18: 2.5 mg via RESPIRATORY_TRACT

## 2019-12-18 SURGICAL SUPPLY — 140 items
APPLIER CLIP ROT 10 11.4 M/L (STAPLE)
BLADE CLIPPER SURG (BLADE) ×2 IMPLANT
BLADE SURG SZ11 CARB STEEL (BLADE) ×2 IMPLANT
BNDG COHESIVE 4X5 TAN STRL (GAUZE/BANDAGES/DRESSINGS) ×3 IMPLANT
BNDG COHESIVE 6X5 TAN STRL LF (GAUZE/BANDAGES/DRESSINGS) ×3 IMPLANT
CANISTER SUCT 3000ML PPV (MISCELLANEOUS) ×6 IMPLANT
CANNULA REDUC XI 12-8 STAPL (CANNULA) ×6
CANNULA REDUCER 12-8 DVNC XI (CANNULA) ×4 IMPLANT
CATH THORACIC 28FR (CATHETERS) IMPLANT
CATH THORACIC 28FR RT ANG (CATHETERS) IMPLANT
CATH THORACIC 36FR (CATHETERS) IMPLANT
CATH THORACIC 36FR RT ANG (CATHETERS) IMPLANT
CLIP APPLIE ROT 10 11.4 M/L (STAPLE) IMPLANT
CLIP VESOCCLUDE MED 6/CT (CLIP) IMPLANT
CNTNR URN SCR LID CUP LEK RST (MISCELLANEOUS) IMPLANT
CONN ST 1/4X3/8  BEN (MISCELLANEOUS)
CONN ST 1/4X3/8 BEN (MISCELLANEOUS) IMPLANT
CONN Y 3/8X3/8X3/8  BEN (MISCELLANEOUS)
CONN Y 3/8X3/8X3/8 BEN (MISCELLANEOUS) IMPLANT
CONT SPEC 4OZ CLIKSEAL STRL BL (MISCELLANEOUS) ×10 IMPLANT
CONT SPEC 4OZ STRL OR WHT (MISCELLANEOUS) ×33
COVER SURGICAL LIGHT HANDLE (MISCELLANEOUS) IMPLANT
DEFOGGER SCOPE WARMER CLEARIFY (MISCELLANEOUS) ×3 IMPLANT
DERMABOND ADVANCED (GAUZE/BANDAGES/DRESSINGS) ×1
DERMABOND ADVANCED .7 DNX12 (GAUZE/BANDAGES/DRESSINGS) ×2 IMPLANT
DRAIN CHANNEL 28F RND 3/8 FF (WOUND CARE) IMPLANT
DRAIN CHANNEL 32F RND 10.7 FF (WOUND CARE) IMPLANT
DRAPE ARM DVNC X/XI (DISPOSABLE) ×8 IMPLANT
DRAPE COLUMN DVNC XI (DISPOSABLE) ×2 IMPLANT
DRAPE CV SPLIT W-CLR ANES SCRN (DRAPES) ×3 IMPLANT
DRAPE DA VINCI XI ARM (DISPOSABLE) ×12
DRAPE DA VINCI XI COLUMN (DISPOSABLE) ×3
DRAPE INCISE IOBAN 66X45 STRL (DRAPES) IMPLANT
DRAPE ORTHO SPLIT 77X108 STRL (DRAPES) ×3
DRAPE SURG ORHT 6 SPLT 77X108 (DRAPES) ×2 IMPLANT
DRAPE WARM FLUID 44X44 (DRAPES) ×1 IMPLANT
ELECT BLADE 6.5 EXT (BLADE) IMPLANT
ELECT REM PT RETURN 9FT ADLT (ELECTROSURGICAL) ×3
ELECTRODE REM PT RTRN 9FT ADLT (ELECTROSURGICAL) ×2 IMPLANT
GAUZE KITTNER 4X8 (MISCELLANEOUS) ×6 IMPLANT
GAUZE SPONGE 4X4 12PLY STRL (GAUZE/BANDAGES/DRESSINGS) ×3 IMPLANT
GAUZE SPONGE 4X4 12PLY STRL LF (GAUZE/BANDAGES/DRESSINGS) ×1 IMPLANT
GLOVE BIO SURGEON STRL SZ 6.5 (GLOVE) ×4 IMPLANT
GLOVE BIO SURGEON STRL SZ7.5 (GLOVE) ×4 IMPLANT
GLOVE BIOGEL PI IND STRL 6.5 (GLOVE) IMPLANT
GLOVE BIOGEL PI INDICATOR 6.5 (GLOVE) ×1
GLOVE ECLIPSE 8.0 STRL XLNG CF (GLOVE) ×1 IMPLANT
GLOVE SURG SIGNA 7.5 PF LTX (GLOVE) ×4 IMPLANT
GLOVE TRIUMPH SURG SIZE 7.5 (KITS) ×6 IMPLANT
GOWN STRL NON-REIN LRG LVL3 (GOWN DISPOSABLE) ×1 IMPLANT
GOWN STRL REUS W/ TWL LRG LVL3 (GOWN DISPOSABLE) ×4 IMPLANT
GOWN STRL REUS W/ TWL XL LVL3 (GOWN DISPOSABLE) ×8 IMPLANT
GOWN STRL REUS W/TWL LRG LVL3 (GOWN DISPOSABLE) ×6
GOWN STRL REUS W/TWL XL LVL3 (GOWN DISPOSABLE) ×12
HEMOSTAT SURGICEL 2X14 (HEMOSTASIS) ×9 IMPLANT
IRRIGATION STRYKERFLOW (MISCELLANEOUS) IMPLANT
IRRIGATOR STRYKERFLOW (MISCELLANEOUS)
KIT BASIN OR (CUSTOM PROCEDURE TRAY) ×3 IMPLANT
KIT SUCTION CATH 14FR (SUCTIONS) IMPLANT
KIT TURNOVER KIT B (KITS) ×3 IMPLANT
LOOP VESSEL SUPERMAXI WHITE (MISCELLANEOUS) IMPLANT
NDL HYPO 25GX1X1/2 BEV (NEEDLE) ×2 IMPLANT
NDL SPNL 22GX3.5 QUINCKE BK (NEEDLE) ×2 IMPLANT
NEEDLE HYPO 25GX1X1/2 BEV (NEEDLE) ×3 IMPLANT
NEEDLE SPNL 22GX3.5 QUINCKE BK (NEEDLE) ×3 IMPLANT
NS IRRIG 1000ML POUR BTL (IV SOLUTION) ×3 IMPLANT
PACK CHEST (CUSTOM PROCEDURE TRAY) ×3 IMPLANT
PAD ARMBOARD 7.5X6 YLW CONV (MISCELLANEOUS) ×6 IMPLANT
PATTIES SURGICAL 3 X3 (GAUZE/BANDAGES/DRESSINGS)
PATTIES SURGICAL 3X3 (GAUZE/BANDAGES/DRESSINGS) IMPLANT
POUCH ENDO CATCH II 15MM (MISCELLANEOUS) IMPLANT
POUCH RETRIEVAL ECOSAC 10 (ENDOMECHANICALS) ×2 IMPLANT
POUCH RETRIEVAL ECOSAC 10MM (ENDOMECHANICALS)
POUCH SPECIMEN RETRIEVAL 10MM (ENDOMECHANICALS) IMPLANT
RELOAD STAPLE 45 2.5 WHT DVNC (STAPLE) IMPLANT
RELOAD STAPLE 45 3.5 BLU DVNC (STAPLE) IMPLANT
RELOAD STAPLE 45 4.3 GRN DVNC (STAPLE) IMPLANT
RELOAD STAPLER 2.5X45 WHT DVNC (STAPLE) ×2 IMPLANT
RELOAD STAPLER 3.5X45 BLU DVNC (STAPLE) ×16 IMPLANT
RELOAD STAPLER 4.3X45 GRN DVNC (STAPLE) ×4 IMPLANT
SCISSORS LAP 5X35 DISP (ENDOMECHANICALS) IMPLANT
SEAL CANN UNIV 5-8 DVNC XI (MISCELLANEOUS) ×4 IMPLANT
SEAL XI 5MM-8MM UNIVERSAL (MISCELLANEOUS) ×6
SEALANT PROGEL (MISCELLANEOUS) IMPLANT
SEALANT SURG COSEAL 4ML (VASCULAR PRODUCTS) IMPLANT
SEALANT SURG COSEAL 8ML (VASCULAR PRODUCTS) IMPLANT
SET TUBE SMOKE EVAC HIGH FLOW (TUBING) ×3 IMPLANT
SHEARS HARMONIC HDI 20CM (ELECTROSURGICAL) IMPLANT
SHEET MEDIUM DRAPE 40X70 STRL (DRAPES) ×3 IMPLANT
SOL ANTI FOG 6CC (MISCELLANEOUS) IMPLANT
SOLUTION ANTI FOG 6CC (MISCELLANEOUS)
SOLUTION ELECTROLUBE (MISCELLANEOUS) ×1 IMPLANT
SPECIMEN JAR MEDIUM (MISCELLANEOUS) IMPLANT
SPONGE INTESTINAL PEANUT (DISPOSABLE) IMPLANT
SPONGE TONSIL TAPE 1 RFD (DISPOSABLE) IMPLANT
STAPLER 45 SUREFORM CVD (STAPLE) ×3
STAPLER 45 SUREFORM CVD DVNC (STAPLE) IMPLANT
STAPLER CANNULA SEAL DVNC XI (STAPLE) ×4 IMPLANT
STAPLER CANNULA SEAL XI (STAPLE) ×6
STAPLER RELOAD 2.5X45 WHITE (STAPLE) ×3
STAPLER RELOAD 2.5X45 WHT DVNC (STAPLE) ×2
STAPLER RELOAD 3.5X45 BLU DVNC (STAPLE) ×16
STAPLER RELOAD 3.5X45 BLUE (STAPLE) ×24
STAPLER RELOAD 4.3X45 GREEN (STAPLE) ×6
STAPLER RELOAD 4.3X45 GRN DVNC (STAPLE) ×4
STOPCOCK 4 WAY LG BORE MALE ST (IV SETS) ×3 IMPLANT
SUT PDS AB 3-0 SH 27 (SUTURE) IMPLANT
SUT PROLENE 4 0 RB 1 (SUTURE)
SUT PROLENE 4-0 RB1 .5 CRCL 36 (SUTURE) IMPLANT
SUT SILK  1 MH (SUTURE) ×6
SUT SILK 1 MH (SUTURE) ×4 IMPLANT
SUT SILK 1 TIES 10X30 (SUTURE) ×3 IMPLANT
SUT SILK 2 0 SH (SUTURE) IMPLANT
SUT SILK 2 0SH CR/8 30 (SUTURE) IMPLANT
SUT SILK 3 0 SH 30 (SUTURE) IMPLANT
SUT SILK 3 0SH CR/8 30 (SUTURE) IMPLANT
SUT VIC AB 1 CTX 36 (SUTURE)
SUT VIC AB 1 CTX36XBRD ANBCTR (SUTURE) IMPLANT
SUT VIC AB 2-0 CTX 36 (SUTURE) IMPLANT
SUT VIC AB 3-0 MH 27 (SUTURE) IMPLANT
SUT VIC AB 3-0 X1 27 (SUTURE) ×4 IMPLANT
SUT VICRYL 0 TIES 12 18 (SUTURE) ×3 IMPLANT
SUT VICRYL 0 UR6 27IN ABS (SUTURE) ×6 IMPLANT
SUT VICRYL 2 TP 1 (SUTURE) IMPLANT
SYR 10ML LL (SYRINGE) ×3 IMPLANT
SYR 20ML ECCENTRIC (SYRINGE) ×3 IMPLANT
SYR 30ML LL (SYRINGE) ×3 IMPLANT
SYR 50ML LL SCALE MARK (SYRINGE) ×3 IMPLANT
SYSTEM RETRIEVAL ANCHOR 12 (MISCELLANEOUS) ×1 IMPLANT
SYSTEM SAHARA CHEST DRAIN ATS (WOUND CARE) ×3 IMPLANT
TAPE CLOTH 4X10 WHT NS (GAUZE/BANDAGES/DRESSINGS) ×3 IMPLANT
TAPE CLOTH SURG 4X10 WHT LF (GAUZE/BANDAGES/DRESSINGS) ×1 IMPLANT
TIP APPLICATOR SPRAY EXTEND 16 (VASCULAR PRODUCTS) IMPLANT
TOWEL GREEN STERILE (TOWEL DISPOSABLE) ×3 IMPLANT
TOWEL GREEN STERILE FF (TOWEL DISPOSABLE) IMPLANT
TRAY FOLEY MTR SLVR 16FR STAT (SET/KITS/TRAYS/PACK) ×3 IMPLANT
TROCAR BLADELESS 15MM (ENDOMECHANICALS) IMPLANT
TROCAR XCEL 12X100 BLDLESS (ENDOMECHANICALS) ×3 IMPLANT
TUBING EXTENTION W/L.L. (IV SETS) ×3 IMPLANT
WATER STERILE IRR 1000ML POUR (IV SOLUTION) ×3 IMPLANT

## 2019-12-18 NOTE — Anesthesia Procedure Notes (Signed)
Arterial Line Insertion Start/End3/19/2021 7:00 AM, 12/11/2019 7:15 AM Performed by: Lavell Luster, CRNA  Patient location: Pre-op. Preanesthetic checklist: patient identified, IV checked, risks and benefits discussed, surgical consent, pre-op evaluation and timeout performed Lidocaine 1% used for infiltration and patient sedated Right, radial was placed Catheter size: 20 G Hand hygiene performed , maximum sterile barriers used  and Seldinger technique used Allen's test indicative of satisfactory collateral circulation Attempts: 2 Procedure performed without using ultrasound guided technique. Following insertion, Biopatch and dressing applied. Post procedure assessment: normal  Patient tolerated the procedure well with no immediate complications. Additional procedure comments: Attempted x 1 on left pulse difficult to palpate.  Switched to right side with no complication.  Henderson Cloud, CRNA.

## 2019-12-18 NOTE — Brief Op Note (Addendum)
12/18/2019  11:01 AM  PATIENT:  [name]  64 y.o. female  PRE-OPERATIVE DIAGNOSIS:  RLL LUNG NODULE  POST-OPERATIVE DIAGNOSIS:  NON-SMALL CELL CARCINOMA RIGHT LOWER LOBE, CLINICAL STAGE IA (T1N0)  PROCEDURE:  Procedure(s):  XI ROBOTIC ASSISTED THORASCOPY- RIGHT LOWER LOBE SUPERIOR SEGMENTECTOMY (Right) Lymph Node Dissection Intercostal Nerve Block (Right)  SURGEON:  Surgeon(s) and Role: * Melrose Nakayama, MD - Primary Lanelle Bal MD  PHYSICIAN ASSISTANT: Ellwood Handler PA-C  ANESTHESIA:   general  EBL: Minimal  BLOOD ADMINISTERED:none  DRAINS: [address]   LOCAL MEDICATIONS USED:  BUPIVICAINE   SPECIMEN:  Source of Specimen:  Right Lower Lobe Superior Segment  DISPOSITION OF SPECIMEN:  PATHOLOGY  COUNTS:  YES  TOURNIQUET:  * No tourniquets in log *  DICTATION: .Dragon Dictation  PLAN OF CARE: Admit to inpatient   PATIENT DISPOSITION:  PACU - hemodynamically stable.   Delay start of Pharmacological VTE agent (>24hrs) due to surgical blood loss or risk of bleeding: no

## 2019-12-18 NOTE — Transfer of Care (Signed)
Immediate Anesthesia Transfer of Care Note  Patient: [name]  Procedure(s) Performed: XI ROBOTIC ASSISTED THORASCOPY-RIGHT LOWER LOBE SEGMENTECTOMY (Right Chest) Node Dissection Intercostal Nerve Block (Right Chest)  Patient Location: PACU  Anesthesia Type:General  Level of Consciousness: awake, alert  and sedated  Airway & Oxygen Therapy: Patient connected to face mask oxygen  Post-op Assessment: Post -op Vital signs reviewed and stable  Post vital signs: stable  Last Vitals:  Vitals Value Taken Time  BP 150/122 12/18/19 1116  Temp    Pulse 79 12/18/19 1118  Resp 21 12/18/19 1118  SpO2 100 % 12/18/19 1118  Vitals shown include unvalidated device data.  Last Pain:  Vitals:   12/18/19 0604  PainSc: 0-No pain      Patients Stated Pain Goal: 3 ([id number])  Complications: No apparent anesthesia complications

## 2019-12-18 NOTE — Anesthesia Procedure Notes (Addendum)
Procedure Name: Intubation Date/Time: 12/18/2019 7:45 AM Performed by: Lavell Luster, CRNA Pre-anesthesia Checklist: Patient identified, Emergency Drugs available, Suction available, Patient being monitored and Timeout performed Patient Re-evaluated:Patient Re-evaluated prior to induction Oxygen Delivery Method: Circle system utilized Preoxygenation: Pre-oxygenation with 100% oxygen Induction Type: IV induction Ventilation: Mask ventilation without difficulty Laryngoscope Size: Miller and 2 Grade View: Grade II Tube type: Oral Endobronchial tube: Double lumen EBT and Left and 35 Fr Number of attempts: 2 Airway Equipment and Method: Stylet and Fiberoptic brochoscope Placement Confirmation: ETT inserted through vocal cords under direct vision,  positive ETCO2 and breath sounds checked- equal and bilateral Tube secured with: Tape Dental Injury: Teeth and Oropharynx as per pre-operative assessment  Difficulty Due To: Difficulty was anticipated Comments: Atraumatic induction and intubation with 35 L DLT, attempted x 1 by CRNA with grade II view cricoid pressure applied ETT would not pass through cords.  Attempted x 1 by Dr Ermalene Postin with Sabra Heck 2 successful without stylet, placement verified by Ermalene Postin.  Henderson Cloud, CRNA

## 2019-12-18 NOTE — Discharge Instructions (Signed)
Video-Assisted Thoracic Surgery, Care After This sheet gives you information about how to care for yourself after your procedure. Your health care provider may also give you more specific instructions. If you have problems or questions, contact your health care provider. What can I expect after the procedure? After the procedure, it is common to have:  Some pain and soreness in your chest.  Pain when breathing in (inhaling) and coughing.  Constipation.  Fatigue.  Difficulty sleeping. Follow these instructions at home: Preventing pneumonia  Take deep breaths or do breathing exercises as instructed by your health care provider. Doing this helps prevent lung infection (pneumonia).  Cough frequently. Coughing may cause discomfort, but it is important to clear mucus (phlegm) and expand your lungs. If it hurts to cough, hold a pillow against your chest or place the palms of both hands on top of the incision (use splinting) when you cough. This may help relieve discomfort.  If you were given an incentive spirometer, use it as directed. An incentive spirometer is a tool that measures how well you are filling your lungs with each breath.  Participate in pulmonary rehabilitation as directed by your health care provider. This is a program that combines education, exercise, and support from a team of specialists. The goal is to help you heal and get back to your normal activities as soon as possible. Medicines  Take over-the-counter or prescription medicines only as told by your health care provider.  If you have pain, take pain-relieving medicine before your pain becomes severe. This is important because if your pain is under control, you will be able to breathe and cough more comfortably.  If you were prescribed an antibiotic medicine, take it as told by your health care provider. Do not stop taking the antibiotic even if you start to feel better. Activity  Ask your health care provider what  activities are safe for you.  Avoid activities that use your chest muscles for at least 3-4 weeks.  Do not lift anything that is heavier than 10 lb (4.5 kg), or the limit that your health care provider tells you, until he or she says that it is safe. Incision care  Follow instructions from your health care provider about how to take care of your incision(s). Make sure you: ? Wash your hands with soap and water before you change your bandage (dressing). If soap and water are not available, use hand sanitizer. ? Change your dressing as told by your health care provider. ? Leave stitches (sutures), skin glue, or adhesive strips in place. These skin closures may need to stay in place for 2 weeks or longer. If adhesive strip edges start to loosen and curl up, you may trim the loose edges. Do not remove adhesive strips completely unless your health care provider tells you to do that.  Keep your dressing dry until it has been removed.  Check your incision area every day for signs of infection. Check for: ? Redness, swelling, or pain. ? Fluid or blood. ? Warmth. ? Pus or a bad smell. Bathing  Do not take baths, swim, or use a hot tub until your health care provider approves. You may take showers.  After your dressing has been removed, use soap and water to gently wash your incision area. Do not use anything else to clean your incision(s) unless your health care provider tells you to do this. Driving   Do not drive until your health care provider approves.  Do not drive or  use heavy machinery while taking prescription pain medicine. Eating and drinking  Eat a healthy, balanced diet as instructed by your health care provider. A healthy diet includes plenty of fresh fruits and vegetables, whole grains, and low-fat (lean) proteins.  Limit foods that are high in fat and processed sugars, such as fried and sweet foods.  Drink enough fluid to keep your urine clear or pale yellow. General  instructions   To prevent or treat constipation while you are taking prescription pain medicine, your health care provider may recommend that you: ? Take over-the-counter or prescription medicines. ? Eat foods that are high in fiber, such as beans, fresh fruits and vegetables, and whole grains.  Do not use any products that contain nicotine or tobacco, such as cigarettes and e-cigarettes. If you need help quitting, ask your health care provider.  Avoid secondhand smoke.  Wear compression stockings as told by your health care provider. These stockings help to prevent blood clots and reduce swelling in your legs.  If you have a chest tube, care for it as instructed by your health care provider. Do not travel by airplane during the 2 weeks after your chest tube is removed, or until your health care provider says that this is safe.  Keep all follow-up visits as told by your health care provider. This is important. Contact a health care provider if:  You have redness, swelling, or pain around an incision.  You have fluid or blood coming from an incision.  Your incision area feels warm to the touch.  You have pus or a bad smell coming from an incision.  You have a fever or chills.  You have nausea or vomiting.  You have pain that does not get better with medicine. Get help right away if:  You have chest pain.  Your heart is fluttering or beating rapidly.  You develop a rash.  You have shortness of breath or trouble breathing.  You are confused.  You have trouble speaking.  You feel weak, light-headed, or dizzy.  You faint. Summary  To help prevent lung infection (pneumonia), take deep breaths or do breathing exercises as instructed by your health care provider.  Cough frequently to clear mucus (phlegm) and expand your lungs. If it hurts to cough, hold a pillow against your chest or place the palms of both hands on top of the incision (use splinting) when you cough.  If  you have pain, take pain-relieving medicine before your pain becomes severe. This is important because if your pain is under control, you will be able to breathe and cough more comfortably.  Ask your health care provider what activities are safe for you. This information is not intended to replace advice given to you by your health care provider. Make sure you discuss any questions you have with your health care provider. Document Revised: 08/30/2017 Document Reviewed: 08/27/2016 Elsevier Patient Education  2020 [employer].

## 2019-12-18 NOTE — Discharge Summary (Addendum)
Physician Discharge Summary  Patient ID: Draven Laine MRN: [id number] DOB/AGE: 65-Sep-1957 65 y.o.  Admit date: 12/18/2019 Discharge date: 12/22/2019  Admission Diagnoses:  Right lower lobe lung nodule Patient Active Problem List   Diagnosis Date Noted  . Adenocarcinoma of left lung, stage 1 ([place]) 08/25/2019  . Lung mass 03/13/2018  . Chronic bronchitis ([place]) 02/04/2018  . Left upper lobe pulmonary nodule 02/04/2018  . Vaccine counseling 11/13/2017  . Cirrhosis ([place]) 11/13/2017  . 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 Hepatitis C, chronic ([place]) 03/14/2015  . Tobacco use disorder 01/21/2014  . Tremor 10/14/2012  . Migraine headache 08/01/2010  . DEPRESSION 03/19/2008  . GERD 03/19/2008  . NEPHROLITHIASIS, HX OF 03/19/2008   Discharge Diagnoses:   Adenocarcinoma right lower lobe- Clinical and Pathologic stage IA(T1N0) Patient Active Problem List   Diagnosis Date Noted  . 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  . Adenocarcinoma of left lung, stage 1 ([place]) 08/25/2019  . Lung mass 03/13/2018  . Chronic bronchitis ([place]) 02/04/2018  . Left upper lobe pulmonary nodule 02/04/2018  . Vaccine counseling 11/13/2017  . Cirrhosis ([place]) 11/13/2017  . 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 Hepatitis C, chronic ([place]) 03/14/2015  . Tobacco use disorder 01/21/2014  . Tremor 10/14/2012  . Migraine headache 08/01/2010  . DEPRESSION 03/19/2008  . GERD 03/19/2008  . NEPHROLITHIASIS, HX OF 03/19/2008   Discharged Condition: good  History of Present Illness:   Ms. Fusilier is a 64 yo female with a history of tobacco abuse, COPD, stage IB (T2N0) adenocarcinoma, chronic bronchitis, and hepatitis C.  The patient had a lingular segmentectomy for a stage IB adenocarcinoma in June 2019.  She has been followed since that time.  Her biggest issue post surgery was post operative neuropathic pain which responded to gabapentin.  She is a current smoker of a 1/2 ppd.  She denied chest pain, but does experience  exertional shortness of breath.  Recent follow up CT scan showed an increase in a nodule in the RLL.  There was no evidence of adenopathy.  A PET CT scan showed the nodule to be mildly hypermetabolic.  It was recommended the patient undergo a right lower superior segmentectomy to remove the nodule.  The risks and benefits of the procedure were explained to the patient and she was agreeable to proceed.  Hospital Course:   Ms. Ritacco presented to [facility] hospital on 12/18/2019.  She was taken to the operating room and underwent Robotic Assisted Super Segmentectomy, lymph node dissection, and intercostal nerve block.  She tolerated the procedure without difficulty, was extubated and taken to the PACU in stable condition. The patient did well post operatively.  She had a small air leak, that resolved.  Her chest tube was transitioned to water seal on 12/20/2019.  Her CXR was stable and free of significant pneumothorax.  Air leak resolved and her chest tube was removed without difficulty on 12/21/2019.  Follow up CXR again remained stable with trace apical space.  Her pain is well controlled on Neurontin.  She is ambulating independently.  She has been weaned off her oxygen.  She is medically stable for discharge home today.   Significant Diagnostic Studies: nuclear medicine:   1. The bilobed right lower lobe pulmonary nodule has a maximum SUV of 1.0. Given the slow growth this is considerably likely to represent a low-grade adenocarcinoma despite the low metabolic activity. 2.  Aortic Atherosclerosis (ICD10-I70.0).  Coronary atherosclerosis. 3. Chronic right maxillary sinusitis.  Treatments: surgery:  Excised robotic-assisted right thoracoscopy, right lower lobe superior segmentectomy, lymph node dissection, intercostal nerve blocks at levels 3 through 10.  Discharge Exam: Blood pressure (!) 99/53, pulse 63, temperature (!) 97.4 F (36.3 C), temperature source Oral, resp. rate 19, height 5\' 5"   (1.651 m), weight 52.6 kg, SpO2 94 %.   General appearance: alert, cooperative and no distress Heart: regular rate and rhythm Lungs: clear to auscultation bilaterally Abdomen: soft, non-tender; bowel sounds normal; no masses,  no organomegaly Extremities: extremities normal, atraumatic, no cyanosis or edema Wound: clean and dry   Discharge medications:   Allergies as of 12/22/2019      Reactions   Aspirin Shortness Of Breath   Morphine Sulfate Other (See Comments)   RED STREAKS ON ARMS.   Oxycodone Itching, Rash   Glycopyrrolate    UNSPECIFIED REACTION  pt unsure if she is allergic.   Lactose Intolerance (gi) Nausea And Vomiting   Milk only.Patient states that she can eat cheese.   Penicillins Other (See Comments)    Heartburn, upset stomach only & tolerated AMOXIL after this reaction Has patient had a PCN reaction causing immediate rash, facial/tongue/throat swelling, SOB or lightheadedness with hypotension: No Has patient had a PCN reaction causing severe rash involving mucus membranes or skin necrosis: No Has patient had a PCN reaction that required hospitalization: No Has patient had a PCN reaction occurring within the last 10 years: #  #  #  YES  #  #  #    Sulfa Antibiotics Tinitus      Medication List    TAKE these medications   acetaminophen 500 MG tablet Commonly known as: TYLENOL Take 2 tablets (1,000 mg total) by mouth every 6 (six) hours as needed.   albuterol 108 (90 Base) MCG/ACT inhaler Commonly known as: VENTOLIN HFA Inhale 2 puffs into the lungs every 6 (six) hours as needed for shortness of breath.   budesonide-formoterol 160-4.5 MCG/ACT inhaler Commonly known as: SYMBICORT Inhale 2 puffs into the lungs 2 (two) times daily.   fluticasone 50 MCG/ACT nasal spray Commonly known as: Flonase Place 1 spray into both nostrils 2 (two) times daily.   gabapentin 300 MG capsule Commonly known as: NEURONTIN Take 1 capsule (300 mg total) by mouth 3 (three)  times daily. What changed: See the new instructions.   guaiFENesin 600 MG 12 hr tablet Commonly known as: MUCINEX Take 1 tablet (600 mg total) by mouth 2 (two) times daily as needed for cough or to loosen phlegm.   ibuprofen 200 MG tablet Commonly known as: ADVIL Take 400-600 mg by mouth every 6 (six) hours as needed for mild pain or moderate pain.   Melatonin 10 MG Tabs Take 10 mg by mouth at bedtime.   omeprazole 40 MG capsule Commonly known as: PRILOSEC Take 40 mg by mouth daily.   PARoxetine 20 MG tablet Commonly known as: PAXIL Take 1 tablet (20 mg total) by mouth daily.   propranolol ER 60 MG 24 hr capsule Commonly known as: INDERAL LA Take 1 capsule by mouth once daily   SYSTANE OP Place 1 drop into both eyes 2 (two) times daily as needed (dry eyes).   traMADol 50 MG tablet Commonly known as: ULTRAM Take 1-2 tablets (50-100 mg total) by mouth every 6 (six) hours as needed (mild pain).   traZODone 50 MG tablet Commonly known as: DESYREL Take 0.5-1 tablets (25-50 mg total) by mouth at bedtime as needed for sleep.      Follow-up Information  Melrose Nakayama, MD Follow up on 01/05/2020.   Specialty: Cardiothoracic Surgery Why: Appointment is at 1:45, please get CXR at 1:15 at [facility] located on first floor of our office building Contact information: [address] [place] [address] [phone]           Signed: Ellwood Handler, PA-C 12/22/2019, 9:47 AM

## 2019-12-18 NOTE — Interval H&P Note (Signed)
History and Physical Interval Note:  12/18/2019 7:27 AM  [name]  has presented today for surgery, with the diagnosis of RLL LUNG NODULES.  The various methods of treatment have been discussed with the patient and family. After consideration of risks, benefits and other options for treatment, the patient has consented to  Procedure(s): XI ROBOTIC ASSISTED THORASCOPY-RIGHT LOWER LOBE SEGMENTECTOMY (Right) as a surgical intervention.  The patient's history has been reviewed, patient examined, no change in status, stable for surgery.  I have reviewed the patient's chart and labs.  Questions were answered to the patient's satisfaction.     Melrose Nakayama

## 2019-12-19 ENCOUNTER — Inpatient Hospital Stay (HOSPITAL_COMMUNITY): Payer: BC Managed Care – PPO

## 2019-12-19 LAB — BASIC METABOLIC PANEL
Anion gap: 10 (ref 5–15)
BUN: 7 mg/dL — ABNORMAL LOW (ref 8–23)
CO2: 22 mmol/L (ref 22–32)
Calcium: 8.8 mg/dL — ABNORMAL LOW (ref 8.9–10.3)
Chloride: 105 mmol/L (ref 98–111)
Creatinine, Ser: 0.76 mg/dL (ref 0.44–1.00)
GFR calc Af Amer: >60 mL/min (ref >60)
GFR calc non Af Amer: >60 mL/min (ref >60)
Glucose, Bld: 152 mg/dL — ABNORMAL HIGH (ref 70–99)
Potassium: 4 mmol/L (ref 3.5–5.1)
Sodium: 137 mmol/L (ref 135–145)

## 2019-12-19 LAB — CBC
HCT: 38.4 % (ref 36.0–46.0)
Hemoglobin: 12.7 g/dL (ref 12.0–15.0)
MCH: 30.2 pg (ref 26.0–34.0)
MCHC: 33.1 g/dL (ref 30.0–36.0)
MCV: 91.2 fL (ref 80.0–100.0)
Platelets: 162 10*3/uL (ref 150–400)
RBC: 4.21 MIL/uL (ref 3.87–5.11)
RDW: 12.9 % (ref 11.5–15.5)
WBC: 9.2 10*3/uL (ref 4.0–10.5)
nRBC: 0 % (ref 0.0–0.2)

## 2019-12-19 LAB — BLOOD GAS, ARTERIAL
Acid-base deficit: 0.3 mmol/L (ref 0.0–2.0)
Bicarbonate: 24 mmol/L (ref 20.0–28.0)
FIO2: 21
O2 Saturation: 95.6 %
Patient temperature: 36.9
pCO2 arterial: 40.7 mmHg (ref 32.0–48.0)
pH, Arterial: 7.389 (ref 7.350–7.450)
pO2, Arterial: 75.8 mmHg — ABNORMAL LOW (ref 83.0–108.0)

## 2019-12-19 MED ORDER — IPRATROPIUM-ALBUTEROL 0.5-2.5 (3) MG/3ML IN SOLN
3.0000 mL | Freq: Three times a day (TID) | RESPIRATORY_TRACT | Status: DC
  Administered 2019-12-20 – 2019-12-22 (×7): 3 mL via RESPIRATORY_TRACT
  Filled 2019-12-19 (×7): qty 3

## 2019-12-19 NOTE — Op Note (Signed)
NAME: [name], HOELZER MEDICAL RECORD [id number] ACCOUNT [id number] DATE OF BIRTH:02-05-1956 FACILITY: [facility] LOCATION: [facility] PHYSICIAN:Zamira Hickam Chaya Jan, MD  OPERATIVE REPORT  DATE OF PROCEDURE:  12/18/2019  PREOPERATIVE DIAGNOSIS:  Right lower lobe lung nodule.  POSTOPERATIVE DIAGNOSIS:  Nonsmall-cell carcinoma, right lower lobe, clinical stage IA (T1, N0).  PROCEDURE:   Xi robotic-assisted right thoracoscopy, Right lower lobe superior segmentectomy, Lymph node dissection, Intercostal nerve blocks levels 3 through 10.  SURGEON:  Modesto Charon, MD  ASSISTANT:  Lanelle Bal, MD  SECOND ASSISTANT:  Ellwood Handler, PA  ANESTHESIA:  General.  FINDINGS:  Nodule in superior segment.  Frozen section revealed nonsmall-cell carcinoma.  Bronchial margin negative for tumor.  CLINICAL NOTE:  [name] is a 64 year old woman with a history of tobacco abuse and COPD.  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  She has been followed since then.  She recently had been found to have an  increase in the size of a small nodule in the superior segment of the right lower lobe.  This was highly suspicious for a second primary nonsmall-cell carcinoma.  She was offered the option of biopsy followed by radiation versus surgical  resection with a segmentectomy.  The indications, risks, benefits, and alternatives were discussed in detail with the patient.  She opted to proceed with surgical resection.  OPERATIVE NOTE:  [name] was brought to the preoperative holding area on 12/18/2019.  Anesthesia service placed a central venous catheter and an arterial blood pressure monitoring line.  She was taken to the operating room, anesthetized and intubated  with a double lumen endotracheal tube.  Intravenous antibiotics were administered.  A Foley catheter was placed.  Sequential compression devices were placed on the calves for DVT prophylaxis.  She was  placed in a left lateral decubitus position, and the  right chest was prepped and draped in the usual sterile fashion.  Single lung ventilation of the left lung was initiated and was tolerated well throughout the procedure.  A Bair Hugger was in place for active warming.  A timeout was performed.  A solution containing 20 mL of liposomal bupivacaine and 30 mL of 0.5% bupivacaine and 50 mL of saline was prepared.  This was used for local injection of the incision sites prior to making incision and for the intercostal nerve  blocks.  A timeout was performed.  An incision was made in the 9th interspace in the mid axillary line.  An 8 mm robotic port was placed and the thoracoscope was advanced into the chest.  There was good isolation of the right lung, although it was  relatively slow to deflate initially.  Carbon dioxide was insufflated per protocol.  Three additional robotic ports were placed.  One was placed 5 cm anterior to the first port.  This was a 12 mm port.  A second 12 mm port was placed 5 cm posterior to  the scope port, and then finally an 8 mm port was placed 5 cm posterior to that.  Centered between the second and third ports, a 12 mm assistant port was placed in the 10th intercostal space.  Nerve blocks were performed by injecting 10 mL of the bupivacaine  solution into a subpleural plane at each interspace from the 3rd to the 10th.  Inspection of the lung showed adhesions at the apex, which were not taken down, and this precluded access to the paratracheal lymph nodes.  There were no suspicious nodes in  that area on imaging.  The lower lobe was retracted superiorly, and the inferior  pulmonary ligament was divided.  All lymph nodes that were encountered during the dissection were removed and sent as separate specimens for permanent pathology.  The pleural reflection was divided posteriorly up to the level of the azygos vein.  During  the dissection of the subcarinal nodes, some bleeding was  encountered.  Surgicel and pressure were used to stop the bleeding, and then ultimately the bronchial artery branch was identified and cauterized.  The bifurcation of the upper lobe bronchus and  bronchus intermedius was identified and a level 11 lymph node was dissected out in that area.  Attention then was turned to the fissure.  The fissure was incomplete, but anteriorly between the middle lobe and the lower lobe there was a relatively thin layer of tissue overlying the basilar segmental pulmonary artery branch.  Dissection was begun in  this area with bipolar cautery.  Once a plane was developed, the fissure was completed working anterior to posterior with sequential firings of the robotic stapler with blue cartridges.  After having done this, the superior segmental arterial branch was  well dissected laterally.  The bifurcation between the basilar segmental branch and the superior segmental branch was dissected out and a node from that area was removed.  The artery then was easily encircled.  The vascular stapler was placed and the superior segmental artery was divided.  Next, the superior segmental bronchus was identified.  It was dissected out.  The robotic stapler with a green cartridge was placed across the superior segmental bronchus and closed.  A test inflation was performed, and there was  good aeration of the basilar portion of the lower lobe.  The stapler was fired, transecting the bronchus.  Intravenous indocyanine green then was injected, and the Firefly was used to identify the margin between the superior segment and the basilar  segments.  The segmentectomy then was completed with sequential firings of the robotic stapler working anterior to posterior using a combination of blue and green cartridges and making sure to have the entire superior segment in the specimen.  The  superior segment then was placed into an endoscopic retrieval bag and brought into the lower portion of the chest.   Inspection was made for hemostasis.  The node dissection was limited due to adhesions of the upper lobe superiorly.  The chest was  copiously irrigated with warm saline.  A test inflation to 30 cm of water revealed no leakage from the bronchus or the parenchyma.  The robotic instruments were removed, and the robot was undocked.  The skin incision at the assistant's port was enlarged  slightly, and the specimen was removed from that port.  All sponges had been removed prior to undocking the robot.  A 28-French Blake drain was placed via the original port incision and directed to the apex.  It was secured to skin with a 0 silk.   Dual-lung ventilation was resumed.  The remaining incisions were closed in standard fashion.  Dermabond was applied.  The chest tube was placed to suction.  The patient was placed back in supine position.  She then was extubated in the operating room and  taken to the [facility] in good condition.  Frozen section returned showing nonsmall-cell carcinoma of the bronchial margin was free of tumor.  All sponge, needle and instrument counts were correct at the end of the procedure.  LN/NUANCE  D:12/18/2019 T:12/19/2019 JOB:[id number]

## 2019-12-19 NOTE — Progress Notes (Signed)
Pt ambulated in a hallway once and ambulating in a room and to the bathroom throughout the day, sat in a recliner pretty much all day. Pt is using PCA very actively, has a lots of pain on incisional area. Dressing changed, site looks CDI, chest tube drainage output is moderate and recorded, chest tube in suction -20cm, besides that pt does not have any specific complain, encouraged to use spirometry, Foley and ART line taken off this am, will continue to monitor the patient  Palma Holter, RN

## 2019-12-19 NOTE — Progress Notes (Addendum)
      [address]       [place] [address]             [phone]       1 Day Post-Op Procedure(s) (LRB): XI ROBOTIC ASSISTED THORASCOPY-RIGHT LOWER LOBE SEGMENTECTOMY (Right) Node Dissection Intercostal Nerve Block (Right)  Subjective: Patient sitting in chair and has eaten breakfast. She has some incisional pain.  Objective: Vital signs in last 24 hours: Temp:  [96.8 F (36 C)-98.9 F (37.2 C)] 98 F (36.7 C) (03/20 0800) Pulse Rate:  [57-74] 59 (03/20 0800) Cardiac Rhythm: Sinus bradycardia (03/20 0700) Resp:  [13-23] 19 (03/20 0800) BP: (86-150)/(41-122) 125/63 (03/20 0800) SpO2:  [94 %-100 %] 100 % (03/20 0851) Arterial Line BP: (50-183)/(40-84) 50/45 (03/20 0800) FiO2 (%):  [98 %] 98 % (03/19 2300)      Intake/Output from previous day: 03/19 0701 - 03/20 0700 In: 3398.8 [P.O.:240; I.V.:2608.8; IV Piggyback:550] Out: 7096 [Urine:1805; Blood:100; Chest Tube:540]   Physical Exam:  Cardiovascular: RRR Pulmonary: Slightly coarse on right and clear on the left Abdomen: Soft, non tender, bowel sounds present. Extremities: Nol lower extremity edema. Wounds: Clean and dry.  No erythema or signs of infection. Chest Tube:to suction, continuous +1 air leak  Lab Results: CBC: Recent Labs    12/19/19 0440  WBC 9.2  HGB 12.7  HCT 38.4  PLT 162   BMET:  Recent Labs    12/19/19 0440  NA 137  K 4.0  CL 105  CO2 22  GLUCOSE 152*  BUN 7*  CREATININE 0.76  CALCIUM 8.8*    PT/INR: No results for input(s): LABPROT, INR in the last 72 hours. ABG:  INR: Will add last result for INR, ABG once components are confirmed Will add last 4 CBG results once components are confirmed  Assessment/Plan:  1. CV - SB at times with HR in the 50's. On Propanolol ER 60 mg daily (for tremors). 2.  Pulmonary - ABG results noted (low PO2 75.8).On 1 liter of oxygen via [place]. Chest tube with 570 cc of output. Chest tube is to suction and there is a + 1 fairly  continuous air leak. CXR this am appears stable. Continue Dulera. Check CXR in am. Await final pathology. 3. Remove a line and foley 4. Decrease IVF  Donielle M ZimmermanPA-C 12/19/2019,9:48 AM [phone]  Agree with above Small air leak Needs aggressive pulm toilet Needs ambulatin  Melizza Kanode O Arlon Bleier

## 2019-12-20 ENCOUNTER — Inpatient Hospital Stay (HOSPITAL_COMMUNITY): Payer: BC Managed Care – PPO

## 2019-12-20 LAB — COMPREHENSIVE METABOLIC PANEL
ALT: 42 U/L (ref 0–44)
AST: 34 U/L (ref 15–41)
Albumin: 3 g/dL — ABNORMAL LOW (ref 3.5–5.0)
Alkaline Phosphatase: 61 U/L (ref 38–126)
Anion gap: 7 (ref 5–15)
BUN: 8 mg/dL (ref 8–23)
CO2: 25 mmol/L (ref 22–32)
Calcium: 8.9 mg/dL (ref 8.9–10.3)
Chloride: 106 mmol/L (ref 98–111)
Creatinine, Ser: 0.74 mg/dL (ref 0.44–1.00)
GFR calc Af Amer: >60 mL/min (ref >60)
GFR calc non Af Amer: >60 mL/min (ref >60)
Glucose, Bld: 126 mg/dL — ABNORMAL HIGH (ref 70–99)
Potassium: 3.7 mmol/L (ref 3.5–5.1)
Sodium: 138 mmol/L (ref 135–145)
Total Bilirubin: 0.9 mg/dL (ref 0.3–1.2)
Total Protein: 5.4 g/dL — ABNORMAL LOW (ref 6.5–8.1)

## 2019-12-20 LAB — CBC
HCT: 34.9 % — ABNORMAL LOW (ref 36.0–46.0)
Hemoglobin: 11.8 g/dL — ABNORMAL LOW (ref 12.0–15.0)
MCH: 30.3 pg (ref 26.0–34.0)
MCHC: 33.8 g/dL (ref 30.0–36.0)
MCV: 89.7 fL (ref 80.0–100.0)
Platelets: 163 10*3/uL (ref 150–400)
RBC: 3.89 MIL/uL (ref 3.87–5.11)
RDW: 12.9 % (ref 11.5–15.5)
WBC: 7.2 10*3/uL (ref 4.0–10.5)
nRBC: 0 % (ref 0.0–0.2)

## 2019-12-20 MED ORDER — POTASSIUM CHLORIDE CRYS ER 20 MEQ PO TBCR
20.0000 meq | EXTENDED_RELEASE_TABLET | ORAL | Status: AC
  Administered 2019-12-20 (×3): 20 meq via ORAL
  Filled 2019-12-20 (×3): qty 1

## 2019-12-20 MED ORDER — POLYETHYLENE GLYCOL 3350 17 G PO PACK
17.0000 g | PACK | Freq: Every day | ORAL | Status: DC
  Administered 2019-12-20 – 2019-12-21 (×2): 17 g via ORAL
  Filled 2019-12-20 (×3): qty 1

## 2019-12-20 NOTE — Progress Notes (Addendum)
      [address]       [place] [address]             [phone]       2 Days Post-Op Procedure(s) (LRB): XI ROBOTIC ASSISTED THORASCOPY-RIGHT LOWER LOBE SEGMENTECTOMY (Right) Node Dissection Intercostal Nerve Block (Right)  Subjective: Patient eating fruit this am. She has some incisional pain.  Objective: Vital signs in last 24 hours: Temp:  [97.7 F (36.5 C)-98.8 F (37.1 C)] 98.2 F (36.8 C) (03/21 0732) Pulse Rate:  [58-68] 62 (03/21 0805) Cardiac Rhythm: Normal sinus rhythm (03/21 0700) Resp:  [16-23] 20 (03/21 0805) BP: (91-141)/(38-71) 108/44 (03/21 0805) SpO2:  [96 %-100 %] 98 % (03/21 0805) FiO2 (%):  [95 %] 95 % (03/21 0726)     Intake/Output from previous day: 03/20 0701 - 03/21 0700 In: 1076.3 [P.O.:960; I.V.:116.3] Out: 1855 [Urine:1575; Chest Tube:280]   Physical Exam:  Cardiovascular: RRR Pulmonary: Slightly diminished right base and left lung is clear Abdomen: Soft, non tender, bowel sounds present. Extremities: Nol lower extremity edema. Wounds: Clean and dry.  No erythema or signs of infection. Chest Tube:to suction, continuous +1 air leak  Lab Results: CBC: Recent Labs    12/19/19 0440 12/20/19 0218  WBC 9.2 7.2  HGB 12.7 11.8*  HCT 38.4 34.9*  PLT 162 163   BMET:  Recent Labs    12/19/19 0440 12/20/19 0218  NA 137 138  K 4.0 3.7  CL 105 106  CO2 22 25  GLUCOSE 152* 126*  BUN 7* 8  CREATININE 0.76 0.74  CALCIUM 8.8* 8.9    PT/INR: No results for input(s): LABPROT, INR in the last 72 hours. ABG:  INR: Will add last result for INR, ABG once components are confirmed Will add last 4 CBG results once components are confirmed  Assessment/Plan:  1. CV - SB at times with HR in the 50's. On Propanolol ER 60 mg daily (for tremors). 2.  Pulmonary - On 1 liter of oxygen via [place]. Chest tube with 300 cc of output. Chest tube is to suction and there is a + 1 fairly continuous air leak. CXR this am appears stable  (trace right apical pneumothorax). Place to water seal as discussed with Dr. Kipp Brood. Continue Dulera. Check CXR in am. Await final pathology. 3. Expected post op blood loss anemia-H and H 11.8 and 34.9 this am  Donielle M ZimmermanPA-C 12/20/2019,10:44 AM [phone]  Agree with above Will transition to WS Aggressive pulm toilet  Divya Munshi O Adylene Dlugosz

## 2019-12-20 NOTE — Plan of Care (Signed)

## 2019-12-21 ENCOUNTER — Inpatient Hospital Stay (HOSPITAL_COMMUNITY): Payer: BC Managed Care – PPO

## 2019-12-21 MED ORDER — AMIODARONE IV BOLUS ONLY 150 MG/100ML: 150.0000 mg | Freq: Once | INTRAVENOUS | Status: DC

## 2019-12-21 NOTE — Anesthesia Postprocedure Evaluation (Signed)
Anesthesia Post Note  Patient: [name]  Procedure(s) Performed: XI ROBOTIC ASSISTED THORASCOPY-RIGHT LOWER LOBE SEGMENTECTOMY (Right Chest) Node Dissection Intercostal Nerve Block (Right Chest)     Patient location during evaluation: PACU Anesthesia Type: General Level of consciousness: awake and alert Pain management: pain level controlled Vital Signs Assessment: post-procedure vital signs reviewed and stable Respiratory status: spontaneous breathing, nonlabored ventilation, respiratory function stable and patient connected to nasal cannula oxygen Cardiovascular status: blood pressure returned to baseline and stable Postop Assessment: no apparent nausea or vomiting Anesthetic complications: no    Last Vitals:  Vitals:   12/21/19 1331 12/21/19 1800  BP:  (!) 88/50  Pulse: (!) 57   Resp: 17 20  Temp:  36.6 C  SpO2:  99%    Last Pain:  Vitals:   12/21/19 1800  TempSrc: Oral  PainSc:    Pain Goal: Patients Stated Pain Goal: 2 (12/20/19 1746)                 Romuald Mccaslin

## 2019-12-21 NOTE — Progress Notes (Addendum)
      [address]       [place] [address]             [phone]      3 Days Post-Op Procedure(s) (LRB): XI ROBOTIC ASSISTED THORASCOPY-RIGHT LOWER LOBE SEGMENTECTOMY (Right) Node Dissection Intercostal Nerve Block (Right)   Subjective:  Patient with pain, however not as bad as her first surgery.  She states her pain is well controlled.   + productive cough  Objective: Vital signs in last 24 hours: Temp:  [97.4 F (36.3 C)-98.2 F (36.8 C)] 98 F (36.7 C) (03/22 0342) Pulse Rate:  [61-74] 64 (03/22 0726) Cardiac Rhythm: Normal sinus rhythm (03/22 0700) Resp:  [19-24] 19 (03/22 0726) BP: (91-123)/(44-84) 92/46 (03/22 0342) SpO2:  [94 %-98 %] 97 % (03/22 0726) FiO2 (%):  [96 %] 96 % (03/21 1537)  Intake/Output from previous day: 03/21 0701 - 03/22 0700 In: 1643.1 [P.O.:960; I.V.:683.1] Out: 1550 [Urine:1450; Chest Tube:100]  General appearance: alert, cooperative and no distress Heart: regular rate and rhythm Lungs: coarse clears with cough Abdomen: soft, non-tender; bowel sounds normal; no masses,  no organomegaly Extremities: extremities normal, atraumatic, no cyanosis or edema Wound: clean and dry  Lab Results: Recent Labs    12/19/19 0440 12/20/19 0218  WBC 9.2 7.2  HGB 12.7 11.8*  HCT 38.4 34.9*  PLT 162 163   BMET:  Recent Labs    12/19/19 0440 12/20/19 0218  NA 137 138  K 4.0 3.7  CL 105 106  CO2 22 25  GLUCOSE 152* 126*  BUN 7* 8  CREATININE 0.76 0.74  CALCIUM 8.8* 8.9    PT/INR: No results for input(s): LABPROT, INR in the last 72 hours. ABG    Component Value Date/Time   PHART 7.389 12/19/2019 0412   HCO3 24.0 12/19/2019 0412   ACIDBASEDEF 0.3 12/19/2019 0412   O2SAT 95.6 12/19/2019 0412   CBG (last 3)  No results for input(s): GLUCAP in the last 72 hours.  Assessment/Plan: S/P Procedure(s) (LRB): XI ROBOTIC ASSISTED THORASCOPY-RIGHT LOWER LOBE SEGMENTECTOMY (Right) Node Dissection Intercostal Nerve Block  (Right)  1. CV- Sinus Brady, labile BP- takes Propanolol ER for h/o tremors 2. Pulm- wean oxygen as able, CT on water seal.. no air leak... CXR with stable appearance pneumothorax.. will d/c chest tube.... continue home inhalers 3. Lovenox for DVT prophylaxis 4. Pain control- well controlled, continue Neurontin, PCA, oral medications 5. dispo- patient stable, no air leak, will d/c chest tube today, possibly for d/c in AM  If patient is doing well, and pain is controlled   LOS: 3 days    Ellwood Handler, PA-C  12/21/2019  Patient seen and examined, agree with above  Remo Lipps C. Roxan Hockey, MD [facility] [phone]

## 2019-12-21 NOTE — Plan of Care (Signed)

## 2019-12-21 NOTE — Plan of Care (Signed)

## 2019-12-22 ENCOUNTER — Inpatient Hospital Stay (HOSPITAL_COMMUNITY): Payer: BC Managed Care – PPO

## 2019-12-22 LAB — SURGICAL PATHOLOGY

## 2019-12-22 MED ORDER — ACETAMINOPHEN 500 MG PO TABS: 1000.0000 mg | ORAL_TABLET | Freq: Four times a day (QID) | ORAL | 0 refills | Status: DC | PRN

## 2019-12-22 MED ORDER — GABAPENTIN 300 MG PO CAPS: 300.0000 mg | ORAL_CAPSULE | Freq: Three times a day (TID) | ORAL | 1 refills | Status: DC

## 2019-12-22 MED ORDER — TRAMADOL HCL 50 MG PO TABS: 50.0000 mg | ORAL_TABLET | Freq: Four times a day (QID) | ORAL | 0 refills | Status: DC | PRN

## 2019-12-22 NOTE — Progress Notes (Addendum)
      [address]       [place] [address]             [phone]      4 Days Post-Op Procedure(s) (LRB): XI ROBOTIC ASSISTED THORASCOPY-RIGHT LOWER LOBE SEGMENTECTOMY (Right) Node Dissection Intercostal Nerve Block (Right)   Subjective:  No new complaints.  Doing well, off oxygen.  Ready to go home  Objective: Vital signs in last 24 hours: Temp:  [97.6 F (36.4 C)-98.9 F (37.2 C)] 98 F (36.7 C) (03/23 0342) Pulse Rate:  [56-63] 61 (03/23 0711) Cardiac Rhythm: Normal sinus rhythm (03/23 0700) Resp:  [16-21] 19 (03/23 0711) BP: (88-112)/(46-69) 112/54 (03/23 0520) SpO2:  [94 %-99 %] 94 % (03/23 0711)  Intake/Output from previous day: 03/22 0701 - 03/23 0700 In: 240 [P.O.:240] Out: 1500 [Urine:1500]  General appearance: alert, cooperative and no distress Heart: regular rate and rhythm Lungs: clear to auscultation bilaterally Abdomen: soft, non-tender; bowel sounds normal; no masses,  no organomegaly Extremities: extremities normal, atraumatic, no cyanosis or edema Wound: clean and dry  Lab Results: Recent Labs    12/20/19 0218  WBC 7.2  HGB 11.8*  HCT 34.9*  PLT 163   BMET:  Recent Labs    12/20/19 0218  NA 138  K 3.7  CL 106  CO2 25  GLUCOSE 126*  BUN 8  CREATININE 0.74  CALCIUM 8.9    PT/INR: No results for input(s): LABPROT, INR in the last 72 hours. ABG    Component Value Date/Time   PHART 7.389 12/19/2019 0412   HCO3 24.0 12/19/2019 0412   ACIDBASEDEF 0.3 12/19/2019 0412   O2SAT 95.6 12/19/2019 0412   CBG (last 3)  No results for input(s): GLUCAP in the last 72 hours.  Assessment/Plan: S/P Procedure(s) (LRB): XI ROBOTIC ASSISTED THORASCOPY-RIGHT LOWER LOBE SEGMENTECTOMY (Right) Node Dissection Intercostal Nerve Block (Right)  1. CV- SB, BP low- due to Propanolol for tremors 2. Pulm- CXR with trace apical space, off oxygen, no acute issues 3. Pain control- doing well with Tylenol, Neurontin 4. Dispo- patient  stable, will d/c home today   LOS: 4 days    Ellwood Handler, PA-C  12/22/2019  Patient seen and examined, agree with above DC home today  Cave. Roxan Hockey, MD [facility] [phone]

## 2019-12-22 NOTE — Progress Notes (Signed)
Pt discharged home, taken to front of hospital via w/c to meet family member.

## 2019-12-22 NOTE — Plan of Care (Signed)

## 2019-12-22 NOTE — Progress Notes (Signed)
MD made aware of pts diagnostic results. Will continue to monitor.

## 2019-12-24 ENCOUNTER — Other Ambulatory Visit: Payer: Self-pay | Admitting: *Deleted

## 2019-12-24 NOTE — Progress Notes (Signed)
The proposed treatment discussed in cancer conference 12/24/19 is for discussion purpose only and is not a binding recommendation.  The patient was not physically examined nor present for their treatment options.  Therefore, final treatment plans cannot be decided.

## 2019-12-30 ENCOUNTER — Telehealth: Payer: Self-pay | Admitting: Family Medicine

## 2019-12-30 ENCOUNTER — Telehealth (INDEPENDENT_AMBULATORY_CARE_PROVIDER_SITE_OTHER): Payer: BC Managed Care – PPO | Admitting: Family Medicine

## 2019-12-30 ENCOUNTER — Other Ambulatory Visit: Payer: Self-pay | Admitting: Family Medicine

## 2019-12-30 DIAGNOSIS — R0989 Other specified symptoms and signs involving the circulatory and respiratory systems: Secondary | ICD-10-CM

## 2019-12-30 DIAGNOSIS — J989 Respiratory disorder, unspecified: Secondary | ICD-10-CM

## 2019-12-30 DIAGNOSIS — F418 Other specified anxiety disorders: Secondary | ICD-10-CM

## 2019-12-30 MED ORDER — ALPRAZOLAM 0.25 MG PO TABS: 0.2500 mg | ORAL_TABLET | Freq: Three times a day (TID) | ORAL | 0 refills | Status: DC | PRN

## 2019-12-30 NOTE — Telephone Encounter (Signed)
Offer virtual appointment so we can discuss options

## 2019-12-30 NOTE — Telephone Encounter (Signed)
Pt states that she is recovering from Lung cancer surgery and her husband went to the ER three/four days ago for a stroke/high fever. She is having a hard time coping with all this stress and is wondering if her PCP will call her in something for her anxiety short term?  Pharmacy: [place]: [phone]   Pt can be reached at [phone]

## 2019-12-30 NOTE — Telephone Encounter (Signed)
Spoke with the pt and scheduled a virtual visit for today at 3:45pm.

## 2019-12-30 NOTE — Progress Notes (Signed)
This visit type was conducted due to national recommendations for restrictions regarding the COVID-19 pandemic in an effort to limit this patient's exposure and mitigate transmission in our community.   Virtual Visit via Video Note  I connected with [name] on 12/30/19 at  3:45 PM EDT by a video enabled telemedicine application and verified that I am speaking with the correct person using two identifiers.  Location patient: home Location provider:work or home office Persons participating in the virtual visit: patient, provider  I discussed the limitations of evaluation and management by telemedicine and the availability of in person appointments. The patient expressed understanding and agreed to proceed.   HPI: Ms. Previti had called earlier today requesting something to help short-term with overwhelming stress and anxiety symptoms.  She relates that she is recovering from lung cancer surgery.  She had surgery on 19 March.  Her husband is currently admitted at [facility] with recent CVA and has had high fever and they are trying to figure out the origin of that.  She has felt very overwhelmed with stress and anxiety symptoms.  She already takes Paxil and is on trazodone for sleep at night.  She is sleeping fairly well with trazodone and melatonin combination.  She is on tramadol 2 tablets in the morning and 2 at night.  No alcohol use.  She does have supportive family but hesitates to ask them for help.  She has had some counseling through her pastor.   ROS: See pertinent positives and negatives per HPI.  Past Medical History:  Diagnosis Date  . Anxiety   . 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 Bronchitis   . Depression   . Dyspnea   . Emphysema, unspecified ([place])    per patient mild case  . Family history of adverse reaction to anesthesia    patient states sister has a hard time waking up from anesthesia.   . Hepatitis C   . lung ca dx'd 03/2018  . Tremor, unspecified    non-specific; takes inderal.     Past Surgical History:  Procedure Laterality Date  . ABDOMINAL HYSTERECTOMY  1986   menorrhagia; hx of cryo for abnormal paps  . APPENDECTOMY     removed in 1986 per pt  . INTERCOSTAL NERVE BLOCK Right 12/18/2019   Procedure: Intercostal Nerve Block;  Surgeon: Melrose Nakayama, MD;  Location: [place];  Service: Thoracic;  Laterality: Right;  . LOBECTOMY Left 03/13/2018   Procedure: Lingula section of left upper lobe lobectomy;  Surgeon: Melrose Nakayama, MD;  Location: [place];  Service: Thoracic;  Laterality: Left;  . NODE DISSECTION  12/18/2019   Procedure: Node Dissection;  Surgeon: Melrose Nakayama, MD;  Location: [place];  Service: Thoracic;;  . VIDEO ASSISTED THORACOSCOPY (VATS)/WEDGE RESECTION Left 03/13/2018   Procedure: VIDEO ASSISTED THORACOSCOPY (VATS)/WEDGE RESECTION;  Surgeon: Melrose Nakayama, MD;  Location: [facility];  Service: Thoracic;  Laterality: Left;    Family History  Problem Relation Age of Onset  . Asthma Mother   . Alzheimer's disease Mother 15  . Hypertension Mother   . Other Father        shot   . Neuropathy Sister        face  . Cancer Maternal Aunt        breast  . Cancer Paternal Aunt   . Cancer Paternal Grandmother        abdominal  . Lung cancer Paternal Uncle     SOCIAL HX: No  alcohol.  Former smoker.   Current Outpatient Medications:  .  acetaminophen (TYLENOL) 500 MG tablet, Take 2 tablets (1,000 mg total) by mouth every 6 (six) hours as needed., Disp: 30 tablet, Rfl: 0 .  albuterol (PROVENTIL HFA;VENTOLIN HFA) 108 (90 Base) MCG/ACT inhaler, Inhale 2 puffs into the lungs every 6 (six) hours as needed for shortness of breath., Disp: 18 g, Rfl: 4 .  ALPRAZolam (XANAX) 0.25 MG tablet, Take 1 tablet (0.25 mg total) by mouth 3 (three) times daily as needed for anxiety., Disp: 20 tablet, Rfl: 0 .  budesonide-formoterol (SYMBICORT) 160-4.5 MCG/ACT inhaler, Inhale 2 puffs into the lungs 2 (two) times daily.,  Disp: 1 Inhaler, Rfl: 5 .  fluticasone (FLONASE) 50 MCG/ACT nasal spray, Place 1 spray into both nostrils 2 (two) times daily., Disp: 16 g, Rfl: 5 .  gabapentin (NEURONTIN) 300 MG capsule, Take 1 capsule (300 mg total) by mouth 3 (three) times daily., Disp: 90 capsule, Rfl: 1 .  guaiFENesin (MUCINEX) 600 MG 12 hr tablet, Take 1 tablet (600 mg total) by mouth 2 (two) times daily as needed for cough or to loosen phlegm. (Patient not taking: Reported on 12/11/2019), Disp: , Rfl:  .  ibuprofen (ADVIL,MOTRIN) 200 MG tablet, Take 400-600 mg by mouth every 6 (six) hours as needed for mild pain or moderate pain. , Disp: , Rfl:  .  Melatonin 10 MG TABS, Take 10 mg by mouth at bedtime., Disp: , Rfl:  .  omeprazole (PRILOSEC) 40 MG capsule, Take 40 mg by mouth daily., Disp: , Rfl:  .  PARoxetine (PAXIL) 20 MG tablet, Take 1 tablet (20 mg total) by mouth daily., Disp: 90 tablet, Rfl: 1 .  Polyethyl Glycol-Propyl Glycol (SYSTANE OP), Place 1 drop into both eyes 2 (two) times daily as needed (dry eyes)., Disp: , Rfl:  .  propranolol ER (INDERAL LA) 60 MG 24 hr capsule, Take 1 capsule by mouth once daily (Patient taking differently: Take 60 mg by mouth daily. ), Disp: 30 capsule, Rfl: 3 .  traMADol (ULTRAM) 50 MG tablet, Take 1-2 tablets (50-100 mg total) by mouth every 6 (six) hours as needed (mild pain)., Disp: 30 tablet, Rfl: 0 .  traZODone (DESYREL) 50 MG tablet, Take 0.5-1 tablets (25-50 mg total) by mouth at bedtime as needed for sleep., Disp: 30 tablet, Rfl: 3  EXAM:  VITALS per patient if applicable:  GENERAL: alert, oriented, appears well and in no acute distress  HEENT: atraumatic, conjunttiva clear, no obvious abnormalities on inspection of external nose and ears  NECK: normal movements of the head and neck  LUNGS: on inspection no signs of respiratory distress, breathing rate appears normal, no obvious gross SOB, gasping or wheezing  CV: no obvious cyanosis  MS: moves all visible extremities  without noticeable abnormality  PSYCH/NEURO: pleasant and cooperative, no obvious depression or anxiety, speech and thought processing grossly intact  ASSESSMENT AND PLAN:  Discussed the following assessment and plan:  Severe situational anxiety   multiple situational stressors as above.  -We discussed nonpharmacologic ways of trying to handle her stress and anxiety.  We strongly advocated counseling which she is currently getting from her pastor.  -She has taken alprazolam in the past and we discussed concerns with benzodiazepine especially with long-term use.  We recommended against that.  We also discussed risk such as increased risk of fall.  We did agree to very limited alprazolam 0.25 mg 1 every 8 hours for severe anxiety but she knows this is not  a long-term option.  She is already on Paxil daily. -Follow-up with primary when she is back to discuss more chronic management of her anxiety symptoms are persisting.     I discussed the assessment and treatment plan with the patient. The patient was provided an opportunity to ask questions and all were answered. The patient agreed with the plan and demonstrated an understanding of the instructions.   The patient was advised to call back or seek an in-person evaluation if the symptoms worsen or if the condition fails to improve as anticipated.     Carolann Littler, MD

## 2020-01-04 ENCOUNTER — Other Ambulatory Visit: Payer: Self-pay | Admitting: Thoracic Surgery (Cardiothoracic Vascular Surgery)

## 2020-01-04 DIAGNOSIS — C3492 Malignant neoplasm of unspecified part of left bronchus or lung: Secondary | ICD-10-CM

## 2020-01-05 ENCOUNTER — Other Ambulatory Visit: Payer: Self-pay

## 2020-01-05 ENCOUNTER — Ambulatory Visit (INDEPENDENT_AMBULATORY_CARE_PROVIDER_SITE_OTHER): Payer: Self-pay | Admitting: Thoracic Surgery (Cardiothoracic Vascular Surgery)

## 2020-01-05 ENCOUNTER — Ambulatory Visit
Admission: RE | Admit: 2020-01-05 | Discharge: 2020-01-05 | Disposition: A | Discharge status: Home / Self Care | Payer: BC Managed Care – PPO | Source: Ambulatory Visit | Attending: Thoracic Surgery (Cardiothoracic Vascular Surgery) | Admitting: Thoracic Surgery (Cardiothoracic Vascular Surgery)

## 2020-01-05 VITALS — BP 150/72 | HR 62 | Temp 96.8°F | Resp 20 | Ht 65.0 in | Wt 121.0 lb

## 2020-01-05 DIAGNOSIS — C3492 Malignant neoplasm of unspecified part of left bronchus or lung: Secondary | ICD-10-CM

## 2020-01-05 DIAGNOSIS — Z09 Encounter for follow-up examination after completed treatment for conditions other than malignant neoplasm: Secondary | ICD-10-CM

## 2020-01-05 MED ORDER — GABAPENTIN 300 MG PO CAPS: 300.0000 mg | ORAL_CAPSULE | Freq: Three times a day (TID) | ORAL | 1 refills | Status: DC

## 2020-01-05 NOTE — Progress Notes (Signed)
[address]       [place] [address]             [phone]     HPI: [name] returns for scheduled follow-up visit  [name] is a 64 year old woman with a past history of tobacco abuse, COPD, stage Ib (T2, N0,) adenocarcinoma status post lingular segmentectomy, chronic bronchitis, and hepatitis C.  She had a lingular segmentectomy in June 2019.  She had neuropathic pain postoperatively.  She has been followed since then.  She recently developed a new right lower lobe nodule and on follow-up the nodule had increased in size.  PET/CT showed some uptake in the nodule.  I did a robotic right lower lobe superior segmentectomy on 12/18/2019.  She did well postoperatively and was discharged on day 4.  She had a lot less pain this time around as we started her on gabapentin prior to surgery.  She continues to do well.  She still taking gabapentin and ibuprofen.  She is not using any narcotics.  Unfortunately her husband is hospitalized currently with fevers.  He is undergoing work-up.  She has been very upset about that.  Past Medical History:  Diagnosis Date  . Anxiety   . 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 Bronchitis   . Depression   . Dyspnea   . Emphysema, unspecified ([place])    per patient mild case  . Family history of adverse reaction to anesthesia    patient states sister has a hard time waking up from anesthesia.   . Hepatitis C   . lung ca dx'd 03/2018  . Tremor, unspecified    non-specific; takes inderal.     Current Outpatient Medications  Medication Sig Dispense Refill  . albuterol (PROVENTIL HFA;VENTOLIN HFA) 108 (90 Base) MCG/ACT inhaler Inhale 2 puffs into the lungs every 6 (six) hours as needed for shortness of breath. 18 g 4  . fluticasone (FLONASE) 50 MCG/ACT nasal spray Place 1 spray into both nostrils 2 (two) times daily. 16 g 5  . gabapentin (NEURONTIN) 300 MG capsule Take 1 capsule (300 mg total) by mouth 3 (three) times daily. 90 capsule  1  . guaiFENesin (MUCINEX) 600 MG 12 hr tablet Take 1 tablet (600 mg total) by mouth 2 (two) times daily as needed for cough or to loosen phlegm.    Marland Kitchen ibuprofen (ADVIL,MOTRIN) 200 MG tablet Take 400-600 mg by mouth every 6 (six) hours as needed for mild pain or moderate pain.     . Melatonin 10 MG TABS Take 10 mg by mouth at bedtime.    Marland Kitchen omeprazole (PRILOSEC) 40 MG capsule Take 20 mg by mouth daily.     Marland Kitchen PARoxetine (PAXIL) 20 MG tablet Take 1 tablet (20 mg total) by mouth daily. 90 tablet 1  . Polyethyl Glycol-Propyl Glycol (SYSTANE OP) Place 1 drop into both eyes 2 (two) times daily as needed (dry eyes).    . propranolol ER (INDERAL LA) 60 MG 24 hr capsule Take 1 capsule by mouth once daily (Patient taking differently: Take 60 mg by mouth daily. ) 30 capsule 3  . SYMBICORT 160-4.5 MCG/ACT inhaler Inhale 2 puffs by mouth twice daily 11 g 0  . traZODone (DESYREL) 50 MG tablet Take 0.5-1 tablets (25-50 mg total) by mouth at bedtime as needed for sleep. 30 tablet 3   No current facility-administered medications for this visit.    Physical Exam BP (!) 150/72   Pulse 62  Temp (!) 96.8 F (36 C) (Skin)   Resp 20   Ht 5\' 5"  (1.651 m)   Wt 121 lb (54.9 kg)   SpO2 95% Comment: RA  BMI 20.47 kg/m  64 year old woman in no acute distress Alert and oriented x3 with no focal deficits Lungs diminished bilaterally but equal, no wheezing Cardiac regular rate and rhythm Incisions well-healed  Diagnostic Tests: CHEST - 2 VIEW  COMPARISON:  Radiographs 12/22/2019 and 12/21/2019. PET-CT 12/03/2019.  FINDINGS: The heart size and mediastinal contours are stable. There are postsurgical changes in the perihilar regions bilaterally. There is interval overall improved aeration of the lungs. Small bilateral pleural effusions persist. No pneumothorax or focal osseous abnormality identified.  IMPRESSION: Interval improved aeration of the lungs with persistent small bilateral pleural  effusions.   Electronically Signed   By: Richardean Sale M.D.   On: 01/05/2020 13:23  I personally reviewed the chest x-ray images and concur with the findings noted above  Impression: [name] is a 64 year old woman with a past history of tobacco abuse, COPD, chronic bronchitis, and hepatitis C.  She has a history of lung cancer with a lingular segmentectomy for a stage Ib adenocarcinoma in 2019.  She then developed a new right lower lobe lung nodule.  She had a right lower lobe superior segmentectomy on 12/18/2019.  She is now about 3 weeks out from surgery.  She is using gabapentin and ibuprofen for pain.  She is not using any narcotics.  She has done well from a respiratory standpoint.  Stage Ia (T1, N0) adenocarcinoma superior segment right lower lobe-previous stage Ib adenocarcinoma on the left.  She will follow up with Dr. Julien Nordmann.  There is no indication for adjuvant therapy at this time.  She may drive.  Appropriate precautions were discussed.  She should gradually build into other activities but there were no restrictions on her activities at this time.  Plan: Follow-up as scheduled with Dr. Julien Nordmann in July I will plan to see her back in August to check on her progress She knows she can call anytime if she has any issues prior to that.  Melrose Nakayama, MD [facility] [phone]

## 2020-01-08 ENCOUNTER — Other Ambulatory Visit: Payer: Self-pay | Admitting: Family Medicine

## 2020-01-08 DIAGNOSIS — G4700 Insomnia, unspecified: Secondary | ICD-10-CM

## 2020-01-12 ENCOUNTER — Other Ambulatory Visit: Payer: Self-pay | Admitting: Family Medicine

## 2020-01-22 ENCOUNTER — Other Ambulatory Visit: Payer: Self-pay

## 2020-01-25 ENCOUNTER — Telehealth: Payer: Self-pay | Admitting: Family Medicine

## 2020-01-25 ENCOUNTER — Ambulatory Visit: Payer: BC Managed Care – PPO | Admitting: Family Medicine

## 2020-01-25 NOTE — Telephone Encounter (Signed)
Spoke with Fritz Pickerel and he stated the pt did not pick up the Rx from 4/13.  He stated the Rx is ready for pick up today and they will contact the pt.

## 2020-01-25 NOTE — Telephone Encounter (Signed)
Pt has been to her pharmacy since 4/13 and they did not give her the Inderal. Pt is wondering if it can be resent  Medication Refill: Inderal Pharmacy: [place]: [phone]

## 2020-02-16 ENCOUNTER — Other Ambulatory Visit: Payer: Self-pay

## 2020-02-17 ENCOUNTER — Ambulatory Visit (INDEPENDENT_AMBULATORY_CARE_PROVIDER_SITE_OTHER): Payer: BC Managed Care – PPO | Admitting: Family Medicine

## 2020-02-17 ENCOUNTER — Encounter: Payer: Self-pay | Admitting: Family Medicine

## 2020-02-17 VITALS — BP 120/80 | HR 50 | Temp 97.9°F | Ht 65.0 in | Wt 123.7 lb

## 2020-02-17 DIAGNOSIS — J410 Simple chronic bronchitis: Secondary | ICD-10-CM | POA: Diagnosis not present

## 2020-02-17 DIAGNOSIS — C3492 Malignant neoplasm of unspecified part of left bronchus or lung: Secondary | ICD-10-CM | POA: Diagnosis not present

## 2020-02-17 DIAGNOSIS — R251 Tremor, unspecified: Secondary | ICD-10-CM | POA: Diagnosis not present

## 2020-02-17 DIAGNOSIS — R519 Headache, unspecified: Secondary | ICD-10-CM

## 2020-02-17 DIAGNOSIS — F321 Major depressive disorder, single episode, moderate: Secondary | ICD-10-CM

## 2020-02-17 MED ORDER — SYMBICORT 160-4.5 MCG/ACT IN AERO: 2.0000 | INHALATION_SPRAY | Freq: Two times a day (BID) | RESPIRATORY_TRACT | 1 refills | Status: DC

## 2020-02-17 MED ORDER — PROPRANOLOL HCL ER 60 MG PO CP24: 60.0000 mg | ORAL_CAPSULE | Freq: Every day | ORAL | 1 refills | Status: DC

## 2020-02-17 MED ORDER — BUTALBITAL-APAP-CAFFEINE 50-325-40 MG PO TABS: 1.0000 | ORAL_TABLET | Freq: Two times a day (BID) | ORAL | 0 refills | Status: DC | PRN

## 2020-02-17 MED ORDER — ALBUTEROL SULFATE HFA 108 (90 BASE) MCG/ACT IN AERS: 2.0000 | INHALATION_SPRAY | Freq: Four times a day (QID) | RESPIRATORY_TRACT | 3 refills | Status: DC | PRN

## 2020-02-17 MED ORDER — PREDNISONE 20 MG PO TABS: 40.0000 mg | ORAL_TABLET | Freq: Every day | ORAL | 0 refills | Status: DC

## 2020-02-17 NOTE — Progress Notes (Signed)
[name] DOB: 04/25/1956 Encounter date: 02/17/2020  This is a 64 y.o. female who presents with Chief Complaint  Patient presents with  . Follow-up    patient states she had surgery on March 19th due to lung cancer and requests to discuss  . Medication Refill  . Tremors    patient states she has had "tremors in her voice"  . Headache    severe headaches x10 days, states she has migraines but this feels different    History of present illness: History of stage Ib (T2, N0) adenocarcinoma status post lingular to sick contact me in June 2019 as well as right lower lobe superior segmentectomy on December 18, 2019.  Husband had a stroke 2 weeks after she got home from hospital. He was very agitated for awhile, but has gotten better.   Headaches she is having: feel like a band around her head. Not like regular migraine because she doesn't have aura. She worries about this because husband had multiple days of bad headache prior to stroke. Takes ibuprofen and it makes it go away (800mg ). Sometimes wakes with them; sometimes hits in middle of day. Has had a headache for a week. Usually doesn't come back until next day with ibuprofen. Feels like she has one just starting now. Lot of stress taking care of husband.   Wanted to get med refills for 90 days. Hard for her to get into drug store.   Family notes voice tremor - sometimes she notes those. Before Christmas, niece asked if she was crying because of shakiness of voice. No trouble swallowing.    Omeprazole still working for her; gets this otc.   Does have some pressure behind eyes. No vision changes.    HPI   Allergies  Allergen Reactions  . Aspirin Shortness Of Breath  . Morphine Sulfate Other (See Comments)    RED STREAKS ON ARMS.  Marland Kitchen Oxycodone Itching and Rash  . Glycopyrrolate     UNSPECIFIED REACTION  pt unsure if she is allergic.  . Lactose Intolerance (Gi) Nausea And Vomiting    Milk only.Patient states that she can eat  cheese.  Marland Kitchen Penicillins Other (See Comments)     Heartburn, upset stomach only & tolerated AMOXIL after this reaction Has patient had a PCN reaction causing immediate rash, facial/tongue/throat swelling, SOB or lightheadedness with hypotension: No Has patient had a PCN reaction causing severe rash involving mucus membranes or skin necrosis: No Has patient had a PCN reaction that required hospitalization: No Has patient had a PCN reaction occurring within the last 10 years: #  #  #  YES  #  #  #   . Sulfa Antibiotics Tinitus   Current Meds  Medication Sig  . albuterol (VENTOLIN HFA) 108 (90 Base) MCG/ACT inhaler Inhale 2 puffs into the lungs every 6 (six) hours as needed for shortness of breath.  . gabapentin (NEURONTIN) 300 MG capsule Take 1 capsule (300 mg total) by mouth 3 (three) times daily.  Marland Kitchen guaiFENesin (MUCINEX) 600 MG 12 hr tablet Take 1 tablet (600 mg total) by mouth 2 (two) times daily as needed for cough or to loosen phlegm.  Marland Kitchen ibuprofen (ADVIL,MOTRIN) 200 MG tablet Take 400-600 mg by mouth every 6 (six) hours as needed for mild pain or moderate pain.   . Melatonin 10 MG TABS Take 10 mg by mouth at bedtime.  Marland Kitchen omeprazole (PRILOSEC) 40 MG capsule Take 20 mg by mouth daily.   Marland Kitchen PARoxetine (PAXIL) 20 MG  tablet Take 1 tablet (20 mg total) by mouth daily.  Vladimir Faster Glycol-Propyl Glycol (SYSTANE OP) Place 1 drop into both eyes 2 (two) times daily as needed (dry eyes).  . propranolol ER (INDERAL LA) 60 MG 24 hr capsule Take 1 capsule (60 mg total) by mouth daily.  . SYMBICORT 160-4.5 MCG/ACT inhaler Inhale 2 puffs into the lungs 2 (two) times daily.  . traZODone (DESYREL) 50 MG tablet TAKE 1/2 TO 1 (ONE-HALF TO ONE) TABLET BY MOUTH AT BEDTIME AS NEEDED FOR SLEEP  . [DISCONTINUED] albuterol (PROVENTIL HFA;VENTOLIN HFA) 108 (90 Base) MCG/ACT inhaler Inhale 2 puffs into the lungs every 6 (six) hours as needed for shortness of breath.  . [DISCONTINUED] propranolol ER (INDERAL LA) 60 MG 24  hr capsule Take 1 capsule by mouth once daily  . [DISCONTINUED] SYMBICORT 160-4.5 MCG/ACT inhaler Inhale 2 puffs by mouth twice daily    Review of Systems  Constitutional: Negative for chills, fatigue and fever.  HENT: Positive for nosebleeds (after hospitalization) and sinus pressure (forehead, behind eyes). Negative for congestion, ear discharge, ear pain, hearing loss and sore throat.   Eyes: Negative for photophobia, pain and visual disturbance.  Respiratory: Negative for cough, chest tightness, shortness of breath and wheezing.        Breathing has been feeling better since stopping smoking and recovering from surgery  Cardiovascular: Negative for chest pain, palpitations and leg swelling.  Gastrointestinal: Negative for abdominal pain, nausea and vomiting.  Neurological: Positive for headaches. Negative for dizziness and light-headedness.  Psychiatric/Behavioral: Negative for sleep disturbance (does great with trazodone).    Objective:  BP 120/80 (BP Location: Left Arm, Patient Position: Sitting, Cuff Size: Normal)   Pulse (!) 50   Temp 97.9 F (36.6 C) (Temporal)   Ht 5\' 5"  (1.651 m)   Wt 123 lb 11.2 oz (56.1 kg)   SpO2 96%   BMI 20.58 kg/m   Weight: 123 lb 11.2 oz (56.1 kg)   BP Readings from Last 3 Encounters:  02/17/20 120/80  01/05/20 (!) 150/72  12/22/19 (!) 99/53   Wt Readings from Last 3 Encounters:  02/17/20 123 lb 11.2 oz (56.1 kg)  01/05/20 121 lb (54.9 kg)  12/18/19 116 lb (52.6 kg)    Physical Exam Constitutional:      General: She is not in acute distress.    Appearance: She is well-developed.  HENT:     Right Ear: Ear canal and external ear normal.     Left Ear: Ear canal and external ear normal.     Ears:     Comments: Cerumen blocks full view TM bilat Eyes:     Pupils: Pupils are equal, round, and reactive to light.  Neck:     Thyroid: No thyroid mass or thyromegaly.  Cardiovascular:     Rate and Rhythm: Normal rate and regular rhythm.      Heart sounds: Normal heart sounds. No murmur. No friction rub.  Pulmonary:     Effort: Pulmonary effort is normal. No respiratory distress.     Breath sounds: Normal breath sounds. No wheezing or rales.  Musculoskeletal:     Cervical back: Full passive range of motion without pain.     Right lower leg: No edema.     Left lower leg: No edema.  Lymphadenopathy:     Cervical:     Right cervical: No superficial, deep or posterior cervical adenopathy.    Left cervical: No superficial, deep or posterior cervical adenopathy.  Neurological:  Mental Status: She is alert and oriented to person, place, and time.  Psychiatric:        Attention and Perception: Attention normal.        Mood and Affect: Mood normal.        Behavior: Behavior normal.     Assessment/Plan 1. Adenocarcinoma of left lung, stage 1 ([place]) She is doing well status post surgery.  She has quit smoking, which is fantastic.  She made a promise to her granddaughter and feels that she will be able to keep this from [place].  2. Depression, major, single episode, moderate ([place]) She does struggle with mood, but feels this is mostly related to stress at home.  Unfortunately, she does not have other options for caregiver for her spouse.  His mood is somewhat better after starting Remeron, and she is willing to monitor symptoms and see how he does as he adjusted his medication.  3. Simple chronic bronchitis ([facility]) Breathing has been stable.  She states it feels much better since quitting smoking.  She is using the Symbicort on a regular basis as well as albuterol twice daily. - SYMBICORT 160-4.5 MCG/ACT inhaler; Inhale 2 puffs into the lungs 2 (two) times daily.  Dispense: 3 Inhaler; Refill: 1   4. Tremor Slight tremor in voice does not bother her.  She has no difficulty with speaking, eating, swallowing.  We discussed that this is likely a benign tremor.  Let us know if any worsening.  Propranolol should help.  It bothers other people  more than it bothers her. - propranolol ER (INDERAL LA) 60 MG 24 hr capsule; Take 1 capsule (60 mg total) by mouth daily.  Dispense: 90 capsule; Refill: 1   5.  Headache Seems to come up with recent weather changes.  I suspect that there is some sinus relation to head pain, as does patient.  Ibuprofen does take away the headache, although headache is fairly intense when she gets it.  We discussed starting Claritin or similar antihistamine to help with any potential sinus symptoms.  We discussed using a short prednisone burst to help open up the sinuses and see if this relieves headache pain.  Fioricet if needed for head pain as we are trying to avoid ibuprofen overuse.  We discussed trying not to use medications unless necessary.  Taking good care of self, staying hydrated, sleeping are also good headache prevention measures.  Have asked her to update me in 2 days on how headache is doing prior to the weekend.  Return for pending mychart update.  Micheline Rough, MD

## 2020-02-17 NOTE — Patient Instructions (Addendum)
Try to avoid taking medication for headaches if possible. If needed, use the fioricet.   Stay well hydrated. Start antihistamine. If headaches do not seem to be improving; take the prednisone as directed. If any worsening we need to talk to you.

## 2020-03-29 ENCOUNTER — Other Ambulatory Visit: Payer: Self-pay | Admitting: Family Medicine

## 2020-04-08 ENCOUNTER — Other Ambulatory Visit: Payer: Self-pay

## 2020-04-08 ENCOUNTER — Inpatient Hospital Stay: Payer: BC Managed Care – PPO | Attending: Internal Medicine

## 2020-04-08 ENCOUNTER — Encounter (HOSPITAL_COMMUNITY): Payer: Self-pay

## 2020-04-08 ENCOUNTER — Ambulatory Visit (HOSPITAL_COMMUNITY)
Admission: RE | Admit: 2020-04-08 | Discharge: 2020-04-08 | Disposition: A | Discharge status: Home / Self Care | Payer: BC Managed Care – PPO | Source: Ambulatory Visit | Attending: Internal Medicine | Admitting: Internal Medicine

## 2020-04-08 DIAGNOSIS — Z885 Allergy status to narcotic agent status: Secondary | ICD-10-CM | POA: Diagnosis not present

## 2020-04-08 DIAGNOSIS — Z880 Allergy status to penicillin: Secondary | ICD-10-CM | POA: Diagnosis not present

## 2020-04-08 DIAGNOSIS — I700 Atherosclerosis of aorta: Secondary | ICD-10-CM | POA: Insufficient documentation

## 2020-04-08 DIAGNOSIS — C3490 Malignant neoplasm of unspecified part of unspecified bronchus or lung: Secondary | ICD-10-CM

## 2020-04-08 DIAGNOSIS — Z882 Allergy status to sulfonamides status: Secondary | ICD-10-CM | POA: Insufficient documentation

## 2020-04-08 DIAGNOSIS — J439 Emphysema, unspecified: Secondary | ICD-10-CM | POA: Diagnosis not present

## 2020-04-08 DIAGNOSIS — Z79899 Other long term (current) drug therapy: Secondary | ICD-10-CM | POA: Diagnosis not present

## 2020-04-08 DIAGNOSIS — F329 Major depressive disorder, single episode, unspecified: Secondary | ICD-10-CM | POA: Diagnosis not present

## 2020-04-08 DIAGNOSIS — C3412 Malignant neoplasm of upper lobe, left bronchus or lung: Secondary | ICD-10-CM | POA: Diagnosis present

## 2020-04-08 DIAGNOSIS — I2510 Atherosclerotic heart disease of native coronary artery without angina pectoris: Secondary | ICD-10-CM | POA: Insufficient documentation

## 2020-04-08 DIAGNOSIS — Z886 Allergy status to analgesic agent status: Secondary | ICD-10-CM | POA: Diagnosis not present

## 2020-04-08 LAB — CBC WITH DIFFERENTIAL (CANCER CENTER ONLY)
Abs Immature Granulocytes: 0.02 10*3/uL (ref 0.00–0.07)
Basophils Absolute: 0 10*3/uL (ref 0.0–0.1)
Basophils Relative: 1 %
Eosinophils Absolute: 0.1 10*3/uL (ref 0.0–0.5)
Eosinophils Relative: 2 %
HCT: 44.5 % (ref 36.0–46.0)
Hemoglobin: 14.5 g/dL (ref 12.0–15.0)
Immature Granulocytes: 0 %
Lymphocytes Relative: 32 %
Lymphs Abs: 2 10*3/uL (ref 0.7–4.0)
MCH: 28.3 pg (ref 26.0–34.0)
MCHC: 32.6 g/dL (ref 30.0–36.0)
MCV: 86.7 fL (ref 80.0–100.0)
Monocytes Absolute: 0.7 10*3/uL (ref 0.1–1.0)
Monocytes Relative: 11 %
Neutro Abs: 3.3 10*3/uL (ref 1.7–7.7)
Neutrophils Relative %: 54 %
Platelet Count: 243 10*3/uL (ref 150–400)
RBC: 5.13 MIL/uL — ABNORMAL HIGH (ref 3.87–5.11)
RDW: 12.7 % (ref 11.5–15.5)
WBC Count: 6.2 10*3/uL (ref 4.0–10.5)
nRBC: 0 % (ref 0.0–0.2)

## 2020-04-08 LAB — CMP (CANCER CENTER ONLY)
ALT: 11 U/L (ref 0–44)
AST: 15 U/L (ref 15–41)
Albumin: 4.1 g/dL (ref 3.5–5.0)
Alkaline Phosphatase: 112 U/L (ref 38–126)
Anion gap: 10 (ref 5–15)
BUN: 14 mg/dL (ref 8–23)
CO2: 25 mmol/L (ref 22–32)
Calcium: 10 mg/dL (ref 8.9–10.3)
Chloride: 104 mmol/L (ref 98–111)
Creatinine: 0.9 mg/dL (ref 0.44–1.00)
GFR, Est AFR Am: >60 mL/min (ref >60)
GFR, Estimated: >60 mL/min (ref >60)
Glucose, Bld: 100 mg/dL — ABNORMAL HIGH (ref 70–99)
Potassium: 4.2 mmol/L (ref 3.5–5.1)
Sodium: 139 mmol/L (ref 135–145)
Total Bilirubin: 0.6 mg/dL (ref 0.3–1.2)
Total Protein: 8 g/dL (ref 6.5–8.1)

## 2020-04-08 MED ORDER — IOHEXOL 300 MG/ML  SOLN
75.0000 mL | Freq: Once | INTRAMUSCULAR | Status: AC | PRN
  Administered 2020-04-08: 75 mL via INTRAVENOUS

## 2020-04-08 MED ORDER — SODIUM CHLORIDE (PF) 0.9 % IJ SOLN
INTRAMUSCULAR | Status: AC
  Filled 2020-04-08: qty 50

## 2020-04-11 ENCOUNTER — Inpatient Hospital Stay (HOSPITAL_BASED_OUTPATIENT_CLINIC_OR_DEPARTMENT_OTHER): Payer: BC Managed Care – PPO | Admitting: Internal Medicine

## 2020-04-11 ENCOUNTER — Encounter: Payer: Self-pay | Admitting: Internal Medicine

## 2020-04-11 ENCOUNTER — Other Ambulatory Visit: Payer: Self-pay

## 2020-04-11 VITALS — BP 117/56 | HR 60 | Temp 97.7°F | Resp 18 | Ht 65.0 in | Wt 122.6 lb

## 2020-04-11 DIAGNOSIS — C3491 Malignant neoplasm of unspecified part of right bronchus or lung: Secondary | ICD-10-CM

## 2020-04-11 DIAGNOSIS — C3412 Malignant neoplasm of upper lobe, left bronchus or lung: Secondary | ICD-10-CM | POA: Diagnosis not present

## 2020-04-11 DIAGNOSIS — Z902 Acquired absence of lung [part of]: Secondary | ICD-10-CM | POA: Diagnosis not present

## 2020-04-11 DIAGNOSIS — C3492 Malignant neoplasm of unspecified part of left bronchus or lung: Secondary | ICD-10-CM | POA: Diagnosis not present

## 2020-04-11 DIAGNOSIS — C3490 Malignant neoplasm of unspecified part of unspecified bronchus or lung: Secondary | ICD-10-CM | POA: Diagnosis not present

## 2020-04-11 HISTORY — DX: Malignant neoplasm of unspecified part of right bronchus or lung: C34.91

## 2020-04-11 NOTE — Progress Notes (Signed)
[facility] Telephone:([phone]) [phone]   Fax:([phone]) [phone]  OFFICE PROGRESS NOTE  [name], MD [place] [address]  DIAGNOSIS:  1) Stage Ib (T2, N0, M0) non-small cell lung cancer, adenocarcinomadiagnosed in June 2019. 2) stage Ia (T1 a, N0, M0) non-small cell lung cancer, adenocarcinoma status post  PRIOR THERAPY:  1) status post wedge resection of the left upper lobe nodule in addition to lingular segmentectomy and mediastinal lymph node sampling under the care of Dr. Roxan Hockey on March 13, 2018.  Right lower lobe segmentectomy under the care of Dr. Roxan Hockey on December 18, 2019.  CURRENT THERAPY: Observation.  INTERVAL HISTORY: [name] 64 y.o. female returns to the clinic today for follow-up visit.  The patient is feeling fine today with no concerning complaints.  She quit smoking in March 2021.  She denied having any current chest pain, shortness of breath, cough or hemoptysis.  She denied having any fever or chills.  She has no nausea, vomiting, diarrhea or constipation.  She has no headache or visual changes.  She continues on observation and feeling fine.  She had repeat CT scan of the chest performed recently and she is here for evaluation and discussion of her scan results and treatment options.  MEDICAL HISTORY: Past Medical History:  Diagnosis Date   Anxiety    Arthritis of hand 04/17/2017   Bronchitis    Depression    Dyspnea    Emphysema, unspecified ([place])    per patient mild case   Family history of adverse reaction to anesthesia    patient states sister has a hard time waking up from anesthesia.    Hepatitis C    lung ca dx'd 03/2018   Tremor, unspecified    non-specific; takes inderal.     ALLERGIES:  is allergic to aspirin, morphine sulfate, oxycodone, glycopyrrolate, lactose intolerance (gi), penicillins, and sulfa antibiotics.  MEDICATIONS:  Current Outpatient Medications  Medication  Sig Dispense Refill   albuterol (VENTOLIN HFA) 108 (90 Base) MCG/ACT inhaler Inhale 2 puffs into the lungs every 6 (six) hours as needed for shortness of breath. 54 g 3   butalbital-acetaminophen-caffeine (FIORICET) 50-325-40 MG tablet Take 1-2 tablets by mouth 2 (two) times daily as needed for headache. 20 tablet 0   fluticasone (FLONASE) 50 MCG/ACT nasal spray Place 1 spray into both nostrils 2 (two) times daily. 16 g 5   gabapentin (NEURONTIN) 300 MG capsule Take 1 capsule (300 mg total) by mouth 3 (three) times daily. 90 capsule 1   guaiFENesin (MUCINEX) 600 MG 12 hr tablet Take 1 tablet (600 mg total) by mouth 2 (two) times daily as needed for cough or to loosen phlegm.     ibuprofen (ADVIL,MOTRIN) 200 MG tablet Take 400-600 mg by mouth every 6 (six) hours as needed for mild pain or moderate pain.      Melatonin 10 MG TABS Take 10 mg by mouth at bedtime.     omeprazole (PRILOSEC) 40 MG capsule Take 20 mg by mouth daily.      PARoxetine (PAXIL) 20 MG tablet Take 1 tablet by mouth once daily 90 tablet 0   Polyethyl Glycol-Propyl Glycol (SYSTANE OP) Place 1 drop into both eyes 2 (two) times daily as needed (dry eyes).     predniSONE (DELTASONE) 20 MG tablet Take 2 tablets (40 mg total) by mouth daily with breakfast. 10 tablet 0   propranolol ER (INDERAL LA) 60 MG 24 hr capsule Take  1 capsule (60 mg total) by mouth daily. 90 capsule 1   SYMBICORT 160-4.5 MCG/ACT inhaler Inhale 2 puffs into the lungs 2 (two) times daily. 3 Inhaler 1   traZODone (DESYREL) 50 MG tablet TAKE 1/2 TO 1 (ONE-HALF TO ONE) TABLET BY MOUTH AT BEDTIME AS NEEDED FOR SLEEP 45 tablet 2   No current facility-administered medications for this visit.    SURGICAL HISTORY:  Past Surgical History:  Procedure Laterality Date   ABDOMINAL HYSTERECTOMY  1986   menorrhagia; hx of cryo for abnormal paps   APPENDECTOMY     removed in 1986 per pt   INTERCOSTAL NERVE BLOCK Right 12/18/2019   Procedure: Intercostal  Nerve Block;  Surgeon: Melrose Nakayama, MD;  Location: [facility];  Service: Thoracic;  Laterality: Right;   LOBECTOMY Left 03/13/2018   Procedure: Lingula section of left upper lobe lobectomy;  Surgeon: Melrose Nakayama, MD;  Location: [facility];  Service: Thoracic;  Laterality: Left;   NODE DISSECTION  12/18/2019   Procedure: Node Dissection;  Surgeon: Melrose Nakayama, MD;  Location: [facility];  Service: Thoracic;;   VIDEO ASSISTED THORACOSCOPY (VATS)/WEDGE RESECTION Left 03/13/2018   Procedure: VIDEO ASSISTED THORACOSCOPY (VATS)/WEDGE RESECTION;  Surgeon: Melrose Nakayama, MD;  Location: [facility] OR;  Service: Thoracic;  Laterality: Left;    REVIEW OF SYSTEMS:  A comprehensive review of systems was negative.   PHYSICAL EXAMINATION: General appearance: alert, cooperative and no distress Head: Normocephalic, without obvious abnormality, atraumatic Neck: no adenopathy, no JVD, supple, symmetrical, trachea midline and thyroid not enlarged, symmetric, no tenderness/mass/nodules Lymph nodes: Cervical, supraclavicular, and axillary nodes normal. Resp: clear to auscultation bilaterally Back: symmetric, no curvature. ROM normal. No CVA tenderness. Cardio: regular rate and rhythm, S1, S2 normal, no murmur, click, rub or gallop GI: soft, non-tender; bowel sounds normal; no masses,  no organomegaly Extremities: extremities normal, atraumatic, no cyanosis or edema  ECOG PERFORMANCE STATUS: 0 - Asymptomatic  Blood pressure (!) 117/56, pulse 60, temperature 97.7 F (36.5 C), temperature source Temporal, resp. rate 18, height 5\' 5"  (1.651 m), weight 122 lb 9.6 oz (55.6 kg), SpO2 100 %.  LABORATORY DATA: Lab Results  Component Value Date   WBC 6.2 04/08/2020   HGB 14.5 04/08/2020   HCT 44.5 04/08/2020   MCV 86.7 04/08/2020   PLT 243 04/08/2020      Chemistry      Component Value Date/Time   NA 139 04/08/2020 0851   K 4.2 04/08/2020 0851   CL 104 04/08/2020 0851   CO2 25 04/08/2020 0851    BUN 14 04/08/2020 0851   CREATININE 0.90 04/08/2020 0851   CREATININE 0.80 09/04/2019 1545      Component Value Date/Time   CALCIUM 10.0 04/08/2020 0851   ALKPHOS 112 04/08/2020 0851   AST 15 04/08/2020 0851   ALT 11 04/08/2020 0851   ALT 20 10/10/2017 1653   BILITOT 0.6 04/08/2020 0851       RADIOGRAPHIC STUDIES: CT Chest W Contrast  Result Date: 04/08/2020 CLINICAL DATA:  Primary Cancer Type: Lung Imaging Indication: Routine surveillance Interval therapy since last imaging?  Yes, surgery Initial Cancer Diagnosis Date: 12/18/2019; Established by: Biopsy-proven Detailed Pathology: Stage Ib non-small cell lung cancer, adenocarcinoma. Primary Tumor location: Left upper lobe and right lower lobe. Surgeries: Left upper lobe wedge resection and lingular segmentectomy 03/13/2018. Right lower lobe superior segementectomy 12/18/2019. Chemotherapy: No Immunotherapy? No Radiation therapy? No EXAM: CT CHEST WITH CONTRAST TECHNIQUE: Multidetector CT imaging of the chest was performed during  intravenous contrast administration. CONTRAST:  25mL OMNIPAQUE IOHEXOL 300 MG/ML  SOLN COMPARISON:  Most recent CT chest 11/23/2019.  12/03/2019 PET-CT. FINDINGS: Cardiovascular: Atherosclerosis of the thoracic aorta and branch vessels. Mediastinum/Nodes: Right upper paratracheal lymph node 0.7 cm in short axis on image 16/2, formerly 0.6 cm. Lower paratracheal node anterior to the carina 0.7 cm in short axis on image 53/2, previously the same. No pathologic adenopathy. Lungs/Pleura: Centrilobular emphysema. Biapical pleuroparenchymal scarring. Postoperative findings related to interval superior segmentectomy in the right lower lobe, with residual scarring along the staple line and associated volume loss. The superior segmentectomy contained the pulmonary nodule of concern based on review of the pathology results. Postoperative findings from prior left upper lobe wedge resection noted along the major fissure. Prior wedge  resection noted in the left lower lobe. Morphology of scarring associated with these postoperative regions unchanged. No new nodule. Upper Abdomen: Unremarkable Musculoskeletal: Mild thoracic kyphosis. IMPRESSION: 1. Postoperative findings related to interval superior segmentectomy in the right lower lobe, with residual scarring along the staple line and associated volume loss. No current worrisome nodule. 2. Postoperative findings from prior left lung wedge resections. 3. Emphysema and aortic atherosclerosis. Aortic Atherosclerosis (ICD10-I70.0) and Emphysema (ICD10-J43.9). Electronically Signed   By: Van Clines M.D.   On: 04/08/2020 11:00    ASSESSMENT AND PLAN: This is a very pleasant 64 years old white female with history of stage Ib non-small cell lung cancer, adenocarcinoma status post left upper segmental resection in addition to lingulectomy with mediastinal lymph node dissection in 2019. The patient also has a recent diagnosis of a stage Ia non-small cell lung cancer, adenocarcinoma status post right lower lobe segmentectomy. The patient is feeling fine. Her repeat CT scan of the chest showed no concerning findings for disease recurrence or metastasis. I recommended for the patient to continue on observation with repeat CT scan of the chest in 1 year. She was advised to call immediately if she has any concerning symptoms in the interval. The patient voices understanding of current disease status and treatment options and is in agreement with the current care plan.  All questions were answered. The patient knows to call the clinic with any problems, questions or concerns. We can certainly see the patient much sooner if necessary.  Disclaimer: This note was dictated with voice recognition software. Similar sounding words can inadvertently be transcribed and may not be corrected upon review.

## 2020-04-12 ENCOUNTER — Telehealth: Payer: Self-pay | Admitting: Internal Medicine

## 2020-04-12 NOTE — Telephone Encounter (Signed)
Scheduled appt per 7/12 los - mailed reminder letter with appt date and time

## 2020-04-29 ENCOUNTER — Other Ambulatory Visit: Payer: Self-pay

## 2020-04-29 ENCOUNTER — Encounter: Payer: Self-pay | Admitting: Adult Health

## 2020-04-29 ENCOUNTER — Ambulatory Visit (INDEPENDENT_AMBULATORY_CARE_PROVIDER_SITE_OTHER): Payer: BC Managed Care – PPO | Admitting: Adult Health

## 2020-04-29 VITALS — BP 122/70 | HR 65 | Temp 98.2°F | Wt 125.6 lb

## 2020-04-29 DIAGNOSIS — L03115 Cellulitis of right lower limb: Secondary | ICD-10-CM | POA: Diagnosis not present

## 2020-04-29 MED ORDER — DOXYCYCLINE HYCLATE 100 MG PO CAPS: 100.0000 mg | ORAL_CAPSULE | Freq: Two times a day (BID) | ORAL | 0 refills | Status: DC

## 2020-04-29 NOTE — Progress Notes (Signed)
   Subjective:    Patient ID: [name], female    DOB: 1956-02-05, 64 y.o.   MRN: [id number]  HPI    Review of Systems     Objective:   Physical Exam        Assessment & Plan:

## 2020-04-29 NOTE — Patient Instructions (Signed)
It was great seeing you today   I have prescribed doxycycline for the skin infection   You can continue to use neosporin but stop the essential oils and hydrogen peroxide

## 2020-04-29 NOTE — Progress Notes (Signed)
Subjective:    Patient ID: [name], female    DOB: 06/14/1956, 64 y.o.   MRN: [id number]  HPI 64 year old female who  has a past medical history of Anxiety, Arthritis of hand (04/17/2017), Bronchitis, Depression, Dyspnea, Emphysema, unspecified ([place]), Family history of adverse reaction to anesthesia, Hepatitis C, lung ca (dx'd 03/2018), and Tremor, unspecified.  She presents to the office today for wound on right shin and left hand.  Reports that she was scratched by her small dog about 2 weeks ago.  Since then she has been applying tea tree oil, hydrogen peroxide, and Neosporin to the wounds.  The wound on the right shin continues to be painful, red, and she has had some green drainage from the wound.   Review of Systems See HPI   Past Medical History:  Diagnosis Date  . Anxiety   . 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 Bronchitis   . Depression   . Dyspnea   . Emphysema, unspecified ([place])    per patient mild case  . Family history of adverse reaction to anesthesia    patient states sister has a hard time waking up from anesthesia.   . Hepatitis C   . lung ca dx'd 03/2018  . Tremor, unspecified    non-specific; takes inderal.     Social History   Socioeconomic History  . Marital status: Married    Spouse name: Not on file  . Number of children: Not on file  . Years of education: Not on file  . Highest education level: Not on file  Occupational History  . Not on file  Tobacco Use  . Smoking status: Former Smoker    Packs/day: 0.25    Types: Cigarettes    Quit date: 03/13/2018    Years since quitting: 2.1  . Smokeless tobacco: Never Used  . Tobacco comment: Enrolled in the 03/01 Quit Smoking Class Virtual  Vaping Use  . Vaping Use: Never used  Substance and Sexual Activity  . Alcohol use: Yes    Alcohol/week: 7.0 standard drinks    Types: 7 Shots of liquor per week    Comment: one margarita  every night  . Drug use: Not Currently    Frequency: 4.0 times per week      Types: Marijuana    Comment: occasional use. last use; 03/08/18  . Sexual activity: Never  Other Topics Concern  . Not on file  Social History Narrative  . Not on file   Social Determinants of Health   Financial Resource Strain:   . Difficulty of Paying Living Expenses:   Food Insecurity:   . Worried About Charity fundraiser in the Last Year:   . Arboriculturist in the Last Year:   Transportation Needs:   . Film/video editor (Medical):   Marland Kitchen Lack of Transportation (Non-Medical):   Physical Activity:   . Days of Exercise per Week:   . Minutes of Exercise per Session:   Stress:   . Feeling of Stress :   Social Connections:   . Frequency of Communication with Friends and Family:   . Frequency of Social Gatherings with Friends and Family:   . Attends Religious Services:   . Active Member of Clubs or Organizations:   . Attends Archivist Meetings:   Marland Kitchen Marital Status:   Intimate Partner Violence:   . Fear of Current or Ex-Partner:   . Emotionally Abused:   Marland Kitchen Physically Abused:   .  Sexually Abused:     Past Surgical History:  Procedure Laterality Date  . ABDOMINAL HYSTERECTOMY  1986   menorrhagia; hx of cryo for abnormal paps  . APPENDECTOMY     removed in 1986 per pt  . INTERCOSTAL NERVE BLOCK Right 12/18/2019   Procedure: Intercostal Nerve Block;  Surgeon: Melrose Nakayama, MD;  Location: [facility];  Service: Thoracic;  Laterality: Right;  . LOBECTOMY Left 03/13/2018   Procedure: Lingula section of left upper lobe lobectomy;  Surgeon: Melrose Nakayama, MD;  Location: [facility];  Service: Thoracic;  Laterality: Left;  . NODE DISSECTION  12/18/2019   Procedure: Node Dissection;  Surgeon: Melrose Nakayama, MD;  Location: [facility];  Service: Thoracic;;  . VIDEO ASSISTED THORACOSCOPY (VATS)/WEDGE RESECTION Left 03/13/2018   Procedure: VIDEO ASSISTED THORACOSCOPY (VATS)/WEDGE RESECTION;  Surgeon: Melrose Nakayama, MD;  Location: [facility];  Service: Thoracic;   Laterality: Left;    Family History  Problem Relation Age of Onset  . Asthma Mother   . Alzheimer's disease Mother 15  . Hypertension Mother   . Other Father        shot   . Neuropathy Sister        face  . Cancer Maternal Aunt        breast  . Cancer Paternal Aunt   . Cancer Paternal Grandmother        abdominal  . Lung cancer Paternal Uncle     Allergies  Allergen Reactions  . Aspirin Shortness Of Breath  . Morphine Sulfate Other (See Comments)    RED STREAKS ON ARMS.  Marland Kitchen Oxycodone Itching and Rash  . Glycopyrrolate     UNSPECIFIED REACTION  pt unsure if she is allergic.  . Lactose Intolerance (Gi) Nausea And Vomiting    Milk only.Patient states that she can eat cheese.  Marland Kitchen Penicillins Other (See Comments)     Heartburn, upset stomach only & tolerated AMOXIL after this reaction Has patient had a PCN reaction causing immediate rash, facial/tongue/throat swelling, SOB or lightheadedness with hypotension: No Has patient had a PCN reaction causing severe rash involving mucus membranes or skin necrosis: No Has patient had a PCN reaction that required hospitalization: No Has patient had a PCN reaction occurring within the last 10 years: #  #  #  YES  #  #  #   . Sulfa Antibiotics Tinitus    Current Outpatient Medications on File Prior to Visit  Medication Sig Dispense Refill  . albuterol (VENTOLIN HFA) 108 (90 Base) MCG/ACT inhaler Inhale 2 puffs into the lungs every 6 (six) hours as needed for shortness of breath. 54 g 3  . guaiFENesin (MUCINEX) 600 MG 12 hr tablet Take 1 tablet (600 mg total) by mouth 2 (two) times daily as needed for cough or to loosen phlegm.    Marland Kitchen ibuprofen (ADVIL,MOTRIN) 200 MG tablet Take 400-600 mg by mouth every 6 (six) hours as needed for mild pain or moderate pain.     Marland Kitchen loratadine (CLARITIN) 10 MG tablet Take 10 mg by mouth daily.    . Melatonin 10 MG TABS Take 10 mg by mouth at bedtime.    Marland Kitchen omeprazole (PRILOSEC) 40 MG capsule Take 20 mg by mouth  daily.     Marland Kitchen PARoxetine (PAXIL) 20 MG tablet Take 1 tablet by mouth once daily 90 tablet 0  . Polyethyl Glycol-Propyl Glycol (SYSTANE OP) Place 1 drop into both eyes 2 (two) times daily as needed (dry eyes).    Marland Kitchen  propranolol ER (INDERAL LA) 60 MG 24 hr capsule Take 1 capsule (60 mg total) by mouth daily. 90 capsule 1  . SYMBICORT 160-4.5 MCG/ACT inhaler Inhale 2 puffs into the lungs 2 (two) times daily. 3 Inhaler 1  . traZODone (DESYREL) 50 MG tablet TAKE 1/2 TO 1 (ONE-HALF TO ONE) TABLET BY MOUTH AT BEDTIME AS NEEDED FOR SLEEP 45 tablet 2  . fluticasone (FLONASE) 50 MCG/ACT nasal spray Place 1 spray into both nostrils 2 (two) times daily. 16 g 5   No current facility-administered medications on file prior to visit.    BP 122/70 (BP Location: Left Arm, Patient Position: Sitting, Cuff Size: Normal)   Pulse 65   Temp 98.2 F (36.8 C) (Oral)   Wt 125 lb 9.6 oz (57 kg)   SpO2 95%   BMI 20.90 kg/m       Objective:   Physical Exam Vitals and nursing note reviewed.  Constitutional:      Appearance: Normal appearance.  Skin:    General: Skin is warm and dry.     Capillary Refill: Capillary refill takes less than 2 seconds.     Comments: Well-healing wound noted on the left wrist.  She does have superficial wound that is approximately 1 cm in diameter on her right shin.  There is localized erythremia and warmth.  No streaking noted.  No drainage present  Neurological:     General: No focal deficit present.     Mental Status: She is alert and oriented to person, place, and time.  Psychiatric:        Mood and Affect: Mood normal.        Behavior: Behavior normal.        Thought Content: Thought content normal.        Judgment: Judgment normal.       Assessment & Plan:  1. Cellulitis of right leg  - doxycycline (VIBRAMYCIN) 100 MG capsule; Take 1 capsule (100 mg total) by mouth 2 (two) times daily.  Dispense: 14 capsule; Refill: 0 -Stop using tea tree oil and hydrogen peroxide as  this destroys healthy tissue.  Can continue with Neosporin if she would like.  Red flags reviewed, follow-up as needed  [name], NP

## 2020-05-06 ENCOUNTER — Other Ambulatory Visit: Payer: Self-pay | Admitting: Family Medicine

## 2020-05-06 DIAGNOSIS — G4700 Insomnia, unspecified: Secondary | ICD-10-CM

## 2020-05-09 ENCOUNTER — Other Ambulatory Visit: Payer: Self-pay | Admitting: Thoracic Surgery (Cardiothoracic Vascular Surgery)

## 2020-05-09 DIAGNOSIS — C3491 Malignant neoplasm of unspecified part of right bronchus or lung: Secondary | ICD-10-CM

## 2020-05-09 DIAGNOSIS — C3492 Malignant neoplasm of unspecified part of left bronchus or lung: Secondary | ICD-10-CM

## 2020-05-10 ENCOUNTER — Other Ambulatory Visit: Payer: Self-pay

## 2020-05-10 ENCOUNTER — Ambulatory Visit (INDEPENDENT_AMBULATORY_CARE_PROVIDER_SITE_OTHER): Payer: BC Managed Care – PPO | Admitting: Thoracic Surgery (Cardiothoracic Vascular Surgery)

## 2020-05-10 ENCOUNTER — Ambulatory Visit
Admission: RE | Admit: 2020-05-10 | Discharge: 2020-05-10 | Disposition: A | Discharge status: Home / Self Care | Payer: BC Managed Care – PPO | Source: Ambulatory Visit | Attending: Thoracic Surgery (Cardiothoracic Vascular Surgery) | Admitting: Thoracic Surgery (Cardiothoracic Vascular Surgery)

## 2020-05-10 VITALS — BP 113/58 | HR 73 | Temp 97.7°F | Resp 20 | Ht 65.0 in | Wt 126.0 lb

## 2020-05-10 DIAGNOSIS — Z85118 Personal history of other malignant neoplasm of bronchus and lung: Secondary | ICD-10-CM | POA: Diagnosis not present

## 2020-05-10 DIAGNOSIS — C3491 Malignant neoplasm of unspecified part of right bronchus or lung: Secondary | ICD-10-CM

## 2020-05-10 NOTE — Progress Notes (Signed)
[address]       [place] [address]             [phone]       HPI: [name] returns for a scheduled follow-up visit  [name] is a 64 year old former smoker with a history of COPD, stage Ib adenocarcinoma of the lingula, stage Ia adenocarcinoma in the superior segment of the right lower lobe, COPD, chronic bronchitis, and hepatitis C.  She had a lingular segmentectomy in June 2019 for a T2, N0 stage Ib adenocarcinoma.  She then had a robotic right lower lobe superior segmentectomy in March 2021.  She had a lot of neuropathic pain after her VATS lingular segmentectomy which responded to gabapentin.  We started on gabapentin prior to her robotic segmentectomy and she had much less pain with that.  She currently feels well.  She quit smoking and does not had any cravings or desire for cigarettes.  Her appetite is good.  Her weight is stable.  She will have an occasional sharp pain associated with her incisions but is not having any significant pain.  She saw Dr. Julien Nordmann in July and her CT showed no evidence of recurrence.  Past Medical History:  Diagnosis Date  . Anxiety   . 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 Bronchitis   . Depression   . Dyspnea   . Emphysema, unspecified ([place])    per patient mild case  . Family history of adverse reaction to anesthesia    patient states sister has a hard time waking up from anesthesia.   . Hepatitis C   . lung ca dx'd 03/2018  . Tremor, unspecified    non-specific; takes inderal.     Current Outpatient Medications  Medication Sig Dispense Refill  . albuterol (VENTOLIN HFA) 108 (90 Base) MCG/ACT inhaler Inhale 2 puffs into the lungs every 6 (six) hours as needed for shortness of breath. 54 g 3  . guaiFENesin (MUCINEX) 600 MG 12 hr tablet Take 1 tablet (600 mg total) by mouth 2 (two) times daily as needed for cough or to loosen phlegm.    Marland Kitchen ibuprofen (ADVIL,MOTRIN) 200 MG tablet Take 400-600 mg by mouth every 6  (six) hours as needed for mild pain or moderate pain.     Marland Kitchen loratadine (CLARITIN) 10 MG tablet Take 10 mg by mouth daily.    . Melatonin 10 MG TABS Take 10 mg by mouth at bedtime.    Marland Kitchen omeprazole (PRILOSEC) 40 MG capsule Take 20 mg by mouth daily.     Marland Kitchen PARoxetine (PAXIL) 20 MG tablet Take 1 tablet by mouth once daily 90 tablet 0  . propranolol ER (INDERAL LA) 60 MG 24 hr capsule Take 1 capsule (60 mg total) by mouth daily. 90 capsule 1  . SYMBICORT 160-4.5 MCG/ACT inhaler Inhale 2 puffs into the lungs 2 (two) times daily. 3 Inhaler 1  . traZODone (DESYREL) 50 MG tablet TAKE ONE HALF TO ONE TABLET BY MOUTH DAILY AT BEDTIME AS NEEDED FOR SLEEP 90 tablet 1  . doxycycline (VIBRAMYCIN) 100 MG capsule Take 1 capsule (100 mg total) by mouth 2 (two) times daily. (Patient not taking: Reported on 05/10/2020) 14 capsule 0  . fluticasone (FLONASE) 50 MCG/ACT nasal spray Place 1 spray into both nostrils 2 (two) times daily. 16 g 5   No current facility-administered medications for this visit.    Physical Exam BP (!) 113/58   Pulse 73   Temp 97.7  F (36.5 C) (Axillary)   Resp 20   Ht 5\' 5"  (1.651 m)   Wt 126 lb (57.2 kg)   SpO2 95%   BMI 20.70 kg/m  64 year old woman in no acute distress Alert and oriented x3 with no focal deficits Lungs clear bilaterally No cervical or supraclavicular adenopathy Cardiac regular rate and rhythm Incisions all well-healed  Diagnostic Tests: CHEST - 2 VIEW  COMPARISON:  04/08/2020  FINDINGS: Cardiac shadow is within normal limits. The lungs are well aerated bilaterally. Postsurgical changes are again seen and stable. Chronic blunting of costophrenic angles is noted. No focal infiltrate is seen. Degenerative change of the thoracic spine is noted.  IMPRESSION: Chronic changes without acute abnormality.   Electronically Signed   By: Inez Catalina M.D.   On: 05/10/2020 13:23  I personally reviewed the chest x-ray images and concur with the findings  noted above  Impression: [name] is a 64 year old woman with a history of tobacco abuse, COPD, chronic bronchitis, hepatitis C, and to lung cancers.  She had a T2, N0, stage Ib adenocarcinoma of the lingula treated with a segmentectomy.  About a year and a half later she had a right lower lobe superior segmentectomy for a T1, N0, stage Ia adenocarcinoma.  She is now 6 months out from most recent surgery with no evidence of recurrent disease.  She saw Dr. Julien Nordmann in July and had a CT which showed no evidence of recurrence.  She will see him back in a year and have another CT at that time.  I congratulated her on her abstinence from tobacco.  She feels well.  Her exercise tolerance is good.  She has an occasional pain but overall is doing very well postsurgically.  Plan: Follow-up with Dr. Julien Nordmann I will be happy to see her back anytime in the future if I can be of any further assistance with her care  I spent 10 minutes in review of records, images, and in consultation with [name] today. Melrose Nakayama, MD [facility] [phone]

## 2020-05-30 ENCOUNTER — Other Ambulatory Visit: Payer: Self-pay | Admitting: Family Medicine

## 2020-07-14 ENCOUNTER — Other Ambulatory Visit: Payer: Self-pay

## 2020-07-14 ENCOUNTER — Ambulatory Visit (INDEPENDENT_AMBULATORY_CARE_PROVIDER_SITE_OTHER): Payer: BC Managed Care – PPO | Admitting: Adult Health

## 2020-07-14 ENCOUNTER — Encounter: Payer: Self-pay | Admitting: Adult Health

## 2020-07-14 VITALS — BP 100/72 | HR 47 | Temp 98.2°F | Ht 65.0 in | Wt 128.5 lb

## 2020-07-14 DIAGNOSIS — R252 Cramp and spasm: Secondary | ICD-10-CM

## 2020-07-14 DIAGNOSIS — J42 Unspecified chronic bronchitis: Secondary | ICD-10-CM | POA: Diagnosis not present

## 2020-07-14 DIAGNOSIS — Z23 Encounter for immunization: Secondary | ICD-10-CM

## 2020-07-14 MED ORDER — FLUTICASONE-SALMETEROL 250-50 MCG/DOSE IN AEPB: 1.0000 | INHALATION_SPRAY | Freq: Two times a day (BID) | RESPIRATORY_TRACT | 1 refills | Status: DC

## 2020-07-14 NOTE — Progress Notes (Signed)
Subjective:    Patient ID: [name], female    DOB: April 28, 1956, 64 y.o.   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   This is a patient of Dr. Ethlyn Gallery , who is seeing me today as she is out of the office.  She is going on Medicare next month and needs a change from Symbicort to Advair as Symbicortt will no longer be covered under her new insurance plan and will cost her about $400 a month.  She also complains of bilateral lower extremity cramping that only happens at night.  Does not happen every night but has been more prevalent over the last few weeks.  When she gets up out of bed and walks around the cramping goes away.  Denies any cramping during the day.  Does not drink a whole lot of water throughout the day but does drink a lot of unsweetened tea.  She denies lower extremity edema, redness, or warmth  Review of Systems See HPI   Past Medical History:  Diagnosis Date  . Anxiety   . 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 Bronchitis   . Depression   . Dyspnea   . Emphysema, unspecified ([place])    per patient mild case  . Family history of adverse reaction to anesthesia    patient states sister has a hard time waking up from anesthesia.   . Hepatitis C   . lung ca dx'd 03/2018  . Tremor, unspecified    non-specific; takes inderal.     Social History   Socioeconomic History  . Marital status: Married    Spouse name: Not on file  . Number of children: Not on file  . Years of education: Not on file  . Highest education level: Not on file  Occupational History  . Not on file  Tobacco Use  . Smoking status: Former Smoker    Packs/day: 0.25    Types: Cigarettes    Quit date: 03/13/2018    Years since quitting: 2.3  . Smokeless  tobacco: Never Used  . Tobacco comment: Enrolled in the 03/01 Quit Smoking Class Virtual  Vaping Use  . Vaping Use: Never used  Substance and Sexual Activity  . Alcohol use: Yes    Alcohol/week: 7.0 standard drinks    Types: 7 Shots of liquor per week    Comment: one margarita  every night  . Drug use: Not Currently    Frequency: 4.0 times per week    Types: Marijuana    Comment: occasional use. last use; 03/08/18  . Sexual activity: Never  Other Topics Concern  . Not on file  Social History Narrative  . Not on file   Social Determinants of Health   Financial Resource Strain:   . Difficulty of Paying Living Expenses: Not on file  Food Insecurity:   . Worried About Charity fundraiser in the Last Year: Not on file  . Ran Out of Food in the Last Year: Not on file  Transportation Needs:   . Lack of Transportation (Medical): Not on file  . Lack of Transportation (Non-Medical): Not on file  Physical Activity:   . Days of Exercise per Week: Not on file  . Minutes of Exercise per Session: Not on file  Stress:   .  Feeling of Stress : Not on file  Social Connections:   . Frequency of Communication with Friends and Family: Not on file  . Frequency of Social Gatherings with Friends and Family: Not on file  . Attends Religious Services: Not on file  . Active Member of Clubs or Organizations: Not on file  . Attends Archivist Meetings: Not on file  . Marital Status: Not on file  Intimate Partner Violence:   . Fear of Current or Ex-Partner: Not on file  . Emotionally Abused: Not on file  . Physically Abused: Not on file  . Sexually Abused: Not on file    Past Surgical History:  Procedure Laterality Date  . ABDOMINAL HYSTERECTOMY  1986   menorrhagia; hx of cryo for abnormal paps  . APPENDECTOMY     removed in 1986 per pt  . INTERCOSTAL NERVE BLOCK Right 12/18/2019   Procedure: Intercostal Nerve Block;  Surgeon: Melrose Nakayama, MD;  Location: [facility];  Service:  Thoracic;  Laterality: Right;  . LOBECTOMY Left 03/13/2018   Procedure: Lingula section of left upper lobe lobectomy;  Surgeon: Melrose Nakayama, MD;  Location: [facility];  Service: Thoracic;  Laterality: Left;  . NODE DISSECTION  12/18/2019   Procedure: Node Dissection;  Surgeon: Melrose Nakayama, MD;  Location: [facility];  Service: Thoracic;;  . VIDEO ASSISTED THORACOSCOPY (VATS)/WEDGE RESECTION Left 03/13/2018   Procedure: VIDEO ASSISTED THORACOSCOPY (VATS)/WEDGE RESECTION;  Surgeon: Melrose Nakayama, MD;  Location: [facility];  Service: Thoracic;  Laterality: Left;    Family History  Problem Relation Age of Onset  . Asthma Mother   . Alzheimer's disease Mother 3  . Hypertension Mother   . Other Father        shot   . Neuropathy Sister        face  . Cancer Maternal Aunt        breast  . Cancer Paternal Aunt   . Cancer Paternal Grandmother        abdominal  . Lung cancer Paternal Uncle     Allergies  Allergen Reactions  . Aspirin Shortness Of Breath  . Morphine Sulfate Other (See Comments)    RED STREAKS ON ARMS.  Marland Kitchen Oxycodone Itching and Rash  . Glycopyrrolate     UNSPECIFIED REACTION  pt unsure if she is allergic.  . Lactose Intolerance (Gi) Nausea And Vomiting    Milk only.Patient states that she can eat cheese.  Marland Kitchen Penicillins Other (See Comments)     Heartburn, upset stomach only & tolerated AMOXIL after this reaction Has patient had a PCN reaction causing immediate rash, facial/tongue/throat swelling, SOB or lightheadedness with hypotension: No Has patient had a PCN reaction causing severe rash involving mucus membranes or skin necrosis: No Has patient had a PCN reaction that required hospitalization: No Has patient had a PCN reaction occurring within the last 10 years: #  #  #  YES  #  #  #   . Sulfa Antibiotics Tinitus    Current Outpatient Medications on File Prior to Visit  Medication Sig Dispense Refill  . albuterol (VENTOLIN HFA) 108 (90 Base) MCG/ACT  inhaler Inhale 2 puffs into the lungs every 6 (six) hours as needed for shortness of breath. 54 g 3  . guaiFENesin (MUCINEX) 600 MG 12 hr tablet Take 1 tablet (600 mg total) by mouth 2 (two) times daily as needed for cough or to loosen phlegm.    Marland Kitchen ibuprofen (ADVIL,MOTRIN) 200 MG tablet  Take 400-600 mg by mouth every 6 (six) hours as needed for mild pain or moderate pain.     Marland Kitchen loratadine (CLARITIN) 10 MG tablet Take 10 mg by mouth daily.    . Melatonin 10 MG TABS Take 10 mg by mouth at bedtime.    Marland Kitchen omeprazole (PRILOSEC) 40 MG capsule Take 20 mg by mouth daily.     Marland Kitchen PARoxetine (PAXIL) 20 MG tablet Take 1 tablet by mouth once daily 90 tablet 0  . propranolol ER (INDERAL LA) 60 MG 24 hr capsule Take 1 capsule (60 mg total) by mouth daily. 90 capsule 1  . SYMBICORT 160-4.5 MCG/ACT inhaler Inhale 2 puffs into the lungs 2 (two) times daily. 3 Inhaler 1  . traZODone (DESYREL) 50 MG tablet TAKE ONE HALF TO ONE TABLET BY MOUTH DAILY AT BEDTIME AS NEEDED FOR SLEEP 90 tablet 1  . fluticasone (FLONASE) 50 MCG/ACT nasal spray Place 1 spray into both nostrils 2 (two) times daily. 16 g 5   No current facility-administered medications on file prior to visit.    BP 100/72 (BP Location: Left Arm, Patient Position: Sitting, Cuff Size: Normal)   Pulse (!) 47   Ht $R'5\' 5"'Pw$  (1.651 m)   Wt 128 lb 8 oz (58.3 kg)   SpO2 98%   BMI 21.38 kg/m       Objective:   Physical Exam Vitals and nursing note reviewed.  Constitutional:      Appearance: Normal appearance.  Cardiovascular:     Rate and Rhythm: Normal rate and regular rhythm.     Pulses: Normal pulses.     Heart sounds: Normal heart sounds.  Pulmonary:     Effort: Pulmonary effort is normal.  Musculoskeletal:        General: No swelling or tenderness.     Right lower leg: No edema.     Left lower leg: No edema.  Skin:    General: Skin is warm and dry.     Capillary Refill: Capillary refill takes less than 2 seconds.  Neurological:     General: No  focal deficit present.     Mental Status: She is alert and oriented to person, place, and time.  Psychiatric:        Mood and Affect: Mood normal.        Behavior: Behavior normal.        Thought Content: Thought content normal.        Judgment: Judgment normal.           Assessment & Plan:  1. Chronic bronchitis, unspecified chronic bronchitis type (HCC)  - Fluticasone-Salmeterol (ADVAIR DISKUS) 250-50 MCG/DOSE AEPB; Inhale 1 puff into the lungs 2 (two) times daily.  Dispense: 90 each; Refill: 1  2. Leg cramping -Encouraged stretching before exercise, cutting back on tea as this could be causing a diuretic effect and causing some degree of dehydration, drink more water.  Can take magnesium supplement if needed.  Follow-up with PCP if no improvement - CMP with eGFR([facility]); Future - Magnesium; Future - Magnesium - CMP with eGFR([facility])  3. Need for immunization against influenza  - Flu Vaccine QUAD 6+ mos PF IM (Fluarix Quad PF)  [name], NP

## 2020-07-14 NOTE — Patient Instructions (Signed)
It was great seeing you today   I am going to do some blood work on you to see if any of your electrolytes are causing the cramping  Cut back on tea and start drinking more water. Also stretch before bed

## 2020-07-15 LAB — COMPLETE METABOLIC PANEL WITH GFR
AG Ratio: 1.5 (calc) (ref 1.0–2.5)
ALT: 11 U/L (ref 6–29)
AST: 19 U/L (ref 10–35)
Albumin: 4.8 g/dL (ref 3.6–5.1)
Alkaline phosphatase (APISO): 80 U/L (ref 37–153)
BUN: 24 mg/dL (ref 7–25)
CO2: 27 mmol/L (ref 20–32)
Calcium: 10.5 mg/dL — ABNORMAL HIGH (ref 8.6–10.4)
Chloride: 104 mmol/L (ref 98–110)
Creat: 0.92 mg/dL (ref 0.50–0.99)
GFR, Est African American: 76 mL/min/{1.73_m2} (ref >=60)
GFR, Est Non African American: 66 mL/min/{1.73_m2} (ref >=60)
Globulin: 3.1 g/dL (calc) (ref 1.9–3.7)
Glucose, Bld: 105 mg/dL — ABNORMAL HIGH (ref 65–99)
Potassium: 5.5 mmol/L — ABNORMAL HIGH (ref 3.5–5.3)
Sodium: 141 mmol/L (ref 135–146)
Total Bilirubin: 0.8 mg/dL (ref 0.2–1.2)
Total Protein: 7.9 g/dL (ref 6.1–8.1)

## 2020-07-15 LAB — MAGNESIUM: Magnesium: 2.3 mg/dL (ref 1.5–2.5)

## 2020-09-12 ENCOUNTER — Other Ambulatory Visit: Payer: Self-pay | Admitting: Family Medicine

## 2020-09-12 DIAGNOSIS — R251 Tremor, unspecified: Secondary | ICD-10-CM

## 2020-09-19 ENCOUNTER — Ambulatory Visit: Payer: Self-pay | Admitting: Family Medicine

## 2020-09-19 NOTE — Progress Notes (Deleted)
  Miniya Miguez DOB: 1955/11/05 Encounter date: 09/19/2020  This is a 64 y.o. female who presents with No chief complaint on file.   History of present illness:  HPI   Allergies  Allergen Reactions  . Aspirin Shortness Of Breath  . Morphine Sulfate Other (See Comments)    RED STREAKS ON ARMS.  Marland Kitchen Oxycodone Itching and Rash  . Glycopyrrolate     UNSPECIFIED REACTION  pt unsure if she is allergic.  . Lactose Intolerance (Gi) Nausea And Vomiting    Milk only.Patient states that she can eat cheese.  Marland Kitchen Penicillins Other (See Comments)     Heartburn, upset stomach only & tolerated AMOXIL after this reaction Has patient had a PCN reaction causing immediate rash, facial/tongue/throat swelling, SOB or lightheadedness with hypotension: No Has patient had a PCN reaction causing severe rash involving mucus membranes or skin necrosis: No Has patient had a PCN reaction that required hospitalization: No Has patient had a PCN reaction occurring within the last 10 years: #  #  #  YES  #  #  #   . Sulfa Antibiotics Tinitus   No outpatient medications have been marked as taking for the 09/19/20 encounter (Appointment) with [name], MD.    Review of Systems  Objective:  There were no vitals taken for this visit.      BP Readings from Last 3 Encounters:  07/14/20 100/72  05/10/20 (!) 113/58  04/29/20 122/70   Wt Readings from Last 3 Encounters:  07/14/20 128 lb 8 oz (58.3 kg)  05/10/20 126 lb (57.2 kg)  04/29/20 125 lb 9.6 oz (57 kg)    Physical Exam  Assessment/Plan  There are no diagnoses linked to this encounter.       Micheline Rough, MD

## 2020-10-24 ENCOUNTER — Other Ambulatory Visit: Payer: Self-pay | Admitting: Family Medicine

## 2020-11-13 ENCOUNTER — Other Ambulatory Visit: Payer: Self-pay | Admitting: Family Medicine

## 2020-11-13 DIAGNOSIS — G4700 Insomnia, unspecified: Secondary | ICD-10-CM

## 2020-11-14 ENCOUNTER — Telehealth: Payer: Self-pay | Admitting: Family Medicine

## 2020-11-16 ENCOUNTER — Telehealth: Payer: Self-pay | Admitting: Family Medicine

## 2020-11-16 NOTE — Telephone Encounter (Signed)
Spoke with the pt and informed her a refill was sent in today previously by Dr Ethlyn Gallery.

## 2020-11-16 NOTE — Telephone Encounter (Signed)
Patient is requesting a refill on her Trazodone.   Pharmacy: [facility] in [place].  Please advise.

## 2020-11-23 ENCOUNTER — Telehealth (INDEPENDENT_AMBULATORY_CARE_PROVIDER_SITE_OTHER): Payer: Self-pay | Admitting: Family Medicine

## 2020-11-23 ENCOUNTER — Encounter: Payer: Self-pay | Admitting: Family Medicine

## 2020-11-23 DIAGNOSIS — F419 Anxiety disorder, unspecified: Secondary | ICD-10-CM

## 2020-11-23 MED ORDER — ALPRAZOLAM 0.25 MG PO TABS: 0.2500 mg | ORAL_TABLET | Freq: Two times a day (BID) | ORAL | 0 refills | Status: DC | PRN

## 2020-11-23 NOTE — Progress Notes (Signed)
Virtual Visit via Video Note  I connected with [name]  on 11/24/20 at  1:00 PM EST by a video enabled telemedicine application and verified that I am speaking with the correct person using two identifiers.  Location patient: home Location provider: [place], [place] [address] Persons participating in the virtual visit: patient, provider  I discussed the limitations of evaluation and management by telemedicine and the availability of in person appointments. The patient expressed understanding and agreed to proceed.   [name] DOB: Sep 26, 1956 Encounter date: 11/23/2020  This is a 65 y.o. female who presents with Low back pain, anxiety   History of present illness: She is taking care of husband. Feels she has some caregiver burnout. Having a lot of anxiety attacks, shaking in legs. Sometimes she has to take several steps before getting balance back. Worse if she is in confined placed. Feels like legs are shaky. She has been dealing with anxiety by smoking, which she doesn't want to do.   Washing clothes daily for significant other (multiple loads, every day. Bedsheets, dirty/soiled clothes that he throws in with clean clothes). Just overwhelmed with all she has to do.   Carrying laundry basket 50 steps out to dryer and just by end of day doing laundry she is getting spasms in lower back. Just feels like she is falling apart.   She has no help in the house.   Husband (ex) turned away home health care. She has been caring for him since they separated and he needed care after his heart attack. He really has no other family that can care for him. Her children help her sometimes with doing her laundry. They are supportive, but she feels burden of being only one caregiving full time in her house.   Allergies  Allergen Reactions  . Aspirin Shortness Of Breath  . Morphine Sulfate Other (See Comments)    RED STREAKS ON ARMS.  Marland Kitchen Oxycodone  Itching and Rash  . Glycopyrrolate     UNSPECIFIED REACTION  pt unsure if she is allergic.  . Lactose Intolerance (Gi) Nausea And Vomiting    Milk only.Patient states that she can eat cheese.  Marland Kitchen Penicillins Other (See Comments)     Heartburn, upset stomach only & tolerated AMOXIL after this reaction Has patient had a PCN reaction causing immediate rash, facial/tongue/throat swelling, SOB or lightheadedness with hypotension: No Has patient had a PCN reaction causing severe rash involving mucus membranes or skin necrosis: No Has patient had a PCN reaction that required hospitalization: No Has patient had a PCN reaction occurring within the last 10 years: #  #  #  YES  #  #  #   . Sulfa Antibiotics Tinitus   No outpatient medications have been marked as taking for the 11/23/20 encounter (Video Visit) with [name], MD.    Review of Systems  Constitutional: Negative for chills, fatigue and fever.  Respiratory: Negative for cough, chest tightness, shortness of breath and wheezing.   Cardiovascular: Negative for chest pain, palpitations and leg swelling.  Musculoskeletal: Positive for back pain.  Psychiatric/Behavioral: Positive for agitation, decreased concentration and sleep disturbance. The patient is nervous/anxious.     Objective:  There were no vitals taken for this visit.      BP Readings from Last 3 Encounters:  07/14/20 100/72  05/10/20 (!) 113/58  04/29/20 122/70   Wt Readings from Last 3 Encounters:  07/14/20 128 lb 8 oz (  58.3 kg)  05/10/20 126 lb (57.2 kg)  04/29/20 125 lb 9.6 oz (57 kg)    EXAM:  GENERAL: alert, oriented, appears well and in no acute distress  HEENT: atraumatic, conjunctiva clear, no obvious abnormalities on inspection of external nose and ears  NECK: normal movements of the head and neck  LUNGS: on inspection no signs of respiratory distress, breathing rate appears normal, no obvious gross SOB, gasping or wheezing  CV: no obvious  cyanosis  MS: moves all visible extremities without noticeable abnormality  PSYCH/NEURO: pleasant and cooperative, but tearful, anxious. No thoughts of hurting self. She is just extremely overwhelmed, frustrated.   Assessment/Plan I have advised her to increase her Paxil to 40 mg daily. If having that this will help with some of her anxiety. I also did refill Xanax for her (she had this in the past) to help with moments of more severe anxiety/panic attacks. She does feel that therapy would be helpful after our discussion today and states she will call the back of her insurance card to get a list of covered therapists. She is going to update me in 2 to 3 weeks through MyChart to let me know how she is doing with the Paxil increase. I have encouraged her to give out will be most helpful for her in the house to take some of the burden off of her with caregiving. I think that even if husband refuses to interact with other people coming to the house, it would be helpful for her to have someone coming in to do laundry and cleaning to take some weight off of her shoulders. Her ex husband is a patient here, so we can help with arranging this.     I discussed the assessment and treatment plan with the patient. The patient was provided an opportunity to ask questions and all were answered. The patient agreed with the plan and demonstrated an understanding of the instructions.   The patient was advised to call back or seek an in-person evaluation if the symptoms worsen or if the condition fails to improve as anticipated.  I provided 30 minutes of non-face-to-face time during this encounter.   Micheline Rough, MD

## 2020-11-24 MED ORDER — PAROXETINE HCL 20 MG PO TABS: 40.0000 mg | ORAL_TABLET | Freq: Every day | ORAL | 0 refills | Status: DC

## 2020-11-25 NOTE — Telephone Encounter (Signed)
error 

## 2020-12-26 ENCOUNTER — Other Ambulatory Visit: Payer: Self-pay | Admitting: Family Medicine

## 2020-12-26 DIAGNOSIS — R251 Tremor, unspecified: Secondary | ICD-10-CM

## 2021-02-25 ENCOUNTER — Other Ambulatory Visit: Payer: Self-pay | Admitting: Family Medicine

## 2021-02-25 DIAGNOSIS — J989 Respiratory disorder, unspecified: Secondary | ICD-10-CM

## 2021-03-01 ENCOUNTER — Encounter: Payer: Self-pay | Admitting: Adult Health

## 2021-03-01 ENCOUNTER — Other Ambulatory Visit: Payer: Self-pay

## 2021-03-01 ENCOUNTER — Ambulatory Visit (INDEPENDENT_AMBULATORY_CARE_PROVIDER_SITE_OTHER): Payer: Medicare Other | Admitting: Adult Health

## 2021-03-01 VITALS — HR 72 | Temp 97.7°F | Ht 65.0 in | Wt 114.0 lb

## 2021-03-01 DIAGNOSIS — R634 Abnormal weight loss: Secondary | ICD-10-CM

## 2021-03-01 DIAGNOSIS — K2100 Gastro-esophageal reflux disease with esophagitis, without bleeding: Secondary | ICD-10-CM

## 2021-03-01 DIAGNOSIS — E559 Vitamin D deficiency, unspecified: Secondary | ICD-10-CM | POA: Diagnosis not present

## 2021-03-01 DIAGNOSIS — F321 Major depressive disorder, single episode, moderate: Secondary | ICD-10-CM | POA: Diagnosis not present

## 2021-03-01 DIAGNOSIS — E538 Deficiency of other specified B group vitamins: Secondary | ICD-10-CM | POA: Diagnosis not present

## 2021-03-01 LAB — CBC WITH DIFFERENTIAL/PLATELET
Basophils Absolute: 0 10*3/uL (ref 0.0–0.1)
Basophils Relative: 0.8 % (ref 0.0–3.0)
Eosinophils Absolute: 0.4 10*3/uL (ref 0.0–0.7)
Eosinophils Relative: 5.9 % — ABNORMAL HIGH (ref 0.0–5.0)
HCT: 47.5 % — ABNORMAL HIGH (ref 36.0–46.0)
Hemoglobin: 15.9 g/dL — ABNORMAL HIGH (ref 12.0–15.0)
Lymphocytes Relative: 30.3 % (ref 12.0–46.0)
Lymphs Abs: 2 10*3/uL (ref 0.7–4.0)
MCHC: 33.4 g/dL (ref 30.0–36.0)
MCV: 88.2 fl (ref 78.0–100.0)
Monocytes Absolute: 0.6 10*3/uL (ref 0.1–1.0)
Monocytes Relative: 8.5 % (ref 3.0–12.0)
Neutro Abs: 3.6 10*3/uL (ref 1.4–7.7)
Neutrophils Relative %: 54.5 % (ref 43.0–77.0)
Platelets: 251 10*3/uL (ref 150.0–400.0)
RBC: 5.38 Mil/uL — ABNORMAL HIGH (ref 3.87–5.11)
RDW: 13.5 % (ref 11.5–15.5)
WBC: 6.6 10*3/uL (ref 4.0–10.5)

## 2021-03-01 LAB — TSH: TSH: 3.82 u[IU]/mL (ref 0.35–4.50)

## 2021-03-01 LAB — VITAMIN D 25 HYDROXY (VIT D DEFICIENCY, FRACTURES): VITD: 33.37 ng/mL (ref 30.00–100.00)

## 2021-03-01 LAB — VITAMIN B12: Vitamin B-12: 1232 pg/mL — ABNORMAL HIGH (ref 211–911)

## 2021-03-01 MED ORDER — MIRTAZAPINE 15 MG PO TABS: 15.0000 mg | ORAL_TABLET | Freq: Every day | ORAL | 0 refills | Status: DC

## 2021-03-01 MED ORDER — PANTOPRAZOLE SODIUM 40 MG PO TBEC: 40.0000 mg | DELAYED_RELEASE_TABLET | Freq: Every day | ORAL | 0 refills | Status: DC

## 2021-03-01 NOTE — Progress Notes (Signed)
Subjective:    Patient ID: [name], female    DOB: [birth year], 65 y.o.   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  She has a patient of another PCP in the office.  Is being seen today for fatigue and weight loss.  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  He reports that she does not have the drive to cook at home or go out and eat, she is sleeping throughout the night and will often sleep for 12 hours but continues to feel exhausted.  Taking Paxil 40 mg and does believe that this is helping but her depression "is a little bit more than it was in the past".  He has recently started taking a B12 supplement and also takes vitamin D 5000 units daily  Wt Readings from Last 3 Encounters:  03/01/21 114 lb (51.7 kg)  07/14/20 128 lb 8 oz (58.3 kg)  05/10/20 126 lb (57.2 kg)   She also reports that she continues to have epigastric pain related to GERD.  On omeprazole but does not feel as though it is working.  Review of Systems See HPI   Past Medical History:  Diagnosis Date  . Anxiety   . 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 Bronchitis   . Depression   . Dyspnea   . Emphysema, unspecified ([place])    per patient mild case  . Family history of adverse reaction to anesthesia    patient states sister has a hard time waking up from anesthesia.   . Hepatitis C   . lung ca dx'd 03/2018  . Tremor, unspecified    non-specific; takes inderal.     Social History   Socioeconomic History  . Marital status: Married    Spouse name: Not on file  . Number of children: Not on file  . Years of education: Not on file  . Highest education level: Not on file  Occupational History   . Not on file  Tobacco Use  . Smoking status: Former Smoker    Packs/day: 0.25    Types: Cigarettes    Quit date: 03/13/2018    Years since quitting: 2.9  . Smokeless tobacco: Never Used  . Tobacco comment: Enrolled in the 03/01 Quit Smoking Class Virtual  Vaping Use  . Vaping Use: Never used  Substance and Sexual Activity  . Alcohol use: Yes    Alcohol/week: 7.0 standard drinks    Types: 7 Shots of liquor per week    Comment: one margarita  every night  . Drug use: Not Currently    Frequency: 4.0 times per week    Types: Marijuana    Comment: occasional use. last use; 03/08/18  . Sexual activity: Never  Other Topics Concern  . Not on file  Social History Narrative  . Not on file   Social Determinants of Health   Financial Resource Strain: Not on file  Food Insecurity: Not on file  Transportation Needs: Not on file  Physical Activity: Not on file  Stress: Not on file  Social Connections: Not on file  Intimate Partner Violence: Not on file    Past Surgical History:  Procedure Laterality Date  . ABDOMINAL HYSTERECTOMY  1986   menorrhagia; hx of cryo for abnormal paps  . APPENDECTOMY     removed in 1986 per pt  . INTERCOSTAL NERVE BLOCK Right 12/18/2019   Procedure: Intercostal Nerve Block;  Surgeon: Melrose Nakayama, MD;  Location: [facility];  Service: Thoracic;  Laterality: Right;  . LOBECTOMY Left 03/13/2018   Procedure: Lingula section of left upper lobe lobectomy;  Surgeon: Melrose Nakayama, MD;  Location: [facility];  Service: Thoracic;  Laterality: Left;  . NODE DISSECTION  12/18/2019   Procedure: Node Dissection;  Surgeon: Melrose Nakayama, MD;  Location: [facility];  Service: Thoracic;;  . VIDEO ASSISTED THORACOSCOPY (VATS)/WEDGE RESECTION Left 03/13/2018   Procedure: VIDEO ASSISTED THORACOSCOPY (VATS)/WEDGE RESECTION;  Surgeon: Melrose Nakayama, MD;  Location: [facility] OR;  Service: Thoracic;  Laterality: Left;    Family History  Problem Relation Age of Onset   . Asthma Mother   . Alzheimer's disease Mother 43  . Hypertension Mother   . Other Father        shot   . Neuropathy Sister        face  . Cancer Maternal Aunt        breast  . Cancer Paternal Aunt   . Cancer Paternal Grandmother        abdominal  . Lung cancer Paternal Uncle     Allergies  Allergen Reactions  . Aspirin Shortness Of Breath  . Morphine Sulfate Other (See Comments)    RED STREAKS ON ARMS.  Marland Kitchen Oxycodone Itching and Rash  . Glycopyrrolate     UNSPECIFIED REACTION  pt unsure if she is allergic.  . Lactose Intolerance (Gi) Nausea And Vomiting    Milk only.Patient states that she can eat cheese.  Marland Kitchen Penicillins Other (See Comments)     Heartburn, upset stomach only & tolerated AMOXIL after this reaction Has patient had a PCN reaction causing immediate rash, facial/tongue/throat swelling, SOB or lightheadedness with hypotension: No Has patient had a PCN reaction causing severe rash involving mucus membranes or skin necrosis: No Has patient had a PCN reaction that required hospitalization: No Has patient had a PCN reaction occurring within the last 10 years: #  #  #  YES  #  #  #   . Sulfa Antibiotics Tinitus    Current Outpatient Medications on File Prior to Visit  Medication Sig Dispense Refill  . albuterol (VENTOLIN HFA) 108 (90 Base) MCG/ACT inhaler Inhale 2 puffs into the lungs every 6 (six) hours as needed for shortness of breath. 54 g 3  . Fluticasone-Salmeterol (ADVAIR DISKUS) 250-50 MCG/DOSE AEPB Inhale 1 puff into the lungs 2 (two) times daily. 90 each 1  . guaiFENesin (MUCINEX) 600 MG 12 hr tablet Take 1 tablet (600 mg total) by mouth 2 (two) times daily as needed for cough or to loosen phlegm.    Marland Kitchen ibuprofen (ADVIL,MOTRIN) 200 MG tablet Take 400-600 mg by mouth every 6 (six) hours as needed for mild pain or moderate pain.     Marland Kitchen loratadine (CLARITIN) 10 MG tablet Take 10 mg by mouth daily.    . Melatonin 10 MG TABS Take 10 mg by mouth at bedtime.    Marland Kitchen  PARoxetine (PAXIL) 40 MG tablet Take 1 tablet by mouth once daily 90 tablet 1  . propranolol ER (INDERAL LA) 60 MG 24 hr capsule Take 1 capsule by mouth once daily 90 capsule 0  . SYMBICORT 160-4.5 MCG/ACT inhaler Inhale 2 puffs by mouth twice daily 11  g 5  . traZODone (DESYREL) 50 MG tablet TAKE 1/2 TO 1 (ONE-HALF TO ONE) TABLET BY MOUTH ONCE DAILY AT BEDTIME AS NEEDED FOR SLEEP 90 tablet 1  . fluticasone (FLONASE) 50 MCG/ACT nasal spray Place 1 spray into both nostrils 2 (two) times daily. 16 g 5  . omeprazole (PRILOSEC) 40 MG capsule Take 20 mg by mouth daily.  (Patient not taking: Reported on 03/01/2021)     No current facility-administered medications on file prior to visit.    Pulse 72   Temp 97.7 F (36.5 C) (Oral)   Ht 5\' 5"  (1.651 m)   Wt 114 lb (51.7 kg)   SpO2 93%   BMI 18.97 kg/m       Objective:   Physical Exam Vitals and nursing note reviewed.  Constitutional:      Appearance: Normal appearance. She is underweight.  Cardiovascular:     Rate and Rhythm: Normal rate and regular rhythm.     Pulses: Normal pulses.     Heart sounds: Normal heart sounds.  Pulmonary:     Effort: Pulmonary effort is normal.     Breath sounds: Normal breath sounds.  Abdominal:     General: Abdomen is flat. Bowel sounds are normal.     Palpations: Abdomen is soft.     Tenderness: There is abdominal tenderness in the epigastric area.  Musculoskeletal:        General: Normal range of motion.  Skin:    General: Skin is warm.     Capillary Refill: Capillary refill takes less than 2 seconds.  Neurological:     General: No focal deficit present.     Mental Status: She is alert and oriented to person, place, and time.  Psychiatric:        Mood and Affect: Mood normal.        Behavior: Behavior normal.        Thought Content: Thought content normal.        Judgment: Judgment normal.       Assessment & Plan:  1. Weight loss - Likely depression mediated. Will check labs since it has  been awhile  - Add Remeron to regimen  - Follow up with PCP in 30 days  - CBC with Differential/Platelet; Future - Comprehensive metabolic panel; Future - TSH; Future - Vitamin D, 25-hydroxy; Future - Vitamin B12; Future - Vitamin B12 - Vitamin D, 25-hydroxy - TSH - Comprehensive metabolic panel - CBC with Differential/Platelet  2. Vitamin D deficiency  - CBC with Differential/Platelet; Future - Comprehensive metabolic panel; Future - TSH; Future - Vitamin D, 25-hydroxy; Future - Vitamin B12; Future - Vitamin B12 - Vitamin D, 25-hydroxy - TSH - Comprehensive metabolic panel - CBC with Differential/Platelet  3. Vitamin B 12 deficiency  - CBC with Differential/Platelet; Future - Comprehensive metabolic panel; Future - TSH; Future - Vitamin D, 25-hydroxy; Future - Vitamin B12; Future - Vitamin B12 - Vitamin D, 25-hydroxy - TSH - Comprehensive metabolic panel - CBC with Differential/Platelet  4. Depression, major, single episode, moderate ([place]) - Will add Remeron. Follow up with PCP in 30 days  - Stop trazodone  - mirtazapine (REMERON) 15 MG tablet; Take 1 tablet (15 mg total) by mouth at bedtime.  Dispense: 30 tablet; Refill: 0  5. Gastroesophageal reflux disease with esophagitis without hemorrhage - D/c Prilosec  - pantoprazole (PROTONIX) 40 MG tablet; Take 1 tablet (40 mg total) by mouth daily.  Dispense: 90 tablet; Refill: 0   [name], NP

## 2021-03-02 LAB — COMPREHENSIVE METABOLIC PANEL
ALT: 12 U/L (ref 0–35)
AST: 18 U/L (ref 0–37)
Albumin: 4.4 g/dL (ref 3.5–5.2)
Alkaline Phosphatase: 84 U/L (ref 39–117)
BUN: 15 mg/dL (ref 6–23)
CO2: 21 mEq/L (ref 19–32)
Calcium: 9.9 mg/dL (ref 8.4–10.5)
Chloride: 104 mEq/L (ref 96–112)
Creatinine, Ser: 1.02 mg/dL (ref 0.40–1.20)
GFR: 57.73 mL/min — ABNORMAL LOW (ref >60.00)
Glucose, Bld: 98 mg/dL (ref 70–99)
Potassium: 4.8 mEq/L (ref 3.5–5.1)
Sodium: 143 mEq/L (ref 135–145)
Total Bilirubin: 0.5 mg/dL (ref 0.2–1.2)
Total Protein: 7.2 g/dL (ref 6.0–8.3)

## 2021-03-10 ENCOUNTER — Other Ambulatory Visit: Payer: Self-pay | Admitting: Family Medicine

## 2021-03-10 DIAGNOSIS — J410 Simple chronic bronchitis: Secondary | ICD-10-CM

## 2021-03-13 ENCOUNTER — Telehealth: Payer: Self-pay | Admitting: Family Medicine

## 2021-03-13 NOTE — Telephone Encounter (Signed)
Pt call and stated she need a refill on her albuterol (VENTOLIN HFA) 108 (90 Base) MCG/ACT inhaler sent to  [facility], [place] Phone:  [phone]  Fax:  [phone]

## 2021-03-13 NOTE — Telephone Encounter (Signed)
Refills previously sent by PCP.

## 2021-03-22 ENCOUNTER — Other Ambulatory Visit: Payer: Self-pay | Admitting: Family Medicine

## 2021-03-22 DIAGNOSIS — R251 Tremor, unspecified: Secondary | ICD-10-CM

## 2021-04-10 ENCOUNTER — Inpatient Hospital Stay: Payer: Medicare Other | Attending: Internal Medicine

## 2021-04-10 ENCOUNTER — Ambulatory Visit (HOSPITAL_COMMUNITY)
Admission: RE | Admit: 2021-04-10 | Discharge: 2021-04-10 | Disposition: A | Discharge status: Home / Self Care | Payer: Medicare Other | Source: Ambulatory Visit | Attending: Internal Medicine | Admitting: Internal Medicine

## 2021-04-10 ENCOUNTER — Other Ambulatory Visit: Payer: Self-pay

## 2021-04-10 DIAGNOSIS — Z885 Allergy status to narcotic agent status: Secondary | ICD-10-CM | POA: Insufficient documentation

## 2021-04-10 DIAGNOSIS — I700 Atherosclerosis of aorta: Secondary | ICD-10-CM | POA: Insufficient documentation

## 2021-04-10 DIAGNOSIS — Z882 Allergy status to sulfonamides status: Secondary | ICD-10-CM | POA: Insufficient documentation

## 2021-04-10 DIAGNOSIS — C3490 Malignant neoplasm of unspecified part of unspecified bronchus or lung: Secondary | ICD-10-CM

## 2021-04-10 DIAGNOSIS — Z886 Allergy status to analgesic agent status: Secondary | ICD-10-CM | POA: Insufficient documentation

## 2021-04-10 DIAGNOSIS — F32A Depression, unspecified: Secondary | ICD-10-CM | POA: Insufficient documentation

## 2021-04-10 DIAGNOSIS — Z880 Allergy status to penicillin: Secondary | ICD-10-CM | POA: Insufficient documentation

## 2021-04-10 DIAGNOSIS — C3432 Malignant neoplasm of lower lobe, left bronchus or lung: Secondary | ICD-10-CM | POA: Insufficient documentation

## 2021-04-10 DIAGNOSIS — Z79899 Other long term (current) drug therapy: Secondary | ICD-10-CM | POA: Insufficient documentation

## 2021-04-10 DIAGNOSIS — E875 Hyperkalemia: Secondary | ICD-10-CM | POA: Insufficient documentation

## 2021-04-10 DIAGNOSIS — J432 Centrilobular emphysema: Secondary | ICD-10-CM | POA: Insufficient documentation

## 2021-04-10 DIAGNOSIS — Z9049 Acquired absence of other specified parts of digestive tract: Secondary | ICD-10-CM | POA: Insufficient documentation

## 2021-04-10 LAB — CBC WITH DIFFERENTIAL (CANCER CENTER ONLY)
Abs Immature Granulocytes: 0.03 10*3/uL (ref 0.00–0.07)
Basophils Absolute: 0.1 10*3/uL (ref 0.0–0.1)
Basophils Relative: 1 %
Eosinophils Absolute: 0.3 10*3/uL (ref 0.0–0.5)
Eosinophils Relative: 4 %
HCT: 44.1 % (ref 36.0–46.0)
Hemoglobin: 14.6 g/dL (ref 12.0–15.0)
Immature Granulocytes: 1 %
Lymphocytes Relative: 30 %
Lymphs Abs: 1.9 10*3/uL (ref 0.7–4.0)
MCH: 29.8 pg (ref 26.0–34.0)
MCHC: 33.1 g/dL (ref 30.0–36.0)
MCV: 90 fL (ref 80.0–100.0)
Monocytes Absolute: 0.8 10*3/uL (ref 0.1–1.0)
Monocytes Relative: 13 %
Neutro Abs: 3.4 10*3/uL (ref 1.7–7.7)
Neutrophils Relative %: 51 %
Platelet Count: 292 10*3/uL (ref 150–400)
RBC: 4.9 MIL/uL (ref 3.87–5.11)
RDW: 13.2 % (ref 11.5–15.5)
WBC Count: 6.4 10*3/uL (ref 4.0–10.5)
nRBC: 0 % (ref 0.0–0.2)

## 2021-04-10 LAB — CMP (CANCER CENTER ONLY)
ALT: 60 U/L — ABNORMAL HIGH (ref 0–44)
AST: 68 U/L — ABNORMAL HIGH (ref 15–41)
Albumin: 3.7 g/dL (ref 3.5–5.0)
Alkaline Phosphatase: 156 U/L — ABNORMAL HIGH (ref 38–126)
Anion gap: 8 (ref 5–15)
BUN: 18 mg/dL (ref 8–23)
CO2: 27 mmol/L (ref 22–32)
Calcium: 9.9 mg/dL (ref 8.9–10.3)
Chloride: 106 mmol/L (ref 98–111)
Creatinine: 0.83 mg/dL (ref 0.44–1.00)
GFR, Estimated: >60 mL/min (ref >60)
Glucose, Bld: 89 mg/dL (ref 70–99)
Potassium: 5.2 mmol/L — ABNORMAL HIGH (ref 3.5–5.1)
Sodium: 141 mmol/L (ref 135–145)
Total Bilirubin: 0.5 mg/dL (ref 0.3–1.2)
Total Protein: 7.8 g/dL (ref 6.5–8.1)

## 2021-04-10 MED ORDER — SODIUM CHLORIDE (PF) 0.9 % IJ SOLN
INTRAMUSCULAR | Status: AC
  Filled 2021-04-10: qty 50

## 2021-04-10 MED ORDER — IOHEXOL 300 MG/ML  SOLN
75.0000 mL | Freq: Once | INTRAMUSCULAR | Status: AC | PRN
  Administered 2021-04-10: 75 mL via INTRAVENOUS

## 2021-04-12 ENCOUNTER — Encounter: Payer: Self-pay | Admitting: Internal Medicine

## 2021-04-12 ENCOUNTER — Inpatient Hospital Stay (HOSPITAL_BASED_OUTPATIENT_CLINIC_OR_DEPARTMENT_OTHER): Payer: Medicare Other | Admitting: Internal Medicine

## 2021-04-12 ENCOUNTER — Other Ambulatory Visit: Payer: Self-pay

## 2021-04-12 VITALS — BP 142/79 | HR 95 | Temp 97.3°F | Resp 17 | Ht 65.0 in | Wt 122.6 lb

## 2021-04-12 DIAGNOSIS — Z79899 Other long term (current) drug therapy: Secondary | ICD-10-CM | POA: Diagnosis not present

## 2021-04-12 DIAGNOSIS — C3490 Malignant neoplasm of unspecified part of unspecified bronchus or lung: Secondary | ICD-10-CM | POA: Diagnosis not present

## 2021-04-12 DIAGNOSIS — Z880 Allergy status to penicillin: Secondary | ICD-10-CM | POA: Diagnosis not present

## 2021-04-12 DIAGNOSIS — J432 Centrilobular emphysema: Secondary | ICD-10-CM | POA: Diagnosis not present

## 2021-04-12 DIAGNOSIS — Z885 Allergy status to narcotic agent status: Secondary | ICD-10-CM | POA: Diagnosis not present

## 2021-04-12 DIAGNOSIS — Z882 Allergy status to sulfonamides status: Secondary | ICD-10-CM | POA: Diagnosis not present

## 2021-04-12 DIAGNOSIS — C3491 Malignant neoplasm of unspecified part of right bronchus or lung: Secondary | ICD-10-CM | POA: Diagnosis not present

## 2021-04-12 DIAGNOSIS — F32A Depression, unspecified: Secondary | ICD-10-CM | POA: Diagnosis not present

## 2021-04-12 DIAGNOSIS — C3432 Malignant neoplasm of lower lobe, left bronchus or lung: Secondary | ICD-10-CM | POA: Diagnosis not present

## 2021-04-12 DIAGNOSIS — I700 Atherosclerosis of aorta: Secondary | ICD-10-CM | POA: Diagnosis not present

## 2021-04-12 DIAGNOSIS — Z886 Allergy status to analgesic agent status: Secondary | ICD-10-CM | POA: Diagnosis not present

## 2021-04-12 DIAGNOSIS — E875 Hyperkalemia: Secondary | ICD-10-CM | POA: Diagnosis not present

## 2021-04-12 DIAGNOSIS — C3492 Malignant neoplasm of unspecified part of left bronchus or lung: Secondary | ICD-10-CM | POA: Diagnosis not present

## 2021-04-12 DIAGNOSIS — Z9049 Acquired absence of other specified parts of digestive tract: Secondary | ICD-10-CM | POA: Diagnosis not present

## 2021-04-12 NOTE — Addendum Note (Signed)
Addended by: Ardeen Garland on: 04/12/2021 12:49 PM   Modules accepted: Orders

## 2021-04-12 NOTE — Progress Notes (Signed)
[facility] [phone]   [phone]  OFFICE PROGRESS NOTE  [name], MD [place] [address]  DIAGNOSIS:  1) Stage Ib (T2, N0, M0) non-small cell lung cancer, adenocarcinoma diagnosed in June 2019. 2) stage Ia (T1 a, N0, M0) non-small cell lung cancer, adenocarcinoma status post   PRIOR THERAPY:  1) status post wedge resection of the left upper lobe nodule in addition to lingular segmentectomy and mediastinal lymph node sampling under the care of Dr. Roxan Hockey on March 13, 2018.  Right lower lobe segmentectomy under the care of Dr. Roxan Hockey on December 18, 2019.   CURRENT THERAPY: Observation.  INTERVAL HISTORY: [name] 65 y.o. female returns to the clinic today for follow-up visit.  The patient is feeling fine today with no concerning complaints.  She is a little bit stressed with her husband having multiple stroke and currently a resident of skilled nursing facility.  She denied having any current chest pain, shortness of breath, cough or hemoptysis.  She denied having any fever or chills.  She has no nausea, vomiting, diarrhea or constipation.  She has no headache or visual changes.  She has no fever or chills.  She had repeat CT scan of the chest performed recently and she is here for evaluation and discussion of her risk her results.  MEDICAL HISTORY: Past Medical History:  Diagnosis Date   Anxiety    Arthritis of hand 04/17/2017   Bronchitis    Depression    Dyspnea    Emphysema, unspecified ([place])    per patient mild case   Family history of adverse reaction to anesthesia    patient states sister has a hard time waking up from anesthesia.    Hepatitis C    lung ca dx'd 03/2018   Tremor, unspecified    non-specific; takes inderal.     ALLERGIES:  is allergic to aspirin, morphine sulfate, oxycodone, glycopyrrolate, lactose intolerance (gi), penicillins, and sulfa antibiotics.  MEDICATIONS:   Current Outpatient Medications  Medication Sig Dispense Refill   albuterol (VENTOLIN HFA) 108 (90 Base) MCG/ACT inhaler INHALE 2 PUFFS BY MOUTH EVERY 6 HOURS AS NEEDED FOR SHORTNESS OF BREATH 54 g 5   fluticasone (FLONASE) 50 MCG/ACT nasal spray Place 1 spray into both nostrils 2 (two) times daily. 16 g 5   Fluticasone-Salmeterol (ADVAIR DISKUS) 250-50 MCG/DOSE AEPB Inhale 1 puff into the lungs 2 (two) times daily. 90 each 1   guaiFENesin (MUCINEX) 600 MG 12 hr tablet Take 1 tablet (600 mg total) by mouth 2 (two) times daily as needed for cough or to loosen phlegm.     ibuprofen (ADVIL,MOTRIN) 200 MG tablet Take 400-600 mg by mouth every 6 (six) hours as needed for mild pain or moderate pain.      loratadine (CLARITIN) 10 MG tablet Take 10 mg by mouth daily.     Melatonin 10 MG TABS Take 10 mg by mouth at bedtime.     mirtazapine (REMERON) 15 MG tablet Take 1 tablet (15 mg total) by mouth at bedtime. 30 tablet 0   pantoprazole (PROTONIX) 40 MG tablet Take 1 tablet (40 mg total) by mouth daily. 90 tablet 0   PARoxetine (PAXIL) 40 MG tablet Take 1 tablet by mouth once daily 90 tablet 1   propranolol ER (INDERAL LA) 60 MG 24 hr capsule Take 1 capsule by mouth once daily 30 capsule 1   SYMBICORT 160-4.5 MCG/ACT inhaler Inhale 2 puffs  by mouth twice daily 11 g 5   traZODone (DESYREL) 50 MG tablet TAKE 1/2 TO 1 (ONE-HALF TO ONE) TABLET BY MOUTH ONCE DAILY AT BEDTIME AS NEEDED FOR SLEEP 90 tablet 1   No current facility-administered medications for this visit.    SURGICAL HISTORY:  Past Surgical History:  Procedure Laterality Date   ABDOMINAL HYSTERECTOMY  1986   menorrhagia; hx of cryo for abnormal paps   APPENDECTOMY     removed in 1986 per pt   INTERCOSTAL NERVE BLOCK Right 12/18/2019   Procedure: Intercostal Nerve Block;  Surgeon: Melrose Nakayama, MD;  Location: [facility];  Service: Thoracic;  Laterality: Right;   LOBECTOMY Left 03/13/2018   Procedure: Lingula section of left upper lobe  lobectomy;  Surgeon: Melrose Nakayama, MD;  Location: [facility];  Service: Thoracic;  Laterality: Left;   NODE DISSECTION  12/18/2019   Procedure: Node Dissection;  Surgeon: Melrose Nakayama, MD;  Location: [facility];  Service: Thoracic;;   VIDEO ASSISTED THORACOSCOPY (VATS)/WEDGE RESECTION Left 03/13/2018   Procedure: VIDEO ASSISTED THORACOSCOPY (VATS)/WEDGE RESECTION;  Surgeon: Melrose Nakayama, MD;  Location: [facility] OR;  Service: Thoracic;  Laterality: Left;    REVIEW OF SYSTEMS:  A comprehensive review of systems was negative.   PHYSICAL EXAMINATION: General appearance: alert, cooperative, and no distress Head: Normocephalic, without obvious abnormality, atraumatic Neck: no adenopathy, no JVD, supple, symmetrical, trachea midline, and thyroid not enlarged, symmetric, no tenderness/mass/nodules Lymph nodes: Cervical, supraclavicular, and axillary nodes normal. Resp: clear to auscultation bilaterally Back: symmetric, no curvature. ROM normal. No CVA tenderness. Cardio: regular rate and rhythm, S1, S2 normal, no murmur, click, rub or gallop GI: soft, non-tender; bowel sounds normal; no masses,  no organomegaly Extremities: extremities normal, atraumatic, no cyanosis or edema  ECOG PERFORMANCE STATUS: 0 - Asymptomatic  Blood pressure (!) 142/79, pulse 95, temperature (!) 97.3 F (36.3 C), temperature source Tympanic, resp. rate 17, height 5\' 5"  (1.651 m), weight 122 lb 9.6 oz (55.6 kg), SpO2 96 %.  LABORATORY DATA: Lab Results  Component Value Date   WBC 6.4 04/10/2021   HGB 14.6 04/10/2021   HCT 44.1 04/10/2021   MCV 90.0 04/10/2021   PLT 292 04/10/2021      Chemistry      Component Value Date/Time   NA 141 04/10/2021 0928   K 5.2 (H) 04/10/2021 0928   CL 106 04/10/2021 0928   CO2 27 04/10/2021 0928   BUN 18 04/10/2021 0928   CREATININE 0.83 04/10/2021 0928   CREATININE 0.92 07/14/2020 1059      Component Value Date/Time   CALCIUM 9.9 04/10/2021 0928   ALKPHOS 156 (H)  04/10/2021 0928   AST 68 (H) 04/10/2021 0928   ALT 60 (H) 04/10/2021 0928   ALT 20 10/10/2017 1653   BILITOT 0.5 04/10/2021 0928       RADIOGRAPHIC STUDIES: CT Chest W Contrast  Result Date: 04/10/2021 CLINICAL DATA:  Non-small cell lung cancer, restaging. EXAM: CT CHEST WITH CONTRAST TECHNIQUE: Multidetector CT imaging of the chest was performed during intravenous contrast administration. CONTRAST:  64mL OMNIPAQUE IOHEXOL 300 MG/ML  SOLN COMPARISON:  04/08/2020 and 11/07/2018 FINDINGS: Cardiovascular: The heart size appears within normal limits. Aortic atherosclerosis. No pericardial effusion. Mediastinum/Nodes: No enlarged mediastinal, hilar, or axillary lymph nodes. Thyroid gland, trachea, and esophagus demonstrate no significant findings. Previous high right paratracheal lymph node measures 5 mm, image 20/2. Formally 7 mm. Previous low right paratracheal lymph node measures 0.7 cm, image 59/2. Unchanged. Lungs/Pleura: No  pleural effusion. Centrilobular and paraseptal emphysema. Postoperative changes from prior resections within the left upper lobe. There is surrounding scarring and architectural distortion. The appearance is stable compared with the previous exam. Wedge resection within the lateral left lower lobe has also been performed with stable changes. Stable appearance of triangular-shaped area of soft tissue along the suture chain measuring 0.8 x 0.8 cm, image 103/5. Wedge resection from the right lower lobe is again noted with residual scarring and soft tissue along the suture chain, stable from previous exam. Lastly, medial right upper lobe wedge resection also appears stable. There is a non solid nodule within the right upper lobe measuring 7 mm, image 55/5. Stable from the previous exam. No new or progressive changes identified at this time. Upper Abdomen: No acute or suspicious findings. Musculoskeletal: No aggressive lytic or sclerotic bone lesions identified. IMPRESSION: 1. Stable CT  of the chest. No specific findings identified to suggest residual or recurrence of tumor or metastatic disease. 2. 7 mm non solid nodule within the right upper lobe is unchanged from the previous exam. This is been present since 11/07/2018. Continued attention on follow-up imaging is recommended. 3. Emphysema and aortic atherosclerosis. Aortic Atherosclerosis (ICD10-I70.0) and Emphysema (ICD10-J43.9). Electronically Signed   By: Kerby Moors M.D.   On: 04/10/2021 14:19     ASSESSMENT AND PLAN: This is a very pleasant 65 years old white female with history of stage Ib non-small cell lung cancer, adenocarcinoma status post left upper segmental resection in addition to lingulectomy with mediastinal lymph node dissection in 2019. The patient was also diagnosed in March 2021 with stage Ia non-small cell lung cancer, adenocarcinoma status post right lower lobe segmentectomy. The patient is currently on observation and she is feeling fine today with no concerning symptoms. She had repeat CT scan of the chest performed recently.  I personally and independently reviewed the scan and discussed the results with the patient today. Her scan showed no concerning findings for disease recurrence or metastasis. I recommended for her to continue on observation with repeat CT scan of the chest in 1 year. Regarding the hyperkalemia I advised the patient to decrease the potassium rich foods in her diet. She was advised to call immediately if she has any other concerning symptoms in the interval. The patient voices understanding of current disease status and treatment options and is in agreement with the current care plan.  All questions were answered. The patient knows to call the clinic with any problems, questions or concerns. We can certainly see the patient much sooner if necessary.  Disclaimer: This note was dictated with voice recognition software. Similar sounding words can inadvertently be transcribed and may  not be corrected upon review.

## 2021-05-03 ENCOUNTER — Telehealth (INDEPENDENT_AMBULATORY_CARE_PROVIDER_SITE_OTHER): Payer: Medicare Other | Admitting: Family Medicine

## 2021-05-03 ENCOUNTER — Encounter: Payer: Self-pay | Admitting: Family Medicine

## 2021-05-03 ENCOUNTER — Telehealth: Payer: Self-pay | Admitting: Family Medicine

## 2021-05-03 DIAGNOSIS — F321 Major depressive disorder, single episode, moderate: Secondary | ICD-10-CM

## 2021-05-03 DIAGNOSIS — J410 Simple chronic bronchitis: Secondary | ICD-10-CM

## 2021-05-03 DIAGNOSIS — K122 Cellulitis and abscess of mouth: Secondary | ICD-10-CM

## 2021-05-03 DIAGNOSIS — R059 Cough, unspecified: Secondary | ICD-10-CM | POA: Diagnosis not present

## 2021-05-03 DIAGNOSIS — R062 Wheezing: Secondary | ICD-10-CM | POA: Diagnosis not present

## 2021-05-03 DIAGNOSIS — R251 Tremor, unspecified: Secondary | ICD-10-CM

## 2021-05-03 MED ORDER — FLUTICASONE-SALMETEROL 500-50 MCG/ACT IN AEPB: 1.0000 | INHALATION_SPRAY | Freq: Two times a day (BID) | RESPIRATORY_TRACT | 5 refills | Status: DC

## 2021-05-03 MED ORDER — PROPRANOLOL HCL ER 60 MG PO CP24: 60.0000 mg | ORAL_CAPSULE | Freq: Every day | ORAL | 1 refills | Status: DC

## 2021-05-03 MED ORDER — PAROXETINE HCL 40 MG PO TABS: 40.0000 mg | ORAL_TABLET | Freq: Every day | ORAL | 1 refills | Status: DC

## 2021-05-03 MED ORDER — AMOXICILLIN-POT CLAVULANATE 875-125 MG PO TABS
1.0000 | ORAL_TABLET | Freq: Two times a day (BID) | ORAL | 0 refills | Status: DC
Start: 2021-05-03 — End: 2021-05-31

## 2021-05-03 MED ORDER — ALBUTEROL SULFATE HFA 108 (90 BASE) MCG/ACT IN AERS
2.0000 | INHALATION_SPRAY | Freq: Four times a day (QID) | RESPIRATORY_TRACT | 0 refills | Status: DC | PRN
Start: 2021-05-03 — End: 2024-05-05

## 2021-05-03 MED ORDER — PREDNISONE 20 MG PO TABS: 40.0000 mg | ORAL_TABLET | Freq: Every day | ORAL | 0 refills | Status: DC

## 2021-05-03 MED ORDER — ALBUTEROL SULFATE (2.5 MG/3ML) 0.083% IN NEBU: 2.5000 mg | INHALATION_SOLUTION | Freq: Four times a day (QID) | RESPIRATORY_TRACT | 1 refills | Status: DC | PRN

## 2021-05-03 NOTE — Telephone Encounter (Signed)
Spoke with Ivin Booty, the pharmacist and informed her of the message below.

## 2021-05-03 NOTE — Telephone Encounter (Signed)
She states she can take amoxicillin and has taken in past

## 2021-05-03 NOTE — Progress Notes (Signed)
Virtual Visit via Video Note  I connected with [name] on 05/03/21 at  1:30 PM EDT by a video enabled telemedicine application and verified that I am speaking with the correct person using two identifiers.  Location patient: home Location provider: [place], [place] [address] Persons participating in the virtual visit: patient, provider  I discussed the limitations of evaluation and management by telemedicine and the availability of in person appointments. The patient expressed understanding and agreed to proceed.   [name] DOB: 10/10/1955 Encounter date: 05/03/2021  This is a 65 y.o. female who presents with Chief Complaint  Patient presents with   Shortness of Breath    Patient complains of shortness of breath for years, using an Albuterol inhaler and Symbicort  and Wilexa inhaler that was written for her husband, stated she started using her husband's nebulizer one month ago and would like a prescription for this, states husband is in a nursing home and was recently diagnosed with pneumonia   Cough    Productive with clear sputum x4-5 days, states she has visited her husband in a nursing home and he has pneumonia    History of present illness:  Husband just tested positive again for pneumonia. She is supposed to go to dentist tomorrow because she piece of corn kernel stuck between tooth and gum.   Temp 98; O2 98, HR 93, BP 116/68. Has been out of propranolol for several days. Shakes are worse without this.   Needs paroxetine refill.   She has tested negative for covid a couple of weeks ago. Cough worse in last 2 weeks. No fever.   She loves nebulizer - does get relief with this when she uses it. Does open her up.   Allergies  Allergen Reactions   Aspirin Shortness Of Breath   Morphine Sulfate Other (See Comments)    RED STREAKS ON ARMS.   Oxycodone Itching and Rash   Glycopyrrolate     UNSPECIFIED REACTION  pt unsure  if she is allergic.   Lactose Intolerance (Gi) Nausea And Vomiting    Milk only.Patient states that she can eat cheese.   Penicillins Other (See Comments)     Heartburn, upset stomach only & tolerated AMOXIL after this reaction Has patient had a PCN reaction causing immediate rash, facial/tongue/throat swelling, SOB or lightheadedness with hypotension: No Has patient had a PCN reaction causing severe rash involving mucus membranes or skin necrosis: No Has patient had a PCN reaction that required hospitalization: No Has patient had a PCN reaction occurring within the last 10 years: #  #  #  YES  #  #  #    Sulfa Antibiotics Tinitus   Current Meds  Medication Sig   albuterol (VENTOLIN HFA) 108 (90 Base) MCG/ACT inhaler INHALE 2 PUFFS BY MOUTH EVERY 6 HOURS AS NEEDED FOR SHORTNESS OF BREATH   guaiFENesin (MUCINEX) 600 MG 12 hr tablet Take 1 tablet (600 mg total) by mouth 2 (two) times daily as needed for cough or to loosen phlegm.   loratadine (CLARITIN) 10 MG tablet Take 10 mg by mouth daily.   Melatonin 10 MG TABS Take 10 mg by mouth at bedtime.   pantoprazole (PROTONIX) 40 MG tablet Take 1 tablet (40 mg total) by mouth daily.   PARoxetine (PAXIL) 40 MG tablet Take 1 tablet by mouth once daily   propranolol ER (INDERAL LA) 60 MG 24 hr capsule Take 1 capsule by mouth once daily  SYMBICORT 160-4.5 MCG/ACT inhaler Inhale 2 puffs by mouth twice daily   traZODone (DESYREL) 50 MG tablet TAKE 1/2 TO 1 (ONE-HALF TO ONE) TABLET BY MOUTH ONCE DAILY AT BEDTIME AS NEEDED FOR SLEEP    Review of Systems  Constitutional:  Positive for fatigue. Negative for chills and fever.  HENT:  Negative for congestion.   Respiratory:  Positive for cough, shortness of breath and wheezing (resolves with albuterol neb). Negative for chest tightness.   Cardiovascular:  Negative for chest pain, palpitations and leg swelling.   Objective:  There were no vitals taken for this visit.      BP Readings from Last 3  Encounters:  04/12/21 (!) 142/79  07/14/20 100/72  05/10/20 (!) 113/58   Wt Readings from Last 3 Encounters:  04/12/21 122 lb 9.6 oz (55.6 kg)  03/01/21 114 lb (51.7 kg)  07/14/20 128 lb 8 oz (58.3 kg)    EXAM:  GENERAL: alert, oriented, appears well and in no acute distress  HEENT: atraumatic, conjunctiva clear, no obvious abnormalities on inspection of external nose and ears  NECK: normal movements of the head and neck  LUNGS: tremor in voice makes talking difficult; coughing spells frequently.   CV: no obvious cyanosis  MS: moves all visible extremities without noticeable abnormality  PSYCH/NEURO: pleasant and cooperative, no obvious depression or anxiety, speech and thought processing grossly intact   Assessment/Plan  1. Cough I am concerned about pneumonia/copd exacerbation. She does not have steroid inhaler. Wixela was cheaper for her, so she requests this. If too expensive we may be able to give breztri sample and then will have to work with her/insurance for covered med. Albuterol per neb easier for her right now. Also cheaper. This was refilled along with inhaler. Encouraged her to use spacer with inhaler. Prednisone burst to help with wheezing.  - albuterol (VENTOLIN HFA) 108 (90 Base) MCG/ACT inhaler; Inhale 2 puffs into the lungs every 6 (six) hours as needed for wheezing or shortness of breath.  Dispense: 8 g; Refill: 0 - amoxicillin-clavulanate (AUGMENTIN) 875-125 MG tablet; Take 1 tablet by mouth 2 (two) times daily.  Dispense: 20 tablet; Refill: 0  2. Tremor This is chronic and well controlled with propranolol. She needs refill.  - propranolol ER (INDERAL LA) 60 MG 24 hr capsule; Take 1 capsule (60 mg total) by mouth daily.  Dispense: 90 capsule; Refill: 1  3. Oral abscess She has dental appt tomorrow, but I am not sure they will see her with this ongoing cough. Will treat her cough with augmentin which has some oral coverage. Follow up with dentist.   4.  Wheezing Did encourage her to retest for covid at home and let me know if positive.   5. Depression, major, single episode, moderate ([place]) Has been fairly stable; she is adjusting to her ex husband being in care facility. Since she is still the main "go-to" it still creates some stress, but less on her that she is dealing with regularly.  - PARoxetine (PAXIL) 40 MG tablet; Take 1 tablet (40 mg total) by mouth daily.  Dispense: 90 tablet; Refill: 1   I discussed the assessment and treatment plan with the patient. The patient was provided an opportunity to ask questions and all were answered. The patient agreed with the plan and demonstrated an understanding of the instructions.   The patient was advised to call back or seek an in-person evaluation if the symptoms worsen or if the condition fails to improve as anticipated.  I provided 30 minutes of non-face-to-face time during this encounter.   Micheline Rough, MD

## 2021-05-03 NOTE — Telephone Encounter (Signed)
[facility] called to advise that PT is allergic to Pencillin according to her file and yet we called in amoxicillin-clavulanate (AUGMENTIN) 875-125 MG tablet. Please advise.

## 2021-05-05 ENCOUNTER — Telehealth: Payer: Self-pay | Admitting: *Deleted

## 2021-05-05 NOTE — Telephone Encounter (Signed)
Left a message for the patient to return my call.  

## 2021-05-05 NOTE — Telephone Encounter (Signed)
Patient called back, stated she was able to get the Rx for [facility] and agreed to call back if needed.

## 2021-05-05 NOTE — Telephone Encounter (Signed)
-----   Message from [name], MD sent at 05/04/2021  3:41 PM EDT ----- Was she able to get the wixela? If so great. If not, could we put breztri samples up front for her (she was asking if we could mail them but I don't know that we can?). 180mcg 2 puffs inhaled BID and needs 1 month supply to get her through to next visit. Remind her to let me know if any worsening of symptoms.

## 2021-05-08 ENCOUNTER — Other Ambulatory Visit: Payer: Self-pay | Admitting: Family Medicine

## 2021-05-08 DIAGNOSIS — R059 Cough, unspecified: Secondary | ICD-10-CM

## 2021-05-08 DIAGNOSIS — R062 Wheezing: Secondary | ICD-10-CM

## 2021-05-08 DIAGNOSIS — J410 Simple chronic bronchitis: Secondary | ICD-10-CM

## 2021-05-08 MED ORDER — ALBUTEROL SULFATE (2.5 MG/3ML) 0.083% IN NEBU: 2.5000 mg | INHALATION_SOLUTION | Freq: Four times a day (QID) | RESPIRATORY_TRACT | 1 refills | Status: DC | PRN

## 2021-05-08 NOTE — Telephone Encounter (Signed)
Spoke with the pharmacist [place] at [facility] and she stated she cannot take a verbal for this as the Rx has to resubmitted with a diagnosis listed as this is billed through Medicare.  Message sent to PCP.

## 2021-05-25 ENCOUNTER — Other Ambulatory Visit: Payer: Self-pay

## 2021-05-25 ENCOUNTER — Telehealth: Payer: Medicare Other | Admitting: Adult Health

## 2021-05-25 ENCOUNTER — Encounter (HOSPITAL_COMMUNITY): Payer: Self-pay

## 2021-05-25 ENCOUNTER — Emergency Department (HOSPITAL_COMMUNITY): Payer: Medicare Other

## 2021-05-25 ENCOUNTER — Emergency Department (HOSPITAL_COMMUNITY)
Admission: EM | Admit: 2021-05-25 | Discharge: 2021-05-25 | Disposition: A | Discharge status: Home / Self Care | Payer: Medicare Other | Attending: Emergency Medicine | Admitting: Emergency Medicine

## 2021-05-25 DIAGNOSIS — Z87891 Personal history of nicotine dependence: Secondary | ICD-10-CM | POA: Insufficient documentation

## 2021-05-25 DIAGNOSIS — R0602 Shortness of breath: Secondary | ICD-10-CM | POA: Diagnosis present

## 2021-05-25 DIAGNOSIS — Z7951 Long term (current) use of inhaled steroids: Secondary | ICD-10-CM | POA: Insufficient documentation

## 2021-05-25 DIAGNOSIS — Z79899 Other long term (current) drug therapy: Secondary | ICD-10-CM | POA: Insufficient documentation

## 2021-05-25 DIAGNOSIS — Z20822 Contact with and (suspected) exposure to covid-19: Secondary | ICD-10-CM | POA: Insufficient documentation

## 2021-05-25 DIAGNOSIS — Z85118 Personal history of other malignant neoplasm of bronchus and lung: Secondary | ICD-10-CM | POA: Diagnosis not present

## 2021-05-25 DIAGNOSIS — J069 Acute upper respiratory infection, unspecified: Secondary | ICD-10-CM

## 2021-05-25 DIAGNOSIS — J449 Chronic obstructive pulmonary disease, unspecified: Secondary | ICD-10-CM | POA: Insufficient documentation

## 2021-05-25 DIAGNOSIS — D72829 Elevated white blood cell count, unspecified: Secondary | ICD-10-CM | POA: Diagnosis not present

## 2021-05-25 LAB — CBC WITH DIFFERENTIAL/PLATELET
Abs Immature Granulocytes: 0.06 10*3/uL (ref 0.00–0.07)
Basophils Absolute: 0.1 10*3/uL (ref 0.0–0.1)
Basophils Relative: 0 %
Eosinophils Absolute: 0.1 10*3/uL (ref 0.0–0.5)
Eosinophils Relative: 1 %
HCT: 43.4 % (ref 36.0–46.0)
Hemoglobin: 14.6 g/dL (ref 12.0–15.0)
Immature Granulocytes: 1 %
Lymphocytes Relative: 9 %
Lymphs Abs: 1 10*3/uL (ref 0.7–4.0)
MCH: 30.6 pg (ref 26.0–34.0)
MCHC: 33.6 g/dL (ref 30.0–36.0)
MCV: 91 fL (ref 80.0–100.0)
Monocytes Absolute: 1.1 10*3/uL — ABNORMAL HIGH (ref 0.1–1.0)
Monocytes Relative: 10 %
Neutro Abs: 9.1 10*3/uL — ABNORMAL HIGH (ref 1.7–7.7)
Neutrophils Relative %: 79 %
Platelets: 349 10*3/uL (ref 150–400)
RBC: 4.77 MIL/uL (ref 3.87–5.11)
RDW: 12.2 % (ref 11.5–15.5)
WBC: 11.4 10*3/uL — ABNORMAL HIGH (ref 4.0–10.5)
nRBC: 0 % (ref 0.0–0.2)

## 2021-05-25 LAB — COMPREHENSIVE METABOLIC PANEL
ALT: 11 U/L (ref 0–44)
AST: 20 U/L (ref 15–41)
Albumin: 3.6 g/dL (ref 3.5–5.0)
Alkaline Phosphatase: 106 U/L (ref 38–126)
Anion gap: 12 (ref 5–15)
BUN: 15 mg/dL (ref 8–23)
CO2: 24 mmol/L (ref 22–32)
Calcium: 9.3 mg/dL (ref 8.9–10.3)
Chloride: 101 mmol/L (ref 98–111)
Creatinine, Ser: 0.84 mg/dL (ref 0.44–1.00)
GFR, Estimated: >60 mL/min (ref >60)
Glucose, Bld: 105 mg/dL — ABNORMAL HIGH (ref 70–99)
Potassium: 3.9 mmol/L (ref 3.5–5.1)
Sodium: 137 mmol/L (ref 135–145)
Total Bilirubin: 1.6 mg/dL — ABNORMAL HIGH (ref 0.3–1.2)
Total Protein: 8 g/dL (ref 6.5–8.1)

## 2021-05-25 LAB — RESP PANEL BY RT-PCR (FLU A&B, COVID) ARPGX2
Influenza A by PCR: NEGATIVE
Influenza B by PCR: NEGATIVE
SARS Coronavirus 2 by RT PCR: NEGATIVE

## 2021-05-25 LAB — TROPONIN I (HIGH SENSITIVITY)
Troponin I (High Sensitivity): 7 ng/L (ref <18)
Troponin I (High Sensitivity): 31 ng/L — ABNORMAL HIGH (ref <18)

## 2021-05-25 LAB — BRAIN NATRIURETIC PEPTIDE: B Natriuretic Peptide: 134.1 pg/mL — ABNORMAL HIGH (ref 0.0–100.0)

## 2021-05-25 MED ORDER — PREDNISONE 10 MG (21) PO TBPK: ORAL_TABLET | Freq: Every day | ORAL | 0 refills | Status: DC

## 2021-05-25 MED ORDER — BENZONATATE 100 MG PO CAPS
100.0000 mg | ORAL_CAPSULE | Freq: Once | ORAL | Status: AC
  Administered 2021-05-25: 100 mg via ORAL
  Filled 2021-05-25: qty 1

## 2021-05-25 MED ORDER — LIDOCAINE VISCOUS HCL 2 % MT SOLN
15.0000 mL | Freq: Once | OROMUCOSAL | Status: AC
  Administered 2021-05-25: 15 mL via OROMUCOSAL
  Filled 2021-05-25: qty 15

## 2021-05-25 MED ORDER — IPRATROPIUM-ALBUTEROL 0.5-2.5 (3) MG/3ML IN SOLN
3.0000 mL | Freq: Once | RESPIRATORY_TRACT | Status: AC
  Administered 2021-05-25: 3 mL via RESPIRATORY_TRACT
  Filled 2021-05-25: qty 3

## 2021-05-25 MED ORDER — BENZONATATE 100 MG PO CAPS: 100.0000 mg | ORAL_CAPSULE | Freq: Three times a day (TID) | ORAL | 0 refills | Status: DC

## 2021-05-25 MED ORDER — PREDNISONE 20 MG PO TABS
60.0000 mg | ORAL_TABLET | Freq: Once | ORAL | Status: AC
  Administered 2021-05-25: 60 mg via ORAL
  Filled 2021-05-25: qty 3

## 2021-05-25 NOTE — ED Notes (Signed)
Pt transported to xray 

## 2021-05-25 NOTE — ED Provider Notes (Signed)
[facility] EMERGENCY DEPARTMENT Provider Note   CSN: [id number] Arrival date & time: 05/25/21  1052     History Chief Complaint  Patient presents with   Shortness of Breath   Weakness    [name] is a 65 y.o. female with history of lung cancer status post partial lower lobe lobectomy bilaterally and COPD who presents to the emergency department for worsening shortness of breath of the last 2 weeks.  She states she was initially seen and evaluated for shortness of breath and was diagnosed with pneumonia and placed on augmentin.  She is initially stated some mild improvement but is since been progressively worsening.  Her shortness of breath is worse with exertion.  She reports associated productive cough with clear sputum and lightheadedness but denies fever, chills, chest pain, syncope, leg pain, leg swelling.  She does mention orthopnea which has been chronic since her lobectomy in 2018.  She does not require any additional oxygen requirements at baseline.  The history is provided by the patient. No language interpreter was used.  Shortness of Breath Associated symptoms: no abdominal pain, no chest pain, no cough, no fever, no rash and no vomiting   Weakness Associated symptoms: shortness of breath   Associated symptoms: no abdominal pain, no arthralgias, no chest pain, no cough, no fever, no nausea and no vomiting       Past Medical History:  Diagnosis Date   Anxiety    Arthritis of hand 04/17/2017   Bronchitis    Depression    Dyspnea    Emphysema, unspecified ([place])    per patient mild case   Family history of adverse reaction to anesthesia    patient states sister has a hard time waking up from anesthesia.    Hepatitis C    lung ca dx'd 03/2018   Tremor, unspecified    non-specific; takes inderal.     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   S/P partial lobectomy of lung  12/18/2019   Adenocarcinoma of left lung, stage 1 ([place]) 08/25/2019   Lung mass 03/13/2018   Chronic bronchitis ([place]) 02/04/2018   Left upper lobe pulmonary nodule 02/04/2018   Vaccine counseling 11/13/2017   Cirrhosis ([place]) 11/13/2017   Arthritis of hand 04/17/2017   Hepatitis C, chronic ([place]) 03/14/2015   Tobacco use disorder 01/21/2014   Tremor 10/14/2012   Migraine headache 08/01/2010   Depression, major, single episode, moderate ([place]) 03/19/2008   GERD 03/19/2008   NEPHROLITHIASIS, HX OF 03/19/2008    Past Surgical History:  Procedure Laterality Date   ABDOMINAL HYSTERECTOMY  1986   menorrhagia; hx of cryo for abnormal paps   APPENDECTOMY     removed in 1986 per pt   INTERCOSTAL NERVE BLOCK Right 12/18/2019   Procedure: Intercostal Nerve Block;  Surgeon: Melrose Nakayama, MD;  Location: [facility];  Service: Thoracic;  Laterality: Right;   LOBECTOMY Left 03/13/2018   Procedure: Lingula section of left upper lobe lobectomy;  Surgeon: Melrose Nakayama, MD;  Location: [facility];  Service: Thoracic;  Laterality: Left;   NODE DISSECTION  12/18/2019   Procedure: Node Dissection;  Surgeon: Melrose Nakayama, MD;  Location: [facility];  Service: Thoracic;;   VIDEO ASSISTED THORACOSCOPY (VATS)/WEDGE RESECTION Left 03/13/2018   Procedure: VIDEO ASSISTED THORACOSCOPY (VATS)/WEDGE RESECTION;  Surgeon: Melrose Nakayama, MD;  Location: [facility];  Service: Thoracic;  Laterality: Left;  OB History   No obstetric history on file.     Family History  Problem Relation Age of Onset   Asthma Mother    Alzheimer's disease Mother 63   Hypertension Mother    Other Father        shot    Neuropathy Sister        face   Cancer Maternal Aunt        breast   Cancer Paternal Aunt    Cancer Paternal Grandmother        abdominal   Lung cancer Paternal Uncle     Social History   Tobacco Use   Smoking status: Former    Packs/day: 0.25    Types: Cigarettes    Quit date: 03/13/2018     Years since quitting: 3.2   Smokeless tobacco: Never   Tobacco comments:    Enrolled in the 03/01 Quit Smoking Class Virtual  Vaping Use   Vaping Use: Never used  Substance Use Topics   Alcohol use: Yes    Alcohol/week: 7.0 standard drinks    Types: 7 Shots of liquor per week    Comment: one margarita  every night   Drug use: Not Currently    Frequency: 4.0 times per week    Types: Marijuana    Comment: occasional use. last use; 03/08/18    Home Medications Prior to Admission medications   Medication Sig Start Date End Date Taking? Authorizing Provider  albuterol (PROVENTIL) (2.5 MG/3ML) 0.083% nebulizer solution Take 3 mLs (2.5 mg total) by nebulization every 6 (six) hours as needed for wheezing or shortness of breath. 05/08/21   [name], MD  albuterol (VENTOLIN HFA) 108 (90 Base) MCG/ACT inhaler Inhale 2 puffs into the lungs every 6 (six) hours as needed for wheezing or shortness of breath. 05/03/21   [name], MD  amoxicillin-clavulanate (AUGMENTIN) 875-125 MG tablet Take 1 tablet by mouth 2 (two) times daily. 05/03/21   Koberlein, Steele Berg, MD  fluticasone-salmeterol (WIXELA INHUB) 500-50 MCG/ACT AEPB Inhale 1 puff into the lungs in the morning and at bedtime. 05/03/21   [name], MD  guaiFENesin (MUCINEX) 600 MG 12 hr tablet Take 1 tablet (600 mg total) by mouth 2 (two) times daily as needed for cough or to loosen phlegm. 03/18/18   Nani Skillern, PA-C  loratadine (CLARITIN) 10 MG tablet Take 10 mg by mouth daily.    [provider]  Melatonin 10 MG TABS Take 10 mg by mouth at bedtime.    [provider]  pantoprazole (PROTONIX) 40 MG tablet Take 1 tablet (40 mg total) by mouth daily. 03/01/21   Nafziger, Tommi Rumps, NP  PARoxetine (PAXIL) 40 MG tablet Take 1 tablet (40 mg total) by mouth daily. 05/03/21   [name], MD  predniSONE (DELTASONE) 20 MG tablet Take 2 tablets (40 mg total) by mouth daily with breakfast. 05/03/21   [name], MD  propranolol ER (INDERAL LA) 60 MG 24 hr capsule Take 1 capsule (60 mg total) by mouth daily. 05/03/21   Koberlein, Steele Berg, MD  traZODone (DESYREL) 50 MG tablet TAKE 1/2 TO 1 (ONE-HALF TO ONE) TABLET BY MOUTH ONCE DAILY AT BEDTIME AS NEEDED FOR SLEEP 11/16/20   [name], MD    Allergies    Aspirin, Morphine sulfate, Oxycodone, Glycopyrrolate, Lactose intolerance (gi), Penicillins, and Sulfa antibiotics  Review of Systems   Review of Systems  Constitutional:  Negative for chills and fever.  HENT:  Negative for congestion.   Respiratory:  Positive for shortness of breath. Negative for cough.   Cardiovascular:  Negative for chest pain, palpitations and leg swelling.  Gastrointestinal:  Negative for abdominal pain, nausea and vomiting.  Musculoskeletal:  Negative for arthralgias and back pain.  Skin:  Negative for color change and rash.  Neurological:  Negative for syncope.   Physical Exam Updated Vital Signs BP (!) 114/58 (BP Location: Left Arm)   Pulse 72   Temp 98.4 F (36.9 C) (Oral)   Resp 20   Ht 5\' 5"  (1.651 m)   Wt 52.2 kg   SpO2 95%   BMI 19.14 kg/m   Physical Exam Vitals reviewed.  Constitutional:      Appearance: She is well-developed.  HENT:     Head: Normocephalic and atraumatic.  Cardiovascular:     Rate and Rhythm: Normal rate and regular rhythm.     Pulses:          Dorsalis pedis pulses are 2+ on the right side.     Comments: Distant heart sounds.  Pulmonary:     Effort: Pulmonary effort is normal.     Comments: There is decreased air movement in all lung fields.  No obvious wheezing.  Mild coarse crackles at the bases.  Patient able to speak in full sentences. Abdominal:     Palpations: Abdomen is soft.     Tenderness: There is no abdominal tenderness.  Musculoskeletal:     Cervical back: Neck supple.  Skin:    General: Skin is warm and dry.  Neurological:     Mental Status: She is alert.  Psychiatric:        Mood and  Affect: Mood and affect normal.    ED Results / Procedures / Treatments   Labs (all labs ordered are listed, but only abnormal results are displayed) Labs Reviewed  RESP PANEL BY RT-PCR (FLU A&B, COVID) ARPGX2  CBC WITH DIFFERENTIAL/PLATELET  COMPREHENSIVE METABOLIC PANEL  BRAIN NATRIURETIC PEPTIDE  TROPONIN I (HIGH SENSITIVITY)    EKG EKG Interpretation  Date/Time:  Thursday May 25 2021 11:06:21 EDT Ventricular Rate:  72 PR Interval:  164 QRS Duration: 95 QT Interval:  413 QTC Calculation: 449 R Axis:   38 Text Interpretation: Sinus rhythm Consider left atrial enlargement Minimal ST elevation, inferior leads Confirmed by Madalyn Rob [phone]) on 05/25/2021 11:27:21 AM  Radiology DG Chest 2 View  Result Date: 05/25/2021 CLINICAL DATA:  Cough, shortness of breath, history of lung cancer and emphysema EXAM: CHEST - 2 VIEW COMPARISON:  Chest radiograph 05/10/2020 FINDINGS: The cardiomediastinal silhouette is stable. The lungs are hyperinflated and hyperlucent consistent with the history of emphysema. Blunting of the costophrenic angles likely reflects scarring and flattening of the diaphragms. There is no focal consolidation or pulmonary edema. There is no significant pleural effusion. There is no pneumothorax. Chain sutures projecting over the mid lung bilaterally are unchanged. There is multilevel degenerative change of the thoracic spine. There is no acute osseous abnormality. IMPRESSION: 1. No radiographic evidence of acute cardiopulmonary process. 2. COPD. Electronically Signed   By: Valetta Mole M.D.   On: 05/25/2021 12:10    Procedures Procedures   Medications Ordered in ED Medications  ipratropium-albuterol (DUONEB) 0.5-2.5 (3) MG/3ML nebulizer solution 3 mL (3 mLs Nebulization Given 05/25/21 1214)  benzonatate (TESSALON) capsule 100 mg (100 mg Oral Given 05/25/21 1342)  lidocaine (XYLOCAINE) 2 % viscous mouth solution 15 mL (15 mLs Mouth/Throat Given 05/25/21 1344)   predniSONE (  DELTASONE) tablet 60 mg (60 mg Oral Given 05/25/21 1342)    ED Course  I have reviewed the triage vital signs and the nursing notes.  Pertinent labs & imaging results that were available during my care of the patient were reviewed by me and considered in my medical decision making (see chart for details).    MDM Rules/Calculators/A&P                           Leesa Leifheit is a 65 year old female with history of lung cancer status post partial bilateral lower lobectomy and COPD who presents to the emergency department for increasing shortness of breath.  Clinically she has had a dry cough the entirety of her visit today.  She is afebrile and not tachycardic.  She was hypotensive but I believe that this is her baseline.  She does not have any lightheadedness or dizziness.   CBC showed mild leukocytosis.  BNP is 134.  Clinically she does not appear volume overloaded.  There is no evidence of pedal edema or rales.  Troponin was negative.  CMP was grossly normal.  COVID was negative. Chest x-ray revealed no signs of pneumonia, pneumothorax, or cardiomegaly.  Attending evaluated patient at bedside. Agrees with plan.  Clinical picture, I believe this is likely a viral upper respiratory infection. Will give her a longer tapered steroid dose as she felt the short course she received a couple weeks ago was ineffective. She is currently oxygenating well at 98% on room air.  Somewhat improved with duo nebs. Will also provide Tessalon Perles for cough.  She has albuterol at home that she can use as needed for symptom management.  She is hemodynamically stable and safe for discharge.  Strict return precautions given.  Will have her follow-up with her primary care doctor.  Final Clinical Impression(s) / ED Diagnoses Final diagnoses:  Viral upper respiratory tract infection    Rx / DC Orders ED Discharge Orders     None        Hendricks Limes, [place] 05/25/21 1456    Lucrezia Starch, MD 05/26/21 [phone]

## 2021-05-25 NOTE — Discharge Instructions (Addendum)
You are seen and evaluated in the department today for cough and shortness of breath.  As we discussed, there was no evidence of pneumonia on your chest x-ray.  Your oxygen levels have been good during your time in the department.  Will prescribe steroids and cough medicine.  Please schedule an appointment to follow-up with your primary care doctor within the next week.    Please return to the emergency department if you experience increasing shortness of breath, chest pain, fever, chills, or any other concerns.

## 2021-05-25 NOTE — ED Triage Notes (Signed)
Pt  was BIB EMS complaining of SOB after having difficulty getting to her kitchen and the bathroom. Pt was diagnosed with pneumonia about 2 weeks ago and has finished her prescribed albuterol but had no relief. EMS reported wheezing on the left side that was resolves by 5 albuterol and 0.5 Atrovent.   94% RA 140/70 98.0 F HR 74

## 2021-05-25 NOTE — ED Notes (Signed)
PTAR cancelled per nurse request at 16:55

## 2021-05-25 NOTE — ED Notes (Signed)
PTAR was called for PT

## 2021-05-31 ENCOUNTER — Encounter: Payer: Self-pay | Admitting: Family Medicine

## 2021-05-31 ENCOUNTER — Ambulatory Visit (INDEPENDENT_AMBULATORY_CARE_PROVIDER_SITE_OTHER): Payer: Medicare Other | Admitting: Family Medicine

## 2021-05-31 ENCOUNTER — Other Ambulatory Visit: Payer: Self-pay

## 2021-05-31 DIAGNOSIS — J441 Chronic obstructive pulmonary disease with (acute) exacerbation: Secondary | ICD-10-CM

## 2021-05-31 DIAGNOSIS — R251 Tremor, unspecified: Secondary | ICD-10-CM | POA: Diagnosis not present

## 2021-05-31 MED ORDER — PREDNISONE 20 MG PO TABS: ORAL_TABLET | ORAL | 0 refills | Status: DC

## 2021-05-31 MED ORDER — IPRATROPIUM BROMIDE 0.02 % IN SOLN: 0.5000 mg | Freq: Four times a day (QID) | RESPIRATORY_TRACT | 12 refills | Status: DC

## 2021-05-31 MED ORDER — PROPRANOLOL HCL ER 60 MG PO CP24: 60.0000 mg | ORAL_CAPSULE | Freq: Every day | ORAL | 1 refills | Status: DC

## 2021-05-31 NOTE — Patient Instructions (Signed)
Start the breztri inhaler (samples given) - this will be 2 puffs twice daily. Use this with your spacing chamber.   You can stop the spiriva while you are using the breztri.   You can use the albuterol and atrovent (ipratropium) vials in your nebulizer together - take them 3 times daily to help with breathing. As breathing improves, you can leave the atrovent out of the nebulizer treatments because the breztri inhaler also has this medication.   Take the prednisone with food as directed.   If you are not noting improvement with the above meds in the next 48 hours LET ME KNOW! I would like to see you in person in the next couple of weeks to make sure you are moving in the right direction.

## 2021-05-31 NOTE — Progress Notes (Signed)
Virtual Visit via Video Note  I connected with [name] on 05/31/21 at  2:00 PM EDT  by a video enabled telemedicine application and verified that I am speaking with the correct person using two identifiers.  Location patient: home Location provider: [place], [place] [address] Persons participating in the virtual visit: patient, provider  I discussed the limitations of evaluation and management by telemedicine and the availability of in person appointments. The patient expressed understanding and agreed to proceed.   [name] DOB: [birth year] Encounter date: 05/31/2021  This is a 65 y.o. female who presents with Chief Complaint  Patient presents with   Follow-up    Patient stated she was seen in the ER, diagnosed with pneumonia, complains of recurrent cough with clear-green sputum intermittently, feeling better today   Vaginal Discharge    Patient complains of odorous vaginal discharge x2 weeks, questioned if related to Augmentin or toliet paper she uses    History of present illness: Was in ER on 8/25 for increased SOB, weakness. Given longer steroid tapered dose. She completed this yesterday or today.   She called ambulance for herself when she couldn't get in much air. Her O2% was staying at 88%. Any breath in or out triggered cough.   She states that cough is better. Sent home with cough medication - didn't help. She bought delsym. O2 wasn't coming up higher than 88% and losing strength - hard to walk and hard to keep balance. Took bourbon sip at bedtime to help her sleep.   Has been sleeping sitting up as long as she can. Using nebulizer with albuterol three times/day. Was using albuterol inhaler 4 puffs but has cut back to 2. Using spiriva BID as she has always done. Has been staying between 95 and 97% o2 rate, no fever. Can do a couple of things (like take care of animals).     Allergies  Allergen Reactions   Aspirin  Shortness Of Breath   Morphine Sulfate Other (See Comments)    RED STREAKS ON ARMS.   Oxycodone Itching and Rash   Glycopyrrolate     UNSPECIFIED REACTION  pt unsure if she is allergic.   Lactose Intolerance (Gi) Nausea And Vomiting    Milk only.Patient states that she can eat cheese.   Penicillins Other (See Comments)     Heartburn, upset stomach only & tolerated AMOXIL after this reaction Has patient had a PCN reaction causing immediate rash, facial/tongue/throat swelling, SOB or lightheadedness with hypotension: No Has patient had a PCN reaction causing severe rash involving mucus membranes or skin necrosis: No Has patient had a PCN reaction that required hospitalization: No Has patient had a PCN reaction occurring within the last 10 years: #  #  #  YES  #  #  #    Sulfa Antibiotics Tinitus   Current Meds  Medication Sig   albuterol (VENTOLIN HFA) 108 (90 Base) MCG/ACT inhaler Inhale 2 puffs into the lungs every 6 (six) hours as needed for wheezing or shortness of breath.   fluticasone-salmeterol (WIXELA INHUB) 500-50 MCG/ACT AEPB Inhale 1 puff into the lungs in the morning and at bedtime.   guaiFENesin (MUCINEX) 600 MG 12 hr tablet Take 1 tablet (600 mg total) by mouth 2 (two) times daily as needed for cough or to loosen phlegm.   loratadine (CLARITIN) 10 MG tablet Take 10 mg by mouth daily.   Melatonin 10 MG TABS Take 10 mg by  mouth at bedtime.   pantoprazole (PROTONIX) 40 MG tablet Take 1 tablet (40 mg total) by mouth daily.   PARoxetine (PAXIL) 40 MG tablet Take 1 tablet (40 mg total) by mouth daily.   predniSONE (DELTASONE) 20 MG tablet Take 2 tabs daily x 2 days, 1 tab daily x 2 days, 0.5 tab daily x 2 days   traZODone (DESYREL) 50 MG tablet TAKE 1/2 TO 1 (ONE-HALF TO ONE) TABLET BY MOUTH ONCE DAILY AT BEDTIME AS NEEDED FOR SLEEP (Patient taking differently: Take 25-50 mg by mouth at bedtime as needed for sleep.)   [DISCONTINUED] albuterol (PROVENTIL) (2.5 MG/3ML) 0.083%  nebulizer solution Take 3 mLs (2.5 mg total) by nebulization every 6 (six) hours as needed for wheezing or shortness of breath.   [DISCONTINUED] amoxicillin-clavulanate (AUGMENTIN) 875-125 MG tablet Take 1 tablet by mouth 2 (two) times daily.   [DISCONTINUED] benzonatate (TESSALON) 100 MG capsule Take 1 capsule (100 mg total) by mouth every 8 (eight) hours.   [DISCONTINUED] ipratropium (ATROVENT) 0.02 % nebulizer solution Take 2.5 mLs (0.5 mg total) by nebulization 4 (four) times daily.   [DISCONTINUED] predniSONE (DELTASONE) 20 MG tablet Take 2 tablets (40 mg total) by mouth daily with breakfast.   [DISCONTINUED] predniSONE (STERAPRED UNI-PAK 21 TAB) 10 MG (21) TBPK tablet Take by mouth daily. Take 6 tabs by mouth daily  for 1 day, then 5 tabs for 1 day, then 4 tabs for 1 day, then 3 tabs for 1 day, 2 tabs for 1 day, then 1 tab by mouth daily for 1 day   [DISCONTINUED] propranolol ER (INDERAL LA) 60 MG 24 hr capsule Take 1 capsule (60 mg total) by mouth daily.    Review of Systems  Constitutional:  Negative for chills, fatigue and fever.  Respiratory:  Positive for cough, shortness of breath and wheezing. Negative for chest tightness.   Cardiovascular:  Negative for chest pain, palpitations and leg swelling.   Objective:  There were no vitals taken for this visit.      BP Readings from Last 3 Encounters:  05/25/21 110/61  04/12/21 (!) 142/79  07/14/20 100/72   Wt Readings from Last 3 Encounters:  05/25/21 115 lb (52.2 kg)  04/12/21 122 lb 9.6 oz (55.6 kg)  03/01/21 114 lb (51.7 kg)     Physical Exam  VITALS per patient if applicable:  GENERAL: alert, oriented, appears well and in no acute distress  HEENT: atraumatic, conjunctiva clear, no obvious abnormalities on inspection of external nose and ears  NECK: normal movements of the head and neck  LUNGS: coughing frequently and spells over the phone; productive, spastic cough.   CV: no obvious cyanosis  MS: moves all visible  extremities without noticeable abnormality  PSYCH/NEURO: pleasant and cooperative, no obvious depression or anxiety, speech and thought processing grossly intact    Physical Exam Constitutional:      General: She is not in acute distress.    Appearance: She is well-developed. She is ill-appearing.  Cardiovascular:     Rate and Rhythm: Normal rate and regular rhythm.     Heart sounds: Normal heart sounds. No murmur heard.   No friction rub.  Pulmonary:     Effort: Pulmonary effort is normal. No respiratory distress.     Breath sounds: Normal breath sounds. No wheezing or rales.  Musculoskeletal:     Right lower leg: No edema.     Left lower leg: No edema.  Neurological:     Mental Status: She is alert and  oriented to person, place, and time.  Psychiatric:        Behavior: Behavior normal.    Assessment/Plan  1. COPD with exacerbation ([place]) She did improve with steroid; will continue taper for her. Cough is spastic and tight sounding. We are going to also add inhaled steroid- cost has been issue so we are going to give breztri samples and have her take 2 puffs twice daily. Adding atrovent to her neb treatments 3 times daily; can decrease as breathing improves and then drop the atrovent component. Hold spiriva now. Encouraged her to let me know if not seeing imrpovement in the next ouple of weeks.   2. Tremor - propranolol ER (INDERAL LA) 60 MG 24 hr capsule; Take 1 capsule (60 mg total) by mouth daily.  Dispense: 90 capsule; Refill: 1   Return in about 2 weeks (around 06/14/2021) for Chronic condition visit. 34  minutes total time spent with patient, charting, discussion of treatment plan. Samples put up front for her with AVS.    Micheline Rough, MD

## 2021-06-01 ENCOUNTER — Telehealth: Payer: Self-pay | Admitting: Family Medicine

## 2021-06-01 DIAGNOSIS — R059 Cough, unspecified: Secondary | ICD-10-CM

## 2021-06-01 DIAGNOSIS — R062 Wheezing: Secondary | ICD-10-CM

## 2021-06-01 DIAGNOSIS — J410 Simple chronic bronchitis: Secondary | ICD-10-CM

## 2021-06-01 MED ORDER — ALBUTEROL SULFATE (2.5 MG/3ML) 0.083% IN NEBU: 2.5000 mg | INHALATION_SOLUTION | Freq: Four times a day (QID) | RESPIRATORY_TRACT | 1 refills | Status: DC | PRN

## 2021-06-01 NOTE — Telephone Encounter (Signed)
PT called to advise that her Rx on file called to advise that the albuterol (PROVENTIL) (2.5 MG/3ML) 0.083% nebulizer solution needs a diag code to be faxed over for them to refill it. Please advise.

## 2021-06-01 NOTE — Telephone Encounter (Signed)
Rx re-sent via escribe with diagnoses.

## 2021-06-02 ENCOUNTER — Telehealth: Payer: Self-pay

## 2021-06-02 MED ORDER — IPRATROPIUM BROMIDE 0.02 % IN SOLN: 0.5000 mg | Freq: Four times a day (QID) | RESPIRATORY_TRACT | 12 refills | Status: DC

## 2021-06-02 NOTE — Telephone Encounter (Signed)
Patient called stating pharmacy will not fill Rx ipratropium (ATROVENT) 0.02 % nebulizer solution without a Dx code

## 2021-06-02 NOTE — Addendum Note (Signed)
Addended by: Agnes Lawrence on: 06/02/2021 01:27 PM   Modules accepted: Orders

## 2021-06-02 NOTE — Telephone Encounter (Signed)
Rx re-sent with diagnoses.

## 2021-06-02 NOTE — Telephone Encounter (Signed)
Rx done. 

## 2021-06-07 ENCOUNTER — Ambulatory Visit (INDEPENDENT_AMBULATORY_CARE_PROVIDER_SITE_OTHER): Payer: Medicare Other | Admitting: Family Medicine

## 2021-06-07 ENCOUNTER — Other Ambulatory Visit: Payer: Self-pay

## 2021-06-07 ENCOUNTER — Encounter: Payer: Self-pay | Admitting: Family Medicine

## 2021-06-07 VITALS — BP 130/80 | HR 70 | Temp 96.7°F | Ht 65.0 in | Wt 119.7 lb

## 2021-06-07 DIAGNOSIS — J441 Chronic obstructive pulmonary disease with (acute) exacerbation: Secondary | ICD-10-CM

## 2021-06-07 NOTE — Patient Instructions (Addendum)
Morning: atrovent and albuterol treatment, wixela 1 puff with spacing chamber. Afternoon: atrovent and albuterol neb together.   If needed you can use up to 4 of the albuterol/atrovent treatments daily. But if you are able to limit to 2-3, that is better.   In the evening if needed, can use albuterol/atrovent neb  Use second dose of wixela in the evening with spacing chamber.   At bedtime - can use one final albuterol/atrovent treatment.     IF you are feeling short of breath between the above treatments, then you can use either ventolin or albuterol neb alone (without the atrovent).    Let me know if breathing worsens at all once you stop the prednisone.   You will use that wixela every day twice daily going forward and then for now we will continue the atrovent/albuterol at least twice daily.

## 2021-06-07 NOTE — Progress Notes (Signed)
[name] DOB: [birth year] Encounter date: 06/07/2021  This is a 65 y.o. female who presents with Chief Complaint  Patient presents with   Follow-up    History of present illness: She is feeling a lot better. States 99% better than last time we talked. She is still tired, but energy is coming back.she is sitting up to sleep, which helps with cough. She is only getting 5-6 hours a night (still better than before).   She is using atrovent QID by itself, ventolin prn, albuterol bid with atrovent. Not used breztri, wixela.   No fevers, chills. O2 staying 95-97% at home. Breath is more full, wheezing better (gone now), chest more oprn. No nasal or sinus congestion. One more tab of prednisone left in taper.  Allergies  Allergen Reactions   Aspirin Shortness Of Breath   Morphine Sulfate Other (See Comments)    RED STREAKS ON ARMS.   Oxycodone Itching and Rash   Glycopyrrolate     UNSPECIFIED REACTION  pt unsure if she is allergic.   Lactose Intolerance (Gi) Nausea And Vomiting    Milk only.Patient states that she can eat cheese.   Penicillins Other (See Comments)     Heartburn, upset stomach only & tolerated AMOXIL after this reaction Has patient had a PCN reaction causing immediate rash, facial/tongue/throat swelling, SOB or lightheadedness with hypotension: No Has patient had a PCN reaction causing severe rash involving mucus membranes or skin necrosis: No Has patient had a PCN reaction that required hospitalization: No Has patient had a PCN reaction occurring within the last 10 years: #  #  #  YES  #  #  #    Sulfa Antibiotics Tinitus   Current Meds  Medication Sig   albuterol (PROVENTIL) (2.5 MG/3ML) 0.083% nebulizer solution Take 3 mLs (2.5 mg total) by nebulization every 6 (six) hours as needed for wheezing or shortness of breath.   albuterol (VENTOLIN HFA) 108 (90 Base) MCG/ACT inhaler Inhale 2 puffs into the lungs every 6 (six) hours as needed for wheezing or shortness of  breath.   fluticasone-salmeterol (WIXELA INHUB) 500-50 MCG/ACT AEPB Inhale 1 puff into the lungs in the morning and at bedtime.   guaiFENesin (MUCINEX) 600 MG 12 hr tablet Take 1 tablet (600 mg total) by mouth 2 (two) times daily as needed for cough or to loosen phlegm.   ipratropium (ATROVENT) 0.02 % nebulizer solution Take 2.5 mLs (0.5 mg total) by nebulization 4 (four) times daily.   loratadine (CLARITIN) 10 MG tablet Take 10 mg by mouth daily.   Melatonin 10 MG TABS Take 10 mg by mouth at bedtime.   pantoprazole (PROTONIX) 40 MG tablet Take 1 tablet (40 mg total) by mouth daily.   PARoxetine (PAXIL) 40 MG tablet Take 1 tablet (40 mg total) by mouth daily.   predniSONE (DELTASONE) 20 MG tablet Take 2 tabs daily x 2 days, 1 tab daily x 2 days, 0.5 tab daily x 2 days   propranolol ER (INDERAL LA) 60 MG 24 hr capsule Take 1 capsule (60 mg total) by mouth daily.   traZODone (DESYREL) 50 MG tablet TAKE 1/2 TO 1 (ONE-HALF TO ONE) TABLET BY MOUTH ONCE DAILY AT BEDTIME AS NEEDED FOR SLEEP (Patient taking differently: Take 25-50 mg by mouth at bedtime as needed for sleep.)    Review of Systems  Constitutional:  Positive for fatigue. Negative for chills and fever.  Respiratory:  Positive for cough and shortness of breath. Negative for chest tightness and wheezing.  Cardiovascular:  Negative for chest pain, palpitations and leg swelling.   Objective:  BP 130/80 (BP Location: Left Arm, Patient Position: Sitting, Cuff Size: Normal)   Pulse 70   Temp (!) 96.7 F (35.9 C) (Axillary)   Ht 5\' 5"  (1.651 m)   Wt 119 lb 11.2 oz (54.3 kg)   SpO2 91%   BMI 19.92 kg/m   Weight: 119 lb 11.2 oz (54.3 kg)   BP Readings from Last 3 Encounters:  06/07/21 130/80  05/25/21 110/61  04/12/21 (!) 142/79   Wt Readings from Last 3 Encounters:  06/07/21 119 lb 11.2 oz (54.3 kg)  05/25/21 115 lb (52.2 kg)  04/12/21 122 lb 9.6 oz (55.6 kg)    Physical Exam Constitutional:      General: She is not in acute  distress.    Appearance: She is well-developed.  Cardiovascular:     Rate and Rhythm: Normal rate and regular rhythm.     Heart sounds: Normal heart sounds. No murmur heard.   No friction rub.  Pulmonary:     Effort: Pulmonary effort is normal. No respiratory distress.     Breath sounds: Decreased breath sounds (posterior) present. No wheezing or rales.  Musculoskeletal:     Right lower leg: No edema.     Left lower leg: No edema.  Neurological:     Mental Status: She is alert and oriented to person, place, and time.  Psychiatric:        Behavior: Behavior normal.    Assessment/Plan  1. COPD with exacerbation ([place]) She is doing much better. Needs to get back on to steroid inhaler/beta agonist. Wrote out neb/inhaler plan for her to follow. Let me know if any back slide once done with prednisone.   Morning: atrovent and albuterol treatment, wixela 1 puff with spacing chamber. Afternoon: atrovent and albuterol neb together.   If needed you can use up to 4 of the albuterol/atrovent treatments daily. But if you are able to limit to 2-3, that is better.   In the evening if needed, can use albuterol/atrovent neb  Use second dose of wixela in the evening with spacing chamber.   At bedtime - can use one final albuterol/atrovent treatment.     IF you are feeling short of breath between the above treatments, then you can use either ventolin or albuterol neb alone (without the atrovent).    Let me know if breathing worsens at all once you stop the prednisone.   You will use that wixela every day twice daily going forward and then for now we will continue the atrovent/albuterol at least twice daily.    Return in about 2 months (around 08/07/2021) for Chronic condition visit. 30 minutes spent with patient to review neb/inhaler treatment and signs for concern with breathing.   Micheline Rough, MD

## 2021-06-15 ENCOUNTER — Telehealth: Payer: Self-pay | Admitting: Family Medicine

## 2021-06-15 NOTE — Telephone Encounter (Signed)
PT called to advise that she is currently still experiencing here cough. She has finished her prednisone and the cough still exists. She also called to advise she now has pink and wants to know what Dr.Koberlein recommends to do.

## 2021-06-16 ENCOUNTER — Other Ambulatory Visit: Payer: Self-pay | Admitting: Family Medicine

## 2021-06-16 DIAGNOSIS — R059 Cough, unspecified: Secondary | ICD-10-CM

## 2021-06-16 DIAGNOSIS — J411 Mucopurulent chronic bronchitis: Secondary | ICD-10-CM

## 2021-06-16 MED ORDER — GENTAMICIN SULFATE 0.3 % OP SOLN: 2.0000 [drp] | Freq: Four times a day (QID) | OPHTHALMIC | 0 refills | Status: DC

## 2021-06-16 MED ORDER — PREDNISONE 20 MG PO TABS: ORAL_TABLET | ORAL | 0 refills | Status: DC

## 2021-06-16 NOTE — Telephone Encounter (Signed)
Spoke with the patient and informed her I am not aware of hours for the [place] location; however she can walk in to the [facility] office Monday-Friday from 8-5pm.

## 2021-06-16 NOTE — Telephone Encounter (Signed)
She has pink eye x 1 week. Red, goopy. No vision changes. Gentamicin sent.

## 2021-06-16 NOTE — Telephone Encounter (Signed)
Spoke with patient. Cough is slightly worse. We are going to restart prednisone as things worsened within a few days coming off this, stop wixela, start breztri 2 puffs BID with spacer, continue with atrovent/albuterol nebs (spaced from inhaler use). Get cxr. Ref pulm.    Mechele Claude - patient wondering if [facility] has walk in xray? If so can we re send cxr to them? If not just let her know please to go to [facility]. Thanks!

## 2021-06-16 NOTE — Telephone Encounter (Signed)
Patient called back to check the status of a return call

## 2021-06-19 ENCOUNTER — Ambulatory Visit (INDEPENDENT_AMBULATORY_CARE_PROVIDER_SITE_OTHER)
Admission: RE | Admit: 2021-06-19 | Discharge: 2021-06-19 | Disposition: A | Discharge status: Home / Self Care | Payer: Medicare Other | Source: Ambulatory Visit | Attending: Family Medicine | Admitting: Family Medicine

## 2021-06-19 ENCOUNTER — Encounter: Payer: Self-pay | Admitting: Family Medicine

## 2021-06-19 ENCOUNTER — Other Ambulatory Visit: Payer: Self-pay

## 2021-06-19 DIAGNOSIS — R059 Cough, unspecified: Secondary | ICD-10-CM

## 2021-06-20 ENCOUNTER — Other Ambulatory Visit: Payer: Self-pay | Admitting: Family Medicine

## 2021-06-20 ENCOUNTER — Telehealth: Payer: Self-pay | Admitting: *Deleted

## 2021-06-20 MED ORDER — FUROSEMIDE 20 MG PO TABS: 20.0000 mg | ORAL_TABLET | Freq: Every day | ORAL | 2 refills | Status: DC | PRN

## 2021-06-20 NOTE — Telephone Encounter (Signed)
Called pt to schedule her AWV she declined states she has other medical issues at the current time.

## 2021-06-29 ENCOUNTER — Other Ambulatory Visit: Payer: Self-pay

## 2021-06-29 ENCOUNTER — Encounter: Payer: Self-pay | Admitting: Internal Medicine

## 2021-06-29 ENCOUNTER — Ambulatory Visit (INDEPENDENT_AMBULATORY_CARE_PROVIDER_SITE_OTHER): Payer: Medicare Other | Admitting: Internal Medicine

## 2021-06-29 ENCOUNTER — Ambulatory Visit: Payer: Medicare Other

## 2021-06-29 VITALS — BP 120/60 | HR 77 | Temp 98.4°F | Ht 65.0 in | Wt 120.0 lb

## 2021-06-29 DIAGNOSIS — J449 Chronic obstructive pulmonary disease, unspecified: Secondary | ICD-10-CM | POA: Insufficient documentation

## 2021-06-29 DIAGNOSIS — Z23 Encounter for immunization: Secondary | ICD-10-CM | POA: Diagnosis not present

## 2021-06-29 HISTORY — DX: Chronic obstructive pulmonary disease, unspecified: J44.9

## 2021-06-29 MED ORDER — TRELEGY ELLIPTA 100-62.5-25 MCG/INH IN AEPB: 1.0000 | INHALATION_SPRAY | Freq: Every day | RESPIRATORY_TRACT | 0 refills | Status: DC

## 2021-06-29 NOTE — Progress Notes (Signed)
[name], female    DOB: [birth year],    MRN: [id number]   Brief patient profile:  64 yowf    quit smoking 2019 p started albuterol  inhalers around 2014 then added symbicort changed to wixella  due to cost referred to pulmonary clinic 06/29/2021 by Dr Ethlyn Gallery  who recently changed to breztri which seemed to helped even more.   PFTs 12/14/19 c/w GOLD 3 copd   04/12/21 oncology note DIAGNOSIS:  1) Stage Ib (T2, N0, M0) non-small cell lung cancer, adenocarcinoma diagnosed in June 2019. 2) stage Ia (T1 a, N0, M0) non-small cell lung cancer, adenocarcinoma status post   PRIOR THERAPY:  1) status post wedge resection of the left upper lobe nodule in addition to lingular segmentectomy and mediastinal lymph node sampling under the care of Dr. Roxan Hockey on March 13, 2018.  Right lower lobe segmentectomy under the care of Dr. Roxan Hockey on December 18, 2019.   CURRENT THERAPY: Observation  History of Present Illness  06/29/2021  Pulmonary/ 1st office eval/Sidney Kann  Chief Complaint  Patient presents with   Pulmonary Consult     Referred by Dr. Gwyneth Revels. Pt has hx of lung ca and she was told back in 2018 she has COPD. She gets winded just walking around her house. She had PNA approx 2 months.    Dyspnea:  one room to the next / shops on line  Cough: prednisone helped still some residual dry cough  Sleep: on side / electric bed 30 degrees  SABA use: one bid hfa/ neb once daily   No obvious day to day or daytime variability or assoc excess/ purulent sputum or mucus plugs or hemoptysis or cp or chest tightness, subjective wheeze or overt sinus or hb symptoms.   Sleeping as above  without nocturnal  or early am exacerbation  of respiratory  c/o's or need for noct saba. Also denies any obvious fluctuation of symptoms with weather or environmental changes or other aggravating or alleviating factors except as outlined above   No unusual exposure hx or h/o childhood pna/ asthma or knowledge of  premature birth.  Current Allergies, Complete Past Medical History, Past Surgical History, Family History, and Social History were reviewed in [employer] record.  ROS  The following are not active complaints unless bolded Hoarseness, sore throat= mild globus on DPI , dysphagia, dental problems, itching, sneezing,  nasal congestion or discharge of excess mucus or purulent secretions, ear ache,   fever, chills, sweats, unintended wt loss or wt gain, classically pleuritic or exertional cp,  orthopnea pnd or arm/hand swelling  or leg swelling, presyncope, palpitations, abdominal pain, anorexia, nausea, vomiting, diarrhea  or change in bowel habits or change in bladder habits, change in stools or change in urine, dysuria, hematuria,  rash, arthralgias, visual complaints, headache, numbness, weakness or ataxia or problems with walking or coordination,  change in mood or  memory.           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    Hepatitis C    lung ca dx'd 03/2018   Tremor, unspecified    non-specific; takes inderal.     Outpatient  Medications Prior to Visit  Medication Sig Dispense Refill   albuterol (PROVENTIL) (2.5 MG/3ML) 0.083% nebulizer solution Take 3 mLs (2.5 mg total) by nebulization every 6 (six) hours as needed for wheezing or shortness of breath. 150 mL 1   albuterol (VENTOLIN HFA) 108 (90 Base) MCG/ACT inhaler Inhale 2 puffs into the lungs every 6 (six) hours as needed for wheezing or shortness of breath. 8 g 0   fluticasone-salmeterol (WIXELA INHUB) 500-50 MCG/ACT AEPB Inhale 1 puff into the lungs in the morning and at bedtime. 60 each 5   Melatonin 10 MG TABS Take 10 mg by mouth at bedtime.      omeprazole (PRILOSEC) 20 MG capsule Take 20 mg by mouth daily.     PARoxetine (PAXIL) 40 MG tablet Take 1 tablet (40 mg total) by mouth daily. 90 tablet 1   propranolol ER (INDERAL LA) 60 MG 24 hr capsule Take 1 capsule (60 mg total) by mouth daily. 90 capsule 1   traZODone (DESYREL) 50 MG tablet TAKE 1/2 TO 1 (ONE-HALF TO ONE) TABLET BY MOUTH ONCE DAILY AT BEDTIME AS NEEDED FOR SLEEP (Patient taking differently: Take 25-50 mg by mouth at bedtime as needed for sleep.) 90 tablet 1   ipratropium (ATROVENT) 0.02 % nebulizer solution Take 2.5 mLs (0.5 mg total) by nebulization 4 (four) times daily. (Patient not taking: Reported on 06/29/2021) 75 mL 12   furosemide (LASIX) 20 MG tablet Take 1 tablet (20 mg total) by mouth daily as needed for fluid. 30 tablet 2   gentamicin (GARAMYCIN) 0.3 % ophthalmic solution Place 2 drops into both eyes 4 (four) times daily. 5 mL 0   guaiFENesin (MUCINEX) 600 MG 12 hr tablet Take 1 tablet (600 mg total) by mouth 2 (two) times daily as needed for cough or to loosen phlegm.     loratadine (CLARITIN) 10 MG tablet Take 10 mg by mouth daily.     pantoprazole (PROTONIX) 40 MG tablet Take 1 tablet (40 mg total) by mouth daily. 90 tablet 0       0   No facility-administered medications prior to visit.     Objective:     BP 120/60 (BP Location: Left Arm, Cuff Size: Normal)   Pulse 77   Temp 98.4 F (36.9 C) (Oral)   Ht 5\' 5"  (1.651 m)   Wt 120 lb (54.4 kg)   SpO2 95% Comment: on RA  BMI 19.97 kg/m   SpO2: 95 % (on RA)  Pleasant amb thin wf  HEENT : pt wearing mask not removed for exam due to covid - 19 concerns.   NECK :  without JVD/Nodes/TM/ nl carotid upstrokes bilaterally   LUNGS: no acc muscle use,  Min barrel  contour chest wall with bilateral  slightly decreased bs s audible wheeze and  without cough on insp or exp maneuvers and min  Hyperresonant  to  percussion bilaterally     CV:  RRR  no s3 or murmur or increase in P2, and no edema   ABD:   soft and nontender with pos end  insp Hoover's  in the supine position. No bruits or organomegaly appreciated, bowel sounds nl  MS:   Nl gait/  ext warm without deformities, calf tenderness, cyanosis or clubbing No obvious joint restrictions   SKIN: warm and dry without lesions    NEURO:  alert, approp, nl sensorium with  no motor or cerebellar deficits apparent.        I personally reviewed images  and agree with radiology impression as follows:  CXR:  06/19/21  Hyperexpansion of the lungs.  Small bilateral pleural effusions.> diuresis given  Rec repeat cxr 06/29/2021 but did not go for cxr     Assessment   No problem-specific Assessment & Plan notes found for this encounter.     Christinia Gully, MD 06/29/2021

## 2021-06-29 NOTE — Patient Instructions (Addendum)
Plan A = Automatic = Always=    wixella one twice daily  = trelegy 100 once daily  or Breztri 2 puff every 12 hours   Breathe out thru nose   Plan B = Backup (to supplement plan A, not to replace it) Only use your albuterol inhaler as a rescue medication to be used if you can't catch your breath by resting or doing a relaxed purse lip breathing pattern.  - The less you use it, the better it will work when you need it. - Ok to use the inhaler up to 2 puffs  every 4 hours if you must but call for appointment if use goes up over your usual need - Don't leave home without it !!  (think of it like the spare tire for your car)   Plan C = Crisis (instead of Plan B but only if Plan B stops working) - only use your albuterol nebulizer if you first try Plan B and it fails to help > ok to use the nebulizer up to every 4 hours but if start needing it regularly call for immediate appointment   Call we the cheapest alternatives  for example wixellla / incruse   = trelegy   Please schedule a follow up office visit in 6 weeks, call sooner if needed

## 2021-06-30 ENCOUNTER — Encounter: Payer: Self-pay | Admitting: Internal Medicine

## 2021-06-30 NOTE — Assessment & Plan Note (Addendum)
Quit smoking 2019 - PFT's  31521  FEV1 1.02 (40 % ) ratio 0.39  p 20 % improvement from saba p ? prior to study with DLCO  10.06 (48%) corrects to 2.32 (55%)  for alv volume and FV curve classic concave exp loop - 06/29/2021  After extensive coaching inhaler device,  effectiveness =    90% with dpi, 75% with hfa   - 06/29/2021   Walked on RA x  3  lap(s) =  approx 750 @ mod fast pace, stopped due to end of study, sob  with lowest 02 sats 93%  DDX of  difficult airways management almost all start with A and  include Adherence, Ace Inhibitors, Acid Reflux, Active Sinus Disease, Alpha 1 Antitripsin deficiency, Anxiety masquerading as Airways dz,  ABPA,  Allergy(esp in young), Aspiration (esp in elderly), Adverse effects of meds,  Active smoking or vaping, A bunch of PE's (a small clot burden can't cause this syndrome unless there is already severe underlying pulm or vascular dz with poor reserve) plus two Bs  = Bronchiectasis and Beta blocker use..and one C= CHF   Adherence is always the initial "prime suspect" and is a multilayered concern that requires a "trust but verify" approach in every patient - starting with knowing how to use medications, especially inhalers, correctly, keeping up with refills and understanding the fundamental difference between maintenance and prns vs those medications only taken for a very short course and then stopped and not refilled.  - see inhaler teaching above - Let patient know there are no simple substitutes and that it is hard to get insurance to cover meds not on their formulary, which the patient has access to and we don't, and their insurance company is obligated to provide it to them.   Rec: Give enough samples until we can get pt in and work out a substitute.  On return patient should provide a with a list of alternatives provided by his/her insurance company.   ? Allergy / asthma component  Suggested by pft and response to prednisone so continue high dose ICS      ? Adverse effects of dpi > rec rinse and gargle and consider change back to [facility] if covered and can master technique  ? Alpha one def > check phenotype next ov   ? Beta blocker effects; In the setting of respiratory symptoms of unknown etiology,  It would be preferable to use bystolic, the most beta -1  selective Beta blocker available in sample form, with bisoprolol the most selective generic choice  on the market, at least on a trial basis, to make sure the spillover Beta 2 effects of the less specific Beta blockers are not contributing to this patient's symptoms > will defer any change BB for now to PCP  ? chf > note small bilateral pleural effusions with bnp during flare only 134  05/25/21    Note that ever small pleural effusions and copd have the same effect on insp muscles/mechanics (both shorten their length prior to inspiration making them weaker with less force reserve) so they are synergistic in causing sob.   Each maintenance medication was reviewed in detail including emphasizing most importantly the difference between maintenance and prns and under what circumstances the prns are to be triggered using an action plan format where appropriate.  Total time for H and P, chart review, counseling, reviewing hfa/dpi device(s) , directly observing portions of ambulatory 02 saturation study/ and generating customized AVS unique to this  office visit / same day charting =  51 min

## 2021-07-02 ENCOUNTER — Other Ambulatory Visit: Payer: Self-pay | Admitting: Family Medicine

## 2021-07-02 ENCOUNTER — Other Ambulatory Visit: Payer: Self-pay | Admitting: Adult Health

## 2021-07-02 DIAGNOSIS — G4700 Insomnia, unspecified: Secondary | ICD-10-CM

## 2021-07-02 DIAGNOSIS — K2100 Gastro-esophageal reflux disease with esophagitis, without bleeding: Secondary | ICD-10-CM

## 2021-08-06 IMAGING — CT NM PET TUM IMG RESTAG (PS) SKULL BASE T - THIGH
7 series · 25 of 25 positions shown · non-contrast
Comparison: Multiple exams, including chest CT from 11/23/2019
PET-CT from 02/19/2018

CLINICAL DATA: Subsequent treatment strategy for non-small cell
lung cancer.

EXAM:
NUCLEAR MEDICINE PET SKULL BASE TO THIGH
TECHNIQUE: 7.0 mCi F-18 FDG was injected intravenously. Full-ring PET imaging
was performed from the skull base to thigh after the radiotracer. CT
data was obtained and used for attenuation correction and anatomic
localization.
Fasting blood glucose: 105 mg/dl

[Series 2: ac ct sk_thigh 5.0 hd_fov · axial · 5.0mm · 1.52mm/px · z∈[-1018,-198]mm · 4 of 206 slices shown]
[im 1/206]
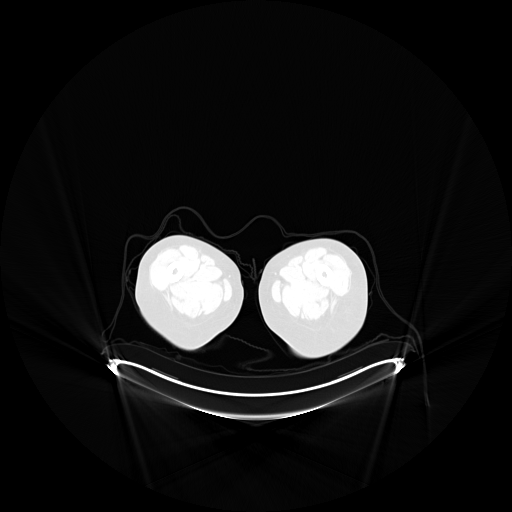
[im 69/206]
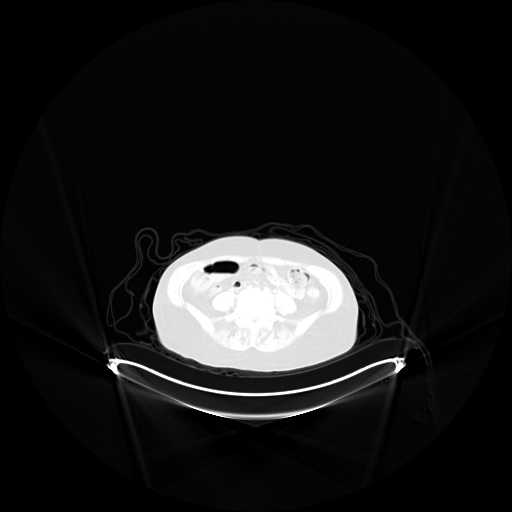
[im 137/206]
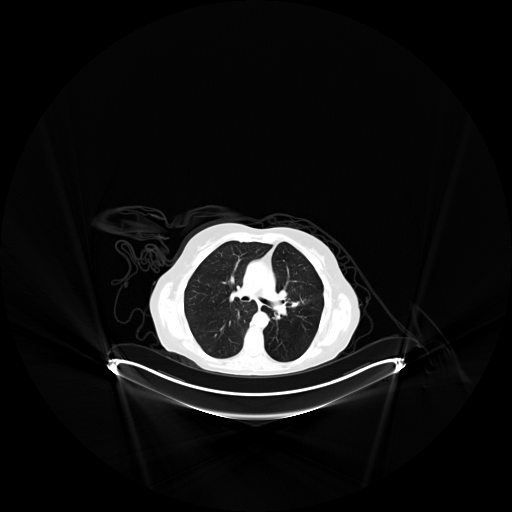
[im 206/206]
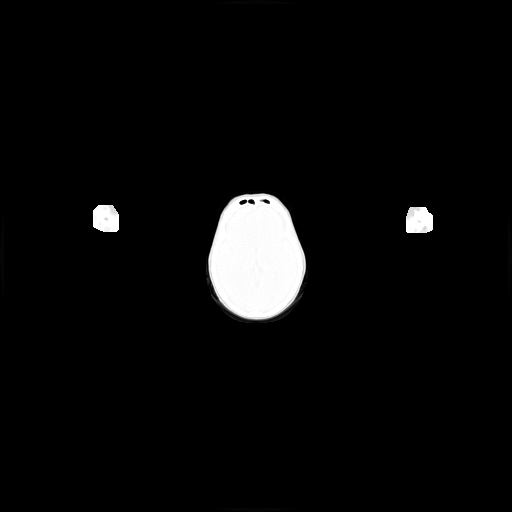

[Series 3: pet sk_thigh ac · axial · 5.0mm · 4.07mm/px · z∈[-1018,-198]mm · 5 of 206 slices shown]
[im 1/206]
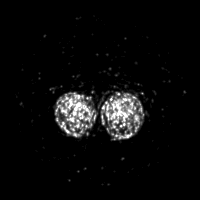
[im 52/206]
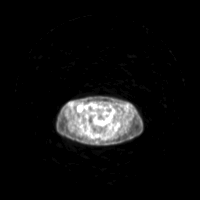
[im 103/206]
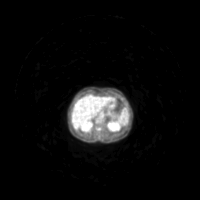
[im 154/206]
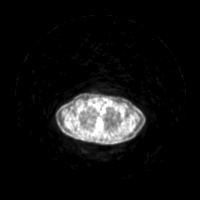
[im 206/206]
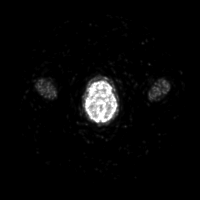

[Series 5: pet sk_thigh nac · axial · 5.0mm · 4.07mm/px · z∈[-1018,-198]mm · 5 of 206 slices shown]
[im 1/206]
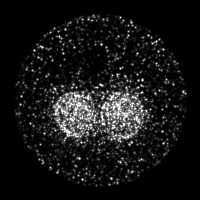
[im 52/206]
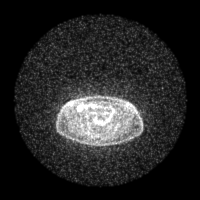
[im 103/206]
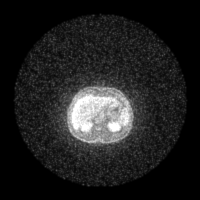
[im 154/206]
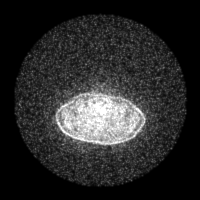
[im 206/206]
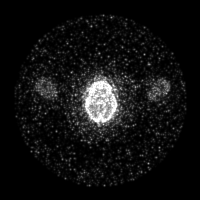

[Series 8: ct sk_thigh $2ct sk_thigh 3$lung_bone · axial · 5.0mm · 0.56mm/px · z∈[-600,-356]mm · 2 of 62 slices shown]
[im 1/62  bone]
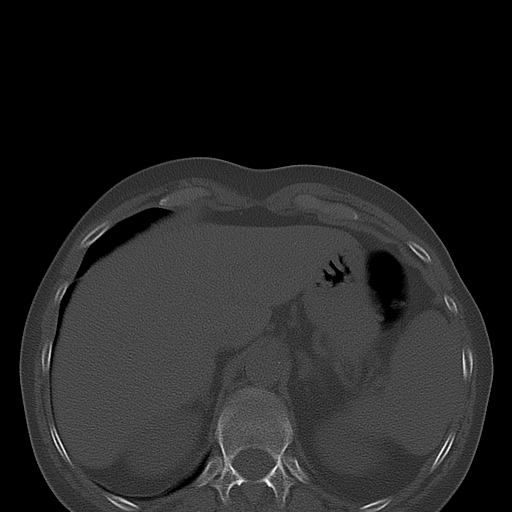
[im 62/62  bone]
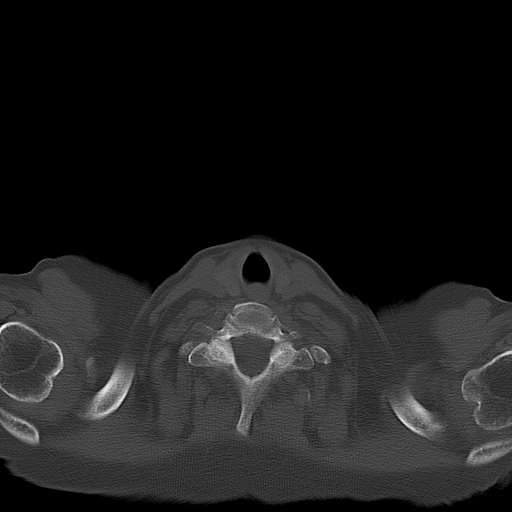

[Series 603: range-ac ct sk_thigh 5.0 hd_fov-cor-<alpha range> · 3 of 109 slices shown]
[im 1/109]
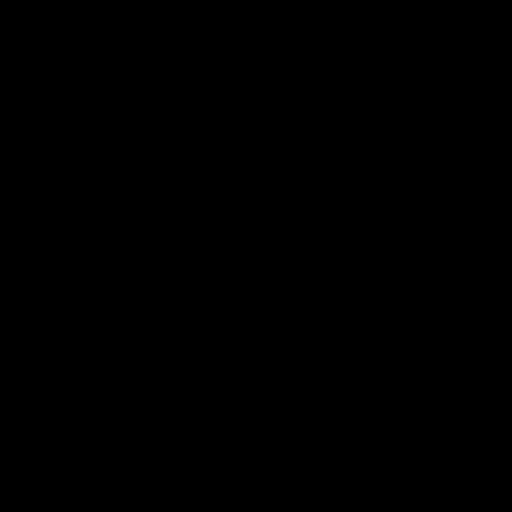
[im 55/109]
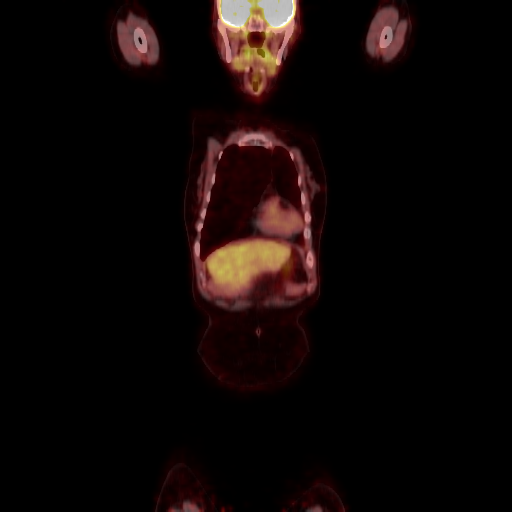
[im 109/109]
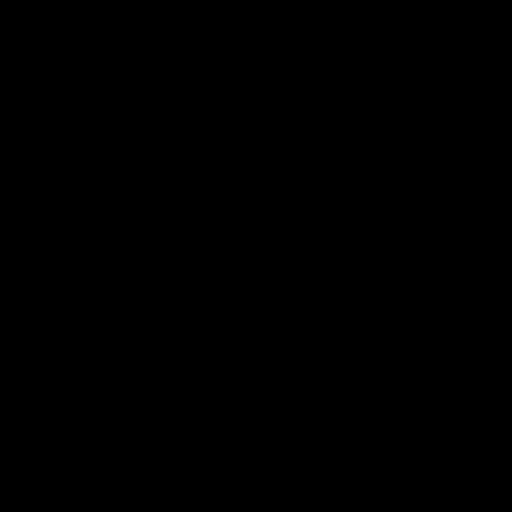

[Series 604: mip range 3 · coronal · 1.70mm/px · 1 of 32 slices shown]
[im 1/32]
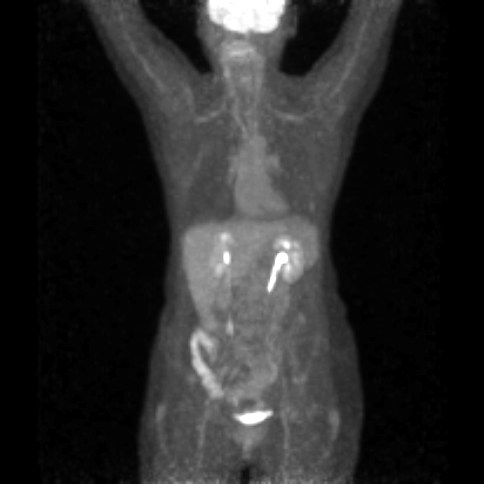

[Series 605: range-ac ct sk_thigh 5.0 hd_fov-tra-<alpha range> · 5 of 200 slices shown]
[im 1/200]
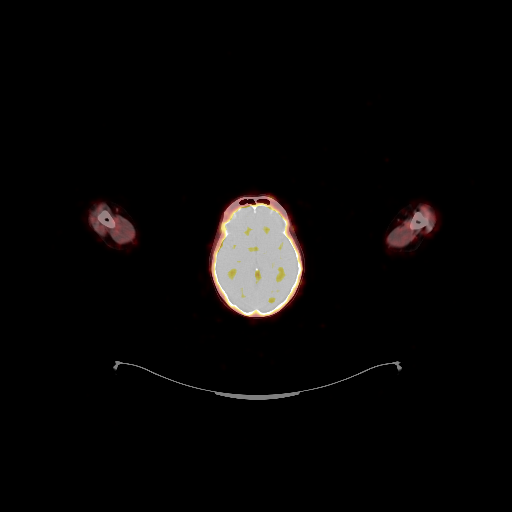
[im 50/200]
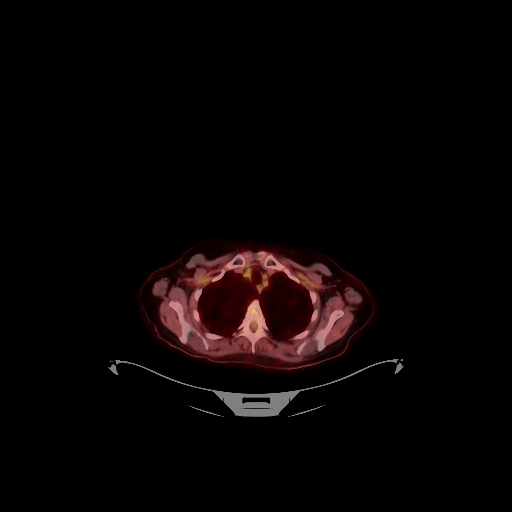
[im 100/200]
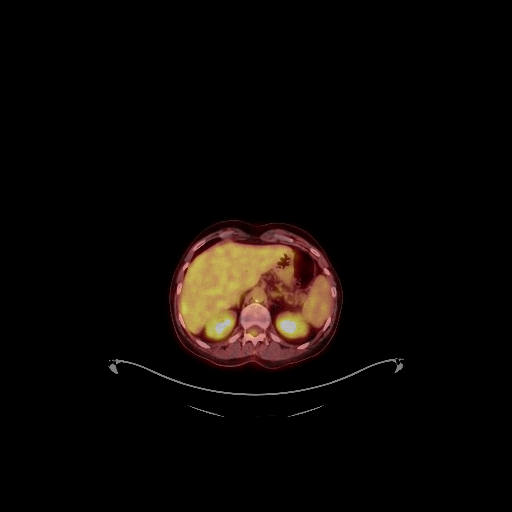
[im 150/200]
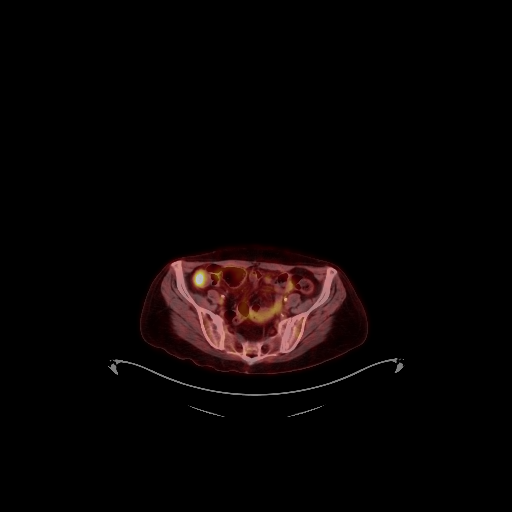
[im 200/200]
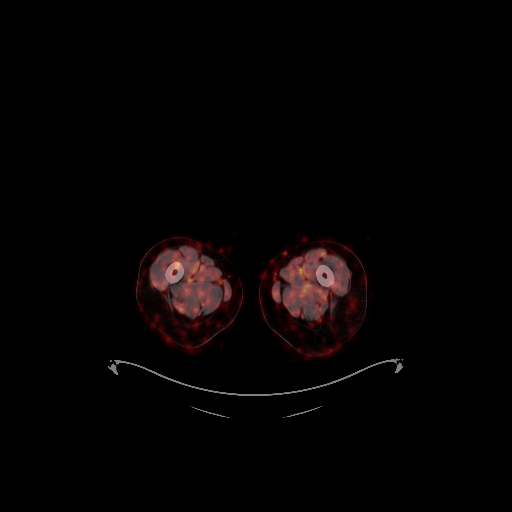

[25 of 25 positions shown; findings below may reference images not displayed]

FINDINGS: Mediastinal blood pool activity: SUV max

Liver activity: SUV max NA

NECK: No significant abnormal hypermetabolic activity in this
region.

Incidental CT findings: Chronic right maxillary sinusitis.

CHEST: The bilobed right lower lobe pulmonary nodule measures 1.0 by
0.7 cm on image 68/2 and has a maximum SUV of 1.0.

The subtle ground-glass nodularity in the right upper lobe is not
appreciably hypermetabolic.

Incidental CT findings: Coronary, aortic arch, and branch vessel
atherosclerotic vascular disease. Postoperative findings in the left
lung.

ABDOMEN/PELVIS: No significant abnormal hypermetabolic activity in
this region.

Incidental CT findings: Aortoiliac atherosclerotic vascular disease.

SKELETON: No significant abnormal hypermetabolic activity in this
region.

Incidental CT findings: none
IMPRESSION: 1. The bilobed right lower lobe pulmonary nodule has a maximum SUV
of 1.0. Given the slow growth this is considerably likely to
represent a low-grade adenocarcinoma despite the low metabolic
activity.
2.  Aortic Atherosclerosis ([id number]).  Coronary atherosclerosis.
3. Chronic right maxillary sinusitis.

## 2021-08-14 ENCOUNTER — Ambulatory Visit: Payer: Medicare Other | Admitting: Internal Medicine

## 2021-08-14 NOTE — Progress Notes (Deleted)
[name], female    DOB: [birth year],    MRN: [id number]   Brief patient profile:  64 yowf    quit smoking 2019 p started albuterol  inhalers around 2014 then added symbicort changed to wixella  due to cost referred to pulmonary clinic 06/29/2021 by Dr Ethlyn Gallery  who recently changed to breztri which seemed to helped even more.   PFTs 12/14/19 c/w GOLD 3 copd   04/12/21 oncology note DIAGNOSIS:  1) Stage Ib (T2, N0, M0) non-small cell lung cancer, adenocarcinoma diagnosed in June 2019. 2) stage Ia (T1 a, N0, M0) non-small cell lung cancer, adenocarcinoma status post   PRIOR THERAPY:  1) status post wedge resection of the left upper lobe nodule in addition to lingular segmentectomy and mediastinal lymph node sampling under the care of Dr. Roxan Hockey on March 13, 2018.  Right lower lobe segmentectomy under the care of Dr. Roxan Hockey on December 18, 2019.   CURRENT THERAPY: Observation  History of Present Illness  06/29/2021  Pulmonary/ 1st office eval/[name]  Chief Complaint  Patient presents with   Pulmonary Consult     Referred by Dr. Gwyneth Revels. Pt has hx of lung ca and she was told back in 2018 she has COPD. She gets winded just walking around her house. She had PNA approx 2 months.    Dyspnea:  one room to the next / shops on line  Cough: prednisone helped still some residual dry cough  Sleep: on side / electric bed 30 degrees  SABA use: one bid hfa/ neb once daily  Rec Plan A = Automatic = Always=    wixella one twice daily  = trelegy 100 once daily  or Breztri 2 puff every 12 hours  Breathe out thru nose  Plan B = Backup (to supplement plan A, not to replace it) Only use your albuterol inhaler as a rescue medication  Plan C = Crisis (instead of Plan B but only if Plan B stops working) - only use your albuterol nebulizer if you first try Plan B and it fails to help Call we the cheapest alternatives  for example wixellla / incruse   = trelegy       08/14/2021  f/u  ov/[name] re: ***   maint on ***  No chief complaint on file.   Dyspnea:  *** Cough: *** Sleeping: *** SABA use: *** 02: *** Covid status:   ***   No obvious day to day or daytime variability or assoc excess/ purulent sputum or mucus plugs or hemoptysis or cp or chest tightness, subjective wheeze or overt sinus or hb symptoms.   *** without nocturnal  or early am exacerbation  of respiratory  c/o's or need for noct saba. Also denies any obvious fluctuation of symptoms with weather or environmental changes or other aggravating or alleviating factors except as outlined above   No unusual exposure hx or h/o childhood pna/ asthma or knowledge of premature birth.  Current Allergies, Complete Past Medical History, Past Surgical History, Family History, and Social History were reviewed in [employer] record.  ROS  The following are not active complaints unless bolded Hoarseness, sore throat, dysphagia, dental problems, itching, sneezing,  nasal congestion or discharge of excess mucus or purulent secretions, ear ache,   fever, chills, sweats, unintended wt loss or wt gain, classically pleuritic or exertional cp,  orthopnea pnd or arm/hand swelling  or leg swelling, presyncope, palpitations, abdominal pain, anorexia, nausea, vomiting, diarrhea  or change in bowel  habits or change in bladder habits, change in stools or change in urine, dysuria, hematuria,  rash, arthralgias, visual complaints, headache, numbness, weakness or ataxia or problems with walking or coordination,  change in mood or  memory.        No outpatient medications have been marked as taking for the 08/14/21 encounter (Appointment) with Tanda Rockers, MD.                 Past Medical History:  Diagnosis Date   Anxiety    Arthritis of hand 04/17/2017   Bronchitis    Depression    Dyspnea    Emphysema, unspecified ([place])    per patient mild case   Family history of adverse reaction to anesthesia     patient states sister has a hard time waking up from anesthesia.    Hepatitis C    lung ca dx'd 03/2018   Tremor, unspecified    non-specific; takes inderal.          Objective:    Wt Readings from Last 3 Encounters:  06/29/21 120 lb (54.4 kg)  06/07/21 119 lb 11.2 oz (54.3 kg)  05/25/21 115 lb (52.2 kg)      Vital signs reviewed  08/14/2021  - Note at rest 02 sats  ***% on ***   General appearance:    ***      Min bar***        Assessment

## 2021-08-18 ENCOUNTER — Emergency Department (HOSPITAL_COMMUNITY)
Admission: EM | Admit: 2021-08-18 | Discharge: 2021-08-18 | Disposition: A | Discharge status: Home / Self Care | Payer: Medicare Other | Attending: Emergency Medicine | Admitting: Emergency Medicine

## 2021-08-18 ENCOUNTER — Emergency Department (HOSPITAL_COMMUNITY): Payer: Medicare Other

## 2021-08-18 ENCOUNTER — Encounter (HOSPITAL_COMMUNITY): Payer: Self-pay

## 2021-08-18 ENCOUNTER — Other Ambulatory Visit: Payer: Self-pay

## 2021-08-18 DIAGNOSIS — J209 Acute bronchitis, unspecified: Secondary | ICD-10-CM | POA: Diagnosis not present

## 2021-08-18 DIAGNOSIS — Z7951 Long term (current) use of inhaled steroids: Secondary | ICD-10-CM | POA: Diagnosis not present

## 2021-08-18 DIAGNOSIS — Z85118 Personal history of other malignant neoplasm of bronchus and lung: Secondary | ICD-10-CM | POA: Insufficient documentation

## 2021-08-18 DIAGNOSIS — Z20822 Contact with and (suspected) exposure to covid-19: Secondary | ICD-10-CM | POA: Insufficient documentation

## 2021-08-18 DIAGNOSIS — Z87891 Personal history of nicotine dependence: Secondary | ICD-10-CM | POA: Diagnosis not present

## 2021-08-18 DIAGNOSIS — J449 Chronic obstructive pulmonary disease, unspecified: Secondary | ICD-10-CM | POA: Diagnosis not present

## 2021-08-18 DIAGNOSIS — R0602 Shortness of breath: Secondary | ICD-10-CM | POA: Diagnosis present

## 2021-08-18 LAB — RESP PANEL BY RT-PCR (FLU A&B, COVID) ARPGX2
Influenza A by PCR: NEGATIVE
Influenza B by PCR: NEGATIVE
SARS Coronavirus 2 by RT PCR: NEGATIVE

## 2021-08-18 LAB — CBC
HCT: 44.3 % (ref 36.0–46.0)
Hemoglobin: 14.8 g/dL (ref 12.0–15.0)
MCH: 29.3 pg (ref 26.0–34.0)
MCHC: 33.4 g/dL (ref 30.0–36.0)
MCV: 87.7 fL (ref 80.0–100.0)
Platelets: 423 10*3/uL — ABNORMAL HIGH (ref 150–400)
RBC: 5.05 MIL/uL (ref 3.87–5.11)
RDW: 12.2 % (ref 11.5–15.5)
WBC: 10.9 10*3/uL — ABNORMAL HIGH (ref 4.0–10.5)
nRBC: 0 % (ref 0.0–0.2)

## 2021-08-18 LAB — BASIC METABOLIC PANEL
Anion gap: 12 (ref 5–15)
BUN: 13 mg/dL (ref 8–23)
CO2: 29 mmol/L (ref 22–32)
Calcium: 9.5 mg/dL (ref 8.9–10.3)
Chloride: 98 mmol/L (ref 98–111)
Creatinine, Ser: 0.9 mg/dL (ref 0.44–1.00)
GFR, Estimated: >60 mL/min (ref >60)
Glucose, Bld: 111 mg/dL — ABNORMAL HIGH (ref 70–99)
Potassium: 4.1 mmol/L (ref 3.5–5.1)
Sodium: 139 mmol/L (ref 135–145)

## 2021-08-18 LAB — D-DIMER, QUANTITATIVE: D-Dimer, Quant: 0.37 ug/mL-FEU (ref 0.00–0.50)

## 2021-08-18 MED ORDER — DOXYCYCLINE HYCLATE 100 MG PO TABS: 100.0000 mg | ORAL_TABLET | Freq: Two times a day (BID) | ORAL | 0 refills | Status: AC

## 2021-08-18 MED ORDER — PREDNISONE 50 MG PO TABS: 50.0000 mg | ORAL_TABLET | Freq: Every day | ORAL | 0 refills | Status: DC

## 2021-08-18 MED ORDER — BENZONATATE 100 MG PO CAPS
100.0000 mg | ORAL_CAPSULE | Freq: Once | ORAL | Status: AC
  Administered 2021-08-18: 100 mg via ORAL
  Filled 2021-08-18: qty 1

## 2021-08-18 MED ORDER — BENZONATATE 100 MG PO CAPS: 100.0000 mg | ORAL_CAPSULE | Freq: Three times a day (TID) | ORAL | 0 refills | Status: DC

## 2021-08-18 NOTE — ED Notes (Signed)
Pt states understanding of dc instructions, importance of follow up, and prescription. Pt denies questions or concerns upon dc. Pt declined wheelchair assistance upon dc. Pt ambulated out of ed w/ steady gait. No belongings left in room upon dc.  

## 2021-08-18 NOTE — ED Provider Notes (Signed)
[facility] DEPT Provider Note   CSN: [id number] Arrival date & time: 08/18/21  1550     History Chief Complaint  Patient presents with   Shortness of Breath    [name] is a 65 y.o. female.   Shortness of Breath Severity:  Moderate Onset quality:  Gradual Duration:  2 weeks Timing:  Constant Progression:  Worsening Context: URI   Relieved by:  Nothing Worsened by:  Activity Ineffective treatments: breathing treatments 4 times per day. Associated symptoms: cough, headaches and sputum production (green)   Associated symptoms: no fever   Associated symptoms comment:  Nasal congestion  Risk factors comment:  COPD and Lung CA Patient states she called her doctor and was instructed to come to the ED for evaluation.    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    Hepatitis C    lung ca dx'd 03/2018   Tremor, unspecified    non-specific; takes inderal.     Patient Active Problem List   Diagnosis Date Noted   COPD GOLD 3  06/29/2021   Adenocarcinoma of right lung, stage 1 ([place]) 04/11/2020   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   Adenocarcinoma of left lung, stage 1 ([place]) 08/25/2019   Lung mass 03/13/2018   Chronic bronchitis ([place]) 02/04/2018   Left upper lobe pulmonary nodule 02/04/2018   Vaccine counseling 11/13/2017   Cirrhosis ([place]) 11/13/2017   Arthritis of hand 04/17/2017   Hepatitis C, chronic ([place]) 03/14/2015   Tobacco use disorder 01/21/2014   Tremor 10/14/2012   Migraine headache 08/01/2010   Depression, major, single episode, moderate ([place]) 03/19/2008   GERD 03/19/2008   NEPHROLITHIASIS, HX OF 03/19/2008    Past Surgical History:  Procedure Laterality Date    ABDOMINAL HYSTERECTOMY  1986   menorrhagia; hx of cryo for abnormal paps   APPENDECTOMY     removed in 1986 per pt   INTERCOSTAL NERVE BLOCK Right 12/18/2019   Procedure: Intercostal Nerve Block;  Surgeon: Melrose Nakayama, MD;  Location: [facility];  Service: Thoracic;  Laterality: Right;   LOBECTOMY Left 03/13/2018   Procedure: Lingula section of left upper lobe lobectomy;  Surgeon: Melrose Nakayama, MD;  Location: [facility];  Service: Thoracic;  Laterality: Left;   NODE DISSECTION  12/18/2019   Procedure: Node Dissection;  Surgeon: Melrose Nakayama, MD;  Location: [facility];  Service: Thoracic;;   VIDEO ASSISTED THORACOSCOPY (VATS)/WEDGE RESECTION Left 03/13/2018   Procedure: VIDEO ASSISTED THORACOSCOPY (VATS)/WEDGE RESECTION;  Surgeon: Melrose Nakayama, MD;  Location: [facility];  Service: Thoracic;  Laterality: Left;     OB History   No obstetric history on file.     Family History  Problem Relation Age of Onset   Asthma Mother    Alzheimer's disease Mother 31   Hypertension Mother    Other Father        shot    Neuropathy Sister        face   Cancer Maternal Aunt        breast   Cancer Paternal Aunt    Cancer Paternal Grandmother        abdominal  Lung cancer Paternal Uncle     Social History   Tobacco Use   Smoking status: Former    Packs/day: 1.00    Years: 40.00    Pack years: 40.00    Types: Cigarettes    Quit date: 12/14/2017    Years since quitting: 3.6   Smokeless tobacco: Never  Vaping Use   Vaping Use: Never used  Substance Use Topics   Alcohol use: Yes    Alcohol/week: 7.0 standard drinks    Types: 7 Shots of liquor per week    Comment: one margarita  every night   Drug use: Not Currently    Frequency: 4.0 times per week    Types: Marijuana    Comment: occasional use. last use; 03/08/18    Home Medications Prior to Admission medications   Medication Sig Start Date End Date Taking? Authorizing Provider  benzonatate (TESSALON) 100 MG capsule  Take 1 capsule (100 mg total) by mouth every 8 (eight) hours. 08/18/21  Yes Dorie Rank, MD  doxycycline (VIBRA-TABS) 100 MG tablet Take 1 tablet (100 mg total) by mouth 2 (two) times daily for 7 days. 08/18/21 08/25/21 Yes Dorie Rank, MD  predniSONE (DELTASONE) 50 MG tablet Take 1 tablet (50 mg total) by mouth daily. 08/18/21  Yes Dorie Rank, MD  albuterol (PROVENTIL) (2.5 MG/3ML) 0.083% nebulizer solution Take 3 mLs (2.5 mg total) by nebulization every 6 (six) hours as needed for wheezing or shortness of breath. 06/01/21   Koberlein, Steele Berg, MD  albuterol (VENTOLIN HFA) 108 (90 Base) MCG/ACT inhaler Inhale 2 puffs into the lungs every 6 (six) hours as needed for wheezing or shortness of breath. 05/03/21   Koberlein, Steele Berg, MD  fluticasone-salmeterol (WIXELA INHUB) 500-50 MCG/ACT AEPB Inhale 1 puff into the lungs in the morning and at bedtime. 05/03/21   Koberlein, Steele Berg, MD  Fluticasone-Umeclidin-Vilant (TRELEGY ELLIPTA) 100-62.5-25 MCG/INH AEPB Inhale 1 puff into the lungs daily. 06/29/21   Tanda Rockers, MD  ipratropium (ATROVENT) 0.02 % nebulizer solution Take 2.5 mLs (0.5 mg total) by nebulization 4 (four) times daily. Patient not taking: Reported on 06/29/2021 06/02/21   [name], MD  Melatonin 10 MG TABS Take 10 mg by mouth at bedtime.    [provider]  omeprazole (PRILOSEC) 20 MG capsule Take 20 mg by mouth daily.    [provider]  PARoxetine (PAXIL) 40 MG tablet Take 1 tablet (40 mg total) by mouth daily. 05/03/21   [name], MD  propranolol ER (INDERAL LA) 60 MG 24 hr capsule Take 1 capsule (60 mg total) by mouth daily. 05/31/21   [name], MD  traZODone (DESYREL) 50 MG tablet Take 0.5-1 tablets (25-50 mg total) by mouth at bedtime as needed for sleep. 07/03/21   [name], MD    Allergies    Aspirin, Morphine sulfate, Oxycodone, Glycopyrrolate, Lactose intolerance (gi), Penicillins, and Sulfa antibiotics  Review of Systems    Review of Systems  Constitutional:  Negative for fever.  Respiratory:  Positive for cough, sputum production (green) and shortness of breath.   Neurological:  Positive for headaches.  All other systems reviewed and are negative.  Physical Exam Updated Vital Signs BP 112/88   Pulse 80   Temp 97.8 F (36.6 C) (Oral)   Resp 18   SpO2 92%   Physical Exam Vitals and nursing note reviewed.  Constitutional:      General: She is not in acute distress.    Appearance:  She is well-developed.  HENT:     Head: Normocephalic and atraumatic.     Right Ear: External ear normal.     Left Ear: External ear normal.  Eyes:     General: No scleral icterus.       Right eye: No discharge.        Left eye: No discharge.     Conjunctiva/sclera: Conjunctivae normal.  Neck:     Trachea: No tracheal deviation.  Cardiovascular:     Rate and Rhythm: Normal rate and regular rhythm.  Pulmonary:     Effort: Pulmonary effort is normal. No respiratory distress.     Breath sounds: Normal breath sounds. No stridor. No wheezing or rales.     Comments: Frequent coughing Abdominal:     General: Bowel sounds are normal. There is no distension.     Palpations: Abdomen is soft.     Tenderness: There is no abdominal tenderness. There is no guarding or rebound.  Musculoskeletal:        General: No tenderness or deformity.     Cervical back: Neck supple.  Skin:    General: Skin is warm and dry.     Findings: No rash.  Neurological:     General: No focal deficit present.     Mental Status: She is alert.     Cranial Nerves: No cranial nerve deficit (no facial droop, extraocular movements intact, no slurred speech).     Sensory: No sensory deficit.     Motor: No abnormal muscle tone or seizure activity.     Coordination: Coordination normal.  Psychiatric:        Mood and Affect: Mood normal.    ED Results / Procedures / Treatments   Labs (all labs ordered are listed, but only abnormal results are  displayed) Labs Reviewed  CBC - Abnormal; Notable for the following components:      Result Value   WBC 10.9 (*)    Platelets 423 (*)    All other components within normal limits  BASIC METABOLIC PANEL - Abnormal; Notable for the following components:   Glucose, Bld 111 (*)    All other components within normal limits  RESP PANEL BY RT-PCR (FLU A&B, COVID) ARPGX2  D-DIMER, QUANTITATIVE    EKG None  Radiology DG Chest 2 View  Result Date: 08/18/2021 CLINICAL DATA:  Shortness of breath and cough. EXAM: CHEST - 2 VIEW COMPARISON:  June 19, 2021 FINDINGS: Diffuse, chronic appearing increased interstitial lung markings are seen. There is no evidence of acute infiltrate or pneumothorax. A small, stable right pleural effusion is seen. The heart size and mediastinal contours are within normal limits. The visualized skeletal structures are unremarkable. IMPRESSION: Chronic appearing increased interstitial lung markings with a small right pleural effusion. Electronically Signed   By: Virgina Norfolk M.D.   On: 08/18/2021 17:53    Procedures Procedures   Medications Ordered in ED Medications  benzonatate (TESSALON) capsule 100 mg (100 mg Oral Given 08/18/21 1637)    ED Course  I have reviewed the triage vital signs and the nursing notes.  Pertinent labs & imaging results that were available during my care of the patient were reviewed by me and considered in my medical decision making (see chart for details).  Clinical Course as of 08/18/21 1815  Fri Aug 18, 2021  1806 Covid and flu are negative.  D-dimer is negative.  CBC and metabolic panel unremarkable [JK]  1806 No signs of pneumonia on chest x-ray.  Chronic bronchitis findings [JK]    Clinical Course User Index [JK] Dorie Rank, MD   MDM Rules/Calculators/A&P                           Patient presented to the ED for evaluation of shortness of breath and persistent coughing with mucopurulent sputum.  Patient does have  history of COPD.  Chest x-ray does not show pneumonia.  D-dimer is negative.  Doubt pulmonary embolism.  No findings to suggest fluid overload, CHF.  Patient is not wheezing on my exam but I suspect she is having issues with bronchitis and COPD exacerbation.  COVID and flu are negative.  Will discharge home with prescriptions for steroids antibiotic considering the mucopurulent sputum and cough medicine. Final Clinical Impression(s) / ED Diagnoses Final diagnoses:  COPD (chronic obstructive pulmonary disease) with acute bronchitis ([place])    Rx / DC Orders ED Discharge Orders          Ordered    predniSONE (DELTASONE) 50 MG tablet  Daily        08/18/21 1813    doxycycline (VIBRA-TABS) 100 MG tablet  2 times daily        08/18/21 1813    benzonatate (TESSALON) 100 MG capsule  Every 8 hours        08/18/21 1813             Dorie Rank, MD 08/18/21 1815

## 2021-08-18 NOTE — ED Triage Notes (Signed)
Pt presents with shortness of breath x2 days. Pt reports hx of lung cancer and COPD and has hd previous lung operations. Pulmonologist suggested that she come here today for evaluation.

## 2021-08-18 NOTE — Discharge Instructions (Signed)
Take your medications as prescribed.  Follow-up with your doctor to be rechecked.  Return as needed for worsening symptoms

## 2021-09-04 ENCOUNTER — Telehealth: Payer: Self-pay | Admitting: Family Medicine

## 2021-09-04 ENCOUNTER — Emergency Department (HOSPITAL_COMMUNITY)
Admission: EM | Admit: 2021-09-04 | Discharge: 2021-09-04 | Disposition: A | Discharge status: Home / Self Care | Payer: Medicare Other | Attending: Emergency Medicine | Admitting: Emergency Medicine

## 2021-09-04 ENCOUNTER — Emergency Department (HOSPITAL_COMMUNITY): Payer: Medicare Other

## 2021-09-04 ENCOUNTER — Other Ambulatory Visit: Payer: Self-pay

## 2021-09-04 ENCOUNTER — Encounter (HOSPITAL_COMMUNITY): Payer: Self-pay

## 2021-09-04 DIAGNOSIS — J441 Chronic obstructive pulmonary disease with (acute) exacerbation: Secondary | ICD-10-CM | POA: Insufficient documentation

## 2021-09-04 DIAGNOSIS — Z20822 Contact with and (suspected) exposure to covid-19: Secondary | ICD-10-CM | POA: Diagnosis not present

## 2021-09-04 DIAGNOSIS — R0602 Shortness of breath: Secondary | ICD-10-CM | POA: Diagnosis present

## 2021-09-04 DIAGNOSIS — Z87891 Personal history of nicotine dependence: Secondary | ICD-10-CM | POA: Insufficient documentation

## 2021-09-04 DIAGNOSIS — Z85118 Personal history of other malignant neoplasm of bronchus and lung: Secondary | ICD-10-CM | POA: Insufficient documentation

## 2021-09-04 LAB — RESP PANEL BY RT-PCR (FLU A&B, COVID) ARPGX2
Influenza A by PCR: NEGATIVE
Influenza B by PCR: NEGATIVE
SARS Coronavirus 2 by RT PCR: NEGATIVE

## 2021-09-04 LAB — BASIC METABOLIC PANEL
Anion gap: 6 (ref 5–15)
BUN: 14 mg/dL (ref 8–23)
CO2: 27 mmol/L (ref 22–32)
Calcium: 8.8 mg/dL — ABNORMAL LOW (ref 8.9–10.3)
Chloride: 98 mmol/L (ref 98–111)
Creatinine, Ser: 0.78 mg/dL (ref 0.44–1.00)
GFR, Estimated: >60 mL/min (ref >60)
Glucose, Bld: 140 mg/dL — ABNORMAL HIGH (ref 70–99)
Potassium: 3.4 mmol/L — ABNORMAL LOW (ref 3.5–5.1)
Sodium: 131 mmol/L — ABNORMAL LOW (ref 135–145)

## 2021-09-04 LAB — CBC
HCT: 44.9 % (ref 36.0–46.0)
Hemoglobin: 14.7 g/dL (ref 12.0–15.0)
MCH: 28.8 pg (ref 26.0–34.0)
MCHC: 32.7 g/dL (ref 30.0–36.0)
MCV: 88 fL (ref 80.0–100.0)
Platelets: 348 10*3/uL (ref 150–400)
RBC: 5.1 MIL/uL (ref 3.87–5.11)
RDW: 12.4 % (ref 11.5–15.5)
WBC: 10.8 10*3/uL — ABNORMAL HIGH (ref 4.0–10.5)
nRBC: 0 % (ref 0.0–0.2)

## 2021-09-04 MED ORDER — TRAZODONE HCL 100 MG PO TABS: 100.0000 mg | ORAL_TABLET | Freq: Every evening | ORAL | 1 refills | Status: DC | PRN

## 2021-09-04 MED ORDER — PREDNISONE 50 MG PO TABS
50.0000 mg | ORAL_TABLET | Freq: Once | ORAL | Status: AC
  Administered 2021-09-04: 50 mg via ORAL
  Filled 2021-09-04: qty 1

## 2021-09-04 MED ORDER — PREDNISONE 10 MG PO TABS: ORAL_TABLET | ORAL | 0 refills | Status: AC

## 2021-09-04 NOTE — Telephone Encounter (Signed)
Spoke with the patient, informed her of the message and she is aware the Rx was sent to [facility].

## 2021-09-04 NOTE — Telephone Encounter (Signed)
Pt is has been on 50 mg of Trazadone for sleep for awhile.  This is no longer helping patient and she wants to know if she can increase Trazadone to 100 mg.  If so please call in prescription.   Pharmacy--[facility] on [address]

## 2021-09-04 NOTE — Telephone Encounter (Signed)
That's fine to increase to 100mg  nightly prn insomnia. Let me know if she doesn't get relief with this. Ok to do 90 day supply w 1 refill.

## 2021-09-04 NOTE — Discharge Instructions (Signed)
Take the steroid medications to help with your COPD exacerbation.  Continue your breathing treatments at home.  Follow-up with your pulmonary doctor to be rechecked.  Return as needed for worsening symptoms
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
## 2021-09-04 NOTE — ED Triage Notes (Signed)
Pt BIB EMS from home. Pt reports exacerbation of her COPD a few days ago after exposure to Lysol. Endorses SHOB since diagnosis of bronchitis, but it worsened after exposure.   10 albuterol and 1 atrovent neb treatments 0.3 epi IM   147/82 84% RA initially

## 2021-09-07 ENCOUNTER — Ambulatory Visit (INDEPENDENT_AMBULATORY_CARE_PROVIDER_SITE_OTHER): Payer: Medicare Other | Admitting: Internal Medicine

## 2021-09-07 ENCOUNTER — Encounter: Payer: Self-pay | Admitting: Internal Medicine

## 2021-09-07 ENCOUNTER — Other Ambulatory Visit: Payer: Self-pay

## 2021-09-07 DIAGNOSIS — R251 Tremor, unspecified: Secondary | ICD-10-CM

## 2021-09-07 DIAGNOSIS — J449 Chronic obstructive pulmonary disease, unspecified: Secondary | ICD-10-CM | POA: Diagnosis not present

## 2021-09-07 MED ORDER — BUDESONIDE-FORMOTEROL FUMARATE 160-4.5 MCG/ACT IN AERO: INHALATION_SPRAY | RESPIRATORY_TRACT | 12 refills | Status: DC

## 2021-09-07 NOTE — Patient Instructions (Addendum)
Plan A = Automatic = Always=    symbicort 160 instead of breztri @ Take 2 puffs first thing in am and then another 2 puffs about 12 hours later.    Plan B = Backup (to supplement plan A, not to replace it) Only use your albuterol (PROAIR) inhaler as a rescue medication to be used if you can't catch your breath by resting or doing a relaxed purse lip breathing pattern.  - The less you use it, the better it will work when you need it. - Ok to use the inhaler up to 2 puffs  every 4 hours if you must but call for appointment if use goes up over your usual need - Don't leave home without it !!  (think of it like the spare tire for your car)   Plan C = Crisis (instead of Plan B but only if Plan B stops working) - only use your albuterol/atrovent  nebulizer if you first try Plan B and it fails to help > ok to use the nebulizer up to every 4 hours but if start needing it regularly call for immediate appointment   Plan D = Doctor - call me if B and C not adequate  Plan E = ER - go to ER or call 911 if all else fails   Use the lowest possible dose of Inderal    Please schedule a follow up visit in 6  months but call sooner if needed

## 2021-09-07 NOTE — Progress Notes (Signed)
[name], female    DOB: April 17, 1956,    MRN: [id number]   Brief patient profile:  41 yowf    quit smoking 2019 p started albuterol  inhalers around 2014 then added symbicort changed to wixella  due to cost referred to pulmonary clinic 06/29/2021 by Dr Ethlyn Gallery  who recently changed to breztri which seemed to helped even more.   PFTs 12/14/19 c/w GOLD 3 copd   04/12/21 oncology note DIAGNOSIS:  1) Stage Ib (T2, N0, M0) non-small cell lung cancer, adenocarcinoma diagnosed in June 2019. 2) stage Ia (T1 a, N0, M0) non-small cell lung cancer, adenocarcinoma status post   PRIOR THERAPY:  1) status post wedge resection of the left upper lobe nodule in addition to lingular segmentectomy and mediastinal lymph node sampling under the care of Dr. Roxan Hockey on March 13, 2018.  Right lower lobe segmentectomy under the care of Dr. Roxan Hockey on December 18, 2019.   CURRENT THERAPY: Observation  History of Present Illness  06/29/2021  Pulmonary/ 1st office eval/[facility]  Chief Complaint  Patient presents with   Pulmonary Consult     Referred by Dr. Gwyneth Revels. Pt has hx of lung ca and she was told back in 2018 she has COPD. She gets winded just walking around her house. She had PNA approx 2 months.    Dyspnea:  one room to the next / shops on line  Cough: prednisone helped still some residual dry cough  Sleep: on side / electric bed 30 degrees  SABA use: one bid hfa/ neb once daily  Rec Plan A = Automatic = Always=    wixella one twice daily  = trelegy 100 once daily  or Breztri 2 puff every 12 hours  Breathe out thru nose  Plan B = Backup (to supplement plan A, not to replace it) Only use your albuterol inhaler as a rescue medication  Plan C = Crisis (instead of Plan B but only if Plan B stops working) - only use your albuterol nebulizer if you first try Plan B and it fails to help Call we the cheapest alternatives  for example wixellla / incruse   = trelegy       09/07/2021  f/u  ov/Anamaria Dusenbury re: GOLD 3 COPD   maint on breztri 2bid  / pred 40 taper / mucinex/ no abx Chief Complaint  Patient presents with   Follow-up    Pt states concerns for COPD exacerbation  Dyspnea:  worse since end of Oct  2022  Cough flutter valve helps > mucus clear now  Sleeping: better now  SABA use: one duoneb at 5 h prior to OV   02: none  Covid status:   vax x 2    No obvious day to day or daytime variability or  purulent sputum or mucus plugs or hemoptysis or cp or chest tightness, subjective wheeze or overt sinus or hb symptoms.   Sleeping now  without nocturnal  or early am exacerbation  of respiratory  c/o's or need for noct saba. Also denies any obvious fluctuation of symptoms with weather or environmental changes or other aggravating or alleviating factors except as outlined above   No unusual exposure hx or h/o childhood pna/ asthma or knowledge of premature birth.  Current Allergies, Complete Past Medical History, Past Surgical History, Family History, and Social History were reviewed in [employer] record.  ROS  The following are not active complaints unless bolded Hoarseness, sore throat, dysphagia, dental problems, itching,  sneezing,  nasal congestion or discharge of excess mucus or purulent secretions, ear ache,   fever, chills, sweats, unintended wt loss or wt gain, classically pleuritic or exertional cp,  orthopnea pnd or arm/hand swelling  or leg swelling, presyncope, palpitations, abdominal pain, anorexia, nausea, vomiting, diarrhea  or change in bowel habits or change in bladder habits, change in stools or change in urine, dysuria, hematuria,  rash, arthralgias, visual complaints, headache, numbness, weakness or ataxia or problems with walking or coordination,  change in mood or  memory.        Current Meds  Medication Sig   albuterol (PROVENTIL) (2.5 MG/3ML) 0.083% nebulizer solution Take 3 mLs (2.5 mg total) by nebulization every 6 (six) hours as  needed for wheezing or shortness of breath.   albuterol (VENTOLIN HFA) 108 (90 Base) MCG/ACT inhaler Inhale 2 puffs into the lungs every 6 (six) hours as needed for wheezing or shortness of breath.   benzonatate (TESSALON) 100 MG capsule Take 1 capsule (100 mg total) by mouth every 8 (eight) hours.   fluticasone-salmeterol (WIXELA INHUB) 500-50 MCG/ACT AEPB Inhale 1 puff into the lungs in the morning and at bedtime.   ipratropium (ATROVENT) 0.02 % nebulizer solution Take 2.5 mLs (0.5 mg total) by nebulization 4 (four) times daily.   Melatonin 10 MG TABS Take 10 mg by mouth at bedtime.   omeprazole (PRILOSEC) 20 MG capsule Take 20 mg by mouth daily.   PARoxetine (PAXIL) 40 MG tablet Take 1 tablet (40 mg total) by mouth daily.   predniSONE (DELTASONE) 10 MG tablet Take 5 tablets (50 mg total) by mouth daily with breakfast for 2 days, THEN 4 tablets (40 mg total) daily with breakfast for 2 days, THEN 3 tablets (30 mg total) daily with breakfast for 2 days, THEN 2 tablets (20 mg total) daily with breakfast for 2 days, THEN 1 tablet (10 mg total) daily with breakfast for 2 days.   propranolol ER (INDERAL LA) 60 MG 24 hr capsule Take 1 capsule (60 mg total) by mouth daily.   traZODone (DESYREL) 100 MG tablet Take 1 tablet (100 mg total) by mouth at bedtime as needed for sleep.                 Past Medical History:  Diagnosis Date   Anxiety    Arthritis of hand 04/17/2017   Bronchitis    Depression    Dyspnea    Emphysema, unspecified ([place])    per patient mild case   Family history of adverse reaction to anesthesia    patient states sister has a hard time waking up from anesthesia.    Hepatitis C    lung ca dx'd 03/2018   Tremor, unspecified    non-specific; takes inderal.          Objective:    Wt Readings from Last 3 Encounters:  09/07/21 125 lb (56.7 kg)  06/29/21 120 lb (54.4 kg)  06/07/21 119 lb 11.2 oz (54.3 kg)      Vital signs reviewed  09/07/2021  - Note at rest 02 sats   93% on RA   General appearance:    amb wf nad     HEENT : pt wearing mask not removed for exam due to covid - 19 concerns.   NECK :  without JVD/Nodes/TM/ nl carotid upstrokes bilaterally   LUNGS: no acc muscle use,  Min barrel  contour chest wall with bilateral  slightly decreased bs s audible wheeze and  without  cough on insp or exp maneuvers and min  Hyperresonant  to  percussion bilaterally     CV:  RRR  no s3 or murmur or increase in P2, and no edema   ABD:  soft and nontender with pos end  insp Hoover's  in the supine position. No bruits or organomegaly appreciated, bowel sounds nl  MS:   Nl gait/  ext warm without deformities, calf tenderness, cyanosis or clubbing No obvious joint restrictions   SKIN: warm and dry without lesions    NEURO:  alert, approp, nl sensorium with  no motor or cerebellar deficits apparent.             Assessment

## 2021-09-08 ENCOUNTER — Encounter: Payer: Self-pay | Admitting: Internal Medicine

## 2021-09-08 NOTE — Assessment & Plan Note (Signed)
Quit smoking 2019 - PFT's  12/14/19 FEV1 1.02 (40 % ) ratio 0.39  p 20 % improvement from saba p ? prior to study with DLCO  10.06 (48%) corrects to 2.32 (55%)  for alv volume and FV curve classic concave exp loop - 06/29/2021  After extensive coaching inhaler device,  effectiveness =    90% with dpi, 75% with hfa   - 06/29/2021   Walked on RA x  3  lap(s) =  approx 750 ft @ mod fast pace, stopped due to end of study, sob  with lowest 02 sats 93% - 09/07/2021  After extensive coaching inhaler device,  effectiveness =    90%    Group D in terms of symptom/risk and laba/lama/ICS  therefore appropriate rx at this point >>>  breztri but she and insurance prefer symbicort 160 so that's fine as long as doe and freq of exacerbations don't crescendo   Re SABA :  I spent extra time with pt today reviewing appropriate use of albuterol for prn use on exertion with the following points: 1) saba is for relief of sob that does not improve by walking a slower pace or resting but rather if the pt does not improve after trying this first. 2) If the pt is convinced, as many are, that saba helps recover from activity faster then it's easy to tell if this is the case by re-challenging : ie stop, take the inhaler, then p 5 minutes try the exact same activity (intensity of workload) that just caused the symptoms and see if they are substantially diminished or not after saba 3) if there is an activity that reproducibly causes the symptoms, try the saba 15 min before the activity on alternate days   If in fact the saba really does help, then fine to continue to use it prn but advised may need to look closer at the maintenance regimen being used to achieve better control of airways disease with exertion.

## 2021-09-08 NOTE — Assessment & Plan Note (Signed)
Tolerating inderal so far but since it's serving to partially block the LABA and SABA rec seek to the lowest dose possible to control the tremors to avoid this drug interaction to the extent possible, esp since doesn't want to take lama         Each maintenance medication was reviewed in detail including emphasizing most importantly the difference between maintenance and prns and under what circumstances the prns are to be triggered using an action plan format where appropriate.  Total time for H and P, chart review, counseling, reviewing hfa device(s) and generating customized AVS unique to this office visit / same day charting = 25 min

## 2021-09-17 ENCOUNTER — Other Ambulatory Visit: Payer: Self-pay

## 2021-09-17 ENCOUNTER — Telehealth: Payer: Self-pay | Admitting: Pulmonary Disease

## 2021-09-17 ENCOUNTER — Inpatient Hospital Stay (HOSPITAL_COMMUNITY)
Admission: EM | Admit: 2021-09-17 | Discharge: 2021-09-20 | DRG: 190 | Disposition: A | Discharge status: Home / Self Care | Payer: Medicare Other | Attending: Family Medicine | Admitting: Family Medicine

## 2021-09-17 ENCOUNTER — Encounter (HOSPITAL_COMMUNITY): Payer: Self-pay

## 2021-09-17 DIAGNOSIS — J449 Chronic obstructive pulmonary disease, unspecified: Secondary | ICD-10-CM

## 2021-09-17 DIAGNOSIS — J45901 Unspecified asthma with (acute) exacerbation: Secondary | ICD-10-CM | POA: Diagnosis present

## 2021-09-17 DIAGNOSIS — Z825 Family history of asthma and other chronic lower respiratory diseases: Secondary | ICD-10-CM

## 2021-09-17 DIAGNOSIS — Z902 Acquired absence of lung [part of]: Secondary | ICD-10-CM

## 2021-09-17 DIAGNOSIS — Z801 Family history of malignant neoplasm of trachea, bronchus and lung: Secondary | ICD-10-CM

## 2021-09-17 DIAGNOSIS — F419 Anxiety disorder, unspecified: Secondary | ICD-10-CM | POA: Diagnosis present

## 2021-09-17 DIAGNOSIS — Z820 Family history of epilepsy and other diseases of the nervous system: Secondary | ICD-10-CM

## 2021-09-17 DIAGNOSIS — Z85118 Personal history of other malignant neoplasm of bronchus and lung: Secondary | ICD-10-CM

## 2021-09-17 DIAGNOSIS — J9601 Acute respiratory failure with hypoxia: Secondary | ICD-10-CM | POA: Diagnosis present

## 2021-09-17 DIAGNOSIS — F411 Generalized anxiety disorder: Secondary | ICD-10-CM | POA: Diagnosis present

## 2021-09-17 DIAGNOSIS — J441 Chronic obstructive pulmonary disease with (acute) exacerbation: Secondary | ICD-10-CM | POA: Diagnosis not present

## 2021-09-17 DIAGNOSIS — Z87891 Personal history of nicotine dependence: Secondary | ICD-10-CM

## 2021-09-17 DIAGNOSIS — R0602 Shortness of breath: Secondary | ICD-10-CM | POA: Diagnosis present

## 2021-09-17 DIAGNOSIS — Z20822 Contact with and (suspected) exposure to covid-19: Secondary | ICD-10-CM | POA: Diagnosis present

## 2021-09-17 DIAGNOSIS — K219 Gastro-esophageal reflux disease without esophagitis: Secondary | ICD-10-CM | POA: Diagnosis present

## 2021-09-17 DIAGNOSIS — Z8249 Family history of ischemic heart disease and other diseases of the circulatory system: Secondary | ICD-10-CM

## 2021-09-17 NOTE — ED Triage Notes (Signed)
Several week history of shortness of breath and dizziness since diagnosis of pneumonia and bronchitis. EMS reports oxygen sats 88% on room air.

## 2021-09-18 ENCOUNTER — Emergency Department (HOSPITAL_COMMUNITY): Payer: Medicare Other

## 2021-09-18 ENCOUNTER — Encounter (HOSPITAL_COMMUNITY): Payer: Self-pay

## 2021-09-18 DIAGNOSIS — F419 Anxiety disorder, unspecified: Secondary | ICD-10-CM | POA: Diagnosis not present

## 2021-09-18 DIAGNOSIS — R0602 Shortness of breath: Secondary | ICD-10-CM

## 2021-09-18 DIAGNOSIS — J9601 Acute respiratory failure with hypoxia: Secondary | ICD-10-CM | POA: Diagnosis present

## 2021-09-18 DIAGNOSIS — Z87891 Personal history of nicotine dependence: Secondary | ICD-10-CM | POA: Diagnosis not present

## 2021-09-18 DIAGNOSIS — Z20822 Contact with and (suspected) exposure to covid-19: Secondary | ICD-10-CM | POA: Diagnosis present

## 2021-09-18 DIAGNOSIS — K219 Gastro-esophageal reflux disease without esophagitis: Secondary | ICD-10-CM | POA: Diagnosis present

## 2021-09-18 DIAGNOSIS — J45901 Unspecified asthma with (acute) exacerbation: Secondary | ICD-10-CM | POA: Diagnosis present

## 2021-09-18 DIAGNOSIS — F411 Generalized anxiety disorder: Secondary | ICD-10-CM | POA: Diagnosis present

## 2021-09-18 DIAGNOSIS — J441 Chronic obstructive pulmonary disease with (acute) exacerbation: Principal | ICD-10-CM | POA: Diagnosis present

## 2021-09-18 DIAGNOSIS — Z801 Family history of malignant neoplasm of trachea, bronchus and lung: Secondary | ICD-10-CM | POA: Diagnosis not present

## 2021-09-18 DIAGNOSIS — Z8249 Family history of ischemic heart disease and other diseases of the circulatory system: Secondary | ICD-10-CM | POA: Diagnosis not present

## 2021-09-18 DIAGNOSIS — Z820 Family history of epilepsy and other diseases of the nervous system: Secondary | ICD-10-CM | POA: Diagnosis not present

## 2021-09-18 DIAGNOSIS — Z902 Acquired absence of lung [part of]: Secondary | ICD-10-CM | POA: Diagnosis not present

## 2021-09-18 DIAGNOSIS — Z825 Family history of asthma and other chronic lower respiratory diseases: Secondary | ICD-10-CM | POA: Diagnosis not present

## 2021-09-18 DIAGNOSIS — Z85118 Personal history of other malignant neoplasm of bronchus and lung: Secondary | ICD-10-CM | POA: Diagnosis not present

## 2021-09-18 HISTORY — DX: Chronic obstructive pulmonary disease with (acute) exacerbation: J44.1

## 2021-09-18 HISTORY — DX: Shortness of breath: R06.02

## 2021-09-18 LAB — COMPREHENSIVE METABOLIC PANEL
ALT: 19 U/L (ref 0–44)
AST: 17 U/L (ref 15–41)
Albumin: 3.6 g/dL (ref 3.5–5.0)
Alkaline Phosphatase: 89 U/L (ref 38–126)
Anion gap: 10 (ref 5–15)
BUN: 12 mg/dL (ref 8–23)
CO2: 22 mmol/L (ref 22–32)
Calcium: 9.3 mg/dL (ref 8.9–10.3)
Chloride: 101 mmol/L (ref 98–111)
Creatinine, Ser: 0.57 mg/dL (ref 0.44–1.00)
GFR, Estimated: >60 mL/min (ref >60)
Glucose, Bld: 166 mg/dL — ABNORMAL HIGH (ref 70–99)
Potassium: 4 mmol/L (ref 3.5–5.1)
Sodium: 133 mmol/L — ABNORMAL LOW (ref 135–145)
Total Bilirubin: 0.9 mg/dL (ref 0.3–1.2)
Total Protein: 7.8 g/dL (ref 6.5–8.1)

## 2021-09-18 LAB — COMPREHENSIVE METABOLIC PANEL WITH GFR
ALT: 18 U/L (ref 0–44)
AST: 18 U/L (ref 15–41)
Albumin: 3.4 g/dL — ABNORMAL LOW (ref 3.5–5.0)
Alkaline Phosphatase: 89 U/L (ref 38–126)
Anion gap: 10 (ref 5–15)
BUN: 13 mg/dL (ref 8–23)
CO2: 24 mmol/L (ref 22–32)
Calcium: 8.8 mg/dL — ABNORMAL LOW (ref 8.9–10.3)
Chloride: 98 mmol/L (ref 98–111)
Creatinine, Ser: 0.71 mg/dL (ref 0.44–1.00)
GFR, Estimated: >60 mL/min
Glucose, Bld: 132 mg/dL — ABNORMAL HIGH (ref 70–99)
Potassium: 3.8 mmol/L (ref 3.5–5.1)
Sodium: 132 mmol/L — ABNORMAL LOW (ref 135–145)
Total Bilirubin: 1 mg/dL (ref 0.3–1.2)
Total Protein: 7.4 g/dL (ref 6.5–8.1)

## 2021-09-18 LAB — CBC
HCT: 40.4 % (ref 36.0–46.0)
HCT: 41.8 % (ref 36.0–46.0)
Hemoglobin: 13.2 g/dL (ref 12.0–15.0)
Hemoglobin: 13.9 g/dL (ref 12.0–15.0)
MCH: 28.7 pg (ref 26.0–34.0)
MCH: 29.1 pg (ref 26.0–34.0)
MCHC: 32.7 g/dL (ref 30.0–36.0)
MCHC: 33.3 g/dL (ref 30.0–36.0)
MCV: 87.4 fL (ref 80.0–100.0)
MCV: 87.8 fL (ref 80.0–100.0)
Platelets: 285 10*3/uL (ref 150–400)
Platelets: 294 10*3/uL (ref 150–400)
RBC: 4.6 MIL/uL (ref 3.87–5.11)
RBC: 4.78 MIL/uL (ref 3.87–5.11)
RDW: 12.3 % (ref 11.5–15.5)
RDW: 12.5 % (ref 11.5–15.5)
WBC: 10.9 10*3/uL — ABNORMAL HIGH (ref 4.0–10.5)
WBC: 8.7 10*3/uL (ref 4.0–10.5)
nRBC: 0 % (ref 0.0–0.2)
nRBC: 0 % (ref 0.0–0.2)

## 2021-09-18 LAB — BLOOD GAS, VENOUS
Acid-base deficit: 0.3 mmol/L (ref 0.0–2.0)
Bicarbonate: 23.1 mmol/L (ref 20.0–28.0)
O2 Saturation: 87.4 %
Patient temperature: 98.6
pCO2, Ven: 35.5 mmHg — ABNORMAL LOW (ref 44.0–60.0)
pH, Ven: 7.428 (ref 7.250–7.430)
pO2, Ven: 53.1 mmHg — ABNORMAL HIGH (ref 32.0–45.0)

## 2021-09-18 LAB — RESP PANEL BY RT-PCR (FLU A&B, COVID) ARPGX2
Influenza A by PCR: NEGATIVE
Influenza B by PCR: NEGATIVE
SARS Coronavirus 2 by RT PCR: NEGATIVE

## 2021-09-18 LAB — MAGNESIUM: Magnesium: 2.2 mg/dL (ref 1.7–2.4)

## 2021-09-18 LAB — TROPONIN I (HIGH SENSITIVITY)
Troponin I (High Sensitivity): 6 ng/L (ref <18)
Troponin I (High Sensitivity): 6 ng/L (ref <18)

## 2021-09-18 LAB — HIV ANTIBODY (ROUTINE TESTING W REFLEX): HIV Screen 4th Generation wRfx: NONREACTIVE

## 2021-09-18 LAB — BRAIN NATRIURETIC PEPTIDE: B Natriuretic Peptide: 83.9 pg/mL (ref 0.0–100.0)

## 2021-09-18 LAB — PROTIME-INR
INR: 0.9 (ref 0.8–1.2)
Prothrombin Time: 12.4 seconds (ref 11.4–15.2)

## 2021-09-18 LAB — PROCALCITONIN: Procalcitonin: <0.1 ng/mL

## 2021-09-18 LAB — PHOSPHORUS: Phosphorus: 3.2 mg/dL (ref 2.5–4.6)

## 2021-09-18 MED ORDER — PROPRANOLOL HCL ER 60 MG PO CP24
60.0000 mg | ORAL_CAPSULE | Freq: Every day | ORAL | Status: DC
  Filled 2021-09-18: qty 1

## 2021-09-18 MED ORDER — PAROXETINE HCL 20 MG PO TABS
40.0000 mg | ORAL_TABLET | Freq: Every day | ORAL | Status: DC
  Administered 2021-09-18 – 2021-09-20 (×3): 40 mg via ORAL
  Filled 2021-09-18 (×3): qty 2

## 2021-09-18 MED ORDER — BENZONATATE 100 MG PO CAPS: 100.0000 mg | ORAL_CAPSULE | Freq: Three times a day (TID) | ORAL | Status: DC | PRN

## 2021-09-18 MED ORDER — MELATONIN 5 MG PO TABS
5.0000 mg | ORAL_TABLET | Freq: Every day | ORAL | Status: AC
  Administered 2021-09-18: 22:00:00 5 mg via ORAL
  Filled 2021-09-18: qty 1

## 2021-09-18 MED ORDER — METHYLPREDNISOLONE SODIUM SUCC 125 MG IJ SOLR
125.0000 mg | Freq: Once | INTRAMUSCULAR | Status: AC
  Administered 2021-09-18: 125 mg via INTRAVENOUS
  Filled 2021-09-18: qty 2

## 2021-09-18 MED ORDER — SODIUM CHLORIDE 0.9 % IV SOLN: Freq: Once | INTRAVENOUS | Status: AC

## 2021-09-18 MED ORDER — PANTOPRAZOLE SODIUM 40 MG PO TBEC
40.0000 mg | DELAYED_RELEASE_TABLET | Freq: Every day | ORAL | Status: DC
  Administered 2021-09-18 – 2021-09-20 (×3): 40 mg via ORAL
  Filled 2021-09-18 (×3): qty 1

## 2021-09-18 MED ORDER — ACETAMINOPHEN 650 MG RE SUPP: 650.0000 mg | Freq: Four times a day (QID) | RECTAL | Status: DC | PRN

## 2021-09-18 MED ORDER — IPRATROPIUM-ALBUTEROL 0.5-2.5 (3) MG/3ML IN SOLN
3.0000 mL | Freq: Once | RESPIRATORY_TRACT | Status: AC
  Administered 2021-09-18: 3 mL via RESPIRATORY_TRACT
  Filled 2021-09-18: qty 3

## 2021-09-18 MED ORDER — IPRATROPIUM-ALBUTEROL 0.5-2.5 (3) MG/3ML IN SOLN
3.0000 mL | Freq: Four times a day (QID) | RESPIRATORY_TRACT | Status: DC
  Administered 2021-09-18 – 2021-09-20 (×9): 3 mL via RESPIRATORY_TRACT
  Filled 2021-09-18 (×10): qty 3

## 2021-09-18 MED ORDER — IOHEXOL 350 MG/ML SOLN
75.0000 mL | Freq: Once | INTRAVENOUS | Status: AC | PRN
  Administered 2021-09-18: 02:00:00 75 mL via INTRAVENOUS

## 2021-09-18 MED ORDER — METHYLPREDNISOLONE SODIUM SUCC 125 MG IJ SOLR
80.0000 mg | Freq: Two times a day (BID) | INTRAMUSCULAR | Status: DC
  Administered 2021-09-18: 08:00:00 80 mg via INTRAVENOUS
  Filled 2021-09-18: qty 2

## 2021-09-18 MED ORDER — ACETAMINOPHEN 325 MG PO TABS: 650.0000 mg | ORAL_TABLET | Freq: Four times a day (QID) | ORAL | Status: DC | PRN

## 2021-09-18 MED ORDER — TRAZODONE HCL 100 MG PO TABS
100.0000 mg | ORAL_TABLET | Freq: Every evening | ORAL | Status: DC | PRN
  Administered 2021-09-18 – 2021-09-19 (×2): 100 mg via ORAL
  Filled 2021-09-18 (×2): qty 1

## 2021-09-18 MED ORDER — ORAL CARE MOUTH RINSE
15.0000 mL | Freq: Two times a day (BID) | OROMUCOSAL | Status: DC
  Administered 2021-09-18 – 2021-09-20 (×4): 15 mL via OROMUCOSAL

## 2021-09-18 MED ORDER — SODIUM CHLORIDE 0.9 % IV SOLN
500.0000 mg | INTRAVENOUS | Status: DC
  Administered 2021-09-18 – 2021-09-20 (×3): 500 mg via INTRAVENOUS
  Filled 2021-09-18 (×3): qty 5

## 2021-09-18 MED ORDER — PROPRANOLOL HCL ER 60 MG PO CP24
60.0000 mg | ORAL_CAPSULE | Freq: Every day | ORAL | Status: DC
  Administered 2021-09-18 – 2021-09-20 (×3): 60 mg via ORAL
  Filled 2021-09-18 (×3): qty 1

## 2021-09-18 MED ORDER — ALBUTEROL SULFATE (2.5 MG/3ML) 0.083% IN NEBU: 2.5000 mg | INHALATION_SOLUTION | RESPIRATORY_TRACT | Status: DC | PRN

## 2021-09-18 MED ORDER — METHYLPREDNISOLONE SODIUM SUCC 40 MG IJ SOLR
40.0000 mg | Freq: Two times a day (BID) | INTRAMUSCULAR | Status: DC
  Administered 2021-09-18 – 2021-09-20 (×4): 40 mg via INTRAVENOUS
  Filled 2021-09-18 (×4): qty 1

## 2021-09-18 MED ORDER — MELATONIN 5 MG PO TABS
5.0000 mg | ORAL_TABLET | Freq: Every day | ORAL | Status: AC
  Administered 2021-09-18: 23:00:00 5 mg via ORAL
  Filled 2021-09-18: qty 1

## 2021-09-18 NOTE — H&P (Signed)
History and Physical    PLEASE NOTE THAT DRAGON DICTATION SOFTWARE WAS USED IN THE CONSTRUCTION OF THIS NOTE.   [name] LNL:[id number] DOB: June 27, 1956 DOA: 09/17/2021  PCP: [name], MD  Patient coming from: home   I have personally briefly reviewed patient's old medical records in [place]  Chief Complaint: Shortness of breath  HPI: [name] is a 65 y.o. 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   The patient reports 2 to 3 days of progressive shortness of breath worsening nonproductive cough. enies any associated orthopnea, PND, or new onset peripheral edema. No recent chest pain, diaphoresis, palpitations, N/V, pre-syncope, or syncope.  Denies any associated wheezing or hemoptysis.  No preceding trauma or travel.  Denies any associated subjective fever, chills, rigors, or generalized myalgias.  No sore throat, abdominal pain, diarrhea, or rash.  She confirms a history of COPD, in the setting of being a former smoker, having completely quit smoking in 2019 after previously smoking proximately 1 pack/day for at least the preceding 40 years.  Per chart review, most recent pulmonary function tests appear to have occurred in March 2021 and were notable for FEV1 to FVC ratio of 0.39, with FEV1 being 40% of predicted.  She reports good compliance with home respiratory regimen which includes scheduled Symbicort, scheduled ipratropium nebulizer, and prn albuterol nebulizer.  Over the last 2 days, she reports increased frequency of use of her rescue inhaler, as any significant provement respiratory status with this measure.  Denies any known baseline supplemental oxygen  requirements.  Additionally, per chart review, she has history of stage I adenocarcinoma of the lung, diagnosed in June 2019, status post partial left upper lobectomy performed at that time.     ED Course:  Vital signs in the ED were notable for the following: Afebrile; heart rate 86-100, blood pressure 105/67 -134/73; respiratory rate 18-26; initial O2 sats noted to be 80 to 81% on room air, subsequently improving to 96 to 97% on 3 L nasal cannula.  Labs were notable for the following: CMP notable for the following: Sodium 132 compared to most recent prior value of 131 on 09/04/2021, bicarbonate 24, creatinine 0.71, glucose 132, calcium corrected for mild hypoalbuminemia 9.2 albumin 3.4, otherwise liver enzymes were found to be within normal limits.  BNP 84, high-sensitivity troponin I x2 values were both noted to be 6.  CBC notable for white blood cell count 10,900, hemoglobin 13.2.  INR 0.9.  COVID-19/influenza PCR were checked in the ED today, with results currently pending.  Imaging and additional notable ED work-up: EKG, which was noted to be via E ASI drive leads demonstrated sinus tachycardia with heart rate 100, T wave versions in inferior but no evidence of ST changes, including no evidence of ST elevation.  Chest x-ray, in comparison to most recent prior from 09/04/2021 showed small stable bilateral pleural effusions and no evidence of acute cardiopulmonary process.  CTA chest showed no evidence of acute pulmonary embolism, but showed a right upper lobe airspace opacity presenting atelectasis versus infiltrate, but otherwise showed no evidence of acute cardiopulmonary process, including no evidence of edema, pleural effusion, or pneumothorax.  While in the ED, the following were administered: Duo nebulizer treatment  x1, Solu-Medrol 125 mg IV x1.  Socially, the patient was admitted to the PCU for further evaluation and management of presenting acute COPD exacerbation complicated by acute  hypoxic respiratory distress.     Review of Systems: As per HPI otherwise 10 point review of systems negative.   Past Medical History:  Diagnosis Date   Anxiety    Arthritis of hand 04/17/2017   Bronchitis    Depression    Dyspnea    Emphysema, unspecified ([place])    per patient mild case   Family history of adverse reaction to anesthesia    patient states sister has a hard time waking up from anesthesia.    Hepatitis C    lung ca dx'd 03/2018   Tremor, unspecified    non-specific; takes inderal.     Past Surgical History:  Procedure Laterality Date   ABDOMINAL HYSTERECTOMY  1986   menorrhagia; hx of cryo for abnormal paps   APPENDECTOMY     removed in 1986 per pt   INTERCOSTAL NERVE BLOCK Right 12/18/2019   Procedure: Intercostal Nerve Block;  Surgeon: Melrose Nakayama, MD;  Location: [place];  Service: Thoracic;  Laterality: Right;   LOBECTOMY Left 03/13/2018   Procedure: Lingula section of left upper lobe lobectomy;  Surgeon: Melrose Nakayama, MD;  Location: [place];  Service: Thoracic;  Laterality: Left;   NODE DISSECTION  12/18/2019   Procedure: Node Dissection;  Surgeon: Melrose Nakayama, MD;  Location: [place];  Service: Thoracic;;   VIDEO ASSISTED THORACOSCOPY (VATS)/WEDGE RESECTION Left 03/13/2018   Procedure: VIDEO ASSISTED THORACOSCOPY (VATS)/WEDGE RESECTION;  Surgeon: Melrose Nakayama, MD;  Location: [facility] OR;  Service: Thoracic;  Laterality: Left;    Social History:  reports that she quit smoking about 3 years ago. Her smoking use included cigarettes. She has a 40.00 pack-year smoking history. She has never used smokeless tobacco. She reports current alcohol use of about 7.0 standard drinks per week. She reports that she does not currently use drugs.   Allergies  Allergen Reactions   Aspirin Shortness Of Breath   Morphine Sulfate Other (See Comments)    RED STREAKS ON ARMS.   Oxycodone Itching and Rash   Glycopyrrolate     UNSPECIFIED REACTION  pt  unsure if she is allergic.   Lactose Intolerance (Gi) Nausea And Vomiting    Milk only.Patient states that she can eat cheese.   Penicillins Other (See Comments)     Heartburn, upset stomach only & tolerated AMOXIL after this reaction Has patient had a PCN reaction causing immediate rash, facial/tongue/throat swelling, SOB or lightheadedness with hypotension: No Has patient had a PCN reaction causing severe rash involving mucus membranes or skin necrosis: No Has patient had a PCN reaction that required hospitalization: No Has patient had a PCN reaction occurring within the last 10 years: #  #  #  YES  #  #  #    Sulfa Antibiotics Tinitus    Family History  Problem Relation Age of Onset   Asthma Mother    Alzheimer's disease Mother 83   Hypertension Mother    Other Father        shot    Neuropathy Sister        face   Cancer Maternal Aunt        breast   Cancer Paternal Aunt    Cancer Paternal Grandmother        abdominal   Lung cancer Paternal Uncle  Family history reviewed and not pertinent    Prior to Admission medications   Medication Sig Start Date End Date Taking? Authorizing Provider  albuterol (PROVENTIL) (2.5 MG/3ML) 0.083% nebulizer solution Take 3 mLs (2.5 mg total) by nebulization every 6 (six) hours as needed for wheezing or shortness of breath. 06/01/21   Koberlein, Steele Berg, MD  albuterol (VENTOLIN HFA) 108 (90 Base) MCG/ACT inhaler Inhale 2 puffs into the lungs every 6 (six) hours as needed for wheezing or shortness of breath. 05/03/21   Koberlein, Steele Berg, MD  benzonatate (TESSALON) 100 MG capsule Take 1 capsule (100 mg total) by mouth every 8 (eight) hours. 08/18/21   Dorie Rank, MD  budesonide-formoterol [facility] 160-4.5 MCG/ACT inhaler Take 2 puffs first thing in am and then another 2 puffs about 12 hours later. 09/07/21   Tanda Rockers, MD  ipratropium (ATROVENT) 0.02 % nebulizer solution Take 2.5 mLs (0.5 mg total) by nebulization 4 (four) times daily.  06/02/21   [name], MD  Melatonin 10 MG TABS Take 10 mg by mouth at bedtime.    [provider]  omeprazole (PRILOSEC) 20 MG capsule Take 20 mg by mouth daily.    [provider]  PARoxetine (PAXIL) 40 MG tablet Take 1 tablet (40 mg total) by mouth daily. 05/03/21   [name], MD  propranolol ER (INDERAL LA) 60 MG 24 hr capsule Take 1 capsule (60 mg total) by mouth daily. 05/31/21   [name], MD  traZODone (DESYREL) 100 MG tablet Take 1 tablet (100 mg total) by mouth at bedtime as needed for sleep. 09/04/21   [name], MD     Objective    Physical Exam: Vitals:   09/18/21 0100 09/18/21 0200 09/18/21 0300 09/18/21 0400  BP: 120/63 134/73 105/67 105/85  Pulse: 93 92 91 85  Resp: 20 18 (!) 22 (!) 24  Temp:      TempSrc:      SpO2: 96% 96% 97% 97%  Weight:      Height:        General: appears to be stated age; alert, oriented; mildly increased work of breathing noted Skin: warm, dry, no rash Head:  AT/[place] Mouth:  Oral mucosa membranes appear moist, normal dentition Neck: supple; trachea midline Heart:  RRR; did not appreciate any M/R/G Lungs: CTAB, did not appreciate any wheezes, rales, or rhonchi Abdomen: + BS; soft, ND, NT Vascular: 2+ pedal pulses b/l; 2+ radial pulses b/l Extremities: no peripheral edema, no muscle wasting Neuro: strength and sensation intact in upper and lower extremities b/l    Labs on Admission: I have personally reviewed following labs and imaging studies  CBC: Recent Labs  Lab 09/18/21 0020  WBC 10.9*  HGB 13.2  HCT 40.4  MCV 87.8  PLT 244   Basic Metabolic Panel: Recent Labs  Lab 09/18/21 0020  NA 132*  K 3.8  CL 98  CO2 24  GLUCOSE 132*  BUN 13  CREATININE 0.71  CALCIUM 8.8*   GFR: Estimated Creatinine Clearance: 59.7 mL/min (by C-G formula based on SCr of 0.71 mg/dL). Liver Function Tests: Recent Labs  Lab 09/18/21 0020  AST 18  ALT 18  ALKPHOS 89  BILITOT 1.0   PROT 7.4  ALBUMIN 3.4*   No results for input(s): LIPASE, AMYLASE in the last 168 hours. No results for input(s): AMMONIA in the last 168 hours. Coagulation Profile: Recent Labs  Lab 09/18/21 0020  INR 0.9   Cardiac Enzymes:  No results for input(s): CKTOTAL, CKMB, CKMBINDEX, TROPONINI in the last 168 hours. BNP (last 3 results) No results for input(s): PROBNP in the last 8760 hours. HbA1C: No results for input(s): HGBA1C in the last 72 hours. CBG: No results for input(s): GLUCAP in the last 168 hours. Lipid Profile: No results for input(s): CHOL, HDL, LDLCALC, TRIG, CHOLHDL, LDLDIRECT in the last 72 hours. Thyroid Function Tests: No results for input(s): TSH, T4TOTAL, FREET4, T3FREE, THYROIDAB in the last 72 hours. Anemia Panel: No results for input(s): VITAMINB12, FOLATE, FERRITIN, TIBC, IRON, RETICCTPCT in the last 72 hours. Urine analysis:    Component Value Date/Time   COLORURINE YELLOW 12/14/2019 0839   APPEARANCEUR CLEAR 12/14/2019 0839   LABSPEC 1.013 12/14/2019 0839   PHURINE 5.0 12/14/2019 0839   GLUCOSEU NEGATIVE 12/14/2019 0839   HGBUR NEGATIVE 12/14/2019 [place] 12/14/2019 0839   BILIRUBINUR n 04/30/2014 1109   KETONESUR NEGATIVE 12/14/2019 0839   PROTEINUR NEGATIVE 12/14/2019 0839   UROBILINOGEN 0.2 04/30/2014 1109   NITRITE NEGATIVE 12/14/2019 0839   LEUKOCYTESUR NEGATIVE 12/14/2019 0839    Radiological Exams on Admission: CT Angio Chest Pulmonary Embolism (PE) W or WO Contrast  Result Date: 09/18/2021 CLINICAL DATA:  Shortness of breath and dizziness. EXAM: CT ANGIOGRAPHY CHEST WITH CONTRAST TECHNIQUE: Multidetector CT imaging of the chest was performed using the standard protocol during bolus administration of intravenous contrast. Multiplanar CT image reconstructions and MIPs were obtained to evaluate the vascular anatomy. CONTRAST:  57mL OMNIPAQUE IOHEXOL 350 MG/ML SOLN COMPARISON:  April 10, 2021 FINDINGS: Cardiovascular:  Satisfactory opacification of the pulmonary arteries to the segmental level. No evidence of pulmonary embolism. Normal heart size. No pericardial effusion. Mediastinum/Nodes: No enlarged mediastinal, hilar, or axillary lymph nodes. Thyroid gland, trachea, and esophagus demonstrate no significant findings. Lungs/Pleura: Centrilobular and paraseptal emphysematous lung disease is seen with mild posterior right upper lobe atelectasis and/or infiltrate. Surgical sutures are seen along the posteromedial aspect of the left upper lobe and posteromedial aspect of the right lower lobe. The 7 mm non-solid right upper lobe lung nodule seen on the prior study is not clearly visualized on the current exam. There is no evidence of a pleural effusion or pneumothorax. Upper Abdomen: No acute abnormality. Musculoskeletal: Multilevel degenerative changes seen throughout the thoracic spine. Review of the MIP images confirms the above findings. IMPRESSION: 1. No evidence of pulmonary embolism. 2. Centrilobular and paraseptal emphysematous lung disease with stable bilateral postoperative changes. 3. Mild, posterior right upper lobe atelectasis and/or infiltrate. Emphysema (ICD10-J43.9). Electronically Signed   By: Virgina Norfolk M.D.   On: 09/18/2021 03:18   DG Chest Port 1 View  Result Date: 09/18/2021 CLINICAL DATA:  Shortness of breath and dizziness. EXAM: PORTABLE CHEST 1 VIEW COMPARISON:  September 04, 2021 FINDINGS: There is no evidence of acute infiltrate. Small, stable bilateral pleural effusions are seen. No pneumothorax is identified. The heart size and mediastinal contours are within normal limits. The visualized skeletal structures are unremarkable. IMPRESSION: Small, stable bilateral pleural effusions. Electronically Signed   By: Virgina Norfolk M.D.   On: 09/18/2021 00:24     EKG: Independently reviewed, with result as described above.    Assessment/Plan    Principal Problem:   Acute exacerbation of  chronic obstructive pulmonary disease (COPD) (HCC) Active Problems:   GERD   SOB (shortness of breath)   Anxiety      #) Acute COPD exacerbation: in the context of a documented history of COPD, Gold Stage 3 per most  recent PFTs in March 2021, diagnosis of acute exacerbation on the basis of to 3 days of progressive shortness of breath, worsening nonproductive cough, increased work of breathing, and finding of acute hypoxic respiratory distress in the absence of any baseline supplemental oxygen requirements.  CTA chest showed no evidence of acute PE, but did show evidence of right upper lobe airspace opacity concerning for atelectasis versus infiltrate, presenting potential contributory factor leading to the acute COPD exacerbation.  We will further evaluate by checking procalcitonin to guide additional antibiotics.  Otherwise, CTA chest showed no evidence of acute cardiac process, including no evidence of edema, effusion, or pneumothorax.  Of note, COVID-19/influenza PCR results are pending at this time. Patient confirms that she is a former smoker and conveys good compliance with outpatient respiratory regimen, as further outlined above.    Plan: monitor continuous pulse oxymetry. Monitor on telemetry. Solumedrol. Scheduled duonebs q6 hours. Prn albuterol inhaler. BMP in the morning. Repeat CBC in the morning. Check serum Mg and Phos levels.  Check VBG. Will start azithromycin for benefit of shortened duration of hospitalization associated with antibiotic initiation in the setting of acute COPD exacerbation. Add-on procalcitonin, with plan to add Rocephin if this value is elevated.  Follow-up results of COVID-19/influenza PCR.  Flutter valve, incentive spirometry.        #) Acute hypoxic respiratory distress: in the context of acute respiratory symptoms and no known baseline supplemental O2 requirements, presenting O2 sat in the low 80s on room air, subsequently improving continue 96 to 97% on  3 L nasal cannula, thereby meeting criteria for acute hypoxic respiratory distress as opposed to acute hypoxic respiratory failure at this time. Appears to be on basis of acute COPD exacerbation and requiring further evaluation of CT chest evidence of right upper lobe airspace opacity concerning for atelectasis versus infiltrate.  Otherwise, no CTA evidence of acute cardiopulmonary process, including no evidence of acute PE.  No clinical or radiographic evidence to suggest acutely decompensated heart failure.  In terms of other considered etiologies, ACS appears less likely at this time in the absence of any recent CP and in the context of negative troponin.  Presenting EKG showed some T wave changes in inferior leads, but was noted to be via E ASI derived leads.  I have ordered a standard twelve-lead EKG for comparison to most recent prior EKG from 09/05/2021. Will also continue to trend troponin.   Plan: further evaluation/management of presenting acute COPD exacerbation, as above, including scheduled duo nebulizers, prn albuterol nebulizer, Solu-Medrol.  Add on procalcitonin, as above . monitor continuous pulse ox with prn supplemental O2 to maintain O2 sats greater than or equal to 92%. monitor on telemetry. CMP/CBC in the AM. Check serum Mg and Phos levels. Check blood gas. Flutter valve, incentive spirometry.  Continue to trend troponin.  Follow for results COVID-19/Influenza PCR .  Standard 12-lead EKG.      #) Generalized anxiety disorder: Documented history of such, on Paxil and propanolol as an outpatient.  will hold propanolol for now as it is a nonselective beta-blocker, which could potentially have negative implications for patient's respiratory status d/t crossover Beta-2 antagonism.   Plan: Continue home Paxil, but hold propanolol for now, as described above.       #) GERD: documented h/o such; on omeprazole as outpatient.   Plan: continue home PPI.        DVT prophylaxis:  SCD's   Code Status: Full code Family Communication: none Disposition Plan: Per Rounding  Team Consults called: none;  Admission status: Inpatient; PCU  Warrants inpatient status on basis of requiring further evaluation and management of presenting acute COPD exacerbation with associated supplemental oxygen requirement with progression of symptoms and development of acute hypoxic respiratory distress in spite of good compliance with outpatient respiratory regimen as well as increasing use of prn albuterol inhaler at home.    PLEASE NOTE THAT DRAGON DICTATION SOFTWARE WAS USED IN THE CONSTRUCTION OF THIS NOTE.   [place] DO [facility] [place]   09/18/2021, 4:26 AM

## 2021-09-18 NOTE — ED Provider Notes (Signed)
[facility] Emergency Department Provider Note MRN:  [id number]  Arrival date & time: 09/18/21     Chief Complaint   Shortness of Breath   History of Present Illness   [name] is a 65 y.o. year-old female with a history of COPD, lung cancer, hepatitis C presenting to the ED with chief complaint of shortness of breath.  Patient has been experiencing frequent bronchitis or pneumonia over the past few months.  Worsening shortness of breath over the past 2 or 3 days.  Increased cough.  Nuys fever.  No chest pain, no abdominal pain, no leg pain or swelling.  Review of Systems  A complete 10 system review of systems was obtained and all systems are negative except as noted in the HPI and PMH.   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    Hepatitis C    lung ca dx'd 03/2018   Tremor, unspecified    non-specific; takes inderal.     Past Surgical History:  Procedure Laterality Date   ABDOMINAL HYSTERECTOMY  1986   menorrhagia; hx of cryo for abnormal paps   APPENDECTOMY     removed in 1986 per pt   INTERCOSTAL NERVE BLOCK Right 12/18/2019   Procedure: Intercostal Nerve Block;  Surgeon: Melrose Nakayama, MD;  Location: [facility] OR;  Service: Thoracic;  Laterality: Right;   LOBECTOMY Left 03/13/2018   Procedure: Lingula section of left upper lobe lobectomy;  Surgeon: Melrose Nakayama, MD;  Location: [facility] OR;  Service: Thoracic;  Laterality: Left;   NODE DISSECTION  12/18/2019   Procedure: Node Dissection;  Surgeon: Melrose Nakayama, MD;  Location: [facility];  Service: Thoracic;;   VIDEO ASSISTED THORACOSCOPY (VATS)/WEDGE RESECTION Left 03/13/2018   Procedure: VIDEO ASSISTED  THORACOSCOPY (VATS)/WEDGE RESECTION;  Surgeon: Melrose Nakayama, MD;  Location: [facility] OR;  Service: Thoracic;  Laterality: Left;    Family History  Problem Relation Age of Onset   Asthma Mother    Alzheimer's disease Mother 53   Hypertension Mother    Other Father        shot    Neuropathy Sister        face   Cancer Maternal Aunt        breast   Cancer Paternal Aunt    Cancer Paternal Grandmother        abdominal   Lung cancer Paternal Uncle     Social History   Socioeconomic History   Marital status: Married    Spouse name: Not on file   Number of children: Not on file   Years of education: Not on file   Highest education level: 12th grade  Occupational History   Not on file  Tobacco Use   Smoking status: Former    Packs/day: 1.00    Years: 40.00    Pack years: 40.00    Types: Cigarettes    Quit date: 12/14/2017    Years since quitting: 3.7   Smokeless tobacco: Never  Vaping Use   Vaping Use: Never used  Substance and Sexual Activity   Alcohol use: Yes    Alcohol/week: 7.0 standard  drinks    Types: 7 Shots of liquor per week    Comment: occasional bourbon   Drug use: Not Currently   Sexual activity: Never  Other Topics Concern   Not on file  Social History Narrative   Not on file   Social Determinants of Health   Financial Resource Strain: Low Risk    Difficulty of Paying Living Expenses: Not hard at all  Food Insecurity: No Food Insecurity   Worried About Charity fundraiser in the Last Year: Never true   [place] in the Last Year: Never true  Transportation Needs: No Transportation Needs   Lack of Transportation (Medical): No   Lack of Transportation (Non-Medical): No  Physical Activity: Sufficiently Active   Days of Exercise per Week: 7 days   Minutes of Exercise per Session: 60 min  Stress: No Stress Concern Present   Feeling of Stress : Only a little  Social Connections: Moderately Isolated   Frequency of Communication with Friends and  Family: More than three times a week   Frequency of Social Gatherings with Friends and Family: Once a week   Attends Religious Services: Never   Marine scientist or Organizations: No   Attends Music therapist: Not on file   Marital Status: Married  Human resources officer Violence: Not on file     Physical Exam   Vitals:   09/18/21 0500 09/18/21 0600  BP: 127/74 117/69  Pulse: 86 79  Resp: (!) 24 17  Temp:    SpO2: 99% 99%    CONSTITUTIONAL: Chronically ill-appearing, NAD NEURO:  Alert and oriented x 3, no focal deficits EYES:  eyes equal and reactive ENT/NECK:  no LAD, no JVD CARDIO: Regular rate, well-perfused, normal S1 and S2 PULM:  CTAB no wheezing or rhonchi, tachypneic GI/GU:  normal bowel sounds, non-distended, non-tender MSK/SPINE:  No gross deformities, no edema SKIN:  no rash, atraumatic PSYCH:  Appropriate speech and behavior  *Additional and/or pertinent findings included in MDM below  Diagnostic and Interventional Summary    EKG Interpretation  Date/Time:  Monday September 18 2021 00:17:10 EST Ventricular Rate:  100 PR Interval:  150 QRS Duration: 87 QT Interval:  353 QTC Calculation: 456 R Axis:     Text Interpretation: [facility] Derived Leads Confirmed by Gerlene Fee [phone]) on 09/18/2021 12:48:40 AM       Labs Reviewed  CBC - Abnormal; Notable for the following components:      Result Value   WBC 10.9 (*)    All other components within normal limits  COMPREHENSIVE METABOLIC PANEL - Abnormal; Notable for the following components:   Sodium 132 (*)    Glucose, Bld 132 (*)    Calcium 8.8 (*)    Albumin 3.4 (*)    All other components within normal limits  COMPREHENSIVE METABOLIC PANEL - Abnormal; Notable for the following components:   Sodium 133 (*)    Glucose, Bld 166 (*)    All other components within normal limits  BLOOD GAS, VENOUS - Abnormal; Notable for the following components:   pCO2, Ven 35.5 (*)    pO2, Ven 53.1 (*)     All other components within normal limits  RESP PANEL BY RT-PCR (FLU A&B, COVID) ARPGX2  BRAIN NATRIURETIC PEPTIDE  PROTIME-INR  PHOSPHORUS  MAGNESIUM  CBC  HIV ANTIBODY (ROUTINE TESTING W REFLEX)  PROCALCITONIN  TROPONIN I (HIGH SENSITIVITY)  TROPONIN I (HIGH SENSITIVITY)    CT Angio Chest Pulmonary Embolism (  PE) W or WO Contrast  Final Result    DG Chest Port 1 View  Final Result      Medications  acetaminophen (TYLENOL) tablet 650 mg (has no administration in time range)    Or  acetaminophen (TYLENOL) suppository 650 mg (has no administration in time range)  azithromycin (ZITHROMAX) 500 mg in sodium chloride 0.9 % 250 mL IVPB ( Intravenous Rate/Dose Change 09/18/21 0603)  ipratropium-albuterol (DUONEB) 0.5-2.5 (3) MG/3ML nebulizer solution 3 mL (has no administration in time range)  albuterol (PROVENTIL) (2.5 MG/3ML) 0.083% nebulizer solution 2.5 mg (has no administration in time range)  methylPREDNISolone sodium succinate (SOLU-MEDROL) 125 mg/2 mL injection 80 mg (has no administration in time range)  benzonatate (TESSALON) capsule 100 mg (has no administration in time range)  pantoprazole (PROTONIX) EC tablet 40 mg (has no administration in time range)  PARoxetine (PAXIL) tablet 40 mg (has no administration in time range)  traZODone (DESYREL) tablet 100 mg (has no administration in time range)  ipratropium-albuterol (DUONEB) 0.5-2.5 (3) MG/3ML nebulizer solution 3 mL (3 mLs Nebulization Given 09/18/21 0026)  methylPREDNISolone sodium succinate (SOLU-MEDROL) 125 mg/2 mL injection 125 mg (125 mg Intravenous Given 09/18/21 0028)  iohexol (OMNIPAQUE) 350 MG/ML injection 75 mL (75 mLs Intravenous Contrast Given 09/18/21 0224)  0.9 %  sodium chloride infusion ( Intravenous New Bag/Given 09/18/21 0521)     Procedures  /  Critical Care .Critical Care Performed by: Maudie Flakes, MD Authorized by: Maudie Flakes, MD   Critical care provider statement:    Critical care time  (minutes):  35   Critical care was necessary to treat or prevent imminent or life-threatening deterioration of the following conditions:  Respiratory failure   Critical care was time spent personally by me on the following activities:  Development of treatment plan with patient or surrogate, discussions with consultants, evaluation of patient's response to treatment, examination of patient, ordering and review of laboratory studies, ordering and review of radiographic studies, ordering and performing treatments and interventions, pulse oximetry, re-evaluation of patient's condition and review of old charts  ED Course and Medical Decision Making  I have reviewed the triage vital signs, the nursing notes, and pertinent available records from the EMR.  Listed above are laboratory and imaging tests that I personally ordered, reviewed, and interpreted and then considered in my medical decision making (see below for details).  Hypoxic respiratory failure, DDx includes COPD exacerbation, PE, heart failure, pneumothorax, recurrence of lung cancer.  Awaiting labs, CTA.  Currently on 4 L nasal cannula, will need admission.       Barth Kirks. Sedonia Small, MD [place] [email or web address]  Final Clinical Impressions(s) / ED Diagnoses     ICD-10-CM   1. COPD exacerbation ([place])  J44.1       ED Discharge Orders     None        Discharge Instructions Discussed with and Provided to Patient:   Discharge Instructions   None       Maudie Flakes, MD 09/18/21 [phone]

## 2021-09-18 NOTE — ED Notes (Signed)
Pt sitting up in bed, pt talking in full short sentences, resps even and unlabored, pt has no requests at this time.

## 2021-09-18 NOTE — ED Notes (Signed)
ED TO INPATIENT HANDOFF REPORT  ED Nurse Name and Phone #: Salvatore Decent Name/Age/Gender [name] 65 y.o. female Room/Bed: WA09/WA09  Code Status   Code Status: Full Code  Home/SNF/Other Home Patient oriented to: self, place, time, and situation Is this baseline? Yes   Triage Complete: Triage complete  Chief Complaint Acute exacerbation of chronic obstructive pulmonary disease (COPD) ([place]) [J44.1]  Triage Note Several week history of shortness of breath and dizziness since diagnosis of pneumonia and bronchitis. EMS reports oxygen sats 88% on room air.   Allergies Allergies  Allergen Reactions   Aspirin Shortness Of Breath   Morphine Sulfate Other (See Comments)    RED STREAKS ON ARMS.   Oxycodone Itching and Rash   Glycopyrrolate     UNSPECIFIED REACTION  pt unsure if she is allergic.   Lactose Intolerance (Gi) Nausea And Vomiting    Milk only.Patient states that she can eat cheese.   Penicillins Other (See Comments)     Heartburn, upset stomach only & tolerated AMOXIL after this reaction Has patient had a PCN reaction causing immediate rash, facial/tongue/throat swelling, SOB or lightheadedness with hypotension: No Has patient had a PCN reaction causing severe rash involving mucus membranes or skin necrosis: No Has patient had a PCN reaction that required hospitalization: No Has patient had a PCN reaction occurring within the last 10 years: #  #  #  YES  #  #  #    Sulfa Antibiotics Tinitus    Level of Care/Admitting Diagnosis ED Disposition     ED Disposition  Admit   Condition  --   [place]: [place] [[id number]]  Level of Care: Progressive [102]  Admit to Progressive based on following criteria: MULTISYSTEM THREATS such as stable sepsis, metabolic/electrolyte imbalance with or without encephalopathy that is responding to early treatment.  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    Hepatitis C    lung ca dx'd 03/2018   Tremor, unspecified    non-specific; takes inderal.    Past Surgical History:  Procedure Laterality Date   ABDOMINAL HYSTERECTOMY  1986   menorrhagia; hx of cryo for abnormal paps   APPENDECTOMY     removed in 1986 per pt   INTERCOSTAL NERVE BLOCK Right 12/18/2019   Procedure: Intercostal Nerve Block;  Surgeon: Melrose Nakayama, MD;  Location: [facility] OR;  Service: Thoracic;  Laterality: Right;   LOBECTOMY Left 03/13/2018   Procedure: Lingula section of left upper lobe lobectomy;  Surgeon: Melrose Nakayama, MD;  Location: [facility] OR;  Service: Thoracic;  Laterality: Left;   NODE DISSECTION  12/18/2019   Procedure: Node Dissection;  Surgeon: Melrose Nakayama, MD;  Location: [place];  Service: Thoracic;;   VIDEO ASSISTED THORACOSCOPY (VATS)/WEDGE RESECTION Left 03/13/2018   Procedure: VIDEO ASSISTED THORACOSCOPY (VATS)/WEDGE RESECTION;  Surgeon: Melrose Nakayama, MD;  Location: [place];  Service: Thoracic;  Laterality: Left;     A IV Location/Drains/Wounds Patient Lines/Drains/Airways Status     Active Line/Drains/Airways     Name Placement date Placement time Site Days   Peripheral IV  09/18/21 20 G Anterior;Right Forearm 09/18/21  0322  Forearm  less than 1   Incision (Closed) 03/13/18 Chest Left 03/13/18  1051  -- 1285   Incision (Closed) 12/18/19 Chest Right 12/18/19  1020  -- 640            Intake/Output Last 24 hours  Intake/Output Summary (Last 24 hours) at 09/18/2021 1501 Last data filed at 09/18/2021 0732 [place] per 24 hour  Intake 254.96 ml  Output 900 ml  Net -645.04 ml    Labs/Imaging Results for orders placed or performed during the hospital encounter of 09/17/21 (from the past 48 hour(s))  CBC     Status: Abnormal   Collection Time: 09/18/21 12:20 AM  Result Value Ref Range   WBC 10.9 (H) 4.0 - 10.5 K/uL   RBC 4.60 3.87 - 5.11 MIL/uL   Hemoglobin 13.2 12.0 - 15.0 g/dL   HCT 40.4 36.0 - 46.0 %   MCV 87.8 80.0 - 100.0 fL   MCH 28.7 26.0 - 34.0 pg   MCHC 32.7 30.0 - 36.0 g/dL   RDW 12.3 11.5 - 15.5 %   Platelets 294 150 - 400 K/uL   nRBC 0.0 0.0 - 0.2 %    Comment: Performed at [facility], [place] [address] [place], [place] [address]  Comprehensive metabolic panel     Status: Abnormal   Collection Time: 09/18/21 12:20 AM  Result Value Ref Range   Sodium 132 (L) 135 - 145 mmol/L   Potassium 3.8 3.5 - 5.1 mmol/L   Chloride 98 98 - 111 mmol/L   CO2 24 22 - 32 mmol/L   Glucose, Bld 132 (H) 70 - 99 mg/dL    Comment: Glucose reference range applies only to samples taken after fasting for at least 8 hours.   BUN 13 8 - 23 mg/dL   Creatinine, Ser 0.71 0.44 - 1.00 mg/dL   Calcium 8.8 (L) 8.9 - 10.3 mg/dL   Total Protein 7.4 6.5 - 8.1 g/dL   Albumin 3.4 (L) 3.5 - 5.0 g/dL   AST 18 15 - 41 U/L   ALT 18 0 - 44 U/L   Alkaline Phosphatase 89 38 - 126 U/L   Total Bilirubin 1.0 0.3 - 1.2 mg/dL   GFR, Estimated >60 >60 mL/min    Comment: (NOTE) Calculated using the CKD-EPI Creatinine Equation (2021)    Anion gap 10 5 - 15    Comment: Performed at [facility], [place] [address] [place], [place] [address]   Troponin I (High Sensitivity)     Status: None   Collection Time: 09/18/21 12:20 AM  Result Value Ref Range   Troponin I (High Sensitivity) 6 <18 ng/L    Comment: (NOTE) Elevated high sensitivity troponin I (hsTnI) values and significant  changes across serial measurements may suggest ACS but many other  chronic and acute conditions are known to elevate hsTnI results.  Refer to the "Links" section for chest pain algorithms and additional  guidance. Performed at [facility], [place]  [address], [place], [place] [address]   Brain natriuretic peptide     Status: None   Collection Time: 09/18/21 12:20 AM  Result Value Ref Range   B Natriuretic Peptide 83.9 0.0 - 100.0 pg/mL    Comment: Performed at [facility], [place] [address], [place], [place] [address]  Protime-INR     Status: None   Collection Time: 09/18/21 12:20 AM  Result Value Ref Range   Prothrombin Time 12.4 11.4 - 15.2 seconds   INR 0.9 0.8 - 1.2    Comment: (NOTE) INR goal varies based on device and disease states. Performed at [facility], [place] [address], [place], [place] [address]   Troponin I (High Sensitivity)     Status: None   Collection Time: 09/18/21  2:05 AM  Result Value Ref Range   Troponin I (High Sensitivity) 6 <18 ng/L    Comment: (NOTE) Elevated high sensitivity troponin I (hsTnI) values and significant  changes across serial measurements may suggest ACS but many other  chronic and acute conditions are known to elevate hsTnI results.  Refer to the "Links" section for chest pain algorithms and additional  guidance. Performed at [facility], [place] [address], [place], [place] [address]   Resp Panel by RT-PCR (Flu A&B, Covid) Nasopharyngeal Swab     Status: None   Collection Time: 09/18/21  4:23 AM   Specimen: Nasopharyngeal Swab; Nasopharyngeal(NP) swabs in vial transport medium  Result Value Ref Range   SARS Coronavirus 2 by RT  PCR NEGATIVE NEGATIVE    Comment: (NOTE) SARS-CoV-2 target nucleic acids are NOT DETECTED.  The SARS-CoV-2 RNA is generally detectable in upper respiratory specimens during the acute phase of infection. The lowest concentration of SARS-CoV-2 viral copies this assay can detect is 138 copies/mL. A negative result does not preclude SARS-Cov-2 infection and should not be used as the sole basis for treatment or other patient management decisions. A negative result may occur with  improper specimen collection/handling, submission of specimen other than nasopharyngeal swab, presence of viral mutation(s) within the areas targeted by this assay, and inadequate number of viral copies(<138 copies/mL). A negative result must be combined with clinical observations, patient history, and epidemiological information. The expected result is Negative.  Fact Sheet for Patients:  EntrepreneurPulse.com.au  Fact Sheet for Healthcare Providers:  IncredibleEmployment.be  This test is no t yet approved or cleared by the [place] FDA and  has been authorized for detection and/or diagnosis of SARS-CoV-2 by FDA under an Emergency Use Authorization (EUA). This EUA will remain  in effect (meaning this test can be used) for the duration of the COVID-19 declaration under Section 564(b)(1) of the Act, 21 U.S.C.section 360bbb-3(b)(1), unless the authorization is terminated  or revoked sooner.       Influenza A by PCR NEGATIVE NEGATIVE   Influenza B by PCR NEGATIVE NEGATIVE    Comment: (NOTE) The Xpert Xpress SARS-CoV-2/FLU/RSV plus assay is intended as an aid in the diagnosis of influenza from Nasopharyngeal swab specimens and should not be used as a sole basis for treatment. Nasal washings and aspirates are unacceptable for Xpert Xpress SARS-CoV-2/FLU/RSV testing.  Fact Sheet for Patients: EntrepreneurPulse.com.au  Fact Sheet for Healthcare  Providers: IncredibleEmployment.be  This test is not yet approved or cleared by the [place] FDA and has been authorized for detection and/or diagnosis of SARS-CoV-2 by FDA under an Emergency Use Authorization (EUA). This EUA will remain in effect (meaning this test can be used)  for the duration of the COVID-19 declaration under Section 564(b)(1) of the Act, 21 U.S.C. section 360bbb-3(b)(1), unless the authorization is terminated or revoked.  Performed at [facility], [place] [address], [place], [place] [address]   HIV Antibody (routine testing w rflx)     Status: None   Collection Time: 09/18/21  5:23 AM  Result Value Ref Range   HIV Screen 4th Generation wRfx Non Reactive Non Reactive    Comment: Performed at [facility], [place] [address] [place], [place] [address]  Phosphorus     Status: None   Collection Time: 09/18/21  5:23 AM  Result Value Ref Range   Phosphorus 3.2 2.5 - 4.6 mg/dL    Comment: Performed at [facility], [place] [address] [place], [place] [address]  Magnesium     Status: None   Collection Time: 09/18/21  5:23 AM  Result Value Ref Range   Magnesium 2.2 1.7 - 2.4 mg/dL    Comment: Performed at [facility], [place] [address], [place], [place] [address]  Comprehensive metabolic panel     Status: Abnormal   Collection Time: 09/18/21  5:23 AM  Result Value Ref Range   Sodium 133 (L) 135 - 145 mmol/L   Potassium 4.0 3.5 - 5.1 mmol/L   Chloride 101 98 - 111 mmol/L   CO2 22 22 - 32 mmol/L   Glucose, Bld 166 (H) 70 - 99 mg/dL    Comment: Glucose reference range applies only to samples taken after fasting for at least 8 hours.   BUN 12 8 - 23 mg/dL   Creatinine, Ser 0.57 0.44 - 1.00 mg/dL   Calcium 9.3 8.9 - 10.3 mg/dL   Total Protein 7.8 6.5 - 8.1 g/dL   Albumin 3.6 3.5 - 5.0 g/dL   AST 17 15 - 41 U/L   ALT 19 0 - 44 U/L   Alkaline Phosphatase 89 38 - 126 U/L   Total Bilirubin 0.9  0.3 - 1.2 mg/dL   GFR, Estimated >60 >60 mL/min    Comment: (NOTE) Calculated using the CKD-EPI Creatinine Equation (2021)    Anion gap 10 5 - 15    Comment: Performed at [facility], [place] [address] [place], [place] [address]  CBC     Status: None   Collection Time: 09/18/21  5:23 AM  Result Value Ref Range   WBC 8.7 4.0 - 10.5 K/uL   RBC 4.78 3.87 - 5.11 MIL/uL   Hemoglobin 13.9 12.0 - 15.0 g/dL   HCT 41.8 36.0 - 46.0 %   MCV 87.4 80.0 - 100.0 fL   MCH 29.1 26.0 - 34.0 pg   MCHC 33.3 30.0 - 36.0 g/dL   RDW 12.5 11.5 - 15.5 %   Platelets 285 150 - 400 K/uL   nRBC 0.0 0.0 - 0.2 %    Comment: Performed at [facility], [place] [address] [place],  [address]  Procalcitonin - Baseline     Status: None   Collection Time: 09/18/21  5:23 AM  Result Value Ref Range   Procalcitonin <0.10 ng/mL    Comment:        Interpretation: PCT (Procalcitonin) <= 0.5 ng/mL: Systemic infection (sepsis) is not likely. Local bacterial infection is possible. (NOTE)       Sepsis PCT Algorithm           Lower Respiratory Tract  Infection PCT Algorithm    ----------------------------     ----------------------------         PCT < 0.25 ng/mL                PCT < 0.10 ng/mL          Strongly encourage             Strongly discourage   discontinuation of antibiotics    initiation of antibiotics    ----------------------------     -----------------------------       PCT 0.25 - 0.50 ng/mL            PCT 0.10 - 0.25 ng/mL               OR       >80% decrease in PCT            Discourage initiation of                                            antibiotics      Encourage discontinuation           of antibiotics    ----------------------------     -----------------------------         PCT >= 0.50 ng/mL              PCT 0.26 - 0.50 ng/mL               AND        <80% decrease in PCT             Encourage initiation of                                              antibiotics       Encourage continuation           of antibiotics    ----------------------------     -----------------------------        PCT >= 0.50 ng/mL                  PCT > 0.50 ng/mL               AND         increase in PCT                  Strongly encourage                                      initiation of antibiotics    Strongly encourage escalation           of antibiotics                                     -----------------------------                                           PCT <= 0.25 ng/mL  OR                                        > 80% decrease in PCT                                      Discontinue / Do not initiate                                             antibiotics  Performed at [place] [address] [place], [place] [address]   Blood gas, venous     Status: Abnormal   Collection Time: 09/18/21  5:23 AM  Result Value Ref Range   pH, Ven 7.428 7.250 - 7.430   pCO2, Ven 35.5 (L) 44.0 - 60.0 mmHg   pO2, Ven 53.1 (H) 32.0 - 45.0 mmHg   Bicarbonate 23.1 20.0 - 28.0 mmol/L   Acid-base deficit 0.3 0.0 - 2.0 mmol/L   O2 Saturation 87.4 %   Patient temperature 98.6     Comment: Performed at [facility], [place] [address], [place], [place] [address]   CT Angio Chest Pulmonary Embolism (PE) W or WO Contrast  Result Date: 09/18/2021 CLINICAL DATA:  Shortness of breath and dizziness. EXAM: CT ANGIOGRAPHY CHEST WITH CONTRAST TECHNIQUE: Multidetector CT imaging of the chest was performed using the standard protocol during bolus administration of intravenous contrast. Multiplanar CT image reconstructions and MIPs were obtained to evaluate the vascular anatomy. CONTRAST:  7mL OMNIPAQUE IOHEXOL 350 MG/ML SOLN COMPARISON:  April 10, 2021 FINDINGS: Cardiovascular: Satisfactory opacification of the pulmonary arteries to the segmental level. No  evidence of pulmonary embolism. Normal heart size. No pericardial effusion. Mediastinum/Nodes: No enlarged mediastinal, hilar, or axillary lymph nodes. Thyroid gland, trachea, and esophagus demonstrate no significant findings. Lungs/Pleura: Centrilobular and paraseptal emphysematous lung disease is seen with mild posterior right upper lobe atelectasis and/or infiltrate. Surgical sutures are seen along the posteromedial aspect of the left upper lobe and posteromedial aspect of the right lower lobe. The 7 mm non-solid right upper lobe lung nodule seen on the prior study is not clearly visualized on the current exam. There is no evidence of a pleural effusion or pneumothorax. Upper Abdomen: No acute abnormality. Musculoskeletal: Multilevel degenerative changes seen throughout the thoracic spine. Review of the MIP images confirms the above findings. IMPRESSION: 1. No evidence of pulmonary embolism. 2. Centrilobular and paraseptal emphysematous lung disease with stable bilateral postoperative changes. 3. Mild, posterior right upper lobe atelectasis and/or infiltrate. Emphysema (ICD10-J43.9). Electronically Signed   By: Virgina Norfolk M.D.   On: 09/18/2021 03:18   DG Chest Port 1 View  Result Date: 09/18/2021 CLINICAL DATA:  Shortness of breath and dizziness. EXAM: PORTABLE CHEST 1 VIEW COMPARISON:  September 04, 2021 FINDINGS: There is no evidence of acute infiltrate. Small, stable bilateral pleural effusions are seen. No pneumothorax is identified. The heart size and mediastinal contours are within normal limits. The visualized skeletal structures are unremarkable. IMPRESSION: Small, stable bilateral pleural effusions. Electronically Signed   By: Virgina Norfolk M.D.   On: 09/18/2021 00:24    Pending Labs Unresulted Labs (From admission, onward)     Start  Ordered   09/19/21 0947  Basic metabolic panel  Tomorrow morning,   R        09/18/21 1033   09/19/21 0500  CBC  Tomorrow morning,   R         09/18/21 1033   09/19/21 0500  Magnesium  Tomorrow morning,   R        09/18/21 1033            Vitals/Pain Today's Vitals   09/18/21 1300 09/18/21 1315 09/18/21 1321 09/18/21 1500  BP: (!) 132/55 116/62  (!) 128/53  Pulse: 89 89  89  Resp: (!) 28 18  20   Temp:  (!) 97.5 F (36.4 C)  98 F (36.7 C)  TempSrc:  Oral  Oral  SpO2: 98% 99%  98%  Weight:      Height:      PainSc:  0-No pain 0-No pain 0-No pain    Isolation Precautions No active isolations  Medications Medications  acetaminophen (TYLENOL) tablet 650 mg (has no administration in time range)    Or  acetaminophen (TYLENOL) suppository 650 mg (has no administration in time range)  azithromycin (ZITHROMAX) 500 mg in sodium chloride 0.9 % 250 mL IVPB (0 mg Intravenous Stopped 09/18/21 0732)  ipratropium-albuterol (DUONEB) 0.5-2.5 (3) MG/3ML nebulizer solution 3 mL (3 mLs Nebulization Given 09/18/21 1320)  albuterol (PROVENTIL) (2.5 MG/3ML) 0.083% nebulizer solution 2.5 mg (has no administration in time range)  benzonatate (TESSALON) capsule 100 mg (has no administration in time range)  pantoprazole (PROTONIX) EC tablet 40 mg (40 mg Oral Given 09/18/21 0951)  PARoxetine (PAXIL) tablet 40 mg (40 mg Oral Given 09/18/21 0951)  traZODone (DESYREL) tablet 100 mg (has no administration in time range)  methylPREDNISolone sodium succinate (SOLU-MEDROL) 40 mg/mL injection 40 mg (has no administration in time range)  propranolol ER (INDERAL LA) 24 hr capsule 60 mg (has no administration in time range)  ipratropium-albuterol (DUONEB) 0.5-2.5 (3) MG/3ML nebulizer solution 3 mL (3 mLs Nebulization Given 09/18/21 0026)  methylPREDNISolone sodium succinate (SOLU-MEDROL) 125 mg/2 mL injection 125 mg (125 mg Intravenous Given 09/18/21 0028)  iohexol (OMNIPAQUE) 350 MG/ML injection 75 mL (75 mLs Intravenous Contrast Given 09/18/21 0224)  0.9 %  sodium chloride infusion (0 mLs Intravenous Stopped 09/18/21 0732)    Mobility walks         R Recommendations: See Admitting Provider Note  Report given to:   Additional Notes:

## 2021-09-18 NOTE — Progress Notes (Deleted)
Same day note  Patient seen and examined at bedside.  Patient was admitted to the hospital for shortness of breath  At the time of my evaluation, patient complains of dyspnea on minimal exertion, shortness of breath.  Denies less chest  discomfort.  Follows up with Dr. Melvyn Novas pulmonary as outpatient.  Physical examination reveals diminished breath sounds bilaterally.  Laboratory data and imaging was reviewed  Assessment and Plan.  Acute hypoxic respiratory failure secondary to COPD exacerbation:   CTA chest showed no evidence of acute PE, but did show evidence of right upper lobe airspace opacity concerning for atelectasis versus infiltrate. On 3 L of oxygen by nasal cannula at this time.  COVID and influenza swab negative.  History of former smoking.  Continue duo nebs, Solu-Medrol, azithromycin.  Procalcitonin was less than 0.10.  We will continue Zithromax for now.  Continue flutter valve, incentive spirometry.  Will wean oxygen as able.  Troponins were negative.  Highly likely that the patient might need oxygen on discharge.  I have put pulmonary rehabilitation for discharge in the computer.  History of the lung cancer status post partial lobectomy.  Was not on oxygen at home.  Patient follows up with Dr. Melvyn Novas pulmonary as outpatient    Generalized anxiety disorder:  Continue Paxil.  Was on propranolol currently on hold.      GERD: Continue PPI  No Charge  Signed,  Delila Pereyra, MD [facility]

## 2021-09-18 NOTE — Progress Notes (Addendum)
Same day note  Patient seen and examined at bedside.  Patient was admitted to the hospital for shortness of breath  At the time of my evaluation, patient complains of dyspnea on minimal exertion, shortness of breath.  Denies less chest  discomfort.  Follows up with Dr. Melvyn Novas pulmonary as outpatient.  Physical examination reveals diminished breath sounds bilaterally.  Left basal area crackles noted.  Laboratory data and imaging was reviewed  Assessment and Plan.  Acute hypoxic respiratory failure secondary to COPD exacerbation:   CTA chest showed no evidence of acute PE, but did show evidence of right upper lobe airspace opacity concerning for atelectasis versus infiltrate. On 3 L of oxygen by nasal cannula at this time.  COVID and influenza swab negative.  History of former smoking.  Continue duo nebs, Solu-Medrol, azithromycin.  Procalcitonin was less than 0.10.  We will continue Zithromax for now.  Continue flutter valve, incentive spirometry.  Will wean oxygen as able.  Troponins were negative.  Highly likely that the patient might need oxygen on discharge.  I have put pulmonary rehabilitation for discharge in the computer.  History of the lung cancer status post partial lobectomy.  Was not on oxygen at home.  Patient follows up with Dr. Melvyn Novas pulmonary as outpatient    Generalized anxiety disorder:  Continue Paxil.  Patient wishes to resume propranolol.   GERD: Continue PPI  No Charge  Signed,  Delila Pereyra, MD [facility]

## 2021-09-19 LAB — MAGNESIUM: Magnesium: 2.3 mg/dL (ref 1.7–2.4)

## 2021-09-19 LAB — CBC
HCT: 41 % (ref 36.0–46.0)
Hemoglobin: 13.2 g/dL (ref 12.0–15.0)
MCH: 28.9 pg (ref 26.0–34.0)
MCHC: 32.2 g/dL (ref 30.0–36.0)
MCV: 89.9 fL (ref 80.0–100.0)
Platelets: 318 10*3/uL (ref 150–400)
RBC: 4.56 MIL/uL (ref 3.87–5.11)
RDW: 12.6 % (ref 11.5–15.5)
WBC: 12.8 10*3/uL — ABNORMAL HIGH (ref 4.0–10.5)
nRBC: 0 % (ref 0.0–0.2)

## 2021-09-19 LAB — BASIC METABOLIC PANEL
Anion gap: 8 (ref 5–15)
BUN: 21 mg/dL (ref 8–23)
CO2: 25 mmol/L (ref 22–32)
Calcium: 9.7 mg/dL (ref 8.9–10.3)
Chloride: 106 mmol/L (ref 98–111)
Creatinine, Ser: 0.83 mg/dL (ref 0.44–1.00)
GFR, Estimated: >60 mL/min (ref >60)
Glucose, Bld: 148 mg/dL — ABNORMAL HIGH (ref 70–99)
Potassium: 4.9 mmol/L (ref 3.5–5.1)
Sodium: 139 mmol/L (ref 135–145)

## 2021-09-19 MED ORDER — MELATONIN 3 MG PO TABS
3.0000 mg | ORAL_TABLET | Freq: Once | ORAL | Status: AC
  Administered 2021-09-19: 23:00:00 3 mg via ORAL
  Filled 2021-09-19: qty 1

## 2021-09-19 NOTE — Plan of Care (Signed)

## 2021-09-19 NOTE — Progress Notes (Signed)
PROGRESS NOTE    [name]  ZOX:[id number] DOB: December 07, 1955 DOA: 09/17/2021 PCP: Patricia Macadam, MD    Brief Narrative:  This 65 years old female with PMH significant for COPD Gold stage III, stage I adenocarcinoma of lung diagnosed in June 2019, GERD, GAD presented in the ED with acute hypoxic respiratory failure requiring supplemental oxygen.  Patient is admitted for COPD exacerbation.  Assessment & Plan:   Principal Problem:   Acute exacerbation of chronic obstructive pulmonary disease (COPD) (HCC) Active Problems:   GERD   SOB (shortness of breath)   Anxiety  Acute hypoxic respiratory failure secondary to COPD exacerbation: Patient presented with worsening shortness of breath. CTA chest ruled out PE CT chest showed right upper lobe airspace opacity concerning for atelectasis versus infiltrate Patient is requiring 3 L of supplemental oxygen to keep saturation above 94%. Influenza and COVID swab negative. Procalcitonin less than 1.0 Continue Solu-Medrol and DuoNeb scheduled and as needed. Continue Zithromax for COPD exacerbation Check oxygen while ambulation.  Generalized anxiety disorder: Continue Paxil.  GERD: Continue omeprazole.   DVT prophylaxis: SCDS Code Status: Full code Family Communication: No family at bedside Disposition Plan:    Status is: Inpatient  Remains inpatient appropriate because: Admitted for COPD requiring DuoNeb nebulization and IV Solu-Medrol.  Anticipated discharge home in 1 to 2 days.  Consultants:  None  Procedures: None  Antimicrobials:  Anti-infectives (From admission, onward)    Start     Dose/Rate Route Frequency Ordered Stop   09/18/21 0500  azithromycin (ZITHROMAX) 500 mg in sodium chloride 0.9 % 250 mL IVPB        500 mg 250 mL/hr over 60 Minutes Intravenous Every 24 hours 09/18/21 0449         Subjective: Patient was seen and examined at bedside.  Overnight events noted.   She reports feeling much  improved and still feels tight on exam.  Denies any cough  Objective: Vitals:   09/19/21 0500 09/19/21 0807 09/19/21 0809 09/19/21 1315  BP:    (!) 116/53  Pulse:    72  Resp:      Temp:      TempSrc:      SpO2:  95% 95% 100%  Weight: 59.5 kg     Height:        Intake/Output Summary (Last 24 hours) at 09/19/2021 1338 Last data filed at 09/19/2021 0410 Gross per 24 hour  Intake --  Output 425 ml  Net -425 ml   Filed Weights   09/17/21 2348 09/19/21 0500  Weight: 56.7 kg 59.5 kg    Examination:  General exam: Awake, not in any acute distress. Respiratory system: Clear to auscultation, mild bronchospasm, no crackles. Cardiovascular system: S1 & S2 heard, regular rate and rhythm, no murmur Gastrointestinal system: Abdomen is soft, nontender, nondistended, BS+ Central nervous system: Alert and oriented x 2. No focal neurological deficits. Extremities: No edema, no cyanosis, no clubbing. Skin: No rashes, lesions or ulcers Psychiatry: Judgement and insight appear normal. Mood & affect appropriate.     Data Reviewed: I have personally reviewed following labs and imaging studies  CBC: Recent Labs  Lab 09/18/21 0020 09/18/21 0523 09/19/21 0445  WBC 10.9* 8.7 12.8*  HGB 13.2 13.9 13.2  HCT 40.4 41.8 41.0  MCV 87.8 87.4 89.9  PLT 294 285 811   Basic Metabolic Panel: Recent Labs  Lab 09/18/21 0020 09/18/21 0523 09/19/21 0445  NA 132* 133* 139  K 3.8 4.0 4.9  CL 98 101  106  CO2 24 22 25   GLUCOSE 132* 166* 148*  BUN 13 12 21   CREATININE 0.71 0.57 0.83  CALCIUM 8.8* 9.3 9.7  MG  --  2.2 2.3  PHOS  --  3.2  --    GFR: Estimated Creatinine Clearance: 57.6 mL/min (by C-G formula based on SCr of 0.83 mg/dL). Liver Function Tests: Recent Labs  Lab 09/18/21 0020 09/18/21 0523  AST 18 17  ALT 18 19  ALKPHOS 89 89  BILITOT 1.0 0.9  PROT 7.4 7.8  ALBUMIN 3.4* 3.6   No results for input(s): LIPASE, AMYLASE in the last 168 hours. No results for input(s):  AMMONIA in the last 168 hours. Coagulation Profile: Recent Labs  Lab 09/18/21 0020  INR 0.9   Cardiac Enzymes: No results for input(s): CKTOTAL, CKMB, CKMBINDEX, TROPONINI in the last 168 hours. BNP (last 3 results) No results for input(s): PROBNP in the last 8760 hours. HbA1C: No results for input(s): HGBA1C in the last 72 hours. CBG: No results for input(s): GLUCAP in the last 168 hours. Lipid Profile: No results for input(s): CHOL, HDL, LDLCALC, TRIG, CHOLHDL, LDLDIRECT in the last 72 hours. Thyroid Function Tests: No results for input(s): TSH, T4TOTAL, FREET4, T3FREE, THYROIDAB in the last 72 hours. Anemia Panel: No results for input(s): VITAMINB12, FOLATE, FERRITIN, TIBC, IRON, RETICCTPCT in the last 72 hours. Sepsis Labs: Recent Labs  Lab 09/18/21 0523  PROCALCITON <0.10    Recent Results (from the past 240 hour(s))  Resp Panel by RT-PCR (Flu A&B, Covid) Nasopharyngeal Swab     Status: None   Collection Time: 09/18/21  4:23 AM   Specimen: Nasopharyngeal Swab; Nasopharyngeal(NP) swabs in vial transport medium  Result Value Ref Range Status   SARS Coronavirus 2 by RT PCR NEGATIVE NEGATIVE Final    Comment: (NOTE) SARS-CoV-2 target nucleic acids are NOT DETECTED.  The SARS-CoV-2 RNA is generally detectable in upper respiratory specimens during the acute phase of infection. The lowest concentration of SARS-CoV-2 viral copies this assay can detect is 138 copies/mL. A negative result does not preclude SARS-Cov-2 infection and should not be used as the sole basis for treatment or other patient management decisions. A negative result may occur with  improper specimen collection/handling, submission of specimen other than nasopharyngeal swab, presence of viral mutation(s) within the areas targeted by this assay, and inadequate number of viral copies(<138 copies/mL). A negative result must be combined with clinical observations, patient history, and  epidemiological information. The expected result is Negative.  Fact Sheet for Patients:  EntrepreneurPulse.com.au  Fact Sheet for Healthcare Providers:  IncredibleEmployment.be  This test is no t yet approved or cleared by the [place] FDA and  has been authorized for detection and/or diagnosis of SARS-CoV-2 by FDA under an Emergency Use Authorization (EUA). This EUA will remain  in effect (meaning this test can be used) for the duration of the COVID-19 declaration under Section 564(b)(1) of the Act, 21 U.S.C.section 360bbb-3(b)(1), unless the authorization is terminated  or revoked sooner.       Influenza A by PCR NEGATIVE NEGATIVE Final   Influenza B by PCR NEGATIVE NEGATIVE Final    Comment: (NOTE) The Xpert Xpress SARS-CoV-2/FLU/RSV plus assay is intended as an aid in the diagnosis of influenza from Nasopharyngeal swab specimens and should not be used as a sole basis for treatment. Nasal washings and aspirates are unacceptable for Xpert Xpress SARS-CoV-2/FLU/RSV testing.  Fact Sheet for Patients: EntrepreneurPulse.com.au  Fact Sheet for Healthcare Providers: IncredibleEmployment.be  This test  is not yet approved or cleared by the [place] and has been authorized for detection and/or diagnosis of SARS-CoV-2 by FDA under an Emergency Use Authorization (EUA). This EUA will remain in effect (meaning this test can be used) for the duration of the COVID-19 declaration under Section 564(b)(1) of the Act, 21 U.S.C. section 360bbb-3(b)(1), unless the authorization is terminated or revoked.  Performed at [facility], [place] [address], [place], [place] [address]     Radiology Studies: CT Angio Chest Pulmonary Embolism (PE) W or WO Contrast  Result Date: 09/18/2021 CLINICAL DATA:  Shortness of breath and dizziness. EXAM: CT ANGIOGRAPHY CHEST WITH CONTRAST TECHNIQUE:  Multidetector CT imaging of the chest was performed using the standard protocol during bolus administration of intravenous contrast. Multiplanar CT image reconstructions and MIPs were obtained to evaluate the vascular anatomy. CONTRAST:  52mL OMNIPAQUE IOHEXOL 350 MG/ML SOLN COMPARISON:  April 10, 2021 FINDINGS: Cardiovascular: Satisfactory opacification of the pulmonary arteries to the segmental level. No evidence of pulmonary embolism. Normal heart size. No pericardial effusion. Mediastinum/Nodes: No enlarged mediastinal, hilar, or axillary lymph nodes. Thyroid gland, trachea, and esophagus demonstrate no significant findings. Lungs/Pleura: Centrilobular and paraseptal emphysematous lung disease is seen with mild posterior right upper lobe atelectasis and/or infiltrate. Surgical sutures are seen along the posteromedial aspect of the left upper lobe and posteromedial aspect of the right lower lobe. The 7 mm non-solid right upper lobe lung nodule seen on the prior study is not clearly visualized on the current exam. There is no evidence of a pleural effusion or pneumothorax. Upper Abdomen: No acute abnormality. Musculoskeletal: Multilevel degenerative changes seen throughout the thoracic spine. Review of the MIP images confirms the above findings. IMPRESSION: 1. No evidence of pulmonary embolism. 2. Centrilobular and paraseptal emphysematous lung disease with stable bilateral postoperative changes. 3. Mild, posterior right upper lobe atelectasis and/or infiltrate. Emphysema (ICD10-J43.9). Electronically Signed   By: Virgina Norfolk M.D.   On: 09/18/2021 03:18   DG Chest Port 1 View  Result Date: 09/18/2021 CLINICAL DATA:  Shortness of breath and dizziness. EXAM: PORTABLE CHEST 1 VIEW COMPARISON:  September 04, 2021 FINDINGS: There is no evidence of acute infiltrate. Small, stable bilateral pleural effusions are seen. No pneumothorax is identified. The heart size and mediastinal contours are within normal limits.  The visualized skeletal structures are unremarkable. IMPRESSION: Small, stable bilateral pleural effusions. Electronically Signed   By: Virgina Norfolk M.D.   On: 09/18/2021 00:24     Scheduled Meds:  ipratropium-albuterol  3 mL Nebulization Q6H   mouth rinse  15 mL Mouth Rinse BID   methylPREDNISolone (SOLU-MEDROL) injection  40 mg Intravenous Q12H   pantoprazole  40 mg Oral Daily   PARoxetine  40 mg Oral Daily   propranolol ER  60 mg Oral Daily   Continuous Infusions:  azithromycin 500 mg (09/19/21 0540)     LOS: 1 day    Time spent: 35 mins    Eland Lamantia, MD [facility]   If 7PM-7AM, please contact night-coverage

## 2021-09-19 NOTE — TOC Initial Note (Signed)
Transition of Care [facility]) - Initial/Assessment Note    Patient Details  Name: Patricia Patricia MRN: [id number] Date of Birth: [birth year]  Transition of Care [facility]) CM/SW Contact:    Leeroy Cha, RN Phone Number: 09/19/2021, 8:09 AM  Clinical Narrative:                  Transition of Care [facility]) Screening Note   Patient Details  Name: Patricia Patricia Date of Birth: April 25, 1956   Transition of Care [facility]) CM/SW Contact:    Leeroy Cha, RN Phone Number: 09/19/2021, 8:09 AM    Transition of Care Department [facility]) has reviewed patient and no TOC needs have been identified at this time. We will continue to monitor patient advancement through interdisciplinary progression rounds. If new patient transition needs arise, please place a TOC consult.  Plan: from home with husband plan is to return to home  Expected Discharge Plan: Home/Self Care Barriers to Discharge: Continued Medical Work up   Patient Goals and CMS Choice Patient states their goals for this hospitalization and ongoing recovery are:: to gio back home CMS Medicare.gov Compare Post Acute Care list provided to:: Patient    Expected Discharge Plan and Services Expected Discharge Plan: Home/Self Care   Discharge Planning Services: CM Consult   Living arrangements for the past 2 months: Single Family Home                                      Prior Living Arrangements/Services Living arrangements for the past 2 months: Single Family Home Lives with:: Spouse Patient language and need for interpreter reviewed:: Yes Do you feel safe going back to the place where you live?: Yes            Criminal Activity/Legal Involvement Pertinent to Current Situation/Hospitalization: No - Comment as needed  Activities of Daily Living Home Assistive Devices/Equipment: Nebulizer, Eyeglasses ADL Screening (condition at time of admission) Patient's cognitive ability adequate to safely complete daily  activities?: Yes Is the patient deaf or have difficulty hearing?: No Does the patient have difficulty seeing, even when wearing glasses/contacts?: Yes Does the patient have difficulty concentrating, remembering, or making decisions?: No Patient able to express need for assistance with ADLs?: Yes Does the patient have difficulty dressing or bathing?: No Independently performs ADLs?: Yes (appropriate for developmental age) Does the patient have difficulty walking or climbing stairs?: Yes Weakness of Legs: Both Weakness of Arms/Hands: None  Permission Sought/Granted                  Emotional Assessment Appearance:: Appears stated age Attitude/Demeanor/Rapport: Engaged Affect (typically observed): Calm Orientation: : Oriented to Place, Oriented to Self, Oriented to  Time, Oriented to Situation Alcohol / Substance Use: Tobacco Use Psych Involvement: No (comment)  Admission diagnosis:  Acute exacerbation of chronic obstructive pulmonary disease (COPD) ([place]) [J44.1] COPD exacerbation ([place]) [J44.1] COPD mixed type ([place]) [J44.9] Patient Active Problem List   Diagnosis Date Noted   Acute exacerbation of chronic obstructive pulmonary disease (COPD) ([place]) 09/18/2021   SOB (shortness of breath) 09/18/2021   Anxiety    COPD GOLD 3  06/29/2021   Adenocarcinoma of right lung, stage 1 ([place]) 04/11/2020   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   Adenocarcinoma of left lung, 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/25/2019   Lung mass 03/13/2018   Chronic bronchitis ([place]) 02/04/2018   Left upper lobe pulmonary nodule 02/04/2018  Vaccine counseling 11/13/2017   Cirrhosis ([place]) 11/13/2017   Arthritis of hand 04/17/2017   Hepatitis C, chronic ([place]) 03/14/2015   Tobacco use disorder 01/21/2014   Tremor 10/14/2012   Migraine headache 08/01/2010   Depression, major, single episode, moderate ([place]) 03/19/2008   GERD 03/19/2008   NEPHROLITHIASIS, HX OF 03/19/2008   PCP:  [name], MD Pharmacy:    [place], [address] [place] [address] Phone: [phone] Fax: [phone]  [place], [place] [address] Phone: [phone] Fax: [phone]     Social Determinants of Health (SDOH) Interventions    Readmission Risk Interventions No flowsheet data found.

## 2021-09-19 NOTE — Plan of Care (Signed)
  Problem: Clinical Measurements: Goal: Respiratory complications will improve Outcome: Progressing   

## 2021-09-20 ENCOUNTER — Other Ambulatory Visit: Payer: Self-pay | Admitting: Family Medicine

## 2021-09-20 ENCOUNTER — Encounter: Payer: Self-pay | Admitting: Family Medicine

## 2021-09-20 DIAGNOSIS — R062 Wheezing: Secondary | ICD-10-CM

## 2021-09-20 DIAGNOSIS — J410 Simple chronic bronchitis: Secondary | ICD-10-CM

## 2021-09-20 DIAGNOSIS — R059 Cough, unspecified: Secondary | ICD-10-CM

## 2021-09-20 LAB — CBC
HCT: 38 % (ref 36.0–46.0)
Hemoglobin: 12.3 g/dL (ref 12.0–15.0)
MCH: 29.1 pg (ref 26.0–34.0)
MCHC: 32.4 g/dL (ref 30.0–36.0)
MCV: 90 fL (ref 80.0–100.0)
Platelets: 322 10*3/uL (ref 150–400)
RBC: 4.22 MIL/uL (ref 3.87–5.11)
RDW: 12.6 % (ref 11.5–15.5)
WBC: 10.6 10*3/uL — ABNORMAL HIGH (ref 4.0–10.5)
nRBC: 0 % (ref 0.0–0.2)

## 2021-09-20 MED ORDER — IPRATROPIUM-ALBUTEROL 0.5-2.5 (3) MG/3ML IN SOLN
3.0000 mL | Freq: Three times a day (TID) | RESPIRATORY_TRACT | Status: DC
  Administered 2021-09-20: 14:00:00 3 mL via RESPIRATORY_TRACT
  Filled 2021-09-20: qty 3

## 2021-09-20 MED ORDER — PREDNISONE 20 MG PO TABS: 40.0000 mg | ORAL_TABLET | Freq: Every day | ORAL | 0 refills | Status: AC

## 2021-09-20 NOTE — Discharge Summary (Signed)
Physician Discharge Summary  [name] EXH:[id number] DOB: August 17, 1956 DOA: 09/17/2021  PCP: [name], MD  Admit date: 09/17/2021  Discharge date: 09/20/2021  Admitted From: Home.  Disposition:  Home.  Recommendations for Outpatient Follow-up:  Follow up with PCP in 1-2 weeks. Please obtain BMP/CBC in one week. Advised to take prednisone 40 mg daily for 2 more days to complete 5-day treatment for COPD exacerbation. Patient has completed antibiotics for COPD exacerbation.  Home Health: None Equipment/Devices: Home oxygen therapy.  Discharge Condition: Stable CODE STATUS:Full code Diet recommendation: Heart Healthy   Brief Summary / Hospital Course: This 65 years old female with PMH significant for COPD Gold stage III, stage I adenocarcinoma of lung diagnosed in June 2019, GERD, GAD presented in the ED with acute hypoxic respiratory failure requiring supplemental oxygen. Patient was admitted for COPD exacerbation.  Patient was continued on supplemental oxygen, scheduled and as needed DuoNeb nebulizations and IV Solu-Medrol.  Patient has significantly improved with continued medical management in the hospital.  She has ambulated without oxygen and desatted to 88%.  Patient feels much better,  She has completed antibiotics,  Solu-Medrol transitioned to prednisone.  Home oxygen has been arranged.  Patient is being discharged home.  She was managed for below problems.  Discharge Diagnoses:  Principal Problem:   Acute exacerbation of chronic obstructive pulmonary disease (COPD) ([place]) Active Problems:   GERD   SOB (shortness of breath)   Anxiety  Acute hypoxic respiratory failure secondary to COPD exacerbation: Patient presented with worsening shortness of breath. CTA chest ruled out PE CT chest showed right upper lobe airspace opacity concerning for atelectasis versus infiltrate Patient required 3 L of supplemental oxygen to keep saturation above 94%. Influenza and  COVID swab negative. Procalcitonin less than 1.0 Continue Solu-Medrol and DuoNeb scheduled and as needed. Continue Zithromax for COPD exacerbation. Patient failed ambulatory SPO2 test.  Home oxygen arranged. Patient is being discharged home.   Generalized anxiety disorder: Continue Paxil.   GERD: Continue omeprazole.  Discharge Instructions  Discharge Instructions     Call MD for:  difficulty breathing, headache or visual disturbances   Complete by: As directed    Call MD for:  persistant dizziness or light-headedness   Complete by: As directed    Call MD for:  persistant nausea and vomiting   Complete by: As directed    Diet - low sodium heart healthy   Complete by: As directed    Diet Carb Modified   Complete by: As directed    Discharge instructions   Complete by: As directed    Advised to follow-up with primary care physician in 1 week. Advised to take prednisone 40 mg daily for 2 more days to complete 5-day treatment for COPD exacerbation. Patient has completed antibiotics for COPD exacerbations.   Increase activity slowly   Complete by: As directed    Pulmonary rehab therapeutic exercise   Complete by: As directed       Allergies as of 09/20/2021       Reactions   Aspirin Shortness Of Breath   Morphine Sulfate Other (See Comments)   RED STREAKS ON ARMS.   Oxycodone Itching, Rash   Glycopyrrolate    UNSPECIFIED REACTION  pt unsure if she is allergic.   Lactose Intolerance (gi) Nausea And Vomiting   Milk only.Patient states that she can eat cheese.   Penicillins Other (See Comments)    Heartburn, upset stomach only & tolerated AMOXIL after this reaction Has patient had a  PCN reaction causing immediate rash, facial/tongue/throat swelling, SOB or lightheadedness with hypotension: No Has patient had a PCN reaction causing severe rash involving mucus membranes or skin necrosis: No Has patient had a PCN reaction that required hospitalization: No Has patient had  a PCN reaction occurring within the last 10 years: #  #  #  YES  #  #  #    Sulfa Antibiotics Tinitus        Medication List     TAKE these medications    acetaminophen 500 MG tablet Commonly known as: TYLENOL Take 1,000 mg by mouth every 6 (six) hours as needed for mild pain.   albuterol 108 (90 Base) MCG/ACT inhaler Commonly known as: VENTOLIN HFA Inhale 2 puffs into the lungs every 6 (six) hours as needed for wheezing or shortness of breath. What changed: Another medication with the same name was removed. Continue taking this medication, and follow the directions you see here.   benzonatate 100 MG capsule Commonly known as: TESSALON Take 1 capsule (100 mg total) by mouth every 8 (eight) hours. What changed:  when to take this reasons to take this   budesonide-formoterol 160-4.5 MCG/ACT inhaler Commonly known as: Symbicort Take 2 puffs first thing in am and then another 2 puffs about 12 hours later.   ipratropium 0.02 % nebulizer solution Commonly known as: ATROVENT Take 2.5 mLs (0.5 mg total) by nebulization 4 (four) times daily.   Melatonin 10 MG Tabs Take 40 mg by mouth at bedtime.   omeprazole 20 MG capsule Commonly known as: PRILOSEC Take 20 mg by mouth daily.   PARoxetine 40 MG tablet Commonly known as: PAXIL Take 1 tablet (40 mg total) by mouth daily.   predniSONE 20 MG tablet Commonly known as: DELTASONE Take 2 tablets (40 mg total) by mouth daily for 2 days.   propranolol ER 60 MG 24 hr capsule Commonly known as: INDERAL LA Take 1 capsule (60 mg total) by mouth daily.   traZODone 100 MG tablet Commonly known as: DESYREL Take 1 tablet (100 mg total) by mouth at bedtime as needed for sleep.               Durable Medical Equipment  (From admission, onward)           Start     Ordered   09/20/21 1046  DME Oxygen  Once       Question Answer Comment  Length of Need Lifetime   Mode or (Route) Nasal cannula   Liters per Minute 2    Frequency Continuous (stationary and portable oxygen unit needed)   Oxygen conserving device Yes   Oxygen delivery system Gas      09/20/21 1046            Follow-up Information     Koberlein, Junell C, MD Follow up in 1 week(s).   Specialty: Family Medicine Contact information: [address] [place] [address] [phone]                Allergies  Allergen Reactions   Aspirin Shortness Of Breath   Morphine Sulfate Other (See Comments)    RED STREAKS ON ARMS.   Oxycodone Itching and Rash   Glycopyrrolate     UNSPECIFIED REACTION  pt unsure if she is allergic.   Lactose Intolerance (Gi) Nausea And Vomiting    Milk only.Patient states that she can eat cheese.   Penicillins Other (See Comments)     Heartburn, upset stomach  only & tolerated AMOXIL after this reaction Has patient had a PCN reaction causing immediate rash, facial/tongue/throat swelling, SOB or lightheadedness with hypotension: No Has patient had a PCN reaction causing severe rash involving mucus membranes or skin necrosis: No Has patient had a PCN reaction that required hospitalization: No Has patient had a PCN reaction occurring within the last 10 years: #  #  #  YES  #  #  #    Sulfa Antibiotics Tinitus    Consultations: None   Procedures/Studies: CT Angio Chest Pulmonary Embolism (PE) W or WO Contrast  Result Date: 09/18/2021 CLINICAL DATA:  Shortness of breath and dizziness. EXAM: CT ANGIOGRAPHY CHEST WITH CONTRAST TECHNIQUE: Multidetector CT imaging of the chest was performed using the standard protocol during bolus administration of intravenous contrast. Multiplanar CT image reconstructions and MIPs were obtained to evaluate the vascular anatomy. CONTRAST:  34mL OMNIPAQUE IOHEXOL 350 MG/ML SOLN COMPARISON:  April 10, 2021 FINDINGS: Cardiovascular: Satisfactory opacification of the pulmonary arteries to the segmental level. No evidence of pulmonary embolism. Normal heart size. No  pericardial effusion. Mediastinum/Nodes: No enlarged mediastinal, hilar, or axillary lymph nodes. Thyroid gland, trachea, and esophagus demonstrate no significant findings. Lungs/Pleura: Centrilobular and paraseptal emphysematous lung disease is seen with mild posterior right upper lobe atelectasis and/or infiltrate. Surgical sutures are seen along the posteromedial aspect of the left upper lobe and posteromedial aspect of the right lower lobe. The 7 mm non-solid right upper lobe lung nodule seen on the prior study is not clearly visualized on the current exam. There is no evidence of a pleural effusion or pneumothorax. Upper Abdomen: No acute abnormality. Musculoskeletal: Multilevel degenerative changes seen throughout the thoracic spine. Review of the MIP images confirms the above findings. IMPRESSION: 1. No evidence of pulmonary embolism. 2. Centrilobular and paraseptal emphysematous lung disease with stable bilateral postoperative changes. 3. Mild, posterior right upper lobe atelectasis and/or infiltrate. Emphysema (ICD10-J43.9). Electronically Signed   By: Virgina Norfolk M.D.   On: 09/18/2021 03:18   DG Chest Port 1 View  Result Date: 09/18/2021 CLINICAL DATA:  Shortness of breath and dizziness. EXAM: PORTABLE CHEST 1 VIEW COMPARISON:  September 04, 2021 FINDINGS: There is no evidence of acute infiltrate. Small, stable bilateral pleural effusions are seen. No pneumothorax is identified. The heart size and mediastinal contours are within normal limits. The visualized skeletal structures are unremarkable. IMPRESSION: Small, stable bilateral pleural effusions. Electronically Signed   By: Virgina Norfolk M.D.   On: 09/18/2021 00:24   DG Chest Portable 1 View  Result Date: 09/04/2021 CLINICAL DATA:  Shortness of breath, COPD exacerbation. Lung cancer. EXAM: PORTABLE CHEST 1 VIEW COMPARISON:  08/18/2021 and CT chest 04/10/2021. FINDINGS: Trachea is midline. Heart size normal. Postoperative scarring in the  lungs bilaterally. No airspace consolidation or pleural fluid. IMPRESSION: No acute findings. Electronically Signed   By: Lorin Picket M.D.   On: 09/04/2021 10:50      Subjective: Patient was seen and examined at bedside.  Overnight events noted.   Patient reports feeling much better.  Wheezing has significantly resolved.  Patient needed oxygen. Patient has bee while ambulation.  Patient is being discharged home.  Discharge Exam: Vitals:   09/20/21 0828 09/20/21 1323  BP:  100/84  Pulse:  66  Resp:  16  Temp:    SpO2: 99% 95%   Vitals:   09/20/21 0500 09/20/21 0824 09/20/21 0828 09/20/21 1323  BP:    100/84  Pulse:    66  Resp:  16  Temp:      TempSrc:      SpO2:  99% 99% 95%  Weight: 60.2 kg     Height:        General: Pt is alert, awake, not in acute distress Cardiovascular: RRR, S1/S2 +, no rubs, no gallops Respiratory: CTA bilaterally, no wheezing, no rhonchi Abdominal: Soft, NT, ND, bowel sounds + Extremities: no edema, no cyanosis    The results of significant diagnostics from this hospitalization (including imaging, microbiology, ancillary and laboratory) are listed below for reference.     Microbiology: Recent Results (from the past 240 hour(s))  Resp Panel by RT-PCR (Flu A&B, Covid) Nasopharyngeal Swab     Status: None   Collection Time: 09/18/21  4:23 AM   Specimen: Nasopharyngeal Swab; Nasopharyngeal(NP) swabs in vial transport medium  Result Value Ref Range Status   SARS Coronavirus 2 by RT PCR NEGATIVE NEGATIVE Final    Comment: (NOTE) SARS-CoV-2 target nucleic acids are NOT DETECTED.  The SARS-CoV-2 RNA is generally detectable in upper respiratory specimens during the acute phase of infection. The lowest concentration of SARS-CoV-2 viral copies this assay can detect is 138 copies/mL. A negative result does not preclude SARS-Cov-2 infection and should not be used as the sole basis for treatment or other patient management decisions. A negative  result may occur with  improper specimen collection/handling, submission of specimen other than nasopharyngeal swab, presence of viral mutation(s) within the areas targeted by this assay, and inadequate number of viral copies(<138 copies/mL). A negative result must be combined with clinical observations, patient history, and epidemiological information. The expected result is Negative.  Fact Sheet for Patients:  EntrepreneurPulse.com.au  Fact Sheet for Healthcare Providers:  IncredibleEmployment.be  This test is no t yet approved or cleared by the [place] FDA and  has been authorized for detection and/or diagnosis of SARS-CoV-2 by FDA under an Emergency Use Authorization (EUA). This EUA will remain  in effect (meaning this test can be used) for the duration of the COVID-19 declaration under Section 564(b)(1) of the Act, 21 U.S.C.section 360bbb-3(b)(1), unless the authorization is terminated  or revoked sooner.       Influenza A by PCR NEGATIVE NEGATIVE Final   Influenza B by PCR NEGATIVE NEGATIVE Final    Comment: (NOTE) The Xpert Xpress SARS-CoV-2/FLU/RSV plus assay is intended as an aid in the diagnosis of influenza from Nasopharyngeal swab specimens and should not be used as a sole basis for treatment. Nasal washings and aspirates are unacceptable for Xpert Xpress SARS-CoV-2/FLU/RSV testing.  Fact Sheet for Patients: EntrepreneurPulse.com.au  Fact Sheet for Healthcare Providers: IncredibleEmployment.be  This test is not yet approved or cleared by the [place] FDA and has been authorized for detection and/or diagnosis of SARS-CoV-2 by FDA under an Emergency Use Authorization (EUA). This EUA will remain in effect (meaning this test can be used) for the duration of the COVID-19 declaration under Section 564(b)(1) of the Act, 21 U.S.C. section 360bbb-3(b)(1), unless the authorization is  terminated or revoked.  Performed at [facility], [place] [address], [place], [place] [address]      Labs: BNP (last 3 results) Recent Labs    05/25/21 1117 09/18/21 0020  BNP 134.1* 84.1   Basic Metabolic Panel: Recent Labs  Lab 09/18/21 0020 09/18/21 0523 09/19/21 0445  NA 132* 133* 139  K 3.8 4.0 4.9  CL 98 101 106  CO2 24 22 25   GLUCOSE 132* 166* 148*  BUN 13 12 21  CREATININE 0.71 0.57 0.83  CALCIUM 8.8* 9.3 9.7  MG  --  2.2 2.3  PHOS  --  3.2  --    Liver Function Tests: Recent Labs  Lab 09/18/21 0020 09/18/21 0523  AST 18 17  ALT 18 19  ALKPHOS 89 89  BILITOT 1.0 0.9  PROT 7.4 7.8  ALBUMIN 3.4* 3.6   No results for input(s): LIPASE, AMYLASE in the last 168 hours. No results for input(s): AMMONIA in the last 168 hours. CBC: Recent Labs  Lab 09/18/21 0020 09/18/21 0523 09/19/21 0445 09/20/21 0456  WBC 10.9* 8.7 12.8* 10.6*  HGB 13.2 13.9 13.2 12.3  HCT 40.4 41.8 41.0 38.0  MCV 87.8 87.4 89.9 90.0  PLT 294 285 318 322   Cardiac Enzymes: No results for input(s): CKTOTAL, CKMB, CKMBINDEX, TROPONINI in the last 168 hours. BNP: Invalid input(s): POCBNP CBG: No results for input(s): GLUCAP in the last 168 hours. D-Dimer No results for input(s): DDIMER in the last 72 hours. Hgb A1c No results for input(s): HGBA1C in the last 72 hours. Lipid Profile No results for input(s): CHOL, HDL, LDLCALC, TRIG, CHOLHDL, LDLDIRECT in the last 72 hours. Thyroid function studies No results for input(s): TSH, T4TOTAL, T3FREE, THYROIDAB in the last 72 hours.  Invalid input(s): FREET3 Anemia work up No results for input(s): VITAMINB12, FOLATE, FERRITIN, TIBC, IRON, RETICCTPCT in the last 72 hours. Urinalysis    Component Value Date/Time   COLORURINE YELLOW 12/14/2019 0839   APPEARANCEUR CLEAR 12/14/2019 0839   LABSPEC 1.013 12/14/2019 0839   PHURINE 5.0 12/14/2019 0839   GLUCOSEU NEGATIVE 12/14/2019 0839   HGBUR NEGATIVE 12/14/2019  0839   BILIRUBINUR NEGATIVE 12/14/2019 0839   BILIRUBINUR n 04/30/2014 1109   KETONESUR NEGATIVE 12/14/2019 0839   PROTEINUR NEGATIVE 12/14/2019 0839   UROBILINOGEN 0.2 04/30/2014 1109   NITRITE NEGATIVE 12/14/2019 0839   LEUKOCYTESUR NEGATIVE 12/14/2019 0839   Sepsis Labs Invalid input(s): PROCALCITONIN,  WBC,  LACTICIDVEN Microbiology Recent Results (from the past 240 hour(s))  Resp Panel by RT-PCR (Flu A&B, Covid) Nasopharyngeal Swab     Status: None   Collection Time: 09/18/21  4:23 AM   Specimen: Nasopharyngeal Swab; Nasopharyngeal(NP) swabs in vial transport medium  Result Value Ref Range Status   SARS Coronavirus 2 by RT PCR NEGATIVE NEGATIVE Final    Comment: (NOTE) SARS-CoV-2 target nucleic acids are NOT DETECTED.  The SARS-CoV-2 RNA is generally detectable in upper respiratory specimens during the acute phase of infection. The lowest concentration of SARS-CoV-2 viral copies this assay can detect is 138 copies/mL. A negative result does not preclude SARS-Cov-2 infection and should not be used as the sole basis for treatment or other patient management decisions. A negative result may occur with  improper specimen collection/handling, submission of specimen other than nasopharyngeal swab, presence of viral mutation(s) within the areas targeted by this assay, and inadequate number of viral copies(<138 copies/mL). A negative result must be combined with clinical observations, patient history, and epidemiological information. The expected result is Negative.  Fact Sheet for Patients:  EntrepreneurPulse.com.au  Fact Sheet for Healthcare Providers:  IncredibleEmployment.be  This test is no t yet approved or cleared by the [place] FDA and  has been authorized for detection and/or diagnosis of SARS-CoV-2 by FDA under an Emergency Use Authorization (EUA). This EUA will remain  in effect (meaning this test can be used) for the  duration of the COVID-19 declaration under Section 564(b)(1) of the Act, 21 U.S.C.section 360bbb-3(b)(1), unless the authorization is terminated  or revoked sooner.       Influenza A by PCR NEGATIVE NEGATIVE Final   Influenza B by PCR NEGATIVE NEGATIVE Final    Comment: (NOTE) The Xpert Xpress SARS-CoV-2/FLU/RSV plus assay is intended as an aid in the diagnosis of influenza from Nasopharyngeal swab specimens and should not be used as a sole basis for treatment. Nasal washings and aspirates are unacceptable for Xpert Xpress SARS-CoV-2/FLU/RSV testing.  Fact Sheet for Patients: EntrepreneurPulse.com.au  Fact Sheet for Healthcare Providers: IncredibleEmployment.be  This test is not yet approved or cleared by the [place] FDA and has been authorized for detection and/or diagnosis of SARS-CoV-2 by FDA under an Emergency Use Authorization (EUA). This EUA will remain in effect (meaning this test can be used) for the duration of the COVID-19 declaration under Section 564(b)(1) of the Act, 21 U.S.C. section 360bbb-3(b)(1), unless the authorization is terminated or revoked.  Performed at [facility], [place] [address] [place], [place] [address]      Time coordinating discharge: Over 30 minutes  SIGNED:   Shawna Clamp, MD  [facility] 09/20/2021, 3:56 PM Pager   If 7PM-7AM, please contact night-coverage

## 2021-09-20 NOTE — Discharge Instructions (Signed)
Advised to follow-up with primary care physician in 1 week. Advised to take prednisone 40 mg daily for 2 more days to complete 5-day treatment for COPD exacerbation. Patient has completed antibiotics for COPD exacerbations.

## 2021-09-20 NOTE — TOC Transition Note (Signed)
Transition of Care [facility]) - CM/SW Discharge Note   Patient Details  Name: [name] MRN: [id number] Date of Birth: [birth year]  [facility] CM/SW Contact:  Leeroy Cha, RN Phone Number: 09/20/2021, 10:51 AM   Clinical Narrative:    O2 for home and travel ordered through [employer]   Final next level of care: Home/Self Care Barriers to Discharge: Barriers Resolved   Patient Goals and CMS Choice Patient states their goals for this hospitalization and ongoing recovery are:: to gio back home CMS Medicare.gov Compare Post Acute Care list provided to:: Patient Choice offered to / list presented to : Patient  Discharge Placement                       Discharge Plan and Services   Discharge Planning Services: CM Consult Post Acute Care Choice: Durable Medical Equipment          DME Arranged: Oxygen DME Agency: [employer] Date DME Agency Contacted: 09/20/21 Time DME Agency Contacted: [phone] Representative spoke with at DME Agency: jermaine            Social Determinants of Health ([place]) Interventions     Readmission Risk Interventions No flowsheet data found.

## 2021-09-20 NOTE — Progress Notes (Signed)
Chaplain engaged in an initial visit with [name].  Chaplain and [name] talked about her needs concerning an Scientist, physiological, Healthcare POA.  She desires to have one in place so that her family is not left to make any hard decisions on her behalf.  Chaplain expressed how the POA form can also be used to outline a need for not wanting a healthcare agent and to simply follow the document.  Chaplain let her know how to complete document.   [name] also spent time talking about her life and faith.  She talked about God's divine presence in her life and the ways she has changed due to God.  [name] shared her testimony.  Chaplain provided listening, support, education and presence.    09/20/21 1100  Clinical Encounter Type  Visited With Patient  Visit Type Initial;Spiritual support;Social support

## 2021-09-20 NOTE — Progress Notes (Addendum)
SATURATION QUALIFICATIONS: (This note is used to comply with regulatory documentation for home oxygen)  Patient Saturations on Room Air at Rest = 93%  Patient Saturations on Room Air while Ambulating = 88%  Patient Saturations on 2 Liters of oxygen while Ambulating = 96%  Please briefly explain why patient needs home oxygen: Pt desats when ambulating on room air

## 2021-09-21 ENCOUNTER — Telehealth: Payer: Self-pay

## 2021-09-21 ENCOUNTER — Telehealth: Payer: Self-pay | Admitting: Internal Medicine

## 2021-09-21 NOTE — Telephone Encounter (Signed)
Transition Care Management Follow-up Telephone Call Date of discharge and from where: DC:[facility] 09-20-21 DX: COPD exac  How have you been since you were released from the hospital? Feeling much better  Any questions or concerns? No  Items Reviewed: Did the pt receive and understand the discharge instructions provided? Yes  Medications obtained and verified? Yes  Other? No  Any new allergies since your discharge? No  Dietary orders reviewed? Yes Do you have support at home? Yes   Home Care and Equipment/Supplies: Were home health services ordered? no  Has the agency set up a time to come to the patient's home? not applicable Were any new equipment or medical supplies ordered?  Yes: supplemental oxygen 2L [place]  What is the name of the medical supply agency? [employer]  Were you able to get the supplies/equipment? yes Do you have any questions related to the use of the equipment or supplies? No  Functional Questionnaire: (I = Independent and D = Dependent) ADLs: I  Bathing/Dressing- I  Meal Prep- I  Eating- I  Maintaining continence- I  Transferring/Ambulation- I  Managing Meds- I  Follow up appointments reviewed:  PCP Hospital f/u appt confirmed? Yes  Scheduled to see Dr Ethlyn Gallery on 09-27-21 @ [facility] f/u appt confirmed? No . Are transportation arrangements needed? No  If their condition worsens, is the pt aware to call PCP or go to the Emergency Dept.? Yes Was the patient provided with contact information for the PCP's office or ED? Yes Was to pt encouraged to call back with questions or concerns? Yes

## 2021-09-21 NOTE — Telephone Encounter (Signed)
Called and spoke with pt stating to her to follow instructions stated by the hospital team before she was discharged and then when she has a f/u appt, to go with those instructions stated. Pt verbalized understanding. Pt did not have a HFU appt scheduled so I did make pt an appt. Pt said that she will be receiving her portable O2 12/29 so made sure the f/u was after that date. Nothing further needed.

## 2021-09-21 NOTE — Telephone Encounter (Signed)
Pt states she was just discharged from the hospital with respiratory failure and pneumonia. Pt wanting to know what medication regimen she needs to follow- one being DuoNeb and the other inhalers.Pt is also worried because she was sent home with oxygen, but does not have portable tanks or POC yet so worried about leaving for an appointment. Please advise [phone]

## 2021-09-22 ENCOUNTER — Ambulatory Visit (INDEPENDENT_AMBULATORY_CARE_PROVIDER_SITE_OTHER): Payer: Medicare Other

## 2021-09-22 VITALS — Ht 65.0 in | Wt 125.0 lb

## 2021-09-22 DIAGNOSIS — Z0000 Encounter for general adult medical examination without abnormal findings: Secondary | ICD-10-CM

## 2021-09-22 NOTE — Progress Notes (Signed)
Subjective:   [name] is a 65 y.o. female who presents for Medicare Annual (Subsequent) preventive examination.  Review of Systems    No ROS Cardiac Risk Factors include: advanced age (>64men, >25 women)   Objective:    Today's Vitals   09/22/21 1303  Weight: 125 lb (56.7 kg)  Height: 5\' 5"  (1.651 m)   Body mass index is 20.8 kg/m.  Advanced Directives 09/22/2021 09/18/2021 09/04/2021 08/18/2021 05/25/2021 04/12/2021 12/18/2019  Does Patient Have a Medical Advance Directive? No No No No No No -  Does patient want to make changes to medical advance directive? - - - - - No - Patient declined -  Would patient like information on creating a medical advance directive? No - Patient declined Yes (Inpatient - patient requests chaplain consult to create a medical advance directive) No - Patient declined No - Patient declined No - Patient declined No - Patient declined No - Patient declined    Current Medications (verified) Outpatient Encounter Medications as of 09/22/2021  Medication Sig   acetaminophen (TYLENOL) 500 MG tablet Take 1,000 mg by mouth every 6 (six) hours as needed for mild pain.   albuterol (VENTOLIN HFA) 108 (90 Base) MCG/ACT inhaler Inhale 2 puffs into the lungs every 6 (six) hours as needed for wheezing or shortness of breath.   benzonatate (TESSALON) 100 MG capsule Take 1 capsule (100 mg total) by mouth every 8 (eight) hours. (Patient taking differently: Take 100 mg by mouth 3 (three) times daily as needed for cough.)   budesonide-formoterol (SYMBICORT) 160-4.5 MCG/ACT inhaler Take 2 puffs first thing in am and then another 2 puffs about 12 hours later. (Patient not taking: Reported on 09/21/2021)   guaiFENesin (MUCINEX) 600 MG 12 hr tablet Take 600 mg by mouth 2 (two) times daily as needed for cough or to loosen phlegm.   ipratropium (ATROVENT) 0.02 % nebulizer solution Take 2.5 mLs (0.5 mg total) by nebulization 4 (four) times daily.   Melatonin 10 MG TABS Take 40 mg  by mouth at bedtime.   omeprazole (PRILOSEC) 20 MG capsule Take 20 mg by mouth daily.   PARoxetine (PAXIL) 40 MG tablet Take 1 tablet (40 mg total) by mouth daily.   predniSONE (DELTASONE) 20 MG tablet Take 2 tablets (40 mg total) by mouth daily for 2 days.   propranolol ER (INDERAL LA) 60 MG 24 hr capsule Take 1 capsule (60 mg total) by mouth daily.   traZODone (DESYREL) 100 MG tablet Take 1 tablet (100 mg total) by mouth at bedtime as needed for sleep.   No facility-administered encounter medications on file as of 09/22/2021.    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    Hepatitis C    lung ca dx'd 03/2018   Tremor, unspecified    non-specific; takes inderal.    Past Surgical History:  Procedure Laterality Date   ABDOMINAL HYSTERECTOMY  1986   menorrhagia; hx of cryo for abnormal paps   APPENDECTOMY     removed in 1986 per pt   INTERCOSTAL NERVE BLOCK Right 12/18/2019  Procedure: Intercostal Nerve Block;  Surgeon: Melrose Nakayama, MD;  Location: [facility];  Service: Thoracic;  Laterality: Right;   LOBECTOMY Left 03/13/2018   Procedure: Lingula section of left upper lobe lobectomy;  Surgeon: Melrose Nakayama, MD;  Location: [facility] OR;  Service: Thoracic;  Laterality: Left;   NODE DISSECTION  12/18/2019   Procedure: Node Dissection;  Surgeon: Melrose Nakayama, MD;  Location: [place];  Service: Thoracic;;   VIDEO ASSISTED THORACOSCOPY (VATS)/WEDGE RESECTION Left 03/13/2018   Procedure: VIDEO ASSISTED THORACOSCOPY (VATS)/WEDGE RESECTION;  Surgeon: Melrose Nakayama, MD;   Location: [facility] OR;  Service: Thoracic;  Laterality: Left;   Family History  Problem Relation Age of Onset   Asthma Mother    Alzheimer's disease Mother 7   Hypertension Mother    Other Father        shot    Neuropathy Sister        face   Cancer Maternal Aunt        breast   Cancer Paternal Aunt    Cancer Paternal Grandmother        abdominal   Lung cancer Paternal Uncle    Social History   Socioeconomic History   Marital status: Married    Spouse name: Not on file   Number of children: Not on file   Years of education: Not on file   Highest education level: 12th grade  Occupational History   Not on file  Tobacco Use   Smoking status: Former    Packs/day: 1.00    Years: 40.00    Pack years: 40.00    Types: Cigarettes    Quit date: 12/14/2017    Years since quitting: 3.7   Smokeless tobacco: Never  Vaping Use   Vaping Use: Never used  Substance and Sexual Activity   Alcohol use: Yes    Alcohol/week: 7.0 standard drinks    Types: 7 Shots of liquor per week    Comment: occasional bourbon   Drug use: Not Currently   Sexual activity: Never  Other Topics Concern   Not on file  Social History Narrative   Not on file   Social Determinants of Health   Financial Resource Strain: Low Risk    Difficulty of Paying Living Expenses: Not hard at all  Food Insecurity: No Food Insecurity   Worried About Charity fundraiser in the Last Year: Never true   [place] in the Last Year: Never true  Transportation Needs: No Transportation Needs   Lack of Transportation (Medical): No   Lack of Transportation (Non-Medical): No  Physical Activity: Inactive   Days of Exercise per Week: 0 days   Minutes of Exercise per Session: 0 min  Stress: No Stress Concern Present   Feeling of Stress : Not at all  Social Connections: Moderately Isolated   Frequency of Communication with Friends and Family: More than three times a week   Frequency of Social Gatherings with Friends and  Family: More than three times a week   Attends Religious Services: Never   Marine scientist or Organizations: No   Attends Archivist Meetings: Never   Marital Status: Married     Clinical Intake: How often do you need to have someone help you when you read instructions, pamphlets, or other written materials from your doctor or pharmacy?: 1 - Never  Diabetic? No   Activities of Daily Living In your present state of health, do you have any  difficulty performing the following activities: 09/22/2021 09/18/2021  Hearing? N N  Vision? N Y  Difficulty concentrating or making decisions? N N  Walking or climbing stairs? N Y  Dressing or bathing? N N  Doing errands, shopping? - Scientist, forensic and eating ? N -  Using the Toilet? N -  In the past six months, have you accidently leaked urine? N -  Do you have problems with loss of bowel control? N -  Managing your Medications? N -  Managing your Finances? N -  Housekeeping or managing your Housekeeping? N -  Some recent data might be hidden    Patient Care Team: [name], MD as PCP - General (Family Medicine) Comer, Okey Regal, MD as Consulting Physician (Infectious Diseases) [facility], [facility], RN as Oncology Nurse Navigator  Indicate any recent Medical Services you may have received from other than [facility] providers in the past year (date may be approximate).     Assessment:   This is a routine wellness examination for Nour. Virtual Visit via Telephone Note  I connected with  Tommie Raymond on 09/22/21 at  1:00 PM EST by telephone and verified that I am speaking with the correct person using two identifiers.  Location: Patient: Home Provider: Office Persons participating in the virtual visit: patient/Nurse Health Advisor   I discussed the limitations, risks, security and privacy concerns of performing an evaluation and management service by telephone and the  availability of in person appointments. The patient expressed understanding and agreed to proceed.  Interactive audio and video telecommunications were attempted between this nurse and patient, however failed, due to patient having technical difficulties OR patient did not have access to video capability.  We continued and completed visit with audio only.  Some vital signs may be absent or patient reported.   Criselda Peaches, LPN    Hearing/Vision screen Hearing Screening - Comments:: No difficulty hearing Vision Screening - Comments:: Wears glasses. Followed by Dr [place]  Dietary issues and exercise activities discussed: Current Exercise Habits: The patient does not participate in regular exercise at present   Goals Addressed   None    Depression Screen PHQ 2/9 Scores 09/22/2021 11/13/2017 10/10/2017  PHQ - 2 Score 0 0 0    Fall Risk Fall Risk  09/22/2021 11/13/2017 10/10/2017  Falls in the past year? 0 No No  Number falls in past yr: 0 - -  Injury with Fall? 0 - -    FALL RISK PREVENTION PERTAINING TO THE HOME:  Any stairs in or around the home? Yes  If so, are there any without handrails? No  Home free of loose throw rugs in walkways, pet beds, electrical cords, etc? Yes  Adequate lighting in your home to reduce risk of falls? Yes   ASSISTIVE DEVICES UTILIZED TO PREVENT FALLS:  Life alert? No  Use of a cane, walker or w/c? No Grab bars in the bathroom? Yes  Shower chair or bench in shower? Yes  Elevated toilet seat or a handicapped toilet? Yes   TIMED UP AND GO:  Was the test performed? No . Audio Visit  Cognitive Function:     Immunizations Immunization History  Administered Date(s) Administered   Fluad Quad(high Dose 65+) 06/29/2021   Influenza Split 07/29/2012   Influenza,inj,Quad PF,6+ Mos 06/27/2019, 07/14/2020   Influenza-Unspecified 07/01/2017, 07/09/2017, 07/09/2018   Moderna Sars-Covid-2 Vaccination 01/18/2020, 02/17/2020   Pneumococcal Conjugate-13  09/04/2019   Pneumococcal Polysaccharide-23 11/13/2017  Tdap 01/21/2014    Covid-19 vaccine status: Information provided on how to obtain vaccines.   Qualifies for Shingles Vaccine? Yes   Zostavax completed No   Shingrix Completed?: No.    Education has been provided regarding the importance of this vaccine. Patient has been advised to call insurance company to determine out of pocket expense if they have not yet received this vaccine. Advised may also receive vaccine at local pharmacy or Health Dept. Verbalized acceptance and understanding.  Screening Tests Health Maintenance  Topic Date Due   COVID-19 Vaccine (3 - Moderna risk series) 10/08/2021 (Originally 03/16/2020)   Zoster Vaccines- Shingrix (1 of 2) 12/21/2021 (Originally [birth year]/1974)   MAMMOGRAM  09/22/2022 (Originally 08/30/2005)   DEXA SCAN  09/22/2022 (Originally 08/30/2020)   COLONOSCOPY (Pts 45-9yrs Insurance coverage will need to be confirmed)  09/22/2022 (Originally 12/29/2017)   Pneumonia Vaccine 57+ Years old (3 - PPSV23 if available, else PCV20) 11/13/2022   TETANUS/TDAP  01/22/2024   INFLUENZA VACCINE  Completed   Hepatitis C Screening  Completed   HPV VACCINES  Aged Out    Health Maintenance  There are no preventive care reminders to display for this patient.  Mammogram: Patient Deferred   Dexa Scan: Patient Deferred   Additional Screening:   Vision Screening: Recommended annual ophthalmology exams for early detection of glaucoma and other disorders of the eye. Is the patient up to date with their annual eye exam?  Yes  Who is the provider or what is the name of the office in which the patient attends annual eye exams? Followed by Dr [place]    Dental Screening: Recommended annual dental exams for proper oral hygiene  Community Resource Referral / Chronic Care Management:  CRR required this visit?  No   CCM required this visit?  No      Plan:     I have personally reviewed and noted the  following in the patients chart:   Medical and social history Use of alcohol, tobacco or illicit drugs  Current medications and supplements including opioid prescriptions. Patient currently not taking opioids Functional ability and status Nutritional status Physical activity Advanced directives List of other physicians Hospitalizations, surgeries, and ER visits in previous 12 months Vitals Screenings to include cognitive, depression, and falls Referrals and appointments  In addition, I have reviewed and discussed with patient certain preventive protocols, quality metrics, and best practice recommendations. A written personalized care plan for preventive services as well as general preventive health recommendations were provided to patient.     Criselda Peaches, LPN   03/24/7627

## 2021-09-22 NOTE — Patient Instructions (Addendum)
[name] , Thank you for taking time to come for your Medicare Wellness Visit. I appreciate your ongoing commitment to your health goals. Please review the following plan we discussed and let me know if I can assist you in the future.   These are the goals we discussed:  Goals   None     This is a list of the screening recommended for you and due dates:  Health Maintenance  Topic Date Due   COVID-19 Vaccine (3 - Moderna risk series) 10/08/2021*   Zoster (Shingles) Vaccine (1 of 2) 12/21/2021*   Mammogram  09/22/2022*   DEXA scan (bone density measurement)  09/22/2022*   Colon Cancer Screening  09/22/2022*   Pneumonia Vaccine (3 - PPSV23 if available, else PCV20) 11/13/2022   Tetanus Vaccine  01/22/2024   Flu Shot  Completed   Hepatitis C Screening: USPSTF Recommendation to screen - Ages 18-79 yo.  Completed   HPV Vaccine  Aged Out  *Topic was postponed. The date shown is not the original due date.   Advanced directives: No  Conditions/risks identified: None  Next appointment: Follow up in one year for your annual wellness visit   Preventive Care 65 Years and Older, Female Preventive care refers to lifestyle choices and visits with your health care provider that can promote health and wellness. What does preventive care include? A yearly physical exam. This is also called an annual well check. Dental exams once or twice a year. Routine eye exams. Ask your health care provider how often you should have your eyes checked. Personal lifestyle choices, including: Daily care of your teeth and gums. Regular physical activity. Eating a healthy diet. Avoiding tobacco and drug use. Limiting alcohol use. Practicing safe sex. Taking low-dose aspirin every day. Taking vitamin and mineral supplements as recommended by your health care provider. What happens during an annual well check? The services and screenings done by your health care provider during your annual well check will  depend on your age, overall health, lifestyle risk factors, and family history of disease. Counseling  Your health care provider may ask you questions about your: Alcohol use. Tobacco use. Drug use. Emotional well-being. Home and relationship well-being. Sexual activity. Eating habits. History of falls. Memory and ability to understand (cognition). Work and work Statistician. Reproductive health. Screening  You may have the following tests or measurements: Height, weight, and BMI. Blood pressure. Lipid and cholesterol levels. These may be checked every 5 years, or more frequently if you are over 43 years old. Skin check. Lung cancer screening. You may have this screening every year starting at age 64 if you have a 30-pack-year history of smoking and currently smoke or have quit within the past 15 years. Fecal occult blood test (FOBT) of the stool. You may have this test every year starting at age 61. Flexible sigmoidoscopy or colonoscopy. You may have a sigmoidoscopy every 5 years or a colonoscopy every 10 years starting at age 65. Hepatitis C blood test. Hepatitis B blood test. Sexually transmitted disease (STD) testing. Diabetes screening. This is done by checking your blood sugar (glucose) after you have not eaten for a while (fasting). You may have this done every 1-3 years. Bone density scan. This is done to screen for osteoporosis. You may have this done starting at age 110. Mammogram. This may be done every 1-2 years. Talk to your health care provider about how often you should have regular mammograms. Talk with your health care provider about your test  results, treatment options, and if necessary, the need for more tests. Vaccines  Your health care provider may recommend certain vaccines, such as: Influenza vaccine. This is recommended every year. Tetanus, diphtheria, and acellular pertussis (Tdap, Td) vaccine. You may need a Td booster every 10 years. Zoster vaccine. You may  need this after age 25. Pneumococcal 13-valent conjugate (PCV13) vaccine. One dose is recommended after age 5. Pneumococcal polysaccharide (PPSV23) vaccine. One dose is recommended after age 1. Talk to your health care provider about which screenings and vaccines you need and how often you need them. This information is not intended to replace advice given to you by your health care provider. Make sure you discuss any questions you have with your health care provider. Document Released: 10/14/2015 Document Revised: 06/06/2016 Document Reviewed: 07/19/2015 Elsevier Interactive Patient Education  2017 [place] Prevention in the Home Falls can cause injuries. They can happen to people of all ages. There are many things you can do to make your home safe and to help prevent falls. What can I do on the outside of my home? Regularly fix the edges of walkways and driveways and fix any cracks. Remove anything that might make you trip as you walk through a door, such as a raised step or threshold. Trim any bushes or trees on the path to your home. Use bright outdoor lighting. Clear any walking paths of anything that might make someone trip, such as rocks or tools. Regularly check to see if handrails are loose or broken. Make sure that both sides of any steps have handrails. Any raised decks and porches should have guardrails on the edges. Have any leaves, snow, or ice cleared regularly. Use sand or salt on walking paths during winter. Clean up any spills in your garage right away. This includes oil or grease spills. What can I do in the bathroom? Use night lights. Install grab bars by the toilet and in the tub and shower. Do not use towel bars as grab bars. Use non-skid mats or decals in the tub or shower. If you need to sit down in the shower, use a plastic, non-slip stool. Keep the floor dry. Clean up any water that spills on the floor as soon as it happens. Remove soap buildup in  the tub or shower regularly. Attach bath mats securely with double-sided non-slip rug tape. Do not have throw rugs and other things on the floor that can make you trip. What can I do in the bedroom? Use night lights. Make sure that you have a light by your bed that is easy to reach. Do not use any sheets or blankets that are too big for your bed. They should not hang down onto the floor. Have a firm chair that has side arms. You can use this for support while you get dressed. Do not have throw rugs and other things on the floor that can make you trip. What can I do in the kitchen? Clean up any spills right away. Avoid walking on wet floors. Keep items that you use a lot in easy-to-reach places. If you need to reach something above you, use a strong step stool that has a grab bar. Keep electrical cords out of the way. Do not use floor polish or wax that makes floors slippery. If you must use wax, use non-skid floor wax. Do not have throw rugs and other things on the floor that can make you trip. What can I do with my stairs?  Do not leave any items on the stairs. Make sure that there are handrails on both sides of the stairs and use them. Fix handrails that are broken or loose. Make sure that handrails are as long as the stairways. Check any carpeting to make sure that it is firmly attached to the stairs. Fix any carpet that is loose or worn. Avoid having throw rugs at the top or bottom of the stairs. If you do have throw rugs, attach them to the floor with carpet tape. Make sure that you have a light switch at the top of the stairs and the bottom of the stairs. If you do not have them, ask someone to add them for you. What else can I do to help prevent falls? Wear shoes that: Do not have high heels. Have rubber bottoms. Are comfortable and fit you well. Are closed at the toe. Do not wear sandals. If you use a stepladder: Make sure that it is fully opened. Do not climb a closed  stepladder. Make sure that both sides of the stepladder are locked into place. Ask someone to hold it for you, if possible. Clearly mark and make sure that you can see: Any grab bars or handrails. First and last steps. Where the edge of each step is. Use tools that help you move around (mobility aids) if they are needed. These include: Canes. Walkers. Scooters. Crutches. Turn on the lights when you go into a dark area. Replace any light bulbs as soon as they burn out. Set up your furniture so you have a clear path. Avoid moving your furniture around. If any of your floors are uneven, fix them. If there are any pets around you, be aware of where they are. Review your medicines with your doctor. Some medicines can make you feel dizzy. This can increase your chance of falling. Ask your doctor what other things that you can do to help prevent falls. This information is not intended to replace advice given to you by your health care provider. Make sure you discuss any questions you have with your health care provider. Document Released: 07/14/2009 Document Revised: 02/23/2016 Document Reviewed: 10/22/2014 Elsevier Interactive Patient Education  2017 [employer].

## 2021-09-23 ENCOUNTER — Emergency Department (HOSPITAL_COMMUNITY): Payer: Medicare Other

## 2021-09-23 ENCOUNTER — Encounter: Payer: Self-pay | Admitting: Family Medicine

## 2021-09-23 ENCOUNTER — Emergency Department (HOSPITAL_COMMUNITY)
Admission: EM | Admit: 2021-09-23 | Discharge: 2021-09-24 | Disposition: A | Discharge status: Home / Self Care | Payer: Medicare Other | Attending: Emergency Medicine | Admitting: Emergency Medicine

## 2021-09-23 ENCOUNTER — Encounter (HOSPITAL_COMMUNITY): Payer: Self-pay

## 2021-09-23 ENCOUNTER — Other Ambulatory Visit: Payer: Self-pay

## 2021-09-23 DIAGNOSIS — U071 COVID-19: Secondary | ICD-10-CM | POA: Insufficient documentation

## 2021-09-23 DIAGNOSIS — J449 Chronic obstructive pulmonary disease, unspecified: Secondary | ICD-10-CM | POA: Diagnosis not present

## 2021-09-23 DIAGNOSIS — R0789 Other chest pain: Secondary | ICD-10-CM

## 2021-09-23 DIAGNOSIS — R0602 Shortness of breath: Secondary | ICD-10-CM

## 2021-09-23 DIAGNOSIS — Z7951 Long term (current) use of inhaled steroids: Secondary | ICD-10-CM | POA: Insufficient documentation

## 2021-09-23 DIAGNOSIS — Z87891 Personal history of nicotine dependence: Secondary | ICD-10-CM | POA: Diagnosis not present

## 2021-09-23 LAB — CBC WITH DIFFERENTIAL/PLATELET
Abs Immature Granulocytes: 0.24 10*3/uL — ABNORMAL HIGH (ref 0.00–0.07)
Basophils Absolute: 0 10*3/uL (ref 0.0–0.1)
Basophils Relative: 0 %
Eosinophils Absolute: 0.3 10*3/uL (ref 0.0–0.5)
Eosinophils Relative: 2 %
HCT: 46.7 % — ABNORMAL HIGH (ref 36.0–46.0)
Hemoglobin: 14.9 g/dL (ref 12.0–15.0)
Immature Granulocytes: 2 %
Lymphocytes Relative: 9 %
Lymphs Abs: 1 10*3/uL (ref 0.7–4.0)
MCH: 28.3 pg (ref 26.0–34.0)
MCHC: 31.9 g/dL (ref 30.0–36.0)
MCV: 88.8 fL (ref 80.0–100.0)
Monocytes Absolute: 0.8 10*3/uL (ref 0.1–1.0)
Monocytes Relative: 7 %
Neutro Abs: 8.4 10*3/uL — ABNORMAL HIGH (ref 1.7–7.7)
Neutrophils Relative %: 80 %
Platelets: 326 10*3/uL (ref 150–400)
RBC: 5.26 MIL/uL — ABNORMAL HIGH (ref 3.87–5.11)
RDW: 12.4 % (ref 11.5–15.5)
WBC: 10.6 10*3/uL — ABNORMAL HIGH (ref 4.0–10.5)
nRBC: 0 % (ref 0.0–0.2)

## 2021-09-23 LAB — HEPATIC FUNCTION PANEL
ALT: 17 U/L (ref 0–44)
AST: 16 U/L (ref 15–41)
Albumin: 3.9 g/dL (ref 3.5–5.0)
Alkaline Phosphatase: 87 U/L (ref 38–126)
Bilirubin, Direct: 0.1 mg/dL (ref 0.0–0.2)
Indirect Bilirubin: 0.6 mg/dL (ref 0.3–0.9)
Total Bilirubin: 0.7 mg/dL (ref 0.3–1.2)
Total Protein: 7.8 g/dL (ref 6.5–8.1)

## 2021-09-23 LAB — LIPASE, BLOOD: Lipase: 27 U/L (ref 11–51)

## 2021-09-23 LAB — RESP PANEL BY RT-PCR (FLU A&B, COVID) ARPGX2
Influenza A by PCR: NEGATIVE
Influenza B by PCR: NEGATIVE
SARS Coronavirus 2 by RT PCR: POSITIVE — AB

## 2021-09-23 LAB — BASIC METABOLIC PANEL
Anion gap: 7 (ref 5–15)
BUN: 19 mg/dL (ref 8–23)
CO2: 28 mmol/L (ref 22–32)
Calcium: 9.3 mg/dL (ref 8.9–10.3)
Chloride: 100 mmol/L (ref 98–111)
Creatinine, Ser: 0.74 mg/dL (ref 0.44–1.00)
GFR, Estimated: >60 mL/min (ref >60)
Glucose, Bld: 102 mg/dL — ABNORMAL HIGH (ref 70–99)
Potassium: 3.8 mmol/L (ref 3.5–5.1)
Sodium: 135 mmol/L (ref 135–145)

## 2021-09-23 LAB — TROPONIN I (HIGH SENSITIVITY): Troponin I (High Sensitivity): <2 ng/L (ref <18)

## 2021-09-23 LAB — D-DIMER, QUANTITATIVE: D-Dimer, Quant: 0.51 ug/mL-FEU — ABNORMAL HIGH (ref 0.00–0.50)

## 2021-09-23 MED ORDER — MOLNUPIRAVIR EUA 200MG CAPSULE: 4.0000 | ORAL_CAPSULE | Freq: Two times a day (BID) | ORAL | 0 refills | Status: DC

## 2021-09-23 MED ORDER — IPRATROPIUM-ALBUTEROL 0.5-2.5 (3) MG/3ML IN SOLN
3.0000 mL | Freq: Once | RESPIRATORY_TRACT | Status: AC
  Administered 2021-09-23: 3 mL via RESPIRATORY_TRACT
  Filled 2021-09-23: qty 3

## 2021-09-23 MED ORDER — PREDNISONE 20 MG PO TABS: 40.0000 mg | ORAL_TABLET | Freq: Every day | ORAL | 0 refills | Status: AC

## 2021-09-23 NOTE — ED Notes (Signed)
[facility] called, transportation arranged.

## 2021-09-23 NOTE — ED Provider Notes (Signed)
[facility] DEPT Provider Note   CSN: [id number] Arrival date & time: 09/23/21  1411     History Chief Complaint  Patient presents with   Covid Positive    [name] is a 65 y.o. female.  The history is provided by the patient and medical records. No language interpreter was used.  Shortness of Breath Severity:  Moderate Onset quality:  Gradual Duration:  2 days Timing:  Constant Progression:  Worsening Chronicity:  Recurrent Context: URI   Relieved by:  Nothing Worsened by:  Deep breathing and exertion Ineffective treatments:  None tried Associated symptoms: chest pain, cough, sputum production and wheezing (improved after duoneb at home)   Associated symptoms: no abdominal pain, no diaphoresis, no fever, no headaches, no neck pain, no rash and no vomiting   Risk factors: hx of cancer   Risk factors: no hx of PE/DVT       Past Medical History:  Diagnosis Date   Anxiety    Arthritis of hand 04/17/2017   Bronchitis    Depression    Dyspnea    Emphysema, unspecified ([place])    per patient mild case   Family history of adverse reaction to anesthesia    patient states sister has a hard time waking up from anesthesia.    Hepatitis C    lung ca dx'd 03/2018   Tremor, unspecified    non-specific; takes inderal.     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   Adenocarcinoma of left lung, stage 1 ([place]) 08/25/2019   Lung mass 03/13/2018   Chronic bronchitis ([place]) 02/04/2018   Left upper lobe pulmonary nodule 02/04/2018   Vaccine counseling 11/13/2017   Cirrhosis ([place]) 11/13/2017   Arthritis of hand 04/17/2017   Hepatitis C, chronic ([place]) 03/14/2015   Tobacco use  disorder 01/21/2014   Tremor 10/14/2012   Migraine headache 08/01/2010   Depression, major, single episode, moderate ([place]) 03/19/2008   GERD 03/19/2008   NEPHROLITHIASIS, HX OF 03/19/2008    Past Surgical History:  Procedure Laterality Date   ABDOMINAL HYSTERECTOMY  1986   menorrhagia; hx of cryo for abnormal paps   APPENDECTOMY     removed in 1986 per pt   INTERCOSTAL NERVE BLOCK Right 12/18/2019   Procedure: Intercostal Nerve Block;  Surgeon: Melrose Nakayama, MD;  Location: [facility];  Service: Thoracic;  Laterality: Right;   LOBECTOMY Left 03/13/2018   Procedure: Lingula section of left upper lobe lobectomy;  Surgeon: Melrose Nakayama, MD;  Location: [facility];  Service: Thoracic;  Laterality: Left;   NODE DISSECTION  12/18/2019   Procedure: Node Dissection;  Surgeon: Melrose Nakayama, MD;  Location: [facility];  Service: Thoracic;;   VIDEO ASSISTED THORACOSCOPY (VATS)/WEDGE RESECTION Left 03/13/2018   Procedure: VIDEO ASSISTED THORACOSCOPY (VATS)/WEDGE RESECTION;  Surgeon: Melrose Nakayama, MD;  Location: [facility];  Service: Thoracic;  Laterality: Left;     OB History   No obstetric history on file.     Family History  Problem Relation Age of Onset   Asthma Mother    Alzheimer's disease Mother 71   Hypertension Mother    Other Father        shot  Neuropathy Sister        face   Cancer Maternal Aunt        breast   Cancer Paternal Aunt    Cancer Paternal Grandmother        abdominal   Lung cancer Paternal Uncle     Social History   Tobacco Use   Smoking status: Former    Packs/day: 1.00    Years: 40.00    Pack years: 40.00    Types: Cigarettes    Quit date: 12/14/2017    Years since quitting: 3.7   Smokeless tobacco: Never  Vaping Use   Vaping Use: Never used  Substance Use Topics   Alcohol use: Yes    Alcohol/week: 7.0 standard drinks    Types: 7 Shots of liquor per week    Comment: occasional bourbon   Drug use: Not Currently    Home  Medications Prior to Admission medications   Medication Sig Start Date End Date Taking? Authorizing Provider  acetaminophen (TYLENOL) 500 MG tablet Take 1,000 mg by mouth every 6 (six) hours as needed for mild pain.    [provider]  albuterol (VENTOLIN HFA) 108 (90 Base) MCG/ACT inhaler Inhale 2 puffs into the lungs every 6 (six) hours as needed for wheezing or shortness of breath. 05/03/21   Koberlein, Steele Berg, MD  benzonatate (TESSALON) 100 MG capsule Take 1 capsule (100 mg total) by mouth every 8 (eight) hours. Patient taking differently: Take 100 mg by mouth 3 (three) times daily as needed for cough. 08/18/21   Dorie Rank, MD  budesonide-formoterol [facility]) 160-4.5 MCG/ACT inhaler Take 2 puffs first thing in am and then another 2 puffs about 12 hours later. Patient not taking: Reported on 09/21/2021 09/07/21   Tanda Rockers, MD  guaiFENesin (MUCINEX) 600 MG 12 hr tablet Take 600 mg by mouth 2 (two) times daily as needed for cough or to loosen phlegm.    [provider]  ipratropium (ATROVENT) 0.02 % nebulizer solution Take 2.5 mLs (0.5 mg total) by nebulization 4 (four) times daily. 06/02/21   [name], MD  Melatonin 10 MG TABS Take 40 mg by mouth at bedtime.    [provider]  omeprazole (PRILOSEC) 20 MG capsule Take 20 mg by mouth daily.    [provider]  PARoxetine (PAXIL) 40 MG tablet Take 1 tablet (40 mg total) by mouth daily. 05/03/21   [name], MD  propranolol ER (INDERAL LA) 60 MG 24 hr capsule Take 1 capsule (60 mg total) by mouth daily. 05/31/21   [name], MD  traZODone (DESYREL) 100 MG tablet Take 1 tablet (100 mg total) by mouth at bedtime as needed for sleep. 09/04/21   [name], MD    Allergies    Aspirin, Morphine sulfate, Oxycodone, Glycopyrrolate, Lactose intolerance (gi), Penicillins, and Sulfa antibiotics  Review of Systems   Review of Systems  Constitutional:  Positive for fatigue.  Negative for chills, diaphoresis and fever.  HENT:  Negative for congestion.   Eyes:  Negative for visual disturbance.  Respiratory:  Positive for cough, sputum production, chest tightness, shortness of breath and wheezing (improved after duoneb at home). Negative for stridor.   Cardiovascular:  Positive for chest pain. Negative for palpitations and leg swelling.  Gastrointestinal:  Negative for abdominal pain, constipation, diarrhea, nausea and vomiting.  Genitourinary:  Negative for dysuria.  Musculoskeletal:  Negative for back pain, neck pain and neck stiffness.  Skin:  Negative for  rash and wound.  Neurological:  Negative for headaches.  Psychiatric/Behavioral:  Negative for agitation.   All other systems reviewed and are negative.  Physical Exam Updated Vital Signs BP 140/78 (BP Location: Left Arm)    Pulse 77    Temp 98.2 F (36.8 C) (Oral)    Resp 18    Ht 5\' 5"  (1.651 m)    Wt 56.7 kg    SpO2 98%    BMI 20.80 kg/m   Physical Exam Vitals and nursing note reviewed.  Constitutional:      General: She is not in acute distress.    Appearance: She is well-developed. She is not ill-appearing, toxic-appearing or diaphoretic.  HENT:     Head: Normocephalic and atraumatic.  Eyes:     Conjunctiva/sclera: Conjunctivae normal.     Pupils: Pupils are equal, round, and reactive to light.  Cardiovascular:     Rate and Rhythm: Normal rate and regular rhythm.     Heart sounds: No murmur heard. Pulmonary:     Effort: Pulmonary effort is normal. No respiratory distress.     Breath sounds: Rhonchi present.  Abdominal:     General: Abdomen is flat.     Palpations: Abdomen is soft.     Tenderness: There is no abdominal tenderness. There is no guarding or rebound.  Musculoskeletal:        General: No swelling or tenderness.     Cervical back: Neck supple.     Right lower leg: No edema.     Left lower leg: No edema.  Skin:    General: Skin is warm and dry.     Capillary Refill: Capillary  refill takes less than 2 seconds.     Findings: No erythema.  Neurological:     Mental Status: She is alert. Mental status is at baseline.  Psychiatric:        Mood and Affect: Mood normal.    ED Results / Procedures / Treatments   Labs (all labs ordered are listed, but only abnormal results are displayed) Labs Reviewed  RESP PANEL BY RT-PCR (FLU A&B, COVID) ARPGX2 - Abnormal; Notable for the following components:      Result Value   SARS Coronavirus 2 by RT PCR POSITIVE (*)    All other components within normal limits  CBC WITH DIFFERENTIAL/PLATELET - Abnormal; Notable for the following components:   WBC 10.6 (*)    RBC 5.26 (*)    HCT 46.7 (*)    Neutro Abs 8.4 (*)    Abs Immature Granulocytes 0.24 (*)    All other components within normal limits  BASIC METABOLIC PANEL - Abnormal; Notable for the following components:   Glucose, Bld 102 (*)    All other components within normal limits  D-DIMER, QUANTITATIVE - Abnormal; Notable for the following components:   D-Dimer, Quant 0.51 (*)    All other components within normal limits  HEPATIC FUNCTION PANEL  LIPASE, BLOOD  TROPONIN I (HIGH SENSITIVITY)  TROPONIN I (HIGH SENSITIVITY)    EKG EKG Interpretation  Date/Time:  Saturday September 23 2021 15:41:59 EST Ventricular Rate:  74 PR Interval:  159 QRS Duration: 88 QT Interval:  399 QTC Calculation: 443 R Axis:   13 Text Interpretation: Sinus rhythm Consider right atrial enlargement When compared to prior,  previous t wave inversions in leads 2,3,AVF now upright. NO STEMI Confirmed by Antony Blackbird [phone]) on 09/23/2021 4:23:03 PM  Radiology DG Chest 2 View  Result Date: 09/23/2021 CLINICAL DATA:  shortness of breath EXAM: CHEST - 2 VIEW COMPARISON:  September 18, 2021 FINDINGS: The cardiomediastinal silhouette is unchanged in contour.Suture lines of bilateral lungs. Emphysematous changes with flattening of the diaphragm. No pleural effusion. No pneumothorax. No acute  pleuroparenchymal abnormality. Visualized abdomen is unremarkable. Mild degenerative changes of the thoracic spine. IMPRESSION: No acute cardiopulmonary abnormality. Electronically Signed   By: Valentino Saxon M.D.   On: 09/23/2021 15:15    Procedures Procedures   Medications Ordered in ED Medications - No data to display  ED Course  I have reviewed the triage vital signs and the nursing notes.  Pertinent labs & imaging results that were available during my care of the patient were reviewed by me and considered in my medical decision making (see chart for details).    MDM Rules/Calculators/A&P                           [name] is a 65 y.o. female with a past medical history significant for previous lung cancer status post VATS and lobectomy, migraines, hepatitis C, COPD, and recent discharge from the hospital 3 days ago after COPD exacerbation/pneumonia newly on home oxygen who presents with worsening shortness of breath, cough, chest tightness, and positive COVID test.  According to patient, she was discharged in hospital several days ago where she was admitted for new oxygen requirement with COPD and pneumonia.  She reports use with antibiotics and the steroids and was doing better until yesterday when she started having more productive cough and shortness of breath.  She reports today she was feeling chest tightness which felt different and tested herself for COVID.  She was COVID-positive 3 times that she checked it this morning.  She is still complaining of chest tightness especially with exertion that felt new.  She denies nausea, vomiting, constipation, or diarrhea.  Denies leg pain or leg swelling and denies any history of pulmonary embolism or thromboembolic disease.  She says that she was on 2 L and is doing very well but has now increased it to 3 L on arrival.  She reports no significant fevers or chills at this time.  On exam, lungs are very coarse but I did not appreciate  very much wheezing.  Chest and back nontender.  No murmur.  Good pulses in extremities with no significant leg tenderness or edema.  EKG revealed no stemi.  Clinically I am concerned that patient is having worsened breathing related to the new COVID diagnosis.  With her recent admission, new COVID diagnosis, lung cancer history, and the new chest tightness with shortness of breath and worsening oxygen requirement, I do feel need to rule out development of a PE or worsening pneumonia.  Patient is amenable to doing a D-dimer and getting some screening labs including troponins.  We will get a COVID/flu swab to make sure she does not have flu as well as there have been some cases of concomitant flu/COVID that do worse.  We will ambulate with pulse oximetry to see if her oxygen requirement continues to rise and when all work-up is completed, anticipate a shared decision-making conversation to discuss management.  6:21 PM Patient's work-up began to return.  Initial troponin is less than 2 is undetectable.  Metabolic panel and hepatic function reassuring.  COVID test is positive as expected but there is no concomitant influenza.  Leukocytosis unchanged from prior with no anemia now.  Lipase not elevated.  D-dimer is elevated at 0.51  however with age adjustment it is negative as she is 65 years old.  Had a long shared decision conversation with patient.  We discussed the D-dimer level and using age-adjusted value making normal for her and she agrees.  She recently had a CT PE study was negative several days ago and she agrees to hold on repeat imaging.  Patient also does not want to wait for second troponin given the improvement from prior.  Patient would like to try going home as she is now back to the 2 L she has been on her baseline and is starting to feel better.  Spoke to pharmacy who agrees with a burst of steroids for several days as well as mildly.  We are for her new COVID diagnosis.  Patient will  follow-up with PCP and understood extremities return precautions for any new or worsened symptoms.  Patient would like to go home so we will discharge her.  Patient discharged in good condition and will need transport home due to the oxygen requirement.   Final Clinical Impression(s) / ED Diagnoses Final diagnoses:  COVID-19  Feeling of chest tightness  Shortness of breath    Rx / DC Orders ED Discharge Orders          Ordered    molnupiravir EUA (LAGEVRIO) 200 mg CAPS capsule  2 times daily        09/23/21 1824    predniSONE (DELTASONE) 20 MG tablet  Daily        09/23/21 1824            Clinical Impression: 1. COVID-19   2. Feeling of chest tightness   3. Shortness of breath     Disposition: Discharge  Condition: Good  I have discussed the results, Dx and Tx plan with the pt(& family if present). He/she/they expressed understanding and agree(s) with the plan. Discharge instructions discussed at great length. Strict return precautions discussed and pt &/or family have verbalized understanding of the instructions. No further questions at time of discharge.    New Prescriptions   MOLNUPIRAVIR EUA (LAGEVRIO) 200 MG CAPS CAPSULE    Take 4 capsules (800 mg total) by mouth 2 (two) times daily for 5 days.   PREDNISONE (DELTASONE) 20 MG TABLET    Take 2 tablets (40 mg total) by mouth daily for 5 days.    Follow Up: [name], MD [place] [address] [place] DEPT [address] [id number] mc [place] [phone]       Alliyah Roesler, Gwenyth Allegra, MD 09/23/21 [phone]

## 2021-09-23 NOTE — ED Triage Notes (Addendum)
Per EMS- Patient tested Covid + with a home test today. Patient reports increased SOB with exertion. Patient has a history of lung cancer, COPD, and pneumonia.  Patient states she normally wears O2 2L/min via [place] at home.

## 2021-09-23 NOTE — ED Provider Notes (Signed)
Emergency Medicine Provider Triage Evaluation Note  [name] , a 65 y.o. female  was evaluated in triage.  Pt complains of worsening cough, shortness of breath. Recently discharged with PNA, completed abx, steroids several days ago. Hx of lung cancer with excision of lower lobes bilaterally per patient, as well as COPD. On 2L [place] chronically. Positive home covid test x 3 today. Endorses chest tightness without pain. Denies NVD. Denies sore throat. Occasional transient frontal headache. No vision changes. Not currently getting radiation / chemo.  Review of Systems  Positive: Shortness of breath, chest tightness Negative: As above  Physical Exam  BP 140/78 (BP Location: Left Arm)    Pulse 77    Temp 98.2 F (36.8 C) (Oral)    Resp 18    Ht 5\' 5"  (1.651 m)    Wt 56.7 kg    SpO2 98%    BMI 20.80 kg/m  Gen:   Awake, no distress   Resp:  Normal effort , no distress on 3L, no accessory breath sounds noted, poor excursion at bases MSK:   Moves extremities without difficulty  Other:  No ttp abdomen  Medical Decision Making  Medically screening exam initiated at 2:42 PM.  Appropriate orders placed.  [name] was informed that the remainder of the evaluation will be completed by another provider, this initial triage assessment does not replace that evaluation, and the importance of remaining in the ED until their evaluation is complete.  Covid +, hx PNA, COPD, lung cancer   Anselmo Pickler, PA-C 09/23/21 1445    Godfrey Pick, MD 09/23/21 [phone]

## 2021-09-23 NOTE — Discharge Instructions (Signed)
Your history, exam and work-up they are consistent with COVID-19 infection causing worsening breathing symptoms on top of your recently injured lungs from the COPD and pneumonia.  The x-ray did not show worsened pneumonia and your labs are overall reassuring as we discussed.  Your troponin was negative and we discussed the age-adjusted negative D-dimer.  Based on your reassuring vital signs and work-up thus far, we agree it is reasonable to let you go home with both a burst of steroids for the wheezing and the COVID medication.  Please follow-up with your primary doctor and if you develop any new or worsened symptoms, please return to the emergency department.

## 2021-09-23 NOTE — ED Notes (Signed)
Patient ambulated in the room on 2L O2 (baseline). O2 sats remained in the upper 90's. Her respiratory rate did jump into the mid 30's at times. Patient complained of feeling short of breath.

## 2021-09-24 DIAGNOSIS — U071 COVID-19: Secondary | ICD-10-CM | POA: Diagnosis not present

## 2021-09-24 MED ORDER — ACETAMINOPHEN 325 MG PO TABS
650.0000 mg | ORAL_TABLET | Freq: Once | ORAL | Status: AC
  Administered 2021-09-24: 03:00:00 650 mg via ORAL
  Filled 2021-09-24: qty 2

## 2021-09-24 MED ORDER — IPRATROPIUM-ALBUTEROL 0.5-2.5 (3) MG/3ML IN SOLN
3.0000 mL | Freq: Once | RESPIRATORY_TRACT | Status: AC
  Administered 2021-09-24: 06:00:00 3 mL via RESPIRATORY_TRACT
  Filled 2021-09-24: qty 3

## 2021-09-24 NOTE — ED Notes (Signed)
Pt provided with breakfast tray. Pt has removed O2 and sat at 96%. Pt intends to go home.

## 2021-09-24 NOTE — ED Notes (Addendum)
PTAR update. Pt is 7 on list.

## 2021-09-27 ENCOUNTER — Encounter: Payer: Self-pay | Admitting: Family Medicine

## 2021-09-27 ENCOUNTER — Telehealth (INDEPENDENT_AMBULATORY_CARE_PROVIDER_SITE_OTHER): Payer: Medicare Other | Admitting: Family Medicine

## 2021-09-27 ENCOUNTER — Other Ambulatory Visit: Payer: Self-pay

## 2021-09-27 DIAGNOSIS — R0902 Hypoxemia: Secondary | ICD-10-CM | POA: Diagnosis not present

## 2021-09-27 DIAGNOSIS — J441 Chronic obstructive pulmonary disease with (acute) exacerbation: Secondary | ICD-10-CM

## 2021-09-27 DIAGNOSIS — U071 COVID-19: Secondary | ICD-10-CM

## 2021-09-27 DIAGNOSIS — D72829 Elevated white blood cell count, unspecified: Secondary | ICD-10-CM | POA: Diagnosis not present

## 2021-09-27 MED ORDER — BUDESONIDE-FORMOTEROL FUMARATE 160-4.5 MCG/ACT IN AERO: INHALATION_SPRAY | RESPIRATORY_TRACT | 12 refills | Status: DC

## 2021-09-27 MED ORDER — PREDNISONE 20 MG PO TABS: ORAL_TABLET | ORAL | 0 refills | Status: DC

## 2021-09-27 NOTE — Progress Notes (Signed)
Virtual Visit via Telephone Note  I connected with [name] on 09/27/21 at  2:00 PM EST by telephone and verified that I am speaking with the correct person using two identifiers.   I discussed the limitations, risks, security and privacy concerns of performing an evaluation and management service by telephone and the availability of in person appointments. I also discussed with the patient that there may be a patient responsible charge related to this service. The patient expressed understanding and agreed to proceed.  Location patient: home Location provider:  [facility]  [place], [place] [address]  Participants present for the call: patient, provider Patient did not have a visit in the prior 7 days to address this/these issue(s).  This is a 65 y.o. female who presents with Chief Complaint  Patient presents with   Hospitalization Follow-up   Covid Positive    Patient states the home and PCR tests were positive on December 24th    History of present illness: This visit was scheduled for hospital follow-up from admission 09/17/2021 and discharged 09/20/2021.  She presented in acute hypoxic respiratory failure requiring supplemental oxygen and admitted for a COPD exacerbation.  She required DuoNeb treatments as well as IV Solu-Medrol and completed a course of antibiotics.  It was suggested that she have a repeat BMP and CBC in 1 week's time.  She was discharged from that hospitalization on 2 additional days of oral prednisone to complete a 5-day treatment.  Unfortunately, she was back in the emergency room on 12/24 after testing positive for COVID at home.  Due to some shortness of breath with exertion, they completed troponin, D-dimer testing.  This was negative.  She was requiring 2 L of oxygen at baseline.  She was sent home with steroid burst and molnupiravir.  Is now coughing up some green mucous. Feels a little better overall. On 2L which keeps her  right around 98% if she takes the oxygen off; she will drop without oxygen and will drop to 88%. Feels like she just needs to get past this hump. She didn't get the anti-viral medication. She didn't have anyone that could go get it for her. She is taking the prednisone.   Using nebulizer every 4-6 hours. Has follow up with pulm on 1/6. Using both albuterol and atroven in neb. Does seem to help her. Esp better first thing in the morning.   Can sleep flat now without sitting up.   Finished up the breztri inhaler; not using any other inhalers right now. Symbicort seemed to be the best. [facility] was best cost.   Taking mucinex just one time a day; worried about it keeping her up at night.    Observations/Objective: Patient sounds cheerful and well on the phone. I do not appreciate any SOB, but she is using oxygen, and she is frequently having coughing spells. Speech and thought processing are grossly intact. Patient reported vitals: O2 sat 98% on 2LNC. Afebrile.  Assessment and Plan: 1. COVID She feels she is turning the corner. Does not feel she needs anti-viral at this point. Close monitoring for any worsening of symptoms. Continue with current nebulized treatments. She feels that symbicort was the best inhaler for her.  Unfortunately, this was very expensive for her.  She also was unable to make a trip out to the pharmacy and does not want to expose anyone else to infection, so I have done some investigation to see if she can get medications delivered  to her.  She was able to get Symbicort through [facility] for $[facility] at [facility], which was the best price she could find. [facility] and they will try to work with her to help with cost of inhaler as well as being able to deliver medication. She has required longer prednisone taper with her prior resp illness, so I am going to send a continuing taper for her. I also gave her the # for [place] and me to call and see if she could get symbicort for  free/lower cost.   2. COPD exacerbation ([place]) See above. - budesonide-formoterol (SYMBICORT) 160-4.5 MCG/ACT inhaler; Take 2 puffs first thing in am and then another 2 puffs about 12 hours later.  Dispense: 1 each; Refill: 12  3. Hypoxia Continue to monitor at home. - Comprehensive metabolic panel; Future  4. Leukocytosis, unspecified type - CBC with Differential/Platelet; Future   Follow Up Instructions:  prn  I did not refer this patient for an OV in the next 24 hours for this/these issue(s).  I discussed the assessment and treatment plan with the patient. The patient was provided an opportunity to ask questions and all were answered. The patient agreed with the plan and demonstrated an understanding of the instructions.   The patient was advised to call back or seek an in-person evaluation if the symptoms worsen or if the condition fails to improve as anticipated.  I provided 30 minutes of non-face-to-face time during this encounter.   Micheline Rough, MD

## 2021-09-28 ENCOUNTER — Telehealth: Payer: Self-pay | Admitting: Family Medicine

## 2021-09-28 DIAGNOSIS — J449 Chronic obstructive pulmonary disease, unspecified: Secondary | ICD-10-CM

## 2021-09-28 DIAGNOSIS — J441 Chronic obstructive pulmonary disease with (acute) exacerbation: Secondary | ICD-10-CM

## 2021-09-28 MED ORDER — PREDNISONE 20 MG PO TABS: ORAL_TABLET | ORAL | 0 refills | Status: DC

## 2021-09-28 NOTE — Telephone Encounter (Signed)
Ok to order oxygen through [facility] - typically they will have specific order sheet to complete? Are they able to fax that to [place] to expedite process? I believe that she also was wanting portable oxygen options, so please talk with her to determine what all she needs/wants and make sure she is set with supplies so that she does not run out of oxygen during this change of companies.

## 2021-09-28 NOTE — Telephone Encounter (Signed)
Patient states that she has an in home oxygen but she's not happy with the company that's providing it to her. Patient wants to switch to [facility].  The fax number for [facility] is [phone].  Patient stated that [facility] informed her that they need request in home fill concentration / home fill concentration intake from [facility].  Patient could be contacted at [phone] with any questions.  Please advise.

## 2021-09-29 ENCOUNTER — Encounter: Payer: Self-pay | Admitting: Family Medicine

## 2021-09-29 NOTE — Telephone Encounter (Signed)
DME order entered and a community message was sent to [employer] to see if they can assist the patient since she was using another company.  Patient sent a message with this information via Mychart message also.

## 2021-10-03 ENCOUNTER — Encounter: Payer: Self-pay | Admitting: Family Medicine

## 2021-10-04 ENCOUNTER — Encounter: Payer: Self-pay | Admitting: Family Medicine

## 2021-10-04 ENCOUNTER — Telehealth: Payer: Self-pay | Admitting: Family Medicine

## 2021-10-04 NOTE — Telephone Encounter (Signed)
See prior Mychart message that was sent to the patient.

## 2021-10-04 NOTE — Telephone Encounter (Signed)
Pt call and stated she want Joann to give her a call back about [place] and her oxygen order.

## 2021-10-04 NOTE — Telephone Encounter (Signed)
See Mychart message that was sent to the patient.

## 2021-10-06 ENCOUNTER — Ambulatory Visit (INDEPENDENT_AMBULATORY_CARE_PROVIDER_SITE_OTHER): Payer: Medicare Other | Admitting: Nurse Practitioner

## 2021-10-06 ENCOUNTER — Ambulatory Visit (INDEPENDENT_AMBULATORY_CARE_PROVIDER_SITE_OTHER): Payer: Medicare Other

## 2021-10-06 ENCOUNTER — Other Ambulatory Visit: Payer: Self-pay

## 2021-10-06 ENCOUNTER — Encounter: Payer: Self-pay | Admitting: Nurse Practitioner

## 2021-10-06 VITALS — BP 110/76 | HR 86 | Temp 97.9°F | Ht 64.0 in | Wt 130.8 lb

## 2021-10-06 DIAGNOSIS — C3492 Malignant neoplasm of unspecified part of left bronchus or lung: Secondary | ICD-10-CM

## 2021-10-06 DIAGNOSIS — J90 Pleural effusion, not elsewhere classified: Secondary | ICD-10-CM

## 2021-10-06 DIAGNOSIS — Z09 Encounter for follow-up examination after completed treatment for conditions other than malignant neoplasm: Secondary | ICD-10-CM | POA: Diagnosis not present

## 2021-10-06 DIAGNOSIS — J9611 Chronic respiratory failure with hypoxia: Secondary | ICD-10-CM

## 2021-10-06 DIAGNOSIS — J310 Chronic rhinitis: Secondary | ICD-10-CM

## 2021-10-06 DIAGNOSIS — J449 Chronic obstructive pulmonary disease, unspecified: Secondary | ICD-10-CM | POA: Diagnosis not present

## 2021-10-06 HISTORY — DX: Pleural effusion, not elsewhere classified: J90

## 2021-10-06 HISTORY — DX: Chronic respiratory failure with hypoxia: J96.11

## 2021-10-06 HISTORY — DX: Chronic rhinitis: J31.0

## 2021-10-06 MED ORDER — FLUTICASONE PROPIONATE 50 MCG/ACT NA SUSP: 1.0000 | Freq: Every day | NASAL | 2 refills | Status: DC

## 2021-10-06 MED ORDER — TRELEGY ELLIPTA 100-62.5-25 MCG/ACT IN AEPB: 1.0000 | INHALATION_SPRAY | Freq: Every day | RESPIRATORY_TRACT | 0 refills | Status: DC

## 2021-10-06 MED ORDER — TRELEGY ELLIPTA 100-62.5-25 MCG/ACT IN AEPB: 1.0000 | INHALATION_SPRAY | Freq: Every day | RESPIRATORY_TRACT | 3 refills | Status: DC

## 2021-10-06 NOTE — Assessment & Plan Note (Addendum)
Clinically improved since hospitalization. Given recent exacerbation and oxygen requirement, will step up therapy to Trelegy 100 1 puff daily. Educated on proper use. Continue nebs and rescue inhaler PRN. Finish prednisone taper as previously prescribed. Cough possibly exacerbated by postnasal drip - Flonase daily and saline nasal spray. CXR today did not show any consolidative process; new small right pleural effusion. Will plan for walking oximetry at next visit.  Patient Instructions  -Continue albuterol 2 puffs or atrovent neb 3 mL every 6 hours as needed for shortness of breath or wheezing -Continue mucinex 600 mg Twice daily  -Continue flutter valve therapy  -Complete prednisone taper as previously ordered -Continue supplemental oxygen at 2 lpm for oxygen saturation goal >88-90%. Notify of any increasing requirements.   Stop Wixela and start Trelegy 1 puff daily. Brush tongue and rinse mouth afterwards. -Flonase nasal spray 1 spray each nostril daily for runny nose/nasal congestion -Saline nasal spray each nostril 2-3 times a day.  Chest x ray today. We will notify you of any abnormal results.   Notify if worsening breathlessness, cough, mucus production, fatigue, or wheezing occurs.  Maintain up to date vaccinations, including influenza, COVID, and pneumococcal.  Wash your hands often and avoid sick exposures.  Encouraged masking in crowds.  Avoid triggers, when possible.  Exercise, as tolerated. Notify if worsening symptoms upon exertion occur.    Follow up in one month with Dr. Melvyn Novas or Alanson Aly. If symptoms do not improve or worsen, please contact office for sooner follow up or seek emergency care.

## 2021-10-06 NOTE — Progress Notes (Signed)
@Patient  ID: [name], female    DOB: 1955-11-04, 66 y.o.   MRN: [id number]  Chief Complaint  Patient presents with   Follow-up    Patient says she is doing better after having pneumonia for 4 months     Referring provider: [name], MD  HPI: 66 year old female, former smoker (40 pack years) followed for COPD Gold 3.  She is a patient Dr. Gustavus Bryant and was last seen in office on 09/08/2019.  She is currently followed by Dr. 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 Past medical history significant for migraines, GERD, chronic hepatitis C, cirrhosis, depression, tremor, tobacco abuse.  TEST/EVENTS:  12/14/2019 PFTs: FVC 2.63 (79), FEV1 1.02 (40), ratio 39, TLC 5.78 (111), DLCO corrected 9.24 (44) 09/19/2019 CTA chest: No PE.  Centrilobular and paraseptal emphysematous lung disease.  Mild posterior right upper lobe atelectasis and/or infiltrate. 7 mm nonsolid right upper lobe lung nodule noted on previous exam not well seen.  09/23/2021 CXR 2 view: Emphysematous changes with flattening of diaphragm.  No acute cardiopulmonary disease.  09/07/2021: OV with Dr. Melvyn Novas.  COPD exacerbation.  Treated with prednisone taper and Mucinex.  Maintained on Symbicort and as needed albuterol.  09/17/2021-09/20/2021: Hospitalization for COPD exacerbation and acute hypoxic respiratory failure.  Treated with antibiotics and steroids.  CTA negative for PE.  CT chest right upper lobe airspace opacity concerning for atelectasis versus infiltrate.  COVID and flu negative.  Discharged on home oxygen.  09/24/2019: ED visit for shortness of breath and chest tightness.  COVID-positive.  D dimer negative after age adjustment - shared decision to not repeat CTA. Tx with molnupiravir and prednisone burst. Discharged home.  10/06/2021: Today - hospital follow up Patient presents today for hospital follow up after being treated for COPD exacerbation and acute  respiratory failure followed by COVID. She reports feeling significantly better since being discharged. She continues to experience shortness of breath with exertion but states that it is not bad. She has noticed a huge improvement in her breathing when compared to the past few months. She has had a cough that is productive with yellow sputum production. She denies worsening of her cough when compared to her hospitalization but does feel like she is coughing up a little more phlegm. She is unsure if this is from postnasal drip and her runny nose or not. She denies fevers, chills, orthopnea, PND, wheezing, chest pain, or lower extremity swelling. She continues on [place] Twice daily. She has 3 days left of her prednisone taper. She previously completed her antibiotics without issues. She continues on 2 lpm  supplemental O2. She does occasionally remove it at home and reports that if she goes to walk to the kitchen and back, her O2 will be between 88-90% so she puts it back on. Overall, she feels much better and reports her breathing as stable.   Allergies  Allergen Reactions   Aspirin Shortness Of Breath   Morphine Sulfate Other (See Comments)    RED STREAKS ON ARMS.   Oxycodone Itching and Rash   Glycopyrrolate     UNSPECIFIED REACTION  pt unsure if she is allergic.   Lactose Intolerance (Gi) Nausea And Vomiting    Milk only.Patient states that she can eat cheese.   Penicillins Other (See Comments)     Heartburn, upset stomach only & tolerated AMOXIL after this reaction Has patient had a PCN reaction causing immediate rash, facial/tongue/throat swelling, SOB or lightheadedness with hypotension:  No Has patient had a PCN reaction causing severe rash involving mucus membranes or skin necrosis: No Has patient had a PCN reaction that required hospitalization: No Has patient had a PCN reaction occurring within the last 10 years: #  #  #  YES  #  #  #    Sulfa Antibiotics Tinitus    Immunization  History  Administered Date(s) Administered   Fluad Quad(high Dose 65+) 06/29/2021   Influenza Split 07/29/2012   Influenza,inj,Quad PF,6+ Mos 06/27/2019, 07/14/2020   Influenza-Unspecified 07/01/2017, 07/09/2017, 07/09/2018   Moderna Sars-Covid-2 Vaccination 01/18/2020, 02/17/2020   Pneumococcal Conjugate-13 09/04/2019   Pneumococcal Polysaccharide-23 11/13/2017   Tdap 01/21/2014    Past Medical History:  Diagnosis Date   Anxiety    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    Hepatitis C    lung ca dx'd 03/2018   Tremor, unspecified    non-specific; takes inderal.     Tobacco History: Social History   Tobacco Use  Smoking Status Former   Packs/day: 1.00   Years: 40.00   Pack years: 40.00   Types: Cigarettes   Quit date: 12/14/2017   Years since quitting: 3.8  Smokeless Tobacco Never   Counseling given: Not Answered   Outpatient Medications Prior to Visit  Medication Sig Dispense Refill   acetaminophen (TYLENOL) 500 MG tablet Take 1,000 mg by mouth every 6 (six) hours as needed for mild pain.     albuterol (VENTOLIN HFA) 108 (90 Base) MCG/ACT inhaler Inhale 2 puffs into the lungs every 6 (six) hours as needed for wheezing or shortness of breath. 8 g 0   guaiFENesin (MUCINEX) 600 MG 12 hr tablet Take 600 mg by mouth 2 (two) times daily as needed for cough or to loosen phlegm.     Melatonin 10 MG TABS Take 40 mg by mouth at bedtime.     omeprazole (PRILOSEC) 20 MG capsule Take 20 mg by mouth daily.     PARoxetine (PAXIL) 40 MG tablet Take 1 tablet (40 mg total) by mouth daily. 90 tablet 1   predniSONE (DELTASONE) 20 MG tablet Take 2 tablets (40mg ) daily x 2 days, then 1 tablet (20mg ) daily x 4 days, then 1/2 tablet (10mg ) daily x 2 days 9 tablet 0   propranolol ER  (INDERAL LA) 60 MG 24 hr capsule Take 1 capsule (60 mg total) by mouth daily. 90 capsule 1   traZODone (DESYREL) 100 MG tablet Take 1 tablet (100 mg total) by mouth at bedtime as needed for sleep. 90 tablet 1   budesonide-formoterol (SYMBICORT) 160-4.5 MCG/ACT inhaler Take 2 puffs first thing in am and then another 2 puffs about 12 hours later. 1 each 12   ipratropium (ATROVENT) 0.02 % nebulizer solution Take 2.5 mLs (0.5 mg total) by nebulization 4 (four) times daily. 75 mL 12   No facility-administered medications prior to visit.     Review of Systems:   Constitutional: No weight loss or gain, night sweats, fevers, chills, fatigue, or lassitude. HEENT: No headaches, difficulty swallowing, tooth/dental problems, or sore throat. No sneezing, itching, ear ache. +rhinorrhea, post nasal drip CV:  No chest pain, orthopnea, PND, swelling in lower extremities, anasarca, dizziness, palpitations, syncope Resp: +shortness of breath with exertion (  improved); productive cough. No hemoptysis. No wheezing.  No chest wall deformity GI:  No heartburn, indigestion, abdominal pain, nausea, vomiting, diarrhea, change in bowel habits, loss of appetite, bloody stools.  GU: No dysuria, change in color of urine, urgency or frequency.  No flank pain, no hematuria  Skin: No rash, lesions, ulcerations MSK:  No joint pain or swelling.  No decreased range of motion.  No back pain. Neuro: No dizziness or lightheadedness.  Psych: No depression or anxiety. Mood stable.     Physical Exam:  BP 110/76 (BP Location: Left Arm, Patient Position: Sitting, Cuff Size: Normal)    Pulse 86    Temp 97.9 F (36.6 C) (Oral)    Ht 5\' 4"  (1.626 m)    Wt 130 lb 12.8 oz (59.3 kg)    SpO2 97%    BMI 22.45 kg/m   GEN: Pleasant, interactive, well-nourished; chronically-ill appearing; in no acute distress. HEENT:  Normocephalic and atraumatic. EACs patent bilaterally. TM pearly gray with present light reflex bilaterally. PERRLA. Sclera  white. Nasal turbinates pink, moist and patent bilaterally. Clear rhinorrhea present. Oropharynx erythematous and moist, without exudate or edema. No lesions, ulcerations. NECK:  Supple w/ fair ROM. No JVD present. Normal carotid impulses w/o bruits. Thyroid symmetrical with no goiter or nodules palpated. No lymphadenopathy.   CV: RRR, no m/r/g, no peripheral edema. Pulses intact, +2 bilaterally. No cyanosis, pallor or clubbing. PULMONARY:  Unlabored, regular breathing. Diminished bases bilaterally; clear otherwise A&P w/o wheezes/rales/rhonchi. No accessory muscle use. No dullness to percussion. GI: BS present and normoactive. Soft, non-tender to palpation. No organomegaly or masses detected. No CVA tenderness. MSK: No erythema, warmth or tenderness. Cap refil <2 sec all extrem. No deformities or joint swelling noted.  Neuro: A/Ox3. No focal deficits noted.   Skin: Warm, no lesions or rashe Psych: Normal affect and behavior. Judgement and thought content appropriate.     Lab Results:  CBC    Component Value Date/Time   WBC 10.6 (H) 09/23/2021 1559   RBC 5.26 (H) 09/23/2021 1559   HGB 14.9 09/23/2021 1559   HGB 14.6 04/10/2021 0928   HCT 46.7 (H) 09/23/2021 1559   PLT 326 09/23/2021 1559   PLT 292 04/10/2021 0928   MCV 88.8 09/23/2021 1559   MCH 28.3 09/23/2021 1559   MCHC 31.9 09/23/2021 1559   RDW 12.4 09/23/2021 1559   LYMPHSABS 1.0 09/23/2021 1559   MONOABS 0.8 09/23/2021 1559   EOSABS 0.3 09/23/2021 1559   BASOSABS 0.0 09/23/2021 1559    BMET    Component Value Date/Time   NA 135 09/23/2021 1559   K 3.8 09/23/2021 1559   CL 100 09/23/2021 1559   CO2 28 09/23/2021 1559   GLUCOSE 102 (H) 09/23/2021 1559   BUN 19 09/23/2021 1559   CREATININE 0.74 09/23/2021 1559   CREATININE 0.83 04/10/2021 0928   CREATININE 0.92 07/14/2020 1059   CALCIUM 9.3 09/23/2021 1559   GFRNONAA >60 09/23/2021 1559   GFRNONAA >60 04/10/2021 0928   GFRNONAA 66 07/14/2020 1059   GFRAA 76  07/14/2020 1059    BNP    Component Value Date/Time   BNP 83.9 09/18/2021 0020     Imaging:  DG Chest 2 View  Result Date: 10/06/2021 CLINICAL DATA:  Productive cough. EXAM: CHEST - 2 VIEW COMPARISON:  September 23, 2021. FINDINGS: The heart size and mediastinal contours are within normal limits. Postsurgical change in scarring is seen in left lung. Small right pleural effusion is noted. No pneumothorax is  noted. No acute consolidative process is noted. The visualized skeletal structures are unremarkable. IMPRESSION: Small right pleural effusion. No acute pulmonary abnormality is noted. Electronically Signed   By: Marijo Conception M.D.   On: 10/06/2021 15:08   DG Chest 2 View  Result Date: 09/23/2021 CLINICAL DATA:  shortness of breath EXAM: CHEST - 2 VIEW COMPARISON:  September 18, 2021 FINDINGS: The cardiomediastinal silhouette is unchanged in contour.Suture lines of bilateral lungs. Emphysematous changes with flattening of the diaphragm. No pleural effusion. No pneumothorax. No acute pleuroparenchymal abnormality. Visualized abdomen is unremarkable. Mild degenerative changes of the thoracic spine. IMPRESSION: No acute cardiopulmonary abnormality. Electronically Signed   By: Valentino Saxon M.D.   On: 09/23/2021 15:15   CT Angio Chest Pulmonary Embolism (PE) W or WO Contrast  Result Date: 09/18/2021 CLINICAL DATA:  Shortness of breath and dizziness. EXAM: CT ANGIOGRAPHY CHEST WITH CONTRAST TECHNIQUE: Multidetector CT imaging of the chest was performed using the standard protocol during bolus administration of intravenous contrast. Multiplanar CT image reconstructions and MIPs were obtained to evaluate the vascular anatomy. CONTRAST:  29mL OMNIPAQUE IOHEXOL 350 MG/ML SOLN COMPARISON:  April 10, 2021 FINDINGS: Cardiovascular: Satisfactory opacification of the pulmonary arteries to the segmental level. No evidence of pulmonary embolism. Normal heart size. No pericardial effusion.  Mediastinum/Nodes: No enlarged mediastinal, hilar, or axillary lymph nodes. Thyroid gland, trachea, and esophagus demonstrate no significant findings. Lungs/Pleura: Centrilobular and paraseptal emphysematous lung disease is seen with mild posterior right upper lobe atelectasis and/or infiltrate. Surgical sutures are seen along the posteromedial aspect of the left upper lobe and posteromedial aspect of the right lower lobe. The 7 mm non-solid right upper lobe lung nodule seen on the prior study is not clearly visualized on the current exam. There is no evidence of a pleural effusion or pneumothorax. Upper Abdomen: No acute abnormality. Musculoskeletal: Multilevel degenerative changes seen throughout the thoracic spine. Review of the MIP images confirms the above findings. IMPRESSION: 1. No evidence of pulmonary embolism. 2. Centrilobular and paraseptal emphysematous lung disease with stable bilateral postoperative changes. 3. Mild, posterior right upper lobe atelectasis and/or infiltrate. Emphysema (ICD10-J43.9). Electronically Signed   By: Virgina Norfolk M.D.   On: 09/18/2021 03:18   DG Chest Port 1 View  Result Date: 09/18/2021 CLINICAL DATA:  Shortness of breath and dizziness. EXAM: PORTABLE CHEST 1 VIEW COMPARISON:  September 04, 2021 FINDINGS: There is no evidence of acute infiltrate. Small, stable bilateral pleural effusions are seen. No pneumothorax is identified. The heart size and mediastinal contours are within normal limits. The visualized skeletal structures are unremarkable. IMPRESSION: Small, stable bilateral pleural effusions. Electronically Signed   By: Virgina Norfolk M.D.   On: 09/18/2021 00:24      PFT Results Latest Ref Rng & Units 12/14/2019 03/11/2018  FVC-Pre L 2.01 1.86  FVC-Predicted Pre % 60 55  FVC-Post L 2.63 2.47  FVC-Predicted Post % 79 73  Pre FEV1/FVC % % 42 52  Post FEV1/FCV % % 39 52  FEV1-Pre L 0.85 0.97  FEV1-Predicted Pre % 33 37  FEV1-Post L 1.02 1.28  DLCO  uncorrected ml/min/mmHg 10.06 10.99  DLCO UNC% % 48 42  DLCO corrected ml/min/mmHg 9.24 10.54  DLCO COR %Predicted % 44 41  DLVA Predicted % 55 54  TLC L 5.78 5.37  TLC % Predicted % 111 103  RV % Predicted % 168 144    No results found for: NITRICOXIDE      Assessment & Plan:   COPD GOLD 3  Clinically improved since hospitalization. Given recent exacerbation and oxygen requirement, will step up therapy to Trelegy 100 1 puff daily. Educated on proper use. Continue nebs and rescue inhaler PRN. Finish prednisone taper as previously prescribed. Cough possibly exacerbated by postnasal drip - Flonase daily and saline nasal spray. CXR today did not show any consolidative process; new small right pleural effusion. Will plan for walking oximetry at next visit.  Patient Instructions  -Continue albuterol 2 puffs or atrovent neb 3 mL every 6 hours as needed for shortness of breath or wheezing -Continue mucinex 600 mg Twice daily  -Continue flutter valve therapy  -Complete prednisone taper as previously ordered -Continue supplemental oxygen at 2 lpm for oxygen saturation goal >88-90%. Notify of any increasing requirements.   Stop Wixela and start Trelegy 1 puff daily. Brush tongue and rinse mouth afterwards. -Flonase nasal spray 1 spray each nostril daily for runny nose/nasal congestion -Saline nasal spray each nostril 2-3 times a day.  Chest x ray today. We will notify you of any abnormal results.   Notify if worsening breathlessness, cough, mucus production, fatigue, or wheezing occurs.  Maintain up to date vaccinations, including influenza, COVID, and pneumococcal.  Wash your hands often and avoid sick exposures.  Encouraged masking in crowds.  Avoid triggers, when possible.  Exercise, as tolerated. Notify if worsening symptoms upon exertion occur.    Follow up in one month with Dr. Melvyn Novas or Alanson Aly. If symptoms do not improve or worsen, please contact office for sooner follow up  or seek emergency care.   Chronic respiratory failure with hypoxia (HCC) Continue supplemental oxygen at 2 lpm for SpO2 goal >88-90%. No increasing requirements.  Chronic rhinitis Could be contributing to her cough. Flonase nasal spray 1 spray each nostril daily. Saline nasal spray 2-3 times a day.   Pleural effusion New, small right pleural effusion. Given clinical improvement, will monitor symptoms. Return precautions discussed.   Adenocarcinoma of left lung, stage 1 (HCC) Follow up with Dr. Julien Nordmann as scheduled.    Clayton Bibles, NP 10/06/2021  Pt aware and understands NP's role.

## 2021-10-06 NOTE — Assessment & Plan Note (Signed)
Follow up with Dr. Julien Nordmann as scheduled.

## 2021-10-06 NOTE — Assessment & Plan Note (Signed)
Could be contributing to her cough. Flonase nasal spray 1 spray each nostril daily. Saline nasal spray 2-3 times a day.

## 2021-10-06 NOTE — Assessment & Plan Note (Addendum)
Continue supplemental oxygen at 2 lpm for SpO2 goal >88-90%. No increasing requirements.

## 2021-10-06 NOTE — Assessment & Plan Note (Signed)
New, small right pleural effusion. Given clinical improvement, will monitor symptoms. Return precautions discussed.

## 2021-10-06 NOTE — Patient Instructions (Addendum)
-  Continue albuterol 2 puffs or atrovent neb 3 mL every 6 hours as needed for shortness of breath or wheezing -Continue mucinex 600 mg Twice daily  -Continue flutter valve therapy  -Complete prednisone taper as previously ordered -Continue supplemental oxygen at 2 lpm for oxygen saturation goal >88-90%. Notify of any increasing requirements.   Stop Wixela and start Trelegy 1 puff daily. Brush tongue and rinse mouth afterwards. -Flonase nasal spray 1 spray each nostril daily for runny nose/nasal congestion -Saline nasal spray each nostril 2-3 times a day.  Chest x ray today. We will notify you of any abnormal results.   Notify if worsening breathlessness, cough, mucus production, fatigue, or wheezing occurs.  Maintain up to date vaccinations, including influenza, COVID, and pneumococcal.  Wash your hands often and avoid sick exposures.  Encouraged masking in crowds.  Avoid triggers, when possible.  Exercise, as tolerated. Notify if worsening symptoms upon exertion occur.    Follow up in one month with Dr. Melvyn Novas or Alanson Aly. If symptoms do not improve or worsen, please contact office for sooner follow up or seek emergency care.

## 2021-10-09 ENCOUNTER — Other Ambulatory Visit: Payer: Self-pay | Admitting: Family Medicine

## 2021-10-09 NOTE — Telephone Encounter (Signed)
Received request for gentamicin eye drops. I am ok with single time refill if she feels she has similar eye redness, discharge as previous time this medication was prescribed.  But if she is having significant eye discomfort or any difficulty with vision, she needs an appointment.

## 2021-10-10 NOTE — Telephone Encounter (Signed)
Spoke with the patient and informed her of the message below.  Patient complains of the same symptoms as she had previously with pink eye and denies any vision problems.  Patient is aware a refill was sent to the pharmacy and agreed to call back for an appt if needed.

## 2021-10-12 ENCOUNTER — Telehealth (INDEPENDENT_AMBULATORY_CARE_PROVIDER_SITE_OTHER): Payer: Medicare Other | Admitting: Family Medicine

## 2021-10-12 ENCOUNTER — Encounter: Payer: Self-pay | Admitting: Family Medicine

## 2021-10-12 ENCOUNTER — Other Ambulatory Visit: Payer: Self-pay

## 2021-10-12 VITALS — Temp 98.4°F

## 2021-10-12 DIAGNOSIS — R519 Headache, unspecified: Secondary | ICD-10-CM

## 2021-10-12 DIAGNOSIS — R0981 Nasal congestion: Secondary | ICD-10-CM

## 2021-10-12 MED ORDER — DOXYCYCLINE HYCLATE 100 MG PO TABS: 100.0000 mg | ORAL_TABLET | Freq: Two times a day (BID) | ORAL | 0 refills | Status: DC

## 2021-10-12 NOTE — Patient Instructions (Signed)
-  I sent the medication(s) we discussed to your pharmacy: Meds ordered this encounter  Medications   doxycycline (VIBRA-TABS) 100 MG tablet    Sig: Take 1 tablet (100 mg total) by mouth 2 (two) times daily.    Dispense:  14 tablet    Refill:  0   Nasal saline twice daily.   I hope you are feeling better soon!  Seek in person care promptly if your symptoms worsen, new concerns arise or you are not improving with treatment.  It was nice to meet you today. I help [facility] out with telemedicine visits on Tuesdays and Thursdays and am happy to help if you need a virtual follow up visit on those days. Otherwise, if you have any concerns or questions following this visit please schedule a follow up visit with your Primary Care office or seek care at a local urgent care clinic to avoid delays in care

## 2021-10-12 NOTE — Progress Notes (Signed)
Virtual Visit via Telephone Note  I connected with [name] on 10/12/21 at  1:20 PM EST by telephone and verified that I am speaking with the correct person using two identifiers.   I discussed the limitations of performing an evaluation and management service by telephone and requested permission for a phone visit. The patient expressed understanding and agreed to proceed.  Location patient:  [place] Location provider: work or home office Participants present for the call: patient, provider Patient did not have a visit with me in the prior 7 days to address this/these issue(s).   History of Present Illness:  Acute telemedicine visit for a "sinus infection": -Onset: was recovering for a resp infection/covid infection she had in December, then the last few days sinus issues have worsened and is worried has a sinus infection -still on O2 -Symptoms include: nasal congestion, sinus discomfort/pain, PND - some thick sinus drainage now, cough - but reports this is chronic -she wants to start an abx as is very worried about a bacterial sinusitis -Denies:fevers, increased O2 requirements, worsening cough or SOB, malaise, NVD -Pertinent past medical history: see below -Pertinent medication allergies:  Allergies  Allergen Reactions   Aspirin Shortness Of Breath   Morphine Sulfate Other (See Comments)    RED STREAKS ON ARMS.   Oxycodone Itching and Rash   Glycopyrrolate     UNSPECIFIED REACTION  pt unsure if she is allergic.   Lactose Intolerance (Gi) Nausea And Vomiting    Milk only.Patient states that she can eat cheese.   Penicillins Other (See Comments)     Heartburn, upset stomach only & tolerated AMOXIL after this reaction Has patient had a PCN reaction causing immediate rash, facial/tongue/throat swelling, SOB or lightheadedness with hypotension: No Has patient had a PCN reaction causing severe rash involving mucus membranes or skin necrosis: No Has patient had a PCN reaction that  required hospitalization: No Has patient had a PCN reaction occurring within the last 10 years: #  #  #  YES  #  #  #    Sulfa Antibiotics Tinitus  -COVID-19 vaccine status: Immunization History  Administered Date(s) Administered   Fluad Quad(high Dose 65+) 06/29/2021   Influenza Split 07/29/2012   Influenza,inj,Quad PF,6+ Mos 06/27/2019, 07/14/2020   Influenza-Unspecified 07/01/2017, 07/09/2017, 07/09/2018   Moderna Sars-Covid-2 Vaccination 01/18/2020, 02/17/2020   Pneumococcal Conjugate-13 09/04/2019   Pneumococcal Polysaccharide-23 11/13/2017   Tdap 01/21/2014      Past Medical History:  Diagnosis Date   Anxiety    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    Hepatitis C    lung ca dx'd 03/2018   Tremor, unspecified    non-specific; takes inderal.     Current Outpatient Medications on File Prior to Visit  Medication Sig Dispense Refill   acetaminophen (TYLENOL) 500 MG tablet Take 1,000 mg by mouth every 6 (six) hours as needed for mild pain.     albuterol (VENTOLIN HFA) 108 (90 Base) MCG/ACT inhaler Inhale 2 puffs into the lungs every 6 (six) hours as needed for wheezing or shortness of breath. 8 g 0   fluticasone (FLONASE) 50 MCG/ACT nasal spray Place 1 spray into both nostrils daily. 18.2 mL 2   Fluticasone-Umeclidin-Vilant (TRELEGY ELLIPTA) 100-62.5-25 MCG/ACT AEPB  Inhale 1 puff into the lungs daily. 1 each 3   Fluticasone-Umeclidin-Vilant (TRELEGY ELLIPTA) 100-62.5-25 MCG/ACT AEPB Inhale 1 puff into the lungs daily. 60 each 0   gentamicin (GARAMYCIN) 0.3 % ophthalmic solution INSTILL 2 DROPS INTO EACH EYE 4 TIMES DAILY 5 mL 0   guaiFENesin (MUCINEX) 600 MG 12 hr tablet Take 600 mg by mouth 2 (two) times daily as needed for cough or to loosen phlegm.     ipratropium  (ATROVENT) 0.02 % nebulizer solution Take 0.5 mg by nebulization as needed for wheezing or shortness of breath.     Melatonin 10 MG TABS Take 40 mg by mouth at bedtime.     omeprazole (PRILOSEC) 20 MG capsule Take 20 mg by mouth daily.     PARoxetine (PAXIL) 40 MG tablet Take 1 tablet (40 mg total) by mouth daily. 90 tablet 1   propranolol ER (INDERAL LA) 60 MG 24 hr capsule Take 1 capsule (60 mg total) by mouth daily. 90 capsule 1   traZODone (DESYREL) 100 MG tablet Take 1 tablet (100 mg total) by mouth at bedtime as needed for sleep. 90 tablet 1   No current facility-administered medications on file prior to visit.    Observations/Objective: Patient sounds cheerful and well on the phone. I do not appreciate any SOB. Speech and thought processing are grossly intact. Patient reported vitals:  Assessment and Plan:  Nasal sinus congestion  Facial discomfort  -we discussed possible serious and likely etiologies, options for evaluation and workup, limitations of telemedicine visit vs in person visit, treatment, treatment risks and precautions. Pt prefers to treat via telemedicine empirically rather than in person at this moment.Query 2ndary bacterial sinusitis vs new VURI vs other. She feels strongly about starting an abx after discussed. Opted for doxy 100mg  bid x 7 days. Also advised nasal saline lavage.  Advised to seek prompt virtual visit or in person care if worsening, new symptoms arise, or if is not improving with treatment as expected per our conversation of expected course. Discussed options for follow up care. Did let this patient know that I do telemedicine on Tuesdays and Thursdays for [place] and those are the days I am logged into the system. Advised to schedule follow up visit with PCP, [place] virtual visits or [facility] if any further questions or concerns to avoid delays in care.   I discussed the assessment and treatment plan with the patient. The patient was provided an  opportunity to ask questions and all were answered. The patient agreed with the plan and demonstrated an understanding of the instructions.    Follow Up Instructions:  I did not refer this patient for an OV with me in the next 24 hours for this/these issue(s).  I discussed the assessment and treatment plan with the patient. The patient was provided an opportunity to ask questions and all were answered. The patient agreed with the plan and demonstrated an understanding of the instructions.   I spent 12 minutes on the date of this visit in the care of this patient. See summary of tasks completed to properly care for this patient in the detailed notes above which also included counseling of above, review of PMH, medications, allergies, evaluation of the patient and ordering and/or  instructing patient on testing and care options.     Lucretia Kern, DO

## 2021-10-12 NOTE — Addendum Note (Signed)
Addended by: Lucretia Kern on: 10/12/2021 01:31 PM   Modules accepted: Level of Service

## 2021-10-18 ENCOUNTER — Telehealth: Payer: Self-pay | Admitting: Internal Medicine

## 2021-10-18 ENCOUNTER — Telehealth (HOSPITAL_COMMUNITY): Payer: Self-pay

## 2021-10-18 DIAGNOSIS — J9611 Chronic respiratory failure with hypoxia: Secondary | ICD-10-CM

## 2021-10-18 DIAGNOSIS — J449 Chronic obstructive pulmonary disease, unspecified: Secondary | ICD-10-CM

## 2021-10-18 NOTE — Telephone Encounter (Signed)
Spoke with the pt  She is asking for referral to pulmonary rehab  She states MW mentioned but I do not see in the notes  Please advise thanks!

## 2021-10-18 NOTE — Telephone Encounter (Signed)
Fine with me GOLD 3 copd

## 2021-10-18 NOTE — Telephone Encounter (Signed)
Referral placed and I spoke with the pt and notified this was done.  Nothing further needed.

## 2021-10-18 NOTE — Telephone Encounter (Signed)
Pt called wanting to see about scheduling for pulmonary rehab. I advised pt that we dont have a pulmonary rehab referral for her in the system. I advised pt to contact her pulmonologist office and see if they are able to send over a referral for her pt understood. I also advised pt of the 2-3 month backlog we have right now with pulmonary rehab.

## 2021-10-19 ENCOUNTER — Encounter (HOSPITAL_COMMUNITY): Payer: Self-pay | Admitting: *Deleted

## 2021-10-19 NOTE — Progress Notes (Signed)
Received referral from [facility] for this pt to participate in pulmonary rehab with the diagnosis of COPD Stage 3. Pt completed full PFT on 12/14/19 which showed FEV/FVC 39 and post pred FEV1 40. Clinical review of pt follow up appt on 10/06/21 with Marland Kitchen NP with Dr. Melvyn Novas Pulmonary office note. Also reviewed hospitalization in 08/2021 and brief ER visit on 12/24 with positive COVID-19. Marland Kitchen  Pt with Covid Risk Score - 2. Pt appropriate for scheduling for Pulmonary rehab.  Will forward to support staff for scheduling when able as there is a wait list and verification of insurance eligibility/benefits with pt consent. Cherre Huger, BSN Cardiac and Training and development officer ;

## 2021-10-24 NOTE — Telephone Encounter (Signed)
Adv pt we have her PR referral and she has been cleared. And of PR backlog of 1-3 months, we contact her at a later date for scheduling.

## 2021-10-27 ENCOUNTER — Ambulatory Visit (INDEPENDENT_AMBULATORY_CARE_PROVIDER_SITE_OTHER): Payer: Medicare Other

## 2021-10-27 ENCOUNTER — Ambulatory Visit (INDEPENDENT_AMBULATORY_CARE_PROVIDER_SITE_OTHER): Payer: Medicare Other | Admitting: Nurse Practitioner

## 2021-10-27 ENCOUNTER — Encounter: Payer: Self-pay | Admitting: Nurse Practitioner

## 2021-10-27 ENCOUNTER — Other Ambulatory Visit: Payer: Self-pay

## 2021-10-27 VITALS — BP 100/62 | HR 80 | Temp 98.2°F | Ht 64.0 in | Wt 131.0 lb

## 2021-10-27 DIAGNOSIS — J90 Pleural effusion, not elsewhere classified: Secondary | ICD-10-CM

## 2021-10-27 DIAGNOSIS — J441 Chronic obstructive pulmonary disease with (acute) exacerbation: Secondary | ICD-10-CM | POA: Diagnosis not present

## 2021-10-27 DIAGNOSIS — R0602 Shortness of breath: Secondary | ICD-10-CM

## 2021-10-27 DIAGNOSIS — J309 Allergic rhinitis, unspecified: Secondary | ICD-10-CM | POA: Insufficient documentation

## 2021-10-27 DIAGNOSIS — J9611 Chronic respiratory failure with hypoxia: Secondary | ICD-10-CM

## 2021-10-27 DIAGNOSIS — J45901 Unspecified asthma with (acute) exacerbation: Secondary | ICD-10-CM

## 2021-10-27 DIAGNOSIS — J3089 Other allergic rhinitis: Secondary | ICD-10-CM | POA: Diagnosis not present

## 2021-10-27 HISTORY — DX: Allergic rhinitis, unspecified: J30.9

## 2021-10-27 LAB — CBC WITH DIFFERENTIAL/PLATELET
Absolute Monocytes: 632 cells/uL (ref 200–950)
Basophils Absolute: 61 cells/uL (ref 0–200)
Basophils Relative: 0.9 %
Eosinophils Absolute: 238 cells/uL (ref 15–500)
Eosinophils Relative: 3.5 %
HCT: 39.9 % (ref 35.0–45.0)
Hemoglobin: 13.3 g/dL (ref 11.7–15.5)
Lymphs Abs: 1367 cells/uL (ref 850–3900)
MCH: 28.7 pg (ref 27.0–33.0)
MCHC: 33.3 g/dL (ref 32.0–36.0)
MCV: 86.2 fL (ref 80.0–100.0)
MPV: 10.5 fL (ref 7.5–12.5)
Monocytes Relative: 9.3 %
Neutro Abs: 4502 cells/uL (ref 1500–7800)
Neutrophils Relative %: 66.2 %
Platelets: 334 10*3/uL (ref 140–400)
RBC: 4.63 10*6/uL (ref 3.80–5.10)
RDW: 13 % (ref 11.0–15.0)
Total Lymphocyte: 20.1 %
WBC: 6.8 10*3/uL (ref 3.8–10.8)

## 2021-10-27 LAB — NITRIC OXIDE: Nitric Oxide: 29

## 2021-10-27 MED ORDER — LORATADINE 10 MG PO TABS: 10.0000 mg | ORAL_TABLET | Freq: Every day | ORAL | 3 refills | Status: DC

## 2021-10-27 MED ORDER — ALBUTEROL SULFATE (2.5 MG/3ML) 0.083% IN NEBU
2.5000 mg | INHALATION_SOLUTION | Freq: Once | RESPIRATORY_TRACT | Status: AC
  Administered 2021-10-27: 2.5 mg via RESPIRATORY_TRACT

## 2021-10-27 MED ORDER — MONTELUKAST SODIUM 10 MG PO TABS: 10.0000 mg | ORAL_TABLET | Freq: Every day | ORAL | 3 refills | Status: DC

## 2021-10-27 MED ORDER — PREDNISONE 10 MG PO TABS: ORAL_TABLET | ORAL | 0 refills | Status: DC

## 2021-10-27 MED ORDER — AZELASTINE HCL 0.1 % NA SOLN: 2.0000 | Freq: Two times a day (BID) | NASAL | 6 refills | Status: DC

## 2021-10-27 NOTE — Assessment & Plan Note (Addendum)
No increasing requirements. Stable O2 in office at 98% on 2 lpm. Has an appt for POC qualification with [employer] - advised not to do until flare subsides.

## 2021-10-27 NOTE — Assessment & Plan Note (Signed)
Flare in symptoms. Stable in office with no increasing oxygen requirements. Change from Symbicort to [facility] as Trelegy DPI worsened cough. Financial assistance application provided for [employer]. Prednisone taper. Continue PRN albuterol and ipatropium. FeNO slightly elevated at 29 ppb. CBC with diff and allergen panel ordered.   Patient Instructions  -Change to Breztri 2 puffs Twice daily. Brush tongue and rinse mouth afterwards -Continue Albuterol inhaler 2 puffs every 6 hours as needed for shortness of breath or wheezing. Notify if symptoms persist despite rescue inhaler/neb use. -Continue ipatropium neb every 6 hours as needed for shortness of breath or wheezing -Continue flonase 2 sprays each nostril daily -Continue mucinex 600 mg Twice daily  -Continue omeprazole 20 mg daily -Continue supplemental oxygen at 2 lpm for goal oxygen saturation >88-90%. Notify of increasing requirements  -Prednisone taper. 4 tabs for 2 days, then 3 tabs for 2 days, 2 tabs for 2 days, then 1 tab for 2 days, then stop. Take in AM with food. -Astelin nasal spray 2 sprays each nostril Twice daily  -Claritin (loratidine) 10 mg daily for allergies  Labs today - CBC with diff and allergen panel with IgE  Chest x ray today. We will notify you of any abnormal results.   Asthma Action Plan in place Rinse mouth after inhaled corticosteroid use.  Avoid triggers, when able.  Exercise encouraged. Notify if worsening symptoms upon exertion.  Notify and seek help if symptoms unrelieved by rescue inhaler.  Maintain up to date vaccinations, including influenza, COVID, and pneumococcal.  Wash your hands often and avoid sick exposures.  Encouraged masking in crowds.   Follow up in 2 weeks with Dr. Melvyn Novas or Alanson Aly. If symptoms do not improve or worsen, please contact office for sooner follow up or seek emergency care.

## 2021-10-27 NOTE — Addendum Note (Signed)
Addended by: Dessie Coma on: 10/27/2021 04:06 PM   Modules accepted: Orders

## 2021-10-27 NOTE — Assessment & Plan Note (Signed)
Persistent nasal drainage and allergy type symptoms. Initiate therapy with claritin daily and Astelin nasal spray Twice daily. Continue flonase. Cbc with diff and allergen panel with IgE today. May add Singulair depending on results.

## 2021-10-27 NOTE — Patient Instructions (Addendum)
-  Change to Breztri 2 puffs Twice daily. Brush tongue and rinse mouth afterwards -Continue Albuterol inhaler 2 puffs every 6 hours as needed for shortness of breath or wheezing. Notify if symptoms persist despite rescue inhaler/neb use. -Continue ipatropium neb every 6 hours as needed for shortness of breath or wheezing -Continue flonase 2 sprays each nostril daily -Continue mucinex 600 mg Twice daily  -Continue omeprazole 20 mg daily -Continue supplemental oxygen at 2 lpm for goal oxygen saturation >88-90%. Notify of increasing requirements  -Prednisone taper. 4 tabs for 2 days, then 3 tabs for 2 days, 2 tabs for 2 days, then 1 tab for 2 days, then stop. Take in AM with food. -Astelin nasal spray 2 sprays each nostril Twice daily  -Claritin (loratidine) 10 mg daily for allergies  Labs today - CBC with diff and allergen panel with IgE  Chest x ray today. We will notify you of any abnormal results.   Asthma Action Plan in place Rinse mouth after inhaled corticosteroid use.  Avoid triggers, when able.  Exercise encouraged. Notify if worsening symptoms upon exertion.  Notify and seek help if symptoms unrelieved by rescue inhaler.  Maintain up to date vaccinations, including influenza, COVID, and pneumococcal.  Wash your hands often and avoid sick exposures.  Encouraged masking in crowds.   Follow up in 2 weeks with Dr. Melvyn Novas or Alanson Aly. If symptoms do not improve or worsen, please contact office for sooner follow up or seek emergency care.

## 2021-10-27 NOTE — Assessment & Plan Note (Signed)
Will obtain CXR today to ensure no worsening pleural effusion contributing to SOB. BNP and BMET.

## 2021-10-27 NOTE — Addendum Note (Signed)
Addended by: Dessie Coma on: 10/27/2021 05:33 PM   Modules accepted: Orders

## 2021-10-27 NOTE — Progress Notes (Signed)
Notified patient of CXR results. Verbalized understanding. Continue current plan as discussed earlier.

## 2021-10-27 NOTE — Progress Notes (Signed)
Unchanged trace right pleural effusion. Lungs clear otherwise.

## 2021-10-27 NOTE — Progress Notes (Signed)
@Patient  ID: [name], female    DOB: 1956-01-31, 66 y.o.   MRN: [id number]  Chief Complaint  Patient presents with   Follow-up    She reports that Trelegy is too much and makes her cough.     Referring provider: [name], MD  HPI: 66 year old female, former smoker (40 pack years) followed for COPD Gold 3.  She is a patient Dr. Gustavus Bryant and was last seen in office on 10/06/2021 by [facility] NP.  She is followed by Dr. 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  Past medical history significant for migraines, GERD, chronic hepatitis C, cirrhosis, depression, tremor, tobacco abuse.  TEST/EVENTS:  12/14/2019 PFTs: FVC 2.63 (79), FEV1 1.02 (40), ratio 39, TLC 111%, DLCO corrected 44%. +BD 09/18/2021 CTA chest: No PE. Centrilobular and paraseptal emphysematous lung disease. Mild posterior RUL atelectasis and/or infiltrate. 7 mm nonsolid RUL lung nodule noted on previous exam not well visualized 09/23/2021 CXR 2 view: emphysematous changes with flattening of diaphragm. No acute cardiopulm disease.  09/07/2021: OV with Dr. Melvyn Novas. COPD exacerbation. Tx with prednisone taper and mucinex. Maintained on Symbicort.  09/17/2021-09/20/2021: Hospitalization for COPD exacerbation and acute hypoxic respiratory failure. Tx with abx and steroids. CTA neg for PE. CT chest with RUL airspace infiltrate. COVID and flu neg. D/c on home O2.  09/23/2021: ED visit for SOB and chest tightness. COVID positive. D dimer neg after age adjustment - shared decision to not repeat CTA. Tx with molnupiravir and prednisone burst.   10/06/2021: OV with Makih Stefanko NP for hospital f/u. Improved post discharge and having COVID. Stepped up therapy to Trelegy daily given recent hospitalization and O2 requirement. Added flonase and saline nasal spray for rhinorrhea. Continued 2 lpm supplemental O2 for goal >88-90%. CXR with new small right pleural effusion - given clinical improvement,  no tx and continue to monitor.   10/27/2021: Today - follow up Patient presents today for follow up with worsening shortness of breath over the past few days. She also stopped her Trelegy inhaler as it made her cough and switched back to her Symbicort. She still has a dry cough but it has improved. She reports nasal congestion and rhinorrhea, which has improved with the flonase but not resolved. Her shortness of breath is primarily upon exertion; however, she did become slightly short of breath in the office with conversation. Her oxygen was stable at 98% on 2 lpm and she has not had any increasing requirements at home. She notices occasional wheezing. She denies any fevers, orthopnea, PND, chest pain, or lower extremity swelling.   Allergies  Allergen Reactions   Aspirin Shortness Of Breath   Trelegy Ellipta [Fluticasone-Umeclidin-Vilant] Shortness Of Breath    She reports that is made her cough worse.    Morphine Sulfate Other (See Comments)    RED STREAKS ON ARMS.   Oxycodone Itching and Rash   Glycopyrrolate Other (See Comments)    UNSPECIFIED REACTION  pt unsure if she is allergic.   Lactose Intolerance (Gi) Nausea And Vomiting    Milk only.Patient states that she can eat cheese.   Penicillins Other (See Comments)     Heartburn, upset stomach only & tolerated AMOXIL after this reaction Has patient had a PCN reaction causing immediate rash, facial/tongue/throat swelling, SOB or lightheadedness with hypotension: No Has patient had a PCN reaction causing severe rash involving mucus membranes or skin necrosis: No Has patient had a PCN reaction that required hospitalization: No Has patient  had a PCN reaction occurring within the last 10 years: #  #  #  YES  #  #  #    Sulfa Antibiotics Tinitus    Immunization History  Administered Date(s) Administered   Fluad Quad(high Dose 65+) 06/29/2021   Influenza Split 07/29/2012   Influenza,inj,Quad PF,6+ Mos 06/27/2019, 07/14/2020    Influenza-Unspecified 07/01/2017, 07/09/2017, 07/09/2018   Moderna Sars-Covid-2 Vaccination 01/18/2020, 02/17/2020   Pneumococcal Conjugate-13 09/04/2019   Pneumococcal Polysaccharide-23 11/13/2017   Tdap 01/21/2014    Past Medical History:  Diagnosis Date   Anxiety    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    Hepatitis C    lung ca dx'd 03/2018   Tremor, unspecified    non-specific; takes inderal.     Tobacco History: Social History   Tobacco Use  Smoking Status Former   Packs/day: 1.00   Years: 40.00   Pack years: 40.00   Types: Cigarettes   Quit date: 12/14/2017   Years since quitting: 3.8  Smokeless Tobacco Never   Counseling given: Not Answered   Outpatient Medications Prior to Visit  Medication Sig Dispense Refill   acetaminophen (TYLENOL) 500 MG tablet Take 1,000 mg by mouth every 6 (six) hours as needed for mild pain.     albuterol (VENTOLIN HFA) 108 (90 Base) MCG/ACT inhaler Inhale 2 puffs into the lungs every 6 (six) hours as needed for wheezing or shortness of breath. 8 g 0   fluticasone (FLONASE) 50 MCG/ACT nasal spray Place 1 spray into both nostrils daily. 18.2 mL 2   gentamicin (GARAMYCIN) 0.3 % ophthalmic solution INSTILL 2 DROPS INTO EACH EYE 4 TIMES DAILY 5 mL 0   guaiFENesin (MUCINEX) 600 MG 12 hr tablet Take 600 mg by mouth 2 (two) times daily as needed for cough or to loosen phlegm.     ipratropium (ATROVENT) 0.02 % nebulizer solution Take 0.5 mg by nebulization as needed for wheezing or shortness of breath.     Melatonin 10 MG TABS Take 40 mg by mouth at bedtime.     omeprazole (PRILOSEC) 20 MG capsule Take 20 mg by mouth daily.     PARoxetine (PAXIL) 40 MG tablet Take 1 tablet (40 mg total) by mouth daily. 90 tablet 1   propranolol ER (INDERAL  LA) 60 MG 24 hr capsule Take 1 capsule (60 mg total) by mouth daily. 90 capsule 1   traZODone (DESYREL) 100 MG tablet Take 1 tablet (100 mg total) by mouth at bedtime as needed for sleep. 90 tablet 1   doxycycline (VIBRA-TABS) 100 MG tablet Take 1 tablet (100 mg total) by mouth 2 (two) times daily. (Patient not taking: Reported on 10/27/2021) 14 tablet 0   Fluticasone-Umeclidin-Vilant (TRELEGY ELLIPTA) 100-62.5-25 MCG/ACT AEPB Inhale 1 puff into the lungs daily. (Patient not taking: Reported on 10/27/2021) 1 each 3   Fluticasone-Umeclidin-Vilant (TRELEGY ELLIPTA) 100-62.5-25 MCG/ACT AEPB Inhale 1 puff into the lungs daily. (Patient not taking: Reported on 10/27/2021) 60 each 0   No facility-administered medications prior to visit.     Review of Systems:   Constitutional: No weight loss or gain, night sweats, fevers, chills, fatigue, or lassitude. HEENT: No headaches, difficulty swallowing, tooth/dental problems, or sore throat. No sneezing, itching, ear ache. +  nasal congestion, rhinorrhea, post nasal drip CV:  No chest pain, orthopnea, PND, swelling in lower extremities, anasarca, dizziness, palpitations, syncope Resp: +shortness of breath with exertion; dry cough; occasional wheeze. No excess mucus or change in color of mucus.  No hemoptysis.  No chest wall deformity GI:  No heartburn, indigestion, abdominal pain, nausea, vomiting, diarrhea, change in bowel habits, loss of appetite, bloody stools.  GU: No dysuria, change in color of urine, urgency or frequency.  No flank pain, no hematuria  Skin: No rash, lesions, ulcerations MSK:  No joint pain or swelling.  No decreased range of motion.  No back pain. Neuro: No dizziness or lightheadedness.  Psych: No depression or anxiety. Mood stable.     Physical Exam:  BP 100/62 (BP Location: Left Arm, Patient Position: Sitting, Cuff Size: Normal)    Pulse 80    Temp 98.2 F (36.8 C) (Oral)    Ht 5\' 4"  (1.626 m)    Wt 131 lb (59.4 kg)    SpO2 98%     BMI 22.49 kg/m   GEN: Pleasant, interactive, well-nourished, chronically-ill appearing; in no acute distress. HEENT:  Normocephalic and atraumatic. EACs patent bilaterally. TM pearly gray with present light reflex bilaterally. PERRLA. Sclera white. Nasal turbinates pink, moist and patent bilaterally. No rhinorrhea present. Oropharynx pink and moist, without exudate or edema. No lesions, ulcerations, or postnasal drip.  NECK:  Supple w/ fair ROM. No JVD present. Normal carotid impulses w/o bruits. Thyroid symmetrical with no goiter or nodules palpated. No lymphadenopathy.   CV: RRR, no m/r/g, no peripheral edema. Pulses intact, +2 bilaterally. No cyanosis, pallor or clubbing. PULMONARY:  Unlabored, regular breathing. Diminished breath sounds posteriorly; clear anteriorly w/o wheezes/rales/rhonchi. No accessory muscle use. No dullness to percussion. GI: BS present and normoactive. Soft, non-tender to palpation. No organomegaly or masses detected. No CVA tenderness. MSK: No erythema, warmth or tenderness. Cap refil <2 sec all extrem. No deformities or joint swelling noted.  Neuro: A/Ox3. No focal deficits noted.   Skin: Warm, no lesions or rashe Psych: Normal affect and behavior. Judgement and thought content appropriate.     Lab Results:  CBC    Component Value Date/Time   WBC 10.6 (H) 09/23/2021 1559   RBC 5.26 (H) 09/23/2021 1559   HGB 14.9 09/23/2021 1559   HGB 14.6 04/10/2021 0928   HCT 46.7 (H) 09/23/2021 1559   PLT 326 09/23/2021 1559   PLT 292 04/10/2021 0928   MCV 88.8 09/23/2021 1559   MCH 28.3 09/23/2021 1559   MCHC 31.9 09/23/2021 1559   RDW 12.4 09/23/2021 1559   LYMPHSABS 1.0 09/23/2021 1559   MONOABS 0.8 09/23/2021 1559   EOSABS 0.3 09/23/2021 1559   BASOSABS 0.0 09/23/2021 1559    BMET    Component Value Date/Time   NA 135 09/23/2021 1559   K 3.8 09/23/2021 1559   CL 100 09/23/2021 1559   CO2 28 09/23/2021 1559   GLUCOSE 102 (H) 09/23/2021 1559   BUN 19  09/23/2021 1559   CREATININE 0.74 09/23/2021 1559   CREATININE 0.83 04/10/2021 0928   CREATININE 0.92 07/14/2020 1059   CALCIUM 9.3 09/23/2021 1559   GFRNONAA >60 09/23/2021 1559   GFRNONAA >60 04/10/2021 0928   GFRNONAA 66 07/14/2020 1059   GFRAA 76 07/14/2020 1059    BNP    Component Value Date/Time   BNP 83.9 09/18/2021 0020     Imaging:  DG Chest 2 View  Result Date: 10/06/2021 CLINICAL DATA:  Productive cough. EXAM:  CHEST - 2 VIEW COMPARISON:  September 23, 2021. FINDINGS: The heart size and mediastinal contours are within normal limits. Postsurgical change in scarring is seen in left lung. Small right pleural effusion is noted. No pneumothorax is noted. No acute consolidative process is noted. The visualized skeletal structures are unremarkable. IMPRESSION: Small right pleural effusion. No acute pulmonary abnormality is noted. Electronically Signed   By: Marijo Conception M.D.   On: 10/06/2021 15:08      PFT Results Latest Ref Rng & Units 12/14/2019 03/11/2018  FVC-Pre L 2.01 1.86  FVC-Predicted Pre % 60 55  FVC-Post L 2.63 2.47  FVC-Predicted Post % 79 73  Pre FEV1/FVC % % 42 52  Post FEV1/FCV % % 39 52  FEV1-Pre L 0.85 0.97  FEV1-Predicted Pre % 33 37  FEV1-Post L 1.02 1.28  DLCO uncorrected ml/min/mmHg 10.06 10.99  DLCO UNC% % 48 42  DLCO corrected ml/min/mmHg 9.24 10.54  DLCO COR %Predicted % 44 41  DLVA Predicted % 55 54  TLC L 5.78 5.37  TLC % Predicted % 111 103  RV % Predicted % 168 144    No results found for: NITRICOXIDE      Assessment & Plan:   Acute exacerbation of COPD with asthma (HCC) Flare in symptoms. Stable in office with no increasing oxygen requirements. Change from Symbicort to [facility] as Trelegy DPI worsened cough. Financial assistance application provided for [employer]. Prednisone taper. Continue PRN albuterol and ipatropium. FeNO slightly elevated at 29 ppb. CBC with diff and allergen panel ordered.   Patient Instructions  -Change to  Breztri 2 puffs Twice daily. Brush tongue and rinse mouth afterwards -Continue Albuterol inhaler 2 puffs every 6 hours as needed for shortness of breath or wheezing. Notify if symptoms persist despite rescue inhaler/neb use. -Continue ipatropium neb every 6 hours as needed for shortness of breath or wheezing -Continue flonase 2 sprays each nostril daily -Continue mucinex 600 mg Twice daily  -Continue omeprazole 20 mg daily -Continue supplemental oxygen at 2 lpm for goal oxygen saturation >88-90%. Notify of increasing requirements  -Prednisone taper. 4 tabs for 2 days, then 3 tabs for 2 days, 2 tabs for 2 days, then 1 tab for 2 days, then stop. Take in AM with food. -Astelin nasal spray 2 sprays each nostril Twice daily  -Claritin (loratidine) 10 mg daily for allergies  Labs today - CBC with diff and allergen panel with IgE  Chest x ray today. We will notify you of any abnormal results.   Asthma Action Plan in place Rinse mouth after inhaled corticosteroid use.  Avoid triggers, when able.  Exercise encouraged. Notify if worsening symptoms upon exertion.  Notify and seek help if symptoms unrelieved by rescue inhaler.  Maintain up to date vaccinations, including influenza, COVID, and pneumococcal.  Wash your hands often and avoid sick exposures.  Encouraged masking in crowds.   Follow up in 2 weeks with Dr. Melvyn Novas or Alanson Aly. If symptoms do not improve or worsen, please contact office for sooner follow up or seek emergency care.   Allergic rhinitis Persistent nasal drainage and allergy type symptoms. Initiate therapy with claritin daily and Astelin nasal spray Twice daily. Continue flonase. Cbc with diff and allergen panel with IgE today. May add Singulair depending on results.   Pleural effusion Will obtain CXR today to ensure no worsening pleural effusion contributing to SOB. BNP and BMET.   Chronic respiratory failure with hypoxia (HCC) No increasing requirements. Stable O2 in  office at  98% on 2 lpm. Has an appt for POC qualification with [employer] - advised not to do until flare subsides.    Clayton Bibles, NP 10/27/2021  Pt aware and understands NP's role.

## 2021-10-28 LAB — BRAIN NATRIURETIC PEPTIDE: Brain Natriuretic Peptide: 78 pg/mL (ref <100)

## 2021-10-28 LAB — BASIC METABOLIC PANEL
BUN: 11 mg/dL (ref 7–25)
CO2: 31 mmol/L (ref 20–32)
Calcium: 9.8 mg/dL (ref 8.6–10.4)
Chloride: 100 mmol/L (ref 98–110)
Creat: 0.73 mg/dL (ref 0.50–1.05)
Glucose, Bld: 107 mg/dL — ABNORMAL HIGH (ref 65–99)
Potassium: 4.6 mmol/L (ref 3.5–5.3)
Sodium: 140 mmol/L (ref 135–146)

## 2021-11-01 LAB — ALLERGEN PANEL (27) + IGE
Alternaria Alternata IgE: <0.1 kU/L
Aspergillus Fumigatus IgE: <0.1 kU/L
Bahia Grass IgE: <0.1 kU/L
Bermuda Grass IgE: <0.1 kU/L
Cat Dander IgE: <0.1 kU/L
Cedar, Mountain IgE: <0.1 kU/L
Cladosporium Herbarum IgE: <0.1 kU/L
Cocklebur IgE: <0.1 kU/L
Cockroach, American IgE: <0.1 kU/L
Common Silver Birch IgE: <0.1 kU/L
D Farinae IgE: 0.72 kU/L — AB
D Pteronyssinus IgE: 1.11 kU/L — AB
Dog Dander IgE: 0.21 kU/L — AB
Elm, American IgE: <0.1 kU/L
Hickory, White IgE: <0.1 kU/L
IgE (Immunoglobulin E), Serum: 31 IU/mL (ref 6–495)
Johnson Grass IgE: <0.1 kU/L
Kentucky Bluegrass IgE: <0.1 kU/L
Maple/Box Elder IgE: <0.1 kU/L
Mucor Racemosus IgE: <0.1 kU/L
Oak, White IgE: <0.1 kU/L
Penicillium Chrysogen IgE: <0.1 kU/L
Pigweed, Rough IgE: <0.1 kU/L
Plantain, English IgE: <0.1 kU/L
Ragweed, Short IgE: <0.1 kU/L
Setomelanomma Rostrat: <0.1 kU/L
Timothy Grass IgE: <0.1 kU/L
White Mulberry IgE: <0.1 kU/L

## 2021-11-01 NOTE — Progress Notes (Signed)
Notified patient allergen panel positive for dust mites and low positive to dog dander. Eos slightly elevated at 238. Previously started on Claritin and Singulair. Reports that since starting these, she has not had any wheezing and her shortness of breath has significantly improved. Pt verbalized understanding of results. Will follow up in office 2/10 as scheduled.

## 2021-11-10 ENCOUNTER — Ambulatory Visit: Payer: Medicare Other | Admitting: Nurse Practitioner

## 2021-11-21 ENCOUNTER — Telehealth: Payer: Self-pay | Admitting: Nurse Practitioner

## 2021-11-21 NOTE — Telephone Encounter (Signed)
Received a patient assistance application in the mail from patient. I have filled out the office portion and placed on Katie's computer for her signature. Once it has been signed, I will fax the application to [employer].

## 2021-11-22 NOTE — Telephone Encounter (Signed)
Signed and ready! Thanks!

## 2021-11-23 NOTE — Telephone Encounter (Signed)
Called and spoke with patient. She is aware that the application has been completed and faxed today.

## 2021-11-30 NOTE — Telephone Encounter (Signed)
Received a fax from  [place] regarding an approval for  [name]  patient assistance from 11/29/2021 to 09/30/2022. Approval letter sent to scan center. ?

## 2021-12-15 ENCOUNTER — Ambulatory Visit: Payer: Medicare Other | Admitting: Nurse Practitioner

## 2021-12-19 ENCOUNTER — Telehealth (HOSPITAL_COMMUNITY): Payer: Self-pay

## 2021-12-19 NOTE — Telephone Encounter (Signed)
Pt insurance is active and benefits verified through Medicare a/b Co-pay 0, DED $226/$226 met, out of pocket 0/0 met, co-insurance 20%. no pre-authorization required ? ?2ndary insurance is active and benefits verified through [employer]. Co-pay 0, DED 0/0 met, out of pocket 0/0 met, co-insurance 0%. No pre-authorization required.  ?

## 2021-12-21 ENCOUNTER — Encounter: Payer: Self-pay | Admitting: Nurse Practitioner

## 2021-12-21 ENCOUNTER — Other Ambulatory Visit: Payer: Self-pay

## 2021-12-21 ENCOUNTER — Ambulatory Visit (INDEPENDENT_AMBULATORY_CARE_PROVIDER_SITE_OTHER): Payer: Medicare Other | Admitting: Nurse Practitioner

## 2021-12-21 ENCOUNTER — Ambulatory Visit (INDEPENDENT_AMBULATORY_CARE_PROVIDER_SITE_OTHER): Payer: Medicare Other

## 2021-12-21 VITALS — BP 124/88 | HR 77 | Ht 65.0 in | Wt 145.8 lb

## 2021-12-21 DIAGNOSIS — J9611 Chronic respiratory failure with hypoxia: Secondary | ICD-10-CM

## 2021-12-21 DIAGNOSIS — J45901 Unspecified asthma with (acute) exacerbation: Secondary | ICD-10-CM | POA: Diagnosis not present

## 2021-12-21 DIAGNOSIS — J441 Chronic obstructive pulmonary disease with (acute) exacerbation: Secondary | ICD-10-CM

## 2021-12-21 DIAGNOSIS — J3089 Other allergic rhinitis: Secondary | ICD-10-CM | POA: Diagnosis not present

## 2021-12-21 DIAGNOSIS — J90 Pleural effusion, not elsewhere classified: Secondary | ICD-10-CM | POA: Diagnosis not present

## 2021-12-21 DIAGNOSIS — J449 Chronic obstructive pulmonary disease, unspecified: Secondary | ICD-10-CM | POA: Diagnosis not present

## 2021-12-21 DIAGNOSIS — C3492 Malignant neoplasm of unspecified part of left bronchus or lung: Secondary | ICD-10-CM

## 2021-12-21 LAB — POCT EXHALED NITRIC OXIDE: FeNO level (ppb): 21

## 2021-12-21 MED ORDER — PREDNISONE 20 MG PO TABS: 40.0000 mg | ORAL_TABLET | Freq: Every day | ORAL | 0 refills | Status: AC

## 2021-12-21 NOTE — Assessment & Plan Note (Signed)
Significant improvement with initiation of Claritin and Singulair. Continue for trigger prevention and postnasal drainage control. ?

## 2021-12-21 NOTE — Patient Instructions (Addendum)
-  Restart Breztri 2 puffs Twice daily. Brush tongue and rinse mouth afterwards ?-Continue Albuterol inhaler 2 puffs every 6 hours as needed for shortness of breath or wheezing. Notify if symptoms persist despite rescue inhaler/neb use. ?-Continue flonase 2 sprays each nostril daily ?-Continue mucinex 600 mg Twice daily  ?-Continue omeprazole 20 mg daily ?-Continue supplemental oxygen at 2 lpm for goal oxygen saturation >88-90%. Notify of increasing requirements ?-Continue Astelin nasal spray 2 sprays each nostril Twice daily  ?-Continue Claritin (loratidine) 10 mg daily for allergies ?-Continue singulair 10 mg At bedtime  ? ?-Prednisone 40 mg daily for 5 days. Take in AM with food.  ?  ?Chest x ray today. We will notify you of any abnormal results.  ?  ?Follow up in 2 weeks with Dr. Melvyn Novas or Alanson Aly. If symptoms do not improve or worsen, please contact office for sooner follow up or seek emergency care. ?

## 2021-12-21 NOTE — Assessment & Plan Note (Signed)
S/p wedge resection. Followed by Dr. Julien Nordmann with appt scheduled for July 2023.  ?

## 2021-12-21 NOTE — Assessment & Plan Note (Signed)
No increasing requirements. Stable O2 in office at 98% on 2 lpm POC. Sent order to DME to add humidifcation for home O2 concentrator for dry nasal passages. Goal SpO2 >88-90% ?

## 2021-12-21 NOTE — Assessment & Plan Note (Addendum)
Flare in symptoms; suspect likely related to her running out of Breztri. Stable in office with no increasing oxygen requirements. FeNO nl; mild bronchospasm on exam. Prednisone taper. Continue PRN albuterol. CXR to rule out superimposed infection/acute process. Starting pulm rehab soon. ? ?Patient Instructions  ?-Restart Breztri 2 puffs Twice daily. Brush tongue and rinse mouth afterwards ?-Continue Albuterol inhaler 2 puffs every 6 hours as needed for shortness of breath or wheezing. Notify if symptoms persist despite rescue inhaler/neb use. ?-Continue flonase 2 sprays each nostril daily ?-Continue mucinex 600 mg Twice daily  ?-Continue omeprazole 20 mg daily ?-Continue supplemental oxygen at 2 lpm for goal oxygen saturation >88-90%. Notify of increasing requirements ?-Continue Astelin nasal spray 2 sprays each nostril Twice daily  ?-Continue Claritin (loratidine) 10 mg daily for allergies ?-Continue singulair 10 mg At bedtime  ? ?-Prednisone 40 mg daily for 5 days. Take in AM with food.  ?  ?Chest x ray today. We will notify you of any abnormal results.  ?  ?Follow up in 2 weeks with Dr. Melvyn Novas or Alanson Aly. If symptoms do not improve or worsen, please contact office for sooner follow up or seek emergency care. ? ? ?

## 2021-12-21 NOTE — Assessment & Plan Note (Signed)
Stable on previous imaging. Re-evaluate today with CXR.  ?

## 2021-12-21 NOTE — Progress Notes (Signed)
? ?@Patient  ID: Tommie Raymond, female    DOB: 1956-02-07, 66 y.o.   MRN: [id number] ? ?Chief Complaint  ?Patient presents with  ? Follow-up  ?  SOB has worsened   ? ? ?Referring provider: ?[name], MD ? ?HPI: ?66 year old female, former smoker (40 pack years) followed for COPD Gold 3. She is a patient of Dr. Gustavus Bryant and was last seen in office on 10/27/2021 by [facility] NP. She is also followed by Dr. 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 Past medical history significant for migraiens, GERD, chronic hepatitis C, cirrhosis, depression, tremor, tobacco abuse.  ? ?TEST/EVENTS:  ?12/14/2019 PFTs: FVC 2.63 (79), FEV1 1.02 (40), ratio 39, TLC 111%, DLCOcor 44%. +BD ?09/18/2021 CTA chest: No PE. Centrilobular and paraseptal emphysematous lung disease.  ?10/27/2021 CXR 2 view: lungs are clear. Trace right pleural effusion was stable when compared to prior exam ?10/27/2021: RAST positive for dust mites and low positive for dog dander; Eos 238 ? ?09/17/2021-09/20/2021: Hospitalization for COPD exacerbation and acute hypoxic respiratory failure. Tx with abx and steroids. CTA neg for PE. CT chest with RUL airspace infiltrate. COVID and flu neg. D/c on home O2. ?  ?09/23/2021: ED visit for SOB and chest tightness. COVID positive. D dimer neg after age adjustment - shared decision to not repeat CTA. Tx with molnupiravir and prednisone burst.  ?  ?10/06/2021: OV with Liliane Mallis NP for hospital f/u. Improved post discharge and having COVID. Stepped up therapy to Trelegy daily given recent hospitalization and O2 requirement. Added flonase and saline nasal spray for rhinorrhea. Continued 2 lpm supplemental O2 for goal >88-90%. CXR with new small right pleural effusion - given clinical improvement, no tx and continue to monitor ? ?10/27/2021: OV with Mohamad Bruso NP. Worsening SOB. Stopped Trelegy due to worsening dry cough and started back on Symbicort. Her breathing had felt a little better on  Trelegy. She reported nasal congestion and drainage without significant improvement with flonase. Oxygen was stable on 2 lpm. CBC with diff showed elevated eosinophils and RAST was positive for dust mites and dog dander. She was started on Singulair and claritin. Switched to [employer] as DPI likely exacerbated cough. Prednisone taper for AECOPD/asthma. Added on astelin for rhinitis. CXR was clear with trace, stable right pleural effusion  ? ?12/21/2021: Today - acute ?Patient presents today for worsening shortness of breath over the past week or two. Since being seen last, she ran out of her Judithann Sauger and was just able to get restarted on it 3-4 days ago. She has a minimal, dry cough that she feels like is unchanged from her baseline. Her sinus symptoms have improved since we saw her last. She also feels like the singulair has helped a lot. Some occasional wheezing. Denies leg swelling, orthopnea or PND. No increased oxygen demand and sats stable in the 90's on 2 lpm. She does feel like her nose is getting dried out by her oxygen.  ? ?Allergies  ?Allergen Reactions  ? Aspirin Shortness Of Breath  ? Trelegy Ellipta [Fluticasone-Umeclidin-Vilant] Shortness Of Breath  ?  She reports that is made her cough worse.   ? Morphine Sulfate Other (See Comments)  ?  RED STREAKS ON ARMS.  ? Oxycodone Itching and Rash  ? Glycopyrrolate Other (See Comments)  ?  UNSPECIFIED REACTION  ?pt unsure if she is allergic.  ? Lactose Intolerance (Gi) Nausea And Vomiting  ?  Milk only.Patient states that she can eat cheese.  ?  Penicillins Other (See Comments)  ?   Heartburn, upset stomach only & tolerated AMOXIL after this reaction ?Has patient had a PCN reaction causing immediate rash, facial/tongue/throat swelling, SOB or lightheadedness with hypotension: No ?Has patient had a PCN reaction causing severe rash involving mucus membranes or skin necrosis: No ?Has patient had a PCN reaction that required hospitalization: No ?Has patient had a PCN  reaction occurring within the last 10 years: #  #  #  YES  #  #  #   ? Sulfa Antibiotics Tinitus  ? ? ?Immunization History  ?Administered Date(s) Administered  ? Fluad Quad(high Dose 65+) 06/29/2021  ? Influenza Split 07/29/2012  ? Influenza,inj,Quad PF,6+ Mos 06/27/2019, 07/14/2020  ? Influenza-Unspecified 07/01/2017, 07/09/2017, 07/09/2018  ? Moderna Sars-Covid-2 Vaccination 01/18/2020, 02/17/2020  ? Pneumococcal Conjugate-13 09/04/2019  ? Pneumococcal Polysaccharide-23 11/13/2017  ? Tdap 01/21/2014  ? ? ?Past Medical History:  ?Diagnosis Date  ? Anxiety   ? 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 Bronchitis   ? Depression   ? Dyspnea   ? Emphysema, unspecified ([place])   ? per patient mild case  ? Family history of adverse reaction to anesthesia   ? patient states sister has a hard time waking up from anesthesia.   ? Hepatitis C   ? lung ca dx'd 03/2018  ? Tremor, unspecified   ? non-specific; takes inderal.   ? ? ?Tobacco History: ?Social History  ? ?Tobacco Use  ?Smoking Status Former  ? Packs/day: 1.00  ? Years: 40.00  ? Pack years: 40.00  ? Types: Cigarettes  ? Quit date: 12/14/2017  ? Years since quitting: 4.0  ?Smokeless Tobacco Never  ? ?Counseling given: Not Answered ? ? ?Outpatient Medications Prior to Visit  ?Medication Sig Dispense Refill  ? acetaminophen (TYLENOL) 500 MG tablet Take 1,000 mg by mouth every 6 (six) hours as needed for mild pain.    ? albuterol (VENTOLIN HFA) 108 (90 Base) MCG/ACT inhaler Inhale 2 puffs into the lungs every 6 (six) hours as needed for wheezing or shortness of breath. 8 g 0  ? azelastine (ASTELIN) 0.1 % nasal spray Place 2 sprays into both nostrils 2 (two) times daily. Use in each nostril as directed 30 mL 6  ? fluticasone (FLONASE) 50 MCG/ACT nasal spray Place 1 spray into both nostrils daily. 18.2 mL 2  ? guaiFENesin (MUCINEX) 600 MG 12 hr tablet Take 600 mg by mouth 2 (two) times daily as needed for cough or to loosen phlegm.    ? ipratropium (ATROVENT) 0.02 % nebulizer solution  Take 0.5 mg by nebulization as needed for wheezing or shortness of breath.    ? loratadine (CLARITIN) 10 MG tablet Take 1 tablet (10 mg total) by mouth daily. 30 tablet 3  ? Melatonin 10 MG TABS Take 40 mg by mouth at bedtime.    ? omeprazole (PRILOSEC) 20 MG capsule Take 20 mg by mouth daily.    ? PARoxetine (PAXIL) 40 MG tablet Take 1 tablet (40 mg total) by mouth daily. 90 tablet 1  ? propranolol ER (INDERAL LA) 60 MG 24 hr capsule Take 1 capsule (60 mg total) by mouth daily. 90 capsule 1  ? traZODone (DESYREL) 100 MG tablet Take 1 tablet (100 mg total) by mouth at bedtime as needed for sleep. 90 tablet 1  ? gentamicin (GARAMYCIN) 0.3 % ophthalmic solution INSTILL 2 DROPS INTO EACH EYE 4 TIMES DAILY (Patient not taking: Reported on 12/21/2021) 5 mL 0  ? predniSONE (DELTASONE) 10 MG  tablet 4 tabs for 2 days, then 3 tabs for 2 days, 2 tabs for 2 days, then 1 tab for 2 days, then stop (Patient not taking: Reported on 12/21/2021) 20 tablet 0  ? ?No facility-administered medications prior to visit.  ? ? ? ?Review of Systems:  ? ?Constitutional: No weight loss or gain, night sweats, fevers, chills, fatigue, or lassitude. ?HEENT: No headaches, difficulty swallowing, tooth/dental problems, or sore throat. No sneezing, itching, ear ache, nasal congestion, or post nasal drip ?CV:  No chest pain, orthopnea, PND, swelling in lower extremities, anasarca, dizziness, palpitations, syncope ?Resp: +shortness of breath with exertion (increased); minimal dry cough; occasional wheeze. No excess mucus or change in color of mucus.  No hemoptysis. No chest wall deformity ?GI:  No heartburn, indigestion, abdominal pain, nausea, vomiting, diarrhea, change in bowel habits, loss of appetite, bloody stools.  ?GU: No dysuria, change in color of urine, urgency or frequency.  No flank pain, no hematuria  ?Skin: No rash, lesions, ulcerations ?MSK:  No joint pain or swelling.  No decreased range of motion.  No back pain. ?Neuro: No dizziness or  lightheadedness.  ?Psych: No depression or anxiety. Mood stable.  ? ? ? ?Physical Exam: ? ?BP 124/88 (BP Location: Right Arm, Cuff Size: Normal)   Pulse 77   Ht 5\' 5"  (1.651 m)   Wt 145 lb 12.8 oz (66.1 kg)   S

## 2021-12-25 NOTE — Progress Notes (Signed)
Please notify patient no evidence of pneumonia/superimposed infection or other acute process. Chronic findings consistent with COPD and stable.

## 2022-01-02 ENCOUNTER — Telehealth (HOSPITAL_COMMUNITY): Payer: Self-pay

## 2022-01-02 NOTE — Telephone Encounter (Signed)
Called to confirm orientation appointment tomorrow. [name] confirmed. Instructed her on proper footwear and directions to department. Gave her department number.  ?

## 2022-01-03 ENCOUNTER — Encounter (HOSPITAL_COMMUNITY)
Admission: RE | Admit: 2022-01-03 | Discharge: 2022-01-03 | Disposition: A | Discharge status: Home / Self Care | Payer: Medicare Other | Source: Ambulatory Visit | Attending: Internal Medicine | Admitting: Internal Medicine

## 2022-01-03 VITALS — BP 114/60 | HR 87 | Ht 65.0 in | Wt 145.5 lb

## 2022-01-03 DIAGNOSIS — J449 Chronic obstructive pulmonary disease, unspecified: Secondary | ICD-10-CM | POA: Insufficient documentation

## 2022-01-03 NOTE — Progress Notes (Signed)
[name] 66 y.o. female ?Pulmonary Rehab Orientation Note ?This patient who was referred to Pulmonary Rehab by Dr. Melvyn Novas with the diagnosis of Stage 3 COPD arrived today in Cardiac and Pulmonary Rehab. She  arrived with ambulatory normal gait. She  does carry portable oxygen. [employer] is the provider for their DME. Per pt, [name] uses oxygen continuously since a hospitalization in December 2022 with pneumonia. Color good, skin warm and dry. Patient is oriented to time and place. Patient's medical history, psychosocial health, and medications reviewed. Psychosocial assessment reveals pt lives with a roommate.  [name] is currently retired. Pt hobbies include watching tv. Pt reports her stress level is moderate. Areas of stress/anxiety include  the recent passing of her husband who had been in a nursing home the last 1 year . Pt does not exhibit signs of depression. She does take paxil for a mood stabilizer, states she gets along with people easier when she takes Paxil.  She also takes melatonin and desyrel to help her fall asleep and stay asleep.  PHQ2/9 score 0/0. [place] shows good  coping skills with positive outlook on life. Offered emotional support and reassurance. Will continue to monitor and evaluate for psychosocial barriers or concerns while participating in pulmonary rehab. Discussed the grieving process and she will discuss with her PCP to talk with a therapist. Physical assessment reveals heart rate is normal, breath sounds clear to auscultation, no wheezes, rales, or rhonchi. Grip strength equal, strong. Distal pulses present. [name] reports she does take medications as prescribed. Patient states she follows a regular  diet. The patient has been trying to gain weight by eating more chocolate and sweets.  She lost approx. 30 pounds with her last hospitalization in December 2022 and has gained 25 pounds back.. Pt's weight will be monitored closely. Demonstration and practice of PLB using pulse  oximeter. [name] able to return demonstration satisfactorily. Safety and hand hygiene in the exercise area reviewed with patient. [name] voices understanding of the information reviewed. Department expectations discussed with patient and achievable goals were set. The patient shows enthusiasm about attending the program and we look forward to working with [name]. [name] completed a 6 min walk test today and is scheduled to begin exercise on 01/09/2022 at 1:15 pm.  ? ?[id number] ?[name] ?  ?

## 2022-01-03 NOTE — Progress Notes (Signed)
[name] 66 y.o. female ? ?Initial Psychosocial Assessment ? ?Pt psychosocial assessment reveals pt lives with an adult companion. She has a roommate to ease finances. Pt is currently retired. Pt hobbies include watching tv. Pt reports her stress level is moderate. Areas of stress/anxiety include Family Finances. Her husband died 57 month ago and he was in a nursing home for 1 year.  She is trying to make adjustments financially since her husband died and settle the estate.  Pt does not exhibit signs of depression, but takes Paxil to stabilize her mood, which works well for her.Pt shows good  coping skills with positive outlook . Offered emotional support and reassurance. Monitor and evaluate progress toward psychosocial goal(s). ? ?Goal(s): ?Improved management of stress ?Improved coping skills ?Help patient work toward returning to meaningful activities that improve patient's QOL and are attainable with patient's lung disease ? ? ?01/03/2022 12:55 PM ?  ?

## 2022-01-03 NOTE — Progress Notes (Addendum)
Pulmonary Individual Treatment Plan ? ?Patient Details  ?Name: [name] ?MRN: [id number] ?Date of Birth: Jul 02, 1956 ?Referring Provider:   ?Flowsheet Row Pulmonary Rehab Walk Test from 01/03/2022 in [place]  ?Referring Provider Wert  ? ?  ? ? ?Initial Encounter Date:  ?Flowsheet Row Pulmonary Rehab Walk Test from 01/03/2022 in [place]  ?Date 01/03/22  ? ?  ? ? ?Visit Diagnosis: Stage 3 severe COPD by GOLD classification ([place]) ? ?Patient's Home Medications on Admission:  ? ?Current Outpatient Medications:  ?  acetaminophen (TYLENOL) 500 MG tablet, Take 1,000 mg by mouth every 6 (six) hours as needed for mild pain., Disp: , Rfl:  ?  albuterol (VENTOLIN HFA) 108 (90 Base) MCG/ACT inhaler, Inhale 2 puffs into the lungs every 6 (six) hours as needed for wheezing or shortness of breath., Disp: 8 g, Rfl: 0 ?  azelastine (ASTELIN) 0.1 % nasal spray, Place 2 sprays into both nostrils 2 (two) times daily. Use in each nostril as directed, Disp: 30 mL, Rfl: 6 ?  fluticasone (FLONASE) 50 MCG/ACT nasal spray, Place 1 spray into both nostrils daily., Disp: 18.2 mL, Rfl: 2 ?  guaiFENesin (MUCINEX) 600 MG 12 hr tablet, Take 600 mg by mouth 2 (two) times daily as needed for cough or to loosen phlegm., Disp: , Rfl:  ?  ipratropium (ATROVENT) 0.02 % nebulizer solution, Take 0.5 mg by nebulization as needed for wheezing or shortness of breath., Disp: , Rfl:  ?  loratadine (CLARITIN) 10 MG tablet, Take 1 tablet (10 mg total) by mouth daily., Disp: 30 tablet, Rfl: 3 ?  Melatonin 10 MG TABS, Take 40 mg by mouth at bedtime., Disp: , Rfl:  ?  omeprazole (PRILOSEC) 20 MG capsule, Take 20 mg by mouth daily., Disp: , Rfl:  ?  PARoxetine (PAXIL) 40 MG tablet, Take 1 tablet (40 mg total) by mouth daily., Disp: 90 tablet, Rfl: 1 ?  propranolol ER (INDERAL LA) 60 MG 24 hr capsule, Take 1 capsule (60 mg total) by mouth daily., Disp: 90 capsule, Rfl: 1 ?  traZODone (DESYREL) 100  MG tablet, Take 1 tablet (100 mg total) by mouth at bedtime as needed for sleep., Disp: 90 tablet, Rfl: 1 ? ?Past Medical History: ?Past Medical History:  ?Diagnosis Date  ? Anxiety   ? 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 Bronchitis   ? Depression   ? Dyspnea   ? Emphysema, unspecified ([place])   ? per patient mild case  ? Family history of adverse reaction to anesthesia   ? patient states sister has a hard time waking up from anesthesia.   ? Hepatitis C   ? lung ca dx'd 03/2018  ? Tremor, unspecified   ? non-specific; takes inderal.   ? ? ?Tobacco Use: ?Social History  ? ?Tobacco Use  ?Smoking Status Former  ? Packs/day: 1.00  ? Years: 40.00  ? Pack years: 40.00  ? Types: Cigarettes  ? Quit date: 12/14/2017  ? Years since quitting: 4.0  ?Smokeless Tobacco Never  ? ? ?Labs: ?Review Flowsheet   ? ?  ?  Latest Ref Rng & Units 08/11/2018 09/04/2019 12/18/2019 12/19/2019  ?Labs for ITP Cardiac and Pulmonary Rehab  ?Cholestrol <200 mg/dL 245   242      ?LDL (calc) mg/dL (calc)  149      ?Direct LDL mg/dL 173.0       ?HDL-C > OR = 50 mg/dL 53.40   50      ?  Trlycerides <150 mg/dL 342.0   260      ?Hemoglobin A1c <5.7 % of total Hgb  5.3      ?PH, Arterial 7.350 - 7.450   7.362   7.389    ?PCO2 arterial 32.0 - 48.0 mmHg   45.4   40.7    ?Bicarbonate 20.0 - 28.0 mmol/L   25.1   24.0    ?Acid-base deficit 0.0 - 2.0 mmol/L    0.3    ?O2 Saturation %   99.0   95.6    ? ?  09/18/2021  ?Labs for ITP Cardiac and Pulmonary Rehab  ?Cholestrol   ?LDL (calc)   ?Direct LDL   ?HDL-C   ?Trlycerides   ?Hemoglobin A1c   ?PH, Arterial   ?PCO2 arterial   ?Bicarbonate 23.1    ?Acid-base deficit 0.3    ?O2 Saturation 87.4    ?  ? ? Multiple values from one day are sorted in reverse-chronological order  ?  ?  ? ? ?Capillary Blood Glucose: ?Lab Results  ?Component Value Date  ? GLUCAP 105 (H) 12/03/2019  ? GLUCAP 109 (H) 02/19/2018  ? ? ? ?Pulmonary Assessment Scores: ? Pulmonary Assessment Scores   ? ? [place] Name 01/03/22 2025  ?  ?  ?  ? ADL UCSD  ? ADL  Phase Entry    ? SOB Score total 35    ?  ? CAT Score  ? CAT Score 22    ? ?  ?  ? ?  ? ?UCSD: ?Self-administered rating of dyspnea associated with activities of daily living (ADLs) ?6-point scale (0 = "not at all" to 5 = "maximal or unable to do because of breathlessness")  ?Scoring Scores range from 0 to 120.  Minimally important difference is 5 units ? ?CAT: ?CAT can identify the health impairment of COPD patients and is better correlated with disease progression.  ?CAT has a scoring range of zero to 40. The CAT score is classified into four groups of low (less than 10), medium (10 - 20), high (21-30) and very high (31-40) based on the impact level of disease on health status. A CAT score over 10 suggests significant symptoms.  A worsening CAT score could be explained by an exacerbation, poor medication adherence, poor inhaler technique, or progression of COPD or comorbid conditions.  ?CAT MCID is 2 points ? ?mMRC: ?mMRC (Modified Medical Research Council) Dyspnea Scale is used to assess the degree of baseline functional disability in patients of respiratory disease due to dyspnea. ?No minimal important difference is established. A decrease in score of 1 point or greater is considered a positive change.  ? ?Pulmonary Function Assessment: ? Pulmonary Function Assessment - 01/03/22 0936   ? ?  ? Breath  ? Shortness of Breath Fear of Shortness of Breath;Limiting activity;Yes   ? ?  ?  ? ?  ? ? ?Exercise Target Goals: ?Exercise Program Goal: ?Individual exercise prescription set using results from initial 6 min walk test and THRR while considering  patient?s activity barriers and safety.  ? ?Exercise Prescription Goal: ?Initial exercise prescription builds to 30-45 minutes a day of aerobic activity, 2-3 days per week.  Home exercise guidelines will be given to patient during program as part of exercise prescription that the participant will acknowledge. ? ?Activity Barriers & Risk Stratification: ? Activity Barriers  & Cardiac Risk Stratification - 01/03/22 0930   ? ?  ? Activity Barriers & Cardiac Risk Stratification  ? Activity Barriers Arthritis;Back  Problems;Deconditioning;Muscular Weakness;Shortness of Breath   ? ?  ?  ? ?  ? ? ?6 Minute Walk: ? 6 Minute Walk   ? ? [place] Name 01/03/22 1025  ?  ?  ?  ? 6 Minute Walk  ? Phase Initial    ? Distance 1025 feet    ? Walk Time 6 minutes    ? # of Rest Breaks 1  5:20-6:00    ? MPH 1.94    ? METS 2.37    ? RPE 8.3    ? Perceived Dyspnea  2    ? VO2 Peak 8.3    ? Symptoms Yes (comment)    ? Comments 8/10 bilat calf pain    ? Resting HR 72 bpm    ? Resting BP 114/60    ? Resting Oxygen Saturation  98 %    ? Exercise Oxygen Saturation  during 6 min walk 95 %    ? Max Ex. HR 92 bpm    ? Max Ex. BP 122/68    ? 2 Minute Post BP 110/60    ?  ? Interval HR  ? 1 Minute HR 85    ? 2 Minute HR 89    ? 3 Minute HR 92    ? 4 Minute HR 90    ? 5 Minute HR 89    ? 6 Minute HR 82    ? 2 Minute Post HR 78    ? Interval Heart Rate? Yes    ?  ? Interval Oxygen  ? Interval Oxygen? Yes    ? Baseline Oxygen Saturation % 98 %    ? 1 Minute Oxygen Saturation % 95 %    ? 1 Minute Liters of Oxygen 2 L    ? 2 Minute Oxygen Saturation % 95 %    ? 2 Minute Liters of Oxygen 2 L    ? 3 Minute Oxygen Saturation % 96 %    ? 3 Minute Liters of Oxygen 2 L    ? 4 Minute Oxygen Saturation % 96 %    ? 4 Minute Liters of Oxygen 2 L    ? 5 Minute Oxygen Saturation % 94 %    ? 5 Minute Liters of Oxygen 2 L    ? 6 Minute Oxygen Saturation % 97 %    ? 6 Minute Liters of Oxygen 2 L    ? 2 Minute Post Oxygen Saturation % 98 %    ? 2 Minute Post Liters of Oxygen 2 L    ? ?  ?  ? ?  ? ? ?Oxygen Initial Assessment: ? Oxygen Initial Assessment - 01/03/22 0934   ? ?  ? Home Oxygen  ? Home Oxygen Device Portable Concentrator;Home Concentrator;E-Tanks   ? Sleep Oxygen Prescription Continuous   ? Liters per minute 2   ? Home Exercise Oxygen Prescription Pulsed   ? Liters per minute 2   ? Home Resting Oxygen Prescription Continuous   ?  Liters per minute 2   ? Compliance with Home Oxygen Use Yes   ?  ? Initial 6 min Walk  ? Oxygen Used Continuous   ? Liters per minute 2   ?  ? Program Oxygen Prescription  ? Program Oxygen Prescription Cont

## 2022-01-09 ENCOUNTER — Encounter (HOSPITAL_COMMUNITY)
Admission: RE | Admit: 2022-01-09 | Discharge: 2022-01-09 | Disposition: A | Discharge status: Home / Self Care | Payer: Medicare Other | Source: Ambulatory Visit | Attending: Internal Medicine | Admitting: Internal Medicine

## 2022-01-09 DIAGNOSIS — J449 Chronic obstructive pulmonary disease, unspecified: Secondary | ICD-10-CM

## 2022-01-09 NOTE — Progress Notes (Signed)
Daily Session Note ? ?Patient Details  ?Name: [name] ?MRN: [id number] ?Date of Birth: 1956/08/21 ?Referring Provider:   ?Flowsheet Row Pulmonary Rehab Walk Test from 01/03/2022 in [place]  ?Referring Provider Wert  ? ?  ? ? ?Encounter Date: 01/09/2022 ? ?Check In: ? Session Check In - 01/09/22 1436   ? ?  ? Check-In  ? Supervising physician immediately available to respond to emergencies [facility] immediately available   ? Physician(s) Dr. Maryland Pink   ? Location [facility]   ? Staff Present Elmon Else, MS, ACSM-CEP, Exercise Physiologist;Lisa Vicente Serene, MS, ACSM CEP, Exercise Physiologist   ? Virtual Visit No   ? Medication changes reported     No   ? Fall or balance concerns reported    No   ? Tobacco Cessation No Change   ? Warm-up and Cool-down Performed as group-led instruction   ? Resistance Training Performed Yes   ? VAD Patient? No   ? PAD/SET Patient? No   ?  ? Pain Assessment  ? Currently in Pain? No/denies   ? Multiple Pain Sites No   ? ?  ?  ? ?  ? ? ?Capillary Blood Glucose: ?No results found for this or any previous visit (from the past 24 hour(s)). ? ? ? ?Social History  ? ?Tobacco Use  ?Smoking Status Former  ? Packs/day: 1.00  ? Years: 40.00  ? Pack years: 40.00  ? Types: Cigarettes  ? Quit date: 12/14/2017  ? Years since quitting: 4.0  ?Smokeless Tobacco Never  ? ? ?Goals Met:  ?Proper associated with RPD/PD & O2 Sat ?Exercise tolerated well ?No report of concerns or symptoms today ?Strength training completed today ? ?Goals Unmet:  ?Not Applicable ? ?Comments: Service time is from 1323 to 1439.  ? ? ?Dr. Rodman Pickle is Medical Director for Pulmonary Rehab at [facility].  ?

## 2022-01-11 ENCOUNTER — Ambulatory Visit: Payer: Medicare Other | Admitting: Nurse Practitioner

## 2022-01-11 ENCOUNTER — Encounter (HOSPITAL_COMMUNITY)
Admission: RE | Admit: 2022-01-11 | Discharge: 2022-01-11 | Disposition: A | Discharge status: Home / Self Care | Payer: Medicare Other | Source: Ambulatory Visit | Attending: Internal Medicine | Admitting: Internal Medicine

## 2022-01-11 DIAGNOSIS — J449 Chronic obstructive pulmonary disease, unspecified: Secondary | ICD-10-CM

## 2022-01-11 NOTE — Progress Notes (Signed)
Daily Session Note ? ?Patient Details  ?Name: [name] ?MRN: [id number] ?Date of Birth: 1956/09/21 ?Referring Provider:   ?Flowsheet Row Pulmonary Rehab Walk Test from 01/03/2022 in [place]  ?Referring Provider Wert  ? ?  ? ? ?Encounter Date: 01/11/2022 ? ?Check In: ? Session Check In - 01/11/22 1430   ? ?  ? Check-In  ? Supervising physician immediately available to respond to emergencies [facility] immediately available   ? Physician(s) Dr. Maryland Pink   ? Location [facility]   ? Staff Present Rosebud Poles, RN, Milus Glazier, MS, ACSM-CEP, CCRP, Exercise Physiologist;Carlette Wilber Oliphant, RN, Quentin Ore, MS, ACSM-CEP, Exercise Physiologist   ? Virtual Visit No   ? Medication changes reported     No   ? Fall or balance concerns reported    No   ? Tobacco Cessation No Change   ? Warm-up and Cool-down Performed as group-led instruction   ? Resistance Training Performed Yes   ? VAD Patient? No   ? PAD/SET Patient? No   ?  ? Pain Assessment  ? Currently in Pain? No/denies   ? Multiple Pain Sites No   ? ?  ?  ? ?  ? ? ?Capillary Blood Glucose: ?No results found for this or any previous visit (from the past 24 hour(s)). ? ? ? ?Social History  ? ?Tobacco Use  ?Smoking Status Former  ? Packs/day: 1.00  ? Years: 40.00  ? Pack years: 40.00  ? Types: Cigarettes  ? Quit date: 12/14/2017  ? Years since quitting: 4.0  ?Smokeless Tobacco Never  ? ? ?Goals Met:  ?Proper associated with RPD/PD & O2 Sat ?Exercise tolerated well ?No report of concerns or symptoms today ?Strength training completed today ? ?Goals Unmet:  ?Not Applicable ? ?Comments: Service time is from 1330 to 1440 ? ? ? ?Dr. Rodman Pickle is Medical Director for Pulmonary Rehab at [facility].  ?

## 2022-01-16 ENCOUNTER — Encounter (HOSPITAL_COMMUNITY)
Admission: RE | Admit: 2022-01-16 | Discharge: 2022-01-16 | Disposition: A | Discharge status: Home / Self Care | Payer: Medicare Other | Source: Ambulatory Visit | Attending: Internal Medicine | Admitting: Internal Medicine

## 2022-01-16 VITALS — Wt 146.6 lb

## 2022-01-16 DIAGNOSIS — J449 Chronic obstructive pulmonary disease, unspecified: Secondary | ICD-10-CM | POA: Diagnosis not present

## 2022-01-16 NOTE — Progress Notes (Signed)
Daily Session Note ? ?Patient Details  ?Name: [name] ?MRN: [id number] ?Date of Birth: 05/10/1956 ?Referring Provider:   ?Flowsheet Row Pulmonary Rehab Walk Test from 01/03/2022 in [facility]  ?Referring Provider Wert  ? ?  ? ? ?Encounter Date: 01/16/2022 ? ?Check In: ? Session Check In - 01/16/22 1514   ? ?  ? Check-In  ? Supervising physician immediately available to respond to emergencies [facility] immediately available   ? Physician(s) Dr. Dahal   ? Location [facility]   ? Staff Present Joan Behrens, RN, BSN;Kaylee Davis, MS, ACSM-CEP, Exercise Physiologist;Lisa Hughes, RN;Portia Payne, RN, BSN   ? Virtual Visit No   ? Medication changes reported     No   ? Fall or balance concerns reported    No   ? Tobacco Cessation No Change   ? Warm-up and Cool-down Performed as group-led instruction   ? Resistance Training Performed Yes   ? VAD Patient? No   ? PAD/SET Patient? No   ?  ? Pain Assessment  ? Currently in Pain? No/denies   ? Multiple Pain Sites No   ? ?  ?  ? ?  ? ? ?Capillary Blood Glucose: ?No results found for this or any previous visit (from the past 24 hour(s)). ? ? Exercise Prescription Changes - 01/16/22 1500   ? ?  ? Response to Exercise  ? Blood Pressure (Admit) 120/70   ? Blood Pressure (Exercise) 126/62   ? Blood Pressure (Exit) 112/64   ? Heart Rate (Admit) 81 bpm   ? Heart Rate (Exercise) 75 bpm   ? Heart Rate (Exit) 66 bpm   ? Oxygen Saturation (Admit) 96 %   ? Oxygen Saturation (Exercise) 96 %   ? Oxygen Saturation (Exit) 98 %   ? Rating of Perceived Exertion (Exercise) 15   ? Perceived Dyspnea (Exercise) 3   ? Duration Continue with 30 min of aerobic exercise without signs/symptoms of physical distress.   ? Intensity Other (comment)   40-80% of HRR  ?  ? Progression  ? Progression Continue to progress workloads to maintain intensity without signs/symptoms of physical distress.   ?  ? Resistance Training  ? Training Prescription Yes   ?  Weight Red bands   ? Reps 10-15   ? Time 10 Minutes   ?  ? Oxygen  ? Oxygen Continuous   ? Liters 2   ?  ? NuStep  ? Level 1   ? SPM 80   ? Minutes 15   ? METs 1.9   ?  ? Track  ? Laps 6   ? Minutes 15   ? ?  ?  ? ?  ? ? ?Social History  ? ?Tobacco Use  ?Smoking Status Former  ? Packs/day: 1.00  ? Years: 40.00  ? Pack years: 40.00  ? Types: Cigarettes  ? Quit date: 12/14/2017  ? Years since quitting: 4.0  ?Smokeless Tobacco Never  ? ? ?Goals Met:  ?Exercise tolerated well ?No report of concerns or symptoms today ?Strength training completed today ? ?Goals Unmet:  ?Not Applicable ? ?Comments: Service time is from 1314 to 1435 ? ? ? ?Dr. Jane Ellison is Medical Director for Pulmonary Rehab at [facility].  ?

## 2022-01-18 ENCOUNTER — Encounter (HOSPITAL_COMMUNITY): Payer: Medicare Other

## 2022-01-18 ENCOUNTER — Telehealth (HOSPITAL_COMMUNITY): Payer: Self-pay | Admitting: Family Medicine

## 2022-01-23 ENCOUNTER — Encounter (HOSPITAL_COMMUNITY)
Admission: RE | Admit: 2022-01-23 | Discharge: 2022-01-23 | Disposition: A | Discharge status: Home / Self Care | Payer: Medicare Other | Source: Ambulatory Visit | Attending: Internal Medicine | Admitting: Internal Medicine

## 2022-01-23 DIAGNOSIS — J449 Chronic obstructive pulmonary disease, unspecified: Secondary | ICD-10-CM | POA: Diagnosis not present

## 2022-01-23 NOTE — Progress Notes (Signed)
Daily Session Note ? ?Patient Details  ?Name: [name] ?MRN: [id number] ?Date of Birth: 08/14/1956 ?Referring Provider:   ?Flowsheet Row Pulmonary Rehab Walk Test from 01/03/2022 in [facility]  ?Referring Provider Wert  ? ?  ? ? ?Encounter Date: 01/23/2022 ? ?Check In: ? Session Check In - 01/23/22 1426   ? ?  ? Check-In  ? Supervising physician immediately available to respond to emergencies [facility] immediately available   ? Physician(s) Dr. Dahal   ? Location [facility]   ? Staff Present Joan Behrens, RN, BSN;David Makemson, MS, ACSM-CEP, CCRP, Exercise Physiologist;Kaylee Davis, MS, ACSM-CEP, Exercise Physiologist;Lisa Hughes, RN   ? Virtual Visit No   ? Medication changes reported     No   ? Fall or balance concerns reported    No   ? Tobacco Cessation No Change   ? Warm-up and Cool-down Performed as group-led instruction   ? Resistance Training Performed Yes   ? VAD Patient? No   ? PAD/SET Patient? No   ?  ? Pain Assessment  ? Currently in Pain? No/denies   ? Multiple Pain Sites No   ? ?  ?  ? ?  ? ? ?Capillary Blood Glucose: ?No results found for this or any previous visit (from the past 24 hour(s)). ? ? ? ?Social History  ? ?Tobacco Use  ?Smoking Status Former  ? Packs/day: 1.00  ? Years: 40.00  ? Pack years: 40.00  ? Types: Cigarettes  ? Quit date: 12/14/2017  ? Years since quitting: 4.1  ?Smokeless Tobacco Never  ? ? ?Goals Met:  ?Proper associated with RPD/PD & O2 Sat ?Independence with exercise equipment ?Exercise tolerated well ?No report of concerns or symptoms today ?Strength training completed today ? ?Goals Unmet:  ?Not Applicable ? ?Comments: Service time is from 1315 to 1431.  ? ? ?Dr. Jane Ellison is Medical Director for Pulmonary Rehab at [facility].  ?

## 2022-01-24 ENCOUNTER — Ambulatory Visit (INDEPENDENT_AMBULATORY_CARE_PROVIDER_SITE_OTHER): Payer: Medicare Other | Admitting: Nurse Practitioner

## 2022-01-24 ENCOUNTER — Encounter: Payer: Self-pay | Admitting: Nurse Practitioner

## 2022-01-24 ENCOUNTER — Ambulatory Visit (INDEPENDENT_AMBULATORY_CARE_PROVIDER_SITE_OTHER): Payer: Medicare Other

## 2022-01-24 VITALS — BP 114/70 | HR 65 | Temp 97.9°F | Ht 65.0 in | Wt 146.0 lb

## 2022-01-24 DIAGNOSIS — J9611 Chronic respiratory failure with hypoxia: Secondary | ICD-10-CM | POA: Diagnosis not present

## 2022-01-24 DIAGNOSIS — F4321 Adjustment disorder with depressed mood: Secondary | ICD-10-CM | POA: Diagnosis not present

## 2022-01-24 DIAGNOSIS — J3089 Other allergic rhinitis: Secondary | ICD-10-CM | POA: Diagnosis not present

## 2022-01-24 DIAGNOSIS — J45901 Unspecified asthma with (acute) exacerbation: Secondary | ICD-10-CM

## 2022-01-24 DIAGNOSIS — J441 Chronic obstructive pulmonary disease with (acute) exacerbation: Secondary | ICD-10-CM

## 2022-01-24 DIAGNOSIS — F4320 Adjustment disorder, unspecified: Secondary | ICD-10-CM

## 2022-01-24 HISTORY — DX: Adjustment disorder, unspecified: F43.20

## 2022-01-24 MED ORDER — PREDNISONE 10 MG PO TABS: ORAL_TABLET | ORAL | 0 refills | Status: DC

## 2022-01-24 MED ORDER — PREDNISONE 10 MG PO TABS: 10.0000 mg | ORAL_TABLET | Freq: Every day | ORAL | 5 refills | Status: DC

## 2022-01-24 MED ORDER — AZITHROMYCIN 250 MG PO TABS: ORAL_TABLET | ORAL | 0 refills | Status: DC

## 2022-01-24 NOTE — Progress Notes (Signed)
Please notify patient no evidence of pneumonia or acute process. Pleural effusion is changed. Changes consistent with COPD and unchanged. Continue our plan as discussed earlier.

## 2022-01-24 NOTE — Progress Notes (Signed)
Pulmonary Individual Treatment Plan ? ?Patient Details  ?Name: [name] ?MRN: [id number] ?Date of Birth: 1956/06/04 ?Referring Provider:   ?Flowsheet Row Pulmonary Rehab Walk Test from 01/03/2022 in [place]  ?Referring Provider Wert  ? ?  ? ? ?Initial Encounter Date:  ?Flowsheet Row Pulmonary Rehab Walk Test from 01/03/2022 in [place]  ?Date 01/03/22  ? ?  ? ? ?Visit Diagnosis: Stage 3 severe COPD by GOLD classification ([place]) ? ?Patient's Home Medications on Admission:  ? ?Current Outpatient Medications:  ?  acetaminophen (TYLENOL) 500 MG tablet, Take 1,000 mg by mouth every 6 (six) hours as needed for mild pain., Disp: , Rfl:  ?  albuterol (VENTOLIN HFA) 108 (90 Base) MCG/ACT inhaler, Inhale 2 puffs into the lungs every 6 (six) hours as needed for wheezing or shortness of breath., Disp: 8 g, Rfl: 0 ?  azelastine (ASTELIN) 0.1 % nasal spray, Place 2 sprays into both nostrils 2 (two) times daily. Use in each nostril as directed, Disp: 30 mL, Rfl: 6 ?  fluticasone (FLONASE) 50 MCG/ACT nasal spray, Place 1 spray into both nostrils daily., Disp: 18.2 mL, Rfl: 2 ?  guaiFENesin (MUCINEX) 600 MG 12 hr tablet, Take 600 mg by mouth 2 (two) times daily as needed for cough or to loosen phlegm., Disp: , Rfl:  ?  ipratropium (ATROVENT) 0.02 % nebulizer solution, Take 0.5 mg by nebulization as needed for wheezing or shortness of breath., Disp: , Rfl:  ?  loratadine (CLARITIN) 10 MG tablet, Take 1 tablet (10 mg total) by mouth daily., Disp: 30 tablet, Rfl: 3 ?  Melatonin 10 MG TABS, Take 40 mg by mouth at bedtime., Disp: , Rfl:  ?  omeprazole (PRILOSEC) 20 MG capsule, Take 20 mg by mouth daily., Disp: , Rfl:  ?  PARoxetine (PAXIL) 40 MG tablet, Take 1 tablet (40 mg total) by mouth daily., Disp: 90 tablet, Rfl: 1 ?  propranolol ER (INDERAL LA) 60 MG 24 hr capsule, Take 1 capsule (60 mg total) by mouth daily., Disp: 90 capsule, Rfl: 1 ?  traZODone (DESYREL) 100  MG tablet, Take 1 tablet (100 mg total) by mouth at bedtime as needed for sleep., Disp: 90 tablet, Rfl: 1 ? ?Past Medical History: ?Past Medical History:  ?Diagnosis Date  ? Anxiety   ? 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 Bronchitis   ? Depression   ? Dyspnea   ? Emphysema, unspecified ([place])   ? per patient mild case  ? Family history of adverse reaction to anesthesia   ? patient states sister has a hard time waking up from anesthesia.   ? Hepatitis C   ? lung ca dx'd 03/2018  ? Tremor, unspecified   ? non-specific; takes inderal.   ? ? ?Tobacco Use: ?Social History  ? ?Tobacco Use  ?Smoking Status Former  ? Packs/day: 1.00  ? Years: 40.00  ? Pack years: 40.00  ? Types: Cigarettes  ? Quit date: 12/14/2017  ? Years since quitting: 4.1  ?Smokeless Tobacco Never  ? ? ?Labs: ?Review Flowsheet   ? ?  ?  Latest Ref Rng & Units 08/11/2018 09/04/2019 12/18/2019 12/19/2019  ?Labs for ITP Cardiac and Pulmonary Rehab  ?Cholestrol <200 mg/dL 245   242      ?LDL (calc) mg/dL (calc)  149      ?Direct LDL mg/dL 173.0       ?HDL-C > OR = 50 mg/dL 53.40   50      ?  Trlycerides <150 mg/dL 342.0   260      ?Hemoglobin A1c <5.7 % of total Hgb  5.3      ?PH, Arterial 7.350 - 7.450   7.362   7.389    ?PCO2 arterial 32.0 - 48.0 mmHg   45.4   40.7    ?Bicarbonate 20.0 - 28.0 mmol/L   25.1   24.0    ?Acid-base deficit 0.0 - 2.0 mmol/L    0.3    ?O2 Saturation %   99.0   95.6    ? ?  09/18/2021  ?Labs for ITP Cardiac and Pulmonary Rehab  ?Cholestrol   ?LDL (calc)   ?Direct LDL   ?HDL-C   ?Trlycerides   ?Hemoglobin A1c   ?PH, Arterial   ?PCO2 arterial   ?Bicarbonate 23.1    ?Acid-base deficit 0.3    ?O2 Saturation 87.4    ?  ? ? Multiple values from one day are sorted in reverse-chronological order  ?  ?  ? ? ?Capillary Blood Glucose: ?Lab Results  ?Component Value Date  ? GLUCAP 105 (H) 12/03/2019  ? GLUCAP 109 (H) 02/19/2018  ? ? ? ?Pulmonary Assessment Scores: ? Pulmonary Assessment Scores   ? ? [place] Name 01/03/22 9323  ?  ?  ?  ? ADL UCSD  ? ADL  Phase Entry    ? SOB Score total 35    ?  ? CAT Score  ? CAT Score 22    ? ?  ?  ? ?  ? ?UCSD: ?Self-administered rating of dyspnea associated with activities of daily living (ADLs) ?6-point scale (0 = "not at all" to 5 = "maximal or unable to do because of breathlessness")  ?Scoring Scores range from 0 to 120.  Minimally important difference is 5 units ? ?CAT: ?CAT can identify the health impairment of COPD patients and is better correlated with disease progression.  ?CAT has a scoring range of zero to 40. The CAT score is classified into four groups of low (less than 10), medium (10 - 20), high (21-30) and very high (31-40) based on the impact level of disease on health status. A CAT score over 10 suggests significant symptoms.  A worsening CAT score could be explained by an exacerbation, poor medication adherence, poor inhaler technique, or progression of COPD or comorbid conditions.  ?CAT MCID is 2 points ? ?mMRC: ?mMRC (Modified Medical Research Council) Dyspnea Scale is used to assess the degree of baseline functional disability in patients of respiratory disease due to dyspnea. ?No minimal important difference is established. A decrease in score of 1 point or greater is considered a positive change.  ? ?Pulmonary Function Assessment: ? Pulmonary Function Assessment - 01/03/22 0936   ? ?  ? Breath  ? Shortness of Breath Fear of Shortness of Breath;Limiting activity;Yes   ? ?  ?  ? ?  ? ? ?Exercise Target Goals: ?Exercise Program Goal: ?Individual exercise prescription set using results from initial 6 min walk test and THRR while considering  patient?s activity barriers and safety.  ? ?Exercise Prescription Goal: ?Initial exercise prescription builds to 30-45 minutes a day of aerobic activity, 2-3 days per week.  Home exercise guidelines will be given to patient during program as part of exercise prescription that the participant will acknowledge. ? ?Activity Barriers & Risk Stratification: ? Activity Barriers  & Cardiac Risk Stratification - 01/03/22 0930   ? ?  ? Activity Barriers & Cardiac Risk Stratification  ? Activity Barriers Arthritis;Back  Problems;Deconditioning;Muscular Weakness;Shortness of Breath   ? ?  ?  ? ?  ? ? ?6 Minute Walk: ? 6 Minute Walk   ? ? [place] Name 01/03/22 1025  ?  ?  ?  ? 6 Minute Walk  ? Phase Initial    ? Distance 1025 feet    ? Walk Time 6 minutes    ? # of Rest Breaks 1  5:20-6:00    ? MPH 1.94    ? METS 2.37    ? RPE 8.3    ? Perceived Dyspnea  2    ? VO2 Peak 8.3    ? Symptoms Yes (comment)    ? Comments 8/10 bilat calf pain    ? Resting HR 72 bpm    ? Resting BP 114/60    ? Resting Oxygen Saturation  98 %    ? Exercise Oxygen Saturation  during 6 min walk 95 %    ? Max Ex. HR 92 bpm    ? Max Ex. BP 122/68    ? 2 Minute Post BP 110/60    ?  ? Interval HR  ? 1 Minute HR 85    ? 2 Minute HR 89    ? 3 Minute HR 92    ? 4 Minute HR 90    ? 5 Minute HR 89    ? 6 Minute HR 82    ? 2 Minute Post HR 78    ? Interval Heart Rate? Yes    ?  ? Interval Oxygen  ? Interval Oxygen? Yes    ? Baseline Oxygen Saturation % 98 %    ? 1 Minute Oxygen Saturation % 95 %    ? 1 Minute Liters of Oxygen 2 L    ? 2 Minute Oxygen Saturation % 95 %    ? 2 Minute Liters of Oxygen 2 L    ? 3 Minute Oxygen Saturation % 96 %    ? 3 Minute Liters of Oxygen 2 L    ? 4 Minute Oxygen Saturation % 96 %    ? 4 Minute Liters of Oxygen 2 L    ? 5 Minute Oxygen Saturation % 94 %    ? 5 Minute Liters of Oxygen 2 L    ? 6 Minute Oxygen Saturation % 97 %    ? 6 Minute Liters of Oxygen 2 L    ? 2 Minute Post Oxygen Saturation % 98 %    ? 2 Minute Post Liters of Oxygen 2 L    ? ?  ?  ? ?  ? ? ?Oxygen Initial Assessment: ? Oxygen Initial Assessment - 01/03/22 0934   ? ?  ? Home Oxygen  ? Home Oxygen Device Portable Concentrator;Home Concentrator;E-Tanks   ? Sleep Oxygen Prescription Continuous   ? Liters per minute 2   ? Home Exercise Oxygen Prescription Pulsed   ? Liters per minute 2   ? Home Resting Oxygen Prescription Continuous   ?  Liters per minute 2   ? Compliance with Home Oxygen Use Yes   ?  ? Initial 6 min Walk  ? Oxygen Used Continuous   ? Liters per minute 2   ?  ? Program Oxygen Prescription  ? Program Oxygen Prescription Cont

## 2022-01-24 NOTE — Assessment & Plan Note (Signed)
Recent loss of husband.  Appears to be appropriately coping; has a good support system through her sister.  Provided with information for grief counseling services through [facility].  Advised her to notify if her mood worsens or she begins to feel depressed. ?

## 2022-01-24 NOTE — Assessment & Plan Note (Signed)
Significant improvement in symptoms.  Continue trigger prevention with Claritin and Singulair.  Continue postnasal drainage control with Flonase and Astelin as needed. ?

## 2022-01-24 NOTE — Progress Notes (Signed)
? ?@Patient  ID: [name], female    DOB: 1955-10-28, 66 y.o.   MRN: [id number] ? ?Chief Complaint  ?Patient presents with  ? Follow-up  ?  She has noticed if she comes off the prednisone and has a mostly non productive cough but sometimes clear sputum,   ? ? ?Referring provider: ?[name], MD ? ?HPI: ?66 year old female, former smoker (40 pack years) followed for COPD Gold 3. She is a patient of Dr. Gustavus Bryant and was last seen in office on 10/27/2021 by [facility] NP. She is also followed by Dr. 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 Past medical history significant for migraiens, GERD, chronic hepatitis C, cirrhosis, depression, tremor, tobacco abuse.  ? ?TEST/EVENTS:  ?12/14/2019 PFTs: FVC 2.63 (79), FEV1 1.02 (40), ratio 39, TLC 111%, DLCOcor 44%. +BD ?09/18/2021 CTA chest: No PE. Centrilobular and paraseptal emphysematous lung disease.  ?10/27/2021 CXR 2 view: lungs are clear. Trace right pleural effusion was stable when compared to prior exam ?10/27/2021: RAST positive for dust mites and low positive for dog dander; Eos 238 ? ?09/17/2021-09/20/2021: Hospitalization for COPD exacerbation and acute hypoxic respiratory failure. Tx with abx and steroids. CTA neg for PE. CT chest with RUL airspace infiltrate. COVID and flu neg. D/c on home O2. ?  ?09/23/2021: ED visit for SOB and chest tightness. COVID positive. D dimer neg after age adjustment - shared decision to not repeat CTA. Tx with molnupiravir and prednisone burst.  ?  ?10/06/2021: OV with Blakelyn Dinges NP for hospital f/u. Improved post discharge and having COVID. Stepped up therapy to Trelegy daily given recent hospitalization and O2 requirement. Added flonase and saline nasal spray for rhinorrhea. Continued 2 lpm supplemental O2 for goal >88-90%. CXR with new small right pleural effusion - given clinical improvement, no tx and continue to monitor ? ?10/27/2021: OV with Elazar Argabright NP. Worsening SOB. Stopped Trelegy due to  worsening dry cough and started back on Symbicort. Her breathing had felt a little better on Trelegy. She reported nasal congestion and drainage without significant improvement with flonase. Oxygen was stable on 2 lpm. CBC with diff showed elevated eosinophils and RAST was positive for dust mites and dog dander. She was started on Singulair and claritin. Switched to [employer] as DPI likely exacerbated cough. Prednisone taper for AECOPD/asthma. Added on astelin for rhinitis. CXR was clear with trace, stable right pleural effusion  ? ?12/21/2021: OV with Selah Klang NP.  Patient presents today for worsening shortness of breath over the past week or two. Since being seen last, she ran out of her Judithann Sauger and was just able to get restarted on it 3-4 days ago. She has a minimal, dry cough that she feels like is unchanged from her baseline. Her sinus symptoms have improved since we saw her last. She also feels like the singulair has helped a lot. Some occasional wheezing.  Treated with prednisone burst.  CXR without acute process.  Did not have any increased O2 requirements; continue 2 L/min supplemental O2. ? ?01/24/2022: Today-acute visit ?Patient presents today for worsening shortness of breath.  Feels like her symptoms started around 2 to 3 weeks ago.  She feels like her activity tolerance has declined and she is not able to walk very much when she attends pulmonary rehab.  She also has a productive cough with white sputum production.  Has not noticed any excessive wheezing or URI symptoms.  Denies any hemoptysis, recent weight loss, lower extremity edema or increasing O2  requirements.  She continues on 2 L/min supplemental O2 and maintains oxygen saturations in the 90s.  Her husband did recently passed away towards the end of March and she has been grieving from this.  Feels as though it has added some stress and possibly negatively contributed to her breathing as well, understandably so.  She continues on [place] twice daily,  Singulair, Flonase, and Claritin.  Using albuterol more than she previously was. ? ?Allergies  ?Allergen Reactions  ? Aspirin Shortness Of Breath  ? Trelegy Ellipta [Fluticasone-Umeclidin-Vilant] Shortness Of Breath  ?  She reports that is made her cough worse.   ? Morphine Sulfate Other (See Comments)  ?  RED STREAKS ON ARMS.  ? Oxycodone Itching and Rash  ? Glycopyrrolate Other (See Comments)  ?  UNSPECIFIED REACTION  ?pt unsure if she is allergic.  ? Lactose Intolerance (Gi) Nausea And Vomiting  ?  Milk only.Patient states that she can eat cheese.  ? Penicillins Other (See Comments)  ?   Heartburn, upset stomach only & tolerated AMOXIL after this reaction ?Has patient had a PCN reaction causing immediate rash, facial/tongue/throat swelling, SOB or lightheadedness with hypotension: No ?Has patient had a PCN reaction causing severe rash involving mucus membranes or skin necrosis: No ?Has patient had a PCN reaction that required hospitalization: No ?Has patient had a PCN reaction occurring within the last 10 years: #  #  #  YES  #  #  #   ? Sulfa Antibiotics Tinitus  ? ? ?Immunization History  ?Administered Date(s) Administered  ? Fluad Quad(high Dose 65+) 06/29/2021  ? Influenza Split 07/29/2012  ? Influenza,inj,Quad PF,6+ Mos 06/27/2019, 07/14/2020  ? Influenza-Unspecified 07/01/2017, 07/09/2017, 07/09/2018  ? Moderna Sars-Covid-2 Vaccination 01/18/2020, 02/17/2020  ? Pneumococcal Conjugate-13 09/04/2019  ? Pneumococcal Polysaccharide-23 11/13/2017  ? Tdap 01/21/2014  ? ? ?Past Medical History:  ?Diagnosis Date  ? Anxiety   ? 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 Bronchitis   ? Depression   ? Dyspnea   ? Emphysema, unspecified ([place])   ? per patient mild case  ? Family history of adverse reaction to anesthesia   ? patient states sister has a hard time waking up from anesthesia.   ? Hepatitis C   ? lung ca dx'd 03/2018  ? Tremor, unspecified   ? non-specific; takes inderal.   ? ? ?Tobacco History: ?Social History   ? ?Tobacco Use  ?Smoking Status Former  ? Packs/day: 1.00  ? Years: 40.00  ? Pack years: 40.00  ? Types: Cigarettes  ? Quit date: 12/14/2017  ? Years since quitting: 4.1  ?Smokeless Tobacco Never  ? ?Counseling given: Not Answered ? ? ?Outpatient Medications Prior to Visit  ?Medication Sig Dispense Refill  ? acetaminophen (TYLENOL) 500 MG tablet Take 1,000 mg by mouth every 6 (six) hours as needed for mild pain.    ? albuterol (VENTOLIN HFA) 108 (90 Base) MCG/ACT inhaler Inhale 2 puffs into the lungs every 6 (six) hours as needed for wheezing or shortness of breath. 8 g 0  ? azelastine (ASTELIN) 0.1 % nasal spray Place 2 sprays into both nostrils 2 (two) times daily. Use in each nostril as directed 30 mL 6  ? fluticasone (FLONASE) 50 MCG/ACT nasal spray Place 1 spray into both nostrils daily. 18.2 mL 2  ? guaiFENesin (MUCINEX) 600 MG 12 hr tablet Take 600 mg by mouth 2 (two) times daily as needed for cough or to loosen phlegm.    ? ipratropium (ATROVENT) 0.02 %  nebulizer solution Take 0.5 mg by nebulization as needed for wheezing or shortness of breath.    ? loratadine (CLARITIN) 10 MG tablet Take 1 tablet (10 mg total) by mouth daily. 30 tablet 3  ? Melatonin 10 MG TABS Take 40 mg by mouth at bedtime.    ? montelukast (SINGULAIR) 10 MG tablet Take by mouth.    ? omeprazole (PRILOSEC) 20 MG capsule Take 20 mg by mouth daily.    ? PARoxetine (PAXIL) 40 MG tablet Take 1 tablet (40 mg total) by mouth daily. 90 tablet 1  ? propranolol ER (INDERAL LA) 60 MG 24 hr capsule Take 1 capsule (60 mg total) by mouth daily. 90 capsule 1  ? traZODone (DESYREL) 100 MG tablet Take 1 tablet (100 mg total) by mouth at bedtime as needed for sleep. 90 tablet 1  ? ?No facility-administered medications prior to visit.  ? ? ? ?Review of Systems:  ? ?Constitutional: No weight loss or gain, night sweats, fevers, chills, fatigue, or lassitude. ?HEENT: No headaches, difficulty swallowing, tooth/dental problems, or sore throat. No sneezing,  itching, ear ache, nasal congestion, or post nasal drip ?CV:  No chest pain, orthopnea, PND, swelling in lower extremities, anasarca, dizziness, palpitations, syncope ?Resp: +shortness of breath with exertion (increa

## 2022-01-24 NOTE — Progress Notes (Deleted)
@Patient  ID: [name], female    DOB: 05-May-1956, 66 y.o.   MRN: [id number]  No chief complaint on file.   Referring provider: Wynn Banker, MD  HPI:   TEST/EVENTS:   Allergies  Allergen Reactions   Aspirin Shortness Of Breath   Trelegy Ellipta [Fluticasone-Umeclidin-Vilant] Shortness Of Breath    She reports that is made her cough worse.    Morphine Sulfate Other (See Comments)    RED STREAKS ON ARMS.   Oxycodone Itching and Rash   Glycopyrrolate Other (See Comments)    UNSPECIFIED REACTION  pt unsure if she is allergic.   Lactose Intolerance (Gi) Nausea And Vomiting    Milk only.Patient states that she can eat cheese.   Penicillins Other (See Comments)     Heartburn, upset stomach only & tolerated AMOXIL after this reaction Has patient had a PCN reaction causing immediate rash, facial/tongue/throat swelling, SOB or lightheadedness with hypotension: No Has patient had a PCN reaction causing severe rash involving mucus membranes or skin necrosis: No Has patient had a PCN reaction that required hospitalization: No Has patient had a PCN reaction occurring within the last 10 years: #  #  #  YES  #  #  #    Sulfa Antibiotics Tinitus    Immunization History  Administered Date(s) Administered   Fluad Quad(high Dose 65+) 06/29/2021   Influenza Split 07/29/2012   Influenza,inj,Quad PF,6+ Mos 06/27/2019, 07/14/2020   Influenza-Unspecified 07/01/2017, 07/09/2017, 07/09/2018   Moderna Sars-Covid-2 Vaccination 01/18/2020, 02/17/2020   Pneumococcal Conjugate-13 09/04/2019   Pneumococcal Polysaccharide-23 11/13/2017   Tdap 01/21/2014    Past Medical History:  Diagnosis Date   Anxiety    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    Hepatitis C    lung ca dx'd  03/2018   Tremor, unspecified    non-specific; takes inderal.     Tobacco History: Social History   Tobacco Use  Smoking Status Former   Packs/day: 1.00   Years: 40.00   Pack years: 40.00   Types: Cigarettes   Quit date: 12/14/2017   Years since quitting: 4.1  Smokeless Tobacco Never   Counseling given: Not Answered   Outpatient Medications Prior to Visit  Medication Sig Dispense Refill   acetaminophen (TYLENOL) 500 MG tablet Take 1,000 mg by mouth every 6 (six) hours as needed for mild pain.     albuterol (VENTOLIN HFA) 108 (90 Base) MCG/ACT inhaler Inhale 2 puffs into the lungs every 6 (six) hours as needed for wheezing or shortness of breath. 8 g 0   azelastine (ASTELIN) 0.1 % nasal spray Place 2 sprays into both nostrils 2 (two) times daily. Use in each nostril as directed 30 mL 6   fluticasone (FLONASE) 50 MCG/ACT nasal spray Place 1 spray into both nostrils daily. 18.2 mL 2   guaiFENesin (MUCINEX) 600 MG 12 hr tablet Take 600 mg by mouth 2 (two) times daily as needed for cough or to loosen phlegm.     ipratropium (ATROVENT) 0.02 % nebulizer solution Take 0.5 mg by nebulization as needed for wheezing or shortness of breath.     loratadine (CLARITIN) 10 MG tablet Take 1 tablet (10 mg total) by mouth daily. 30 tablet 3  Melatonin 10 MG TABS Take 40 mg by mouth at bedtime.     omeprazole (PRILOSEC) 20 MG capsule Take 20 mg by mouth daily.     PARoxetine (PAXIL) 40 MG tablet Take 1 tablet (40 mg total) by mouth daily. 90 tablet 1   propranolol ER (INDERAL LA) 60 MG 24 hr capsule Take 1 capsule (60 mg total) by mouth daily. 90 capsule 1   traZODone (DESYREL) 100 MG tablet Take 1 tablet (100 mg total) by mouth at bedtime as needed for sleep. 90 tablet 1   No facility-administered medications prior to visit.     Review of Systems:   Constitutional: No weight loss or gain, night sweats, fevers, chills, fatigue, or lassitude. HEENT: No headaches, difficulty swallowing,  tooth/dental problems, or sore throat. No sneezing, itching, ear ache, nasal congestion, or post nasal drip CV:  No chest pain, orthopnea, PND, swelling in lower extremities, anasarca, dizziness, palpitations, syncope Resp: No shortness of breath with exertion or at rest. No excess mucus or change in color of mucus. No productive or non-productive. No hemoptysis. No wheezing.  No chest wall deformity GI:  No heartburn, indigestion, abdominal pain, nausea, vomiting, diarrhea, change in bowel habits, loss of appetite, bloody stools.  GU: No dysuria, change in color of urine, urgency or frequency.  No flank pain, no hematuria  Skin: No rash, lesions, ulcerations MSK:  No joint pain or swelling.  No decreased range of motion.  No back pain. Neuro: No dizziness or lightheadedness.  Psych: No depression or anxiety. Mood stable.     Physical Exam:  There were no vitals taken for this visit.  GEN: Pleasant, interactive, well-nourished/chronically-ill appearing/acutely-ill appearing/poorly-nourished/morbidly obese; in no acute distress.****** HEENT:  Normocephalic and atraumatic. EACs patent bilaterally. TM pearly gray with present light reflex bilaterally. PERRLA. Sclera white. Nasal turbinates pink, moist and patent bilaterally. No rhinorrhea present. Oropharynx pink and moist, without exudate or edema. No lesions, ulcerations, or postnasal drip.  NECK:  Supple w/ fair ROM. No JVD present. Normal carotid impulses w/o bruits. Thyroid symmetrical with no goiter or nodules palpated. No lymphadenopathy.   CV: RRR, no m/r/g, no peripheral edema. Pulses intact, +2 bilaterally. No cyanosis, pallor or clubbing. PULMONARY:  Unlabored, regular breathing. Clear bilaterally A&P w/o wheezes/rales/rhonchi. No accessory muscle use. No dullness to percussion. GI: BS present and normoactive. Soft, non-tender to palpation. No organomegaly or masses detected. No CVA tenderness. MSK: No erythema, warmth or tenderness.  Cap refil <2 sec all extrem. No deformities or joint swelling noted.  Neuro: A/Ox3. No focal deficits noted.   Skin: Warm, no lesions or rashe Psych: Normal affect and behavior. Judgement and thought content appropriate.     Lab Results:  CBC    Component Value Date/Time   WBC 6.8 10/27/2021 1527   RBC 4.63 10/27/2021 1527   HGB 13.3 10/27/2021 1527   HGB 14.6 04/10/2021 0928   HCT 39.9 10/27/2021 1527   PLT 334 10/27/2021 1527   PLT 292 04/10/2021 0928   MCV 86.2 10/27/2021 1527   MCH 28.7 10/27/2021 1527   MCHC 33.3 10/27/2021 1527   RDW 13.0 10/27/2021 1527   LYMPHSABS 1,367 10/27/2021 1527   MONOABS 0.8 09/23/2021 1559   EOSABS 238 10/27/2021 1527   BASOSABS 61 10/27/2021 1527    BMET    Component Value Date/Time   NA 140 10/27/2021 1543   K 4.6 10/27/2021 1543   CL 100 10/27/2021 1543   CO2 31 10/27/2021 1543   GLUCOSE 107 (  H) 10/27/2021 1543   BUN 11 10/27/2021 1543   CREATININE 0.73 10/27/2021 1543   CALCIUM 9.8 10/27/2021 1543   GFRNONAA >60 09/23/2021 1559   GFRNONAA >60 04/10/2021 0928   GFRNONAA 66 07/14/2020 1059   GFRAA 76 07/14/2020 1059    BNP    Component Value Date/Time   BNP 78 10/27/2021 1543     Imaging:  No results found.       Latest Ref Rng & Units 12/14/2019    9:20 AM 03/11/2018    7:47 AM  PFT Results  FVC-Pre L 2.01   1.86    FVC-Predicted Pre % 60   55    FVC-Post L 2.63   2.47    FVC-Predicted Post % 79   73    Pre FEV1/FVC % % 42   52    Post FEV1/FCV % % 39   52    FEV1-Pre L 0.85   0.97    FEV1-Predicted Pre % 33   37    FEV1-Post L 1.02   1.28    DLCO uncorrected ml/min/mmHg 10.06   10.99    DLCO UNC% % 48   42    DLCO corrected ml/min/mmHg 9.24   10.54    DLCO COR %Predicted % 44   41    DLVA Predicted % 55   54    TLC L 5.78   5.37    TLC % Predicted % 111   103    RV % Predicted % 168   144      Lab Results  Component Value Date   NITRICOXIDE 29 10/27/2021        Assessment & Plan:   No  problem-specific Assessment & Plan notes found for this encounter.   I spent *** minutes of dedicated to the care of this patient on the date of this encounter to include pre-visit review of records, face-to-face time with the patient discussing conditions above, post visit ordering of testing, clinical documentation with the electronic health record, making appropriate referrals as documented, and communicating necessary findings to members of the patients care team.  Noemi Chapel, NP 01/24/2022  Pt aware and understands NP's role.

## 2022-01-24 NOTE — Progress Notes (Signed)
I called the pharmacy and let them know to fill the taper prednisone and not the daily per [facility]. [facility] did not have any questions. They will get it fixed.  ? ?

## 2022-01-24 NOTE — Assessment & Plan Note (Addendum)
AECOPD/asthma.  She has had a few exacerbations requiring prednisone since being discharged from the hospital.  First flare was after she stop Trelegy due to worsening in her cough.  Second flare was after she ran out of [place].  We will treat with prednisone taper and Z-Pak today.  Advised that if she continues to have exacerbations, we will likely need to initiate therapy with daily prednisone.  Verbalized understanding.  Continue pulmonary rehab.  CXR to rule out superimposed infection. ? ?Patient Instructions  ?-Continue Breztri 2 puffs Twice daily. Brush tongue and rinse mouth afterwards ?-Continue Albuterol inhaler 2 puffs every 6 hours as needed for shortness of breath or wheezing. Notify if symptoms persist despite rescue inhaler/neb use. ?-Continue flonase 2 sprays each nostril daily ?-Continue mucinex 600 mg Twice daily  ?-Continue omeprazole 20 mg daily ?-Continue supplemental oxygen at 2 lpm for goal oxygen saturation >88-90%. Notify of increasing requirements ?-Continue Astelin nasal spray 2 sprays each nostril Twice daily  ?-Continue Claritin (loratidine) 10 mg daily for allergies ?-Continue singulair 10 mg At bedtime  ? ?-Prednisone taper. 4 tabs for 3 days, then 3 tabs for 3 days, 2 tabs for 3 days, then transition to 1 tab (10 mg) daily. Take in AM with food ?-Z pack. Take 2 tabs on day one then take 1 tab daily for 4 days.  Take with food ?  ?Chest x ray today. We will notify you of any abnormal results.  ?  ?Follow up in 2 weeks with Katie Selestino Nila,NP. Follow up with Dr. Melvyn Novas in 6 weeks, bring all of your medications with you. If symptoms do not improve or worsen, please contact office for sooner follow up or seek emergency care. ? ?

## 2022-01-24 NOTE — Patient Instructions (Addendum)
-  Continue Breztri 2 puffs Twice daily. Brush tongue and rinse mouth afterwards ?-Continue Albuterol inhaler 2 puffs every 6 hours as needed for shortness of breath or wheezing. Notify if symptoms persist despite rescue inhaler/neb use. ?-Continue flonase 2 sprays each nostril daily ?-Continue mucinex 600 mg Twice daily  ?-Continue omeprazole 20 mg daily ?-Continue supplemental oxygen at 2 lpm for goal oxygen saturation >88-90%. Notify of increasing requirements ?-Continue Astelin nasal spray 2 sprays each nostril Twice daily  ?-Continue Claritin (loratidine) 10 mg daily for allergies ?-Continue singulair 10 mg At bedtime  ? ?-Prednisone taper. 4 tabs for 3 days, then 3 tabs for 3 days, 2 tabs for 3 days, then transition to 1 tab (10 mg) daily. Take in AM with food ?-Z pack. Take 2 tabs on day one then take 1 tab daily for 4 days.  Take with food ?  ?Chest x ray today. We will notify you of any abnormal results.  ?  ?Follow up in 2 weeks with Katie Josiah Wojtaszek,NP. Follow up with Dr. Melvyn Novas in 6 weeks, bring all of your medications with you. If symptoms do not improve or worsen, please contact office for sooner follow up or seek emergency care. ?

## 2022-01-24 NOTE — Assessment & Plan Note (Signed)
No increased O2 demand.  Continues on 2 L/min supplemental O2.  Goal saturation > 88 to 90% ?

## 2022-01-25 ENCOUNTER — Encounter (HOSPITAL_COMMUNITY)
Admission: RE | Admit: 2022-01-25 | Discharge: 2022-01-25 | Disposition: A | Discharge status: Home / Self Care | Payer: Medicare Other | Source: Ambulatory Visit | Attending: Internal Medicine | Admitting: Internal Medicine

## 2022-01-25 DIAGNOSIS — J449 Chronic obstructive pulmonary disease, unspecified: Secondary | ICD-10-CM

## 2022-01-25 NOTE — Progress Notes (Signed)
Daily Session Note ? ?Patient Details  ?Name: [name] ?MRN: [id number] ?Date of Birth: Feb 12, 1956 ?Referring Provider:   ?Flowsheet Row Pulmonary Rehab Walk Test from 01/03/2022 in [place]  ?Referring Provider Wert  ? ?  ? ? ?Encounter Date: 01/25/2022 ? ?Check In: ? Session Check In - 01/25/22 1428   ? ?  ? Check-In  ? Supervising physician immediately available to respond to emergencies [facility] immediately available   ? Physician(s) Dr. Bonner Puna   ? Location [facility]   ? Staff Present Elmon Else, MS, ACSM-CEP, Exercise Physiologist;David Makemson, MS, ACSM-CEP, CCRP, Exercise Physiologist;Daiden Coltrane Ysidro Evert, RN   ? Virtual Visit No   ? Medication changes reported     No   ? Fall or balance concerns reported    No   ? Tobacco Cessation No Change   ? Warm-up and Cool-down Performed as group-led instruction   ? Resistance Training Performed Yes   ? VAD Patient? No   ? PAD/SET Patient? No   ?  ? Pain Assessment  ? Currently in Pain? No/denies   ? Multiple Pain Sites No   ? ?  ?  ? ?  ? ? ?Capillary Blood Glucose: ?No results found for this or any previous visit (from the past 24 hour(s)). ? ? ? ?Social History  ? ?Tobacco Use  ?Smoking Status Former  ? Packs/day: 1.00  ? Years: 40.00  ? Pack years: 40.00  ? Types: Cigarettes  ? Quit date: 12/14/2017  ? Years since quitting: 4.1  ?Smokeless Tobacco Never  ? ? ?Goals Met:  ?Proper associated with RPD/PD & O2 Sat ?Exercise tolerated well ?No report of concerns or symptoms today ?Strength training completed today ? ?Goals Unmet:  ?Not Applicable ? ?Comments: Service time is from 1315 to 1445 ? ? ? ?Dr. Rodman Pickle is Medical Director for Pulmonary Rehab at [facility].  ?

## 2022-01-26 NOTE — Progress Notes (Signed)
Spoke with pt and voices understanding. Pt did not have any further questions.

## 2022-01-30 ENCOUNTER — Encounter (HOSPITAL_COMMUNITY)
Admission: RE | Admit: 2022-01-30 | Discharge: 2022-01-30 | Disposition: A | Discharge status: Home / Self Care | Payer: Medicare Other | Source: Ambulatory Visit | Attending: Internal Medicine | Admitting: Internal Medicine

## 2022-01-30 VITALS — Wt 148.6 lb

## 2022-01-30 DIAGNOSIS — J449 Chronic obstructive pulmonary disease, unspecified: Secondary | ICD-10-CM | POA: Insufficient documentation

## 2022-01-30 NOTE — Progress Notes (Signed)
Daily Session Note ? ?Patient Details  ?Name: [name] ?MRN: [id number] ?Date of Birth: 19-Nov-1955 ?Referring Provider:   ?Flowsheet Row Pulmonary Rehab Walk Test from 01/03/2022 in [place]  ?Referring Provider Wert  ? ?  ? ? ?Encounter Date: 01/30/2022 ? ?Check In: ? Session Check In - 01/30/22 1508   ? ?  ? Check-In  ? Supervising physician immediately available to respond to emergencies [facility] immediately available   ? Physician(s) Dr. Bonner Puna   ? Location [facility]   ? Staff Present Elmon Else, MS, ACSM-CEP, Exercise Physiologist;Carlette Wilber Oliphant, RN, BSN;Ramon Dredge, RN, MHA;Gennell How Leonia Reeves, RN, BSN   ? Virtual Visit No   ? Medication changes reported     No   ? Fall or balance concerns reported    No   ? Tobacco Cessation No Change   ? Warm-up and Cool-down Performed as group-led instruction   ? Resistance Training Performed Yes   ? VAD Patient? No   ? PAD/SET Patient? No   ?  ? Pain Assessment  ? Currently in Pain? No/denies   ? Multiple Pain Sites No   ? ?  ?  ? ?  ? ? ?Capillary Blood Glucose: ?No results found for this or any previous visit (from the past 24 hour(s)). ? ? Exercise Prescription Changes - 01/30/22 1600   ? ?  ? Response to Exercise  ? Blood Pressure (Admit) 112/68   ? Blood Pressure (Exercise) 118/66   ? Blood Pressure (Exit) 98/60   ? Heart Rate (Admit) 74 bpm   ? Heart Rate (Exercise) 83 bpm   ? Heart Rate (Exit) 70 bpm   ? Oxygen Saturation (Admit) 98 %   ? Oxygen Saturation (Exercise) 98 %   ? Oxygen Saturation (Exit) 99 %   ? Rating of Perceived Exertion (Exercise) 12   ? Perceived Dyspnea (Exercise) 3   ? Duration Continue with 30 min of aerobic exercise without signs/symptoms of physical distress.   ? Intensity THRR unchanged   ?  ? Progression  ? Progression Continue to progress workloads to maintain intensity without signs/symptoms of physical distress.   ?  ? Resistance Training  ? Training Prescription Yes   ?  Weight blue bands   ? Reps 10-15   ? Time 10 Minutes   ?  ? Oxygen  ? Oxygen Continuous   ? Liters 2   ?  ? NuStep  ? Level 2   ? SPM 80   ? Minutes 15   ? METs 2.1   ?  ? Track  ? Laps 10   ? Minutes 15   ? METs 2.16   ? ?  ?  ? ?  ? ? ?Social History  ? ?Tobacco Use  ?Smoking Status Former  ? Packs/day: 1.00  ? Years: 40.00  ? Pack years: 40.00  ? Types: Cigarettes  ? Quit date: 12/14/2017  ? Years since quitting: 4.1  ?Smokeless Tobacco Never  ? ? ?Goals Met:  ?Proper associated with RPD/PD & O2 Sat ?Exercise tolerated well ?No report of concerns or symptoms today ?Strength training completed today ? ?Goals Unmet:  ?Not Applicable ? ?Comments: Service time is from 1315 to 1445 ? ? ? ?Dr. Rodman Pickle is Medical Director for Pulmonary Rehab at [facility].  ?

## 2022-02-01 ENCOUNTER — Encounter (HOSPITAL_COMMUNITY)
Admission: RE | Admit: 2022-02-01 | Discharge: 2022-02-01 | Disposition: A | Discharge status: Home / Self Care | Payer: Medicare Other | Source: Ambulatory Visit | Attending: Internal Medicine | Admitting: Internal Medicine

## 2022-02-01 DIAGNOSIS — J449 Chronic obstructive pulmonary disease, unspecified: Secondary | ICD-10-CM | POA: Diagnosis not present

## 2022-02-01 NOTE — Progress Notes (Signed)
Daily Session Note ? ?Patient Details  ?Name: [name] ?MRN: [id number] ?Date of Birth: 11/23/1955 ?Referring Provider:   ?Flowsheet Row Pulmonary Rehab Walk Test from 01/03/2022 in [place]  ?Referring Provider Wert  ? ?  ? ? ?Encounter Date: 02/01/2022 ? ?Check In: ? Session Check In - 02/01/22 1432   ? ?  ? Check-In  ? Supervising physician immediately available to respond to emergencies [facility] immediately available   ? Physician(s) Dr. Lottie Dawson   ? Location [facility]   ? Staff Present Rosebud Poles, RN, BSN;Carlette Wilber Oliphant, RN, Quentin Ore, MS, ACSM-CEP, Exercise Physiologist;David Makemson, MS, ACSM-CEP, CCRP, Exercise Physiologist   ? Virtual Visit No   ? Medication changes reported     No   ? Fall or balance concerns reported    No   ? Tobacco Cessation No Change   ? Warm-up and Cool-down Performed as group-led instruction   ? Resistance Training Performed Yes   ? VAD Patient? No   ? PAD/SET Patient? No   ?  ? Pain Assessment  ? Currently in Pain? Yes   ? Pain Score 1    ? Pain Location Leg   ? Multiple Pain Sites No   ? ?  ?  ? ?  ? ? ?Capillary Blood Glucose: ?No results found for this or any previous visit (from the past 24 hour(s)). ? ? ? ?Social History  ? ?Tobacco Use  ?Smoking Status Former  ? Packs/day: 1.00  ? Years: 40.00  ? Pack years: 40.00  ? Types: Cigarettes  ? Quit date: 12/14/2017  ? Years since quitting: 4.1  ?Smokeless Tobacco Never  ? ? ?Goals Met:  ?Proper associated with RPD/PD & O2 Sat ?Independence with exercise equipment ?Exercise tolerated well ?No report of concerns or symptoms today ?Strength training completed today ? ?Goals Unmet:  ?Not Applicable ? ?Comments: Service time is from 1320 to 1444.  ? ? ?Dr. Rodman Pickle is Medical Director for Pulmonary Rehab at [facility].  ?

## 2022-02-06 ENCOUNTER — Encounter (HOSPITAL_COMMUNITY): Payer: Medicare Other

## 2022-02-07 ENCOUNTER — Telehealth: Payer: Self-pay | Admitting: *Deleted

## 2022-02-07 ENCOUNTER — Ambulatory Visit (INDEPENDENT_AMBULATORY_CARE_PROVIDER_SITE_OTHER): Payer: Medicare Other | Admitting: Nurse Practitioner

## 2022-02-07 ENCOUNTER — Encounter: Payer: Self-pay | Admitting: Nurse Practitioner

## 2022-02-07 VITALS — BP 104/64 | HR 91 | Temp 98.3°F | Ht 64.0 in | Wt 149.2 lb

## 2022-02-07 DIAGNOSIS — J45901 Unspecified asthma with (acute) exacerbation: Secondary | ICD-10-CM

## 2022-02-07 DIAGNOSIS — C3492 Malignant neoplasm of unspecified part of left bronchus or lung: Secondary | ICD-10-CM | POA: Diagnosis not present

## 2022-02-07 DIAGNOSIS — J441 Chronic obstructive pulmonary disease with (acute) exacerbation: Secondary | ICD-10-CM | POA: Diagnosis not present

## 2022-02-07 DIAGNOSIS — C3491 Malignant neoplasm of unspecified part of right bronchus or lung: Secondary | ICD-10-CM | POA: Diagnosis not present

## 2022-02-07 DIAGNOSIS — C3490 Malignant neoplasm of unspecified part of unspecified bronchus or lung: Secondary | ICD-10-CM | POA: Diagnosis not present

## 2022-02-07 DIAGNOSIS — J9611 Chronic respiratory failure with hypoxia: Secondary | ICD-10-CM

## 2022-02-07 NOTE — Assessment & Plan Note (Addendum)
S/p wedge resection. Followed by Dr. Julien Nordmann with oncology. CTA chest in December without recurrence. Due for CT chest w/ contrast in July for surveillance. Advised we go ahead and schedule this given her frequent exacerbations and progressive DOE.  ?

## 2022-02-07 NOTE — Progress Notes (Signed)
? ?@Patient  ID: [name], female    DOB: 07-14-1956, 66 y.o.   MRN: [id number] ? ?Chief Complaint  ?Patient presents with  ? Follow-up  ?  Better on prednisone, once stopped, struggling again.  Nonproductive cough, sometimes sounds congested.  Had wheezing last night.  Unable to go to pulmonary rehab yesterday.  ? ? ?Referring provider: ?[name], NP ? ?HPI: ?66 year old female, former smoker (40 pack years) followed for COPD Gold 3. She is a patient of Dr. Gustavus Bryant and was last seen in office on 10/27/2021 by [facility] NP. She is also followed by Dr. 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 Past medical history significant for migraiens, GERD, chronic hepatitis C, cirrhosis, depression, tremor, tobacco abuse.  ? ?TEST/EVENTS:  ?12/14/2019 PFTs: FVC 2.63 (79), FEV1 1.02 (40), ratio 39, TLC 111%, DLCOcor 44%. +BD ?09/18/2021 CTA chest: No PE. Centrilobular and paraseptal emphysematous lung disease.  ?10/27/2021 CXR 2 view: lungs are clear. Trace right pleural effusion was stable when compared to prior exam ?10/27/2021: RAST positive for dust mites and low positive for dog dander; Eos 238 ?01/24/2022 CXR 2 view: pulmonary hyperinflation unchanged, consistent with COPD. Unchanged costophrenic angle blunting. No acute process  ? ?09/17/2021-09/20/2021: Hospitalization for COPD exacerbation and acute hypoxic respiratory failure. Tx with abx and steroids. CTA neg for PE. CT chest with RUL airspace infiltrate. COVID and flu neg. D/c on home O2. ?  ?09/23/2021: ED visit for SOB and chest tightness. COVID positive. D dimer neg after age adjustment - shared decision to not repeat CTA. Tx with molnupiravir and prednisone burst.  ?  ?10/06/2021: OV with Fitzgerald Dunne NP for hospital f/u. Improved post discharge and having COVID. Stepped up therapy to Trelegy daily given recent hospitalization and O2 requirement. Added flonase and saline nasal spray for rhinorrhea. Continued 2 lpm supplemental O2  for goal >88-90%. CXR with new small right pleural effusion - given clinical improvement, no tx and continue to monitor ? ?10/27/2021: OV with Keyanni Whittinghill NP. Worsening SOB. Stopped Trelegy due to worsening dry cough and started back on Symbicort. Her breathing had felt a little better on Trelegy. She reported nasal congestion and drainage without significant improvement with flonase. Oxygen was stable on 2 lpm. CBC with diff showed elevated eosinophils and RAST was positive for dust mites and dog dander. She was started on Singulair and claritin. Switched to [employer] as DPI likely exacerbated cough. Prednisone taper for AECOPD/asthma. Added on astelin for rhinitis. CXR was clear with trace, stable right pleural effusion  ? ?12/21/2021: OV with Lorana Maffeo NP.  Patient presents today for worsening shortness of breath over the past week or two. Since being seen last, she ran out of her Judithann Sauger and was just able to get restarted on it 3-4 days ago. She has a minimal, dry cough that she feels like is unchanged from her baseline. Her sinus symptoms have improved since we saw her last. She also feels like the singulair has helped a lot. Some occasional wheezing.  Treated with prednisone burst.  CXR without acute process.  Did not have any increased O2 requirements; continue 2 L/min supplemental O2. ? ?01/24/2022: OV with Duc Crocket NP for AECOPD. Feels like her symptoms started around 2 to 3 weeks ago.  She feels like her activity tolerance has declined and she is not able to walk very much when she attends pulmonary rehab.  She also has a productive cough with white sputum production.  Has not noticed any excessive  wheezing or URI symptoms.   She continues on 2 L/min supplemental O2 and maintains oxygen saturations in the 90s.  Her husband did recently passed away towards the end of March and she has been grieving from this.  Feels as though it has added some stress and possibly negatively contributed to her breathing as well, understandably  so.  She continues on [place] twice daily, Singulair, Flonase, and Claritin.  Using albuterol more than she previously was. Treated with prednisone taper and z pack. Advised that daily prednisone may need to be considered moving forward.  ? ?02/07/2022: Today - follow up ?Patient presents today for follow up but is also having some acute symptoms. Reports that she finished the prednisone taper a few days ago and has increased DOE since. She was unable to attend pulmonary rehab yesterday due to this. Cough is improved since last time; still occasional and primarily dry. She has noticed an occasional wheeze. She denies hemoptysis, anorexia, weight loss, lower extremity edema. She continues on [place] Twice daily. Using albuterol more frequently the last few days. Wearing supplemental O2 2 lpm without increased demand.  ? ?Allergies  ?Allergen Reactions  ? Aspirin Shortness Of Breath  ? Trelegy Ellipta [Fluticasone-Umeclidin-Vilant] Shortness Of Breath  ?  She reports that is made her cough worse.   ? Morphine Sulfate Other (See Comments)  ?  RED STREAKS ON ARMS.  ? Oxycodone Itching and Rash  ? Glycopyrrolate Other (See Comments)  ?  UNSPECIFIED REACTION  ?pt unsure if she is allergic.  ? Lactose Intolerance (Gi) Nausea And Vomiting  ?  Milk only.Patient states that she can eat cheese.  ? Penicillins Other (See Comments)  ?   Heartburn, upset stomach only & tolerated AMOXIL after this reaction ?Has patient had a PCN reaction causing immediate rash, facial/tongue/throat swelling, SOB or lightheadedness with hypotension: No ?Has patient had a PCN reaction causing severe rash involving mucus membranes or skin necrosis: No ?Has patient had a PCN reaction that required hospitalization: No ?Has patient had a PCN reaction occurring within the last 10 years: #  #  #  YES  #  #  #   ? Sulfa Antibiotics Tinitus  ? ? ?Immunization History  ?Administered Date(s) Administered  ? Fluad Quad(high Dose 65+) 06/29/2021  ? Influenza  Split 07/29/2012  ? Influenza,inj,Quad PF,6+ Mos 06/27/2019, 07/14/2020  ? Influenza-Unspecified 07/01/2017, 07/09/2017, 07/09/2018  ? Moderna Sars-Covid-2 Vaccination 01/18/2020, 02/17/2020  ? Pneumococcal Conjugate-13 09/04/2019  ? Pneumococcal Polysaccharide-23 11/13/2017  ? Tdap 01/21/2014  ? ? ?Past Medical History:  ?Diagnosis Date  ? Anxiety   ? 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 Bronchitis   ? Depression   ? Dyspnea   ? Emphysema, unspecified ([place])   ? per patient mild case  ? Family history of adverse reaction to anesthesia   ? patient states sister has a hard time waking up from anesthesia.   ? Hepatitis C   ? lung ca dx'd 03/2018  ? Tremor, unspecified   ? non-specific; takes inderal.   ? ? ?Tobacco History: ?Social History  ? ?Tobacco Use  ?Smoking Status Former  ? Packs/day: 1.00  ? Years: 40.00  ? Pack years: 40.00  ? Types: Cigarettes  ? Quit date: 12/14/2017  ? Years since quitting: 4.1  ?Smokeless Tobacco Never  ? ?Counseling given: Not Answered ? ? ?Outpatient Medications Prior to Visit  ?Medication Sig Dispense Refill  ? acetaminophen (TYLENOL) 500 MG tablet Take 1,000 mg by mouth every 6 (six)  hours as needed for mild pain.    ? albuterol (VENTOLIN HFA) 108 (90 Base) MCG/ACT inhaler Inhale 2 puffs into the lungs every 6 (six) hours as needed for wheezing or shortness of breath. 8 g 0  ? azelastine (ASTELIN) 0.1 % nasal spray Place 2 sprays into both nostrils 2 (two) times daily. Use in each nostril as directed 30 mL 6  ? azithromycin (ZITHROMAX) 250 MG tablet Take 2 tablets on day 1 then followed by 1 tab daily for four additional days. 6 tablet 0  ? fluticasone (FLONASE) 50 MCG/ACT nasal spray Place 1 spray into both nostrils daily. 18.2 mL 2  ? guaiFENesin (MUCINEX) 600 MG 12 hr tablet Take 600 mg by mouth 2 (two) times daily as needed for cough or to loosen phlegm.    ? ipratropium (ATROVENT) 0.02 % nebulizer solution Take 0.5 mg by nebulization as needed for wheezing or shortness of breath.    ?  loratadine (CLARITIN) 10 MG tablet Take 1 tablet (10 mg total) by mouth daily. 30 tablet 3  ? Melatonin 10 MG TABS Take 40 mg by mouth at bedtime.    ? montelukast (SINGULAIR) 10 MG tablet Take by mouth.

## 2022-02-07 NOTE — Assessment & Plan Note (Signed)
Mild AECOPD/asthma. She continues to have a high symptom burden and continues to have flares after coming off prednisone. Advise we tx with prednisone burst then transition to daily prednisone 10 mg. Continue triple therapy with Breztri and PRN albuterol. Continue singulair for trigger prevention.  ? ?Patient Instructions  ?-Continue Breztri 2 puffs Twice daily. Brush tongue and rinse mouth afterwards ?-Continue Albuterol inhaler 2 puffs every 6 hours as needed for shortness of breath or wheezing. Notify if symptoms persist despite rescue inhaler/neb use. ?-Continue flonase 2 sprays each nostril daily ?-Continue mucinex 600 mg Twice daily  ?-Continue omeprazole 20 mg daily ?-Continue supplemental oxygen at 2 lpm for goal oxygen saturation >88-90%. Notify of increasing requirements ?-Continue Astelin nasal spray 2 sprays each nostril Twice daily  ?-Continue Claritin (loratidine) 10 mg daily for allergies ?-Continue singulair 10 mg At bedtime  ?  ?Prednisone 40 mg daily for 5 days then start 10 mg daily. Take in AM with food.  ? ?Continue going to pulmonary rehab  ?  ?Follow up with Dr. Melvyn Novas as scheduled, bring all of your medications with you. If symptoms do not improve or worsen, please contact office for sooner follow up or seek emergency care. ? ? ?

## 2022-02-07 NOTE — Assessment & Plan Note (Signed)
Stable. No increased O2 demand. Continue 2 lpm POC for goal >88-90% ?

## 2022-02-07 NOTE — Telephone Encounter (Signed)
Called and spoke with patient to let her know that Joellen Jersey went ahead and ordered her CT scan and someone would be calling her in the next week or so.  The scan for Dr. Neil Crouch was not scheduled until July and she wanted to do one sooner d/t her recent respiratory problems.  She verbalized understanding.  Nothing further needed. ?

## 2022-02-07 NOTE — Patient Instructions (Addendum)
-  Continue Breztri 2 puffs Twice daily. Brush tongue and rinse mouth afterwards ?-Continue Albuterol inhaler 2 puffs every 6 hours as needed for shortness of breath or wheezing. Notify if symptoms persist despite rescue inhaler/neb use. ?-Continue flonase 2 sprays each nostril daily ?-Continue mucinex 600 mg Twice daily  ?-Continue omeprazole 20 mg daily ?-Continue supplemental oxygen at 2 lpm for goal oxygen saturation >88-90%. Notify of increasing requirements ?-Continue Astelin nasal spray 2 sprays each nostril Twice daily  ?-Continue Claritin (loratidine) 10 mg daily for allergies ?-Continue singulair 10 mg At bedtime  ?  ?Prednisone 40 mg daily for 5 days then start 10 mg daily. Take in AM with food.  ? ?Continue going to pulmonary rehab  ?  ?Follow up with Dr. Melvyn Novas as scheduled, bring all of your medications with you. If symptoms do not improve or worsen, please contact office for sooner follow up or seek emergency care. ?

## 2022-02-08 ENCOUNTER — Telehealth: Payer: Self-pay | Admitting: Nurse Practitioner

## 2022-02-08 ENCOUNTER — Encounter (HOSPITAL_COMMUNITY): Payer: Medicare Other

## 2022-02-08 MED ORDER — PREDNISONE 10 MG PO TABS
ORAL_TABLET | ORAL | 5 refills | Status: DC
Start: 2022-02-08 — End: 2022-03-07

## 2022-02-08 NOTE — Telephone Encounter (Signed)
Patient Instructions  ?-Continue Breztri 2 puffs Twice daily. Brush tongue and rinse mouth afterwards ?-Continue Albuterol inhaler 2 puffs every 6 hours as needed for shortness of breath or wheezing. Notify if symptoms persist despite rescue inhaler/neb use. ?-Continue flonase 2 sprays each nostril daily ?-Continue mucinex 600 mg Twice daily  ?-Continue omeprazole 20 mg daily ?-Continue supplemental oxygen at 2 lpm for goal oxygen saturation >88-90%. Notify of increasing requirements ?-Continue Astelin nasal spray 2 sprays each nostril Twice daily  ?-Continue Claritin (loratidine) 10 mg daily for allergies ?-Continue singulair 10 mg At bedtime  ?  ?Prednisone 40 mg daily for 5 days then start 10 mg daily. Take in AM with food.  ? ? ? ?Rx for prednisone has been sent to preferred pharmacy for pt. Called and spoke with pt letting her know this had been done and she verbalized understanding. Nothing further needed. ?

## 2022-02-13 ENCOUNTER — Encounter (HOSPITAL_COMMUNITY): Payer: Medicare Other

## 2022-02-13 ENCOUNTER — Telehealth (HOSPITAL_COMMUNITY): Payer: Self-pay | Admitting: *Deleted

## 2022-02-13 NOTE — Telephone Encounter (Signed)
Called [name] to check on her due to her absences from pulmonary rehab program. She has had absences due to pulmonary illnesses.I was unable to reach her but, I was able to leave a message for her to return my call. ?

## 2022-02-13 NOTE — Telephone Encounter (Signed)
Memorie returned my call. We discussed her absences and that she needs consistent attendance to get benefit from that program. She states that every time she comes off of the steroids that she has so much trouble with shortness of breath. Laiklyn agrees with me that to drop from the program but, she would like to return. I explained to her that she would need another referral from Dr. Melvyn Novas when her condition is more stable. ?

## 2022-02-14 NOTE — Progress Notes (Signed)
Discharge Progress Report ? ?Patient Details  ?Name: [name] ?MRN: [id number] ?Date of Birth: 06/03/1956 ?Referring Provider:   ?Flowsheet Row Pulmonary Rehab Walk Test from 01/03/2022 in [facility]  ?Referring Provider Wert  ? ?  ? ? ? ?Number of Visits: 7 ? ?Reason for Discharge: Pt discharged due to absences and illness. She has had several episodes of coming off of her steroids and each time she has had to go back on them and had absences. ? ? ?Smoking History:  ?Social History  ? ?Tobacco Use  ?Smoking Status Former  ? Packs/day: 1.00  ? Years: 40.00  ? Pack years: 40.00  ? Types: Cigarettes  ? Quit date: 12/14/2017  ? Years since quitting: 4.1  ?Smokeless Tobacco Never  ? ? ?Diagnosis:  ?Stage 3 severe COPD by GOLD classification ([place]) ? ?ADL [facility]: ? Pulmonary Assessment Scores   ? ? [place] Name 01/03/22 3154  ?  ?  ?  ? ADL [facility]  ? ADL Phase Entry    ? SOB Score total 35    ?  ? CAT Score  ? CAT Score 22    ? ?  ?  ? ?  ? ? ?Initial Exercise Prescription: ? Initial Exercise Prescription - 01/03/22 1000   ? ?  ? Date of Initial Exercise RX and Referring Provider  ? Date 01/03/22   ? Referring Provider Wert   ? Expected Discharge Date 03/08/22   ?  ? Oxygen  ? Oxygen Continuous   ? Liters 2   ? Maintain Oxygen Saturation 88% or higher   ?  ? NuStep  ? Level 1   ? SPM 60   ? Minutes 15   ?  ? Track  ? Minutes 15   ? METs 2   ?  ? Prescription Details  ? Frequency (times per week) 2   ? Duration Progress to 30 minutes of continuous aerobic without signs/symptoms of physical distress   ?  ? Intensity  ? THRR 40-80% of Max Heartrate 62-123   ? Ratings of Perceived Exertion 11-13   ? Perceived Dyspnea 0-4   ?  ? Progression  ? Progression Continue to progress workloads to maintain intensity without signs/symptoms of physical distress.   ?  ? Resistance Training  ? Training Prescription Yes   ? Weight red bands   ? Reps 10-15   ? ?  ?  ? ?  ? ? ?Discharge Exercise Prescription (Final  Exercise Prescription Changes): ? Exercise Prescription Changes - 02/13/22 1500   ? ?  ? Response to Exercise  ? Blood Pressure (Admit) 122/78   ? Blood Pressure (Exit) 98/60   ? Heart Rate (Admit) 60 bpm   ? Heart Rate (Exercise) 78 bpm   ? Heart Rate (Exit) 69 bpm   ? Oxygen Saturation (Admit) 100 %   ? Oxygen Saturation (Exercise) 97 %   ? Oxygen Saturation (Exit) 99 %   ? Rating of Perceived Exertion (Exercise) 13   ? Perceived Dyspnea (Exercise) 1   ? Duration Continue with 30 min of aerobic exercise without signs/symptoms of physical distress.   ? Intensity THRR unchanged   ?  ? Progression  ? Progression Continue to progress workloads to maintain intensity without signs/symptoms of physical distress.   ?  ? Resistance Training  ? Training Prescription Yes   ? Weight blue bands   ? Reps 10-15   ? Time 10 Minutes   ?  ?  Oxygen  ? Oxygen Continuous   ? Liters 2   ?  ? NuStep  ? Level 2   ? SPM 80   ? Minutes 15   ? METs 2   ?  ? Track  ? Laps 8   ? Minutes 15   ? METs 1.93   ?  ? Oxygen  ? Maintain Oxygen Saturation 88% or higher   ? ?  ?  ? ?  ? ? ?Functional Capacity: ? 6 Minute Walk   ? ? [place] Name 01/03/22 1025  ?  ?  ?  ? 6 Minute Walk  ? Phase Initial    ? Distance 1025 feet    ? Walk Time 6 minutes    ? # of Rest Breaks 1  5:20-6:00    ? MPH 1.94    ? METS 2.37    ? RPE 8.3    ? Perceived Dyspnea  2    ? VO2 Peak 8.3    ? Symptoms Yes (comment)    ? Comments 8/10 bilat calf pain    ? Resting HR 72 bpm    ? Resting BP 114/60    ? Resting Oxygen Saturation  98 %    ? Exercise Oxygen Saturation  during 6 min walk 95 %    ? Max Ex. HR 92 bpm    ? Max Ex. BP 122/68    ? 2 Minute Post BP 110/60    ?  ? Interval HR  ? 1 Minute HR 85    ? 2 Minute HR 89    ? 3 Minute HR 92    ? 4 Minute HR 90    ? 5 Minute HR 89    ? 6 Minute HR 82    ? 2 Minute Post HR 78    ? Interval Heart Rate? Yes    ?  ? Interval Oxygen  ? Interval Oxygen? Yes    ? Baseline Oxygen Saturation % 98 %    ? 1 Minute Oxygen Saturation % 95 %    ?  1 Minute Liters of Oxygen 2 L    ? 2 Minute Oxygen Saturation % 95 %    ? 2 Minute Liters of Oxygen 2 L    ? 3 Minute Oxygen Saturation % 96 %    ? 3 Minute Liters of Oxygen 2 L    ? 4 Minute Oxygen Saturation % 96 %    ? 4 Minute Liters of Oxygen 2 L    ? 5 Minute Oxygen Saturation % 94 %    ? 5 Minute Liters of Oxygen 2 L    ? 6 Minute Oxygen Saturation % 97 %    ? 6 Minute Liters of Oxygen 2 L    ? 2 Minute Post Oxygen Saturation % 98 %    ? 2 Minute Post Liters of Oxygen 2 L    ? ?  ?  ? ?  ? ? ?Psychological, QOL, Others - Outcomes: ?PHQ 2/9: ? ?  01/03/2022  ?  9:26 AM 09/27/2021  ?  1:47 PM 09/22/2021  ?  1:06 PM 11/13/2017  ?  4:10 PM 10/10/2017  ?  3:59 PM  ?Depression screen PHQ 2/9  ?Decreased Interest 0 3 0 0 0  ?Down, Depressed, Hopeless 0 0 0 0 0  ?PHQ - 2 Score 0 3 0 0 0  ?Altered sleeping 0 2     ?Tired, decreased energy 0 3     ?  Change in appetite 0 0     ?Feeling bad or failure about yourself  0 0     ?Trouble concentrating 0 0     ?Moving slowly or fidgety/restless 0 0     ?Suicidal thoughts 0 0     ?PHQ-9 Score 0 8     ?Difficult doing work/chores Not difficult at all      ? ? ?Quality of Life: ? ? ?Personal Goals: ?Goals established at orientation with interventions provided to work toward goal. ? Personal Goals and Risk Factors at Admission - 01/03/22 1002   ? ?  ? Core Components/Risk Factors/Patient Goals on Admission  ? Improve shortness of breath with ADL's Yes   ? Intervention Provide education, individualized exercise plan and daily activity instruction to help decrease symptoms of SOB with activities of daily living.   ? Expected Outcomes Short Term: Improve cardiorespiratory fitness to achieve a reduction of symptoms when performing ADLs;Long Term: Be able to perform more ADLs without symptoms or delay the onset of symptoms   ? Increase knowledge of respiratory medications and ability to use respiratory devices properly  Yes   ? Intervention Provide education and demonstration as needed of  appropriate use of medications, inhalers, and oxygen therapy.   ? Expected Outcomes Short Term: Achieves understanding of medications use. Understands that oxygen is a medication prescribed by physician. Demonstrates appropriate use of inhaler and oxygen therapy.;Long Term: Maintain appropriate use of medications, inhalers, and oxygen therapy.   ? ?  ?  ? ?  ?  ? ?Personal Goals Discharge: ? Goals and Risk Factor Review   ? ? [place] Name 01/16/22 0956  ?  ?  ?  ?  ?  ? Core Components/Risk Factors/Patient Goals Review  ? Personal Goals Review Develop more efficient breathing techniques such as purse lipped breathing and diaphragmatic breathing and practicing self-pacing with activity.;Increase knowledge of respiratory medications and ability to use respiratory devices properly.;Improve shortness of breath with ADL's      ? Review Petrice has attended 2 exercise sessions, has just begun and it is too early to see progression towards goals.      ? Expected Outcomes See admission goals.      ? ?  ?  ? ?  ? ? ?Exercise Goals and Review: ? Exercise Goals   ? ? [place] Name 01/03/22 1027 01/18/22 0908 02/13/22 0812  ?  ?  ?  ? Exercise Goals  ? Increase Physical Activity Yes Yes Yes    ? Intervention Provide advice, education, support and counseling about physical activity/exercise needs.;Develop an individualized exercise prescription for aerobic and resistive training based on initial evaluation findings, risk stratification, comorbidities and participant's personal goals. Provide advice, education, support and counseling about physical activity/exercise needs.;Develop an individualized exercise prescription for aerobic and resistive training based on initial evaluation findings, risk stratification, comorbidities and participant's personal goals. Provide advice, education, support and counseling about physical activity/exercise needs.;Develop an individualized exercise prescription for aerobic and resistive training based on  initial evaluation findings, risk stratification, comorbidities and participant's personal goals.    ? Expected Outcomes Short Term: Attend rehab on a regular basis to increase amount of physical acti

## 2022-02-15 ENCOUNTER — Encounter (HOSPITAL_COMMUNITY): Payer: Medicare Other

## 2022-02-16 ENCOUNTER — Ambulatory Visit (HOSPITAL_COMMUNITY)
Admission: RE | Admit: 2022-02-16 | Discharge: 2022-02-16 | Disposition: A | Discharge status: Home / Self Care | Payer: Medicare Other | Source: Ambulatory Visit | Attending: Nurse Practitioner | Admitting: Nurse Practitioner

## 2022-02-16 DIAGNOSIS — C3492 Malignant neoplasm of unspecified part of left bronchus or lung: Secondary | ICD-10-CM | POA: Diagnosis not present

## 2022-02-16 DIAGNOSIS — C3491 Malignant neoplasm of unspecified part of right bronchus or lung: Secondary | ICD-10-CM | POA: Diagnosis not present

## 2022-02-16 DIAGNOSIS — C3490 Malignant neoplasm of unspecified part of unspecified bronchus or lung: Secondary | ICD-10-CM | POA: Diagnosis not present

## 2022-02-16 MED ORDER — IOHEXOL 300 MG/ML  SOLN
75.0000 mL | Freq: Once | INTRAMUSCULAR | Status: AC | PRN
  Administered 2022-02-16: 75 mL via INTRAVENOUS

## 2022-02-16 MED ORDER — SODIUM CHLORIDE (PF) 0.9 % IJ SOLN
INTRAMUSCULAR | Status: AC
  Filled 2022-02-16: qty 50

## 2022-02-19 ENCOUNTER — Telehealth: Payer: Self-pay

## 2022-02-19 NOTE — Telephone Encounter (Signed)
Pt LVM wanting to know if she needs to do another CT scan given she completed one yesterday for another provider.  I have confirmed with Dr. Julien Nordmann, pt does not need to repeat her CT Chest w/contrast since she had it done yesterday.  I have called the pt back and advised of this. Pt expressed understanding of this information.  I have cancelled the CT Chest order.

## 2022-02-20 ENCOUNTER — Encounter (HOSPITAL_COMMUNITY): Payer: Medicare Other

## 2022-02-21 ENCOUNTER — Telehealth: Payer: Self-pay | Admitting: Nurse Practitioner

## 2022-02-21 ENCOUNTER — Encounter: Payer: Self-pay | Admitting: Adult Health

## 2022-02-21 ENCOUNTER — Ambulatory Visit (INDEPENDENT_AMBULATORY_CARE_PROVIDER_SITE_OTHER): Payer: Medicare Other | Admitting: Adult Health

## 2022-02-21 VITALS — BP 120/80 | HR 68 | Temp 98.1°F | Ht 64.0 in | Wt 154.0 lb

## 2022-02-21 DIAGNOSIS — K2100 Gastro-esophageal reflux disease with esophagitis, without bleeding: Secondary | ICD-10-CM

## 2022-02-21 DIAGNOSIS — C3490 Malignant neoplasm of unspecified part of unspecified bronchus or lung: Secondary | ICD-10-CM

## 2022-02-21 DIAGNOSIS — F32A Depression, unspecified: Secondary | ICD-10-CM

## 2022-02-21 DIAGNOSIS — F419 Anxiety disorder, unspecified: Secondary | ICD-10-CM | POA: Diagnosis not present

## 2022-02-21 MED ORDER — PANTOPRAZOLE SODIUM 40 MG PO TBEC: 40.0000 mg | DELAYED_RELEASE_TABLET | Freq: Every day | ORAL | 0 refills | Status: DC

## 2022-02-21 MED ORDER — TRAZODONE HCL 100 MG PO TABS: 100.0000 mg | ORAL_TABLET | Freq: Every evening | ORAL | 1 refills | Status: DC | PRN

## 2022-02-21 MED ORDER — SUCRALFATE 1 G PO TABS: 1.0000 g | ORAL_TABLET | Freq: Three times a day (TID) | ORAL | 0 refills | Status: DC

## 2022-02-21 NOTE — Progress Notes (Signed)
Subjective:    Patient ID: [name], female    DOB: 27-Mar-1956, 66 y.o.   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  She presents to the office today for " heart burn". Her symptoms have been present for the last two to three weeks. Symptoms include that of nausea, burping, epigastric abdominal pain, burning in her throat and a sour taste in her mouth when she wakes up. She has been using OTC Prilosec without relief. She denies vomiting, diarrhea, or blood in stool   She also needs a refill of Trazodone that she takes for anxiety and depression   Review of Systems See HPI   Past Medical History:  Diagnosis Date   Anxiety    Arthritis of hand 04/17/2017   Bronchitis    Depression    Dyspnea    Emphysema, unspecified ([place])    per patient mild case   Family history of adverse reaction to anesthesia    patient states sister has a hard time waking up from anesthesia.    Hepatitis C    lung ca dx'd 03/2018   Tremor, unspecified    non-specific; takes inderal.     Social History   Socioeconomic History   Marital status: Widowed    Spouse name: Not on file   Number of children: Not on file   Years of education: Not on file   Highest education level: 12th grade  Occupational History   Not on file  Tobacco Use   Smoking status: Former    Packs/day: 1.00    Years: 40.00    Pack years: 40.00    Types: Cigarettes    Quit date: 12/14/2017    Years since quitting: 4.1   Smokeless tobacco: Never  Vaping Use   Vaping Use: Never used  Substance and Sexual Activity   Alcohol use: Yes    Alcohol/week: 7.0 standard drinks    Types: 7 Shots of liquor per week    Comment: occasional bourbon   Drug use: Not Currently   Sexual  activity: Never  Other Topics Concern   Not on file  Social History Narrative   Not on file   Social Determinants of Health   Financial Resource Strain: Low Risk    Difficulty of Paying Living Expenses: Not hard at all  Food Insecurity: No Food Insecurity   Worried About Charity fundraiser in the Last Year: Never true   [place] in the Last Year: Never true  Transportation Needs: No Transportation Needs   Lack of Transportation (Medical): No   Lack of Transportation (Non-Medical): No  Physical Activity: Inactive   Days of Exercise per Week: 0 days   Minutes of Exercise per Session: 0 min  Stress: No Stress Concern Present   Feeling of Stress : Not at all  Social Connections: Moderately Isolated   Frequency of Communication with Friends and Family: More than three times a week   Frequency of Social Gatherings with Friends and Family: More than three times a week   Attends Religious Services: Never   Marine scientist or Organizations: No   Attends Archivist Meetings: Never   Marital Status: Married  Human resources officer Violence: Not At Risk   Fear of Current or Ex-Partner:  No   Emotionally Abused: No   Physically Abused: No   Sexually Abused: No    Past Surgical History:  Procedure Laterality Date   ABDOMINAL HYSTERECTOMY  1986   menorrhagia; hx of cryo for abnormal paps   APPENDECTOMY     removed in 1986 per pt   INTERCOSTAL NERVE BLOCK Right 12/18/2019   Procedure: Intercostal Nerve Block;  Surgeon: Melrose Nakayama, MD;  Location: [facility];  Service: Thoracic;  Laterality: Right;   LOBECTOMY Left 03/13/2018   Procedure: Lingula section of left upper lobe lobectomy;  Surgeon: Melrose Nakayama, MD;  Location: [facility];  Service: Thoracic;  Laterality: Left;   NODE DISSECTION  12/18/2019   Procedure: Node Dissection;  Surgeon: Melrose Nakayama, MD;  Location: [facility];  Service: Thoracic;;   VIDEO ASSISTED THORACOSCOPY (VATS)/WEDGE RESECTION Left  03/13/2018   Procedure: VIDEO ASSISTED THORACOSCOPY (VATS)/WEDGE RESECTION;  Surgeon: Melrose Nakayama, MD;  Location[facility];  Service: Thoracic;  Laterality: Left;    Family History  Problem Relation Age of Onset   Asthma Mother    Alzheimer's disease Mother 39   Hypertension Mother    Other Father        shot    Neuropathy Sister        face   Cancer Maternal Aunt        breast   Cancer Paternal Aunt    Cancer Paternal Grandmother        abdominal   Lung cancer Paternal Uncle     Allergies  Allergen Reactions   Aspirin Shortness Of Breath   Trelegy Ellipta [Fluticasone-Umeclidin-Vilant] Shortness Of Breath    She reports that is made her cough worse.    Morphine Sulfate Other (See Comments)    RED STREAKS ON ARMS.   Oxycodone Itching and Rash   Glycopyrrolate Other (See Comments)    UNSPECIFIED REACTION  pt unsure if she is allergic.   Lactose Intolerance (Gi) Nausea And Vomiting    Milk only.Patient states that she can eat cheese.   Penicillins Other (See Comments)     Heartburn, upset stomach only & tolerated AMOXIL after this reaction Has patient had a PCN reaction causing immediate rash, facial/tongue/throat swelling, SOB or lightheadedness with hypotension: No Has patient had a PCN reaction causing severe rash involving mucus membranes or skin necrosis: No Has patient had a PCN reaction that required hospitalization: No Has patient had a PCN reaction occurring within the last 10 years: #  #  #  YES  #  #  #    Sulfa Antibiotics Tinitus    Current Outpatient Medications on File Prior to Visit  Medication Sig Dispense Refill   acetaminophen (TYLENOL) 500 MG tablet Take 1,000 mg by mouth every 6 (six) hours as needed for mild pain.     albuterol (VENTOLIN HFA) 108 (90 Base) MCG/ACT inhaler Inhale 2 puffs into the lungs every 6 (six) hours as needed for wheezing or shortness of breath. 8 g 0   azelastine (ASTELIN) 0.1 % nasal spray Place 2 sprays into both  nostrils 2 (two) times daily. Use in each nostril as directed 30 mL 6   fluticasone (FLONASE) 50 MCG/ACT nasal spray Place 1 spray into both nostrils daily. 18.2 mL 2   guaiFENesin (MUCINEX) 600 MG 12 hr tablet Take 600 mg by mouth 2 (two) times daily as needed for cough or to loosen phlegm.     ipratropium (ATROVENT) 0.02 % nebulizer solution Take 0.5  mg by nebulization as needed for wheezing or shortness of breath.     loratadine (CLARITIN) 10 MG tablet Take 1 tablet (10 mg total) by mouth daily. 30 tablet 3   Melatonin 10 MG TABS Take 40 mg by mouth at bedtime.     montelukast (SINGULAIR) 10 MG tablet Take by mouth.     omeprazole (PRILOSEC) 20 MG capsule Take 20 mg by mouth daily.     PARoxetine (PAXIL) 40 MG tablet Take 1 tablet (40 mg total) by mouth daily. 90 tablet 1   predniSONE (DELTASONE) 10 MG tablet Take 40mg  x5 days then 10mg  daily. 30 tablet 5   propranolol ER (INDERAL LA) 60 MG 24 hr capsule Take 1 capsule (60 mg total) by mouth daily. 90 capsule 1   traZODone (DESYREL) 100 MG tablet Take 1 tablet (100 mg total) by mouth at bedtime as needed for sleep. 90 tablet 1   No current facility-administered medications on file prior to visit.    BP 120/80 (BP Location: Left Arm, Patient Position: Sitting, Cuff Size: Normal)   Pulse 68   Temp 98.1 F (36.7 C) (Oral)   Ht 5\' 4"  (1.626 m)   Wt 154 lb (69.9 kg)   SpO2 98% Comment: O2 on at 2L/min via nasal cannula  BMI 26.43 kg/m       Objective:   Physical Exam Vitals and nursing note reviewed.  Constitutional:      Appearance: Normal appearance.  Abdominal:     General: Abdomen is flat. Bowel sounds are normal.     Palpations: Abdomen is soft. There is no mass.     Tenderness: There is abdominal tenderness in the epigastric area.     Hernia: No hernia is present.  Skin:    General: Skin is warm and dry.     Capillary Refill: Capillary refill takes less than 2 seconds.  Neurological:     General: No focal deficit  present.     Mental Status: She is alert.  Psychiatric:        Mood and Affect: Mood normal.        Behavior: Behavior normal.        Thought Content: Thought content normal.        Judgment: Judgment normal.      Assessment & Plan:  1. Gastroesophageal reflux disease with esophagitis without hemorrhage - bland diet  - Follow up if not improving in the next 7-14 days.  - Consider referral to GI  - pantoprazole (PROTONIX) 40 MG tablet; Take 1 tablet (40 mg total) by mouth daily.  Dispense: 90 tablet; Refill: 0 - sucralfate (CARAFATE) 1 g tablet; Take 1 tablet (1 g total) by mouth 4 (four) times daily -  with meals and at bedtime.  Dispense: 120 tablet; Refill: 0  2. Anxiety and depression  - traZODone (DESYREL) 100 MG tablet; Take 1 tablet (100 mg total) by mouth at bedtime as needed for sleep.  Dispense: 90 tablet; Refill: 1  [name], NP

## 2022-02-21 NOTE — Telephone Encounter (Signed)
Discussed CT chest findings with patient. There is a new 10x12x15 mm nodule in the LLL, concerning for possible recurrence of lung cancer but could also be inflammatory. No infectious symptoms. Spoke with Dr. Julien Nordmann and we agree obtaining a PET scan is the appropriate next move given her history. Order placed today. Follow up with Dr. Julien Nordmann after the scan. Pt verbalized understanding.

## 2022-02-22 ENCOUNTER — Encounter: Payer: Self-pay | Admitting: Adult Health

## 2022-02-22 ENCOUNTER — Encounter (HOSPITAL_COMMUNITY): Payer: Medicare Other

## 2022-02-27 ENCOUNTER — Encounter (HOSPITAL_COMMUNITY): Payer: Medicare Other

## 2022-03-01 ENCOUNTER — Encounter (HOSPITAL_COMMUNITY): Payer: Medicare Other

## 2022-03-02 ENCOUNTER — Ambulatory Visit (HOSPITAL_COMMUNITY)
Admission: RE | Admit: 2022-03-02 | Discharge: 2022-03-02 | Disposition: A | Discharge status: Home / Self Care | Payer: Medicare Other | Source: Ambulatory Visit | Attending: Nurse Practitioner | Admitting: Nurse Practitioner

## 2022-03-02 DIAGNOSIS — R933 Abnormal findings on diagnostic imaging of other parts of digestive tract: Secondary | ICD-10-CM | POA: Insufficient documentation

## 2022-03-02 DIAGNOSIS — C3490 Malignant neoplasm of unspecified part of unspecified bronchus or lung: Secondary | ICD-10-CM | POA: Diagnosis not present

## 2022-03-02 DIAGNOSIS — R911 Solitary pulmonary nodule: Secondary | ICD-10-CM | POA: Insufficient documentation

## 2022-03-02 LAB — GLUCOSE, CAPILLARY: Glucose-Capillary: 107 mg/dL — ABNORMAL HIGH (ref 70–99)

## 2022-03-02 MED ORDER — FLUDEOXYGLUCOSE F - 18 (FDG) INJECTION
7.5000 | Freq: Once | INTRAVENOUS | Status: AC
  Administered 2022-03-02: 7.6 via INTRAVENOUS

## 2022-03-05 NOTE — Progress Notes (Signed)
Notified patient that PET scan was reassuring and did not show any signs of hypermetabolism, which is great news. She is scheduled with Dr. Melvyn Novas on 6/7 for follow up. I will also forward this to Dr. Julien Nordmann. She did have an abnormal appearance to her cecum - recommended follow up with her PCP to discuss next steps. Verbalized understanding.

## 2022-03-06 ENCOUNTER — Encounter (HOSPITAL_COMMUNITY): Payer: Medicare Other

## 2022-03-07 ENCOUNTER — Encounter: Payer: Self-pay | Admitting: Internal Medicine

## 2022-03-07 ENCOUNTER — Ambulatory Visit (INDEPENDENT_AMBULATORY_CARE_PROVIDER_SITE_OTHER): Payer: Medicare Other | Admitting: Internal Medicine

## 2022-03-07 ENCOUNTER — Telehealth: Payer: Self-pay | Admitting: Adult Health

## 2022-03-07 DIAGNOSIS — J449 Chronic obstructive pulmonary disease, unspecified: Secondary | ICD-10-CM

## 2022-03-07 DIAGNOSIS — R059 Cough, unspecified: Secondary | ICD-10-CM

## 2022-03-07 DIAGNOSIS — R948 Abnormal results of function studies of other organs and systems: Secondary | ICD-10-CM

## 2022-03-07 DIAGNOSIS — K639 Disease of intestine, unspecified: Secondary | ICD-10-CM

## 2022-03-07 MED ORDER — ALBUTEROL SULFATE (2.5 MG/3ML) 0.083% IN NEBU: 2.5000 mg | INHALATION_SOLUTION | RESPIRATORY_TRACT | 12 refills | Status: DC | PRN

## 2022-03-07 MED ORDER — PREDNISONE 10 MG PO TABS: ORAL_TABLET | ORAL | 2 refills | Status: DC

## 2022-03-07 NOTE — Progress Notes (Signed)
[name], female    DOB: 07/30/1956,    MRN: [id number]   Brief patient profile:  86 yowf    quit smoking 2019 p started albuterol  inhalers around 2014 then added symbicort changed to wixella  due to cost referred to pulmonary clinic 06/29/2021 by Dr Ethlyn Gallery  who recently changed to breztri which seemed to helped even more.   PFTs 12/14/19 c/w GOLD 3 copd   04/12/21 oncology note DIAGNOSIS:  1) Stage Ib (T2, N0, M0) non-small cell lung cancer, adenocarcinoma diagnosed in June 2019. 2) stage Ia (T1 a, N0, M0) non-small cell lung cancer, adenocarcinoma status post   PRIOR THERAPY:  1) status post wedge resection of the left upper lobe nodule in addition to lingular segmentectomy and mediastinal lymph node sampling under the care of Dr. Roxan Hockey on March 13, 2018.  Right lower lobe segmentectomy under the care of Dr. Roxan Hockey on December 18, 2019.   CURRENT THERAPY: Observation  History of Present Illness  06/29/2021  Pulmonary/ 1st office eval/Stacee Earp  Chief Complaint  Patient presents with   Pulmonary Consult     Referred by Dr. Gwyneth Revels. Pt has hx of lung ca and she was told back in 2018 she has COPD. She gets winded just walking around her house. She had PNA approx 2 months.    Dyspnea:  one room to the next / shops on line  Cough: prednisone helped still some residual dry cough  Sleep: on side / electric bed 30 degrees  SABA use: one bid hfa/ neb once daily  Rec Plan A = Automatic = Always=    wixella one twice daily  = trelegy 100 once daily  or Breztri 2 puff every 12 hours  Breathe out thru nose  Plan B = Backup (to supplement plan A, not to replace it) Only use your albuterol inhaler as a rescue medication  Plan C = Crisis (instead of Plan B but only if Plan B stops working) - only use your albuterol nebulizer if you first try Plan B and it fails to help Call we the cheapest alternatives  for example wixellla / incruse   = trelegy       09/07/2021  f/u  ov/Khalin Royce re: GOLD 3 COPD   maint on breztri 2bid  / pred 40 taper / mucinex/ no abx Chief Complaint  Patient presents with   Follow-up    Pt states concerns for COPD exacerbation  Dyspnea:  worse since end of Oct  2022  Cough flutter valve helps > mucus clear now  Sleeping: better now  SABA use: one duoneb at 5 h prior to OV   02: none  Covid status:   vax x 2  Rec Plan A = Automatic = Always=    symbicort 160 instead of breztri @ Take 2 puffs first thing in am and then another 2 puffs about 12 hours later.   Plan B = Backup (to supplement plan A, not to replace it) Only use your albuterol (PROAIR) inhaler as a rescue medication Plan C = Crisis (instead of Plan B but only if Plan B stops working) - only use your albuterol/atrovent  nebulizer if you first try Plan B and it fails to help  Plan D = Doctor - call me if B and C not adequate    Use the lowest possible dose of Inderal      03/07/2022  f/u ov/Charmane Protzman re: GOLD 3   maint on breztri and pred 10  mg daily   Chief Complaint  Patient presents with   Follow-up    Pt states she is still having problems with her breathing since last visit. States some inflammation was found in lower left lung on a PET scan.   Dyspnea:  was doing pulmonary rehab but could not keep it up but still better than baseline  Mainly walking in hourse  Cough: minimal / some worse with talking  Sleeping: on side up 6 in SABA use: hfa avg once a day / neb q 3-4 days  02: 2lpm Covid status:   vax x 2/ infected x one dec 2022    No obvious day to day or daytime variability or assoc excess/ purulent sputum or mucus plugs or hemoptysis or cp or chest tightness, subjective wheeze or overt sinus or hb symptoms.   Sleeping as above  without nocturnal  or early am exacerbation  of respiratory  c/o's or need for noct saba. Also denies any obvious fluctuation of symptoms with weather or environmental changes or other aggravating or alleviating factors except as outlined  above   No unusual exposure hx or h/o childhood pna/ asthma or knowledge of premature birth.  Current Allergies, Complete Past Medical History, Past Surgical History, Family History, and Social History were reviewed in [employer] record.  ROS  The following are not active complaints unless bolded Hoarseness, sore throat, dysphagia, dental problems, itching, sneezing,  nasal congestion or discharge of excess mucus or purulent secretions, ear ache,   fever, chills, sweats, unintended wt loss or wt gain, classically pleuritic or exertional cp,  orthopnea pnd or arm/hand swelling  or leg swelling, presyncope, palpitations, abdominal pain, anorexia, nausea, vomiting, diarrhea  or change in bowel habits or change in bladder habits, change in stools or change in urine, dysuria, hematuria,  rash, arthralgias, visual complaints, headache, numbness, weakness or ataxia or problems with walking or coordination,  change in mood or  memory.        Current Meds - - NOTE:   Unable to verify as accurately reflecting what pt takes    Medication Sig   albuterol (VENTOLIN HFA) 108 (90 Base) MCG/ACT inhaler Inhale 2 puffs into the lungs every 6 (six) hours as needed for wheezing or shortness of breath.   azelastine (ASTELIN) 0.1 % nasal spray Place 2 sprays into both nostrils 2 (two) times daily. Use in each nostril as directed   fluticasone (FLONASE) 50 MCG/ACT nasal spray Place 1 spray into both nostrils daily.   guaiFENesin (MUCINEX) 600 MG 12 hr tablet Take 600 mg by mouth 2 (two) times daily as needed for cough or to loosen phlegm.   ipratropium (ATROVENT) 0.02 % nebulizer solution Take 0.5 mg by nebulization as needed for wheezing or shortness of breath.   loratadine (CLARITIN) 10 MG tablet Take 1 tablet (10 mg total) by mouth daily.   Melatonin 10 MG TABS Take 40 mg by mouth at bedtime.   montelukast (SINGULAIR) 10 MG tablet Take by mouth.   omeprazole (PRILOSEC) 20 MG capsule Take 20  mg by mouth daily.   pantoprazole (PROTONIX) 40 MG tablet Take 1 tablet (40 mg total) by mouth daily.   PARoxetine (PAXIL) 40 MG tablet Take 1 tablet (40 mg total) by mouth daily.   predniSONE (DELTASONE) 10 MG tablet Take 40mg  x5 days then 10mg  daily.   propranolol ER (INDERAL LA) 60 MG 24 hr capsule Take 1 capsule (60 mg total) by mouth daily.   sucralfate (  CARAFATE) 1 g tablet Take 1 tablet (1 g total) by mouth 4 (four) times daily -  with meals and at bedtime.   traZODone (DESYREL) 100 MG tablet Take 1 tablet (100 mg total) by mouth at bedtime as needed for sleep.            Past Medical History:  Diagnosis Date   Anxiety    Arthritis of hand 04/17/2017   Bronchitis    Depression    Dyspnea    Emphysema, unspecified ([place])    per patient mild case   Family history of adverse reaction to anesthesia    patient states sister has a hard time waking up from anesthesia.    Hepatitis C    lung ca dx'd 03/2018   Tremor, unspecified    non-specific; takes inderal.          Objective:     Wts    03/07/2022        156   09/07/21 125 lb (56.7 kg)  06/29/21 120 lb (54.4 kg)  06/07/21 119 lb 11.2 oz (54.3 kg)    Vital signs reviewed  03/07/2022  - Note at rest 02 sats  97% on 2lpm pulsed   General appearance:    amb chronically ill appearing wm     HEENT : Oropharynx  ckear   Nasal turbinates n    NECK :  without  apparent JVD/ palpable Nodes/TM    LUNGS: no acc muscle use,  Min barrel  contour chest wall with bilateral  slightly decreased bs s audible wheeze and  without cough on insp or exp maneuvers and min  Hyperresonant  to  percussion bilaterally    CV:  RRR  no s3 or murmur or increase in P2, and no edema   ABD:  soft and nontender with pos end  insp Hoover's  in the supine position.  No bruits or organomegaly appreciated   MS:  Nl gait/ ext warm without deformities Or obvious joint restrictions  calf tenderness, cyanosis or clubbing     SKIN: warm and dry without  lesions    NEURO:  alert, approp, nl sensorium with  no motor or cerebellar deficits apparent.            I personally reviewed images and agree with radiology impression as follows:   Chest CT w contrast 02/16/22 The central airways are patent. Moderate centrilobular emphysematous changes are similar to prior. Resolution of the prior posterior right upper lobe ground-glass and heterogeneous airspace opacities.   Assessment

## 2022-03-07 NOTE — Patient Instructions (Addendum)
Work on inhaler technique:  relax and gently blow all the way out then take a nice smooth full deep breath back in, triggering the inhaler at same time you start breathing in.  Hold for up to 5 seconds if you can. Blow out thru nose. Rinse and gargle with water when done.  If mouth or throat bother you at all,  try brushing teeth/gums/tongue with arm and hammer toothpaste/ make a slurry and gargle and spit out.   - remember how golfers warm up      Prednsione for now is 2 until better and then 1 daily    Please schedule a follow up visit in 3 months but call sooner if needed

## 2022-03-07 NOTE — Telephone Encounter (Signed)
Spoke to patient and she informed me of results of recent PET scan   "Abnormal appearance of the cecum. Possible cystic change with medial calcifications. The terminal ileum appears normal. Possible this could represent some type of appendiceal process such as a mucocele. I do not see any definite hypermetabolism. Recommend dedicated CT abdomen and pelvis with IV and oral contrast for further evaluation"

## 2022-03-08 ENCOUNTER — Telehealth: Payer: Self-pay | Admitting: Internal Medicine

## 2022-03-08 ENCOUNTER — Encounter: Payer: Self-pay | Admitting: Internal Medicine

## 2022-03-08 ENCOUNTER — Encounter (HOSPITAL_COMMUNITY): Payer: Medicare Other

## 2022-03-08 MED ORDER — BREZTRI AEROSPHERE 160-9-4.8 MCG/ACT IN AERO: 2.0000 | INHALATION_SPRAY | Freq: Two times a day (BID) | RESPIRATORY_TRACT | 11 refills | Status: DC

## 2022-03-08 NOTE — Assessment & Plan Note (Signed)
Quit smoking 2019 - PFT's  12/14/19 FEV1 1.02 (40 % ) ratio 0.39  p 20 % improvement from saba p ? prior to study with DLCO  10.06 (48%) corrects to 2.32 (55%)  for alv volume and FV curve classic concave exp loop - 06/29/2021  After extensive coaching inhaler device,  effectiveness =    90% with dpi, 75% with hfa   - 06/29/2021   Walked on RA x  3  lap(s) =  approx 750 ft @ mod fast pace, stopped due to end of study, sob  with lowest 02 sats 93%   -  03/07/2022  After extensive coaching inhaler device,  effectiveness =    60% > continue breztri 2 bid plus pred 20 mg until better then 10 mg daily and f/u in 4-6  Weeks   Group D (now reclassified as E) in terms of symptom/risk and laba/lama/ICS  therefore appropriate rx at this point >>>  breztri plus approp saba     Between the inderol dep and ? BOOP like changes on CT/PET has become steroid dep.  The goal with a chronic steroid dependent illness is always arriving at the lowest effective dose that controls the disease/symptoms and not accepting a set "formula" which is based on statistics or guidelines that don't always take into account patient  variability or the natural hx of the dz in every individual patient, which may well vary over time.  For now therefore I recommend the patient maintain  20 ceiling/ 10 mg floor for now and f/u in 4- 6 weeks

## 2022-03-08 NOTE — Telephone Encounter (Signed)
Spoke with the pt  She is taking Breztri  She gets this through the pt assistance program for free  I scheduled 6 wk rov and advised bring all meds

## 2022-03-08 NOTE — Telephone Encounter (Signed)
She needs to be seen in 4-6 weeks to f/u pred dose and must bring all meds  She did not list breztri at ov so be sure she has it - I wrote new rx today to be sure

## 2022-04-06 ENCOUNTER — Other Ambulatory Visit: Payer: Self-pay

## 2022-04-06 ENCOUNTER — Inpatient Hospital Stay: Payer: Medicare Other | Attending: Internal Medicine

## 2022-04-06 DIAGNOSIS — Z885 Allergy status to narcotic agent status: Secondary | ICD-10-CM | POA: Insufficient documentation

## 2022-04-06 DIAGNOSIS — Z9049 Acquired absence of other specified parts of digestive tract: Secondary | ICD-10-CM | POA: Insufficient documentation

## 2022-04-06 DIAGNOSIS — Z7952 Long term (current) use of systemic steroids: Secondary | ICD-10-CM | POA: Diagnosis not present

## 2022-04-06 DIAGNOSIS — Z886 Allergy status to analgesic agent status: Secondary | ICD-10-CM | POA: Diagnosis not present

## 2022-04-06 DIAGNOSIS — R058 Other specified cough: Secondary | ICD-10-CM | POA: Insufficient documentation

## 2022-04-06 DIAGNOSIS — Z882 Allergy status to sulfonamides status: Secondary | ICD-10-CM | POA: Insufficient documentation

## 2022-04-06 DIAGNOSIS — R5383 Other fatigue: Secondary | ICD-10-CM | POA: Insufficient documentation

## 2022-04-06 DIAGNOSIS — J439 Emphysema, unspecified: Secondary | ICD-10-CM | POA: Diagnosis not present

## 2022-04-06 DIAGNOSIS — R0609 Other forms of dyspnea: Secondary | ICD-10-CM | POA: Insufficient documentation

## 2022-04-06 DIAGNOSIS — C3412 Malignant neoplasm of upper lobe, left bronchus or lung: Secondary | ICD-10-CM | POA: Insufficient documentation

## 2022-04-06 DIAGNOSIS — Z880 Allergy status to penicillin: Secondary | ICD-10-CM | POA: Insufficient documentation

## 2022-04-06 DIAGNOSIS — Z79899 Other long term (current) drug therapy: Secondary | ICD-10-CM | POA: Insufficient documentation

## 2022-04-06 DIAGNOSIS — C3490 Malignant neoplasm of unspecified part of unspecified bronchus or lung: Secondary | ICD-10-CM

## 2022-04-06 LAB — CBC WITH DIFFERENTIAL (CANCER CENTER ONLY)
Abs Immature Granulocytes: 0.04 10*3/uL (ref 0.00–0.07)
Basophils Absolute: 0.1 10*3/uL (ref 0.0–0.1)
Basophils Relative: 1 %
Eosinophils Absolute: 0.2 10*3/uL (ref 0.0–0.5)
Eosinophils Relative: 3 %
HCT: 40.7 % (ref 36.0–46.0)
Hemoglobin: 13.6 g/dL (ref 12.0–15.0)
Immature Granulocytes: 1 %
Lymphocytes Relative: 24 %
Lymphs Abs: 1.4 10*3/uL (ref 0.7–4.0)
MCH: 28.9 pg (ref 26.0–34.0)
MCHC: 33.4 g/dL (ref 30.0–36.0)
MCV: 86.4 fL (ref 80.0–100.0)
Monocytes Absolute: 0.6 10*3/uL (ref 0.1–1.0)
Monocytes Relative: 10 %
Neutro Abs: 3.6 10*3/uL (ref 1.7–7.7)
Neutrophils Relative %: 61 %
Platelet Count: 291 10*3/uL (ref 150–400)
RBC: 4.71 MIL/uL (ref 3.87–5.11)
RDW: 13.2 % (ref 11.5–15.5)
WBC Count: 5.9 10*3/uL (ref 4.0–10.5)
nRBC: 0 % (ref 0.0–0.2)

## 2022-04-06 LAB — CMP (CANCER CENTER ONLY)
ALT: 17 U/L (ref 0–44)
AST: 20 U/L (ref 15–41)
Albumin: 4.4 g/dL (ref 3.5–5.0)
Alkaline Phosphatase: 87 U/L (ref 38–126)
Anion gap: 9 (ref 5–15)
BUN: 13 mg/dL (ref 8–23)
CO2: 27 mmol/L (ref 22–32)
Calcium: 9.8 mg/dL (ref 8.9–10.3)
Chloride: 102 mmol/L (ref 98–111)
Creatinine: 1.07 mg/dL — ABNORMAL HIGH (ref 0.44–1.00)
GFR, Estimated: 57 mL/min — ABNORMAL LOW (ref >60)
Glucose, Bld: 109 mg/dL — ABNORMAL HIGH (ref 70–99)
Potassium: 4 mmol/L (ref 3.5–5.1)
Sodium: 138 mmol/L (ref 135–145)
Total Bilirubin: 0.8 mg/dL (ref 0.3–1.2)
Total Protein: 7.7 g/dL (ref 6.5–8.1)

## 2022-04-09 ENCOUNTER — Inpatient Hospital Stay (HOSPITAL_BASED_OUTPATIENT_CLINIC_OR_DEPARTMENT_OTHER): Payer: Medicare Other | Admitting: Internal Medicine

## 2022-04-09 ENCOUNTER — Inpatient Hospital Stay: Payer: Medicare Other | Admitting: Internal Medicine

## 2022-04-09 DIAGNOSIS — C3490 Malignant neoplasm of unspecified part of unspecified bronchus or lung: Secondary | ICD-10-CM | POA: Diagnosis not present

## 2022-04-09 NOTE — Progress Notes (Signed)
[place] [phone]   [phone]  PROGRESS NOTE FOR TELEMEDICINE VISITS  [name], NP [place] [address]  I connected withNAME@ on 04/09/22 at 10:00 AM EDT by telephone visit and verified that I am speaking with the correct person using two identifiers.   I discussed the limitations, risks, security and privacy concerns of performing an evaluation and management service by telemedicine and the availability of in-person appointments. I also discussed with the patient that there may be a patient responsible charge related to this service. The patient expressed understanding and agreed to proceed.  Other persons participating in the visit and their role in the encounter:  None  Patient's location:  Home Provider's location: [place]  DIAGNOSIS:  1) Stage Ib (T2, N0, M0) non-small cell lung cancer, adenocarcinoma diagnosed in June 2019. 2) stage Ia (T1 a, N0, M0) non-small cell lung cancer, adenocarcinoma status post   PRIOR THERAPY:  1) status post wedge resection of the left upper lobe nodule in addition to lingular segmentectomy and mediastinal lymph node sampling under the care of Dr. Roxan Hockey on March 13, 2018.  Right lower lobe segmentectomy under the care of Dr. Roxan Hockey on December 18, 2019.   CURRENT THERAPY: Observation.  INTERVAL HISTORY: [name] 66 y.o. female has a telephone virtual visit with me today for evaluation and discussion of her recent imaging studies.  The patient is feeling fine today with no concerning complaints except for shortness of breath that has been going on for several weeks with some productive cough.  She was seen by Dr. Melvyn Novas and had several studies performed recently including a PET scan on March 03, 2022.  The patient denied having any current chest pain or hemoptysis.  She has no nausea, vomiting, diarrhea or constipation.  She has no headache or visual  changes.  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    Hepatitis C    lung ca dx'd 03/2018   Tremor, unspecified    non-specific; takes inderal.     ALLERGIES:  is allergic to aspirin, trelegy ellipta [fluticasone-umeclidin-vilant], morphine sulfate, oxycodone, glycopyrrolate, lactose intolerance (gi), penicillins, and sulfa antibiotics.  MEDICATIONS:  Current Outpatient Medications  Medication Sig Dispense Refill   albuterol (PROVENTIL) (2.5 MG/3ML) 0.083% nebulizer solution Take 3 mLs (2.5 mg total) by nebulization every 4 (four) hours as needed for wheezing or shortness of breath. 75 mL 12   albuterol (VENTOLIN HFA) 108 (90 Base) MCG/ACT inhaler Inhale 2 puffs into the lungs every 6 (six) hours as needed for wheezing or shortness of breath. 8 g 0   azelastine (ASTELIN) 0.1 % nasal spray Place 2 sprays into both nostrils 2 (two) times daily. Use in each nostril as directed 30 mL 6   Budeson-Glycopyrrol-Formoterol (BREZTRI AEROSPHERE) 160-9-4.8 MCG/ACT AERO Inhale 2 puffs into the lungs 2 (two) times daily. 10.7 g 11   fluticasone (FLONASE) 50 MCG/ACT nasal spray Place 1 spray into both nostrils daily. 18.2 mL 2   guaiFENesin (MUCINEX) 600 MG 12 hr tablet Take 600 mg by mouth 2 (two) times daily as needed for cough or to loosen phlegm.  loratadine (CLARITIN) 10 MG tablet Take 1 tablet (10 mg total) by mouth daily. 30 tablet 3   Melatonin 10 MG TABS Take 40 mg by mouth at bedtime.     montelukast (SINGULAIR) 10 MG tablet Take by mouth.     pantoprazole (PROTONIX) 40 MG tablet Take 1 tablet (40 mg total) by mouth daily. 90 tablet 0   PARoxetine (PAXIL) 40 MG tablet Take 1 tablet (40 mg total) by mouth daily. 90 tablet  1   predniSONE (DELTASONE) 10 MG tablet 2 until better then 1 daily 100 tablet 2   propranolol ER (INDERAL LA) 60 MG 24 hr capsule Take 1 capsule (60 mg total) by mouth daily. 90 capsule 1   sucralfate (CARAFATE) 1 g tablet Take 1 tablet (1 g total) by mouth 4 (four) times daily -  with meals and at bedtime. 120 tablet 0   traZODone (DESYREL) 100 MG tablet Take 1 tablet (100 mg total) by mouth at bedtime as needed for sleep. 90 tablet 1   No current facility-administered medications for this visit.    SURGICAL HISTORY:  Past Surgical History:  Procedure Laterality Date   ABDOMINAL HYSTERECTOMY  1986   menorrhagia; hx of cryo for abnormal paps   APPENDECTOMY     removed in 1986 per pt   INTERCOSTAL NERVE BLOCK Right 12/18/2019   Procedure: Intercostal Nerve Block;  Surgeon: Melrose Nakayama, MD;  Location: [facility];  Service: Thoracic;  Laterality: Right;   LOBECTOMY Left 03/13/2018   Procedure: Lingula section of left upper lobe lobectomy;  Surgeon: Melrose Nakayama, MD;  Location: [facility];  Service: Thoracic;  Laterality: Left;   NODE DISSECTION  12/18/2019   Procedure: Node Dissection;  Surgeon: Melrose Nakayama, MD;  Location: [facility];  Service: Thoracic;;   VIDEO ASSISTED THORACOSCOPY (VATS)/WEDGE RESECTION Left 03/13/2018   Procedure: VIDEO ASSISTED THORACOSCOPY (VATS)/WEDGE RESECTION;  Surgeon: Melrose Nakayama, MD;  Location: [facility];  Service: Thoracic;  Laterality: Left;    REVIEW OF SYSTEMS:  A comprehensive review of systems was negative except for: Constitutional: positive for fatigue Respiratory: positive for cough and dyspnea on exertion    LABORATORY DATA: Lab Results  Component Value Date   WBC 5.9 04/06/2022   HGB 13.6 04/06/2022   HCT 40.7 04/06/2022   MCV 86.4 04/06/2022   PLT 291 04/06/2022      Chemistry      Component Value Date/Time   NA 138 04/06/2022 0954   K 4.0 04/06/2022 0954   CL 102 04/06/2022 0954   CO2 27 04/06/2022 0954   BUN 13  04/06/2022 0954   CREATININE 1.07 (H) 04/06/2022 0954   CREATININE 0.73 10/27/2021 1543      Component Value Date/Time   CALCIUM 9.8 04/06/2022 0954   ALKPHOS 87 04/06/2022 0954   AST 20 04/06/2022 0954   ALT 17 04/06/2022 0954   ALT 20 10/10/2017 1653   BILITOT 0.8 04/06/2022 0954       RADIOGRAPHIC STUDIES: No results found.  ASSESSMENT AND PLAN: This is a very pleasant 66 years old white female with stage Ib (T2, N0, M0) non-small cell lung cancer, adenocarcinoma diagnosed in June 2019 as well as stage Ia (T1 a, N0, M0) non-small cell lung cancer, adenocarcinoma diagnosed in March 2021 status post wedge resection of the left upper lobe nodule with lingular segmentectomy and mediastinal lymph node sampling on March 13, 2018 as well as right lower lobe segmentectomy by Dr. Roxan Hockey on December 18, 2019. The patient has been on observation since that time.  She has been complaining of increasing cough and shortness of breath recently.  She had imaging studies including CT scan of the chest with contrast on 02/16/2022 followed by a PET scan on 03/02/2022.  Her PET scan showed stable bilateral lung postobstructive changes with resolution of the prior posterior right upper lobe heterogeneous airspace opacification that was seen on the September 18, 2021 scan.  There was new nodularity around the pulmonary vasculature within the medial right lower lobe measuring up to 1.5 cm and soft tissue nodularity of malignancy could not be completely excluded and close follow-up is recommended. I recommended for the patient to continue on observation with repeat CT scan of the chest in 3 months for confirmation of resolution of these changes. The patient was also advised to call immediately if she has any concerning symptoms in the interval. I discussed the assessment and treatment plan with the patient. The patient was provided an opportunity to ask questions and all were answered. The patient agreed with the plan  and demonstrated an understanding of the instructions.   The patient was advised to call back or seek an in-person evaluation if the symptoms worsen or if the condition fails to improve as anticipated.  I provided 15 minutes of non face-to-face telephone visit time during this encounter, and > 50% was spent counseling as documented under my assessment & plan.  Eilleen Kempf, MD 04/09/2022 11:48 AM  Disclaimer: This note was dictated with voice recognition software. Similar sounding words can inadvertently be transcribed and may not be corrected upon review.

## 2022-04-23 ENCOUNTER — Encounter: Payer: Self-pay | Admitting: Internal Medicine

## 2022-04-23 ENCOUNTER — Ambulatory Visit (INDEPENDENT_AMBULATORY_CARE_PROVIDER_SITE_OTHER): Payer: Medicare Other | Admitting: Internal Medicine

## 2022-04-23 DIAGNOSIS — C3492 Malignant neoplasm of unspecified part of left bronchus or lung: Secondary | ICD-10-CM | POA: Diagnosis not present

## 2022-04-23 DIAGNOSIS — J449 Chronic obstructive pulmonary disease, unspecified: Secondary | ICD-10-CM | POA: Diagnosis not present

## 2022-04-23 DIAGNOSIS — J9611 Chronic respiratory failure with hypoxia: Secondary | ICD-10-CM | POA: Diagnosis not present

## 2022-04-23 DIAGNOSIS — R251 Tremor, unspecified: Secondary | ICD-10-CM

## 2022-04-23 MED ORDER — BISOPROLOL FUMARATE 5 MG PO TABS: 5.0000 mg | ORAL_TABLET | Freq: Every day | ORAL | 11 refills | Status: DC

## 2022-04-23 NOTE — Progress Notes (Unsigned)
[name], female    DOB: May 29, 1956,    MRN: [id number]   Brief patient profile:  31 yowf    quit smoking 2019 p started albuterol  inhalers around 2014 then added symbicort changed to wixella  due to cost referred to pulmonary clinic 06/29/2021 by Dr Ethlyn Gallery  who recently changed to breztri which seemed to helped even more.   PFTs 12/14/19 c/w GOLD 3 copd   04/12/21 oncology note DIAGNOSIS:  1) Stage Ib (T2, N0, M0) non-small cell lung cancer, adenocarcinoma diagnosed in June 2019. 2) stage Ia (T1 a, N0, M0) non-small cell lung cancer, adenocarcinoma status post   PRIOR THERAPY:  1) status post wedge resection of the left upper lobe nodule in addition to lingular segmentectomy and mediastinal lymph node sampling under the care of Dr. Roxan Hockey on March 13, 2018.  Right lower lobe segmentectomy under the care of Dr. Roxan Hockey on December 18, 2019.   CURRENT THERAPY: Observation  History of Present Illness  06/29/2021  Pulmonary/ 1st office eval/[name]  Chief Complaint  Patient presents with   Pulmonary Consult     Referred by Dr. Gwyneth Revels. Pt has hx of lung ca and she was told back in 2018 she has COPD. She gets winded just walking around her house. She had PNA approx 2 months.    Dyspnea:  one room to the next / shops on line  Cough: prednisone helped still some residual dry cough  Sleep: on side / electric bed 30 degrees  SABA use: one bid hfa/ neb once daily  Rec Plan A = Automatic = Always=    wixella one twice daily  = trelegy 100 once daily  or Breztri 2 puff every 12 hours  Breathe out thru nose  Plan B = Backup (to supplement plan A, not to replace it) Only use your albuterol inhaler as a rescue medication  Plan C = Crisis (instead of Plan B but only if Plan B stops working) - only use your albuterol nebulizer if you first try Plan B and it fails to help Call we the cheapest alternatives  for example wixellla / incruse   = trelegy       09/07/2021  f/u  ov/Evaline Waltman re: GOLD 3 COPD   maint on breztri 2bid  / pred 40 taper / mucinex/ no abx Chief Complaint  Patient presents with   Follow-up    Pt states concerns for COPD exacerbation  Dyspnea:  worse since end of Oct  2022  Cough flutter valve helps > mucus clear now  Sleeping: better now  SABA use: one duoneb at 5 h prior to OV   02: none  Covid status:   vax x 2  Rec Plan A = Automatic = Always=    symbicort 160 instead of breztri @ Take 2 puffs first thing in am and then another 2 puffs about 12 hours later.   Plan B = Backup (to supplement plan A, not to replace it) Only use your albuterol (PROAIR) inhaler as a rescue medication Plan C = Crisis (instead of Plan B but only if Plan B stops working) - only use your albuterol/atrovent  nebulizer if you first try Plan B and it fails to help  Plan D = Doctor - call me if B and C not adequate    Use the lowest possible dose of Inderal      03/07/2022  f/u ov/[name] re: GOLD 3   maint on breztri and pred 10  mg daily   Chief Complaint  Patient presents with   Follow-up    Pt states she is still having problems with her breathing since last visit. States some inflammation was found in lower left lung on a PET scan.   Dyspnea:  was doing pulmonary rehab but could not keep it up but still better than baseline  Mainly walking in house  Cough: minimal / some worse with talking  Sleeping: on side up 6 in SABA use: hfa avg once a day / neb q 3-4 days  02: 2lpm Covid status:   vax x 2/ infected x one dec 2022  Rec Work on inhaler technique:   - remember how golfers warm up  [place] for now is 2 until better and then 1 daily   Please schedule a follow up visit in 3 months but call sooner if needed    04/23/2022  f/u ov/Kyna Blahnik re: copd on inderol 60  -  maint on breztri last dose 2 weeks prior and no change  off pred Chief Complaint  Patient presents with   Follow-up    Pt states she has no new issues and she finished the prednisone but no  refill.  Dyspnea:  room to room  at home no better on prednisone  Cough: min mucoid no pattern Sleeping: 6 in blocks one pillow and on L side ok  SABA use: rarely  02: 2lpm POC  and sats always 96% or above  Covid status:   x 2 vax    No obvious day to day or daytime variability or assoc excess/ purulent sputum or mucus plugs or hemoptysis or cp or chest tightness, subjective wheeze or overt sinus or hb symptoms.   Sleeping  without nocturnal  or early am exacerbation  of respiratory  c/o's or need for noct saba. Also denies any obvious fluctuation of symptoms with weather or environmental changes or other aggravating or alleviating factors except as outlined above   No unusual exposure hx or h/o childhood pna/ asthma or knowledge of premature birth.  Current Allergies, Complete Past Medical History, Past Surgical History, Family History, and Social History were reviewed in [employer] record.  ROS  The following are not active complaints unless bolded Hoarseness, sore throat, dysphagia, dental problems, itching, sneezing,  nasal congestion or discharge of excess mucus or purulent secretions, ear ache,   fever, chills, sweats, unintended wt loss or wt gain, classically pleuritic or exertional cp,  orthopnea pnd or arm/hand swelling  or leg swelling, presyncope, palpitations, abdominal pain, anorexia, nausea, vomiting, diarrhea  or change in bowel habits or change in bladder habits, change in stools or change in urine, dysuria, hematuria,  rash, arthralgias, visual complaints, headache, numbness, weakness or ataxia or problems with walking or coordination,  change in mood or  memory.        Current Meds  Medication Sig   albuterol (PROVENTIL) (2.5 MG/3ML) 0.083% nebulizer solution Take 3 mLs (2.5 mg total) by nebulization every 4 (four) hours as needed for wheezing or shortness of breath.   albuterol (VENTOLIN HFA) 108 (90 Base) MCG/ACT inhaler Inhale 2 puffs into the  lungs every 6 (six) hours as needed for wheezing or shortness of breath.   Budeson-Glycopyrrol-Formoterol (BREZTRI AEROSPHERE) 160-9-4.8 MCG/ACT AERO Inhale 2 puffs into the lungs 2 (two) times daily.   fluticasone (FLONASE) 50 MCG/ACT nasal spray Place 1 spray into both nostrils daily.   guaiFENesin (MUCINEX) 600 MG 12 hr tablet Take 600 mg  by mouth 2 (two) times daily as needed for cough or to loosen phlegm.   loratadine (CLARITIN) 10 MG tablet Take 1 tablet (10 mg total) by mouth daily.   Melatonin 10 MG TABS Take 40 mg by mouth at bedtime.   pantoprazole (PROTONIX) 40 MG tablet Take 1 tablet (40 mg total) by mouth daily.   PARoxetine (PAXIL) 40 MG tablet Take 1 tablet (40 mg total) by mouth daily.   propranolol ER (INDERAL LA) 60 MG 24 hr capsule Take 1 capsule (60 mg total) by mouth daily.   traZODone (DESYREL) 100 MG tablet Take 1 tablet (100 mg total) by mouth at bedtime as needed for sleep.            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    Hepatitis C    lung ca dx'd 03/2018   Tremor, unspecified    non-specific; takes inderal.          Objective:     Wts  04/23/2022       156   03/07/2022        156   09/07/21 125 lb (56.7 kg)  06/29/21 120 lb (54.4 kg)  06/07/21 119 lb 11.2 oz (54.3 kg)    Vital signs reviewed  04/23/2022  - Note at rest 02 sats  96% on RA   General appearance:    amb chronically ill appearing wf nad    HEENT : Oropharynx  clear   Nasal turbinates nl    NECK :  without  apparent JVD/ palpable Nodes/TM    LUNGS: no acc muscle use,  Min barrel  contour chest wall with bilateral  slightly decreased bs s audible wheeze and  without cough on insp or exp maneuvers and min  Hyperresonant  to   percussion bilaterally    CV:  RRR  no s3 or murmur or increase in P2, and no edema   ABD:  soft and nontender with pos end  insp Hoover's  in the supine position.  No bruits or organomegaly appreciated   MS:  Nl gait/ ext warm without deformities Or obvious joint restrictions  calf tenderness, cyanosis or clubbing     SKIN: warm and dry without lesions    NEURO:  alert, approp, nl sensorium with  no motor or cerebellar deficits apparent.         I personally reviewed images and agree with radiology impression as follows:   Chest CT w contrast  02/16/22  1. Stable bilateral lung postoperative changes. 2. Resolution of the prior posterior right upper lobe heterogeneous airspace opacification seen on 09/18/2021 CT. 3. New nodularity around pulmonary vasculature within the medial left lower lobe measuring up to 15 mm in craniocaudal dimension. Cannot exclude soft tissue nodularity of malignancy. Consider follow-up CT in 3 months to assess for interval change.            Assessment

## 2022-04-23 NOTE — Patient Instructions (Addendum)
Try propranolol and on  bisoprolol 5 mg one daily (take one half daily if too strong)   Ok to try albuterol 15 min before an activity (on alternating days between inhaler/ nebulizer or nothing )  that you know would usually make you short of breath and see if it makes any difference and if makes none then don't take albuterol after activity unless you can't catch your breath as this means it's the resting that helps, not the albuterol.   Make sure you check your oxygen saturation  AT  your highest level of activity (not after you stop)   to be sure it stays over 90% and adjust  02 flow upward to maintain this level if needed but remember to turn it back to previous settings when you stop (to conserve your supply).    We may need clonazepam to help reduce your air hunger   Please schedule a follow up visit in 3 months but call sooner if needed  Add needs alpha one phenotype on return

## 2022-04-24 ENCOUNTER — Encounter: Payer: Self-pay | Admitting: Internal Medicine

## 2022-04-24 NOTE — Assessment & Plan Note (Signed)
Adequate control on present rx, reviewed in detail with pt > no change in rx needed  = 2lpm   Advised  Make sure you check your oxygen saturation  AT  your highest level of activity (not after you stop)   to be sure it stays over 90% and adjust  02 flow upward to maintain this level if needed but remember to turn it back to previous settings when you stop (to conserve your supply).

## 2022-04-24 NOTE — Assessment & Plan Note (Signed)
D/c'd propranolol 04/23/2022 > bisoprolol 5 mg daily if tolerates (can reduce by half if too strong)

## 2022-04-24 NOTE — Assessment & Plan Note (Addendum)
Quit smoking 2019 - PFT's  12/14/19 FEV1 1.02 (40 % ) ratio 0.39  p 20 % improvement from saba p ? prior to study with DLCO  10.06 (48%) corrects to 2.32 (55%)  for alv volume and FV curve classic concave exp loop - 06/29/2021  After extensive coaching inhaler device,  effectiveness =    90% with dpi, 75% with hfa   - 06/29/2021   Walked on RA x  3  lap(s) =  approx 750 ft @ mod fast pace, stopped due to end of study, sob  with lowest 02 sats 93%   -  03/07/2022  After extensive coaching inhaler device,  effectiveness =    60% > continue breztri 2 bid plus pred 20 mg until better then 10 mg daily and f/u in 4 weeks - 04/23/2022 rec change propranolol to bisoprolol  04/23/2022  After extensive coaching inhaler device,  effectiveness =    75%  DDX of  difficult airways management almost all start with A and  include Adherence, Ace Inhibitors, Acid Reflux, Active Sinus Disease, Alpha 1 Antitripsin deficiency, Anxiety masquerading as Airways dz,  ABPA,  Allergy(esp in young), Aspiration (esp in elderly), Adverse effects of meds,  Active smoking or vaping, A bunch of PE's (a small clot burden can't cause this syndrome unless there is already severe underlying pulm or vascular dz with poor reserve) plus two Bs  = Bronchiectasis and Beta blocker use..and one C= CHF  Adherence is always the initial "prime suspect" and is a multilayered concern that requires a "trust but verify" approach in every patient - starting with knowing how to use medications, especially inhalers, correctly, keeping up with refills and understanding the fundamental difference between maintenance and prns vs those medications only taken for a very short course and then stopped and not refilled.  - see hfa training above  ? Acid (or non-acid) GERD > always difficult to exclude as up to 75% of pts in some series report no assoc GI/ Heartburn symptoms> rec continue max (24h)  acid suppression and diet restrictions/ reviewed     ? Allergy/ asthma  > doubt base on response to pred/ no worse off  ? Alpha one > needs phenotype check next ov  ? BB effects > try off propranol if tolerates

## 2022-04-24 NOTE — Assessment & Plan Note (Addendum)
[facility] eval reviewed from 04/09/22    1) Stage Ib (T2, N0, M0) non-small cell lung cancer, adenocarcinomadiagnosed in June 2019. 2) stage Ia (T1 a, N0, M0) non-small cell lung cancer, adenocarcinoma status post  PRIOR THERAPY:  1) status post wedge resection of the left upper lobe nodule in addition to lingular segmentectomy and mediastinal lymph node sampling under the care of Dr. Roxan Hockey on March 13, 2018. Right lower lobe segmentectomy under the care of Dr. Roxan Hockey on December 18, 2019.  CURRENT THERAPY: Observation  F/u per oncology planned          Each maintenance medication was reviewed in detail including emphasizing most importantly the difference between maintenance and prns and under what circumstances the prns are to be triggered using an action plan format where appropriate.  Total time for H and P, chart review, counseling, reviewing hfa/neb/02 device(s) and generating customized AVS unique to this office visit / same day charting = 37 min

## 2022-04-30 ENCOUNTER — Telehealth: Payer: Self-pay | Admitting: Adult Health

## 2022-05-01 ENCOUNTER — Other Ambulatory Visit: Payer: Self-pay | Admitting: Adult Health

## 2022-05-01 ENCOUNTER — Encounter: Payer: Self-pay | Admitting: Adult Health

## 2022-05-01 ENCOUNTER — Ambulatory Visit (INDEPENDENT_AMBULATORY_CARE_PROVIDER_SITE_OTHER): Payer: Medicare Other | Admitting: Adult Health

## 2022-05-01 VITALS — BP 120/80 | HR 75 | Temp 97.0°F | Ht 64.0 in | Wt 156.0 lb

## 2022-05-01 DIAGNOSIS — Z902 Acquired absence of lung [part of]: Secondary | ICD-10-CM | POA: Diagnosis not present

## 2022-05-01 DIAGNOSIS — C3492 Malignant neoplasm of unspecified part of left bronchus or lung: Secondary | ICD-10-CM

## 2022-05-01 DIAGNOSIS — R0989 Other specified symptoms and signs involving the circulatory and respiratory systems: Secondary | ICD-10-CM | POA: Diagnosis not present

## 2022-05-01 DIAGNOSIS — J449 Chronic obstructive pulmonary disease, unspecified: Secondary | ICD-10-CM | POA: Diagnosis not present

## 2022-05-01 MED ORDER — CLONAZEPAM 0.5 MG PO TABS: 0.5000 mg | ORAL_TABLET | Freq: Three times a day (TID) | ORAL | 1 refills | Status: DC | PRN

## 2022-05-01 NOTE — Telephone Encounter (Signed)
Error/njr °

## 2022-05-01 NOTE — Progress Notes (Signed)
Subjective:    Patient ID: [name], female    DOB: [birth year], 66 y.o.   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  She is followed by pulmonary and oncology due to history of COPD Gold II and lung CA. She is currently maintained on Breztri, albuterol inhaler, and nebulizers. She is off prednisone for the first time in a year and has been able to come off her oxygen therapy.  Last week d/c propranol and started on bisoprolol 5 mg daily ( she has not started this medication yet)  Her biggest concern today is air hunger. She becomes very short of breath with minimal exertion and cannot feel like she gets enough oxygen, despite her o2 being above 94%. This in turn makes her anxious.   She reports that Dr. Melvyn Novas was thinking about prescribing Klonopin for her air hunger but was was advised to follow up with PCP first.     Review of Systems See HPI   Past Medical History:  Diagnosis Date   Anxiety    Arthritis of hand 04/17/2017   Bronchitis    Depression    Dyspnea    Emphysema, unspecified ([place])    per patient mild case   Family history of adverse reaction to anesthesia    patient states sister has a hard time waking up from anesthesia.    Hepatitis C    lung ca dx'd 03/2018   Tremor, unspecified    non-specific; takes inderal.     Social History   Socioeconomic History   Marital status: Widowed    Spouse name: Not on file   Number of children: Not on file   Years of education: Not on file   Highest education level: 12th grade  Occupational History   Not on file  Tobacco Use   Smoking status: Former    Packs/day: 1.00    Years: 40.00    Total pack years: 40.00    Types: Cigarettes    Quit date: 12/14/2017    Years since  quitting: 4.3    Passive exposure: Never   Smokeless tobacco: Never  Vaping Use   Vaping Use: Never used  Substance and Sexual Activity   Alcohol use: Yes    Alcohol/week: 7.0 standard drinks of alcohol    Types: 7 Shots of liquor per week    Comment: occasional bourbon   Drug use: Not Currently   Sexual activity: Never  Other Topics Concern   Not on file  Social History Narrative   Not on file   Social Determinants of Health   Financial Resource Strain: Low Risk  (09/22/2021)   Overall Financial Resource Strain (CARDIA)    Difficulty of Paying Living Expenses: Not hard at all  Food Insecurity: No Food Insecurity (09/22/2021)   Hunger Vital Sign    Worried About Running Out of Food in the Last Year: Never true    Ran Out of Food in the Last Year: Never true  Transportation Needs: No Transportation Needs (09/22/2021)   PRAPARE - Hydrologist (Medical): No    Lack of Transportation (Non-Medical): No  Physical Activity: Inactive (09/22/2021)   Exercise Vital Sign    Days of Exercise per Week: 0 days    Minutes of Exercise  per Session: 0 min  Stress: No Stress Concern Present (09/22/2021)   [place]    Feeling of Stress : Not at all  Social Connections: Moderately Isolated (09/22/2021)   Social Connection and Isolation Panel [NHANES]    Frequency of Communication with Friends and Family: More than three times a week    Frequency of Social Gatherings with Friends and Family: More than three times a week    Attends Religious Services: Never    Marine scientist or Organizations: No    Attends Archivist Meetings: Never    Marital Status: Married  Human resources officer Violence: Not At Risk (09/22/2021)   Humiliation, Afraid, Rape, and Kick questionnaire    Fear of Current or Ex-Partner: No    Emotionally Abused: No    Physically Abused: No    Sexually Abused: No     Past Surgical History:  Procedure Laterality Date   ABDOMINAL HYSTERECTOMY  1986   menorrhagia; hx of cryo for abnormal paps   APPENDECTOMY     removed in 1986 per pt   INTERCOSTAL NERVE BLOCK Right 12/18/2019   Procedure: Intercostal Nerve Block;  Surgeon: Melrose Nakayama, MD;  Location: [facility];  Service: Thoracic;  Laterality: Right;   LOBECTOMY Left 03/13/2018   Procedure: Lingula section of left upper lobe lobectomy;  Surgeon: Melrose Nakayama, MD;  Location: [facility];  Service: Thoracic;  Laterality: Left;   NODE DISSECTION  12/18/2019   Procedure: Node Dissection;  Surgeon: Melrose Nakayama, MD;  Location: [facility];  Service: Thoracic;;   VIDEO ASSISTED THORACOSCOPY (VATS)/WEDGE RESECTION Left 03/13/2018   Procedure: VIDEO ASSISTED THORACOSCOPY (VATS)/WEDGE RESECTION;  Surgeon: Melrose Nakayama, MD;  [facility];  Service: Thoracic;  Laterality: Left;    Family History  Problem Relation Age of Onset   Asthma Mother    Alzheimer's disease Mother 5   Hypertension Mother    Other Father        shot    Neuropathy Sister        face   Cancer Maternal Aunt        breast   Cancer Paternal Aunt    Cancer Paternal Grandmother        abdominal   Lung cancer Paternal Uncle     Allergies  Allergen Reactions   Aspirin Shortness Of Breath   Trelegy Ellipta [Fluticasone-Umeclidin-Vilant] Shortness Of Breath    She reports that is made her cough worse.    Morphine Sulfate Other (See Comments)    RED STREAKS ON ARMS.   Oxycodone Itching and Rash   Glycopyrrolate Other (See Comments)    UNSPECIFIED REACTION  pt unsure if she is allergic.   Lactose Intolerance (Gi) Nausea And Vomiting    Milk only.Patient states that she can eat cheese.   Penicillins Other (See Comments)     Heartburn, upset stomach only & tolerated AMOXIL after this reaction Has patient had a PCN reaction causing immediate rash, facial/tongue/throat swelling, SOB or lightheadedness with  hypotension: No Has patient had a PCN reaction causing severe rash involving mucus membranes or skin necrosis: No Has patient had a PCN reaction that required hospitalization: No Has patient had a PCN reaction occurring within the last 10 years: #  #  #  YES  #  #  #    Sulfa Antibiotics Tinitus    Current Outpatient Medications on File Prior to Visit  Medication Sig Dispense  Refill   albuterol (PROVENTIL) (2.5 MG/3ML) 0.083% nebulizer solution Take 3 mLs (2.5 mg total) by nebulization every 4 (four) hours as needed for wheezing or shortness of breath. 75 mL 12   albuterol (VENTOLIN HFA) 108 (90 Base) MCG/ACT inhaler Inhale 2 puffs into the lungs every 6 (six) hours as needed for wheezing or shortness of breath. 8 g 0   bisoprolol (ZEBETA) 5 MG tablet Take 1 tablet (5 mg total) by mouth daily. 30 tablet 11   Budeson-Glycopyrrol-Formoterol (BREZTRI AEROSPHERE) 160-9-4.8 MCG/ACT AERO Inhale 2 puffs into the lungs 2 (two) times daily. 10.7 g 11   fluticasone (FLONASE) 50 MCG/ACT nasal spray Place 1 spray into both nostrils daily. 18.2 mL 2   guaiFENesin (MUCINEX) 600 MG 12 hr tablet Take 600 mg by mouth 2 (two) times daily as needed for cough or to loosen phlegm.     loratadine (CLARITIN) 10 MG tablet Take 1 tablet (10 mg total) by mouth daily. 30 tablet 3   Melatonin 10 MG TABS Take 40 mg by mouth at bedtime.     pantoprazole (PROTONIX) 40 MG tablet Take 1 tablet (40 mg total) by mouth daily. 90 tablet 0   PARoxetine (PAXIL) 40 MG tablet Take 1 tablet (40 mg total) by mouth daily. 90 tablet 1   traZODone (DESYREL) 100 MG tablet Take 1 tablet (100 mg total) by mouth at bedtime as needed for sleep. 90 tablet 1   No current facility-administered medications on file prior to visit.    BP 120/80   Pulse 75   Temp (!) 97 F (36.1 C) (Tympanic)   Ht 5\' 4"  (1.626 m)   Wt 156 lb (70.8 kg)   SpO2 98%   BMI 26.78 kg/m       Objective:   Physical Exam Vitals and nursing note reviewed.   Cardiovascular:     Rate and Rhythm: Normal rate and regular rhythm.     Pulses: Normal pulses.     Heart sounds: Normal heart sounds.  Pulmonary:     Effort: Tachypnea present.     Breath sounds: Decreased air movement present. No wheezing or rhonchi.  Skin:    General: Skin is warm and dry.  Neurological:     General: No focal deficit present.     Mental Status: She is oriented to person, place, and time.       Assessment & Plan:   1. Air hunger - Will reach out her to pulmonologist. I am fine with her taking this to help with symptoms   2. COPD GOLD 3  - Per pulmonary   3. S/P partial lobectomy of lung - Per pulmonary   4. Adenocarcinoma of left lung, stage 1 ([place]) - Per pulmonary/oncology    [name], NP

## 2022-05-07 DIAGNOSIS — H25013 Cortical age-related cataract, bilateral: Secondary | ICD-10-CM | POA: Diagnosis not present

## 2022-05-07 DIAGNOSIS — H04123 Dry eye syndrome of bilateral lacrimal glands: Secondary | ICD-10-CM | POA: Diagnosis not present

## 2022-05-07 DIAGNOSIS — H25043 Posterior subcapsular polar age-related cataract, bilateral: Secondary | ICD-10-CM | POA: Diagnosis not present

## 2022-05-07 DIAGNOSIS — H2513 Age-related nuclear cataract, bilateral: Secondary | ICD-10-CM | POA: Diagnosis not present

## 2022-05-07 DIAGNOSIS — H2511 Age-related nuclear cataract, right eye: Secondary | ICD-10-CM | POA: Diagnosis not present

## 2022-05-07 DIAGNOSIS — H3540 Unspecified peripheral retinal degeneration: Secondary | ICD-10-CM | POA: Diagnosis not present

## 2022-05-22 ENCOUNTER — Telehealth: Payer: Self-pay | Admitting: Internal Medicine

## 2022-05-22 NOTE — Telephone Encounter (Signed)
Spk to pt, provider instructions given  Nothing further

## 2022-05-22 NOTE — Telephone Encounter (Signed)
In theory the breathing should be a lot better off inderol and a lot less need for albuterol which makes the shaking worse anyway but if hasn't note better breathing and less albuterol need then ok to resume inderal same rx as before

## 2022-05-22 NOTE — Telephone Encounter (Signed)
Pt said her breathing is doing much better with her taking klonopin. Pt wants to know if she might be able to take klonopin with 1/2 of an inderal as she states she really needs to do something to control the shakes.  Dr. Melvyn Novas, please advise on this if you feel like it would be okay for pt to take both of these meds together.

## 2022-05-22 NOTE — Telephone Encounter (Signed)
Called and spoke with pt who states she had been on inderal for her tremors but was taken off of the inderal and placed on bisoprolol. Pt said she has been using the klonopin as well as the bisoprolol.  Pt said that she has been shaking so bad and also states that it is hard for her to walk right now due to how bad she is shaking. Due to this, pt wants to know what might be recommended to help out with the tremors.  Dr. Melvyn Novas, please advise.

## 2022-05-22 NOTE — Telephone Encounter (Signed)
Yes ok to try half dosing goal is to use the lowest dose that works

## 2022-05-23 ENCOUNTER — Telehealth: Payer: Self-pay | Admitting: Internal Medicine

## 2022-05-23 ENCOUNTER — Telehealth (INDEPENDENT_AMBULATORY_CARE_PROVIDER_SITE_OTHER): Payer: Medicare Other | Admitting: Adult Health

## 2022-05-23 ENCOUNTER — Encounter: Payer: Self-pay | Admitting: Adult Health

## 2022-05-23 ENCOUNTER — Other Ambulatory Visit: Payer: Self-pay | Admitting: *Deleted

## 2022-05-23 VITALS — Ht 64.0 in | Wt 150.0 lb

## 2022-05-23 DIAGNOSIS — R251 Tremor, unspecified: Secondary | ICD-10-CM

## 2022-05-23 DIAGNOSIS — F321 Major depressive disorder, single episode, moderate: Secondary | ICD-10-CM

## 2022-05-23 DIAGNOSIS — J449 Chronic obstructive pulmonary disease, unspecified: Secondary | ICD-10-CM | POA: Diagnosis not present

## 2022-05-23 MED ORDER — PAROXETINE HCL 40 MG PO TABS: 40.0000 mg | ORAL_TABLET | Freq: Every day | ORAL | 1 refills | Status: DC

## 2022-05-23 NOTE — Telephone Encounter (Signed)
Please advise, Spk to Patient wants to go back on enderol the bisoprolol is causing the shakes and is working with PCP to get on lowest dose of prolol

## 2022-05-23 NOTE — Progress Notes (Signed)
Virtual Visit via Telephone Note  I connected with Tommie Raymond on 05/23/22 at 11:45 AM EDT by telephone and verified that I am speaking with the correct person using two identifiers.   I discussed the limitations, risks, security and privacy concerns of performing an evaluation and management service by telephone and the availability of in person appointments. I also discussed with the patient that there may be a patient responsible charge related to this service. The patient expressed understanding and agreed to proceed.  Location patient: home Location provider: work or home office Participants present for the call: patient, provider Patient did not have a visit in the prior 7 days to address this/these issue(s).   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  In the past she was taking Inderal 60 mg extended release for upper extremity tremors.  About a month ago she was switched to bisoprolol  5mg  due to severe COPD by her pulmonologist.  He reports today that since starting bisoprolol she has been crying every day, over the last week she has developed worsening upper extremity tremors and feels as though she cannot drive or write due to her shaking.  She wishes to come off the bisoprolol and go back on Inderal.  He also reports that since starting Klonopin for air hunger that her breathing is much better and she can do things around the house and outside that she could not do before.   Observations/Objective: Patient crying on the phone  I do not appreciate any SOB. Speech and thought processing are grossly intact. Patient reported vitals:  Assessment and Plan: 1. COPD GOLD 3  -Advise follow-up with her pulmonologist about stopping  bisoprolol and going back on Inderal.  I have no problem with prescribing her the Inderal as this was done by her PCP in the past but that she needs to touch base with pulmonary first.  She reports that she will call today  2. Tremor    Follow Up Instructions:  DIAGMED@   [id number] [id number] [id number] 11-20 [id number] 21-30 I did not refer this patient for an OV in the next 24 hours for this/these issue(s).  I discussed the assessment and treatment plan with the patient. The patient was provided an opportunity to ask questions and all were answered. The patient agreed with the plan and demonstrated an understanding of the instructions.   The patient was advised to call back or seek an in-person evaluation if the symptoms worsen or if the condition fails to improve as anticipated.  I provided 13 minutes of non-face-to-face time during this encounter.   [name], NP

## 2022-05-24 NOTE — Telephone Encounter (Signed)
She should go back to the original rx when she stops the bisoprolol

## 2022-05-24 NOTE — Telephone Encounter (Signed)
Nothing further.

## 2022-05-24 NOTE — Telephone Encounter (Signed)
Sorry to hear that but thanks for trying it >>> Fine to go back on inderal but ask first if any of her resp symptoms improved or less need for albuterol off it so we'll know what to do at f/u ov

## 2022-05-24 NOTE — Telephone Encounter (Signed)
Spt to pt, states she has no air hunger and will stay on klonopin and work with GP to get shaking under control  Please advise inderal dosage and refill, pt ov is 10/30

## 2022-05-30 ENCOUNTER — Encounter: Payer: Self-pay | Admitting: Adult Health

## 2022-05-30 ENCOUNTER — Ambulatory Visit (INDEPENDENT_AMBULATORY_CARE_PROVIDER_SITE_OTHER): Payer: Medicare Other | Admitting: Adult Health

## 2022-05-30 VITALS — BP 120/88 | HR 78 | Temp 98.4°F | Ht 64.0 in | Wt 155.0 lb

## 2022-05-30 DIAGNOSIS — J449 Chronic obstructive pulmonary disease, unspecified: Secondary | ICD-10-CM | POA: Diagnosis not present

## 2022-05-30 DIAGNOSIS — R251 Tremor, unspecified: Secondary | ICD-10-CM | POA: Diagnosis not present

## 2022-05-30 DIAGNOSIS — R0989 Other specified symptoms and signs involving the circulatory and respiratory systems: Secondary | ICD-10-CM | POA: Diagnosis not present

## 2022-05-30 MED ORDER — PROPRANOLOL HCL ER 60 MG PO CP24: 60.0000 mg | ORAL_CAPSULE | Freq: Every day | ORAL | 3 refills | Status: DC

## 2022-05-30 NOTE — Progress Notes (Signed)
Subjective:    Patient ID: [name], female    DOB: 08/23/1956, 66 y.o.   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  She presents to the office today for follow up regarding air hunger and tremors.   She was seen about a month ago she was started on klonopin 0.5 mg TID PRN, to help with her air hunger from COPD and history of Lung CA,  and she reports that since starting this medication she has been monitoring her pulse ox at home with readings in the 96-99% range without oxygen. She feels less short of breath and can do more things around the house. She continues to use her inhalers and nebulizer.    She also reports that she had to go back on inderal 60 mg ER from bisoprolol due to severe tremors and anxiety . Since restarting inderal her tremors have improved and she has not had any increased breathing issues.     Review of Systems See HPI   Past Medical History:  Diagnosis Date   Anxiety    Arthritis of hand 04/17/2017   Bronchitis    Depression    Dyspnea    Emphysema, unspecified ([place])    per patient mild case   Family history of adverse reaction to anesthesia    patient states sister has a hard time waking up from anesthesia.    Hepatitis C    lung ca dx'd 03/2018   Tremor, unspecified    non-specific; takes inderal.     Social History   Socioeconomic History   Marital status: Widowed    Spouse name: Not on file   Number of children: Not on file   Years of education: Not on file   Highest education level: 12th grade  Occupational History   Not on file  Tobacco Use   Smoking status: Former    Packs/day: 1.00    Years: 40.00    Total pack years: 40.00    Types: Cigarettes    Quit date: 12/14/2017    Years  since quitting: 4.4    Passive exposure: Never   Smokeless tobacco: Never  Vaping Use   Vaping Use: Never used  Substance and Sexual Activity   Alcohol use: Yes    Alcohol/week: 7.0 standard drinks of alcohol    Types: 7 Shots of liquor per week    Comment: occasional bourbon   Drug use: Not Currently   Sexual activity: Never  Other Topics Concern   Not on file  Social History Narrative   Not on file   Social Determinants of Health   Financial Resource Strain: Low Risk  (09/22/2021)   Overall Financial Resource Strain (CARDIA)    Difficulty of Paying Living Expenses: Not hard at all  Food Insecurity: No Food Insecurity (09/22/2021)   Hunger Vital Sign    Worried About Running Out of Food in the Last Year: Never true    Ran Out of Food in the Last Year: Never true  Transportation Needs: No Transportation Needs (09/22/2021)   PRAPARE - Hydrologist (Medical): No    Lack of Transportation (Non-Medical): No  Physical Activity: Inactive (09/22/2021)   Exercise Vital Sign    Days of Exercise per Week: 0 days  Minutes of Exercise per Session: 0 min  Stress: No Stress Concern Present (09/22/2021)   [place]    Feeling of Stress : Not at all  Social Connections: Moderately Isolated (09/22/2021)   Social Connection and Isolation Panel [NHANES]    Frequency of Communication with Friends and Family: More than three times a week    Frequency of Social Gatherings with Friends and Family: More than three times a week    Attends Religious Services: Never    Marine scientist or Organizations: No    Attends Archivist Meetings: Never    Marital Status: Married  Human resources officer Violence: Not At Risk (09/22/2021)   Humiliation, Afraid, Rape, and Kick questionnaire    Fear of Current or Ex-Partner: No    Emotionally Abused: No    Physically Abused: No    Sexually Abused: No     Past Surgical History:  Procedure Laterality Date   ABDOMINAL HYSTERECTOMY  1986   menorrhagia; hx of cryo for abnormal paps   APPENDECTOMY     removed in 1986 per pt   INTERCOSTAL NERVE BLOCK Right 12/18/2019   Procedure: Intercostal Nerve Block;  Surgeon: Melrose Nakayama, MD;  Location: [facility];  Service: Thoracic;  Laterality: Right;   LOBECTOMY Left 03/13/2018   Procedure: Lingula section of left upper lobe lobectomy;  Surgeon: Melrose Nakayama, MD;  Location: [facility];  Service: Thoracic;  Laterality: Left;   NODE DISSECTION  12/18/2019   Procedure: Node Dissection;  Surgeon: Melrose Nakayama, MD;  Location: [facility];  Service: Thoracic;;   VIDEO ASSISTED THORACOSCOPY (VATS)/WEDGE RESECTION Left 03/13/2018   Procedure: VIDEO ASSISTED THORACOSCOPY (VATS)/WEDGE RESECTION;  Surgeon: Melrose Nakayama, MD;  Location: [facility] OR;  Service: Thoracic;  Laterality: Left;    Family History  Problem Relation Age of Onset   Asthma Mother    Alzheimer's disease Mother 14   Hypertension Mother    Other Father        shot    Neuropathy Sister        face   Cancer Maternal Aunt        breast   Cancer Paternal Aunt    Cancer Paternal Grandmother        abdominal   Lung cancer Paternal Uncle     Allergies  Allergen Reactions   Aspirin Shortness Of Breath   Trelegy Ellipta [Fluticasone-Umeclidin-Vilant] Shortness Of Breath    She reports that is made her cough worse.    Morphine Sulfate Other (See Comments)    RED STREAKS ON ARMS.   Oxycodone Itching and Rash   Glycopyrrolate Other (See Comments)    UNSPECIFIED REACTION  pt unsure if she is allergic.   Lactose Intolerance (Gi) Nausea And Vomiting    Milk only.Patient states that she can eat cheese.   Penicillins Other (See Comments)     Heartburn, upset stomach only & tolerated AMOXIL after this reaction Has patient had a PCN reaction causing immediate rash, facial/tongue/throat swelling, SOB or lightheadedness with  hypotension: No Has patient had a PCN reaction causing severe rash involving mucus membranes or skin necrosis: No Has patient had a PCN reaction that required hospitalization: No Has patient had a PCN reaction occurring within the last 10 years: #  #  #  YES  #  #  #    Sulfa Antibiotics Tinitus    Current Outpatient Medications on File Prior to Visit  Medication Sig Dispense Refill   albuterol (PROVENTIL) (2.5 MG/3ML) 0.083% nebulizer solution Take 3 mLs (2.5 mg total) by nebulization every 4 (four) hours as needed for wheezing or shortness of breath. 75 mL 12   albuterol (VENTOLIN HFA) 108 (90 Base) MCG/ACT inhaler Inhale 2 puffs into the lungs every 6 (six) hours as needed for wheezing or shortness of breath. 8 g 0   Budeson-Glycopyrrol-Formoterol (BREZTRI AEROSPHERE) 160-9-4.8 MCG/ACT AERO Inhale 2 puffs into the lungs 2 (two) times daily. 10.7 g 11   clonazePAM (KLONOPIN) 0.5 MG tablet Take 1 tablet (0.5 mg total) by mouth 3 (three) times daily as needed for anxiety. 90 tablet 1   fluticasone (FLONASE) 50 MCG/ACT nasal spray Place 1 spray into both nostrils daily. 18.2 mL 2   guaiFENesin (MUCINEX) 600 MG 12 hr tablet Take 600 mg by mouth 2 (two) times daily as needed for cough or to loosen phlegm.     loratadine (CLARITIN) 10 MG tablet Take 1 tablet (10 mg total) by mouth daily. 30 tablet 3   Melatonin 10 MG TABS Take 40 mg by mouth at bedtime.     pantoprazole (PROTONIX) 40 MG tablet Take 1 tablet (40 mg total) by mouth daily. 90 tablet 0   PARoxetine (PAXIL) 40 MG tablet Take 1 tablet (40 mg total) by mouth daily. 90 tablet 1   traZODone (DESYREL) 100 MG tablet Take 1 tablet (100 mg total) by mouth at bedtime as needed for sleep. 90 tablet 1   No current facility-administered medications on file prior to visit.    BP 120/88   Pulse 78   Temp 98.4 F (36.9 C) (Oral)   Ht 5\' 4"  (1.626 m)   Wt 155 lb (70.3 kg)   SpO2 93%   BMI 26.61 kg/m       Objective:   Physical  Exam Vitals and nursing note reviewed.  Constitutional:      Appearance: Normal appearance.  Cardiovascular:     Rate and Rhythm: Normal rate and regular rhythm.     Pulses: Normal pulses.     Heart sounds: Normal heart sounds.  Pulmonary:     Effort: Pulmonary effort is normal.     Breath sounds: Decreased air movement present. Decreased breath sounds present. No wheezing, rhonchi or rales.  Musculoskeletal:        General: Normal range of motion.  Skin:    General: Skin is warm and dry.     Capillary Refill: Capillary refill takes less than 2 seconds.  Neurological:     General: No focal deficit present.     Mental Status: She is alert and oriented to person, place, and time.     Motor: Tremor (mild BUE) present.  Psychiatric:        Mood and Affect: Mood normal.        Behavior: Behavior normal.        Thought Content: Thought content normal.        Judgment: Judgment normal.       Assessment & Plan:  1. COPD GOLD 3  - Continue with klonopin and inhalers   2. Tremor  - propranolol ER (INDERAL LA) 60 MG 24 hr capsule; Take 1 capsule (60 mg total) by mouth daily.  Dispense: 90 capsule; Refill: 3 - Follow up if breathing gets worse with inderal  3. Air hunger - Continue with klonopin and inhalers.   [employer]

## 2022-06-01 ENCOUNTER — Telehealth: Payer: Self-pay | Admitting: Internal Medicine

## 2022-06-01 NOTE — Telephone Encounter (Signed)
Called patient regarding 09/01 scheduled message, rescheduled October appointments per patient's request.

## 2022-06-05 ENCOUNTER — Telehealth: Payer: Self-pay | Admitting: Adult Health

## 2022-06-05 NOTE — Telephone Encounter (Signed)
Pt states the pharmacy does not have the prescription for PARoxetine (PAXIL) 40 MG tablet

## 2022-06-06 ENCOUNTER — Encounter (INDEPENDENT_AMBULATORY_CARE_PROVIDER_SITE_OTHER): Payer: Medicare Other | Admitting: Ophthalmology

## 2022-06-06 DIAGNOSIS — H43813 Vitreous degeneration, bilateral: Secondary | ICD-10-CM

## 2022-06-06 DIAGNOSIS — H4423 Degenerative myopia, bilateral: Secondary | ICD-10-CM

## 2022-06-07 NOTE — Telephone Encounter (Signed)
Patient stated that she called the pharmacy and they do have the Rx. No further action needed at this time!

## 2022-06-12 ENCOUNTER — Other Ambulatory Visit: Payer: Self-pay | Admitting: Adult Health

## 2022-06-12 DIAGNOSIS — K2100 Gastro-esophageal reflux disease with esophagitis, without bleeding: Secondary | ICD-10-CM

## 2022-06-18 ENCOUNTER — Ambulatory Visit: Payer: Medicare Other | Admitting: Internal Medicine

## 2022-06-28 ENCOUNTER — Telehealth: Payer: Self-pay | Admitting: Physician Assistant

## 2022-06-28 NOTE — Telephone Encounter (Signed)
Called patient regarding upcoming October appointments, patient is notified.  

## 2022-07-01 HISTORY — PX: CATARACT EXTRACTION: SUR2

## 2022-07-03 DIAGNOSIS — H2511 Age-related nuclear cataract, right eye: Secondary | ICD-10-CM | POA: Diagnosis not present

## 2022-07-03 DIAGNOSIS — H2512 Age-related nuclear cataract, left eye: Secondary | ICD-10-CM | POA: Diagnosis not present

## 2022-07-03 DIAGNOSIS — H269 Unspecified cataract: Secondary | ICD-10-CM | POA: Diagnosis not present

## 2022-07-05 ENCOUNTER — Other Ambulatory Visit: Payer: Self-pay | Admitting: *Deleted

## 2022-07-05 MED ORDER — ALBUTEROL SULFATE (2.5 MG/3ML) 0.083% IN NEBU: 2.5000 mg | INHALATION_SOLUTION | RESPIRATORY_TRACT | 12 refills | Status: DC | PRN

## 2022-07-06 ENCOUNTER — Other Ambulatory Visit: Payer: Medicare Other

## 2022-07-09 ENCOUNTER — Ambulatory Visit: Payer: Medicare Other | Admitting: Internal Medicine

## 2022-07-10 DIAGNOSIS — H2512 Age-related nuclear cataract, left eye: Secondary | ICD-10-CM | POA: Diagnosis not present

## 2022-07-10 DIAGNOSIS — H269 Unspecified cataract: Secondary | ICD-10-CM | POA: Diagnosis not present

## 2022-07-24 ENCOUNTER — Ambulatory Visit (HOSPITAL_COMMUNITY)
Admission: RE | Admit: 2022-07-24 | Discharge: 2022-07-24 | Disposition: A | Discharge status: Home / Self Care | Payer: Medicare Other | Source: Ambulatory Visit | Attending: Internal Medicine | Admitting: Internal Medicine

## 2022-07-24 ENCOUNTER — Inpatient Hospital Stay: Payer: Medicare Other | Attending: Internal Medicine

## 2022-07-24 DIAGNOSIS — Z902 Acquired absence of lung [part of]: Secondary | ICD-10-CM | POA: Insufficient documentation

## 2022-07-24 DIAGNOSIS — R918 Other nonspecific abnormal finding of lung field: Secondary | ICD-10-CM | POA: Diagnosis not present

## 2022-07-24 DIAGNOSIS — C3490 Malignant neoplasm of unspecified part of unspecified bronchus or lung: Secondary | ICD-10-CM | POA: Insufficient documentation

## 2022-07-24 DIAGNOSIS — J449 Chronic obstructive pulmonary disease, unspecified: Secondary | ICD-10-CM | POA: Diagnosis not present

## 2022-07-24 DIAGNOSIS — Z85118 Personal history of other malignant neoplasm of bronchus and lung: Secondary | ICD-10-CM | POA: Insufficient documentation

## 2022-07-24 DIAGNOSIS — J439 Emphysema, unspecified: Secondary | ICD-10-CM | POA: Diagnosis not present

## 2022-07-24 LAB — CMP (CANCER CENTER ONLY)
ALT: 17 U/L (ref 0–44)
AST: 20 U/L (ref 15–41)
Albumin: 4.4 g/dL (ref 3.5–5.0)
Alkaline Phosphatase: 90 U/L (ref 38–126)
Anion gap: 8 (ref 5–15)
BUN: 10 mg/dL (ref 8–23)
CO2: 26 mmol/L (ref 22–32)
Calcium: 9.7 mg/dL (ref 8.9–10.3)
Chloride: 105 mmol/L (ref 98–111)
Creatinine: 0.89 mg/dL (ref 0.44–1.00)
GFR, Estimated: >60 mL/min (ref >60)
Glucose, Bld: 99 mg/dL (ref 70–99)
Potassium: 4.3 mmol/L (ref 3.5–5.1)
Sodium: 139 mmol/L (ref 135–145)
Total Bilirubin: 0.7 mg/dL (ref 0.3–1.2)
Total Protein: 7.8 g/dL (ref 6.5–8.1)

## 2022-07-24 LAB — CBC WITH DIFFERENTIAL (CANCER CENTER ONLY)
Abs Immature Granulocytes: 0.01 10*3/uL (ref 0.00–0.07)
Basophils Absolute: 0.1 10*3/uL (ref 0.0–0.1)
Basophils Relative: 1 %
Eosinophils Absolute: 0.3 10*3/uL (ref 0.0–0.5)
Eosinophils Relative: 5 %
HCT: 44.9 % (ref 36.0–46.0)
Hemoglobin: 15 g/dL (ref 12.0–15.0)
Immature Granulocytes: 0 %
Lymphocytes Relative: 20 %
Lymphs Abs: 1.3 10*3/uL (ref 0.7–4.0)
MCH: 27.7 pg (ref 26.0–34.0)
MCHC: 33.4 g/dL (ref 30.0–36.0)
MCV: 83 fL (ref 80.0–100.0)
Monocytes Absolute: 0.5 10*3/uL (ref 0.1–1.0)
Monocytes Relative: 8 %
Neutro Abs: 4.3 10*3/uL (ref 1.7–7.7)
Neutrophils Relative %: 66 %
Platelet Count: 281 10*3/uL (ref 150–400)
RBC: 5.41 MIL/uL — ABNORMAL HIGH (ref 3.87–5.11)
RDW: 12.9 % (ref 11.5–15.5)
WBC Count: 6.6 10*3/uL (ref 4.0–10.5)
nRBC: 0 % (ref 0.0–0.2)

## 2022-07-24 MED ORDER — IOHEXOL 300 MG/ML  SOLN
75.0000 mL | Freq: Once | INTRAMUSCULAR | Status: AC | PRN
  Administered 2022-07-24: 75 mL via INTRAVENOUS

## 2022-07-24 MED ORDER — SODIUM CHLORIDE (PF) 0.9 % IJ SOLN
INTRAMUSCULAR | Status: AC
  Filled 2022-07-24: qty 50

## 2022-07-24 NOTE — Progress Notes (Signed)
[name]  Patricia Peng, NP [place] [address]  DIAGNOSIS:  1) Stage Ib (T2, N0, M0) non-small cell lung cancer, adenocarcinoma diagnosed in June 2019. 2) stage Ia (T1 a, N0, M0) non-small cell lung cancer, adenocarcinoma status post  PRIOR THERAPY: 1) status post wedge resection of the left upper lobe nodule in addition to lingular segmentectomy and mediastinal lymph node sampling under the care of Dr. Roxan Hockey on March 13, 2018.  Right lower lobe segmentectomy under the care of Dr. Roxan Hockey on December 18, 2019.  CURRENT THERAPY: Observation  INTERVAL HISTORY: [name] 66 y.o. female returns to the clinic today for a follow-up visit.  The patient was last seen on 04/12/2021.  Since that time, unfortunately her husband passed away.  Since last being seen the patient the patient notes a difficult bout of pneumonia that resulted in hospitalization and fluctuations in weight.  She reports she is currently experiencing a COPD/asthma exacerbation. She reports using a nebulizer and inhalers throughout the day and sees a pulmonologist. She reports increased dyspnea, coughing with a clear, sometimes green, phlegm production. She thinks her exacerbation is starting to improve. She denies chest pain or hemoptysis. She reports increased fatigue and lack of energy. She reports about 3-4 exacerbations a year.   She denies any fever, chills, night sweats, or unexplained weight loss.  She denies any nausea, vomiting, diarrhea, or constipation.  She denies any headaches beyond her baseline. She reports visual changes, worsening since taking long term steroids last lear, but better since getting cataract surgery.   She reports nerve pain from her surgery that worsen with cough.   The patient recently had a restaging CT scan performed.  She is here today for evaluation and to review her scan results.      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    Hepatitis C    lung ca dx'd 03/2018   Tremor, unspecified    non-specific; takes inderal.     ALLERGIES:  is allergic to aspirin, trelegy ellipta [fluticasone-umeclidin-vilant], morphine sulfate, oxycodone, glycopyrrolate, lactose intolerance (gi), penicillins, and sulfa antibiotics.  MEDICATIONS:  Current Outpatient Medications  Medication Sig Dispense Refill   albuterol (PROVENTIL) (2.5 MG/3ML) 0.083% nebulizer solution Take 3 mLs (2.5 mg total) by nebulization every 4 (four) hours as needed for wheezing or shortness of breath. 75 mL 12   albuterol (VENTOLIN HFA) 108 (90 Base) MCG/ACT inhaler Inhale 2 puffs into the lungs every 6 (six) hours as needed for wheezing or shortness of breath. 8 g 0   Budeson-Glycopyrrol-Formoterol (BREZTRI AEROSPHERE) 160-9-4.8 MCG/ACT AERO Inhale 2 puffs into the lungs 2 (two) times daily. 10.7 g 11   clonazePAM (KLONOPIN) 0.5 MG tablet Take 1 tablet (0.5 mg total) by mouth 3 (three) times daily as needed for anxiety. 90 tablet 1   fluticasone (FLONASE) 50 MCG/ACT nasal spray Place 1 spray into both nostrils daily. 18.2 mL 2   guaiFENesin (MUCINEX) 600 MG 12 hr tablet Take 600 mg by mouth 2 (two) times daily as needed for cough or to loosen phlegm.     loratadine (CLARITIN) 10 MG  tablet Take 1 tablet (10 mg total) by mouth daily. 30 tablet 3   Melatonin 10 MG TABS Take 40 mg by mouth at bedtime.     pantoprazole (PROTONIX) 40 MG tablet Take 1 tablet by mouth once daily 90 tablet 0   PARoxetine (PAXIL) 40 MG tablet Take 1 tablet (40 mg total) by mouth daily. 90 tablet 1   propranolol ER (INDERAL LA) 60 MG 24 hr capsule Take 1 capsule (60 mg total) by mouth daily. 90 capsule 3    traZODone (DESYREL) 100 MG tablet Take 1 tablet (100 mg total) by mouth at bedtime as needed for sleep. 90 tablet 1   No current facility-administered medications for this visit.   Facility-Administered Medications Ordered in Other Visits  Medication Dose Route Frequency Provider Last Rate Last Admin   sodium chloride (PF) 0.9 % injection             SURGICAL HISTORY:  Past Surgical History:  Procedure Laterality Date   ABDOMINAL HYSTERECTOMY  1986   menorrhagia; hx of cryo for abnormal paps   APPENDECTOMY     removed in 1986 per pt   INTERCOSTAL NERVE BLOCK Right 12/18/2019   Procedure: Intercostal Nerve Block;  Surgeon: Melrose Nakayama, MD;  Location: [facility];  Service: Thoracic;  Laterality: Right;   LOBECTOMY Left 03/13/2018   Procedure: Lingula section of left upper lobe lobectomy;  Surgeon: Melrose Nakayama, MD;  Location: [facility];  Service: Thoracic;  Laterality: Left;   NODE DISSECTION  12/18/2019   Procedure: Node Dissection;  Surgeon: Melrose Nakayama, MD;  Location: [facility];  Service: Thoracic;;   VIDEO ASSISTED THORACOSCOPY (VATS)/WEDGE RESECTION Left 03/13/2018   Procedure: VIDEO ASSISTED THORACOSCOPY (VATS)/WEDGE RESECTION;  Surgeon: Melrose Nakayama, MD;  Location: [facility];  Service: Thoracic;  Laterality: Left;    REVIEW OF SYSTEMS:   Review of Systems  Constitutional: Negative for appetite change, chills, fatigue, fever and unexpected weight change.  HENT:   Negative for mouth sores, nosebleeds, sore throat and trouble swallowing.   Eyes: Negative for eye problems and icterus.  Respiratory: Negative for  hemoptysis, and wheezing.  Shortness of breath, cough  Cardiovascular: Negative for chest pain and leg swelling.  Gastrointestinal: Negative for abdominal pain, constipation, diarrhea, nausea and vomiting.  Genitourinary: Negative for bladder incontinence, difficulty urinating, dysuria, frequency and hematuria.   Musculoskeletal: Negative for back pain,  gait problem, neck pain and neck stiffness.  Skin: Negative for itching and rash.  Neurological: Negative for dizziness, extremity weakness, gait problem, headaches, light-headedness and seizures.  Hematological: Negative for adenopathy. Does not bruise/bleed easily.  Psychiatric/Behavioral: Negative for confusion, depression and sleep disturbance. The patient is not nervous/anxious.     PHYSICAL EXAMINATION:  There were no vitals taken for this visit.  ECOG PERFORMANCE STATUS: 1  Physical Exam  Constitutional: Oriented to person, place, and time and well-developed, well-nourished, and in no distress.  HENT:  Head: Normocephalic and atraumatic.  Mouth/Throat: Oropharynx is clear and moist. No oropharyngeal exudate.  Eyes: Conjunctivae are normal. Right eye exhibits no discharge. Left eye exhibits no discharge. No scleral icterus.  Neck: Normal range of motion. Neck supple.  Cardiovascular: Normal rate, regular rhythm, normal heart sounds and intact distal pulses.   Pulmonary/Chest: Effort normal. Quiet breath sounds bilaterally. No respiratory distress. No wheezes. No rales.  Abdominal: Soft. Bowel sounds are normal. Exhibits no distension and no mass. There is no tenderness.  Musculoskeletal: Normal range of motion. Exhibits no edema.  Lymphadenopathy:    No cervical adenopathy.  Neurological: Alert and oriented to person, place, and time. Gait normal. Coordination normal. Uses wheelchair during visit  Skin: Skin is warm and dry. No rash noted. Not diaphoretic. No erythema. No pallor.  Psychiatric: Mood, memory and judgment normal.  Vitals reviewed.  LABORATORY DATA: Lab Results  Component Value Date   WBC 6.6 07/24/2022   HGB 15.0 07/24/2022   HCT 44.9 07/24/2022   MCV 83.0 07/24/2022   PLT 281 07/24/2022      Chemistry      Component Value Date/Time   NA 139 07/24/2022 1030   K 4.3 07/24/2022 1030   CL 105 07/24/2022 1030   CO2 26 07/24/2022 1030   BUN 10 07/24/2022  1030   CREATININE 0.89 07/24/2022 1030   CREATININE 0.73 10/27/2021 1543      Component Value Date/Time   CALCIUM 9.7 07/24/2022 1030   ALKPHOS 90 07/24/2022 1030   AST 20 07/24/2022 1030   ALT 17 07/24/2022 1030   ALT 20 10/10/2017 1653   BILITOT 0.7 07/24/2022 1030       RADIOGRAPHIC STUDIES:  No results found.   ASSESSMENT/PLAN:  This is a very pleasant 66 year old Caucasian female with a history of stage Ib non-small cell lung cancer, adenocarcinoma status post left upper segmental resection in addition to lingulectomy with mediastinal lymph node dissection in 2019.  She was also diagnosed in March 2021 with stage Ia non-small cell lung cancer, adenocarcinoma status post right lower lobe segmentectomy.  She has been on observation since that time and feeling well.  The patient was lost to follow-up since July 2022   The patient recently had a restaging CT scan performed.  The patient was seen with Dr. Julien Nordmann today.  Dr. Julien Nordmann personally independently reviewed the scan and discussed the results with the patient today.  The scan showed no new or progressive findings. Dr. Julien Nordmann recommends that she continue on observation with a repeat CT scan of the chest in 1 year.  We will see her back for follow-up at that time to review her scan results.  She will continue to follow with pulmonary medicine for her COPD management.   The patient was advised to call immediately if she has any concerning symptoms in the interval. The patient voices understanding of current disease status and treatment options and is in agreement with the current care plan. All questions were answered. The patient knows to call the clinic with any problems, questions or concerns. We can certainly see the patient much sooner if necessary   No orders of the defined types were placed in this encounter.    Gwenda Heiner L Aisling Emigh, PA-C 07/24/22  ADDENDUM: Hematology/Oncology Attending: I had a  face-to-face encounter with the patient.  I reviewed her records, lab, scan and recommended her care plan.  This is a very pleasant 66 years old white female with history of stage Ib non-small cell lung cancer, adenocarcinoma status post left upper lobe segmental resection in addition to lingulectomy and mediastinal lymph node dissection in 2019.  The patient has been on observation since that time.  She was lost to follow-up since July 2022.  The patient presented today for reevaluation with repeat CT scan of the chest. Her scan showed no concerning findings for disease recurrence or metastasis. I recommended for her to continue on observation with repeat CT scan of the chest in 1 year. For the history of COPD, she will continue to follow with her pulmonologist  Dr. Melvyn Novas. The patient was advised to call immediately if she has any other concerning symptoms in the interval.  Disclaimer: This note was dictated with voice recognition software. Similar sounding words can inadvertently be transcribed and may be missed upon review. Eilleen Kempf, MD

## 2022-07-26 ENCOUNTER — Inpatient Hospital Stay (HOSPITAL_BASED_OUTPATIENT_CLINIC_OR_DEPARTMENT_OTHER): Payer: Medicare Other | Admitting: Physician Assistant

## 2022-07-26 ENCOUNTER — Ambulatory Visit: Payer: Medicare Other | Admitting: Internal Medicine

## 2022-07-26 ENCOUNTER — Other Ambulatory Visit: Payer: Self-pay

## 2022-07-26 VITALS — BP 122/74 | HR 78 | Temp 98.2°F | Resp 18 | Wt 152.5 lb

## 2022-07-26 DIAGNOSIS — C3491 Malignant neoplasm of unspecified part of right bronchus or lung: Secondary | ICD-10-CM | POA: Diagnosis not present

## 2022-07-26 DIAGNOSIS — Z902 Acquired absence of lung [part of]: Secondary | ICD-10-CM | POA: Diagnosis not present

## 2022-07-26 DIAGNOSIS — C3492 Malignant neoplasm of unspecified part of left bronchus or lung: Secondary | ICD-10-CM

## 2022-07-26 DIAGNOSIS — C3490 Malignant neoplasm of unspecified part of unspecified bronchus or lung: Secondary | ICD-10-CM | POA: Diagnosis not present

## 2022-07-26 DIAGNOSIS — J449 Chronic obstructive pulmonary disease, unspecified: Secondary | ICD-10-CM | POA: Diagnosis not present

## 2022-07-30 ENCOUNTER — Ambulatory Visit: Payer: Medicare Other | Admitting: Internal Medicine

## 2022-08-14 ENCOUNTER — Encounter: Payer: Self-pay | Admitting: Internal Medicine

## 2022-08-14 ENCOUNTER — Ambulatory Visit (INDEPENDENT_AMBULATORY_CARE_PROVIDER_SITE_OTHER): Payer: Medicare Other | Admitting: Internal Medicine

## 2022-08-14 VITALS — BP 112/66 | HR 76 | Ht 64.0 in | Wt 150.8 lb

## 2022-08-14 DIAGNOSIS — R251 Tremor, unspecified: Secondary | ICD-10-CM

## 2022-08-14 DIAGNOSIS — J449 Chronic obstructive pulmonary disease, unspecified: Secondary | ICD-10-CM | POA: Diagnosis not present

## 2022-08-14 DIAGNOSIS — J9611 Chronic respiratory failure with hypoxia: Secondary | ICD-10-CM

## 2022-08-14 NOTE — Assessment & Plan Note (Signed)
Advised: Make sure you check your oxygen saturation  AT  your highest level of activity (not after you stop)   to be sure it stays over 90% and if not use POC >>>  02 flow upward to maintain this level if needed but remember to turn it back to previous settings when you stop (to conserve your supply).

## 2022-08-14 NOTE — Progress Notes (Signed)
[name], female    DOB: 1955/10/28,    MRN: [id number]   Brief patient profile:  8 yowf  quit smoking 2019 p started albuterol  inhalers around 2014 then added symbicort changed to wixella  due to cost referred to pulmonary clinic 06/29/2021 by Dr Ethlyn Gallery  who recently changed to breztri which seemed to helped even more.   PFTs 12/14/19 c/w GOLD 3 copd   04/12/21 oncology note DIAGNOSIS:  1) Stage Ib (T2, N0, M0) non-small cell lung cancer, adenocarcinoma diagnosed in June 2019. 2) stage Ia (T1 a, N0, M0) non-small cell lung cancer, adenocarcinoma status post   PRIOR THERAPY:  1) status post wedge resection of the left upper lobe nodule in addition to lingular segmentectomy and mediastinal lymph node sampling under the care of Dr. Roxan Hockey on March 13, 2018.  Right lower lobe segmentectomy under the care of Dr. Roxan Hockey on December 18, 2019.   CURRENT THERAPY: Observation  History of Present Illness  06/29/2021  Pulmonary/ 1st office eval/[name]  Chief Complaint  Patient presents with   Pulmonary Consult     Referred by Dr. Gwyneth Revels. Pt has hx of lung ca and she was told back in 2018 she has COPD. She gets winded just walking around her house. She had PNA approx 2 months.    Dyspnea:  one room to the next / shops on line  Cough: prednisone helped still some residual dry cough  Sleep: on side / electric bed 30 degrees  SABA use: one bid hfa/ neb once daily  Rec Plan A = Automatic = Always=    wixella one twice daily  = trelegy 100 once daily  or Breztri 2 puff every 12 hours  Breathe out thru nose  Plan B = Backup (to supplement plan A, not to replace it) Only use your albuterol inhaler as a rescue medication  Plan C = Crisis (instead of Plan B but only if Plan B stops working) - only use your albuterol nebulizer if you first try Plan B and it fails to help Call we the cheapest alternatives  for example wixellla / incruse   = trelegy       09/07/2021  f/u  ov/[name] re: GOLD 3 COPD   maint on breztri 2bid  / pred 40 taper / mucinex/ no abx Chief Complaint  Patient presents with   Follow-up    Pt states concerns for COPD exacerbation  Dyspnea:  worse since end of Oct  2022  Cough flutter valve helps > mucus clear now  Sleeping: better now  SABA use: one duoneb at 5 h prior to OV   02: none  Covid status:   vax x 2  Rec Plan A = Automatic = Always=    symbicort 160 instead of breztri @ Take 2 puffs first thing in am and then another 2 puffs about 12 hours later.   Plan B = Backup (to supplement plan A, not to replace it) Only use your albuterol (PROAIR) inhaler as a rescue medication Plan C = Crisis (instead of Plan B but only if Plan B stops working) - only use your albuterol/atrovent  nebulizer if you first try Plan B and it fails to help  Plan D = Doctor - call me if B and C not adequate    Use the lowest possible dose of Inderal      03/07/2022  f/u ov/[name] re: GOLD 3   maint on breztri and pred 10 mg daily  Chief Complaint  Patient presents with   Follow-up    Pt states she is still having problems with her breathing since last visit. States some inflammation was found in lower left lung on a PET scan.  Dyspnea:  was doing pulmonary rehab but could not keep it up but still better than baseline  Mainly walking in house  Cough: minimal / some worse with talking  Sleeping: on side up 6 in SABA use: hfa avg once a day / neb q 3-4 days  02: 2lpm Covid status:   vax x 2/ infected x one dec 2022  Rec Work on inhaler technique:   - remember how golfers warm up  [place] for now is 2 until better and then 1 daily   Please schedule a follow up visit in 3 months but call sooner if needed    04/23/2022  f/u ov/[name] re: copd on inderol 60  -  maint on breztri last dose 2 weeks prior and no change  off pred Chief Complaint  Patient presents with   Follow-up    Pt states she has no new issues and she finished the prednisone but no  refill.  Dyspnea:  room to room  at home no better on prednisone  Cough: min mucoid no pattern Sleeping: 6 in blocks one pillow and on L side ok  SABA use: rarely  02: 2lpm POC  and sats always 96% or above  Covid status:   x 2 vax  Rec Try stop propranolol and on  bisoprolol 5 mg one daily (take one half daily if too strong)  Ok to try albuterol 15 min before an activity (on alternating days between inhaler/ nebulizer or nothing )  that you know would usually make you short of breath  Make sure you check your oxygen saturation  AT  your highest level of activity (not after you stop)   to be sure it stays over 90% We may need clonazepam to help reduce your air hunger       08/14/2022  f/u ov/[name] re: gold 3 maint on breztri  / could not tol bisoprolol due to shaking but helped breathing  Chief Complaint  Patient presents with   Follow-up    Breathing has improved slightly. She has not used her albuterol inhaler in the past wk. She has non prod cough.    Dyspnea:  around the house  Cough: sometimes with coughing  Sleeping: 6 in bed blocks on side  SABA use: rarely needing hfa/ much less neb  02 has it, not using,sats usually > 90% walking on RA though very sedentary "they fired" me from rehab    No obvious day to day or daytime variability or assoc excess/ purulent sputum or mucus plugs or hemoptysis or cp or chest tightness, subjective wheeze or overt sinus or hb symptoms.   Sleeping  without nocturnal  or early am exacerbation  of respiratory  c/o's or need for noct saba. Also denies any obvious fluctuation of symptoms with weather or environmental changes or other aggravating or alleviating factors except as outlined above   No unusual exposure hx or h/o childhood pna/ asthma or knowledge of premature birth.  Current Allergies, Complete Past Medical History, Past Surgical History, Family History, and Social History were reviewed in [employer] record.  ROS   The following are not active complaints unless bolded Hoarseness, sore throat, dysphagia, dental problems, itching, sneezing,  nasal congestion or discharge of excess mucus or  purulent secretions, ear ache,   fever, chills, sweats, unintended wt loss or wt gain, classically pleuritic or exertional cp,  orthopnea pnd or arm/hand swelling  or leg swelling, presyncope, palpitations, abdominal pain, anorexia, nausea, vomiting, diarrhea  or change in bowel habits or change in bladder habits, change in stools or change in urine, dysuria, hematuria,  rash, arthralgias, visual complaints, headache, numbness, weakness or ataxia or problems with walking or coordination,  change in mood or  memory.        Current Meds  Medication Sig   albuterol (PROVENTIL) (2.5 MG/3ML) 0.083% nebulizer solution Take 3 mLs (2.5 mg total) by nebulization every 4 (four) hours as needed for wheezing or shortness of breath.   albuterol (VENTOLIN HFA) 108 (90 Base) MCG/ACT inhaler Inhale 2 puffs into the lungs every 6 (six) hours as needed for wheezing or shortness of breath.   Budeson-Glycopyrrol-Formoterol (BREZTRI AEROSPHERE) 160-9-4.8 MCG/ACT AERO Inhale 2 puffs into the lungs 2 (two) times daily.   clonazePAM (KLONOPIN) 0.5 MG tablet Take 1 tablet (0.5 mg total) by mouth 3 (three) times daily as needed for anxiety.   fluticasone (FLONASE) 50 MCG/ACT nasal spray Place 1 spray into both nostrils daily.   guaiFENesin (MUCINEX) 600 MG 12 hr tablet Take 600 mg by mouth 2 (two) times daily as needed for cough or to loosen phlegm.   pantoprazole (PROTONIX) 40 MG tablet Take 1 tablet by mouth once daily   PARoxetine (PAXIL) 40 MG tablet Take 1 tablet (40 mg total) by mouth daily.   propranolol ER (INDERAL LA) 60 MG 24 hr capsule Take 1 capsule (60 mg total) by mouth daily.   traZODone (DESYREL) 100 MG tablet Take 1 tablet (100 mg total) by mouth at bedtime as needed for sleep.            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    Hepatitis C    lung ca dx'd 03/2018   Tremor, unspecified    non-specific; takes inderal.          Objective:     Wts  08/14/2022      150  04/23/2022       156   03/07/2022        156   09/07/21 125 lb (56.7 kg)  06/29/21 120 lb (54.4 kg)  06/07/21 119 lb 11.2 oz (54.3 kg)    Vital signs reviewed  08/14/2022  - Note at rest 02 sats  96% on RA   General appearance:    pleasant amb wf nad    HEENT : Oropharynx  clear       NECK :  without  apparent JVD/ palpable Nodes/TM    LUNGS: no acc muscle use,  Mild barrel  contour chest wall with bilateral  Distant bs s audible wheeze and  without cough on insp or exp maneuvers  and mild  Hyperresonant  to  percussion bilaterally     CV:  RRR  no s3 or murmur or increase in P2, and no edema   ABD:  soft and nontender    MS:  Nl gait/ ext warm without deformities Or obvious joint restrictions  calf tenderness, cyanosis or clubbing     SKIN: warm and dry without lesions    NEURO:  alert, approp, nl sensorium with  no motor or cerebellar deficits apparent.      I personally reviewed images and agree with radiology impression as follows:   Chest CTa 07/24/22  1. Interval resolution of the 10 x 12 x 15 mm medial left lower lobe pulmonary nodule seen on the previous study. 2. Other small bilateral pulmonary nodules are stable in the interval. 3. No new or progressive findings to suggest recurrent or metastatic disease in the chest. 4. Aortic Atherosclerosis (ICD10-I70.0) and Emphysema (ICD10-J43.9).            Assessment

## 2022-08-14 NOTE — Assessment & Plan Note (Signed)
Could not tolerate off inderal due to shaking but says def breathing better.   >>> rec titrate to lowest effective dose of inderal, perhaps with clonazepam on board might help but defer to PCP         Each maintenance medication was reviewed in detail including emphasizing most importantly the difference between maintenance and prns and under what circumstances the prns are to be triggered using an action plan format where appropriate.  Total time for H and P, chart review, counseling, reviewing hfa/02/neb device(s) and generating customized AVS unique to this office visit / same day charting = 32 min

## 2022-08-14 NOTE — Patient Instructions (Addendum)
Please remember to go to the lab department   for your tests - we will call you with the results when they are available.  Work on inhaler technique:  relax and gently blow all the way out then take a nice smooth full deep breath back in, triggering the inhaler at same time you start breathing in.  Hold breath in for at least  5 seconds if you can. Blow out breztri  thru nose. Rinse and gargle with water when done.  If mouth or throat bother you at all,  try brushing teeth/gums/tongue with arm and hammer toothpaste/ make a slurry and gargle and spit out.      My office will be contacting you by phone for referral to pulmonary rehab  - if you don't hear back from my office within one week please call us back or notify us thru MyChart and we'll address it right away.   Please schedule a follow up visit in 6  months but call sooner if needed   .

## 2022-08-14 NOTE — Assessment & Plan Note (Signed)
Quit smoking 2019 - PFT's  12/14/19 FEV1 1.02 (40 % ) ratio 0.39  p 20 % improvement from saba p ? prior to study with DLCO  10.06 (48%) corrects to 2.32 (55%)  for alv volume and FV curve classic concave exp loop - 06/29/2021  After extensive coaching inhaler device,  effectiveness =    90% with dpi, 75% with hfa   - 06/29/2021   Walked on RA x  3  lap(s) =  approx 750 ft @ mod fast pace, stopped due to end of study, sob  with lowest 02 sats 93% -  03/07/2022  After extensive coaching inhaler device,  effectiveness =    60% > continue breztri 2 bid plus pred 20 mg until better then 10 mg daily and f/u in 4 weeks - 04/23/2022 rec change propranolol to bisoprolol  - Labs ordered 08/14/2022  :    alpha one AT phenotype   - 08/14/2022  After extensive coaching inhaler device,  effectiveness =   75%     Group D (now reclassified as E) in terms of symptom/risk and laba/lama/ICS  therefore appropriate rx at this point >>>  continue breztri and approp saba and referred back to pulmonary rehab

## 2022-08-15 ENCOUNTER — Other Ambulatory Visit: Payer: Self-pay | Admitting: Adult Health

## 2022-08-16 ENCOUNTER — Ambulatory Visit: Payer: Medicare Other | Admitting: Adult Health

## 2022-08-16 ENCOUNTER — Telehealth: Payer: Self-pay | Admitting: Adult Health

## 2022-08-16 NOTE — Telephone Encounter (Signed)
This was taking care of. °

## 2022-08-16 NOTE — Telephone Encounter (Signed)
Pt said a nurse suppose to call her back concerning vaccine . Pt has an appt today at 230 pm

## 2022-08-18 ENCOUNTER — Other Ambulatory Visit: Payer: Self-pay | Admitting: Adult Health

## 2022-08-18 LAB — ALPHA-1-ANTITRYPSIN PHENOTYP: A-1 Antitrypsin: 149 mg/dL (ref 101–187)

## 2022-08-22 ENCOUNTER — Other Ambulatory Visit: Payer: Self-pay

## 2022-08-22 ENCOUNTER — Other Ambulatory Visit: Payer: Self-pay | Admitting: Adult Health

## 2022-08-22 MED ORDER — CLONAZEPAM 0.5 MG PO TABS: 0.5000 mg | ORAL_TABLET | Freq: Every day | ORAL | 2 refills | Status: DC

## 2022-08-30 ENCOUNTER — Telehealth (HOSPITAL_COMMUNITY): Payer: Self-pay

## 2022-08-30 NOTE — Telephone Encounter (Signed)
Called to confirm appt. Pt confirmed appt. Instructed pt on proper footwear and COVID screening. Gave directions along with department number.   

## 2022-08-31 ENCOUNTER — Encounter (HOSPITAL_COMMUNITY): Payer: Self-pay

## 2022-08-31 ENCOUNTER — Encounter (HOSPITAL_COMMUNITY)
Admission: RE | Admit: 2022-08-31 | Discharge: 2022-08-31 | Disposition: A | Discharge status: Home / Self Care | Payer: Medicare Other | Source: Ambulatory Visit | Attending: Internal Medicine | Admitting: Internal Medicine

## 2022-08-31 VITALS — BP 138/80 | HR 67 | Ht 64.0 in | Wt 153.9 lb

## 2022-08-31 DIAGNOSIS — J449 Chronic obstructive pulmonary disease, unspecified: Secondary | ICD-10-CM | POA: Insufficient documentation

## 2022-08-31 NOTE — Progress Notes (Signed)
[name] 66 y.o. female Pulmonary Rehab Orientation Note This patient who was referred to Pulmonary Rehab by Dr. Melvyn Novas with the diagnosis of COPD 3 arrived today in Cardiac and Pulmonary Rehab. She  arrived ambulatory with normal gait. She  does not carry portable oxygen. [employer] is the provider for their DME, but she only uses it for emergencies/PRN. Per patient, [name] uses oxygen rarely. Color good, skin warm and dry. Patient is oriented to time and place. Patient's medical history, psychosocial health, and medications reviewed. Psychosocial assessment reveals patient lives with a roommate. [name] is currently retired. Patient hobbies include watching tv and spending time with others. Patient reports her stress level is high. Areas of stress/anxiety include finances. Patient does exhibit signs of depression. Signs of depression include anxiety and fatigue. PHQ2/9 score 0/3. Tyleigh shows good  coping skills with positive outlook on life. Offered emotional support and reassurance. Will continue to monitor. Physical assessment reveals heart rate is normal, breath sounds diminished to auscultation, no wheezes, rales, or rhonchi. Grip strength equal, strong. Distal pulses present. [name] reports she does take medications as prescribed. Patient states she follows a regular  diet. The patient reports no specific efforts to gain or lose weight.. Pt's weight will be monitored closely. Demonstration and practice of PLB using pulse oximeter. Asmi able to return demonstration satisfactorily. Safety and hand hygiene in the exercise area reviewed with patient. [name] voices understanding of the information reviewed. Department expectations discussed with patient and achievable goals were set. The patient shows enthusiasm about attending the program and we look forward to working with [name]. Bethsaida completed a 6 min walk test today and is scheduled to begin exercise on 12/7 at 1315.   [place]

## 2022-08-31 NOTE — Progress Notes (Signed)
[name] 66 y.o. female  Initial Psychosocial Assessment  Pt psychosocial assessment reveals pt lives with an adult companion. Pt is currently retired. Pt hobbies include watching tv and spending time with others. Pt reports her stress level is high. Areas of stress/anxiety include Finances.  Pt does not exhibit signs of depression. Pt is currently on medication for anxiety and depression. Pt states that it is well controlled. Pt shows good  coping skills with positive outlook . Offered emotional support and reassurance. Will continue to monitor.   08/31/2022 3:24 PM

## 2022-08-31 NOTE — Progress Notes (Signed)
Pulmonary Individual Treatment Plan  Patient Details  Name: [name] MRN: [id number] Date of Birth: [birth year] Referring Provider:   [name] Pulmonary Rehab Walk Test from 08/31/2022 in [facility] for Heart, Vascular, & [place]  Referring Provider Wert       Initial Encounter Date:  Flowsheet Row Pulmonary Rehab Walk Test from 08/31/2022 in [facility] for Heart, Vascular, & Lung Health  Date 08/31/22       Visit Diagnosis: Stage 3 severe COPD by GOLD classification ([place])  Patient's Home Medications on Admission:   Current Outpatient Medications:    albuterol (PROVENTIL) (2.5 MG/3ML) 0.083% nebulizer solution, Take 3 mLs (2.5 mg total) by nebulization every 4 (four) hours as needed for wheezing or shortness of breath., Disp: 75 mL, Rfl: 12   albuterol (VENTOLIN HFA) 108 (90 Base) MCG/ACT inhaler, Inhale 2 puffs into the lungs every 6 (six) hours as needed for wheezing or shortness of breath., Disp: 8 g, Rfl: 0   Budeson-Glycopyrrol-Formoterol (BREZTRI AEROSPHERE) 160-9-4.8 MCG/ACT AERO, Inhale 2 puffs into the lungs 2 (two) times daily., Disp: 10.7 g, Rfl: 11   clonazePAM (KLONOPIN) 0.5 MG tablet, Take 1 tablet (0.5 mg total) by mouth daily. for anxiety, Disp: 90 tablet, Rfl: 2   fluticasone (FLONASE) 50 MCG/ACT nasal spray, Place 1 spray into both nostrils daily., Disp: 18.2 mL, Rfl: 2   guaiFENesin (MUCINEX) 600 MG 12 hr tablet, Take 600 mg by mouth 2 (two) times daily as needed for cough or to loosen phlegm., Disp: , Rfl:    pantoprazole (PROTONIX) 40 MG tablet, Take 1 tablet by mouth once daily, Disp: 90 tablet, Rfl: 0   PARoxetine (PAXIL) 40 MG tablet, Take 1 tablet (40 mg total) by mouth daily., Disp: 90 tablet, Rfl: 1   propranolol ER (INDERAL LA) 60 MG 24 hr capsule, Take 1 capsule (60 mg total) by mouth daily., Disp: 90 capsule, Rfl: 3   traZODone (DESYREL) 100 MG tablet, Take 1 tablet (100 mg total) by mouth at  bedtime as needed for sleep., Disp: 90 tablet, Rfl: 1  Past Medical History: Past Medical History:  Diagnosis Date   Anxiety    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    Hepatitis C    lung ca dx'd 03/2018   Tremor, unspecified    non-specific; takes inderal.     Tobacco Use: Social History   Tobacco Use  Smoking Status Former   Packs/day: 1.00   Years: 40.00   Total pack years: 40.00   Types: Cigarettes   Quit date: 12/14/2017   Years since quitting: 4.7   Passive exposure: Never  Smokeless Tobacco Never    Labs: Review Flowsheet  More data exists      Latest Ref Rng & Units 08/11/2018 09/04/2019 12/18/2019 12/19/2019 09/18/2021  Labs for ITP Cardiac and Pulmonary Rehab  Cholestrol <200 mg/dL 245  242  - - -  LDL (calc) mg/dL (calc) - 149  - - -  Direct LDL mg/dL 173.0  - - - -  HDL-C > OR = 50 mg/dL 53.40  50  - - -  Trlycerides <150 mg/dL 342.0  260  - - -  Hemoglobin A1c <5.7 % of  total Hgb - 5.3  - - -  PH, Arterial 7.350 - 7.450 - - 7.362  7.389  -  PCO2 arterial 32.0 - 48.0 mmHg - - 45.4  40.7  -  Bicarbonate 20.0 - 28.0 mmol/L - - 25.1  24.0  23.1   Acid-base deficit 0.0 - 2.0 mmol/L - - - 0.3  0.3   O2 Saturation % - - 99.0  95.6  87.4     Capillary Blood Glucose: Lab Results  Component Value Date   GLUCAP 107 (H) 03/02/2022   GLUCAP 105 (H) 12/03/2019   GLUCAP 109 (H) 02/19/2018     Pulmonary Assessment Scores:  Pulmonary Assessment Scores     Row Name 08/31/22 1348         ADL UCSD   SOB Score total 36       CAT Score   CAT Score 16       mMRC Score   mMRC Score 1             UCSD: Self-administered rating of dyspnea associated with activities of daily living (ADLs) 6-point scale (0 = "not at all" to 5 = "maximal or unable to  do because of breathlessness")  Scoring Scores range from 0 to 120.  Minimally important difference is 5 units  CAT: CAT can identify the health impairment of COPD patients and is better correlated with disease progression.  CAT has a scoring range of zero to 40. The CAT score is classified into four groups of low (less than 10), medium (10 - 20), high (21-30) and very high (31-40) based on the impact level of disease on health status. A CAT score over 10 suggests significant symptoms.  A worsening CAT score could be explained by an exacerbation, poor medication adherence, poor inhaler technique, or progression of COPD or comorbid conditions.  CAT MCID is 2 points  mMRC: mMRC (Modified Medical Research Council) Dyspnea Scale is used to assess the degree of baseline functional disability in patients of respiratory disease due to dyspnea. No minimal important difference is established. A decrease in score of 1 point or greater is considered a positive change.   Pulmonary Function Assessment:  Pulmonary Function Assessment - 08/31/22 1348       Breath   Bilateral Breath Sounds Decreased    Shortness of Breath Yes;Limiting activity             Exercise Target Goals: Exercise Program Goal: Individual exercise prescription set using results from initial 6 min walk test and THRR while considering  patient's activity barriers and safety.   Exercise Prescription Goal: Initial exercise prescription builds to 30-45 minutes a day of aerobic activity, 2-3 days per week.  Home exercise guidelines will be given to patient during program as part of exercise prescription that the participant will acknowledge.  Activity Barriers & Risk Stratification:  Activity Barriers & Cardiac Risk Stratification - 08/31/22 1343       Activity Barriers & Cardiac Risk Stratification   Activity Barriers Arthritis;Back Problems;Deconditioning;Muscular Weakness;Shortness of Breath             6 Minute  Walk:  6 Minute Walk     Row Name 08/31/22 1504         6 Minute Walk   Phase Initial     Distance 1150 feet     Walk Time 6 minutes     # of Rest Breaks 2  2:20-2:26, 2:55-3:22     MPH 2.18  METS 2.81     RPE 13     Perceived Dyspnea  2.5     VO2 Peak 9.83     Symptoms No     Resting HR 67 bpm     Resting BP 138/80     Resting Oxygen Saturation  96 %     Exercise Oxygen Saturation  during 6 min walk 89 %     Max Ex. HR 89 bpm     Max Ex. BP 148/80     2 Minute Post BP 128/80       Interval HR   1 Minute HR 79     2 Minute HR 82     3 Minute HR 82     4 Minute HR 85     5 Minute HR 88     6 Minute HR 89     2 Minute Post HR 68     Interval Heart Rate? Yes       Interval Oxygen   Interval Oxygen? Yes     Baseline Oxygen Saturation % 96 %     1 Minute Oxygen Saturation % 92 %     1 Minute Liters of Oxygen 0 L     2 Minute Oxygen Saturation % 90 %     2 Minute Liters of Oxygen 0 L     3 Minute Oxygen Saturation % 90 %     3 Minute Liters of Oxygen 0 L     4 Minute Oxygen Saturation % 90 %     4 Minute Liters of Oxygen 0 L     5 Minute Oxygen Saturation % 89 %     5 Minute Liters of Oxygen 0 L     6 Minute Oxygen Saturation % 89 %     6 Minute Liters of Oxygen 0 L     2 Minute Post Oxygen Saturation % 96 %     2 Minute Post Liters of Oxygen 0 L              Oxygen Initial Assessment:  Oxygen Initial Assessment - 08/31/22 1343       Home Oxygen   Home Oxygen Device Portable Concentrator;Home Concentrator;E-Tanks   Pt has oxygen equip for emergency situations and PRN   Sleep Oxygen Prescription None    Home Exercise Oxygen Prescription None    Home Resting Oxygen Prescription None      Initial 6 min Walk   Oxygen Used None      Program Oxygen Prescription   Program Oxygen Prescription None      Intervention   Short Term Goals To learn and exhibit compliance with exercise, home and travel O2 prescription;To learn and understand importance of  monitoring SPO2 with pulse oximeter and demonstrate accurate use of the pulse oximeter.;To learn and understand importance of maintaining oxygen saturations>88%;To learn and demonstrate proper pursed lip breathing techniques or other breathing techniques. ;To learn and demonstrate proper use of respiratory medications    Long  Term Goals Exhibits compliance with exercise, home  and travel O2 prescription;Verbalizes importance of monitoring SPO2 with pulse oximeter and return demonstration;Maintenance of O2 saturations>88%;Compliance with respiratory medication;Exhibits proper breathing techniques, such as pursed lip breathing or other method taught during program session;Demonstrates proper use of MDI's             Oxygen Re-Evaluation:   Oxygen Discharge (Final Oxygen Re-Evaluation):   Initial Exercise Prescription:  Initial Exercise Prescription - 08/31/22 1500  Date of Initial Exercise RX and Referring Provider   Date 08/31/22    Referring Provider Wert    Expected Discharge Date 11/08/22      NuStep   Level 1    SPM 80    Minutes 15    METs 2      Track   Minutes 15    METs 1.93      Prescription Details   Frequency (times per week) 2    Duration Progress to 30 minutes of continuous aerobic without signs/symptoms of physical distress      Intensity   THRR 40-80% of Max Heartrate 61-122    Ratings of Perceived Exertion 11-13    Perceived Dyspnea 0-4      Progression   Progression Continue to progress workloads to maintain intensity without signs/symptoms of physical distress.      Resistance Training   Training Prescription Yes    Weight blue bands    Reps 10-15             Perform Capillary Blood Glucose checks as needed.  Exercise Prescription Changes:   Exercise Comments:   Exercise Goals and Review:   Exercise Goals     Row Name 08/31/22 1506             Exercise Goals   Increase Physical Activity Yes       Intervention Provide  advice, education, support and counseling about physical activity/exercise needs.;Develop an individualized exercise prescription for aerobic and resistive training based on initial evaluation findings, risk stratification, comorbidities and participant's personal goals.       Expected Outcomes Short Term: Attend rehab on a regular basis to increase amount of physical activity.;Long Term: Exercising regularly at least 3-5 days a week.;Long Term: Add in home exercise to make exercise part of routine and to increase amount of physical activity.       Increase Strength and Stamina Yes       Intervention Provide advice, education, support and counseling about physical activity/exercise needs.;Develop an individualized exercise prescription for aerobic and resistive training based on initial evaluation findings, risk stratification, comorbidities and participant's personal goals.       Expected Outcomes Short Term: Increase workloads from initial exercise prescription for resistance, speed, and METs.;Short Term: Perform resistance training exercises routinely during rehab and add in resistance training at home;Long Term: Improve cardiorespiratory fitness, muscular endurance and strength as measured by increased METs and functional capacity (6MWT)       Able to understand and use rate of perceived exertion (RPE) scale Yes       Intervention Provide education and explanation on how to use RPE scale       Expected Outcomes Short Term: Able to use RPE daily in rehab to express subjective intensity level;Long Term:  Able to use RPE to guide intensity level when exercising independently       Able to understand and use Dyspnea scale Yes       Intervention Provide education and explanation on how to use Dyspnea scale       Expected Outcomes Short Term: Able to use Dyspnea scale daily in rehab to express subjective sense of shortness of breath during exertion;Long Term: Able to use Dyspnea scale to guide intensity  level when exercising independently       Knowledge and understanding of Target Heart Rate Range (THRR) Yes       Intervention Provide education and explanation of THRR including how the numbers were predicted  and where they are located for reference       Expected Outcomes Short Term: Able to state/look up THRR;Long Term: Able to use THRR to govern intensity when exercising independently;Short Term: Able to use daily as guideline for intensity in rehab       Understanding of Exercise Prescription Yes       Intervention Provide education, explanation, and written materials on patient's individual exercise prescription       Expected Outcomes Short Term: Able to explain program exercise prescription;Long Term: Able to explain home exercise prescription to exercise independently                Exercise Goals Re-Evaluation :   Discharge Exercise Prescription (Final Exercise Prescription Changes):   Nutrition:  Target Goals: Understanding of nutrition guidelines, daily intake of sodium <1528m, cholesterol <2042m calories 30% from fat and 7% or less from saturated fats, daily to have 5 or more servings of fruits and vegetables.  Biometrics:   Post Biometrics - 08/31/22 1328        Post  Biometrics   Grip Strength 22 kg             Nutrition Therapy Plan and Nutrition Goals:   Nutrition Assessments:  MEDIFICTS Score Key: ?70 Need to make dietary changes  40-70 Heart Healthy Diet ? 40 Therapeutic Level Cholesterol Diet  Flowsheet Row PULMONARY REHAB CHRONIC OBSTRUCTIVE PULMONARY DISEASE [facility]  Picture Your Plate Total Score on Admission 58      Picture Your Plate Scores: <4<62nhealthy dietary pattern with much room for improvement. 41-50 Dietary pattern unlikely to meet recommendations for good health and room for improvement. 51-60 More healthful dietary pattern, with some room for  improvement.  >60 Healthy dietary pattern, although there may be some specific behaviors that could be improved.    Nutrition Goals Re-Evaluation:   Nutrition Goals Discharge (Final Nutrition Goals Re-Evaluation):   Psychosocial: Target Goals: Acknowledge presence or absence of significant depression and/or stress, maximize coping skills, provide positive support system. Participant is able to verbalize types and ability to use techniques and skills needed for reducing stress and depression.  Initial Review & Psychosocial Screening:  Initial Psych Review & Screening - 08/31/22 1338       Initial Review   Current issues with Current Depression;Current Anxiety/Panic;Current Psychotropic Meds   Pt currently on medication for anxiety and depression     Family Dynamics   Good Support System? Yes    Comments Pt has friends, family and roommate for support      Barriers   Psychosocial barriers to participate in program There are no identifiable barriers or psychosocial needs.      Screening Interventions   Interventions Encouraged to exercise             Quality of Life Scores:  Scores of 19 and below usually indicate a poorer quality of life in these areas.  A difference of  2-3 points is a clinically meaningful difference.  A difference of 2-3 points in the total score of the Quality of Life Index has been associated with significant improvement in overall quality of life, self-image, physical symptoms, and general health in studies assessing change in quality of life.  PHQ-9: Review Flowsheet  More data exists      08/31/2022 05/23/2022 02/21/2022 01/03/2022 09/27/2021  Depression screen PHQ 2/9  Decreased Interest 0 3 0 0 3  Down,  Depressed, Hopeless 0 3 0 0 0  PHQ - 2 Score 0 6 0 0 3  Altered sleeping 2 0 1 0 2  Tired, decreased energy _0 0 3  Change in appetite 0 0 0 0 0  Feeling bad or failure about yourself  0 1 0 0 0  Trouble concentrating 0 0 0 0 0  Moving  slowly or fidgety/restless 0 0 0 0 0  Suicidal thoughts 0 0 0 0 0  PHQ-9 Score _1 0 8  Difficult doing work/chores Not difficult at all Somewhat difficult Not difficult at all Not difficult at all -   Interpretation of Total Score  Total Score Depression Severity:  1-4 = Minimal depression, 5-9 = Mild depression, 10-14 = Moderate depression, 15-19 = Moderately severe depression, 20-27 = Severe depression   Psychosocial Evaluation and Intervention:  Psychosocial Evaluation - 08/31/22 1340       Psychosocial Evaluation & Interventions   Comments Adaja is currently being treated for anxiety and depression, she has no other psychosocial barriers at this time    Expected Outcomes Rebacca will progress in the PR program without any psychosocial barriers    Continue Psychosocial Services  No Follow up required             Psychosocial Re-Evaluation:   Psychosocial Discharge (Final Psychosocial Re-Evaluation):   Education: Education Goals: Education classes will be provided on a weekly basis, covering required topics. Participant will state understanding/return demonstration of topics presented.  Learning Barriers/Preferences:  Learning Barriers/Preferences - 08/31/22 1341       Learning Barriers/Preferences   Learning Barriers None    Learning Preferences Written Material;Video;Verbal Instruction;Audio;Computer/Internet;Group Instruction;Individual Instruction;Pictoral;Skilled Demonstration             Education Topics: Introduction to Pulmonary Rehab Group instruction provided by PowerPoint, verbal discussion, and written material to support subject matter. Instructor reviews what Pulmonary Rehab is, the purpose of the program, and how patients are referred.     Know Your Numbers Group instruction that is supported by a PowerPoint presentation. Instructor discusses importance of knowing and understanding resting, exercise, and post-exercise oxygen saturation, heart  rate, and blood pressure. Oxygen saturation, heart rate, blood pressure, rating of perceived exertion, and dyspnea are reviewed along with a normal range for these values.    Exercise for the Pulmonary Patient Group instruction that is supported by a PowerPoint presentation. Instructor discusses benefits of exercise, core components of exercise, frequency, duration, and intensity of an exercise routine, importance of utilizing pulse oximetry during exercise, safety while exercising, and options of places to exercise outside of rehab.  Flowsheet Row PULMONARY REHAB CHRONIC OBSTRUCTIVE PULMONARY DISEASE from 02/01/2022 in [facility] for Heart, Vascular, & Lung Health  Date 02/01/22  Educator Donnetta Simpers  Instruction Review Code 1- Verbalizes Understanding          MET Level  Group instruction provided by PowerPoint, verbal discussion, and written material to support subject matter. Instructor reviews what METs are and how to increase METs.    Pulmonary Medications Verbally interactive group education provided by instructor with focus on inhaled medications and proper administration.   Anatomy and Physiology of the Respiratory System Group instruction provided by PowerPoint, verbal discussion, and written material to support subject matter. Instructor reviews respiratory cycle and anatomical components of the respiratory system and their functions. Instructor also reviews differences in obstructive and restrictive respiratory diseases with examples of each.    Oxygen Safety Group instruction  provided by PowerPoint, verbal discussion, and written material to support subject matter. There is an overview of "What is Oxygen" and "Why do we need it".  Instructor also reviews how to create a safe environment for oxygen use, the importance of using oxygen as prescribed, and the risks of noncompliance. There is a brief discussion on traveling with oxygen and resources the patient  may utilize.   Oxygen Use Group instruction provided by PowerPoint, verbal discussion, and written material to discuss how supplemental oxygen is prescribed and different types of oxygen supply systems. Resources for more information are provided.    Breathing Techniques Group instruction that is supported by demonstration and informational handouts. Instructor discusses the benefits of pursed lip and diaphragmatic breathing and detailed demonstration on how to perform both.     Risk Factor Reduction Group instruction that is supported by a PowerPoint presentation. Instructor discusses the definition of a risk factor, different risk factors for pulmonary disease, and how the heart and lungs work together.   MD Day A group question and answer session with a medical doctor that allows participants to ask questions that relate to their pulmonary disease state.   Nutrition for the Pulmonary Patient Group instruction provided by PowerPoint slides, verbal discussion, and written materials to support subject matter. The instructor gives an explanation and review of healthy diet recommendations, which includes a discussion on weight management, recommendations for fruit and vegetable consumption, as well as protein, fluid, caffeine, fiber, sodium, sugar, and alcohol. Tips for eating when patients are short of breath are discussed.    Other Education Group or individual verbal, written, or video instructions that support the educational goals of the pulmonary rehab program. Flowsheet Row PULMONARY REHAB CHRONIC OBSTRUCTIVE PULMONARY DISEASE from 01/25/2022 in [facility] for Heart, Vascular, & [place]  Date 01/11/22  Patrice Paradise to PR]  Educator Deanne Coffer your numbers]  Instruction Review Code 1- Verbalizes Understanding        Knowledge Questionnaire Score:  Knowledge Questionnaire Score - 08/31/22 1349       Knowledge Questionnaire Score   Pre Score 18/18              Core Components/Risk Factors/Patient Goals at Admission:  Personal Goals and Risk Factors at Admission - 08/31/22 1342       Core Components/Risk Factors/Patient Goals on Admission    Weight Management Weight Maintenance    Improve shortness of breath with ADL's Yes    Intervention Provide education, individualized exercise plan and daily activity instruction to help decrease symptoms of SOB with activities of daily living.    Expected Outcomes Short Term: Improve cardiorespiratory fitness to achieve a reduction of symptoms when performing ADLs;Long Term: Be able to perform more ADLs without symptoms or delay the onset of symptoms             Core Components/Risk Factors/Patient Goals Review:    Core Components/Risk Factors/Patient Goals at Discharge (Final Review):    ITP Comments:   Comments: Dr. Rodman Pickle is Medical Director for Pulmonary Rehab at [facility].

## 2022-09-04 ENCOUNTER — Other Ambulatory Visit: Payer: Self-pay | Admitting: Adult Health

## 2022-09-04 DIAGNOSIS — F32A Depression, unspecified: Secondary | ICD-10-CM

## 2022-09-06 ENCOUNTER — Encounter (HOSPITAL_COMMUNITY)
Admission: RE | Admit: 2022-09-06 | Discharge: 2022-09-06 | Disposition: A | Discharge status: Home / Self Care | Payer: Medicare Other | Source: Ambulatory Visit | Attending: Internal Medicine | Admitting: Internal Medicine

## 2022-09-06 DIAGNOSIS — J449 Chronic obstructive pulmonary disease, unspecified: Secondary | ICD-10-CM | POA: Diagnosis not present

## 2022-09-07 NOTE — Progress Notes (Signed)
Daily Session Note  Patient Details  Name: [name] MRN: [id number] Date of Birth: 11-06-1955 Referring Provider:   [name] Pulmonary Rehab Walk Test from 08/31/2022 in [facility] for Heart, Vascular, & [place]  Referring Provider Wert       Encounter Date: 09/06/2022  Check In:  Session Check In - 09/06/22 1427       Check-In   Supervising physician immediately available to respond to emergencies [facility] - Physician supervision    Physician(s) [place]    Location [facility]    Staff Present Elmon Else, MS, ACSM-CEP, Exercise Physiologist;Other;Zianna Dercole Yevonne Pax, ACSM-CEP, Exercise Physiologist    Virtual Visit No    Medication changes reported     No    Fall or balance concerns reported    No    Tobacco Cessation No Change    Warm-up and Cool-down Performed as group-led instruction    Resistance Training Performed Yes    VAD Patient? No    PAD/SET Patient? No      Pain Assessment   Currently in Pain? Yes    Pain Score 1     Pain Location Back    Pain Orientation Lower    Pain Descriptors / Indicators Aching    Pain Type Acute pain    Pain Onset Today    Pain Frequency Occasional    Multiple Pain Sites No             Capillary Blood Glucose: No results found for this or any previous visit (from the past 24 hour(s)).    Social History   Tobacco Use  Smoking Status Former   Packs/day: 1.00   Years: 40.00   Total pack years: 40.00   Types: Cigarettes   Quit date: 12/14/2017   Years since quitting: 4.7   Passive exposure: Never  Smokeless Tobacco Never    Goals Met:  Exercise tolerated well No report of concerns or symptoms today Strength training completed today  Goals Unmet:  Not Applicable  Comments: Pts first day of class.  Service time is from 1316 to 1346.    Dr. Rodman Pickle is Medical Director for Pulmonary Rehab at [facility].

## 2022-09-11 ENCOUNTER — Encounter (HOSPITAL_COMMUNITY)
Admission: RE | Admit: 2022-09-11 | Discharge: 2022-09-11 | Disposition: A | Discharge status: Home / Self Care | Payer: Medicare Other | Source: Ambulatory Visit | Attending: Internal Medicine | Admitting: Internal Medicine

## 2022-09-11 DIAGNOSIS — J449 Chronic obstructive pulmonary disease, unspecified: Secondary | ICD-10-CM

## 2022-09-11 DIAGNOSIS — Z23 Encounter for immunization: Secondary | ICD-10-CM | POA: Diagnosis not present

## 2022-09-11 NOTE — Progress Notes (Signed)
Daily Session Note  Patient Details  Name: [name] MRN: [id number] Date of Birth: Nov 10, 1955 Referring Provider:   [name] Pulmonary Rehab Walk Test from 08/31/2022 in [facility] [place]  Referring Provider Wert       Encounter Date: 09/11/2022  Check In:  Session Check In - 09/11/22 1427       Check-In   Supervising physician immediately available to respond to emergencies [facility] - Physician supervision    Physician(s) Dr. Tacy Learn    Location [facility]    Staff Present Elmon Else, MS, ACSM-CEP, Exercise Physiologist;Lisa Ysidro Evert, RN;Randi Olen Cordial BS, ACSM-CEP, Exercise Physiologist;Other    Virtual Visit No    Medication changes reported     No    Fall or balance concerns reported    No    Tobacco Cessation No Change    Warm-up and Cool-down Performed as group-led instruction    Resistance Training Performed Yes    VAD Patient? No    PAD/SET Patient? No      Pain Assessment   Currently in Pain? No/denies             Capillary Blood Glucose: No results found for this or any previous visit (from the past 24 hour(s)).    Social History   Tobacco Use  Smoking Status Former   Packs/day: 1.00   Years: 40.00   Total pack years: 40.00   Types: Cigarettes   Quit date: 12/14/2017   Years since quitting: 4.7   Passive exposure: Never  Smokeless Tobacco Never    Goals Met:  Proper associated with RPD/PD & O2 Sat Independence with exercise equipment Exercise tolerated well No report of concerns or symptoms today Strength training completed today  Goals Unmet:  Not Applicable  Comments: Service time is from 1323 to 1445.    Dr. Rodman Pickle is Medical Director for Pulmonary Rehab at [facility].

## 2022-09-13 ENCOUNTER — Encounter (HOSPITAL_COMMUNITY)
Admission: RE | Admit: 2022-09-13 | Discharge: 2022-09-13 | Disposition: A | Discharge status: Home / Self Care | Payer: Medicare Other | Source: Ambulatory Visit | Attending: Internal Medicine | Admitting: Internal Medicine

## 2022-09-13 DIAGNOSIS — J449 Chronic obstructive pulmonary disease, unspecified: Secondary | ICD-10-CM | POA: Diagnosis not present

## 2022-09-13 NOTE — Progress Notes (Signed)
Daily Session Note  Patient Details  Name: [name] MRN: [id number] Date of Birth: [birth year] Referring Provider:   [name] Pulmonary Rehab Walk Test from 08/31/2022 in [facility], & [place]  Referring Provider Wert       Encounter Date: 09/13/2022  Check In:  Session Check In - 09/13/22 1524       Check-In   Supervising physician immediately available to respond to emergencies [facility] - Physician supervision    Physician(s) Dr. Tacy Learn    Location [facility]    Staff Present Elmon Else, MS, ACSM-CEP, Exercise Physiologist;Lisa Ysidro Evert, RN;Randi Olen Cordial BS, ACSM-CEP, Exercise Physiologist;Other    Virtual Visit No    Medication changes reported     No    Fall or balance concerns reported    No    Tobacco Cessation No Change    Warm-up and Cool-down Performed as group-led instruction    Resistance Training Performed Yes    VAD Patient? No    PAD/SET Patient? No      Pain Assessment   Currently in Pain? No/denies             Capillary Blood Glucose: No results found for this or any previous visit (from the past 24 hour(s)).    Social History   Tobacco Use  Smoking Status Former   Packs/day: 1.00   Years: 40.00   Total pack years: 40.00   Types: Cigarettes   Quit date: 12/14/2017   Years since quitting: 4.7   Passive exposure: Never  Smokeless Tobacco Never    Goals Met:  Independence with exercise equipment Exercise tolerated well No report of concerns or symptoms today Strength training completed today  Goals Unmet:  Not Applicable  Comments: Service time is from 1326 to [place]    Dr. Rodman Pickle is Medical Director for Pulmonary Rehab at [facility].

## 2022-09-18 ENCOUNTER — Encounter (HOSPITAL_COMMUNITY)
Admission: RE | Admit: 2022-09-18 | Discharge: 2022-09-18 | Disposition: A | Discharge status: Home / Self Care | Payer: Medicare Other | Source: Ambulatory Visit | Attending: Internal Medicine | Admitting: Internal Medicine

## 2022-09-18 VITALS — Wt 155.0 lb

## 2022-09-18 DIAGNOSIS — J449 Chronic obstructive pulmonary disease, unspecified: Secondary | ICD-10-CM

## 2022-09-18 NOTE — Progress Notes (Signed)
Daily Session Note  Patient Details  Name: [name] MRN: [id number] Date of Birth: 02-07-1956 Referring Provider:   [name] Pulmonary Rehab Walk Test from 08/31/2022 in [facility] for [facility], V[facility], & [place]  Referring Provider Wert       Encounter Date: 09/18/2022  Check In:  Session Check In - 09/18/22 1437       Check-In   Supervising physician immediately available to respond to emergencies [facility] - Physician supervision    Physician(s) Gala Murdoch    Location [facility]    Staff Present Elmon Else, MS, ACSM-CEP, Exercise Physiologist;Randi Yevonne Pax, ACSM-CEP, Exercise Physiologist;Other    Virtual Visit No    Medication changes reported     No    Fall or balance concerns reported    No    Tobacco Cessation No Change    Warm-up and Cool-down Performed as group-led instruction    Resistance Training Performed Yes    VAD Patient? No    PAD/SET Patient? No      Pain Assessment   Currently in Pain? No/denies    Pain Score 0-No pain    Multiple Pain Sites No             Capillary Blood Glucose: No results found for this or any previous visit (from the past 24 hour(s)).   Exercise Prescription Changes - 09/18/22 1500       Response to Exercise   Blood Pressure (Admit) 110/76    Blood Pressure (Exercise) 146/70    Blood Pressure (Exit) 130/70    Heart Rate (Admit) 62 bpm    Heart Rate (Exercise) 80 bpm    Heart Rate (Exit) 66 bpm    Oxygen Saturation (Admit) 96 %    Oxygen Saturation (Exercise) 92 %    Oxygen Saturation (Exit) 97 %    Rating of Perceived Exertion (Exercise) 9    Perceived Dyspnea (Exercise) 1    Duration Continue with 30 min of aerobic exercise without signs/symptoms of physical distress.    Intensity THRR unchanged      Progression   Progression Continue to progress workloads to maintain intensity without signs/symptoms of physical distress.      Resistance Training   Training  Prescription Yes    Weight blue bands    Reps 10-15    Time 10 Minutes      Treadmill   MPH 1    Grade 0    Minutes 15    METs 1.8      NuStep   Level 2    SPM 80    Minutes 15    METs 1.8             Social History   Tobacco Use  Smoking Status Former   Packs/day: 1.00   Years: 40.00   Total pack years: 40.00   Types: Cigarettes   Quit date: 12/14/2017   Years since quitting: 4.7   Passive exposure: Never  Smokeless Tobacco Never    Goals Met:  Proper associated with RPD/PD & O2 Sat Independence with exercise equipment Exercise tolerated well No report of concerns or symptoms today Strength training completed today  Goals Unmet:  Not Applicable  Comments: Service time is from 1320 to 1445.    Dr. Rodman Pickle is Medical Director for Pulmonary Rehab at [facility].

## 2022-09-19 NOTE — Progress Notes (Signed)
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 ([place])  Patient's Home Medications on Admission:   Current Outpatient Medications:    albuterol (PROVENTIL) (2.5 MG/3ML) 0.083% nebulizer solution, Take 3 mLs (2.5 mg total) by nebulization every 4 (four) hours as needed for wheezing or shortness of breath., Disp: 75 mL, Rfl: 12   albuterol (VENTOLIN HFA) 108 (90 Base) MCG/ACT inhaler, Inhale 2 puffs into the lungs every 6 (six) hours as needed for wheezing or shortness of breath., Disp: 8 g, Rfl: 0   Budeson-Glycopyrrol-Formoterol (BREZTRI AEROSPHERE) 160-9-4.8 MCG/ACT AERO, Inhale 2 puffs into the lungs 2 (two) times daily., Disp: 10.7 g, Rfl: 11   clonazePAM (KLONOPIN) 0.5 MG tablet, Take 1 tablet (0.5 mg total) by mouth daily. for anxiety, Disp: 90 tablet, Rfl: 2   fluticasone (FLONASE) 50 MCG/ACT nasal spray, Place 1 spray into both nostrils daily., Disp: 18.2 mL, Rfl: 2   guaiFENesin (MUCINEX) 600 MG 12 hr tablet, Take 600 mg by mouth 2 (two) times daily as needed for cough or to loosen phlegm., Disp: , Rfl:    pantoprazole (PROTONIX) 40 MG tablet, Take 1 tablet by mouth once daily, Disp: 90 tablet, Rfl: 0   PARoxetine (PAXIL) 40 MG tablet, Take 1 tablet (40 mg total) by mouth daily., Disp: 90 tablet, Rfl: 1   propranolol ER (INDERAL LA) 60 MG 24 hr capsule, Take 1 capsule (60 mg total) by mouth daily., Disp: 90 capsule, Rfl: 3   traZODone (DESYREL) 100 MG tablet, TAKE 1 TABLET BY MOUTH AT BEDTIME AS  NEEDED FOR SLEEP, Disp: 90 tablet, Rfl: 1  Past Medical History: Past Medical History:  Diagnosis Date   Anxiety    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    Hepatitis C    lung ca dx'd 03/2018   Tremor, unspecified    non-specific; takes inderal.     Tobacco Use: Social History   Tobacco Use  Smoking Status Former   Packs/day: 1.00   Years: 40.00   Total pack years: 40.00   Types: Cigarettes   Quit date: 12/14/2017   Years since quitting: 4.7   Passive exposure: Never  Smokeless Tobacco Never    Labs: Review Flowsheet  More data exists      Latest Ref Rng & Units 08/11/2018 09/04/2019 12/18/2019 12/19/2019 09/18/2021  Labs for ITP Cardiac and Pulmonary Rehab  Cholestrol <200 mg/dL 245  242  - - -  LDL (calc) mg/dL (calc) - 149  - - -  Direct LDL mg/dL 173.0  - - - -  HDL-C > OR = 50 mg/dL 53.40  50  - - -  Trlycerides <150 mg/dL 342.0  260  - - -  Hemoglobin A1c <5.7 % of total Hgb -  5.3  - - -  PH, Arterial 7.350 - 7.450 - - 7.362  7.389  -  PCO2 arterial 32.0 - 48.0 mmHg - - 45.4  40.7  -  Bicarbonate 20.0 - 28.0 mmol/L - - 25.1  24.0  23.1   Acid-base deficit 0.0 - 2.0 mmol/L - - - 0.3  0.3   O2 Saturation % - - 99.0  95.6  87.4     Capillary Blood Glucose: Lab Results  Component Value Date   GLUCAP 107 (H) 03/02/2022   GLUCAP 105 (H) 12/03/2019   GLUCAP 109 (H) 02/19/2018     Pulmonary Assessment Scores:  Pulmonary Assessment Scores     Row Name 08/31/22 1348         ADL UCSD   SOB Score total 36       CAT Score   CAT Score 16       mMRC Score   mMRC Score 1             UCSD: Self-administered rating of dyspnea associated with activities of daily living (ADLs) 6-point scale (0 = "not at all" to 5 = "maximal or unable to do because  of breathlessness")  Scoring Scores range from 0 to 120.  Minimally important difference is 5 units  CAT: CAT can identify the health impairment of COPD patients and is better correlated with disease progression.  CAT has a scoring range of zero to 40. The CAT score is classified into four groups of low (less than 10), medium (10 - 20), high (21-30) and very high (31-40) based on the impact level of disease on health status. A CAT score over 10 suggests significant symptoms.  A worsening CAT score could be explained by an exacerbation, poor medication adherence, poor inhaler technique, or progression of COPD or comorbid conditions.  CAT MCID is 2 points  mMRC: mMRC (Modified Medical Research Council) Dyspnea Scale is used to assess the degree of baseline functional disability in patients of respiratory disease due to dyspnea. No minimal important difference is established. A decrease in score of 1 point or greater is considered a positive change.   Pulmonary Function Assessment:  Pulmonary Function Assessment - 08/31/22 1348       Breath   Bilateral Breath Sounds Decreased    Shortness of Breath Yes;Limiting activity             Exercise Target Goals: Exercise Program Goal: Individual exercise prescription set using results from initial 6 min walk test and THRR while considering  patient's activity barriers and safety.   Exercise Prescription Goal: Initial exercise prescription builds to 30-45 minutes a day of aerobic activity, 2-3 days per week.  Home exercise guidelines will be given to patient during program as part of exercise prescription that the participant will acknowledge.  Activity Barriers & Risk Stratification:  Activity Barriers & Cardiac Risk Stratification - 08/31/22 1343       Activity Barriers & Cardiac Risk Stratification   Activity Barriers Arthritis;Back Problems;Deconditioning;Muscular Weakness;Shortness of Breath             6 Minute Walk:  6  Minute Walk     Row Name 08/31/22 1504         6 Minute Walk   Phase Initial     Distance 1150 feet     Walk Time 6 minutes     # of Rest Breaks 2  2:20-2:26, 2:55-3:22     MPH 2.18     METS  2.81     RPE 13     Perceived Dyspnea  2.5     VO2 Peak 9.83     Symptoms No     Resting HR 67 bpm     Resting BP 138/80     Resting Oxygen Saturation  96 %     Exercise Oxygen Saturation  during 6 min walk 89 %     Max Ex. HR 89 bpm     Max Ex. BP 148/80     2 Minute Post BP 128/80       Interval HR   1 Minute HR 79     2 Minute HR 82     3 Minute HR 82     4 Minute HR 85     5 Minute HR 88     6 Minute HR 89     2 Minute Post HR 68     Interval Heart Rate? Yes       Interval Oxygen   Interval Oxygen? Yes     Baseline Oxygen Saturation % 96 %     1 Minute Oxygen Saturation % 92 %     1 Minute Liters of Oxygen 0 L     2 Minute Oxygen Saturation % 90 %     2 Minute Liters of Oxygen 0 L     3 Minute Oxygen Saturation % 90 %     3 Minute Liters of Oxygen 0 L     4 Minute Oxygen Saturation % 90 %     4 Minute Liters of Oxygen 0 L     5 Minute Oxygen Saturation % 89 %     5 Minute Liters of Oxygen 0 L     6 Minute Oxygen Saturation % 89 %     6 Minute Liters of Oxygen 0 L     2 Minute Post Oxygen Saturation % 96 %     2 Minute Post Liters of Oxygen 0 L              Oxygen Initial Assessment:  Oxygen Initial Assessment - 08/31/22 1343       Home Oxygen   Home Oxygen Device Portable Concentrator;Home Concentrator;E-Tanks   Pt has oxygen equip for emergency situations and PRN   Sleep Oxygen Prescription None    Home Exercise Oxygen Prescription None    Home Resting Oxygen Prescription None      Initial 6 min Walk   Oxygen Used None      Program Oxygen Prescription   Program Oxygen Prescription None      Intervention   Short Term Goals To learn and exhibit compliance with exercise, home and travel O2 prescription;To learn and understand importance of monitoring  SPO2 with pulse oximeter and demonstrate accurate use of the pulse oximeter.;To learn and understand importance of maintaining oxygen saturations>88%;To learn and demonstrate proper pursed lip breathing techniques or other breathing techniques. ;To learn and demonstrate proper use of respiratory medications    Long  Term Goals Exhibits compliance with exercise, home  and travel O2 prescription;Verbalizes importance of monitoring SPO2 with pulse oximeter and return demonstration;Maintenance of O2 saturations>88%;Compliance with respiratory medication;Exhibits proper breathing techniques, such as pursed lip breathing or other method taught during program session;Demonstrates proper use of MDI's             Oxygen Re-Evaluation:  Oxygen Re-Evaluation     Row Name 09/17/22 1525  Program Oxygen Prescription   Program Oxygen Prescription None         Home Oxygen   Home Oxygen Device Portable Concentrator;Home Concentrator;E-Tanks  Pt has oxygen equip for emergency situations and PRN       Sleep Oxygen Prescription None       Home Exercise Oxygen Prescription None       Home Resting Oxygen Prescription None         Goals/Expected Outcomes   Short Term Goals To learn and exhibit compliance with exercise, home and travel O2 prescription;To learn and understand importance of monitoring SPO2 with pulse oximeter and demonstrate accurate use of the pulse oximeter.;To learn and understand importance of maintaining oxygen saturations>88%;To learn and demonstrate proper pursed lip breathing techniques or other breathing techniques. ;To learn and demonstrate proper use of respiratory medications       Long  Term Goals Exhibits compliance with exercise, home  and travel O2 prescription;Verbalizes importance of monitoring SPO2 with pulse oximeter and return demonstration;Maintenance of O2 saturations>88%;Compliance with respiratory medication;Exhibits proper breathing techniques, such as pursed  lip breathing or other method taught during program session;Demonstrates proper use of MDI's       Comments no change, not requiring O2 currently       Goals/Expected Outcomes Compliance and understanding of oxygen saturations monitoring and breathing techniques to decrease shortness of breath.                Oxygen Discharge (Final Oxygen Re-Evaluation):  Oxygen Re-Evaluation - 09/17/22 1525       Program Oxygen Prescription   Program Oxygen Prescription None      Home Oxygen   Home Oxygen Device Portable Concentrator;Home Concentrator;E-Tanks   Pt has oxygen equip for emergency situations and PRN   Sleep Oxygen Prescription None    Home Exercise Oxygen Prescription None    Home Resting Oxygen Prescription None      Goals/Expected Outcomes   Short Term Goals To learn and exhibit compliance with exercise, home and travel O2 prescription;To learn and understand importance of monitoring SPO2 with pulse oximeter and demonstrate accurate use of the pulse oximeter.;To learn and understand importance of maintaining oxygen saturations>88%;To learn and demonstrate proper pursed lip breathing techniques or other breathing techniques. ;To learn and demonstrate proper use of respiratory medications    Long  Term Goals Exhibits compliance with exercise, home  and travel O2 prescription;Verbalizes importance of monitoring SPO2 with pulse oximeter and return demonstration;Maintenance of O2 saturations>88%;Compliance with respiratory medication;Exhibits proper breathing techniques, such as pursed lip breathing or other method taught during program session;Demonstrates proper use of MDI's    Comments no change, not requiring O2 currently    Goals/Expected Outcomes Compliance and understanding of oxygen saturations monitoring and breathing techniques to decrease shortness of breath.             Initial Exercise Prescription:  Initial Exercise Prescription - 08/31/22 1500       Date of Initial  Exercise RX and Referring Provider   Date 08/31/22    Referring Provider Wert    Expected Discharge Date 11/08/22      NuStep   Level 1    SPM 80    Minutes 15    METs 2      Track   Minutes 15    METs 1.93      Prescription Details   Frequency (times per week) 2    Duration Progress to 30 minutes of continuous aerobic without signs/symptoms of physical  distress      Intensity   THRR 40-80% of Max Heartrate 61-122    Ratings of Perceived Exertion 11-13    Perceived Dyspnea 0-4      Progression   Progression Continue to progress workloads to maintain intensity without signs/symptoms of physical distress.      Resistance Training   Training Prescription Yes    Weight blue bands    Reps 10-15             Perform Capillary Blood Glucose checks as needed.  Exercise Prescription Changes:   Exercise Prescription Changes     Row Name 09/18/22 1500             Response to Exercise   Blood Pressure (Admit) 110/76       Blood Pressure (Exercise) 146/70       Blood Pressure (Exit) 130/70       Heart Rate (Admit) 62 bpm       Heart Rate (Exercise) 80 bpm       Heart Rate (Exit) 66 bpm       Oxygen Saturation (Admit) 96 %       Oxygen Saturation (Exercise) 92 %       Oxygen Saturation (Exit) 97 %       Rating of Perceived Exertion (Exercise) 9       Perceived Dyspnea (Exercise) 1       Duration Continue with 30 min of aerobic exercise without signs/symptoms of physical distress.       Intensity THRR unchanged         Progression   Progression Continue to progress workloads to maintain intensity without signs/symptoms of physical distress.         Resistance Training   Training Prescription Yes       Weight blue bands       Reps 10-15       Time 10 Minutes         Treadmill   MPH 1       Grade 0       Minutes 15       METs 1.8         NuStep   Level 2       SPM 80       Minutes 15       METs 1.8                Exercise Comments:    Exercise Comments     Row Name 09/07/22 1336           Exercise Comments Pt completed her first day of exercise. she is familiar with program and was comfortable on the equipment. Performed warm up and cooldown independently. Exercised on the nustep at level 1, 15 min, 1.6 METs. She then walked the track for 15 min, METs 1.77. Discussed METs with pt, good understanding. Will progress as tolerated.                Exercise Goals and Review:   Exercise Goals     Row Name 08/31/22 1506 09/17/22 1522           Exercise Goals   Increase Physical Activity Yes Yes      Intervention Provide advice, education, support and counseling about physical activity/exercise needs.;Develop an individualized exercise prescription for aerobic and resistive training based on initial evaluation findings, risk stratification, comorbidities and participant's personal goals. Provide advice, education, support and counseling about physical activity/exercise  needs.;Develop an individualized exercise prescription for aerobic and resistive training based on initial evaluation findings, risk stratification, comorbidities and participant's personal goals.      Expected Outcomes Short Term: Attend rehab on a regular basis to increase amount of physical activity.;Long Term: Exercising regularly at least 3-5 days a week.;Long Term: Add in home exercise to make exercise part of routine and to increase amount of physical activity. Short Term: Attend rehab on a regular basis to increase amount of physical activity.;Long Term: Exercising regularly at least 3-5 days a week.;Long Term: Add in home exercise to make exercise part of routine and to increase amount of physical activity.      Increase Strength and Stamina Yes Yes      Intervention Provide advice, education, support and counseling about physical activity/exercise needs.;Develop an individualized exercise prescription for aerobic and resistive training based on initial  evaluation findings, risk stratification, comorbidities and participant's personal goals. Provide advice, education, support and counseling about physical activity/exercise needs.;Develop an individualized exercise prescription for aerobic and resistive training based on initial evaluation findings, risk stratification, comorbidities and participant's personal goals.      Expected Outcomes Short Term: Increase workloads from initial exercise prescription for resistance, speed, and METs.;Short Term: Perform resistance training exercises routinely during rehab and add in resistance training at home;Long Term: Improve cardiorespiratory fitness, muscular endurance and strength as measured by increased METs and functional capacity (6MWT) Short Term: Increase workloads from initial exercise prescription for resistance, speed, and METs.;Short Term: Perform resistance training exercises routinely during rehab and add in resistance training at home;Long Term: Improve cardiorespiratory fitness, muscular endurance and strength as measured by increased METs and functional capacity (6MWT)      Able to understand and use rate of perceived exertion (RPE) scale Yes Yes      Intervention Provide education and explanation on how to use RPE scale Provide education and explanation on how to use RPE scale      Expected Outcomes Short Term: Able to use RPE daily in rehab to express subjective intensity level;Long Term:  Able to use RPE to guide intensity level when exercising independently Short Term: Able to use RPE daily in rehab to express subjective intensity level;Long Term:  Able to use RPE to guide intensity level when exercising independently      Able to understand and use Dyspnea scale Yes Yes      Intervention Provide education and explanation on how to use Dyspnea scale Provide education and explanation on how to use Dyspnea scale      Expected Outcomes Short Term: Able to use Dyspnea scale daily in rehab to express  subjective sense of shortness of breath during exertion;Long Term: Able to use Dyspnea scale to guide intensity level when exercising independently Short Term: Able to use Dyspnea scale daily in rehab to express subjective sense of shortness of breath during exertion;Long Term: Able to use Dyspnea scale to guide intensity level when exercising independently      Knowledge and understanding of Target Heart Rate Range (THRR) Yes Yes      Intervention Provide education and explanation of THRR including how the numbers were predicted and where they are located for reference Provide education and explanation of THRR including how the numbers were predicted and where they are located for reference      Expected Outcomes Short Term: Able to state/look up THRR;Long Term: Able to use THRR to govern intensity when exercising independently;Short Term: Able to use daily as guideline for  intensity in rehab Short Term: Able to state/look up THRR;Long Term: Able to use THRR to govern intensity when exercising independently;Short Term: Able to use daily as guideline for intensity in rehab      Understanding of Exercise Prescription Yes Yes      Intervention Provide education, explanation, and written materials on patient's individual exercise prescription Provide education, explanation, and written materials on patient's individual exercise prescription      Expected Outcomes Short Term: Able to explain program exercise prescription;Long Term: Able to explain home exercise prescription to exercise independently Short Term: Able to explain program exercise prescription;Long Term: Able to explain home exercise prescription to exercise independently               Exercise Goals Re-Evaluation :  Exercise Goals Re-Evaluation     [place] Name 09/17/22 1522             Exercise Goal Re-Evaluation   Exercise Goals Review Increase Physical Activity;Able to understand and use Dyspnea scale;Understanding of Exercise  Prescription;Increase Strength and Stamina;Knowledge and understanding of Target Heart Rate Range (THRR);Able to understand and use rate of perceived exertion (RPE) scale       Comments Donyae has completed 3 exercise sessions. She is motivated and tolerating well. She exercises for 15 min on the Nustep and treadmill . She averages 1.9 METs at level 3 on the Nustep and 1.8 METs on the treadmill at 1.0 mph. She had a bad breathing day last week therefore could not progress METs. She performs the warmup and cooldown standing without limitations. Will continue to monitor and progress as able.       Expected Outcomes Through exercise at rehab and home, the patient will decrease shortness of breath with daily activities and feel confident in carrying out an exercise regimen at home.                Discharge Exercise Prescription (Final Exercise Prescription Changes):  Exercise Prescription Changes - 09/18/22 1500       Response to Exercise   Blood Pressure (Admit) 110/76    Blood Pressure (Exercise) 146/70    Blood Pressure (Exit) 130/70    Heart Rate (Admit) 62 bpm    Heart Rate (Exercise) 80 bpm    Heart Rate (Exit) 66 bpm    Oxygen Saturation (Admit) 96 %    Oxygen Saturation (Exercise) 92 %    Oxygen Saturation (Exit) 97 %    Rating of Perceived Exertion (Exercise) 9    Perceived Dyspnea (Exercise) 1    Duration Continue with 30 min of aerobic exercise without signs/symptoms of physical distress.    Intensity THRR unchanged      Progression   Progression Continue to progress workloads to maintain intensity without signs/symptoms of physical distress.      Resistance Training   Training Prescription Yes    Weight blue bands    Reps 10-15    Time 10 Minutes      Treadmill   MPH 1    Grade 0    Minutes 15    METs 1.8      NuStep   Level 2    SPM 80    Minutes 15    METs 1.8             Nutrition:  Target Goals: Understanding of nutrition guidelines, daily intake  of sodium <1513m, cholesterol <2072m calories 30% from fat and 7% or less from saturated fats, daily to have 5 or  more servings of fruits and vegetables.  Biometrics:   Post Biometrics - 08/31/22 1328        Post  Biometrics   Grip Strength 22 kg             Nutrition Therapy Plan and Nutrition Goals:  Nutrition Therapy & Goals - 09/06/22 1508       Nutrition Therapy   Diet General Healthy Diet      Personal Nutrition Goals   Nutrition Goal Patient to improve diet quality by the using the plate method as a daily guide for meal planning to include lean protein/plant protein, fruits, vegetables, whole grains, and nonfat/low fat dairy as part of balanced diet    Comments Danetta has gained about 27# over the last year (125# on 09/07/2021); she had lost significant weight due to illness and the death of her husband. She would like to maintain her weight at this point and improve diet quality. She reports take-out/fast food regularly.      Intervention Plan   Intervention Prescribe, educate and counsel regarding individualized specific dietary modifications aiming towards targeted core components such as weight, hypertension, lipid management, diabetes, heart failure and other comorbidities.;Nutrition handout(s) given to patient.    Expected Outcomes Short Term Goal: Understand basic principles of dietary content, such as calories, fat, sodium, cholesterol and nutrients.;Long Term Goal: Adherence to prescribed nutrition plan.             Nutrition Assessments:  Nutrition Assessments - 09/06/22 1513       Rate Your Plate Scores   Pre Score 51            MEDIFICTS Score Key: ?70 Need to make dietary changes  40-70 Heart Healthy Diet ? 40 Therapeutic Level Cholesterol Diet  Flowsheet Row PULMONARY REHAB CHRONIC OBSTRUCTIVE PULMONARY DISEASE from 09/06/2022 in [facility] for Heart, Vascular, & Lung Health  Picture Your Plate Total Score on  Admission 51      Picture Your Plate Scores: <38 Unhealthy dietary pattern with much room for improvement. 41-50 Dietary pattern unlikely to meet recommendations for good health and room for improvement. 51-60 More healthful dietary pattern, with some room for improvement.  >60 Healthy dietary pattern, although there may be some specific behaviors that could be improved.    Nutrition Goals Re-Evaluation:  Nutrition Goals Re-Evaluation     [place] Name 09/06/22 1508             Goals   Current Weight 152 lb 5.4 oz (69.1 kg)       Comment labs WNL       Expected Outcome Carnell has gained about 27# over the last year (125# on 09/07/2021); she had lost significant weight due to illness and the death of her husband. She would like to maintain her weight at this point and improve diet quality. She reports take-out/fast food regularly. Trudee did previously start pulmonary rehab in spring of 2023 but was not able to complete the program at that time.                Nutrition Goals Discharge (Final Nutrition Goals Re-Evaluation):  Nutrition Goals Re-Evaluation - 09/06/22 1508       Goals   Current Weight 152 lb 5.4 oz (69.1 kg)    Comment labs WNL    Expected Outcome Alieah has gained about 27# over the last year (125# on 09/07/2021); she had lost significant weight due to illness and the death of her  husband. She would like to maintain her weight at this point and improve diet quality. She reports take-out/fast food regularly. Tacori did previously start pulmonary rehab in spring of 2023 but was not able to complete the program at that time.             Psychosocial: Target Goals: Acknowledge presence or absence of significant depression and/or stress, maximize coping skills, provide positive support system. Participant is able to verbalize types and ability to use techniques and skills needed for reducing stress and depression.  Initial Review & Psychosocial Screening:   Initial Psych Review & Screening - 08/31/22 1338       Initial Review   Current issues with Current Depression;Current Anxiety/Panic;Current Psychotropic Meds   Pt currently on medication for anxiety and depression     Family Dynamics   Good Support System? Yes    Comments Pt has friends, family and roommate for support      Barriers   Psychosocial barriers to participate in program There are no identifiable barriers or psychosocial needs.      Screening Interventions   Interventions Encouraged to exercise             Quality of Life Scores:  Scores of 19 and below usually indicate a poorer quality of life in these areas.  A difference of  2-3 points is a clinically meaningful difference.  A difference of 2-3 points in the total score of the Quality of Life Index has been associated with significant improvement in overall quality of life, self-image, physical symptoms, and general health in studies assessing change in quality of life.  PHQ-9: Review Flowsheet  More data exists      08/31/2022 05/23/2022 02/21/2022 01/03/2022 09/27/2021  Depression screen PHQ 2/9  Decreased Interest 0 3 0 0 3  Down, Depressed, Hopeless 0 3 0 0 0  PHQ - 2 Score 0 6 0 0 3  Altered sleeping 2 0 1 0 2  Tired, decreased energy _0 0 3  Change in appetite 0 0 0 0 0  Feeling bad or failure about yourself  0 1 0 0 0  Trouble concentrating 0 0 0 0 0  Moving slowly or fidgety/restless 0 0 0 0 0  Suicidal thoughts 0 0 0 0 0  PHQ-9 Score _1 0 8  Difficult doing work/chores Not difficult at all Somewhat difficult Not difficult at all Not difficult at all -   Interpretation of Total Score  Total Score Depression Severity:  1-4 = Minimal depression, 5-9 = Mild depression, 10-14 = Moderate depression, 15-19 = Moderately severe depression, 20-27 = Severe depression   Psychosocial Evaluation and Intervention:  Psychosocial Evaluation - 08/31/22 1340       Psychosocial Evaluation & Interventions    Comments Aislynn is currently being treated for anxiety and depression, she has no other psychosocial barriers at this time    Expected Outcomes Adalie will progress in the PR program without any psychosocial barriers    Continue Psychosocial Services  No Follow up required             Psychosocial Re-Evaluation:  Psychosocial Re-Evaluation     [place] Name 09/12/22 1113 09/12/22 1115           Psychosocial Re-Evaluation   Current issues with Current Depression;Current Anxiety/Panic;Current Psychotropic Meds Current Depression;Current Anxiety/Panic;Current Psychotropic Meds      Comments -- Anelis has just got started in the PR program. She has attended 2 session  thus far. She is very excited to be in the program. She talks about the lost of her and told me that she has a plan to go to her son's home for Christmas.We will continue to monitor for any psychosocial barriers but at this time she is doing well.      Expected Outcomes -- For her to continue to participate in the program without any psychosocial issues or concerns.      Interventions -- Encouraged to attend Pulmonary Rehabilitation for the exercise      Continue Psychosocial Services  -- No Follow up required               Psychosocial Discharge (Final Psychosocial Re-Evaluation):  Psychosocial Re-Evaluation - 09/12/22 1115       Psychosocial Re-Evaluation   Current issues with Current Depression;Current Anxiety/Panic;Current Psychotropic Meds    Comments Mehak has just got started in the PR program. She has attended 2 session thus far. She is very excited to be in the program. She talks about the lost of her and told me that she has a plan to go to her son's home for Christmas.We will continue to monitor for any psychosocial barriers but at this time she is doing well.    Expected Outcomes For her to continue to participate in the program without any psychosocial issues or concerns.    Interventions Encouraged to  attend Pulmonary Rehabilitation for the exercise    Continue Psychosocial Services  No Follow up required             Education: Education Goals: Education classes will be provided on a weekly basis, covering required topics. Participant will state understanding/return demonstration of topics presented.  Learning Barriers/Preferences:  Learning Barriers/Preferences - 08/31/22 1341       Learning Barriers/Preferences   Learning Barriers None    Learning Preferences Written Material;Video;Verbal Instruction;Audio;Computer/Internet;Group Instruction;Individual Instruction;Pictoral;Skilled Demonstration             Education Topics: Introduction to Pulmonary Rehab Group instruction provided by PowerPoint, verbal discussion, and written material to support subject matter. Instructor reviews what Pulmonary Rehab is, the purpose of the program, and how patients are referred.     Know Your Numbers Group instruction that is supported by a PowerPoint presentation. Instructor discusses importance of knowing and understanding resting, exercise, and post-exercise oxygen saturation, heart rate, and blood pressure. Oxygen saturation, heart rate, blood pressure, rating of perceived exertion, and dyspnea are reviewed along with a normal range for these values.    Exercise for the Pulmonary Patient Group instruction that is supported by a PowerPoint presentation. Instructor discusses benefits of exercise, core components of exercise, frequency, duration, and intensity of an exercise routine, importance of utilizing pulse oximetry during exercise, safety while exercising, and options of places to exercise outside of rehab.  Flowsheet Row PULMONARY REHAB CHRONIC OBSTRUCTIVE PULMONARY DISEASE from 02/01/2022 in [facility]  Date 02/01/22  Educator Donnetta Simpers  Instruction Review Code 1- Verbalizes Understanding          MET Level  Group  instruction provided by PowerPoint, verbal discussion, and written material to support subject matter. Instructor reviews what METs are and how to increase METs.    Pulmonary Medications Verbally interactive group education provided by instructor with focus on inhaled medications and proper administration.   Anatomy and Physiology of the Respiratory System Group instruction provided by PowerPoint, verbal discussion, and written material to support subject matter. Instructor reviews  respiratory cycle and anatomical components of the respiratory system and their functions. Instructor also reviews differences in obstructive and restrictive respiratory diseases with examples of each.    Oxygen Safety Group instruction provided by PowerPoint, verbal discussion, and written material to support subject matter. There is an overview of "What is Oxygen" and "Why do we need it".  Instructor also reviews how to create a safe environment for oxygen use, the importance of using oxygen as prescribed, and the risks of noncompliance. There is a brief discussion on traveling with oxygen and resources the patient may utilize.   Oxygen Use Group instruction provided by PowerPoint, verbal discussion, and written material to discuss how supplemental oxygen is prescribed and different types of oxygen supply systems. Resources for more information are provided.    Breathing Techniques Group instruction that is supported by demonstration and informational handouts. Instructor discusses the benefits of pursed lip and diaphragmatic breathing and detailed demonstration on how to perform both.     Risk Factor Reduction Group instruction that is supported by a PowerPoint presentation. Instructor discusses the definition of a risk factor, different risk factors for pulmonary disease, and how the heart and lungs work together. Flowsheet Row PULMONARY REHAB CHRONIC OBSTRUCTIVE PULMONARY DISEASE from 09/13/2022 in [facility] for Heart, Vascular, & [place]  Date 09/13/22  Educator EP  Instruction Review Code 1- Verbalizes Understanding       MD Day A group question and answer session with a medical doctor that allows participants to ask questions that relate to their pulmonary disease state.   Nutrition for the Pulmonary Patient Group instruction provided by PowerPoint slides, verbal discussion, and written materials to support subject matter. The instructor gives an explanation and review of healthy diet recommendations, which includes a discussion on weight management, recommendations for fruit and vegetable consumption, as well as protein, fluid, caffeine, fiber, sodium, sugar, and alcohol. Tips for eating when patients are short of breath are discussed.    Other Education Group or individual verbal, written, or video instructions that support the educational goals of the pulmonary rehab program. Flowsheet Row PULMONARY REHAB CHRONIC OBSTRUCTIVE PULMONARY DISEASE from 09/13/2022 in [facility] for Heart, Vascular, & Lung Health  Date 09/06/22  [Stress and Energy]  Educator EP  Instruction Review Code 1- Verbalizes Understanding        Knowledge Questionnaire Score:  Knowledge Questionnaire Score - 08/31/22 1349       Knowledge Questionnaire Score   Pre Score 18/18             Core Components/Risk Factors/Patient Goals at Admission:  Personal Goals and Risk Factors at Admission - 08/31/22 1342       Core Components/Risk Factors/Patient Goals on Admission    Weight Management Weight Maintenance    Improve shortness of breath with ADL's Yes    Intervention Provide education, individualized exercise plan and daily activity instruction to help decrease symptoms of SOB with activities of daily living.    Expected Outcomes Short Term: Improve cardiorespiratory fitness to achieve a reduction of symptoms when performing ADLs;Long Term: Be able  to perform more ADLs without symptoms or delay the onset of symptoms             Core Components/Risk Factors/Patient Goals Review:   Goals and Risk Factor Review     Row Name 09/12/22 1122             Core Components/Risk Factors/Patient Goals Review  Personal Goals Review Weight Management/Obesity;Improve shortness of breath with ADL's;Develop more efficient breathing techniques such as purse lipped breathing and diaphragmatic breathing and practicing self-pacing with activity.       Review Addilyne is just getting started in the program. She is exercising on the nustep and walking the track. Her goal is to be able to walk on the treadmill. She is very deconditioned but, she is very motivated to regain what she has lost since her hospitalization and illness.She reports moderate SOB with exercising and her oxygen saturations have been 90-96% exercising.       Expected Outcomes See admission goals.                Core Components/Risk Factors/Patient Goals at Discharge (Final Review):   Goals and Risk Factor Review - 09/12/22 1122       Core Components/Risk Factors/Patient Goals Review   Personal Goals Review Weight Management/Obesity;Improve shortness of breath with ADL's;Develop more efficient breathing techniques such as purse lipped breathing and diaphragmatic breathing and practicing self-pacing with activity.    Review Oluwadarasimi is just getting started in the program. She is exercising on the nustep and walking the track. Her goal is to be able to walk on the treadmill. She is very deconditioned but, she is very motivated to regain what she has lost since her hospitalization and illness.She reports moderate SOB with exercising and her oxygen saturations have been 90-96% exercising.    Expected Outcomes See admission goals.             ITP Comments:ITP REVIEW Pt is making expected progress toward pulmonary rehab goals after completing 4 sessions. Recommend continued  exercise, life style modification, education, and utilization of breathing techniques to increase stamina and strength and decrease shortness of breath with exertion.    Comments: Dr. Rodman Pickle is Medical Director for Pulmonary Rehab at [facility].

## 2022-09-20 ENCOUNTER — Encounter (HOSPITAL_COMMUNITY): Payer: Medicare Other

## 2022-09-20 ENCOUNTER — Telehealth: Payer: Self-pay | Admitting: Internal Medicine

## 2022-09-20 MED ORDER — AZITHROMYCIN 250 MG PO TABS: ORAL_TABLET | ORAL | 0 refills | Status: DC

## 2022-09-20 MED ORDER — PREDNISONE 20 MG PO TABS: 40.0000 mg | ORAL_TABLET | Freq: Every day | ORAL | 0 refills | Status: DC

## 2022-09-20 NOTE — Telephone Encounter (Signed)
Called and spoke with pt letting her know recs per [facility] and she verbalized understanding. Prednisone and zpak have been sent to preferred pharmacy. Nothing further needed.

## 2022-09-20 NOTE — Telephone Encounter (Signed)
Called and spoke with pt who states she received both the flu shot and RSV vaccine 12/12. Then on 12/14, she started not feeling well having complaints of cough with white phlegm.  Pt is now also wheezing and also has increased SOB.  Pt denies any complaints of fever.  Due to pt's history of lung cancer and also COPD, pt is wanting to have meds prescribed to try to get this under control now before her symptoms get worse to the point that she ends up in the hospital.  Pt is requesting an abx to be prescribed. No available appts today.   With Dr. Melvyn Novas being out of the office and since pt has seen Katie in the past, routing this to her for review.  Katie, please advise.

## 2022-09-20 NOTE — Telephone Encounter (Signed)
PT states since last week sick w/cough, white phlegm , SOB, wheezing but no fever. History of lung cancer & COPD Pls call @ [phone]

## 2022-09-20 NOTE — Telephone Encounter (Signed)
Please send prednisone 40 mg daily x 5 days. Take in AM with food. Azithromycin 250 mg tabs - 2 tabs on day 1 then 1 tab daily for four additional days. Take with food. Use guaifenesin [phone] mg Twice daily for chest congestion/cough. Continue maintenance inhalers. Monitor O2 for goal >88-90%. Call if no improvement or go to ED if symptoms worsen. Thanks.

## 2022-09-25 ENCOUNTER — Telehealth (HOSPITAL_COMMUNITY): Payer: Self-pay | Admitting: *Deleted

## 2022-09-25 NOTE — Telephone Encounter (Signed)
Called [name] to check on her since she has been out of the PR classes ill. I was not able to reach her but, I was able to leave a message and ask her to call us back for an update on her condition.

## 2022-09-27 ENCOUNTER — Encounter (HOSPITAL_COMMUNITY)
Admission: RE | Admit: 2022-09-27 | Discharge: 2022-09-27 | Disposition: A | Discharge status: Home / Self Care | Payer: Medicare Other | Source: Ambulatory Visit | Attending: Internal Medicine | Admitting: Internal Medicine

## 2022-09-27 DIAGNOSIS — J449 Chronic obstructive pulmonary disease, unspecified: Secondary | ICD-10-CM | POA: Diagnosis not present

## 2022-09-27 NOTE — Progress Notes (Signed)
Daily Session Note  Patient Details  Name: [name] MRN: [id number] Date of Birth: 24-Jan-1956 Referring Provider:   April Archer Pulmonary Rehab Walk Test from 08/31/2022 in [facility] for Heart, Vascular, & [place]  Referring Provider Wert       Encounter Date: 09/27/2022  Check In:  Session Check In - 09/27/22 1635       Check-In   Supervising physician immediately available to respond to emergencies [facility] - Physician supervision    Physician(s) Dr. Erskine Emery    Location [facility]    Staff Present Rodney Langton, RN;Carlette Wilber Oliphant, RN, BSN;Jetta Walker BS, ACSM-CEP, Exercise Physiologist;Randi [facility], ACSM-CEP, Exercise Physiologist    Virtual Visit No    Medication changes reported     No    Fall or balance concerns reported    No    Tobacco Cessation No Change    Warm-up and Cool-down Performed as group-led instruction    Resistance Training Performed Yes    VAD Patient? No    PAD/SET Patient? No      Pain Assessment   Currently in Pain? No/denies    Multiple Pain Sites No             Capillary Blood Glucose: No results found for this or any previous visit (from the past 24 hour(s)).    Social History   Tobacco Use  Smoking Status Former   Packs/day: 1.00   Years: 40.00   Total pack years: 40.00   Types: Cigarettes   Quit date: 12/14/2017   Years since quitting: 4.7   Passive exposure: Never  Smokeless Tobacco Never    Goals Met:  Proper associated with RPD/PD & O2 Sat No report of concerns or symptoms today Strength training completed today  Goals Unmet:  Not Applicable  Comments: Service time is from 1329 to 1515 Pt was very fatigued and dyspneic on the treadmill today. She had to cut her time short on the treadmill and rest. She improved with rest. I explained to her that she needs to walk the track to build herself back up since she has been out ill. She voiced understanding.    Dr.  Rodman Archer is Medical Director for Pulmonary Rehab at [facility].

## 2022-10-02 ENCOUNTER — Encounter (HOSPITAL_COMMUNITY): Payer: Medicare HMO

## 2022-10-03 ENCOUNTER — Telehealth (HOSPITAL_COMMUNITY): Payer: Self-pay | Admitting: *Deleted

## 2022-10-03 NOTE — Telephone Encounter (Addendum)
Returned call from [place]. She states that she knew that she had a hiatal hernia. This past Friday night into Saturday she awoke with severe indigestion. She states that she has had it before and knew what it was. Now she is not able to eat due to a feeling of chocking and burning in her throat.She states that she has only been able to eat graham crackers. I did explain to her how important that it is for her to maintain proper nutrition. She voices understanding. [name] has an appointment with her PCP tomorrow and is hoping for a referral to a gastrologist. She feels that she needs to drop from the PR program for now to get this issue resolved. I did encourage her to ask her pulmonologist for a new referral to PR when she has this resolved.

## 2022-10-03 NOTE — Progress Notes (Signed)
Discharge Progress Report  Patient Details  Name: [name] MRN: [id number] Date of Birth: 30-Dec-1955 Referring Provider:   [name] [facility] Re[facility] Walk Test from 08/31/2022 in [facility] [place]  Referring Provider Wert        Number of Visits: 5  Reason for Discharge:  Early Exit:  [name] called and states that she is having issues with her hiatal hernia. She feels that she needs to drop form the program and get this taken care of. I did encourage her to ask her pulmonologist for a new referral when she has this resolved.    Smoking History:  Social History   Tobacco Use  Smoking Status Former   Packs/day: 1.00   Years: 40.00   Total pack years: 40.00   Types: Cigarettes   Quit date: 12/14/2017   Years since quitting: 4.8   Passive exposure: Never  Smokeless Tobacco Never    Diagnosis:  Stage 3 severe COPD by GOLD classification ([place])  ADL [facility]:  Pulmonary Assessment Scores     Row Name 08/31/22 1348         ADL [facility]   SOB Score total 36       CAT Score   CAT Score 16       mMRC Score   mMRC Score 1              Initial Exercise Prescription:  Initial Exercise Prescription - 08/31/22 1500       Date of Initial Exercise RX and Referring Provider   Date 08/31/22    Referring Provider Wert    Expected Discharge Date 11/08/22      NuStep   Level 1    SPM 80    Minutes 15    METs 2      Track   Minutes 15    METs 1.93      Prescription Details   Frequency (times per week) 2    Duration Progress to 30 minutes of continuous aerobic without signs/symptoms of physical distress      Intensity   THRR 40-80% of Max Heartrate 61-122    Ratings of Perceived Exertion 11-13    Perceived Dyspnea 0-4      Progression   Progression Continue to progress workloads to maintain intensity without signs/symptoms of physical distress.      Resistance Training   Training Prescription Yes     Weight blue bands    Reps 10-15             Discharge Exercise Prescription (Final Exercise Prescription Changes):  Exercise Prescription Changes - 09/18/22 1500       Response to Exercise   Blood Pressure (Admit) 110/76    Blood Pressure (Exercise) 146/70    Blood Pressure (Exit) 130/70    Heart Rate (Admit) 62 bpm    Heart Rate (Exercise) 80 bpm    Heart Rate (Exit) 66 bpm    Oxygen Saturation (Admit) 96 %    Oxygen Saturation (Exercise) 92 %    Oxygen Saturation (Exit) 97 %    Rating of Perceived Exertion (Exercise) 9    Perceived Dyspnea (Exercise) 1    Duration Continue with 30 min of aerobic exercise without signs/symptoms of physical distress.    Intensity THRR unchanged      Progression   Progression Continue to progress workloads to maintain intensity without signs/symptoms of physical distress.      Resistance Training  Training Prescription Yes    Weight blue bands    Reps 10-15    Time 10 Minutes      Treadmill   MPH 1    Grade 0    Minutes 15    METs 1.8      NuStep   Level 2    SPM 80    Minutes 15    METs 1.8             Functional Capacity:  6 Minute Walk     Row Name 08/31/22 1504         6 Minute Walk   Phase Initial     Distance 1150 feet     Walk Time 6 minutes     # of Rest Breaks 2  2:20-2:26, 2:55-3:22     MPH 2.18     METS 2.81     RPE 13     Perceived Dyspnea  2.5     VO2 Peak 9.83     Symptoms No     Resting HR 67 bpm     Resting BP 138/80     Resting Oxygen Saturation  96 %     Exercise Oxygen Saturation  during 6 min walk 89 %     Max Ex. HR 89 bpm     Max Ex. BP 148/80     2 Minute Post BP 128/80       Interval HR   1 Minute HR 79     2 Minute HR 82     3 Minute HR 82     4 Minute HR 85     5 Minute HR 88     6 Minute HR 89     2 Minute Post HR 68     Interval Heart Rate? Yes       Interval Oxygen   Interval Oxygen? Yes     Baseline Oxygen Saturation % 96 %     1 Minute Oxygen Saturation % 92 %      1 Minute Liters of Oxygen 0 L     2 Minute Oxygen Saturation % 90 %     2 Minute Liters of Oxygen 0 L     3 Minute Oxygen Saturation % 90 %     3 Minute Liters of Oxygen 0 L     4 Minute Oxygen Saturation % 90 %     4 Minute Liters of Oxygen 0 L     5 Minute Oxygen Saturation % 89 %     5 Minute Liters of Oxygen 0 L     6 Minute Oxygen Saturation % 89 %     6 Minute Liters of Oxygen 0 L     2 Minute Post Oxygen Saturation % 96 %     2 Minute Post Liters of Oxygen 0 L              Psychological, QOL, Others - Outcomes: PHQ 2/9:    08/31/2022    1:37 PM 05/23/2022   11:46 AM 02/21/2022    3:36 PM 01/03/2022    9:26 AM 09/27/2021    1:47 PM  Depression screen PHQ 2/9  Decreased Interest 0 3 0 0 3  Down, Depressed, Hopeless 0 3 0 0 0  PHQ - 2 Score 0 6 0 0 3  Altered sleeping 2 0 1 0 2  Tired, decreased energy 1 1 3  0 3  Change in appetite 0 0  0 0 0  Feeling bad or failure about yourself  0 1 0 0 0  Trouble concentrating 0 0 0 0 0  Moving slowly or fidgety/restless 0 0 0 0 0  Suicidal thoughts 0 0 0 0 0  PHQ-9 Score 3 8 4  0 8  Difficult doing work/chores Not difficult at all Somewhat difficult Not difficult at all Not difficult at all     Quality of Life:   Personal Goals: Goals established at orientation with interventions provided to work toward goal.  Personal Goals and Risk Factors at Admission - 08/31/22 1342       Core Components/Risk Factors/Patient Goals on Admission    Weight Management Weight Maintenance    Improve shortness of breath with ADL's Yes    Intervention Provide education, individualized exercise plan and daily activity instruction to help decrease symptoms of SOB with activities of daily living.    Expected Outcomes Short Term: Improve cardiorespiratory fitness to achieve a reduction of symptoms when performing ADLs;Long Term: Be able to perform more ADLs without symptoms or delay the onset of symptoms              Personal Goals  Discharge:  Goals and Risk Factor Review     Row Name 09/12/22 1122             Core Components/Risk Factors/Patient Goals Review   Personal Goals Review Weight Management/Obesity;Improve shortness of breath with ADL's;Develop more efficient breathing techniques such as purse lipped breathing and diaphragmatic breathing and practicing self-pacing with activity.       Review [name] is just getting started in the program. She is exercising on the nustep and walking the track. Her goal is to be able to walk on the treadmill. She is very deconditioned but, she is very motivated to regain what she has lost since her hospitalization and illness.She reports moderate SOB with exercising and her oxygen saturations have been 90-96% exercising.       Expected Outcomes See admission goals.                Exercise Goals and Review:  Exercise Goals     Row Name 08/31/22 1506 09/17/22 1522           Exercise Goals   Increase Physical Activity Yes Yes      Intervention Provide advice, education, support and counseling about physical activity/exercise needs.;Develop an individualized exercise prescription for aerobic and resistive training based on initial evaluation findings, risk stratification, comorbidities and participant's personal goals. Provide advice, education, support and counseling about physical activity/exercise needs.;Develop an individualized exercise prescription for aerobic and resistive training based on initial evaluation findings, risk stratification, comorbidities and participant's personal goals.      Expected Outcomes Short Term: Attend rehab on a regular basis to increase amount of physical activity.;Long Term: Exercising regularly at least 3-5 days a week.;Long Term: Add in home exercise to make exercise part of routine and to increase amount of physical activity. Short Term: Attend rehab on a regular basis to increase amount of physical activity.;Long Term: Exercising  regularly at least 3-5 days a week.;Long Term: Add in home exercise to make exercise part of routine and to increase amount of physical activity.      Increase Strength and Stamina Yes Yes      Intervention Provide advice, education, support and counseling about physical activity/exercise needs.;Develop an individualized exercise prescription for aerobic and resistive training based on initial evaluation findings, risk stratification, comorbidities and participant's  personal goals. Provide advice, education, support and counseling about physical activity/exercise needs.;Develop an individualized exercise prescription for aerobic and resistive training based on initial evaluation findings, risk stratification, comorbidities and participant's personal goals.      Expected Outcomes Short Term: Increase workloads from initial exercise prescription for resistance, speed, and METs.;Short Term: Perform resistance training exercises routinely during rehab and add in resistance training at home;Long Term: Improve cardiorespiratory fitness, muscular endurance and strength as measured by increased METs and functional capacity (6MWT) Short Term: Increase workloads from initial exercise prescription for resistance, speed, and METs.;Short Term: Perform resistance training exercises routinely during rehab and add in resistance training at home;Long Term: Improve cardiorespiratory fitness, muscular endurance and strength as measured by increased METs and functional capacity (6MWT)      Able to understand and use rate of perceived exertion (RPE) scale Yes Yes      Intervention Provide education and explanation on how to use RPE scale Provide education and explanation on how to use RPE scale      Expected Outcomes Short Term: Able to use RPE daily in rehab to express subjective intensity level;Long Term:  Able to use RPE to guide intensity level when exercising independently Short Term: Able to use RPE daily in rehab to express  subjective intensity level;Long Term:  Able to use RPE to guide intensity level when exercising independently      Able to understand and use Dyspnea scale Yes Yes      Intervention Provide education and explanation on how to use Dyspnea scale Provide education and explanation on how to use Dyspnea scale      Expected Outcomes Short Term: Able to use Dyspnea scale daily in rehab to express subjective sense of shortness of breath during exertion;Long Term: Able to use Dyspnea scale to guide intensity level when exercising independently Short Term: Able to use Dyspnea scale daily in rehab to express subjective sense of shortness of breath during exertion;Long Term: Able to use Dyspnea scale to guide intensity level when exercising independently      Knowledge and understanding of Target Heart Rate Range (THRR) Yes Yes      Intervention Provide education and explanation of THRR including how the numbers were predicted and where they are located for reference Provide education and explanation of THRR including how the numbers were predicted and where they are located for reference      Expected Outcomes Short Term: Able to state/look up THRR;Long Term: Able to use THRR to govern intensity when exercising independently;Short Term: Able to use daily as guideline for intensity in rehab Short Term: Able to state/look up THRR;Long Term: Able to use THRR to govern intensity when exercising independently;Short Term: Able to use daily as guideline for intensity in rehab      Understanding of Exercise Prescription Yes Yes      Intervention Provide education, explanation, and written materials on patient's individual exercise prescription Provide education, explanation, and written materials on patient's individual exercise prescription      Expected Outcomes Short Term: Able to explain program exercise prescription;Long Term: Able to explain home exercise prescription to exercise independently Short Term: Able to  explain program exercise prescription;Long Term: Able to explain home exercise prescription to exercise independently               Exercise Goals Re-Evaluation:  Exercise Goals Re-Evaluation     [place] Name 09/17/22 1522             Exercise Goal  Re-Evaluation   Exercise Goals Review Increase Physical Activity;Able to understand and use Dyspnea scale;Understanding of Exercise Prescription;Increase Strength and Stamina;Knowledge and understanding of Target Heart Rate Range (THRR);Able to understand and use rate of perceived exertion (RPE) scale       Comments [name] has completed 3 exercise sessions. She is motivated and tolerating well. She exercises for 15 min on the Nustep and treadmill . She averages 1.9 METs at level 3 on the Nustep and 1.8 METs on the treadmill at 1.0 mph. She had a bad breathing day last week therefore could not progress METs. She performs the warmup and cooldown standing without limitations. Will continue to monitor and progress as able.       Expected Outcomes Through exercise at rehab and home, the patient will decrease shortness of breath with daily activities and feel confident in carrying out an exercise regimen at home.                Nutrition & Weight - Outcomes:   Post Biometrics - 08/31/22 1328        Post  Biometrics   Grip Strength 22 kg             Nutrition:  Nutrition Therapy & Goals - 09/06/22 1508       Nutrition Therapy   Diet General Healthy Diet      Personal Nutrition Goals   Nutrition Goal Patient to improve diet quality by the using the plate method as a daily guide for meal planning to include lean protein/plant protein, fruits, vegetables, whole grains, and nonfat/low fat dairy as part of balanced diet    Comments [name] has gained about 27# over the last year (125# on 09/07/2021); she had lost significant weight due to illness and the death of her husband. She would like to maintain her weight at this point and  improve diet quality. She reports take-out/fast food regularly.      Intervention Plan   Intervention Prescribe, educate and counsel regarding individualized specific dietary modifications aiming towards targeted core components such as weight, hypertension, lipid management, diabetes, heart failure and other comorbidities.;Nutrition handout(s) given to patient.    Expected Outcomes Short Term Goal: Understand basic principles of dietary content, such as calories, fat, sodium, cholesterol and nutrients.;Long Term Goal: Adherence to prescribed nutrition plan.             Nutrition Discharge:  Nutrition Assessments - 09/06/22 1513       Rate Your Plate Scores   Pre Score 51             Education Questionnaire Score:  Knowledge Questionnaire Score - 08/31/22 1349       Knowledge Questionnaire Score   Pre Score 18/18             Goals reviewed with patient; copy given to patient.

## 2022-10-04 ENCOUNTER — Ambulatory Visit: Payer: Medicare Other | Admitting: Adult Health

## 2022-10-04 ENCOUNTER — Encounter (HOSPITAL_COMMUNITY): Payer: Medicare HMO

## 2022-10-05 ENCOUNTER — Other Ambulatory Visit (INDEPENDENT_AMBULATORY_CARE_PROVIDER_SITE_OTHER): Payer: Medicare HMO

## 2022-10-05 ENCOUNTER — Encounter: Payer: Self-pay | Admitting: Gastroenterology

## 2022-10-05 ENCOUNTER — Ambulatory Visit (INDEPENDENT_AMBULATORY_CARE_PROVIDER_SITE_OTHER)
Admission: RE | Admit: 2022-10-05 | Discharge: 2022-10-05 | Disposition: A | Discharge status: Home / Self Care | Payer: Medicare HMO | Source: Ambulatory Visit | Attending: Gastroenterology | Admitting: Gastroenterology

## 2022-10-05 ENCOUNTER — Ambulatory Visit: Payer: Medicare HMO | Admitting: Gastroenterology

## 2022-10-05 VITALS — BP 116/74 | HR 65 | Wt 154.0 lb

## 2022-10-05 DIAGNOSIS — R1013 Epigastric pain: Secondary | ICD-10-CM | POA: Diagnosis not present

## 2022-10-05 DIAGNOSIS — R1310 Dysphagia, unspecified: Secondary | ICD-10-CM | POA: Diagnosis not present

## 2022-10-05 DIAGNOSIS — Z1211 Encounter for screening for malignant neoplasm of colon: Secondary | ICD-10-CM

## 2022-10-05 DIAGNOSIS — R079 Chest pain, unspecified: Secondary | ICD-10-CM

## 2022-10-05 DIAGNOSIS — Z8619 Personal history of other infectious and parasitic diseases: Secondary | ICD-10-CM

## 2022-10-05 DIAGNOSIS — R109 Unspecified abdominal pain: Secondary | ICD-10-CM

## 2022-10-05 DIAGNOSIS — R932 Abnormal findings on diagnostic imaging of liver and biliary tract: Secondary | ICD-10-CM

## 2022-10-05 DIAGNOSIS — K449 Diaphragmatic hernia without obstruction or gangrene: Secondary | ICD-10-CM

## 2022-10-05 DIAGNOSIS — K219 Gastro-esophageal reflux disease without esophagitis: Secondary | ICD-10-CM

## 2022-10-05 LAB — CBC
HCT: 43.2 % (ref 36.0–46.0)
Hemoglobin: 14.4 g/dL (ref 12.0–15.0)
MCHC: 33.4 g/dL (ref 30.0–36.0)
MCV: 81.9 fl (ref 78.0–100.0)
Platelets: 333 10*3/uL (ref 150.0–400.0)
RBC: 5.27 Mil/uL — ABNORMAL HIGH (ref 3.87–5.11)
RDW: 15.3 % (ref 11.5–15.5)
WBC: 10.1 10*3/uL (ref 4.0–10.5)

## 2022-10-05 LAB — COMPREHENSIVE METABOLIC PANEL
ALT: 20 U/L (ref 0–35)
AST: 18 U/L (ref 0–37)
Albumin: 4.4 g/dL (ref 3.5–5.2)
Alkaline Phosphatase: 124 U/L — ABNORMAL HIGH (ref 39–117)
BUN: 11 mg/dL (ref 6–23)
CO2: 26 mEq/L (ref 19–32)
Calcium: 9.6 mg/dL (ref 8.4–10.5)
Chloride: 100 mEq/L (ref 96–112)
Creatinine, Ser: 0.98 mg/dL (ref 0.40–1.20)
GFR: 59.9 mL/min — ABNORMAL LOW (ref >60.00)
Glucose, Bld: 113 mg/dL — ABNORMAL HIGH (ref 70–99)
Potassium: 3.7 mEq/L (ref 3.5–5.1)
Sodium: 137 mEq/L (ref 135–145)
Total Bilirubin: 0.9 mg/dL (ref 0.2–1.2)
Total Protein: 8 g/dL (ref 6.0–8.3)

## 2022-10-05 LAB — LIPASE: Lipase: 12 U/L (ref 11.0–59.0)

## 2022-10-05 MED ORDER — ESOMEPRAZOLE MAGNESIUM 40 MG PO CPDR: 40.0000 mg | DELAYED_RELEASE_CAPSULE | Freq: Two times a day (BID) | ORAL | 2 refills | Status: DC

## 2022-10-05 MED ORDER — SUCRALFATE 1 G PO TABS: 1.0000 g | ORAL_TABLET | Freq: Four times a day (QID) | ORAL | 2 refills | Status: DC

## 2022-10-05 NOTE — H&P (View-Only) (Signed)
GASTROENTEROLOGY OUTPATIENT CLINIC VISIT   Primary Care Provider Shirline Frees, NP [address] [place] [address] [phone]  Referring Provider Shirline Frees, NP [address] [place],  [place] [address] [phone]  Patient Profile: [name] is a 67 y.o. female with a pmh significant for COPD, lung cancer (status post resection), MDD, anxiety, status post hysterectomy, status post appendectomy, previously treated HCV (previous 2020 ultrasound imaging suggested possible cirrhosis), hiatal hernia.  The patient presents to the [facility] for an evaluation and management of problem(s) noted below:  Problem List 1. Gastroesophageal reflux disease, unspecified whether esophagitis present   2. Epigastric pain   3. Pill dysphagia   4. Hiatal hernia   5. Chest pain, unspecified type   6. History of hepatitis C   7. Abnormal ultrasound of liver   8. Colon cancer screening     History of Present Illness This is the patient's first visit to the outpatient [facility] in years.  She was previously seen by Dr. Christella Hartigan.  The patient states that she has had abdominal discomfort for quite a while.  However, for the last 3 to 4 months she has been experiencing and midepigastric abdominal discomfort that has been much worse.  She has had associated issues of heartburn for which she takes PPI.  Within the last few weeks she has had chest pain and pressure.  She has had pill dysphagia symptoms.  She at times will have bloating.  She has nausea at times.  She has had 3 episodes of regurgitation of liquids but not occurring around the time of her eating but a few hours later.  Solid foods have not caused her significant issues.  She has had colon cancer screening with Cologuard testing her last Cologuard being done more than 3 years ago.  She would like to continue colon cancer screening with Cologuard as she has been negative before.  She has had  hepatitis C in the past and was treated by infectious disease years ago with negative viral load but it has not been checked in years.  GI Review of Systems Positive as above Negative for alteration of bowel habits, melena, hematochezia  Review of Systems General: Denies fevers/chills/weight loss unintentionally Cardiovascular: Denies current chest pain/palpitations today in clinic Pulmonary: Shortness of breath at baseline Gastroenterological: See HPI Genitourinary: Denies darkened urine Hematological: Denies easy bruising/bleeding Dermatological: Denies jaundice Psychological: Mood is stable   Medications Current Outpatient Medications  Medication Sig Dispense Refill   albuterol (PROVENTIL) (2.5 MG/3ML) 0.083% nebulizer solution Take 3 mLs (2.5 mg total) by nebulization every 4 (four) hours as needed for wheezing or shortness of breath. 75 mL 12   albuterol (VENTOLIN HFA) 108 (90 Base) MCG/ACT inhaler Inhale 2 puffs into the lungs every 6 (six) hours as needed for wheezing or shortness of breath. 8 g 0   Budeson-Glycopyrrol-Formoterol (BREZTRI AEROSPHERE) 160-9-4.8 MCG/ACT AERO Inhale 2 puffs into the lungs 2 (two) times daily. 10.7 g 11   clonazePAM (KLONOPIN) 0.5 MG tablet Take 1 tablet (0.5 mg total) by mouth daily. for anxiety 90 tablet 2   esomeprazole (NEXIUM) 40 MG capsule Take 1 capsule (40 mg total) by mouth 2 (two) times daily before a meal. 60 capsule 2   fluticasone (FLONASE) 50 MCG/ACT nasal spray Place 1 spray into both nostrils daily. 18.2 mL 2   guaiFENesin (MUCINEX) 600 MG 12 hr tablet Take 600 mg by mouth 2 (two) times daily as needed for cough or to  loosen phlegm.     PARoxetine (PAXIL) 40 MG tablet Take 1 tablet (40 mg total) by mouth daily. 90 tablet 1   propranolol ER (INDERAL LA) 60 MG 24 hr capsule Take 1 capsule (60 mg total) by mouth daily. 90 capsule 3   sucralfate (CARAFATE) 1 g tablet Take 1 tablet (1 g total) by mouth 4 (four) times daily. Please give  patient instructions for slurry. 60 tablet 2   traZODone (DESYREL) 100 MG tablet TAKE 1 TABLET BY MOUTH AT BEDTIME AS NEEDED FOR SLEEP 90 tablet 1   No current facility-administered medications for this visit.    Allergies Allergies  Allergen Reactions   Aspirin Shortness Of Breath   Trelegy Ellipta [Fluticasone-Umeclidin-Vilant] Shortness Of Breath    She reports that is made her cough worse.    Morphine Sulfate Other (See Comments)    RED STREAKS ON ARMS.   Oxycodone Itching and Rash   Glycopyrrolate Other (See Comments)    UNSPECIFIED REACTION  pt unsure if she is allergic.   Lactose Intolerance (Gi) Nausea And Vomiting    Milk only.Patient states that she can eat cheese.   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    Hepatitis C    lung ca dx'd 03/2018   Tremor, unspecified    non-specific; takes inderal.    Past Surgical History:  Procedure Laterality Date   ABDOMINAL HYSTERECTOMY  1986   menorrhagia; hx of cryo for abnormal paps   APPENDECTOMY     removed in 1986 per pt   CATARACT EXTRACTION Bilateral 07/2022   INTERCOSTAL NERVE BLOCK Right 12/18/2019   Procedure: Intercostal Nerve Block;   Surgeon: Loreli Slot, MD;  Location: [facility] OR;  Service: Thoracic;  Laterality: Right;   LOBECTOMY Left 03/13/2018   Procedure: Lingula section of left upper lobe lobectomy;  Surgeon: Loreli Slot, MD;  Location: [facility] OR;  Service: Thoracic;  Laterality: Left;   NODE DISSECTION  12/18/2019   Procedure: Node Dissection;  Surgeon: Loreli Slot, MD;  Location: [facility] OR;  Service: Thoracic;;   VIDEO ASSISTED THORACOSCOPY (VATS)/WEDGE RESECTION Left 03/13/2018   Procedure: VIDEO ASSISTED THORACOSCOPY (VATS)/WEDGE RESECTION;  Surgeon: Loreli Slot, MD;  Location: [facility] OR;  Service: Thoracic;  Laterality: Left;   Social History   Socioeconomic History   Marital status: Widowed    Spouse name: Not on file   Number of children: Not on file   Years of education: Not on file   Highest education level: 12th grade  Occupational History   Not on file  Tobacco Use   Smoking status: Former  Packs/day: 1.00    Years: 40.00    Total pack years: 40.00    Types: Cigarettes    Quit date: 12/14/2017    Years since quitting: 4.8    Passive exposure: Never   Smokeless tobacco: Never  Vaping Use   Vaping Use: Never used  Substance and Sexual Activity   Alcohol use: Yes    Alcohol/week: 7.0 standard drinks of alcohol    Types: 7 Shots of liquor per week    Comment: occasional bourbon   Drug use: Not Currently   Sexual activity: Never  Other Topics Concern   Not on file  Social History Narrative   Not on file   Social Determinants of Health   Financial Resource Strain: Low Risk  (08/15/2022)   Overall Financial Resource Strain (CARDIA)    Difficulty of Paying Living Expenses: Not hard at all  Food Insecurity: No Food Insecurity (08/15/2022)   Hunger Vital Sign    Worried About Running Out of Food in the Last Year: Never true    Ran Out of Food in the Last Year: Never true  Transportation Needs: No Transportation Needs (08/15/2022)   PRAPARE - Therapist, art (Medical): No    Lack of Transportation (Non-Medical): No  Physical Activity: Insufficiently Active (08/15/2022)   Exercise Vital Sign    Days of Exercise per Week: 1 day    Minutes of Exercise per Session: 10 min  Stress: No Stress Concern Present (08/15/2022)   [employer] of Occupational Health - Occupational Stress Questionnaire    Feeling of Stress : Not at all  Social Connections: Moderately Isolated (08/15/2022)   Social Connection and Isolation Panel [NHANES]    Frequency of Communication with Friends and Family: Twice a week    Frequency of Social Gatherings with Friends and Family: Once a week    Attends Religious Services: 1 to 4 times per year    Active Member of [employer] or Organizations: No    Attends Banker Meetings: Not on file    Marital Status: Widowed  Intimate Partner Violence: Not At Risk (09/22/2021)   Humiliation, Afraid, Rape, and Kick questionnaire    Fear of Current or Ex-Partner: No    Emotionally Abused: No    Physically Abused: No    Sexually Abused: No   Family History  Problem Relation Age of Onset   Asthma Mother    Alzheimer's disease Mother 37   Hypertension Mother    Other Father        shot    Neuropathy Sister        face   Cancer Maternal Aunt        breast   Cancer Paternal Aunt    Lung cancer Paternal Uncle    Cancer Paternal Grandmother        abdominal   Colon cancer Neg Hx    Esophageal cancer Neg Hx    Inflammatory bowel disease Neg Hx    Liver disease Neg Hx    Pancreatic cancer Neg Hx    Rectal cancer Neg Hx    Stomach cancer Neg Hx    I have reviewed her medical, social, and family history in detail and updated the electronic medical record as necessary.    PHYSICAL EXAMINATION  BP 116/74   Pulse 65   Wt 154 lb (69.9 kg)   BMI 26.43 kg/m  Wt Readings from Last 3 Encounters:  10/05/22 154 lb (69.9 kg)  09/18/22 154 lb 15.7 oz (70.3 kg)  08/31/22 153 lb 14.1 oz (69.8 kg)   GEN: NAD, appears older than stated age, a nontoxic PSYCH: Cooperative, without pressured speech EYE: Conjunctivae pink, sclerae anicteric ENT: MMM CV: Nontachycardic RESP: Mild audible wheezing present GI: NABS, soft, mild tenderness to palpation in MEG, no rebound or guarding  MSK/EXT: No significant lower extremity edema SKIN: No jaundice NEURO:  Alert & Oriented x 3, no focal deficits   REVIEW OF DATA  I reviewed the following data at the time of this encounter:  GI Procedures and Studies  2009 colonoscopy with 2 sessile polyps that returned as hyperplastic so remained average risk  Patient reports Cologuard testing within the last 5 years negative but we do not have access to that  Laboratory Studies  Reviewed those in epic  Imaging Studies  2020 abdominal ultrasound IMPRESSION: Mild changes of cirrhosis stable from the previous ultrasound. No mass lesion is noted.   ASSESSMENT  Ms. Barna is a 67 y.o. female with a pmh significant for COPD, lung cancer (status post resection), MDD, anxiety, status post hysterectomy, status post appendectomy, previously treated HCV, hiatal hernia.  The patient is seen today for evaluation and management of:  1. Gastroesophageal reflux disease, unspecified whether esophagitis present   2. Epigastric pain   3. Pill dysphagia   4. Hiatal hernia   5. Chest pain, unspecified type   6. History of hepatitis C   7. Abnormal ultrasound of liver   8. Colon cancer screening    Patient is hemodynamically stable.  Clinically she is having progressive midepigastric discomfort as well as dysphagia symptoms.  Further evaluation is required at this time.  We will increase her PPI to twice daily dosing.  She will initiate Carafate slurry dosing at least twice daily.  She will be set up for an upper endoscopy to evaluate the symptoms and ensure we are not missing any other signs of a dysplastic area and evaluate also for the possibility of any  underlying cirrhosis/portal hypertension as result of her previous treated hepatitis C.  Last imaging in 2020 had suggested the possibility of early cirrhosis but her platelet count is normal which makes this seem less likely though not impossible.  Will plan to get updated abdominal imaging to evaluate the spleen size as well.  Will get updated laboratories as well to see and ensure that she still has an SVR.  Will make sure she also has vaccine status to hepatitis A and hepatitis B.  For her abdominal discomfort and pain will further evaluate this with laboratories to ensure no evidence of pancreatitis.  We are going to get an x-ray of her chest as well as her abdomen to ensure no significant abnormalities.  Pending the timing of her endoscopic evaluation which will need to be done in the hospital-based setting due to her severe COPD, she will perform an H. pylori stool antigen.  The risks and benefits of endoscopic evaluation were discussed with the patient; these include but are not limited to the risk of perforation, infection, bleeding, missed lesions, lack of diagnosis, severe illness requiring hospitalization, as well as anesthesia and sedation related illnesses.  The patient and/or family is agreeable to proceed.  All patient questions were answered to the best of my ability, and the patient agrees to the aforementioned plan of action with follow-up as indicated.   PLAN  Laboratories as outlined below KUB to be performed Chest x-ray to be performed H. pylori stool  antigen to be obtained Proceed with diagnostic endoscopy Abdominal ultrasound with elastography to be performed Ensure HAV/HBV immunization status Ensure HCV SVR after prior treatment Increase Nexium to 40 mg twice daily Carafate twice daily slurry   Orders Placed This Encounter  Procedures   Procedural/ Surgical Case Request: ESOPHAGOGASTRODUODENOSCOPY (EGD) WITH PROPOFOL   DG Chest 2 View   DG Abd 2 Views   CBC   Comp Met  (CMET)   Lipase   Hepatitis A Antibody, Total   Cologuard   Hepatitis B Surface AntiBODY   Hepatitis B Surface AntiGEN   Hepatitis B core antibody, total   HCV RNA BY PCR, QN RFX GENO   Helicobacter pylori special antigen   Ambulatory referral to Gastroenterology    New Prescriptions   ESOMEPRAZOLE (NEXIUM) 40 MG CAPSULE    Take 1 capsule (40 mg total) by mouth 2 (two) times daily before a meal.   SUCRALFATE (CARAFATE) 1 G TABLET    Take 1 tablet (1 g total) by mouth 4 (four) times daily. Please give patient instructions for slurry.   Modified Medications   No medications on file    Planned Follow Up No follow-ups on file.   Total Time in Face-to-Face and in Coordination of Care for patient including independent/personal interpretation/review of prior testing, medical history, examination, medication adjustment, communicating results with the patient directly, and documentation within the EHR is 45 minutes.   Corliss Parish, MD [facility] Office # [phone]

## 2022-10-05 NOTE — Patient Instructions (Signed)
We have sent the following medications to your pharmacy for you to pick up at your convenience: Carafate and Nexium    You have been scheduled for an endoscopy. Please follow written instructions given to you at your visit today. If you use inhalers (even only as needed), please bring them with you on the day of your procedure.  Your provider has requested that you go to the basement level for lab work before leaving today. Press "B" on the elevator. The lab is located at the first door on the left as you exit the elevator.  Your provider has requested that you have an abdominal x ray  and Chest X ray before leaving today. Please go to the basement floor to our Radiology department for the test.  Your provider has ordered Cologuard testing as an option for colon cancer screening. This is performed by [facility] and may be out of network with your insurance. PRIOR to completing the test, it is YOUR responsibility to contact your insurance about covered benefits for this test. Your out of pocket expense could be anywhere from $0.00 to $649.00.   When you call to check coverage with your insurer, please provide the following information:   -The ONLY provider of Cologuard is [place] code for Cologuard is [phone].  [employer] NPI # [id number]  -[employer] Tax ID # [id number]   We have already sent your demographic and insurance information to [facility] (phone number [phone]) and they should contact you within the next week regarding your test. If you have not heard from them within the next week, please call our office at [phone].   Due to recent changes in healthcare laws, you may see the results of your imaging and laboratory studies on MyChart before your provider has had a chance to review them.  We understand that in some cases there may be results that are confusing or concerning to you. Not all laboratory results  come back in the same time frame and the provider may be waiting for multiple results in order to interpret others.  Please give [place] 48 hours in order for your provider to thoroughly review all the results before contacting the office for clarification of your results.   Thank you for choosing me and [facility].  Dr. Rush Landmark

## 2022-10-05 NOTE — Progress Notes (Unsigned)
GASTROENTEROLOGY OUTPATIENT CLINIC VISIT   Primary Care Provider [name], NP [address] [place] [address] [phone]  Referring Provider [name], NP [address] [place],  [place] [address] [phone]  Patient Profile: [name] is a 67 y.o. female with a pmh significant for COPD, lung cancer (status post resection), MDD, anxiety, status post hysterectomy, status post appendectomy, previously treated HCV (previous 2020 ultrasound imaging suggested possible cirrhosis), hiatal hernia.  The patient presents to the [facility] for an evaluation and management of problem(s) noted below:  Problem List 1. Gastroesophageal reflux disease, unspecified whether esophagitis present   2. Epigastric pain   3. Pill dysphagia   4. Hiatal hernia   5. Chest pain, unspecified type   6. History of hepatitis C   7. Abnormal ultrasound of liver   8. Colon cancer screening     History of Present Illness This is the patient's first visit to the outpatient [place] clinic in years.  She was previously seen by Dr. Ardis Hughs.  The patient states that she has had abdominal discomfort for quite a while.  However, for the last 3 to 4 months she has been experiencing and midepigastric abdominal discomfort that has been much worse.  She has had associated issues of heartburn for which she takes PPI.  Within the last few weeks she has had chest pain and pressure.  She has had pill dysphagia symptoms.  She at times will have bloating.  She has nausea at times.  She has had 3 episodes of regurgitation of liquids but not occurring around the time of her eating but a few hours later.  Solid foods have not caused her significant issues.  She has had colon cancer screening with Cologuard testing her last Cologuard being done more than 3 years ago.  She would like to continue colon cancer screening with Cologuard as she has been negative before.  She has had  hepatitis C in the past and was treated by infectious disease years ago with negative viral load but it has not been checked in years.  GI Review of Systems Positive as above Negative for alteration of bowel habits, melena, hematochezia  Review of Systems General: Denies fevers/chills/weight loss unintentionally Cardiovascular: Denies current chest pain/palpitations today in clinic Pulmonary: Shortness of breath at baseline Gastroenterological: See HPI Genitourinary: Denies darkened urine Hematological: Denies easy bruising/bleeding Dermatological: Denies jaundice Psychological: Mood is stable   Medications Current Outpatient Medications  Medication Sig Dispense Refill   albuterol (PROVENTIL) (2.5 MG/3ML) 0.083% nebulizer solution Take 3 mLs (2.5 mg total) by nebulization every 4 (four) hours as needed for wheezing or shortness of breath. 75 mL 12   albuterol (VENTOLIN HFA) 108 (90 Base) MCG/ACT inhaler Inhale 2 puffs into the lungs every 6 (six) hours as needed for wheezing or shortness of breath. 8 g 0   Budeson-Glycopyrrol-Formoterol (BREZTRI AEROSPHERE) 160-9-4.8 MCG/ACT AERO Inhale 2 puffs into the lungs 2 (two) times daily. 10.7 g 11   clonazePAM (KLONOPIN) 0.5 MG tablet Take 1 tablet (0.5 mg total) by mouth daily. for anxiety 90 tablet 2   esomeprazole (NEXIUM) 40 MG capsule Take 1 capsule (40 mg total) by mouth 2 (two) times daily before a meal. 60 capsule 2   fluticasone (FLONASE) 50 MCG/ACT nasal spray Place 1 spray into both nostrils daily. 18.2 mL 2   guaiFENesin (MUCINEX) 600 MG 12 hr tablet Take 600 mg by mouth 2 (two) times daily as needed for cough or to  loosen phlegm.     PARoxetine (PAXIL) 40 MG tablet Take 1 tablet (40 mg total) by mouth daily. 90 tablet 1   propranolol ER (INDERAL LA) 60 MG 24 hr capsule Take 1 capsule (60 mg total) by mouth daily. 90 capsule 3   sucralfate (CARAFATE) 1 g tablet Take 1 tablet (1 g total) by mouth 4 (four) times daily. Please give  patient instructions for slurry. 60 tablet 2   traZODone (DESYREL) 100 MG tablet TAKE 1 TABLET BY MOUTH AT BEDTIME AS NEEDED FOR SLEEP 90 tablet 1   No current facility-administered medications for this visit.    Allergies Allergies  Allergen Reactions   Aspirin Shortness Of Breath   Trelegy Ellipta [Fluticasone-Umeclidin-Vilant] Shortness Of Breath    She reports that is made her cough worse.    Morphine Sulfate Other (See Comments)    RED STREAKS ON ARMS.   Oxycodone Itching and Rash   Glycopyrrolate Other (See Comments)    UNSPECIFIED REACTION  pt unsure if she is allergic.   Lactose Intolerance (Gi) Nausea And Vomiting    Milk only.Patient states that she can eat cheese.   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    Hepatitis C    lung ca dx'd 03/2018   Tremor, unspecified    non-specific; takes inderal.    Past Surgical History:  Procedure Laterality Date   ABDOMINAL HYSTERECTOMY  1986   menorrhagia; hx of cryo for abnormal paps   APPENDECTOMY     removed in 1986 per pt   CATARACT EXTRACTION Bilateral 07/2022   INTERCOSTAL NERVE BLOCK Right 12/18/2019   Procedure: Intercostal Nerve Block;   Surgeon: Melrose Nakayama, MD;  Location: [facility];  Service: Thoracic;  Laterality: Right;   LOBECTOMY Left 03/13/2018   Procedure: Lingula section of left upper lobe lobectomy;  Surgeon: Melrose Nakayama, MD;  Location: [facility];  Service: Thoracic;  Laterality: Left;   NODE DISSECTION  12/18/2019   Procedure: Node Dissection;  Surgeon: Melrose Nakayama, MD;  Location: [facility];  Service: Thoracic;;   VIDEO ASSISTED THORACOSCOPY (VATS)/WEDGE RESECTION Left 03/13/2018   Procedure: VIDEO ASSISTED THORACOSCOPY (VATS)/WEDGE RESECTION;  Surgeon: Melrose Nakayama, MD;  Location: [facility];  Service: Thoracic;  Laterality: Left;   Social History   Socioeconomic History   Marital status: Widowed    Spouse name: Not on file   Number of children: Not on file   Years of education: Not on file   Highest education level: 12th grade  Occupational History   Not on file  Tobacco Use   Smoking status: Former  Packs/day: 1.00    Years: 40.00    Total pack years: 40.00    Types: Cigarettes    Quit date: 12/14/2017    Years since quitting: 4.8    Passive exposure: Never   Smokeless tobacco: Never  Vaping Use   Vaping Use: Never used  Substance and Sexual Activity   Alcohol use: Yes    Alcohol/week: 7.0 standard drinks of alcohol    Types: 7 Shots of liquor per week    Comment: occasional bourbon   Drug use: Not Currently   Sexual activity: Never  Other Topics Concern   Not on file  Social History Narrative   Not on file   Social Determinants of Health   Financial Resource Strain: Low Risk  (08/15/2022)   Overall Financial Resource Strain (CARDIA)    Difficulty of Paying Living Expenses: Not hard at all  Food Insecurity: No Food Insecurity (08/15/2022)   Hunger Vital Sign    Worried About Running Out of Food in the Last Year: Never true    Ran Out of Food in the Last Year: Never true  Transportation Needs: No Transportation Needs (08/15/2022)   PRAPARE - Armed forces logistics/support/administrative officer (Medical): No    Lack of Transportation (Non-Medical): No  Physical Activity: Insufficiently Active (08/15/2022)   Exercise Vital Sign    Days of Exercise per Week: 1 day    Minutes of Exercise per Session: 10 min  Stress: No Stress Concern Present (08/15/2022)   [place]    Feeling of Stress : Not at all  Social Connections: Moderately Isolated (08/15/2022)   Social Connection and Isolation Panel [NHANES]    Frequency of Communication with Friends and Family: Twice a week    Frequency of Social Gatherings with Friends and Family: Once a week    Attends Religious Services: 1 to 4 times per year    Active Member of [employer] or Organizations: No    Attends Archivist Meetings: Not on file    Marital Status: Widowed  Intimate Partner Violence: Not At Risk (09/22/2021)   Humiliation, Afraid, Rape, and Kick questionnaire    Fear of Current or Ex-Partner: No    Emotionally Abused: No    Physically Abused: No    Sexually Abused: No   Family History  Problem Relation Age of Onset   Asthma Mother    Alzheimer's disease Mother 66   Hypertension Mother    Other Father        shot    Neuropathy Sister        face   Cancer Maternal Aunt        breast   Cancer Paternal Aunt    Lung cancer Paternal Uncle    Cancer Paternal Grandmother        abdominal   Colon cancer Neg Hx    Esophageal cancer Neg Hx    Inflammatory bowel disease Neg Hx    Liver disease Neg Hx    Pancreatic cancer Neg Hx    Rectal cancer Neg Hx    Stomach cancer Neg Hx    I have reviewed her medical, social, and family history in detail and updated the electronic medical record as necessary.    PHYSICAL EXAMINATION  BP 116/74   Pulse 65   Wt 154 lb (69.9 kg)   BMI 26.43 kg/m  Wt Readings from Last 3 Encounters:  10/05/22 154 lb (69.9 kg)  09/18/22 154 lb 15.7 oz (70.3 kg)  08/31/22 153 lb 14.1 oz (69.8 kg)   GEN: NAD, appears older than stated age, a nontoxic PSYCH: Cooperative, without pressured speech EYE: Conjunctivae pink, sclerae anicteric ENT: MMM CV: Nontachycardic RESP: Mild audible wheezing present GI: NABS, soft, mild tenderness to palpation in MEG, no rebound or guarding  MSK/EXT: No significant lower extremity edema SKIN: No jaundice NEURO:  Alert & Oriented x 3, no focal deficits   REVIEW OF DATA  I reviewed the following data at the time of this encounter:  GI Procedures and Studies  2009 colonoscopy with 2 sessile polyps that returned as hyperplastic so remained average risk  Patient reports Cologuard testing within the last 5 years negative but we do not have access to that  Laboratory Studies  Reviewed those in epic  Imaging Studies  2020 abdominal ultrasound IMPRESSION: Mild changes of cirrhosis stable from the previous ultrasound. No mass lesion is noted.   ASSESSMENT  Ms. Huizar is a 67 y.o. female with a pmh significant for COPD, lung cancer (status post resection), MDD, anxiety, status post hysterectomy, status post appendectomy, previously treated HCV, hiatal hernia.  The patient is seen today for evaluation and management of:  1. Gastroesophageal reflux disease, unspecified whether esophagitis present   2. Epigastric pain   3. Pill dysphagia   4. Hiatal hernia   5. Chest pain, unspecified type   6. History of hepatitis C   7. Abnormal ultrasound of liver   8. Colon cancer screening    Patient is hemodynamically stable.  Clinically she is having progressive midepigastric discomfort as well as dysphagia symptoms.  Further evaluation is required at this time.  We will increase her PPI to twice daily dosing.  She will initiate Carafate slurry dosing at least twice daily.  She will be set up for an upper endoscopy to evaluate the symptoms and ensure we are not missing any other signs of a dysplastic area and evaluate also for the possibility of any  underlying cirrhosis/portal hypertension as result of her previous treated hepatitis C.  Last imaging in 2020 had suggested the possibility of early cirrhosis but her platelet count is normal which makes this seem less likely though not impossible.  Will plan to get updated abdominal imaging to evaluate the spleen size as well.  Will get updated laboratories as well to see and ensure that she still has an SVR.  Will make sure she also has vaccine status to hepatitis A and hepatitis B.  For her abdominal discomfort and pain will further evaluate this with laboratories to ensure no evidence of pancreatitis.  We are going to get an x-ray of her chest as well as her abdomen to ensure no significant abnormalities.  Pending the timing of her endoscopic evaluation which will need to be done in the hospital-based setting due to her severe COPD, she will perform an H. pylori stool antigen.  The risks and benefits of endoscopic evaluation were discussed with the patient; these include but are not limited to the risk of perforation, infection, bleeding, missed lesions, lack of diagnosis, severe illness requiring hospitalization, as well as anesthesia and sedation related illnesses.  The patient and/or family is agreeable to proceed.  All patient questions were answered to the best of my ability, and the patient agrees to the aforementioned plan of action with follow-up as indicated.   PLAN  Laboratories as outlined below KUB to be performed Chest x-ray to be performed H. pylori stool  antigen to be obtained Proceed with diagnostic endoscopy Abdominal ultrasound with elastography to be performed Ensure HAV/HBV immunization status Ensure HCV SVR after prior treatment Increase Nexium to 40 mg twice daily Carafate twice daily slurry   Orders Placed This Encounter  Procedures   Procedural/ Surgical Case Request: ESOPHAGOGASTRODUODENOSCOPY (EGD) WITH PROPOFOL   DG Chest 2 View   DG Abd 2 Views   CBC   Comp Met  (CMET)   Lipase   Hepatitis A Antibody, Total   Cologuard   Hepatitis B Surface AntiBODY   Hepatitis B Surface AntiGEN   Hepatitis B core antibody, total   HCV RNA BY PCR, QN RFX GENO   Helicobacter pylori special antigen   Ambulatory referral to Gastroenterology    New Prescriptions   ESOMEPRAZOLE (NEXIUM) 40 MG CAPSULE    Take 1 capsule (40 mg total) by mouth 2 (two) times daily before a meal.   SUCRALFATE (CARAFATE) 1 G TABLET    Take 1 tablet (1 g total) by mouth 4 (four) times daily. Please give patient instructions for slurry.   Modified Medications   No medications on file    Planned Follow Up No follow-ups on file.   Total Time in Face-to-Face and in Coordination of Care for patient including independent/personal interpretation/review of prior testing, medical history, examination, medication adjustment, communicating results with the patient directly, and documentation within the EHR is 45 minutes.   Justice Britain, MD [facility] Office # [phone]

## 2022-10-06 ENCOUNTER — Encounter: Payer: Self-pay | Admitting: Gastroenterology

## 2022-10-08 LAB — HEPATITIS B SURFACE ANTIBODY,QUALITATIVE: Hep B S Ab: REACTIVE — AB

## 2022-10-08 LAB — HCV RNA BY PCR, QN RFX GENO: HCV Quant Baseline: NOT DETECTED IU/mL

## 2022-10-08 LAB — HEPATITIS A ANTIBODY, TOTAL: Hepatitis A AB,Total: REACTIVE — AB

## 2022-10-08 LAB — HEPATITIS B SURFACE ANTIGEN: Hepatitis B Surface Ag: NONREACTIVE

## 2022-10-08 LAB — HEPATITIS B CORE ANTIBODY, TOTAL: Hep B Core Total Ab: REACTIVE — AB

## 2022-10-09 ENCOUNTER — Encounter (HOSPITAL_COMMUNITY): Payer: Medicare HMO

## 2022-10-09 DIAGNOSIS — K449 Diaphragmatic hernia without obstruction or gangrene: Secondary | ICD-10-CM

## 2022-10-09 DIAGNOSIS — R1013 Epigastric pain: Secondary | ICD-10-CM

## 2022-10-09 DIAGNOSIS — R079 Chest pain, unspecified: Secondary | ICD-10-CM

## 2022-10-09 DIAGNOSIS — R932 Abnormal findings on diagnostic imaging of liver and biliary tract: Secondary | ICD-10-CM

## 2022-10-09 DIAGNOSIS — Z1211 Encounter for screening for malignant neoplasm of colon: Secondary | ICD-10-CM

## 2022-10-09 DIAGNOSIS — Z8619 Personal history of other infectious and parasitic diseases: Secondary | ICD-10-CM

## 2022-10-09 DIAGNOSIS — R1310 Dysphagia, unspecified: Secondary | ICD-10-CM

## 2022-10-09 DIAGNOSIS — R768 Other specified abnormal immunological findings in serum: Secondary | ICD-10-CM | POA: Insufficient documentation

## 2022-10-09 HISTORY — DX: Epigastric pain: R10.13

## 2022-10-09 HISTORY — DX: Personal history of other infectious and parasitic diseases: Z86.19

## 2022-10-09 HISTORY — DX: Abnormal findings on diagnostic imaging of liver and biliary tract: R93.2

## 2022-10-09 HISTORY — DX: Encounter for screening for malignant neoplasm of colon: Z12.11

## 2022-10-09 HISTORY — DX: Diaphragmatic hernia without obstruction or gangrene: K44.9

## 2022-10-09 HISTORY — DX: Dysphagia, unspecified: R13.10

## 2022-10-09 HISTORY — DX: Other specified abnormal immunological findings in serum: R76.8

## 2022-10-09 HISTORY — DX: Chest pain, unspecified: R07.9

## 2022-10-10 ENCOUNTER — Telehealth: Payer: Self-pay

## 2022-10-10 DIAGNOSIS — Z8619 Personal history of other infectious and parasitic diseases: Secondary | ICD-10-CM

## 2022-10-10 DIAGNOSIS — R932 Abnormal findings on diagnostic imaging of liver and biliary tract: Secondary | ICD-10-CM

## 2022-10-10 NOTE — Telephone Encounter (Signed)
-----   Message from Irving Copas., MD sent at 10/09/2022  5:25 AM EST ----- Regarding: Follow-up [name], As I finalized the note on this patient, I realized that she had some prior imaging in 2020 that suggested the possibility of cirrhosis.  Although her labs did not suggest this and she has undergone multiple surgeries, I do think she needs updated ultrasound imaging.  Please schedule her an abdominal ultrasound with liver elastography so that we can look at the liver and the spleen and her fibrosis score.  Thanks. GM

## 2022-10-10 NOTE — Telephone Encounter (Signed)
Order for abdominal ultrasound with liver elastography has been placed. I have left detailed message for patient. She will be contacted by [facility] Scheduling within the next 2 days to be scheduled.

## 2022-10-11 ENCOUNTER — Encounter: Payer: Self-pay | Admitting: Family Medicine

## 2022-10-11 ENCOUNTER — Encounter (HOSPITAL_COMMUNITY): Payer: Medicare HMO

## 2022-10-11 ENCOUNTER — Telehealth (INDEPENDENT_AMBULATORY_CARE_PROVIDER_SITE_OTHER): Payer: Medicare HMO | Admitting: Family Medicine

## 2022-10-11 DIAGNOSIS — Z0000 Encounter for general adult medical examination without abnormal findings: Secondary | ICD-10-CM | POA: Diagnosis not present

## 2022-10-11 NOTE — Progress Notes (Addendum)
PATIENT CHECK-IN and HEALTH RISK ASSESSMENT QUESTIONNAIRE:  -completed by phone/video for upcoming Medicare Preventive Visit  Pre-Visit Check-in: 1)Vitals (height, wt, BP, etc) - record in vitals section for visit on day of visit 2)Review and Update Medications, Allergies PMH, Surgeries, Social history in Epic 3)Hospitalizations in the last year with date/reason? none 4)Review and Update Care Team (patient's specialists) in Epic 5) Complete PHQ9 in Epic  6) Complete Fall Screening in Epic 7)Review all Health Maintenance Due and order under PCP if not done.  8)Medicare Wellness Questionnaire: Answer theses question about your habits: Do you drink alcohol? No I Have you ever smoked?Yes Quit date if applicable? 2020 How many packs a day do/did you smoke? 1 Do you use smokeless tobacco? No Do you use an illicit drugs? Smoked weed quit 2020 Do you exercises? Yes, walking - daily, walks quite bit. 10  minutes daily.  Are you sexually active? no Having gi issues right now so diet is limited as a lot upsets her stomach, see GI for a work up.   Beverages: Unsweet tea, mostly water  Answer theses question about you: Can you perform most household chores? Yes and no, has shortness of breath. Has roommate who helps with the chores.  Do you find it hard to follow a conversation in a noisy room? No Do you often ask people to speak up or repeat themselves? No Do you feel that you have a problem with memory? Occasionally  Do you balance your checkbook and or bank acounts? Yes Do you feel safe at home? Yes Last dentist visit? A couple of years ago 3-4 years Do you need assistance with any of the following: Please note if so   Driving? No  Feeding yourself? No  Getting from bed to chair? No  Getting to the toilet? No  Bathing or showering? No  Dressing yourself? No  Managing money? No  Climbing a flight of stairs Yes, shortness of breath - doesn't have steps at her house  Preparing meals?  Yes, roommate helps with meal prep  Do you have Advanced Directives in place (Living Will, Healthcare Power or [place])? Still has to fill out form   Last eye Exam and location? 07/03/2022   Do you currently use prescribed or non-prescribed narcotic or opioid pain medications? No  Do you have a history or close family history of breast, ovarian, tubal or peritoneal cancer or a family member with BRCA (breast cancer susceptibility 1 and 2) gene mutations? Yes, pt has abnormal cells on cervix   Nurse/Assistant Credentials/time stamp: Petra Kuba, [place] 10/11/2022 4:21PM  ----------------------------------------------------------------------------------------------------------------------------------------------------------------------------------------------------------------------   MEDICARE ANNUAL PREVENTIVE VISIT WITH PROVIDER: (Welcome to [facility], initial annual wellness or annual wellness exam)  Virtual Visit via Phone Note  I connected with [name]  on 10/11/22 by phone and verified that I am speaking with the correct person using two identifiers.  Location patient: home Location provider:work or home office Persons participating in the virtual visit: patient, provider  Concerns and/or follow up today: Seeing GI for some gastro issues. Has scheduled [place] and EGD. Just got her cologard in the mail and plans to do this. Has chronic sob - plans to do pulm rehab once GI workup is done.    See HM section in Epic for other details of completed HM.    ROS: negative for report of fevers, unintentional weight loss, vision changes, vision loss, hearing loss or change, chest pain, change in sob (chronic), hemoptysis, melena, hematochezia, hematuria, genital discharge or lesions, falls,  bleeding or bruising, loc, thoughts of suicide or self harm, memory loss  Patient-completed extensive health risk assessment - reviewed and discussed with the patient: See Health Risk Assessment  completed with patient prior to the visit either above or in recent phone note. This was reviewed in detailed with the patient today and appropriate recommendations, orders and referrals were placed as needed per Summary below and patient instructions.   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    Hepatitis C    lung ca dx'd 03/2018   Tremor, unspecified    non-specific; takes inderal.     Past Surgical History:  Procedure Laterality Date   ABDOMINAL HYSTERECTOMY  1986   menorrhagia; hx of cryo for abnormal paps   APPENDECTOMY     removed in 1986 per pt   CATARACT EXTRACTION Bilateral 07/2022   INTERCOSTAL NERVE BLOCK Right 12/18/2019   Procedure: Intercostal Nerve Block;  Surgeon: Melrose Nakayama, MD;  Location: [place];  Service: Thoracic;  Laterality: Right;   LOBECTOMY Left 03/13/2018   Procedure: Lingula section of left upper lobe lobectomy;  Surgeon: Melrose Nakayama, MD;  Location: [place];  Service: Thoracic;  Laterality: Left;   NODE DISSECTION  12/18/2019   Procedure: Node Dissection;  Surgeon: Melrose Nakayama, MD;  Location: [place];  Service: Thoracic;;   VIDEO ASSISTED THORACOSCOPY (VATS)/WEDGE RESECTION Left 03/13/2018   Procedure: VIDEO ASSISTED THORACOSCOPY (VATS)/WEDGE RESECTION;  Surgeon: Melrose Nakayama, MD;  Location: [facility];  Service:  Thoracic;  Laterality: Left;    Social History   Socioeconomic History   Marital status: Widowed    Spouse name: Not on file   Number of children: Not on file   Years of education: Not on file   Highest education level: 12th grade  Occupational History   Not on file  Tobacco Use   Smoking status: Former    Packs/day: 1.00    Years: 40.00    Total pack years: 40.00    Types: Cigarettes    Quit date: 12/14/2017    Years since quitting: 4.8    Passive exposure: Never   Smokeless tobacco: Never  Vaping Use   Vaping Use: Never used  Substance and Sexual Activity   Alcohol use: Yes    Alcohol/week: 7.0 standard drinks of alcohol    Types: 7 Shots of liquor per week    Comment: occasional bourbon   Drug use: Not Currently   Sexual activity: Never  Other Topics Concern   Not on file  Social History Narrative   Not on file   Social Determinants of Health   Financial Resource Strain: Low Risk  (08/15/2022)   Overall Financial Resource Strain (CARDIA)    Difficulty of Paying Living Expenses: Not hard at all  Food Insecurity: No Food Insecurity (08/15/2022)   Hunger  Vital Sign    Worried About Charity fundraiser in the Last Year: Never true    Ran Out of Food in the Last Year: Never true  Transportation Needs: No Transportation Needs (08/15/2022)   PRAPARE - Hydrologist (Medical): No    Lack of Transportation (Non-Medical): No  Physical Activity: Insufficiently Active (08/15/2022)   Exercise Vital Sign    Days of Exercise per Week: 1 day    Minutes of Exercise per Session: 10 min  Stress: No Stress Concern Present (08/15/2022)   [place]    Feeling of Stress : Not at all  Social Connections: Moderately Isolated (08/15/2022)   Social Connection and Isolation Panel [NHANES]    Frequency of Communication with Friends and Family: Twice a week    Frequency of Social Gatherings  with Friends and Family: Once a week    Attends Religious Services: 1 to 4 times per year    Active Member of [employer] or Organizations: No    Attends Archivist Meetings: Not on file    Marital Status: Widowed  Intimate Partner Violence: Not At Risk (09/22/2021)   Humiliation, Afraid, Rape, and Kick questionnaire    Fear of Current or Ex-Partner: No    Emotionally Abused: No    Physically Abused: No    Sexually Abused: No    Family History  Problem Relation Age of Onset   Asthma Mother    Alzheimer's disease Mother 3   Hypertension Mother    Other Father        shot    Neuropathy Sister        face   Cancer Maternal Aunt        breast   Cancer Paternal Aunt    Lung cancer Paternal Uncle    Cancer Paternal Grandmother        abdominal   Colon cancer Neg Hx    Esophageal cancer Neg Hx    Inflammatory bowel disease Neg Hx    Liver disease Neg Hx    Pancreatic cancer Neg Hx    Rectal cancer Neg Hx    Stomach cancer Neg Hx     Current Outpatient Medications on File Prior to Visit  Medication Sig Dispense Refill   albuterol (PROVENTIL) (2.5 MG/3ML) 0.083% nebulizer solution Take 3 mLs (2.5 mg total) by nebulization every 4 (four) hours as needed for wheezing or shortness of breath. 75 mL 12   albuterol (VENTOLIN HFA) 108 (90 Base) MCG/ACT inhaler Inhale 2 puffs into the lungs every 6 (six) hours as needed for wheezing or shortness of breath. 8 g 0   Budeson-Glycopyrrol-Formoterol (BREZTRI AEROSPHERE) 160-9-4.8 MCG/ACT AERO Inhale 2 puffs into the lungs 2 (two) times daily. 10.7 g 11   clonazePAM (KLONOPIN) 0.5 MG tablet Take 1 tablet (0.5 mg total) by mouth daily. for anxiety 90 tablet 2   esomeprazole (NEXIUM) 40 MG capsule Take 1 capsule (40 mg total) by mouth 2 (two) times daily before a meal. 60 capsule 2   fluticasone (FLONASE) 50 MCG/ACT nasal spray Place 1 spray into both nostrils daily. 18.2 mL 2   guaiFENesin (MUCINEX) 600 MG 12 hr tablet Take 600 mg by mouth  2 (two) times daily as needed for cough or to loosen phlegm.     PARoxetine (PAXIL) 40 MG tablet Take 1 tablet (40 mg total) by mouth daily. 90 tablet 1   propranolol ER (INDERAL LA)  60 MG 24 hr capsule Take 1 capsule (60 mg total) by mouth daily. 90 capsule 3   sucralfate (CARAFATE) 1 g tablet Take 1 tablet (1 g total) by mouth 4 (four) times daily. Please give patient instructions for slurry. 60 tablet 2   traZODone (DESYREL) 100 MG tablet TAKE 1 TABLET BY MOUTH AT BEDTIME AS NEEDED FOR SLEEP 90 tablet 1   No current facility-administered medications on file prior to visit.    Allergies  Allergen Reactions   Aspirin Shortness Of Breath   Trelegy Ellipta [Fluticasone-Umeclidin-Vilant] Shortness Of Breath    She reports that is made her cough worse.    Morphine Sulfate Other (See Comments)    RED STREAKS ON ARMS.   Oxycodone Itching and Rash   Glycopyrrolate Other (See Comments)    UNSPECIFIED REACTION  pt unsure if she is allergic.   Lactose Intolerance (Gi) Nausea And Vomiting    Milk only.Patient states that she can eat cheese.   Penicillins Other (See Comments)     Heartburn, upset stomach only & tolerated AMOXIL after this reaction Has patient had a PCN reaction causing immediate rash, facial/tongue/throat swelling, SOB or lightheadedness with hypotension: No Has patient had a PCN reaction causing severe rash involving mucus membranes or skin necrosis: No Has patient had a PCN reaction that required hospitalization: No Has patient had a PCN reaction occurring within the last 10 years: #  #  #  YES  #  #  #    Sulfa Antibiotics Tinitus       Physical Exam There were no vitals filed for this visit. Estimated body mass index is 26.43 kg/m as calculated from the following:   Height as of 08/31/22: 5\' 4"  (1.626 m).   Weight as of 10/05/22: 154 lb (69.9 kg).  EKG (optional): deferred due to virtual visit  GENERAL: alert, oriented, no audible sounds of distress, vision exam  deferred due to virtual visit  PSYCH/NEURO: pleasant and cooperative, no obvious depression or anxiety, speech and thought processing grossly intact, Cognitive function grossly intact  Flowsheet Row Video Visit from 10/11/2022 in [facility]  PHQ-9 Total Score 0           10/11/2022    4:13 PM 08/31/2022    1:37 PM 05/23/2022   11:46 AM 02/21/2022    3:36 PM 01/03/2022    9:26 AM  Depression screen PHQ 2/9  Decreased Interest 0 0 3 0 0  Down, Depressed, Hopeless 0 0 3 0 0  PHQ - 2 Score 0 0 6 0 0  Altered sleeping 0 2 0 1 0  Tired, decreased energy 0 1 1 3  0  Change in appetite 0 0 0 0 0  Feeling bad or failure about yourself  0 0 1 0 0  Trouble concentrating 0 0 0 0 0  Moving slowly or fidgety/restless 0 0 0 0 0  Suicidal thoughts 0 0 0 0 0  PHQ-9 Score 0 3 8 4  0  Difficult doing work/chores Not difficult at all Not difficult at all Somewhat difficult Not difficult at all Not difficult at all       01/03/2022    9:23 AM 02/21/2022    1:47 PM 07/26/2022    9:28 AM 08/31/2022    1:36 PM 10/11/2022    4:13 PM  Fall Risk  Falls in the past year? 0 0  0 0  Was there an injury with Fall?    0 0  Fall Risk Category Calculator    0 0  Fall Risk Category    Low Low  Patient Fall Risk Level   High fall risk Low fall risk Low fall risk  Patient at Risk for Falls Due to    No Fall Risks No Fall Risks  Patient at Risk for Falls Due to - Comments Becomes dizzy out of the blue      Fall risk Follow up Falls prevention discussed   Falls evaluation completed;Education provided;Falls prevention discussed Falls evaluation completed     SUMMARY AND PLAN:  Medicare annual wellness visit, subsequent   Discussed applicable health maintenance/preventive health measures and advised and referred or ordered per patient preferences:  Health Maintenance  Topic Date Due   Zoster Vaccines- Shingrix (1 of 2) Never done, she has this on the to do list   MAMMOGRAM  Reports not  done in > 20 years, she prefers to do self exams. Declines mammogram today.    Cologard 12/29/2017 - she is doing this with her GI doc, just received the kit   DEXA SCAN  Never done, she does not wish to do this right now as has too many other things going on. Advised to let us know when she is ready so that we can assist in scheduling.    INFLUENZA VACCINE  Reports done at [facility] last month, updated   Medicare Annual Wellness (AWV)  Done today   DTaP/Tdap/Td (2 - Td or Tdap) 01/22/2024   Pneumonia Vaccine 35+ Years old (3 - PPSV23 or PCV20) 09/03/2024   Hepatitis C Screening  Completed   HPV VACCINES  Aged Out   COVID-19 Vaccine  She plans to get this. Not sure why was discontinued in epic prior to visit.     Education and counseling on the following was provided based on the above review of health and a plan/checklist for the patient, along with additional information discussed, was provided for the patient in the patient instructions :  -Advised on importance of and resources for completing advanced directives. Information provided. She plans to complete. -Advised and counseled on healthy lifestyle - including the importance of a healthy diet, regular physical activity, etc -Provided balance exercises and counseled on how to do safely as she sometimes feels has some issues with this -counseled on exercise and nutrition (info provided in handout, diet limited right now due to GI issues, undergoing workup with Gastroenterology. ) She plans to do pulm rehab once over theses gi issues. I think this would be really good if she could do that.  -Advise yearly dental visits at minimum and regular eye exams   Follow up: see patient instructions     Patient Instructions  I really enjoyed getting to talk with you today! I am available on Tuesdays and Thursdays for virtual visits if you have any questions or concerns, or if I can be of any further assistance.   CHECKLIST FROM ANNUAL WELLNESS  VISIT:  -Follow up (please call to schedule if not scheduled after visit):   -yearly for annual wellness visit with primary care office  Here is a list of your preventive care/health maintenance measures and the plan for each if any are due:  Health Maintenance  Topic Date Due   Zoster Vaccines- Shingrix (1 of 2) Never done, please send copy of record to our chart once done so that we can update   Fecal DNA (Cologuard)  Never done, please complete and send copy of results to  our office to update in your chart   DEXA SCAN  Never done, please let us know when you wish to do this and we can assist.    MAMMOGRAM  10/12/2023, you have declined this today, please do regular self breast exams monthly, seek immediate medical care if you notice any abnormalities/lumps or bumps. Also, please let us know if you ever change you mind and you wish to do this and we can assist in scheduling.    Medicare Annual Wellness (AWV)  10/12/2023   DTaP/Tdap/Td (2 - Td or Tdap) 01/22/2024   Pneumonia Vaccine 25+ Years old (3 - PPSV23 or PCV20) 09/03/2024   INFLUENZA VACCINE  Completed   Hepatitis C Screening  Completed   HPV VACCINES  Aged Out   COLONOSCOPY (Pts 45-47yrs Insurance coverage will need to be confirmed)  Discontinued   COVID-19 Vaccine  Due yearly, please let us know if you get this so that we can update our records    -See a dentist at least yearly  -Get your eyes checked and then per your eye specialist's recommendations  -Other issues addressed today:   -I have included below further information regarding a healthy whole foods based diet, physical activity guidelines for adults, stress management and opportunities for social connections. I hope you find this information useful.    -----------------------------------------------------------------------------------------------------------------------------------------------------------------------------------------------------------------------------------------------------------  NUTRITION: -eat real food: lots of colorful vegetables (half the plate) and fruits -5-7 servings of vegetables and fruits per day (fresh or steamed is best), exp. 2 servings of vegetables with lunch and dinner and 2 servings of fruit per day. Berries and greens such as kale and collards are great choices.  -consume on a regular basis: whole grains (make sure first ingredient on label contains the word "whole"), fresh fruits, fish, nuts, seeds, healthy oils (such as olive oil, avocado oil, grape seed oil) -may eat small amounts of dairy and lean meat on occasion, but avoid processed meats such as ham, bacon, lunch meat, etc. -drink water -try to avoid fast food and pre-packaged foods, processed meat -most experts advise limiting sodium to < 2300mg  per day, should limit further is any chronic conditions such as high blood pressure, heart disease, diabetes, etc. The American Heart Association advised that < 1500mg  is is ideal -try to avoid foods that contain any ingredients with names you do not recognize  -try to avoid sugar/sweets (except for the natural sugar that occurs in fresh fruit) -try to avoid sweet drinks -try to avoid white rice, white bread, pasta (unless whole grain), white or yellow potatoes  EXERCISE GUIDELINES FOR ADULTS: -if you wish to increase your physical activity, do so gradually and with the approval of your doctor -STOP and seek medical care immediately if you have any chest pain, chest discomfort or trouble breathing when starting or increasing exercise  -move and stretch your body, legs, feet and arms when sitting for long periods -Physical activity guidelines for optimal health in adults: -least 150 minutes per week of  aerobic exercise (can talk, but not sing) once approved by your doctor, 20-30 minutes of sustained activity or two 10 minute episodes of sustained activity every day.  -resistance training at least 2 days per week if approved by your doctor -balance exercises 3+ days per week:   Stand somewhere where you have something sturdy to hold onto if you lose balance.    1) lift up on toes, start with 5x per day and work up to 20x   2) stand and  lift on leg straight out to the side so that foot is a few inches of the floor, start with 5x each side and work up to 20x each side   3) stand on one foot, start with 5 seconds each side and work up to 20 seconds on each side  If you need ideas or help with getting more active:  -Silver sneakers https://tools.silversneakers.com  -Walk with a Doc: http://stephens-thompson.biz/  -try to include resistance (weight lifting/strength building) and balance exercises twice per week: or the following link for ideas: ChessContest.fr  UpdateClothing.com.cy  STRESS MANAGEMENT: -can try meditating, or just sitting quietly with deep breathing while intentionally relaxing all parts of your body for 5 minutes daily -if you need further help with stress, anxiety or depression please follow up with your primary doctor or contact the wonderful folks at [facility]: [place]: -options in [place] if you wish to engage in more social and exercise related activities:  -Silver sneakers https://tools.silversneakers.com  -Walk with a Doc: http://stephens-thompson.biz/  -Check out the [place] 50+ section on the [place] [employer] (hiking clubs, book clubs, cards and games, chess, exercise classes, aquatic classes and much more) - see the website for  details: https://www.[place].gov/departments/parks-recreation/active-adults50  -YouTube has lots of exercise videos for different ages and abilities as well  -[place] (a variety of indoor and outdoor inperson activities for adults). [phone]. [address]  -Virtual Online Classes (a variety of topics): see seniorplanet.org or call [phone]  -consider volunteering at a school, hospice center, church, senior center or elsewhere    ADVANCED HEALTHCARE DIRECTIVES:  Everyone should have advanced health care directives in place. This is so that you get the care you want, should you ever be in a situation where you are unable to make your own medical decisions.   From the [place] Advanced Directive Website: "[place] are legal documents in which you give written instructions about your health care if, in the future, you cannot speak for yourself.   A health care power of attorney allows you to name a person you trust to make your health care decisions if you cannot make them yourself. A declaration of a desire for a natural death (or living will) is document, which states that you desire not to have your life prolonged by extraordinary measures if you have a terminal or incurable illness or if you are in a vegetative state. An advance instruction for mental health treatment makes a declaration of instructions, information and preferences regarding your mental health treatment. It also states that you are aware that the advance instruction authorizes a mental health treatment provider to act according to your wishes. It may also outline your consent or refusal of mental health treatment. A declaration of an anatomical gift allows anyone over the age of 17 to make a gift by will, organ donor card or other document."   Please see the following website or an elder law attorney for forms, FAQs and for completion of advanced directives: [place] Secretary of [place] ([email or web address].no)  Or copy and paste the following to your web browser: PokerReunion.com.cy         Lucretia Kern, DO

## 2022-10-11 NOTE — Patient Instructions (Signed)
I really enjoyed getting to talk with you today! I am available on Tuesdays and Thursdays for virtual visits if you have any questions or concerns, or if I can be of any further assistance.   CHECKLIST FROM ANNUAL WELLNESS VISIT:  -Follow up (please call to schedule if not scheduled after visit):   -yearly for annual wellness visit with primary care office  Here is a list of your preventive care/health maintenance measures and the plan for each if any are due:  Health Maintenance  Topic Date Due   Zoster Vaccines- Shingrix (1 of 2) Never done, please send copy of record to our chart once done so that we can update   Fecal DNA (Cologuard)  Never done, please complete and send copy of results to our office to update in your chart   DEXA SCAN  Never done, please let us know when you wish to do this and we can assist.    MAMMOGRAM  10/12/2023, you have declined this today, please do regular self breast exams monthly, seek immediate medical care if you notice any abnormalities/lumps or bumps. Also, please let us know if you ever change you mind and you wish to do this and we can assist in scheduling.    Medicare Annual Wellness (AWV)  10/12/2023   DTaP/Tdap/Td (2 - Td or Tdap) 01/22/2024   Pneumonia Vaccine 47+ Years old (3 - PPSV23 or PCV20) 09/03/2024   INFLUENZA VACCINE  Completed   Hepatitis C Screening  Completed   HPV VACCINES  Aged Out   COLONOSCOPY (Pts 45-20yrs Insurance coverage will need to be confirmed)  Discontinued   COVID-19 Vaccine  Due yearly, please let us know if you get this so that we can update our records    -See a dentist at least yearly  -Get your eyes checked and then per your eye specialist's recommendations  -Other issues addressed today:   -I have included below further information regarding a healthy whole foods based diet, physical activity guidelines for adults, stress management and opportunities for social connections. I hope you find this information  useful.   -----------------------------------------------------------------------------------------------------------------------------------------------------------------------------------------------------------------------------------------------------------  NUTRITION: -eat real food: lots of colorful vegetables (half the plate) and fruits -5-7 servings of vegetables and fruits per day (fresh or steamed is best), exp. 2 servings of vegetables with lunch and dinner and 2 servings of fruit per day. Berries and greens such as kale and collards are great choices.  -consume on a regular basis: whole grains (make sure first ingredient on label contains the word "whole"), fresh fruits, fish, nuts, seeds, healthy oils (such as olive oil, avocado oil, grape seed oil) -may eat small amounts of dairy and lean meat on occasion, but avoid processed meats such as ham, bacon, lunch meat, etc. -drink water -try to avoid fast food and pre-packaged foods, processed meat -most experts advise limiting sodium to < 2300mg  per day, should limit further is any chronic conditions such as high blood pressure, heart disease, diabetes, etc. The American Heart Association advised that < 1500mg  is is ideal -try to avoid foods that contain any ingredients with names you do not recognize  -try to avoid sugar/sweets (except for the natural sugar that occurs in fresh fruit) -try to avoid sweet drinks -try to avoid white rice, white bread, pasta (unless whole grain), white or yellow potatoes  EXERCISE GUIDELINES FOR ADULTS: -if you wish to increase your physical activity, do so gradually and with the approval of your doctor -STOP and seek medical care  immediately if you have any chest pain, chest discomfort or trouble breathing when starting or increasing exercise  -move and stretch your body, legs, feet and arms when sitting for long periods -Physical activity guidelines for optimal health in adults: -least 150 minutes per  week of aerobic exercise (can talk, but not sing) once approved by your doctor, 20-30 minutes of sustained activity or two 10 minute episodes of sustained activity every day.  -resistance training at least 2 days per week if approved by your doctor -balance exercises 3+ days per week:   Stand somewhere where you have something sturdy to hold onto if you lose balance.    1) lift up on toes, start with 5x per day and work up to 20x   2) stand and lift on leg straight out to the side so that foot is a few inches of the floor, start with 5x each side and work up to 20x each side   3) stand on one foot, start with 5 seconds each side and work up to 20 seconds on each side  If you need ideas or help with getting more active:  -Silver sneakers https://tools.silversneakers.com  -Walk with a Doc: http://stephens-thompson.biz/  -try to include resistance (weight lifting/strength building) and balance exercises twice per week: or the following link for ideas: ChessContest.fr  UpdateClothing.com.cy  STRESS MANAGEMENT: -can try meditating, or just sitting quietly with deep breathing while intentionally relaxing all parts of your body for 5 minutes daily -if you need further help with stress, anxiety or depression please follow up with your primary doctor or contact the wonderful folks at [place]: [place]: -options in [place] if you wish to engage in more social and exercise related activities:  -Silver sneakers https://tools.silversneakers.com  -Walk with a Doc: http://stephens-thompson.biz/  -Check out the [place] 50+ section on the [place] [employer] (hiking clubs, book clubs, cards and games, chess, exercise classes, aquatic classes and much more) - see the website for  details: https://www.[place].gov/departments/parks-recreation/active-adults50  -YouTube has lots of exercise videos for different ages and abilities as well  -[place] (a variety of indoor and outdoor inperson activities for adults). ([phone]. [address].  -Virtual Online Classes (a variety of topics): see seniorplanet.org or call 404-674-5598  -consider volunteering at a school, hospice center, church, senior center or elsewhere    ADVANCED HEALTHCARE DIRECTIVES:  Everyone should have advanced health care directives in place. This is so that you get the care you want, should you ever be in a situation where you are unable to make your own medical decisions.   From the [place] Advanced Directive Website: "[place] are legal documents in which you give written instructions about your health care if, in the future, you cannot speak for yourself.   A health care power of attorney allows you to name a person you trust to make your health care decisions if you cannot make them yourself. A declaration of a desire for a natural death (or living will) is document, which states that you desire not to have your life prolonged by extraordinary measures if you have a terminal or incurable illness or if you are in a vegetative state. An advance instruction for mental health treatment makes a declaration of instructions, information and preferences regarding your mental health treatment. It also states that you are aware that the advance instruction authorizes a mental health treatment provider to act according to your wishes. It may  also outline your consent or refusal of mental health treatment. A declaration of an anatomical gift allows anyone over the age of 73 to make a gift by will, organ donor card or other document."   Please see the following website or an elder law attorney for forms, FAQs and for completion of advanced directives: [place] Secretary of [place] ([email or web address].no)  Or copy and paste the following to your web browser: [email or web address].cy

## 2022-10-12 ENCOUNTER — Other Ambulatory Visit: Payer: Self-pay | Admitting: Nurse Practitioner

## 2022-10-12 DIAGNOSIS — J310 Chronic rhinitis: Secondary | ICD-10-CM

## 2022-10-16 ENCOUNTER — Encounter (HOSPITAL_COMMUNITY): Payer: Medicare HMO

## 2022-10-18 ENCOUNTER — Telehealth: Payer: Self-pay

## 2022-10-18 ENCOUNTER — Encounter (HOSPITAL_COMMUNITY): Payer: Medicare HMO

## 2022-10-18 NOTE — Telephone Encounter (Signed)
Left message on machine to call back  

## 2022-10-18 NOTE — Telephone Encounter (Signed)
-----  Message from Lemar Lofty., MD sent at 10/09/2022  5:25 AM EST ----- Regarding: Follow-up [name], As I finalized the note on this patient, I realized that she had some prior imaging in 2020 that suggested the possibility of cirrhosis.  Although her labs did not suggest this and she has undergone multiple surgeries, I do think she needs updated ultrasound imaging.  Please schedule her an abdominal ultrasound with liver elastography so that we can look at the liver and the spleen and her fibrosis score.  Thanks. GM

## 2022-10-19 NOTE — Telephone Encounter (Signed)
Left message on machine to call back  

## 2022-10-22 NOTE — Telephone Encounter (Signed)
The pt has been advised and [place] order sent to the schedulers.

## 2022-10-23 ENCOUNTER — Encounter (HOSPITAL_COMMUNITY): Payer: Medicare HMO

## 2022-10-24 ENCOUNTER — Encounter (HOSPITAL_COMMUNITY): Payer: Self-pay | Admitting: Gastroenterology

## 2022-10-25 ENCOUNTER — Other Ambulatory Visit: Payer: Self-pay | Admitting: Adult Health

## 2022-10-25 ENCOUNTER — Encounter (HOSPITAL_COMMUNITY): Payer: Medicare HMO

## 2022-10-26 NOTE — Telephone Encounter (Signed)
Okay for refill?  

## 2022-10-30 ENCOUNTER — Encounter (HOSPITAL_COMMUNITY): Payer: Medicare HMO

## 2022-10-31 ENCOUNTER — Ambulatory Visit (HOSPITAL_COMMUNITY): Payer: Medicare HMO | Admitting: Certified Registered Nurse Anesthetist

## 2022-10-31 ENCOUNTER — Encounter (HOSPITAL_COMMUNITY)
Admission: RE | Disposition: A | Discharge status: Home / Self Care | Payer: Self-pay | Source: Ambulatory Visit | Attending: Gastroenterology

## 2022-10-31 ENCOUNTER — Ambulatory Visit (HOSPITAL_COMMUNITY)
Admission: RE | Admit: 2022-10-31 | Discharge: 2022-10-31 | Disposition: A | Discharge status: Home / Self Care | Payer: Medicare HMO | Source: Ambulatory Visit | Attending: Gastroenterology | Admitting: Gastroenterology

## 2022-10-31 ENCOUNTER — Ambulatory Visit (HOSPITAL_BASED_OUTPATIENT_CLINIC_OR_DEPARTMENT_OTHER): Payer: Medicare HMO | Admitting: Certified Registered Nurse Anesthetist

## 2022-10-31 ENCOUNTER — Encounter (HOSPITAL_COMMUNITY): Payer: Self-pay | Admitting: Gastroenterology

## 2022-10-31 DIAGNOSIS — K2289 Other specified disease of esophagus: Secondary | ICD-10-CM

## 2022-10-31 DIAGNOSIS — K449 Diaphragmatic hernia without obstruction or gangrene: Secondary | ICD-10-CM | POA: Diagnosis not present

## 2022-10-31 DIAGNOSIS — K222 Esophageal obstruction: Secondary | ICD-10-CM

## 2022-10-31 DIAGNOSIS — R1013 Epigastric pain: Secondary | ICD-10-CM | POA: Diagnosis present

## 2022-10-31 DIAGNOSIS — Z87891 Personal history of nicotine dependence: Secondary | ICD-10-CM | POA: Insufficient documentation

## 2022-10-31 DIAGNOSIS — Z8619 Personal history of other infectious and parasitic diseases: Secondary | ICD-10-CM | POA: Diagnosis not present

## 2022-10-31 DIAGNOSIS — F419 Anxiety disorder, unspecified: Secondary | ICD-10-CM | POA: Insufficient documentation

## 2022-10-31 DIAGNOSIS — J45909 Unspecified asthma, uncomplicated: Secondary | ICD-10-CM

## 2022-10-31 DIAGNOSIS — K219 Gastro-esophageal reflux disease without esophagitis: Secondary | ICD-10-CM

## 2022-10-31 DIAGNOSIS — K3189 Other diseases of stomach and duodenum: Secondary | ICD-10-CM | POA: Insufficient documentation

## 2022-10-31 DIAGNOSIS — Z79899 Other long term (current) drug therapy: Secondary | ICD-10-CM | POA: Insufficient documentation

## 2022-10-31 DIAGNOSIS — F32A Depression, unspecified: Secondary | ICD-10-CM | POA: Diagnosis not present

## 2022-10-31 DIAGNOSIS — Z85118 Personal history of other malignant neoplasm of bronchus and lung: Secondary | ICD-10-CM | POA: Insufficient documentation

## 2022-10-31 DIAGNOSIS — R079 Chest pain, unspecified: Secondary | ICD-10-CM

## 2022-10-31 DIAGNOSIS — J439 Emphysema, unspecified: Secondary | ICD-10-CM | POA: Insufficient documentation

## 2022-10-31 DIAGNOSIS — Z1211 Encounter for screening for malignant neoplasm of colon: Secondary | ICD-10-CM

## 2022-10-31 DIAGNOSIS — F329 Major depressive disorder, single episode, unspecified: Secondary | ICD-10-CM | POA: Insufficient documentation

## 2022-10-31 DIAGNOSIS — R1310 Dysphagia, unspecified: Secondary | ICD-10-CM | POA: Diagnosis not present

## 2022-10-31 HISTORY — PX: BIOPSY: SHX5522

## 2022-10-31 HISTORY — PX: ESOPHAGOGASTRODUODENOSCOPY (EGD) WITH PROPOFOL: SHX5813

## 2022-10-31 HISTORY — PX: SAVORY DILATION: SHX5439

## 2022-10-31 SURGERY — ESOPHAGOGASTRODUODENOSCOPY (EGD) WITH PROPOFOL
Anesthesia: Monitor Anesthesia Care

## 2022-10-31 MED ORDER — SODIUM CHLORIDE 0.9 % IV SOLN: INTRAVENOUS | Status: DC

## 2022-10-31 MED ORDER — LACTATED RINGERS IV SOLN
INTRAVENOUS | Status: AC | PRN
  Administered 2022-10-31: 1000 mL via INTRAVENOUS

## 2022-10-31 MED ORDER — PROPOFOL 1000 MG/100ML IV EMUL
INTRAVENOUS | Status: AC
  Filled 2022-10-31: qty 100

## 2022-10-31 MED ORDER — LIDOCAINE 2% (20 MG/ML) 5 ML SYRINGE
INTRAMUSCULAR | Status: DC | PRN
  Administered 2022-10-31: 100 mg via INTRAVENOUS

## 2022-10-31 MED ORDER — PROPOFOL 10 MG/ML IV BOLUS
INTRAVENOUS | Status: DC | PRN
  Administered 2022-10-31: 50 mg via INTRAVENOUS
  Administered 2022-10-31: 20 mg via INTRAVENOUS
  Administered 2022-10-31: 30 mg via INTRAVENOUS

## 2022-10-31 MED ORDER — PROPOFOL 500 MG/50ML IV EMUL
INTRAVENOUS | Status: DC | PRN
  Administered 2022-10-31: 125 ug/kg/min via INTRAVENOUS

## 2022-10-31 SURGICAL SUPPLY — 15 items

## 2022-10-31 NOTE — Transfer of Care (Signed)
Immediate Anesthesia Transfer of Care Note  Patient: [name]  Procedure(s) Performed: Procedure(s): ESOPHAGOGASTRODUODENOSCOPY (EGD) WITH PROPOFOL (N/A) BIOPSY SAVORY DILATION (N/A)  Patient Location: PACU  Anesthesia Type:MAC  Level of Consciousness: Patient easily awoken, sedated, comfortable, cooperative, following commands, responds to stimulation.   Airway & Oxygen Therapy: Patient spontaneously breathing, ventilating well, oxygen via simple oxygen mask.  Post-op Assessment: Report given to PACU RN, vital signs reviewed and stable, moving all extremities.   Post vital signs: Reviewed and stable.  Complications: No apparent anesthesia complications   Last Vitals:  Vitals Value Taken Time  BP 95/39 10/31/22 1115  Temp 36.2 C 10/31/22 1110  Pulse 70 10/31/22 1116  Resp 22 10/31/22 1116  SpO2 100 % 10/31/22 1116  Vitals shown include unvalidated device data.  Last Pain:  Vitals:   10/31/22 1006  TempSrc: Tympanic  PainSc: 0-No pain         Complications: No notable events documented.

## 2022-10-31 NOTE — Op Note (Signed)
[facility] Patient Name: [name] Procedure Date: 10/31/2022 MRN: [id number] Attending MD: Justice Britain , MD, [id number] Date of Birth: Mar 03, 1956 CSN: [id number] Age: 67 Admit Type: Outpatient Procedure:                Upper GI endoscopy Indications:              Epigastric abdominal pain, Dysphagia, Heartburn Providers:                Justice Britain, MD, Dulcy Fanny, Frazier Richards, Technician Referring MD:             [name] Medicines:                Monitored Anesthesia Care Complications:            No immediate complications. Estimated Blood Loss:     Estimated blood loss was minimal. Procedure:                Pre-Anesthesia Assessment:                           - Prior to the procedure, a History and Physical                            was performed, and patient medications and                            allergies were reviewed. The patient's tolerance of                            previous anesthesia was also reviewed. The risks                            and benefits of the procedure and the sedation                            options and risks were discussed with the patient.                            All questions were answered, and informed consent                            was obtained. Prior Anticoagulants: The patient has                            taken no anticoagulant or antiplatelet agents. ASA                            Grade Assessment: III - A patient with severe                            systemic disease. After reviewing the risks and  benefits, the patient was deemed in satisfactory                            condition to undergo the procedure.                           After obtaining informed consent, the endoscope was                            passed under direct vision. Throughout the                            procedure, the patient's blood pressure, pulse,  and                            oxygen saturations were monitored continuously. The                            GIF-H190 ([id number]) Olympus endoscope was introduced                            through the mouth, and advanced to the second part                            of duodenum. The upper GI endoscopy was                            accomplished without difficulty. The patient                            tolerated the procedure. Scope In: Scope Out: Findings:      No gross lesions were noted in the majority of the esophagus. Biopsies       were taken with a cold forceps for histology to rule out EOE/LOE.      Scattered islands of salmon-colored mucosa were present from 34 to 35       cm. No other visible abnormalities were present. Biopsies were taken       with a cold forceps for histology to rule in/out Barrett's.      The Z-line was irregular and was found 35 cm from the incisors.      A non-obstructing Schatzki ring was found at the gastroesophageal       junction. Disruption biopsies were taken circumferentially..      After the rest of the EGD was completed, a guidewire was placed and the       scope was withdrawn. Dilation was performed in the esophagus with a       Savary dilator with moderate resistance at 16 mm. The esophagus was       examined following endoscope reinsertion and showed moderate mucosal       disruption (mucosal wrent) just below the UES, moderate improvement in       luminal narrowing and no perforation. There was no change to the       previously disrupted Schatzki ring.      A 4 cm hiatal hernia was present.      Patchy mildly erythematous mucosa  without bleeding was found in the       entire examined stomach. Biopsies were taken with a cold forceps for       histology and Helicobacter pylori testing.      No gross lesions were noted in the duodenal bulb, in the first portion       of the duodenum and in the second portion of the duodenum. Impression:                - No gross lesions in the majority of the                            esophagus. Biopsied.                           - Salmon-colored mucosal islands in the very distal                            esophagus were noted, suspicious for short-segment                            Barrett's esophagus. Biopsied.                           - Z-line irregular, 35 cm from the incisors.                           - Non-obstructing Schatzki ring. Disrupted.                           - After the completion of the EGD, a savory                            dilation to 16 mm was performed with moderate                            resistance leading to a mucosal wrent just below                            the UES.                           - 4 cm hiatal hernia.                           - Erythematous mucosa in the stomach. Biopsied.                           - No gross lesions in the duodenal bulb, in the                            first portion of the duodenum and in the second                            portion of the duodenum. Moderate Sedation:      Not Applicable - Patient had care per  Anesthesia. Recommendation:           - The patient will be observed post-procedure,                            until all discharge criteria are met.                           - Discharge patient to home.                           - Dilation diet as per protocol.                           - Please use Cepacol or Halls Lozenges +/-                            Chloraseptic spray for next 72-96 hours to aid in                            sore thoat should you experience this.                           - Monitor for signs/symptoms of bleeding,                            perforation, and infection. If issues please call                            our number to get further assistance as needed.                           - Continue present medications (including PPI twice                            daily as well as Carafate  2-4 times daily).                            Recommend that Carafate be dissolved and used as a                            slurry.                           - Await pathology results.                           - Repeat upper endoscopy PRN for retreatment. If                            patient has good effectiveness could consider                            repeating dilation and trying to get up to 18 mm in  the future.                           - The findings and recommendations were discussed                            with the patient.                           - The findings and recommendations were discussed                            with the designated responsible adult. Procedure Code(s):        --- Professional ---                           [phone], Esophagogastroduodenoscopy, flexible,                            transoral; with insertion of guide wire followed by                            passage of dilator(s) through esophagus over guide                            wire                           43239, 10, Esophagogastroduodenoscopy, flexible,                            transoral; with biopsy, single or multiple Diagnosis Code(s):        --- Professional ---                           K22.89, Other specified disease of esophagus                           K22.2, Esophageal obstruction                           K44.9, Diaphragmatic hernia without obstruction or                            gangrene                           K31.89, Other diseases of stomach and duodenum                           R10.13, Epigastric pain                           R13.10, Dysphagia, unspecified                           R12, Heartburn CPT copyright 2022 American Medical Association. All rights reserved. The codes documented in this report are preliminary  and upon coder review may  be revised to meet current compliance requirements. Justice Britain, MD 10/31/2022 11:13:05  AM Number of Addenda: 0

## 2022-10-31 NOTE — Interval H&P Note (Signed)
History and Physical Interval Note:  10/31/2022 9:56 AM  [name]  has presented today for surgery, with the diagnosis of Gerd, Abdominal Pain.  The various methods of treatment have been discussed with the patient and family. After consideration of risks, benefits and other options for treatment, the patient has consented to  Procedure(s): ESOPHAGOGASTRODUODENOSCOPY (EGD) WITH PROPOFOL (N/A) as a surgical intervention.  The patient's history has been reviewed, patient examined, no change in status, stable for surgery.  I have reviewed the patient's chart and labs.  Questions were answered to the patient's satisfaction.     [employer]

## 2022-10-31 NOTE — Anesthesia Preprocedure Evaluation (Addendum)
Anesthesia Evaluation  Patient identified by MRN, date of birth, ID band Patient awake    Reviewed: Allergy & Precautions, NPO status , Patient's Chart, lab work & pertinent test results  History of Anesthesia Complications (+) Family history of anesthesia reactionNegative for: history of anesthetic complications  Airway Mallampati: II  TM Distance: >3 FB Neck ROM: Full    Dental  (+) Dental Advisory Given, Teeth Intact   Pulmonary shortness of breath, asthma , COPD, Patient abstained from smoking., former smoker RLL LUNG NODULES   breath sounds clear to auscultation       Cardiovascular negative cardio ROS  Rhythm:Regular     Neuro/Psych  Headaches PSYCHIATRIC DISORDERS Anxiety Depression     Neuromuscular disease    GI/Hepatic hiatal hernia,GERD  ,,(+) Hepatitis -, C  Endo/Other    Renal/GU      Musculoskeletal  (+) Arthritis ,    Abdominal   Peds  Hematology   Anesthesia Other Findings   Reproductive/Obstetrics                              Anesthesia Physical Anesthesia Plan  ASA: 3  Anesthesia Plan: MAC   Post-op Pain Management: Minimal or no pain anticipated   Induction: Intravenous  PONV Risk Score and Plan: 3 and Propofol infusion  Airway Management Planned: Natural Airway and Simple Face Mask  Additional Equipment: None  Intra-op Plan:   Post-operative Plan: Extubation in OR  Informed Consent: I have reviewed the patients History and Physical, chart, labs and discussed the procedure including the risks, benefits and alternatives for the proposed anesthesia with the patient or authorized representative who has indicated his/her understanding and acceptance.     Dental advisory given  Plan Discussed with: CRNA, Surgeon and Anesthesiologist  Anesthesia Plan Comments: (Hx of COPD, stage IB(T2N0) adenocarcinoma, chronic bronchitis, and hepatitis C. She had a  lingular segmentectomy for a stage IB adenocarcinoma in June of 2019. )         Anesthesia Quick Evaluation

## 2022-10-31 NOTE — Anesthesia Postprocedure Evaluation (Signed)
Anesthesia Post Note  Patient: [name]  Procedure(s) Performed: ESOPHAGOGASTRODUODENOSCOPY (EGD) WITH PROPOFOL BIOPSY SAVORY DILATION     Patient location during evaluation: PACU Anesthesia Type: MAC Level of consciousness: awake and alert Pain management: pain level controlled Vital Signs Assessment: post-procedure vital signs reviewed and stable Respiratory status: spontaneous breathing, nonlabored ventilation, respiratory function stable and patient connected to nasal cannula oxygen Cardiovascular status: stable and blood pressure returned to baseline Postop Assessment: no apparent nausea or vomiting Anesthetic complications: no  No notable events documented.  Last Vitals:  Vitals:   10/31/22 1125 10/31/22 1130  BP: 112/78 (!) 131/53  Pulse: 71 70  Resp: (!) 22 19  Temp:    SpO2: 98% 100%    Last Pain:  Vitals:   10/31/22 1130  TempSrc:   PainSc: 0-No pain                 Shali Vesey

## 2022-10-31 NOTE — Discharge Instructions (Signed)
YOU HAD AN ENDOSCOPIC PROCEDURE TODAY: Refer to the procedure report and other information in the discharge instructions given to you for any specific questions about what was found during the examination. If this information does not answer your questions, please call [place] office at [phone] to clarify.   YOU SHOULD EXPECT: Some feelings of bloating in the abdomen. Passage of more gas than usual. Walking can help get rid of the air that was put into your GI tract during the procedure and reduce the bloating. If you had a lower endoscopy (such as a colonoscopy or flexible sigmoidoscopy) you may notice spotting of blood in your stool or on the toilet paper. Some abdominal soreness may be present for a day or two, also.  DIET: Your first meal following the procedure should be a light meal and then it is ok to progress to your normal diet. A half-sandwich or bowl of soup is an example of a good first meal. Heavy or fried foods are harder to digest and may make you feel nauseous or bloated. Drink plenty of fluids but you should avoid alcoholic beverages for 24 hours. If you had a esophageal dilation, please see attached instructions for diet.    ACTIVITY: Your care partner should take you home directly after the procedure. You should plan to take it easy, moving slowly for the rest of the day. You can resume normal activity the day after the procedure however YOU SHOULD NOT DRIVE, use power tools, machinery or perform tasks that involve climbing or major physical exertion for 24 hours (because of the sedation medicines used during the test).   SYMPTOMS TO REPORT IMMEDIATELY: A gastroenterologist can be reached at any hour. Please call [phone]  for any of the following symptoms:   Following upper endoscopy (EGD, EUS, ERCP, esophageal dilation) Vomiting of blood or coffee ground material  New, significant abdominal pain  New, significant chest pain or pain under the shoulder blades  Painful or  persistently difficult swallowing  New shortness of breath  Black, tarry-looking or red, bloody stools  FOLLOW UP:  If any biopsies were taken you will be contacted by phone or by letter within the next 1-3 weeks. Call [phone]  if you have not heard about the biopsies in 3 weeks.  Please also call with any specific questions about appointments or follow up tests.  

## 2022-10-31 NOTE — Anesthesia Procedure Notes (Signed)
Procedure Name: MAC Date/Time: 10/31/2022 10:46 AM  Performed by: Deliah Boston, CRNAPre-anesthesia Checklist: Patient identified, Emergency Drugs available, Suction available and Patient being monitored Patient Re-evaluated:Patient Re-evaluated prior to induction Oxygen Delivery Method: Simple face mask Preoxygenation: Pre-oxygenation with 100% oxygen Placement Confirmation: positive ETCO2 and breath sounds checked- equal and bilateral Dental Injury: Teeth and Oropharynx as per pre-operative assessment

## 2022-11-01 ENCOUNTER — Encounter (HOSPITAL_COMMUNITY): Payer: Self-pay | Admitting: Gastroenterology

## 2022-11-01 ENCOUNTER — Encounter (HOSPITAL_COMMUNITY): Payer: Medicare HMO

## 2022-11-01 ENCOUNTER — Encounter: Payer: Self-pay | Admitting: Gastroenterology

## 2022-11-01 LAB — SURGICAL PATHOLOGY

## 2022-11-02 ENCOUNTER — Ambulatory Visit (HOSPITAL_COMMUNITY)
Admission: RE | Admit: 2022-11-02 | Discharge: 2022-11-02 | Disposition: A | Discharge status: Home / Self Care | Payer: Medicare HMO | Source: Ambulatory Visit | Attending: Gastroenterology | Admitting: Gastroenterology

## 2022-11-02 DIAGNOSIS — Z8619 Personal history of other infectious and parasitic diseases: Secondary | ICD-10-CM | POA: Insufficient documentation

## 2022-11-02 DIAGNOSIS — R932 Abnormal findings on diagnostic imaging of liver and biliary tract: Secondary | ICD-10-CM | POA: Diagnosis present

## 2022-11-06 ENCOUNTER — Encounter (HOSPITAL_COMMUNITY): Payer: Medicare HMO

## 2022-11-08 ENCOUNTER — Encounter (HOSPITAL_COMMUNITY): Payer: Medicare HMO

## 2022-11-09 ENCOUNTER — Telehealth: Payer: Self-pay | Admitting: Pharmacy Technician

## 2022-11-09 NOTE — Telephone Encounter (Signed)
Patient Advocate Encounter  Received notification from [facility] that prior authorization for ESOMEPRAZOLE 40MG  is required.   PA submitted on 2.9.24 Key [id number] Status is pending

## 2022-11-09 NOTE — Telephone Encounter (Signed)
Patient Advocate Encounter  Prior Authorization for ESOMEPRAZOLE 40MG  has been approved.    PA# [id number] Effective dates: 1.1.24 through 12.31.24

## 2022-12-10 ENCOUNTER — Other Ambulatory Visit: Payer: Self-pay | Admitting: Adult Health

## 2022-12-10 DIAGNOSIS — F321 Major depressive disorder, single episode, moderate: Secondary | ICD-10-CM

## 2023-01-15 ENCOUNTER — Ambulatory Visit: Payer: Medicare HMO | Admitting: Gastroenterology

## 2023-01-25 ENCOUNTER — Other Ambulatory Visit: Payer: Self-pay | Admitting: Gastroenterology

## 2023-01-30 ENCOUNTER — Encounter: Payer: Self-pay | Admitting: Adult Health

## 2023-01-30 ENCOUNTER — Ambulatory Visit (INDEPENDENT_AMBULATORY_CARE_PROVIDER_SITE_OTHER): Payer: Medicare Other | Admitting: Adult Health

## 2023-01-30 VITALS — BP 100/60 | HR 125 | Temp 97.6°F | Ht 64.0 in | Wt 155.0 lb

## 2023-01-30 DIAGNOSIS — W57XXXA Bitten or stung by nonvenomous insect and other nonvenomous arthropods, initial encounter: Secondary | ICD-10-CM

## 2023-01-30 DIAGNOSIS — S30861A Insect bite (nonvenomous) of abdominal wall, initial encounter: Secondary | ICD-10-CM

## 2023-01-30 MED ORDER — DOXYCYCLINE HYCLATE 100 MG PO CAPS: 100.0000 mg | ORAL_CAPSULE | Freq: Two times a day (BID) | ORAL | 0 refills | Status: DC

## 2023-01-30 NOTE — Progress Notes (Signed)
Subjective:    Patient ID: [name], female    DOB: 1956-06-30, 67 y.o.   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  She presents to the office today for an acute issue of a tick bite. She felt a itch on her abdomen about 2 days ago and noticed a tick that was attached to her abdomen. She was able to remove the tick completely. She has itchiness and redness around the insertion site.   She has not had any fevers/chills,generalized body aches or rash     Review of Systems See HPI   Past Medical History:  Diagnosis Date   Anxiety    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    Hepatitis C    lung ca dx'd 03/2018   Tremor, unspecified    non-specific; takes inderal.     Social History   Socioeconomic History   Marital status: Widowed    Spouse name: Not on file   Number of children: Not on file   Years of education: Not on file   Highest education level: 12th grade  Occupational History   Not on file  Tobacco Use   Smoking status: Former    Packs/day: 1.00    Years: 40.00    Additional pack years: 0.00    Total pack years: 40.00    Types: Cigarettes    Quit date: 12/14/2017    Years since quitting: 5.1    Passive exposure: Never   Smokeless tobacco: Never  Vaping Use   Vaping Use: Never used  Substance and Sexual Activity   Alcohol use: Yes    Alcohol/week: 7.0 standard drinks of alcohol    Types: 7 Shots of liquor per week    Comment: occasional bourbon   Drug use: Not Currently   Sexual activity: Never  Other  Topics Concern   Not on file  Social History Narrative   Not on file   Social Determinants of Health   Financial Resource Strain: Low Risk  (08/15/2022)   Overall Financial Resource Strain (CARDIA)    Difficulty of Paying Living Expenses: Not hard at all  Food Insecurity: No Food Insecurity (08/15/2022)   Hunger Vital Sign    Worried About Running Out of Food in the Last Year: Never true    Ran Out of Food in the Last Year: Never true  Transportation Needs: No Transportation Needs (08/15/2022)   PRAPARE - Administrator, Civil Service (Medical): No    Lack of Transportation (Non-Medical): No  Physical Activity: Insufficiently Active (08/15/2022)   Exercise Vital Sign    Days of Exercise per Week: 1 day    Minutes of Exercise per Session: 10 min  Stress: No Stress Concern Present (08/15/2022)   [employer] of Occupational Health - Occupational Stress Questionnaire    Feeling of Stress : Not at all  Social Connections: Moderately Isolated (08/15/2022)   Social Connection and Isolation Panel [NHANES]    Frequency of Communication with Friends and Family: Twice a week  Frequency of Social Gatherings with Friends and Family: Once a week    Attends Religious Services: 1 to 4 times per year    Active Member of [employer] or Organizations: No    Attends Banker Meetings: Not on file    Marital Status: Widowed  Intimate Partner Violence: Not At Risk (09/22/2021)   Humiliation, Afraid, Rape, and Kick questionnaire    Fear of Current or Ex-Partner: No    Emotionally Abused: No    Physically Abused: No    Sexually Abused: No    Past Surgical History:  Procedure Laterality Date   ABDOMINAL HYSTERECTOMY  1986   menorrhagia; hx of cryo for abnormal paps   APPENDECTOMY     removed in 1986 per pt   BIOPSY  10/31/2022   Procedure: BIOPSY;  Surgeon: Lemar Lofty., MD;  Location: [facility];  Service: Gastroenterology;;   CATARACT EXTRACTION  Bilateral 07/2022   ESOPHAGOGASTRODUODENOSCOPY (EGD) WITH PROPOFOL N/A 10/31/2022   Procedure: ESOPHAGOGASTRODUODENOSCOPY (EGD) WITH PROPOFOL;  Surgeon: Lemar Lofty., MD;  Location: [facility];  Service: Gastroenterology;  Laterality: N/A;   INTERCOSTAL NERVE BLOCK Right 12/18/2019   Procedure: Intercostal Nerve Block;  Surgeon: Loreli Slot, MD;  Location: [facility] OR;  Service: Thoracic;  Laterality: Right;   LOBECTOMY Left 03/13/2018   Procedure: Lingula section of left upper lobe lobectomy;  Surgeon: Loreli Slot, MD;  Location: [facility] OR;  Service: Thoracic;  Laterality: Left;   NODE DISSECTION  12/18/2019   Procedure: Node Dissection;  Surgeon: Loreli Slot, MD;  Location: [facility] OR;  Service: Thoracic;;   SAVORY DILATION N/A 10/31/2022   Procedure: Jacklyn Shell;  Surgeon: Lemar Lofty., MD;  Location: [facility];  Service: Gastroenterology;  Laterality: N/A;   VIDEO ASSISTED THORACOSCOPY (VATS)/WEDGE RESECTION Left 03/13/2018   Procedure: VIDEO ASSISTED THORACOSCOPY (VATS)/WEDGE RESECTION;  Surgeon: Loreli Slot, MD;  Location: [facility] OR;  Service: Thoracic;  Laterality: Left;    Family History  Problem Relation Age of Onset   Asthma Mother    Alzheimer's disease Mother 14   Hypertension Mother    Other Father        shot    Neuropathy Sister        face   Cancer Maternal Aunt        breast   Cancer Paternal Aunt    Lung cancer Paternal Uncle    Cancer Paternal Grandmother        abdominal   Colon cancer Neg Hx    Esophageal cancer Neg Hx    Inflammatory bowel disease Neg Hx    Liver disease Neg Hx    Pancreatic cancer Neg Hx    Rectal cancer Neg Hx    Stomach cancer Neg Hx     Allergies  Allergen Reactions   Aspirin Shortness Of Breath   Trelegy Ellipta [Fluticasone-Umeclidin-Vilant] Shortness Of Breath    She reports that is made her cough worse.    Morphine Sulfate Other (See Comments)    RED STREAKS ON ARMS.    Oxycodone Itching and Rash   Glycopyrrolate Other (See Comments)    UNSPECIFIED REACTION  pt unsure if she is allergic.   Lactose Intolerance (Gi) Nausea And Vomiting    Milk only.Patient states that she can eat cheese.   Penicillins Other (See Comments)     Heartburn, upset stomach only & tolerated AMOXIL after this reaction Has patient had a PCN reaction causing immediate rash, facial/tongue/throat swelling, SOB or lightheadedness  with hypotension: No Has patient had a PCN reaction causing severe rash involving mucus membranes or skin necrosis: No Has patient had a PCN reaction that required hospitalization: No Has patient had a PCN reaction occurring within the last 10 years: #  #  #  YES  #  #  #    Sulfa Antibiotics Tinitus    Current Outpatient Medications on File Prior to Visit  Medication Sig Dispense Refill   albuterol (PROVENTIL) (2.5 MG/3ML) 0.083% nebulizer solution Take 3 mLs (2.5 mg total) by nebulization every 4 (four) hours as needed for wheezing or shortness of breath. 75 mL 12   albuterol (VENTOLIN HFA) 108 (90 Base) MCG/ACT inhaler Inhale 2 puffs into the lungs every 6 (six) hours as needed for wheezing or shortness of breath. 8 g 0   Budeson-Glycopyrrol-Formoterol (BREZTRI AEROSPHERE) 160-9-4.8 MCG/ACT AERO Inhale 2 puffs into the lungs 2 (two) times daily. 10.7 g 11   clonazePAM (KLONOPIN) 0.5 MG tablet TAKE 1 TABLET BY MOUTH THREE TIMES DAILY AS NEEDED FOR ANXIETY 90 tablet 2   esomeprazole (NEXIUM) 40 MG capsule TAKE 1 CAPSULE BY MOUTH TWICE DAILY BEFORE A MEAL 60 capsule 0   fluticasone (FLONASE) 50 MCG/ACT nasal spray Use 1 spray(s) in each nostril once daily 16 g 0   guaiFENesin (MUCINEX) 600 MG 12 hr tablet Take 600 mg by mouth 2 (two) times daily as needed for cough or to loosen phlegm.     PARoxetine (PAXIL) 40 MG tablet Take 1 tablet by mouth once daily 90 tablet 0   propranolol ER (INDERAL LA) 60 MG 24 hr capsule Take 1 capsule (60 mg total) by mouth daily. 90  capsule 3   sucralfate (CARAFATE) 1 g tablet Take 1 tablet (1 g total) by mouth 4 (four) times daily. Please give patient instructions for slurry. 60 tablet 2   traZODone (DESYREL) 100 MG tablet TAKE 1 TABLET BY MOUTH AT BEDTIME AS NEEDED FOR SLEEP 90 tablet 1   No current facility-administered medications on file prior to visit.    BP 100/60   Pulse (!) 125   Temp 97.6 F (36.4 C) (Oral)   Ht 5\' 4"  (1.626 m)   Wt 155 lb (70.3 kg)   SpO2 96%   BMI 26.61 kg/m       Objective:   Physical Exam Abdominal:    Skin:    General: Skin is warm and dry.     Capillary Refill: Capillary refill takes less than 2 seconds.     Findings: Erythema present. No rash.  Neurological:     General: No focal deficit present.     Mental Status: She is oriented to person, place, and time.  Psychiatric:        Mood and Affect: Mood normal.        Behavior: Behavior normal.        Thought Content: Thought content normal.        Judgment: Judgment normal.        Assessment & Plan:   1. Tick bite of abdomen, initial encounter - doxycycline (VIBRAMYCIN) 100 MG capsule; Take 1 capsule (100 mg total) by mouth 2 (two) times daily.  Dispense: 14 capsule; Refill: 0  Shirline Frees, NP

## 2023-02-11 NOTE — Progress Notes (Unsigned)
[name], female    DOB: Jul 20, 1956,    MRN: [id number]   Brief patient profile:  23 yowf  quit smoking 2019 p started albuterol  inhalers around 2014 then added symbicort changed to wixella  due to cost referred to pulmonary clinic 06/29/2021 by Dr Hassan Rowan  who recently changed to breztri which seemed to helped even more.   PFTs 12/14/19 c/w GOLD 3 copd   04/12/21 oncology note DIAGNOSIS:  1) Stage Ib (T2, N0, M0) non-small cell lung cancer, adenocarcinoma diagnosed in June 2019. 2) stage Ia (T1 a, N0, M0) non-small cell lung cancer, adenocarcinoma status post   PRIOR THERAPY:  1) status post wedge resection of the left upper lobe nodule in addition to lingular segmentectomy and mediastinal lymph node sampling under the care of Dr. Dorris Fetch on March 13, 2018.  Right lower lobe segmentectomy under the care of Dr. Dorris Fetch on December 18, 2019.   CURRENT THERAPY: Observation  History of Present Illness  06/29/2021  Pulmonary/ 1st office eval/[name]  Chief Complaint  Patient presents with   Pulmonary Consult     Referred by Dr. Rush Barer. Pt has hx of lung ca and she was told back in 2018 she has COPD. She gets winded just walking around her house. She had PNA approx 2 months.    Dyspnea:  one room to the next / shops on line  Cough: prednisone helped still some residual dry cough  Sleep: on side / electric bed 30 degrees  SABA use: one bid hfa/ neb once daily  Rec Plan A = Automatic = Always=    wixella one twice daily  = trelegy 100 once daily  or Breztri 2 puff every 12 hours  Breathe out thru nose  Plan B = Backup (to supplement plan A, not to replace it) Only use your albuterol inhaler as a rescue medication  Plan C = Crisis (instead of Plan B but only if Plan B stops working) - only use your albuterol nebulizer if you first try Plan B and it fails to help Call we the cheapest alternatives  for example wixellla / incruse   = trelegy       09/07/2021  f/u ov/[name]  re: GOLD 3 COPD   maint on breztri 2bid  / pred 40 taper / mucinex/ no abx Chief Complaint  Patient presents with   Follow-up    Pt states concerns for COPD exacerbation  Rec Plan A = Automatic = Always=    symbicort 160 instead of breztri @ Take 2 puffs first thing in am and then another 2 puffs about 12 hours later.   Plan B = Backup (to supplement plan A, not to replace it) Only use your albuterol (PROAIR) inhaler as a rescue medication Plan C = Crisis (instead of Plan B but only if Plan B stops working) - only use your albuterol/atrovent  nebulizer if you first try Plan B and it fails to help  Plan D = Doctor - call me if B and C not adequate Use the lowest possible dose of Inderal      03/07/2022  f/u ov/[name] re: GOLD 3   maint on breztri and pred 10 mg daily   Chief Complaint  Patient presents with   Follow-up    Pt states she is still having problems with her breathing since last visit. States some inflammation was found in lower left lung on a PET scan.  Rec Work on inhaler technique:   - remember  how golfers warm up  Prednsione for now is 2 until better and then 1 daily   Please schedule a follow up visit in 3 months but call sooner if needed    04/23/2022  f/u ov/[name] re: copd on inderol 60  -  maint on breztri last dose 2 weeks prior and no change  off pred Chief Complaint  Patient presents with   Follow-up    Pt states she has no new issues and she finished the prednisone but no refill.  Rec Try stop propranolol and on  bisoprolol 5 mg one daily (take one half daily if too strong)  Ok to try albuterol 15 min before an activity (on alternating days between inhaler/ nebulizer or nothing )  that you know would usually make you short of breath  Make sure you check your oxygen saturation  AT  your highest level of activity (not after you stop)   to be sure it stays over 90% We may need clonazepam to help reduce your air hunger       08/14/2022  f/u ov/[name] re: gold 3  maint on breztri  / could not tol bisoprolol due to shaking but helped breathing  Chief Complaint  Patient presents with   Follow-up    Breathing has improved slightly. She has not used her albuterol inhaler in the past wk. She has non prod cough.    Dyspnea:  around the house  Cough: sometimes with coughing  Sleeping: 6 in bed blocks on side  SABA use: rarely needing hfa/ much less neb  02 has it, not using,sats usually > 90% walking on RA though very sedentary "they fired" me from rehab Rec Please remember to go to the lab department   for your tests - we will call you with the results when they are available. Work on inhaler technique:  My office will be contacting you by phone for referral to pulmonary rehab> done - says failed though able to bicycle fine     02/12/2023  f/u ov/[name] re: GOLD 3 copd maint on breztri   Chief Complaint  Patient presents with   Follow-up    Cough and SOB with exertion persistent.  Dyspnea:  walking to Bathroom and back is about her limit  Cough: not productive  Sleeping:30 degrees electric bed  SABA use: albuterol before ex made no difference /  02: not using at all   No obvious day to day or daytime variability or assoc excess/ purulent sputum or mucus plugs or hemoptysis or cp or chest tightness, subjective wheeze or overt sinus or hb symptoms.   Sleeping as above without nocturnal  or early am exacerbation  of respiratory  c/o's or need for noct saba. Also denies any obvious fluctuation of symptoms with weather or environmental changes or other aggravating or alleviating factors except as outlined above   No unusual exposure hx or h/o childhood pna/ asthma or knowledge of premature birth.  Current Allergies, Complete Past Medical History, Past Surgical History, Family History, and Social History were reviewed in [employer] record.  ROS  The following are not active complaints unless bolded Hoarseness, sore throat,  dysphagia, dental problems, itching, sneezing,  nasal congestion or discharge of excess mucus or purulent secretions, ear ache,   fever, chills, sweats, unintended wt loss or wt gain, classically pleuritic or exertional cp,  orthopnea pnd or arm/hand swelling  or leg swelling, presyncope, palpitations, abdominal pain, anorexia, nausea, vomiting, diarrhea  or change  in bowel habits or change in bladder habits, change in stools or change in urine, dysuria, hematuria,  rash, arthralgias, visual complaints, headache, numbness, weakness or ataxia or problems with walking or coordination,  change in mood or  memory, tremor        Current Meds  Medication Sig   albuterol (PROVENTIL) (2.5 MG/3ML) 0.083% nebulizer solution Take 3 mLs (2.5 mg total) by nebulization every 4 (four) hours as needed for wheezing or shortness of breath.   albuterol (VENTOLIN HFA) 108 (90 Base) MCG/ACT inhaler Inhale 2 puffs into the lungs every 6 (six) hours as needed for wheezing or shortness of breath.   Budeson-Glycopyrrol-Formoterol (BREZTRI AEROSPHERE) 160-9-4.8 MCG/ACT AERO Inhale 2 puffs into the lungs 2 (two) times daily.   clonazePAM (KLONOPIN) 0.5 MG tablet TAKE 1 TABLET BY MOUTH THREE TIMES DAILY AS NEEDED FOR ANXIETY   esomeprazole (NEXIUM) 40 MG capsule TAKE 1 CAPSULE BY MOUTH TWICE DAILY BEFORE A MEAL   fluticasone (FLONASE) 50 MCG/ACT nasal spray Use 1 spray(s) in each nostril once daily   guaiFENesin (MUCINEX) 600 MG 12 hr tablet Take 600 mg by mouth 2 (two) times daily as needed for cough or to loosen phlegm.   PARoxetine (PAXIL) 40 MG tablet Take 1 tablet by mouth once daily   propranolol ER (INDERAL LA) 60 MG 24 hr capsule Take 1 capsule (60 mg total) by mouth daily.   sucralfate (CARAFATE) 1 g tablet Take 1 tablet (1 g total) by mouth 4 (four) times daily. Please give patient instructions for slurry.   traZODone (DESYREL) 100 MG tablet TAKE 1 TABLET BY MOUTH AT BEDTIME AS NEEDED FOR SLEEP              Past  Medical History:  Diagnosis Date   Anxiety    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    Hepatitis C    lung ca dx'd 03/2018   Tremor, unspecified    non-specific; takes inderal.          Objective:     Wts  02/12/2023       156  08/14/2022      150  04/23/2022       156   03/07/2022        156   09/07/21 125 lb (56.7 kg)  06/29/21 120 lb (54.4 kg)  06/07/21 119 lb 11.2 oz (54.3 kg)   Vital signs reviewed  02/12/2023  - Note at rest 02 sats  93% on RA    General appearance:    slt tremulous amb wf nad    HEENT : Oropharynx  clear      NECK :  without  apparent JVD/ palpable Nodes/TM    LUNGS: no acc muscle use,  Mild barrel  contour chest wall with bilateral  Distant bs s audible wheeze and  without cough on insp or exp maneuvers  and mild  Hyperresonant  to  percussion bilaterally     CV:  RRR  no s3 or murmur or increase in P2, and no edema   ABD:  soft and nontender    MS:  Nl gait/ ext warm without deformities Or obvious joint restrictions  calf tenderness, cyanosis or clubbing  SKIN: warm and dry without lesions    NEURO:  alert, approp, nl sensorium with  no motor or cerebellar deficits apparent.        I personally reviewed images and agree with radiology impression as follows:  CXR:   pa and lateral 10/05/22  Emphysema, without acute cardiopulmonary disease            Assessment

## 2023-02-12 ENCOUNTER — Ambulatory Visit (INDEPENDENT_AMBULATORY_CARE_PROVIDER_SITE_OTHER): Payer: Medicare Other | Admitting: Internal Medicine

## 2023-02-12 ENCOUNTER — Encounter: Payer: Self-pay | Admitting: Internal Medicine

## 2023-02-12 VITALS — BP 138/78 | HR 90 | Temp 97.7°F | Ht 64.0 in | Wt 156.6 lb

## 2023-02-12 DIAGNOSIS — J449 Chronic obstructive pulmonary disease, unspecified: Secondary | ICD-10-CM | POA: Diagnosis not present

## 2023-02-12 DIAGNOSIS — R251 Tremor, unspecified: Secondary | ICD-10-CM

## 2023-02-12 NOTE — Patient Instructions (Addendum)
Make sure you check your oxygen saturation  AT  your highest level of activity (not after you stop)   to be sure it stays over 90% and adjust  02 flow upward to maintain this level if needed but remember to turn it back to previous settings when you stop (to conserve your supply).   Pace yourself aiming for 30 min of sustained exercise at a level where you are short of breath but not out of breath.  Ask your neurologist about weaning off Inderal  Work on inhaler technique:  relax and gently blow all the way out then take a nice smooth full deep breath back in, triggering the inhaler at same time you start breathing in.  Hold breath in for at least  5 seconds if you can. Blow out breztri thru nose. Rinse and gargle with water when done.  If mouth or throat bother you at all,  try brushing teeth/gums/tongue with arm and hammer toothpaste/ make a slurry and gargle and spit out.    Please schedule a follow up visit in 6 months but call sooner if needed

## 2023-02-13 NOTE — Assessment & Plan Note (Signed)
In the setting of refractory respiratory symptoms,   It would be preferable to use bystolic, the most beta -1  selective Beta blocker available in sample form, with bisoprolol the most selective generic choice  on the market, at least on a trial basis, to make sure the spillover Beta 2 effects of the less specific Beta blockers are not contributing to this patient's symptoms.  Breathed better on bisoprolol but tremor was worse > consider alternative per neuro          Each maintenance medication was reviewed in detail including emphasizing most importantly the difference between maintenance and prns and under what circumstances the prns are to be triggered using an action plan format where appropriate.  Total time for H and P, chart review, counseling, and generating customized AVS unique to this office visit / same day charting = 20 min

## 2023-02-13 NOTE — Assessment & Plan Note (Addendum)
Quit smoking 2019/MM - PFT's  12/14/19 FEV1 1.02 (40 % ) ratio 0.39  p 20 % improvement from saba p ? prior to study with DLCO  10.06 (48%) corrects to 2.32 (55%)  for alv volume and FV curve classic concave exp loop - 06/29/2021  After extensive coaching inhaler device,  effectiveness =    90% with dpi, 75% with hfa   - 06/29/2021   Walked on RA x  3  lap(s) =  approx 750 ft @ mod fast pace, stopped due to end of study, sob  with lowest 02 sats 93% - 04/23/2022 rec change propranolol to bisoprolol  - Labs ordered 08/14/2022  :    alpha one AT phenotype  MM  149  - 02/12/2023  After extensive coaching inhaler device,  effectiveness =    75% (short ti) > continue breztri    Group D (now reclassified as E) in terms of symptom/risk and laba/lama/ICS  therefore appropriate rx at this point >>>  continue breztri and approp saba   Also advised:  Make sure you check your oxygen saturation  AT  your highest level of activity (not after you stop)   to be sure it stays over 90% and adjust  02 flow upward to maintain this level if needed but remember to turn it back to previous settings when you stop (to conserve your supply).   Pace yourself aiming for 30 min of sustained exercise at a level where you are short of breath but not out of breath.  Ask your neurologist about weaning off Inderal  Work on inhaler technique

## 2023-02-19 ENCOUNTER — Encounter: Payer: Self-pay | Admitting: Internal Medicine

## 2023-03-08 ENCOUNTER — Other Ambulatory Visit: Payer: Self-pay | Admitting: Gastroenterology

## 2023-03-08 ENCOUNTER — Other Ambulatory Visit: Payer: Self-pay | Admitting: Adult Health

## 2023-03-08 DIAGNOSIS — F321 Major depressive disorder, single episode, moderate: Secondary | ICD-10-CM

## 2023-03-08 DIAGNOSIS — F419 Anxiety disorder, unspecified: Secondary | ICD-10-CM

## 2023-03-12 ENCOUNTER — Other Ambulatory Visit: Payer: Self-pay | Admitting: Adult Health

## 2023-03-12 MED ORDER — GENTAMICIN SULFATE 0.3 % OP SOLN: 1.0000 [drp] | OPHTHALMIC | 0 refills | Status: AC

## 2023-04-08 ENCOUNTER — Telehealth: Payer: Self-pay | Admitting: Internal Medicine

## 2023-04-09 MED ORDER — BREZTRI AEROSPHERE 160-9-4.8 MCG/ACT IN AERO: 2.0000 | INHALATION_SPRAY | Freq: Two times a day (BID) | RESPIRATORY_TRACT | 4 refills | Status: DC

## 2023-04-09 NOTE — Telephone Encounter (Signed)
Called and spoke with patient, she stated that she needs a new prescription sent to [facility], it has been a year.  Advised that Dr. Sherene Sires is out of the office currently, but will return tomorrow and we will take care of it for her.  Script printed, signed by Ames Dura NP.  Faxed to [facility], received confirmation that it was faxed successfully.  Nothing further needed.

## 2023-04-09 NOTE — Telephone Encounter (Signed)
PT calling again for this refill. She is terrified she will run out because it is the only way she can breath. Please call PT to advise action taken. [phone]

## 2023-04-24 ENCOUNTER — Encounter: Payer: Self-pay | Admitting: Pulmonary Disease

## 2023-04-24 ENCOUNTER — Ambulatory Visit: Payer: Medicare HMO | Admitting: Pulmonary Disease

## 2023-04-24 VITALS — BP 134/80 | HR 94 | Temp 98.0°F | Ht 64.0 in | Wt 158.4 lb

## 2023-04-24 DIAGNOSIS — J45901 Unspecified asthma with (acute) exacerbation: Secondary | ICD-10-CM

## 2023-04-24 DIAGNOSIS — J441 Chronic obstructive pulmonary disease with (acute) exacerbation: Secondary | ICD-10-CM

## 2023-04-24 MED ORDER — PREDNISONE 10 MG PO TABS: ORAL_TABLET | ORAL | 0 refills | Status: AC

## 2023-04-24 MED ORDER — DOXYCYCLINE HYCLATE 100 MG PO TABS: 100.0000 mg | ORAL_TABLET | Freq: Two times a day (BID) | ORAL | 0 refills | Status: DC

## 2023-04-24 NOTE — Patient Instructions (Addendum)
Use breztri inhaler 2 puffs twice daily - rinse mouth out after each use  Continue to use albuterol inhaler 1-2 puffs every 4-6 hours or nebulizer treatment every 4-6 hours  Start prednisone taper 40mg  x 3 days 30mg  x 3 days 20mg  x 3 days 10mg  x 3 days  Start doxycycline 1 tablet twice daily for 10 days  Follow up with Dr. Sherene Sires as schedule, call sooner if needed

## 2023-04-24 NOTE — Progress Notes (Signed)
Synopsis: Patient of Dr. Sherene Sires, acute visit for COPD  Subjective:   PATIENT ID: [name] GENDER: female DOB: [birth year], MRN: [id number]  HPI  Chief Complaint  Patient presents with   Acute Visit    Increased SOB for the past 10 days- SOB with or without any exertion.  She has also had increased cough- prod with light green, thick sputum.     [name] is a 67 year old woman, former smoker with history of NSCLC s/p lingular segmentectomy and RLL segmentectomy and COPD who returns to pulmonary clinic for acute visit.   She is on breztri inhaler 2 puffs twice daily.   1 week ago increase in cough and mucous production along with dyspnea, and wheezing. She is coughing up thick greenish phlegm.   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    Hepatitis C    lung ca dx'd 03/2018   Tremor, unspecified    non-specific; takes inderal.      Family History  Problem Relation Age of Onset   Asthma Mother    Alzheimer's disease Mother 14   Hypertension Mother    Other Father        shot    Neuropathy Sister        face   Cancer Maternal Aunt        breast   Cancer Paternal Aunt    Lung cancer Paternal Uncle    Cancer Paternal Grandmother        abdominal   Colon cancer Neg Hx    Esophageal cancer Neg Hx    Inflammatory bowel disease Neg Hx    Liver disease Neg Hx    Pancreatic cancer Neg Hx    Rectal cancer Neg Hx    Stomach cancer Neg Hx      Social History   Socioeconomic History   Marital status: Widowed    Spouse name: Not on file   Number of children: Not on file   Years of education: Not on file   Highest education level: 12th grade  Occupational History   Not on file  Tobacco Use   Smoking  status: Former    Current packs/day: 0.00    Average packs/day: 1 pack/day for 40.0 years (40.0 ttl pk-yrs)    Types: Cigarettes    Start date: 12/14/1977    Quit date: 12/14/2017    Years since quitting: 5.3    Passive exposure: Never   Smokeless tobacco: Never  Vaping Use   Vaping status: Never Used  Substance and Sexual Activity   Alcohol use: Yes    Alcohol/week: 7.0 standard drinks of alcohol    Types: 7 Shots of liquor per week    Comment: occasional bourbon   Drug use: Not Currently   Sexual activity: Never  Other Topics Concern   Not on file  Social History Narrative   Not on file   Social Determinants of Health   Financial Resource Strain: Low Risk  (08/15/2022)   Overall Financial Resource Strain (CARDIA)    Difficulty of Paying Living Expenses: Not hard at all  Food Insecurity: No Food Insecurity (08/15/2022)   Hunger Vital Sign  Worried About Programme researcher, broadcasting/film/video in the Last Year: Never true    Ran Out of Food in the Last Year: Never true  Transportation Needs: No Transportation Needs (08/15/2022)   PRAPARE - Administrator, Civil Service (Medical): No    Lack of Transportation (Non-Medical): No  Physical Activity: Insufficiently Active (08/15/2022)   Exercise Vital Sign    Days of Exercise per Week: 1 day    Minutes of Exercise per Session: 10 min  Stress: No Stress Concern Present (08/15/2022)   [employer] of Occupational Health - Occupational Stress Questionnaire    Feeling of Stress : Not at all  Social Connections: Moderately Isolated (08/15/2022)   Social Connection and Isolation Panel [NHANES]    Frequency of Communication with Friends and Family: Twice a week    Frequency of Social Gatherings with Friends and Family: Once a week    Attends Religious Services: 1 to 4 times per year    Active Member of [employer] or Organizations: No    Attends Banker Meetings: Not on file    Marital Status: Widowed  Intimate Partner  Violence: Not At Risk (09/22/2021)   Humiliation, Afraid, Rape, and Kick questionnaire    Fear of Current or Ex-Partner: No    Emotionally Abused: No    Physically Abused: No    Sexually Abused: No     Allergies  Allergen Reactions   Aspirin Shortness Of Breath   Trelegy Ellipta [Fluticasone-Umeclidin-Vilant] Shortness Of Breath    She reports that is made her cough worse.    Morphine Sulfate Other (See Comments)    RED STREAKS ON ARMS.   Oxycodone Itching and Rash   Glycopyrrolate Other (See Comments)    UNSPECIFIED REACTION  pt unsure if she is allergic.   Lactose Intolerance (Gi) Nausea And Vomiting    Milk only.Patient states that she can eat cheese.   Penicillins Other (See Comments)     Heartburn, upset stomach only & tolerated AMOXIL after this reaction Has patient had a PCN reaction causing immediate rash, facial/tongue/throat swelling, SOB or lightheadedness with hypotension: No Has patient had a PCN reaction causing severe rash involving mucus membranes or skin necrosis: No Has patient had a PCN reaction that required hospitalization: No Has patient had a PCN reaction occurring within the last 10 years: #  #  #  YES  #  #  #    Sulfa Antibiotics Tinitus     Outpatient Medications Prior to Visit  Medication Sig Dispense Refill   albuterol (PROVENTIL) (2.5 MG/3ML) 0.083% nebulizer solution Take 3 mLs (2.5 mg total) by nebulization every 4 (four) hours as needed for wheezing or shortness of breath. 75 mL 12   albuterol (VENTOLIN HFA) 108 (90 Base) MCG/ACT inhaler Inhale 2 puffs into the lungs every 6 (six) hours as needed for wheezing or shortness of breath. 8 g 0   Budeson-Glycopyrrol-Formoterol (BREZTRI AEROSPHERE) 160-9-4.8 MCG/ACT AERO Inhale 2 puffs into the lungs 2 (two) times daily. 3 each 4   clonazePAM (KLONOPIN) 0.5 MG tablet TAKE 1 TABLET BY MOUTH THREE TIMES DAILY AS NEEDED FOR ANXIETY 90 tablet 2   esomeprazole (NEXIUM) 40 MG capsule TAKE 1 CAPSULE BY MOUTH  TWICE DAILY BEFORE A MEAL 60 capsule 2   fluticasone (FLONASE) 50 MCG/ACT nasal spray Use 1 spray(s) in each nostril once daily 16 g 0   guaiFENesin (MUCINEX) 600 MG 12 hr tablet Take 600 mg by mouth 2 (two) times daily as  needed for cough or to loosen phlegm.     PARoxetine (PAXIL) 40 MG tablet Take 1 tablet by mouth once daily 90 tablet 0   propranolol ER (INDERAL LA) 60 MG 24 hr capsule Take 1 capsule (60 mg total) by mouth daily. 90 capsule 3   sucralfate (CARAFATE) 1 g tablet Take 1 tablet (1 g total) by mouth 4 (four) times daily. Please give patient instructions for slurry. 60 tablet 2   traZODone (DESYREL) 100 MG tablet TAKE 1 TABLET BY MOUTH AT BEDTIME AS NEEDED FOR SLEEP 90 tablet 0   No facility-administered medications prior to visit.    Review of Systems  Respiratory:  Positive for cough, sputum production, shortness of breath and wheezing.       Objective:   Vitals:   04/24/23 1518  BP: 134/80  Pulse: 94  Temp: 98 F (36.7 C)  TempSrc: Oral  SpO2: 93%  Weight: 158 lb 6.4 oz (71.8 kg)  Height: 5\' 4"  (1.626 m)     Physical Exam Constitutional:      General: She is not in acute distress.    Appearance: Normal appearance.  Eyes:     General: No scleral icterus.    Conjunctiva/sclera: Conjunctivae normal.  Cardiovascular:     Rate and Rhythm: Normal rate and regular rhythm.  Pulmonary:     Breath sounds: Wheezing present. No rhonchi or rales.  Musculoskeletal:     Right lower leg: No edema.     Left lower leg: No edema.  Skin:    General: Skin is warm and dry.  Neurological:     General: No focal deficit present.       CBC    Component Value Date/Time   WBC 10.1 10/05/2022 1243   RBC 5.27 (H) 10/05/2022 1243   HGB 14.4 10/05/2022 1243   HGB 15.0 07/24/2022 1030   HCT 43.2 10/05/2022 1243   PLT 333.0 10/05/2022 1243   PLT 281 07/24/2022 1030   MCV 81.9 10/05/2022 1243   MCH 27.7 07/24/2022 1030   MCHC 33.4 10/05/2022 1243   RDW 15.3  10/05/2022 1243   LYMPHSABS 1.3 07/24/2022 1030   MONOABS 0.5 07/24/2022 1030   EOSABS 0.3 07/24/2022 1030   BASOSABS 0.1 07/24/2022 1030      Latest Ref Rng & Units 10/05/2022   12:43 PM 07/24/2022   10:30 AM 04/06/2022    9:54 AM  BMP  Glucose 70 - 99 mg/dL 914  99  782   BUN 6 - 23 mg/dL 11  10  13    Creatinine 0.40 - 1.20 mg/dL 9.56  2.13  0.86   Sodium 135 - 145 mEq/L 137  139  138   Potassium 3.5 - 5.1 mEq/L 3.7  4.3  4.0   Chloride 96 - 112 mEq/L 100  105  102   CO2 19 - 32 mEq/L 26  26  27    Calcium 8.4 - 10.5 mg/dL 9.6  9.7  9.8    Chest imaging: CXR 10/05/22 Emphysema, without acute cardiopulmonary disease   PFT:    Latest Ref Rng & Units 12/14/2019    9:20 AM 03/11/2018    7:47 AM  PFT Results  FVC-Pre L 2.01  1.86   FVC-Predicted Pre % 60  55   FVC-Post L 2.63  2.47   FVC-Predicted Post % 79  73   Pre FEV1/FVC % % 42  52   Post FEV1/FCV % % 39  52   FEV1-Pre L 0.85  0.97   FEV1-Predicted Pre % 33  37   FEV1-Post L 1.02  1.28   DLCO uncorrected ml/min/mmHg 10.06  10.99   DLCO UNC% % 48  42   DLCO corrected ml/min/mmHg 9.24  10.54   DLCO COR %Predicted % 44  41   DLVA Predicted % 55  54   TLC L 5.78  5.37   TLC % Predicted % 111  103   RV % Predicted % 168  144     Labs:  Path:  Echo:  Heart Catheterization:       Assessment & Plan:   Acute exacerbation of COPD with asthma (HCC) - Plan: predniSONE (DELTASONE) 10 MG tablet, doxycycline (VIBRA-TABS) 100 MG tablet  Discussion: [name] is a 67 year old woman, former smoker with history of NSCLC s/p lingular segmentectomy and RLL segmentectomy and COPD who returns to pulmonary clinic for acute visit.   She has acute exacerbation of her COPD in which we will treat with prolonged steroid taper and course of doxycycline.  Follow-up with Dr. Elesa Massed as scheduled or call sooner if needed.  Melody Comas, MD [facility] Office: [phone]   Current Outpatient  Medications:    albuterol (PROVENTIL) (2.5 MG/3ML) 0.083% nebulizer solution, Take 3 mLs (2.5 mg total) by nebulization every 4 (four) hours as needed for wheezing or shortness of breath., Disp: 75 mL, Rfl: 12   albuterol (VENTOLIN HFA) 108 (90 Base) MCG/ACT inhaler, Inhale 2 puffs into the lungs every 6 (six) hours as needed for wheezing or shortness of breath., Disp: 8 g, Rfl: 0   Budeson-Glycopyrrol-Formoterol (BREZTRI AEROSPHERE) 160-9-4.8 MCG/ACT AERO, Inhale 2 puffs into the lungs 2 (two) times daily., Disp: 3 each, Rfl: 4   clonazePAM (KLONOPIN) 0.5 MG tablet, TAKE 1 TABLET BY MOUTH THREE TIMES DAILY AS NEEDED FOR ANXIETY, Disp: 90 tablet, Rfl: 2   doxycycline (VIBRA-TABS) 100 MG tablet, Take 1 tablet (100 mg total) by mouth 2 (two) times daily., Disp: 20 tablet, Rfl: 0   esomeprazole (NEXIUM) 40 MG capsule, TAKE 1 CAPSULE BY MOUTH TWICE DAILY BEFORE A MEAL, Disp: 60 capsule, Rfl: 2   fluticasone (FLONASE) 50 MCG/ACT nasal spray, Use 1 spray(s) in each nostril once daily, Disp: 16 g, Rfl: 0   guaiFENesin (MUCINEX) 600 MG 12 hr tablet, Take 600 mg by mouth 2 (two) times daily as needed for cough or to loosen phlegm., Disp: , Rfl:    PARoxetine (PAXIL) 40 MG tablet, Take 1 tablet by mouth once daily, Disp: 90 tablet, Rfl: 0   predniSONE (DELTASONE) 10 MG tablet, Take 4 tablets (40 mg total) by mouth daily with breakfast for 3 days, THEN 3 tablets (30 mg total) daily with breakfast for 3 days, THEN 2 tablets (20 mg total) daily with breakfast for 3 days, THEN 1 tablet (10 mg total) daily with breakfast for 3 days., Disp: 30 tablet, Rfl: 0   propranolol ER (INDERAL LA) 60 MG 24 hr capsule, Take 1 capsule (60 mg total) by mouth daily., Disp: 90 capsule, Rfl: 3   sucralfate (CARAFATE) 1 g tablet, Take 1 tablet (1 g total) by mouth 4 (four) times daily. Please give patient instructions for slurry., Disp: 60 tablet, Rfl: 2   traZODone (DESYREL) 100 MG tablet, TAKE 1 TABLET BY MOUTH AT BEDTIME AS NEEDED  FOR SLEEP, Disp: 90 tablet, Rfl: 0

## 2023-05-05 ENCOUNTER — Encounter: Payer: Self-pay | Admitting: Pulmonary Disease

## 2023-05-07 ENCOUNTER — Encounter: Payer: Self-pay | Admitting: Gastroenterology

## 2023-05-07 ENCOUNTER — Telehealth: Payer: Self-pay | Admitting: Gastroenterology

## 2023-05-07 ENCOUNTER — Telehealth (INDEPENDENT_AMBULATORY_CARE_PROVIDER_SITE_OTHER): Payer: Medicare HMO | Admitting: Gastroenterology

## 2023-05-07 VITALS — Ht 64.0 in | Wt 158.0 lb

## 2023-05-07 DIAGNOSIS — K219 Gastro-esophageal reflux disease without esophagitis: Secondary | ICD-10-CM

## 2023-05-07 DIAGNOSIS — K449 Diaphragmatic hernia without obstruction or gangrene: Secondary | ICD-10-CM

## 2023-05-07 DIAGNOSIS — R1013 Epigastric pain: Secondary | ICD-10-CM

## 2023-05-07 DIAGNOSIS — Z1211 Encounter for screening for malignant neoplasm of colon: Secondary | ICD-10-CM

## 2023-05-07 DIAGNOSIS — R1310 Dysphagia, unspecified: Secondary | ICD-10-CM

## 2023-05-07 MED ORDER — ESOMEPRAZOLE MAGNESIUM 40 MG PO CPDR: 40.0000 mg | DELAYED_RELEASE_CAPSULE | Freq: Every day | ORAL | 11 refills | Status: DC

## 2023-05-07 NOTE — Patient Instructions (Addendum)
We have sent the following medications to your pharmacy for you to pick up at your convenience: Nexium   Restart Carafate 1-2 times daily as needed.   You have been scheduled for an endoscopy. Please follow written instructions given to you at your visit today.  If you use inhalers (even only as needed), please bring them with you on the day of your procedure.  If you take any of the following medications, they will need to be adjusted prior to your procedure:   DO NOT TAKE 7 DAYS PRIOR TO TEST- Trulicity (dulaglutide) Ozempic, Wegovy (semaglutide) Mounjaro (tirzepatide) Bydureon Bcise (exanatide extended release)  DO NOT TAKE 1 DAY PRIOR TO YOUR TEST Rybelsus (semaglutide) Adlyxin (lixisenatide) Victoza (liraglutide) Byetta (exanatide) ___________________________________________________________________________     _______________________________________________________  If your blood pressure at your visit was 140/90 or greater, please contact your primary care physician to follow up on this.  _______________________________________________________  If you are age 68 or older, your body mass index should be between 23-30. Your Body mass index is 27.12 kg/m. If this is out of the aforementioned range listed, please consider follow up with your Primary Care Provider.  If you are age 33 or younger, your body mass index should be between 19-25. Your Body mass index is 27.12 kg/m. If this is out of the aformentioned range listed, please consider follow up with your Primary Care Provider.   ________________________________________________________  [facility] providers would like to encourage you to use [facility] to communicate with providers for non-urgent requests or questions.  Due to long hold times on the telephone, sending your provider a message by [facility] may be a faster and more efficient way to get a response.  Please allow 48 business hours for a response.  Please  remember that this is for non-urgent requests.  _______________________________________________________  Thank you for choosing me and [facility].  Dr. Meridee Score

## 2023-05-07 NOTE — Telephone Encounter (Signed)
PT is scheduled for an appointment with Dr. Meridee Score today at 1130am. She's sick and wants to know can it be a phone call. Please advise.

## 2023-05-07 NOTE — Progress Notes (Signed)
GASTROENTEROLOGY OUTPATIENT CLINIC VISIT   Primary Care Provider Shirline Frees, NP [address] [place] [address] [phone]  Patient Profile: [name] is a 67 y.o. female with a pmh significant for COPD, lung cancer (status post resection), MDD, anxiety, status post hysterectomy, status post appendectomy, previously treated HCV (previous 2020 ultrasound imaging suggested possible cirrhosis), hiatal hernia, GERD, Schatzki ring.  The patient presents to the [facility] for an evaluation and management of problem(s) noted below:  Problem List 1. Hiatal hernia   2. Gastroesophageal reflux disease without esophagitis   3. Pill dysphagia   4. Epigastric pain   5. Colon cancer screening    I connected with  Waylan Boga on 05/07/23. I verified that I was speaking with the correct person using two identifiers. This service was provided via telemedicine using audio/visual media. The patient was located at home. The provider was located in the office. The patient did consent to this visit and is aware of charges through their insurance as well as the limitations of evaluation and management by telemedicine. Other persons participating in this telemedicine service were none. Time spent on visit was 25 minutes.   History of Present Illness Please see prior GI notes for full details of HPI.  Interval History The patient is seen in follow-up today.  She was supposed to have an in person visit but due to recent COPD exacerbation we have transition this to a virtual visit.  I have not seen her since her endoscopy performed earlier this year.  She states that as long as she is taking her PPI at twice daily dosing she does feel significant improvement in her pyrosis symptoms.  The pill dysphagia symptoms did not improve after dilation significantly.  She is otherwise feeling okay at this time though she wonders if the abdominal discomfort she has in her  epigastrium could be related to her hiatal hernia.  She has her Cologuard testing at home but has not completed that as of yet.  She still battles constipation at times.  GI Review of Systems Positive as above Negative for melena, hematochezia  Review of Systems General: Denies fevers/chills/weight loss unintentionally Cardiovascular: Denies current chest pain/palpitations Pulmonary: Shortness of breath improving after recent COPD exacerbation Gastroenterological: See HPI Genitourinary: Denies darkened urine Hematological: Denies easy bruising/bleeding Dermatological: Denies jaundice Psychological: Mood is stable   Medications Current Outpatient Medications  Medication Sig Dispense Refill   albuterol (PROVENTIL) (2.5 MG/3ML) 0.083% nebulizer solution Take 3 mLs (2.5 mg total) by nebulization every 4 (four) hours as needed for wheezing or shortness of breath. 75 mL 12   albuterol (VENTOLIN HFA) 108 (90 Base) MCG/ACT inhaler Inhale 2 puffs into the lungs every 6 (six) hours as needed for wheezing or shortness of breath. 8 g 0   Budeson-Glycopyrrol-Formoterol (BREZTRI AEROSPHERE) 160-9-4.8 MCG/ACT AERO Inhale 2 puffs into the lungs 2 (two) times daily. 3 each 4   clonazePAM (KLONOPIN) 0.5 MG tablet TAKE 1 TABLET BY MOUTH THREE TIMES DAILY AS NEEDED FOR ANXIETY 90 tablet 2   doxycycline (VIBRA-TABS) 100 MG tablet Take 1 tablet (100 mg total) by mouth 2 (two) times daily. 20 tablet 0   esomeprazole (NEXIUM) 40 MG capsule Take 1 capsule (40 mg total) by mouth daily. 60 capsule 11   fluticasone (FLONASE) 50 MCG/ACT nasal spray Use 1 spray(s) in each nostril once daily 16 g 0   guaiFENesin (MUCINEX) 600 MG 12 hr tablet Take 600 mg by mouth 2 (two) times daily  as needed for cough or to loosen phlegm.     PARoxetine (PAXIL) 40 MG tablet Take 1 tablet by mouth once daily 90 tablet 0   propranolol ER (INDERAL LA) 60 MG 24 hr capsule Take 1 capsule (60 mg total) by mouth daily. 90 capsule 3    sucralfate (CARAFATE) 1 g tablet Take 1 tablet (1 g total) by mouth 4 (four) times daily. Please give patient instructions for slurry. 60 tablet 2   traZODone (DESYREL) 100 MG tablet TAKE 1 TABLET BY MOUTH AT BEDTIME AS NEEDED FOR SLEEP 90 tablet 0   No current facility-administered medications for this visit.    Allergies Allergies  Allergen Reactions   Aspirin Shortness Of Breath   Trelegy Ellipta [Fluticasone-Umeclidin-Vilant] Shortness Of Breath    She reports that is made her cough worse.    Morphine Sulfate Other (See Comments)    RED STREAKS ON ARMS.   Oxycodone Itching and Rash   Glycopyrrolate Other (See Comments)    UNSPECIFIED REACTION  pt unsure if she is allergic.   Lactose Intolerance (Gi) Nausea And Vomiting    Milk only.Patient states that she can eat cheese.   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    Hepatitis C    lung ca dx'd 03/2018   Tremor, unspecified    non-specific; takes inderal.    Past Surgical History:  Procedure Laterality Date   ABDOMINAL HYSTERECTOMY  1986   menorrhagia; hx of cryo for abnormal paps   APPENDECTOMY     removed in 1986 per pt   BIOPSY  10/31/2022    Procedure: BIOPSY;  Surgeon: Lemar Lofty., MD;  Location: [facility];  Service: Gastroenterology;;   CATARACT EXTRACTION Bilateral 07/2022   ESOPHAGOGASTRODUODENOSCOPY (EGD) WITH PROPOFOL N/A 10/31/2022   Procedure: ESOPHAGOGASTRODUODENOSCOPY (EGD) WITH PROPOFOL;  Surgeon: Lemar Lofty., MD;  Location: [facility];  Service: Gastroenterology;  Laterality: N/A;   INTERCOSTAL NERVE BLOCK Right 12/18/2019   Procedure: Intercostal Nerve Block;  Surgeon: Loreli Slot, MD;  Location: [facility] OR;  Service: Thoracic;  Laterality: Right;   LOBECTOMY Left 03/13/2018   Procedure: Lingula section of left upper lobe lobectomy;  Surgeon: Loreli Slot, MD;  Location: [facility] OR;  Service: Thoracic;  Laterality: Left;   NODE DISSECTION  12/18/2019   Procedure: Node Dissection;  Surgeon: Loreli Slot, MD;  Location: [facility] OR;  Service: Thoracic;;   SAVORY DILATION N/A 10/31/2022   Procedure: Jacklyn Shell;  Surgeon: Lemar Lofty., MD;  Location: [facility];  Service: Gastroenterology;  Laterality: N/A;   VIDEO ASSISTED THORACOSCOPY (VATS)/WEDGE RESECTION Left 03/13/2018  Procedure: VIDEO ASSISTED THORACOSCOPY (VATS)/WEDGE RESECTION;  Surgeon: Loreli Slot, MD;  Location: [facility] OR;  Service: Thoracic;  Laterality: Left;   Social History   Socioeconomic History   Marital status: Widowed    Spouse name: Not on file   Number of children: Not on file   Years of education: Not on file   Highest education level: 12th grade  Occupational History   Not on file  Tobacco Use   Smoking status: Former    Current packs/day: 0.00    Average packs/day: 1 pack/day for 40.0 years (40.0 ttl pk-yrs)    Types: Cigarettes    Start date: 12/14/1977    Quit date: 12/14/2017    Years since quitting: 5.4    Passive exposure: Never   Smokeless tobacco: Never  Vaping Use   Vaping status: Never Used  Substance and Sexual Activity   Alcohol use: Yes    Alcohol/week: 7.0  standard drinks of alcohol    Types: 7 Shots of liquor per week    Comment: occasional bourbon   Drug use: Not Currently   Sexual activity: Never  Other Topics Concern   Not on file  Social History Narrative   Not on file   Social Determinants of Health   Financial Resource Strain: Low Risk  (08/15/2022)   Overall Financial Resource Strain (CARDIA)    Difficulty of Paying Living Expenses: Not hard at all  Food Insecurity: No Food Insecurity (08/15/2022)   Hunger Vital Sign    Worried About Running Out of Food in the Last Year: Never true    Ran Out of Food in the Last Year: Never true  Transportation Needs: No Transportation Needs (08/15/2022)   PRAPARE - Administrator, Civil Service (Medical): No    Lack of Transportation (Non-Medical): No  Physical Activity: Insufficiently Active (08/15/2022)   Exercise Vital Sign    Days of Exercise per Week: 1 day    Minutes of Exercise per Session: 10 min  Stress: No Stress Concern Present (08/15/2022)   [employer] of Occupational Health - Occupational Stress Questionnaire    Feeling of Stress : Not at all  Social Connections: Moderately Isolated (08/15/2022)   Social Connection and Isolation Panel [NHANES]    Frequency of Communication with Friends and Family: Twice a week    Frequency of Social Gatherings with Friends and Family: Once a week    Attends Religious Services: 1 to 4 times per year    Active Member of [employer] or Organizations: No    Attends Banker Meetings: Not on file    Marital Status: Widowed  Intimate Partner Violence: Not At Risk (09/22/2021)   Humiliation, Afraid, Rape, and Kick questionnaire    Fear of Current or Ex-Partner: No    Emotionally Abused: No    Physically Abused: No    Sexually Abused: No   Family History  Problem Relation Age of Onset   Asthma Mother    Alzheimer's disease Mother 60   Hypertension Mother    Other Father        shot    Neuropathy Sister         face   Cancer Maternal Aunt        breast   Cancer Paternal Aunt    Lung cancer Paternal Uncle    Cancer Paternal Grandmother        abdominal   Colon cancer Neg Hx    Esophageal cancer Neg Hx  Inflammatory bowel disease Neg Hx    Liver disease Neg Hx    Pancreatic cancer Neg Hx    Rectal cancer Neg Hx    Stomach cancer Neg Hx    I have reviewed her medical, social, and family history in detail and updated the electronic medical record as necessary.    PHYSICAL EXAMINATION  Ht 5\' 4"  (1.626 m)   Wt 158 lb (71.7 kg)   BMI 27.12 kg/m  Telehealth visit   REVIEW OF DATA  I reviewed the following data at the time of this encounter:  GI Procedures and Studies  1/24 EGD - No gross lesions in the majority of the esophagus. Biopsied. - Salmon-colored mucosal islands in the very distal esophagus were noted, suspicious for short-segment Barrett's esophagus. Biopsied. - Z-line irregular, 35 cm from the incisors. - Non-obstructing Schatzki ring. Disrupted. - After the completion of the EGD, a savory dilation to 16 mm was performed with moderate resistance leading to a mucosal wrent just below the UES. - 4 cm hiatal hernia. - Erythematous mucosa in the stomach. Biopsied. - No gross lesions in the duodenal bulb, in the first portion of the duodenum and in the second portion of the duodenum  Pathology FINAL MICROSCOPIC DIAGNOSIS: A. STOMACH, BIOPSY: - Gastric antral and oxyntic mucosa with features of reactive gastropathy - Negative for H. pylori on HE stain - Negative for intestinal metaplasia or malignancy B. ESOPHAGUS, RANDOM, BIOPSY: - Benign squamous mucosa with no specific pathologic changes -Negative for increased intraepithelial eosinophils - Negative for intestinal metaplasia, dysplasia or malignancy C. ESOPHAGUS, DISTAL, BIOPSY: - Benign squamous mucosa and gastric cardiac mucosa with no specific pathologic changes - Negative for intestinal metaplasia, dysplasia or  malignancy D. SCHATZKI'S RING, BIOPSY: - Benign squamous mucosa and gastric cardiac mucosa with no specific pathologic changes - Negative for intestinal metaplasia, dysplasia or malignancy E. DUODENUM, BIOPSY: - Benign small bowel mucosa with no significant pathologic changes   Laboratory Studies  Reviewed those in epic  Imaging Studies  11/2022 Liver Elastography IMPRESSION: ULTRASOUND ABDOMEN: Question mild fatty infiltration of liver. Otherwise negative exam. ULTRASOUND HEPATIC ELASTOGRAPHY: Median kPa:  4.1 Diagnostic category:  < or = 5 kPa: high probability of being normal   ASSESSMENT  Ms. Rutherford is a 67 y.o. female with a pmh significant for COPD, lung cancer (status post resection), MDD, anxiety, status post hysterectomy, status post appendectomy, previously treated HCV, hiatal hernia, GERD, Schatzki ring.  The patient is seen today for evaluation and management of:  1. Hiatal hernia   2. Gastroesophageal reflux disease without esophagitis   3. Pill dysphagia   4. Epigastric pain   5. Colon cancer screening    The patient is hemodynamically stable (as much as I can tell via telemedicine).  Clinically from a GI perspective, her increase PPI therapy has been helpful for her.  As we did cause a mucosal wrent with going up to 16 mm, I wonder if we can get up to an 18 mm savory dilation and whether that may help her pill dysphagia symptoms further.  It is not clear to me that the hiatal hernia is the actual reason for her epigastric abdominal pain, but we should be mindful of this.  Will plan to repeat her upper endoscopy and if she continues to have issues, then plan for a CT abdomen/pelvis to be performed before we consider higher risk hiatal hernia repair due to her underlying advanced COPD and prior lung cancer.  The risks and benefits  of endoscopic evaluation were discussed with the patient; these include but are not limited to the risk of perforation, infection, bleeding,  missed lesions, lack of diagnosis, severe illness requiring hospitalization, as well as anesthesia and sedation related illnesses.  The patient and/or family is agreeable to proceed.  We will plan for the upper endoscopy in the coming months to allow for further to heal from her COPD exacerbation.  She will update [place] if she has another COPD exacerbation that may cause [place] to have to even put her upper endoscopy out further.  All patient questions were answered to the best of my ability, and the patient agrees to the aforementioned plan of action with follow-up as indicated.   PLAN  Schedule repeat EGD in the hospital with further dilation up to 18 mm if possible Abdominal ultrasound with elastography to be performed Continue PPI 40 mg twice daily Restart Carafate as needed Consider CT abdomen/pelvis pending patient's repeat endoscopy findings   Orders Placed This Encounter  Procedures   Procedural/ Surgical Case Request: ESOPHAGOGASTRODUODENOSCOPY (EGD) WITH PROPOFOL, SAVORY DILATION   Ambulatory referral to Gastroenterology    New Prescriptions   No medications on file   Modified Medications   Modified Medication Previous Medication   ESOMEPRAZOLE (NEXIUM) 40 MG CAPSULE esomeprazole (NEXIUM) 40 MG capsule      Take 1 capsule (40 mg total) by mouth daily.    TAKE 1 CAPSULE BY MOUTH TWICE DAILY BEFORE A MEAL    Planned Follow Up No follow-ups on file.   Total Time in Face-to-Face and in Coordination of Care for patient including independent/personal interpretation/review of prior testing, medical history, examination, medication adjustment, communicating results with the patient directly, and documentation within the EHR is 25 minutes.   Corliss Parish, MD [facility] Office # [phone]

## 2023-05-07 NOTE — Telephone Encounter (Signed)
Returned call to patient. She is having a flare-up with her COPD and would prefer to have video visit to avoid being around a lot of people. Visit today will be changed to my chart video visit. Patient understands to check in through my chart around 11:15 am.

## 2023-05-09 ENCOUNTER — Telehealth: Payer: Self-pay | Admitting: Internal Medicine

## 2023-05-09 NOTE — Telephone Encounter (Signed)
Fax received from Dr. Corliss Parish with [name] to perform a Endoscopy on patient.  Patient needs surgery clearance. Surgery is 08/05/23. Patient was seen on 04/24/23. Office protocol is a risk assessment can be sent to surgeon if patient has been seen in 60 days or less.   Sending to Dr. Sherene Sires for risk assessment or recommendations if patient needs to be seen in office prior to surgical procedure.    Pt has ov scheduled 07/25/23

## 2023-05-10 NOTE — Telephone Encounter (Signed)
Will eval for surgery at time of ov 07/25/23 so tell her to be sure to keep appt

## 2023-05-11 DIAGNOSIS — R1319 Other dysphagia: Secondary | ICD-10-CM | POA: Insufficient documentation

## 2023-05-11 HISTORY — DX: Other dysphagia: R13.19

## 2023-06-06 ENCOUNTER — Other Ambulatory Visit: Payer: Self-pay | Admitting: Adult Health

## 2023-06-06 DIAGNOSIS — F32A Depression, unspecified: Secondary | ICD-10-CM

## 2023-06-11 ENCOUNTER — Other Ambulatory Visit: Payer: Self-pay | Admitting: Adult Health

## 2023-06-11 ENCOUNTER — Encounter: Payer: Self-pay | Admitting: Adult Health

## 2023-06-11 DIAGNOSIS — F32A Depression, unspecified: Secondary | ICD-10-CM

## 2023-06-11 NOTE — Telephone Encounter (Signed)
Is pt due for CPE? Please advise will fill for 30 days

## 2023-06-11 NOTE — Telephone Encounter (Signed)
Pt called to say she only has one pill left.  Please approve refill at your earliest convenience.  [id number] - [place], [place] - [address] Phone: [phone]  Fax: [phone]

## 2023-06-13 MED ORDER — TRAZODONE HCL 100 MG PO TABS
100.0000 mg | ORAL_TABLET | Freq: Every evening | ORAL | 0 refills | Status: DC | PRN
Start: 2023-06-13 — End: 2023-08-06

## 2023-06-14 ENCOUNTER — Encounter: Payer: Self-pay | Admitting: Internal Medicine

## 2023-07-05 ENCOUNTER — Other Ambulatory Visit: Payer: Self-pay | Admitting: Adult Health

## 2023-07-05 DIAGNOSIS — F321 Major depressive disorder, single episode, moderate: Secondary | ICD-10-CM

## 2023-07-11 ENCOUNTER — Telehealth: Payer: Self-pay | Admitting: Medical Oncology

## 2023-07-11 ENCOUNTER — Inpatient Hospital Stay: Payer: Medicare HMO

## 2023-07-11 ENCOUNTER — Ambulatory Visit (HOSPITAL_COMMUNITY): Payer: Medicare HMO

## 2023-07-11 NOTE — Telephone Encounter (Signed)
Pt notified of lab appt before CT and that someone will call her with a f/u appt .

## 2023-07-12 ENCOUNTER — Telehealth: Payer: Self-pay | Admitting: Physician Assistant

## 2023-07-15 ENCOUNTER — Ambulatory Visit (HOSPITAL_COMMUNITY)
Admission: RE | Admit: 2023-07-15 | Discharge: 2023-07-15 | Disposition: A | Discharge status: Home / Self Care | Payer: Medicare HMO | Source: Ambulatory Visit | Attending: Physician Assistant | Admitting: Physician Assistant

## 2023-07-15 ENCOUNTER — Inpatient Hospital Stay: Payer: Medicare HMO | Admitting: Internal Medicine

## 2023-07-15 ENCOUNTER — Inpatient Hospital Stay: Payer: Medicare HMO | Attending: Physician Assistant

## 2023-07-15 ENCOUNTER — Other Ambulatory Visit: Payer: Self-pay

## 2023-07-15 DIAGNOSIS — C3492 Malignant neoplasm of unspecified part of left bronchus or lung: Secondary | ICD-10-CM

## 2023-07-15 DIAGNOSIS — Z885 Allergy status to narcotic agent status: Secondary | ICD-10-CM | POA: Insufficient documentation

## 2023-07-15 DIAGNOSIS — C3491 Malignant neoplasm of unspecified part of right bronchus or lung: Secondary | ICD-10-CM

## 2023-07-15 DIAGNOSIS — I700 Atherosclerosis of aorta: Secondary | ICD-10-CM | POA: Insufficient documentation

## 2023-07-15 DIAGNOSIS — J439 Emphysema, unspecified: Secondary | ICD-10-CM | POA: Insufficient documentation

## 2023-07-15 DIAGNOSIS — R519 Headache, unspecified: Secondary | ICD-10-CM | POA: Insufficient documentation

## 2023-07-15 DIAGNOSIS — Z90710 Acquired absence of both cervix and uterus: Secondary | ICD-10-CM | POA: Insufficient documentation

## 2023-07-15 DIAGNOSIS — Z9049 Acquired absence of other specified parts of digestive tract: Secondary | ICD-10-CM | POA: Insufficient documentation

## 2023-07-15 DIAGNOSIS — J432 Centrilobular emphysema: Secondary | ICD-10-CM | POA: Diagnosis not present

## 2023-07-15 DIAGNOSIS — Z880 Allergy status to penicillin: Secondary | ICD-10-CM | POA: Insufficient documentation

## 2023-07-15 DIAGNOSIS — C3412 Malignant neoplasm of upper lobe, left bronchus or lung: Secondary | ICD-10-CM | POA: Diagnosis not present

## 2023-07-15 DIAGNOSIS — Z886 Allergy status to analgesic agent status: Secondary | ICD-10-CM | POA: Diagnosis not present

## 2023-07-15 DIAGNOSIS — Z882 Allergy status to sulfonamides status: Secondary | ICD-10-CM | POA: Diagnosis not present

## 2023-07-15 DIAGNOSIS — Z79899 Other long term (current) drug therapy: Secondary | ICD-10-CM | POA: Diagnosis not present

## 2023-07-15 LAB — CBC WITH DIFFERENTIAL (CANCER CENTER ONLY)
Abs Immature Granulocytes: 0.02 10*3/uL (ref 0.00–0.07)
Basophils Absolute: 0.1 10*3/uL (ref 0.0–0.1)
Basophils Relative: 1 %
Eosinophils Absolute: 0.2 10*3/uL (ref 0.0–0.5)
Eosinophils Relative: 3 %
HCT: 42.6 % (ref 36.0–46.0)
Hemoglobin: 13.7 g/dL (ref 12.0–15.0)
Immature Granulocytes: 0 %
Lymphocytes Relative: 19 %
Lymphs Abs: 1.4 10*3/uL (ref 0.7–4.0)
MCH: 26.9 pg (ref 26.0–34.0)
MCHC: 32.2 g/dL (ref 30.0–36.0)
MCV: 83.5 fL (ref 80.0–100.0)
Monocytes Absolute: 0.6 10*3/uL (ref 0.1–1.0)
Monocytes Relative: 8 %
Neutro Abs: 5 10*3/uL (ref 1.7–7.7)
Neutrophils Relative %: 69 %
Platelet Count: 326 10*3/uL (ref 150–400)
RBC: 5.1 MIL/uL (ref 3.87–5.11)
RDW: 13.8 % (ref 11.5–15.5)
WBC Count: 7.2 10*3/uL (ref 4.0–10.5)
nRBC: 0 % (ref 0.0–0.2)

## 2023-07-15 LAB — CMP (CANCER CENTER ONLY)
ALT: 12 U/L (ref 0–44)
AST: 16 U/L (ref 15–41)
Albumin: 4.3 g/dL (ref 3.5–5.0)
Alkaline Phosphatase: 112 U/L (ref 38–126)
Anion gap: 6 (ref 5–15)
BUN: 12 mg/dL (ref 8–23)
CO2: 29 mmol/L (ref 22–32)
Calcium: 9.7 mg/dL (ref 8.9–10.3)
Chloride: 102 mmol/L (ref 98–111)
Creatinine: 0.92 mg/dL (ref 0.44–1.00)
GFR, Estimated: >60 mL/min (ref >60)
Glucose, Bld: 112 mg/dL — ABNORMAL HIGH (ref 70–99)
Potassium: 4.4 mmol/L (ref 3.5–5.1)
Sodium: 137 mmol/L (ref 135–145)
Total Bilirubin: 0.6 mg/dL (ref 0.3–1.2)
Total Protein: 7.8 g/dL (ref 6.5–8.1)

## 2023-07-15 MED ORDER — SODIUM CHLORIDE (PF) 0.9 % IJ SOLN
INTRAMUSCULAR | Status: AC
  Filled 2023-07-15: qty 50

## 2023-07-15 MED ORDER — IOHEXOL 300 MG/ML  SOLN
75.0000 mL | Freq: Once | INTRAMUSCULAR | Status: AC | PRN
  Administered 2023-07-15: 75 mL via INTRAVENOUS

## 2023-07-17 ENCOUNTER — Ambulatory Visit (HOSPITAL_COMMUNITY): Payer: Medicare HMO

## 2023-07-17 NOTE — Progress Notes (Signed)
[facility] OFFICE PROGRESS NOTE  Shirline Frees, NP [address] [place] [address]  DIAGNOSIS:  1) Stage Ib (T2, N0, M0) non-small cell lung cancer, adenocarcinoma diagnosed in June 2019. 2) stage Ia (T1 a, N0, M0) non-small cell lung cancer, adenocarcinoma status post  PRIOR THERAPY: 1) status post wedge resection of the left upper lobe nodule in addition to lingular segmentectomy and mediastinal lymph node sampling under the care of Dr. Dorris Fetch on March 13, 2018.  Right lower lobe segmentectomy under the care of Dr. Dorris Fetch on December 18, 2019.   CURRENT THERAPY: Observation   INTERVAL HISTORY: Anegla Brietzke 67 y.o. female returns to clinic today for a follow up visit.  The patient was last seen 1 year ago by Dr. Arbutus Ped and myself.  The patient is currently on observation for history of lung cancer.  She is feeling fine at this time.  She is scheduled to have an EGD with Dr. Meridee Score next month on, however, she is going to cancel because her dog has a tumor and has surgery tomorrow. She also follows closely with Dr. Sherene Sires from pulmonary medicine for her COPD. She thinks she has a flare up at this time with some increased cough. Today she denies any fever, chills, or unexplained weight loss. She sometimes wakes up a little damp but denies large volume of night sweats. Denies any nausea, vomiting, diarrhea, or constipation. She has baseline dyspnea on exertion this stable since 2022 when she was hospitalized for pneumonia. She reports increased cough if she talks to much. Denies any chest pain or hemoptysis.  Denies any headache or visual changes besides baseline headaches. She had a bad migraine about 4-6 weeks ago. She recently had a restaging CT scan.  She is here today for evaluation to review her scan results.   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    Hepatitis C    lung ca dx'd 03/2018   Tremor, unspecified    non-specific; takes inderal.     ALLERGIES:  is allergic to aspirin, trelegy ellipta [fluticasone-umeclidin-vilant], morphine sulfate, oxycodone, glycopyrrolate, lactose intolerance (gi), penicillins, and sulfa antibiotics.  MEDICATIONS:  Current Outpatient Medications  Medication Sig Dispense Refill   albuterol (PROVENTIL) (2.5 MG/3ML) 0.083% nebulizer solution Take 3 mLs (2.5 mg total) by nebulization every 4 (four) hours as needed for wheezing or shortness of breath. 75 mL 12   albuterol (VENTOLIN HFA) 108 (90 Base) MCG/ACT inhaler Inhale 2 puffs into the lungs every 6 (six) hours as needed for wheezing or shortness of breath. 8 g 0   Budeson-Glycopyrrol-Formoterol (BREZTRI AEROSPHERE) 160-9-4.8 MCG/ACT AERO Inhale 2 puffs into the lungs 2 (two) times daily. 3 each 4   clonazePAM (KLONOPIN) 0.5 MG tablet TAKE 1 TABLET BY MOUTH THREE TIMES DAILY AS NEEDED FOR ANXIETY 90 tablet 2   doxycycline (VIBRA-TABS) 100 MG tablet Take 1 tablet (100 mg total) by mouth 2 (two) times daily. 20 tablet 0   esomeprazole (NEXIUM) 40 MG capsule Take 1 capsule (40 mg total) by mouth daily. 60 capsule 11   fluticasone (FLONASE) 50 MCG/ACT nasal spray Use 1 spray(s) in each nostril once  daily 16 g 0   guaiFENesin (MUCINEX) 600 MG 12 hr tablet Take 600 mg by mouth 2 (two) times daily as needed for cough or to loosen phlegm.     PARoxetine (PAXIL) 40 MG tablet Take 1 tablet by mouth once daily 90 tablet 0   propranolol ER (INDERAL LA) 60 MG 24 hr capsule Take 1 capsule (60 mg total) by mouth daily. 90 capsule 3   sucralfate (CARAFATE) 1 g tablet Take 1 tablet (1 g total) by mouth 4 (four) times daily. Please give patient instructions for slurry. 60 tablet 2   traZODone (DESYREL)  100 MG tablet Take 1 tablet (100 mg total) by mouth at bedtime as needed. for sleep 30 tablet 0   No current facility-administered medications for this visit.    SURGICAL HISTORY:  Past Surgical History:  Procedure Laterality Date   ABDOMINAL HYSTERECTOMY  1986   menorrhagia; hx of cryo for abnormal paps   APPENDECTOMY     removed in 1986 per pt   BIOPSY  10/31/2022   Procedure: BIOPSY;  Surgeon: Lemar Lofty., MD;  Location: [facility];  Service: Gastroenterology;;   CATARACT EXTRACTION Bilateral 07/2022   ESOPHAGOGASTRODUODENOSCOPY (EGD) WITH PROPOFOL N/A 10/31/2022   Procedure: ESOPHAGOGASTRODUODENOSCOPY (EGD) WITH PROPOFOL;  Surgeon: Lemar Lofty., MD;  Location: [facility];  Service: Gastroenterology;  Laterality: N/A;   INTERCOSTAL NERVE BLOCK Right 12/18/2019   Procedure: Intercostal Nerve Block;  Surgeon: Loreli Slot, MD;  Location: [facility] OR;  Service: Thoracic;  Laterality: Right;   LOBECTOMY Left 03/13/2018   Procedure: Lingula section of left upper lobe lobectomy;  Surgeon: Loreli Slot, MD;  Location: [facility] OR;  Service: Thoracic;  Laterality: Left;   NODE DISSECTION  12/18/2019   Procedure: Node Dissection;  Surgeon: Loreli Slot, MD;  Location: [facility] OR;  Service: Thoracic;;   SAVORY DILATION N/A 10/31/2022   Procedure: Jacklyn Shell;  Surgeon: Lemar Lofty., MD;  Location: [facility];  Service: Gastroenterology;  Laterality: N/A;   VIDEO ASSISTED THORACOSCOPY (VATS)/WEDGE RESECTION Left 03/13/2018   Procedure: VIDEO ASSISTED THORACOSCOPY (VATS)/WEDGE RESECTION;  Surgeon: Loreli Slot, MD;  Location: [facility] OR;  Service: Thoracic;  Laterality: Left;    REVIEW OF SYSTEMS:   Constitutional: Negative for appetite change, chills, fatigue, fever and unexpected weight change.  HENT: Negative for mouth sores, nosebleeds, sore throat and trouble swallowing.   Eyes: Negative for eye problems and icterus.  Respiratory:  Negative for  hemoptysis, and wheezing.  Positive for similar Shortness of breath and cough.  Cardiovascular: Negative for chest pain and leg swelling.  Gastrointestinal: Negative for abdominal pain, constipation, diarrhea, nausea and vomiting.  Genitourinary: Negative for bladder incontinence, difficulty urinating, dysuria, frequency and hematuria.   Musculoskeletal: Negative for back pain, gait problem, neck pain and neck stiffness.  Skin: Negative for itching and rash.  Neurological: Positive for baseline occasional headaches. Negative for dizziness, extremity weakness, gait problem, light-headedness and seizures.  Hematological: Negative for adenopathy. Does not bruise/bleed easily.  Psychiatric/Behavioral: Negative for confusion, depression and sleep disturbance. The patient is not nervous/anxious.     PHYSICAL EXAMINATION:  There were no vitals taken for this visit.  ECOG PERFORMANCE STATUS: 1  Physical Exam  Constitutional: Oriented to person, place, and time and well-developed, well-nourished, and in no distress.  HENT:  Head: Normocephalic and atraumatic.  Mouth/Throat: Oropharynx is clear and moist. No oropharyngeal exudate.  Eyes: Conjunctivae are normal. Right eye exhibits no discharge. Left eye exhibits no discharge. No  scleral icterus.  Neck: Normal range of motion. Neck supple.  Cardiovascular: Normal rate, regular rhythm, normal heart sounds and intact distal pulses.   Pulmonary/Chest: Effort normal. Quiet breath sounds bilaterally. No respiratory distress. No wheezes. No rales.  Abdominal: Soft. Bowel sounds are normal. Exhibits no distension and no mass. There is no tenderness.  Musculoskeletal: Normal range of motion. Exhibits no edema.  Lymphadenopathy:    No cervical adenopathy.  Neurological: Alert and oriented to person, place, and time. Gait normal. Coordination normal. Uses wheelchair during visit  Skin: Skin is warm and dry. No rash noted. Not diaphoretic. No  erythema. No pallor.  Psychiatric: Mood, memory and judgment normal.  Vitals reviewed.  LABORATORY DATA: Lab Results  Component Value Date   WBC 7.2 07/15/2023   HGB 13.7 07/15/2023   HCT 42.6 07/15/2023   MCV 83.5 07/15/2023   PLT 326 07/15/2023      Chemistry      Component Value Date/Time   NA 137 07/15/2023 1309   K 4.4 07/15/2023 1309   CL 102 07/15/2023 1309   CO2 29 07/15/2023 1309   BUN 12 07/15/2023 1309   CREATININE 0.92 07/15/2023 1309   CREATININE 0.73 10/27/2021 1543      Component Value Date/Time   CALCIUM 9.7 07/15/2023 1309   ALKPHOS 112 07/15/2023 1309   AST 16 07/15/2023 1309   ALT 12 07/15/2023 1309   ALT 20 10/10/2017 1653   BILITOT 0.6 07/15/2023 1309       RADIOGRAPHIC STUDIES:  No results found.   ASSESSMENT/PLAN:  This is a very pleasant 67 year old Caucasian female with a history of stage Ib non-small cell lung cancer, adenocarcinoma status post left upper segmental resection in addition to lingulectomy with mediastinal lymph node dissection in 2019.   She was also diagnosed in March 2021 with stage Ia non-small cell lung cancer, adenocarcinoma status post right lower lobe segmentectomy.   She has been on observation since that time and feeling well.   The patient was lost to follow-up since July 2022    The patient recently had a restaging CT scan performed.  The patient was seen with Dr. Arbutus Ped today.  Dr. Arbutus Ped personally independently reviewed the scan and discussed the results with the patient today.  The scan showed no new or progressive findings except for new mild patchy tree-in-bud type opacity in the medial segment right middle lobe, favoring inflammatory. She is going to try to follow up with her pulmonologist or PCP for her COPD and possible flare up.   Dr. Arbutus Ped recommends that she continue on observation with a repeat CT scan of the chest in 1 year.   We will see her back for follow-up at that time to review her scan  results.   The patient was advised to call immediately if she has any concerning symptoms in the interval. The patient voices understanding of current disease status and treatment options and is in agreement with the current care plan. All questions were answered. The patient knows to call the clinic with any problems, questions or concerns. We can certainly see the patient much sooner if necessary  No orders of the defined types were placed in this encounter.      Norleen Xie L Aivan Fillingim, PA-C 07/17/23  ADDENDUM: Hematology/Oncology Attending:  I had a face-to-face encounter with the patient today.  I reviewed her records, lab, scan and recommended her care plan.  This is a very pleasant 67 years old white female diagnosed initially with  stage Ib non-small cell lung cancer, adenocarcinoma in June 2019 as well as a stage Ia non-small cell lung cancer, adenocarcinoma in March 2021 status post surgical resection and has been on observation.  The patient is feeling fine today with no concerning complaints.  She was admitted to the hospital few months ago with COPD exacerbation and recurrent pneumonia. She had repeat CT scan of the chest performed recently.  I personally and independently reviewed the scan and discussed the result with the patient today. Her scan showed no concerning findings for disease recurrence or metastasis but she has some airspace disease in the right middle lobe that need close monitoring. I recommended for the patient to continue on observation with repeat CT scan of the chest in 1 year. She will continue her routine follow-up visit and evaluation by Dr. Sherene Sires for her COPD. The patient was advised to call immediately if she has any other concerning symptoms in the interval. The total time spent in the appointment was 20 minutes. Disclaimer: This note was dictated with voice recognition software. Similar sounding words can inadvertently be transcribed and may be missed  upon review. Lajuana Matte, MD

## 2023-07-22 ENCOUNTER — Other Ambulatory Visit: Payer: Self-pay | Admitting: Adult Health

## 2023-07-22 ENCOUNTER — Inpatient Hospital Stay (HOSPITAL_BASED_OUTPATIENT_CLINIC_OR_DEPARTMENT_OTHER): Payer: Medicare HMO | Admitting: Physician Assistant

## 2023-07-22 VITALS — BP 135/75 | HR 80 | Temp 98.0°F | Resp 16 | Ht 64.0 in | Wt 158.3 lb

## 2023-07-22 DIAGNOSIS — R519 Headache, unspecified: Secondary | ICD-10-CM | POA: Diagnosis not present

## 2023-07-22 DIAGNOSIS — Z886 Allergy status to analgesic agent status: Secondary | ICD-10-CM | POA: Diagnosis not present

## 2023-07-22 DIAGNOSIS — Z79899 Other long term (current) drug therapy: Secondary | ICD-10-CM | POA: Diagnosis not present

## 2023-07-22 DIAGNOSIS — J439 Emphysema, unspecified: Secondary | ICD-10-CM | POA: Diagnosis not present

## 2023-07-22 DIAGNOSIS — Z882 Allergy status to sulfonamides status: Secondary | ICD-10-CM | POA: Diagnosis not present

## 2023-07-22 DIAGNOSIS — Z885 Allergy status to narcotic agent status: Secondary | ICD-10-CM | POA: Diagnosis not present

## 2023-07-22 DIAGNOSIS — I700 Atherosclerosis of aorta: Secondary | ICD-10-CM | POA: Diagnosis not present

## 2023-07-22 DIAGNOSIS — C3491 Malignant neoplasm of unspecified part of right bronchus or lung: Secondary | ICD-10-CM

## 2023-07-22 DIAGNOSIS — R251 Tremor, unspecified: Secondary | ICD-10-CM

## 2023-07-22 DIAGNOSIS — C3412 Malignant neoplasm of upper lobe, left bronchus or lung: Secondary | ICD-10-CM | POA: Diagnosis not present

## 2023-07-22 DIAGNOSIS — C3492 Malignant neoplasm of unspecified part of left bronchus or lung: Secondary | ICD-10-CM | POA: Diagnosis not present

## 2023-07-22 DIAGNOSIS — Z880 Allergy status to penicillin: Secondary | ICD-10-CM | POA: Diagnosis not present

## 2023-07-23 ENCOUNTER — Telehealth: Payer: Self-pay | Admitting: Gastroenterology

## 2023-07-23 ENCOUNTER — Telehealth: Payer: Self-pay | Admitting: Internal Medicine

## 2023-07-23 MED ORDER — PREDNISONE 10 MG PO TABS: ORAL_TABLET | ORAL | 0 refills | Status: DC

## 2023-07-23 MED ORDER — DOXYCYCLINE HYCLATE 100 MG PO TABS: 100.0000 mg | ORAL_TABLET | Freq: Two times a day (BID) | ORAL | 0 refills | Status: DC

## 2023-07-23 NOTE — Telephone Encounter (Signed)
Pt returned call

## 2023-07-23 NOTE — Telephone Encounter (Signed)
Lm x1 for patient.  

## 2023-07-23 NOTE — Telephone Encounter (Signed)
PT had a CT scan a week ago and the results showed an area of inflammation on the right side and she has been experiencing an exacerbation. Usually we RX Pred and Antibx. Can we call this in for her?  She just does not feel well enough to come for a visit.  Pharm is [facility] in [place].   Her # is [phone]

## 2023-07-23 NOTE — Telephone Encounter (Signed)
Patient is aware of below message/recommendations and voiced her understanding.  Prednisone and doxy has been sent to preferred pharmacy.  Nothing further needed.

## 2023-07-23 NOTE — Telephone Encounter (Signed)
Okay for refill?  

## 2023-07-23 NOTE — Telephone Encounter (Signed)
Called and spoke to patient.  She stated that she had recent CT that showed inflammation. She is questioning if abx and prednisone should be prescribed.  C/o increased SOB and prod cough with light green sputum x10d. Denied f/c/s or additional sx.  She is using albuterol solution once daily, Breztri BID and Mucinex 400mg  BID. Pending appt 08/26/2023.  MW, please advise. Thanks

## 2023-07-23 NOTE — Telephone Encounter (Signed)
The pt was scheduled while in the office I will send to [place].  Thank you

## 2023-07-23 NOTE — Telephone Encounter (Signed)
Patient requesting to cancel procedure at [facility] and reschedule for beginning of the year due to her dog having surgery in November.   Please advise.

## 2023-07-24 NOTE — Telephone Encounter (Signed)
Left message for patient. Patient has been r/s to 10/07/23 @ [facility]. Procedure time 9:45 am. Patient will need to arrive at the hospital at 7:15 am. Message will also be sent to patient via my chart.

## 2023-07-25 ENCOUNTER — Ambulatory Visit: Payer: Medicare HMO | Admitting: Internal Medicine

## 2023-08-01 ENCOUNTER — Ambulatory Visit: Payer: Medicare HMO | Admitting: Internal Medicine

## 2023-08-04 ENCOUNTER — Other Ambulatory Visit: Payer: Self-pay | Admitting: Adult Health

## 2023-08-04 DIAGNOSIS — F32A Depression, unspecified: Secondary | ICD-10-CM

## 2023-08-12 ENCOUNTER — Ambulatory Visit: Payer: Medicare HMO | Admitting: Internal Medicine

## 2023-08-25 NOTE — Progress Notes (Deleted)
[name], female    DOB: 13-May-1956,    MRN: [id number]   Brief patient profile:  31 yowf  quit smoking 2019 p started albuterol  inhalers around 2014 then added symbicort changed to wixella  due to cost referred to pulmonary clinic 06/29/2021 by Dr Hassan Rowan  who recently changed to breztri which seemed to helped even more.   PFTs 12/14/19 c/w GOLD 3 copd   04/12/21 oncology note DIAGNOSIS:  1) Stage Ib (T2, N0, M0) non-small cell lung cancer, adenocarcinoma diagnosed in June 2019. 2) stage Ia (T1 a, N0, M0) non-small cell lung cancer, adenocarcinoma status post   PRIOR THERAPY:  1) status post wedge resection of the left upper lobe nodule in addition to lingular segmentectomy and mediastinal lymph node sampling under the care of Dr. Dorris Fetch on March 13, 2018.  Right lower lobe segmentectomy under the care of Dr. Dorris Fetch on December 18, 2019.   CURRENT THERAPY: Observation  History of Present Illness  06/29/2021  Pulmonary/ 1st office eval/[name]  Chief Complaint  Patient presents with   Pulmonary Consult     Referred by Dr. Rush Barer. Pt has hx of lung ca and she was told back in 2018 she has COPD. She gets winded just walking around her house. She had PNA approx 2 months.    Dyspnea:  one room to the next / shops on line  Cough: prednisone helped still some residual dry cough  Sleep: on side / electric bed 30 degrees  SABA use: one bid hfa/ neb once daily  Rec Plan A = Automatic = Always=    wixella one twice daily  = trelegy 100 once daily  or Breztri 2 puff every 12 hours  Breathe out thru nose  Plan B = Backup (to supplement plan A, not to replace it) Only use your albuterol inhaler as a rescue medication  Plan C = Crisis (instead of Plan B but only if Plan B stops working) - only use your albuterol nebulizer if you first try Plan B and it fails to help Call we the cheapest alternatives  for example wixellla / incruse   = trelegy      08/14/2022  f/u ov/[name]  re: gold 3 maint on breztri  / could not tol bisoprolol due to shaking but helped breathing  Chief Complaint  Patient presents with   Follow-up    Breathing has improved slightly. She has not used her albuterol inhaler in the past wk. She has non prod cough.    Dyspnea:  around the house  Cough: sometimes with coughing  Sleeping: 6 in bed blocks on side  SABA use: rarely needing hfa/ much less neb  02 has it, not using,sats usually > 90% walking on RA though very sedentary "they fired" me from rehab Rec Please remember to go to the lab department   for your tests - we will call you with the results when they are available. Work on inhaler technique:  My office will be contacting you by phone for referral to pulmonary rehab> done - says failed though able to bicycle fine     02/12/2023  f/u ov/[name] re: GOLD 3 copd maint on breztri   Chief Complaint  Patient presents with   Follow-up    Cough and SOB with exertion persistent.  Dyspnea:  walking to Bathroom and back is about her limit  Cough: not productive  Sleeping:30 degrees electric bed  SABA use: albuterol before ex made no difference /  02: not using at all Recs Make sure you check your oxygen saturation  AT  your highest level of activity (not after you stop)   to be sure it stays over 90%   Pace yourself aiming for 30 min of sustained exercise at a level where you are short of breath but not out of breath. Ask your neurologist about weaning off Inderal Work on inhaler technique:   04/24/23 [facility] > rx doxy/ pred by [facility]    08/26/2023 18m  f/u ov/[name] re: ***   maint on ***  No chief complaint on file.   Dyspnea:  *** Cough: *** Sleeping: *** resp cc  SABA use: *** 02: ***  Lung cancer screening :  ***    No obvious day to day or daytime variability or assoc excess/ purulent sputum or mucus plugs or hemoptysis or cp or chest tightness, subjective wheeze or overt sinus or hb symptoms.    Also denies any obvious  fluctuation of symptoms with weather or environmental changes or other aggravating or alleviating factors except as outlined above   No unusual exposure hx or h/o childhood pna/ asthma or knowledge of premature birth.  Current Allergies, Complete Past Medical History, Past Surgical History, Family History, and Social History were reviewed in [employer] record.  ROS  The following are not active complaints unless bolded Hoarseness, sore throat, dysphagia, dental problems, itching, sneezing,  nasal congestion or discharge of excess mucus or purulent secretions, ear ache,   fever, chills, sweats, unintended wt loss or wt gain, classically pleuritic or exertional cp,  orthopnea pnd or arm/hand swelling  or leg swelling, presyncope, palpitations, abdominal pain, anorexia, nausea, vomiting, diarrhea  or change in bowel habits or change in bladder habits, change in stools or change in urine, dysuria, hematuria,  rash, arthralgias, visual complaints, headache, numbness, weakness or ataxia or problems with walking or coordination,  change in mood or  memory.        No outpatient medications have been marked as taking for the 08/26/23 encounter (Appointment) with Nyoka Cowden, MD.          No obvious day to day or daytime variability or assoc excess/ purulent sputum or mucus plugs or hemoptysis or cp or chest tightness, subjective wheeze or overt sinus or hb symptoms.   Sleeping as above without nocturnal  or early am exacerbation  of respiratory  c/o's or need for noct saba. Also denies any obvious fluctuation of symptoms with weather or environmental changes or other aggravating or alleviating factors except as outlined above   No unusual exposure hx or h/o childhood pna/ asthma or knowledge of premature birth.  Current Allergies, Complete Past Medical History, Past Surgical History, Family History, and Social History were reviewed in [employer]  record.  ROS  The following are not active complaints unless bolded Hoarseness, sore throat, dysphagia, dental problems, itching, sneezing,  nasal congestion or discharge of excess mucus or purulent secretions, ear ache,   fever, chills, sweats, unintended wt loss or wt gain, classically pleuritic or exertional cp,  orthopnea pnd or arm/hand swelling  or leg swelling, presyncope, palpitations, abdominal pain, anorexia, nausea, vomiting, diarrhea  or change in bowel habits or change in bladder habits, change in stools or change in urine, dysuria, hematuria,  rash, arthralgias, visual complaints, headache, numbness, weakness or ataxia or problems with walking or coordination,  change in mood or  memory, tremor        Current  Meds  Medication Sig   albuterol (PROVENTIL) (2.5 MG/3ML) 0.083% nebulizer solution Take 3 mLs (2.5 mg total) by nebulization every 4 (four) hours as needed for wheezing or shortness of breath.   albuterol (VENTOLIN HFA) 108 (90 Base) MCG/ACT inhaler Inhale 2 puffs into the lungs every 6 (six) hours as needed for wheezing or shortness of breath.   Budeson-Glycopyrrol-Formoterol (BREZTRI AEROSPHERE) 160-9-4.8 MCG/ACT AERO Inhale 2 puffs into the lungs 2 (two) times daily.   clonazePAM (KLONOPIN) 0.5 MG tablet TAKE 1 TABLET BY MOUTH THREE TIMES DAILY AS NEEDED FOR ANXIETY   esomeprazole (NEXIUM) 40 MG capsule TAKE 1 CAPSULE BY MOUTH TWICE DAILY BEFORE A MEAL   fluticasone (FLONASE) 50 MCG/ACT nasal spray Use 1 spray(s) in each nostril once daily   guaiFENesin (MUCINEX) 600 MG 12 hr tablet Take 600 mg by mouth 2 (two) times daily as needed for cough or to loosen phlegm.   PARoxetine (PAXIL) 40 MG tablet Take 1 tablet by mouth once daily   propranolol ER (INDERAL LA) 60 MG 24 hr capsule Take 1 capsule (60 mg total) by mouth daily.   sucralfate (CARAFATE) 1 g tablet Take 1 tablet (1 g total) by mouth 4 (four) times daily. Please give patient instructions for slurry.   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    Hepatitis C    lung ca dx'd 03/2018   Tremor, unspecified    non-specific; takes inderal.          Objective:     Wts  08/26/2023      ***  02/12/2023       156  08/14/2022      150  04/23/2022       156   03/07/2022        156   09/07/21 125 lb (56.7 kg)  06/29/21 120 lb (54.4 kg)  06/07/21 119 lb 11.2 oz (54.3 kg)   Vital signs reviewed  08/26/2023  - Note at rest 02 sats  ***% on ***   General appearance:    ***     Mild bar***            Assessment

## 2023-08-26 ENCOUNTER — Ambulatory Visit: Payer: Medicare HMO | Admitting: Internal Medicine

## 2023-08-28 ENCOUNTER — Ambulatory Visit (INDEPENDENT_AMBULATORY_CARE_PROVIDER_SITE_OTHER): Payer: Medicare HMO | Admitting: Internal Medicine

## 2023-08-28 ENCOUNTER — Encounter: Payer: Self-pay | Admitting: Internal Medicine

## 2023-08-28 VITALS — BP 114/76 | HR 69 | Temp 97.6°F | Ht 64.0 in | Wt 160.0 lb

## 2023-08-28 DIAGNOSIS — J449 Chronic obstructive pulmonary disease, unspecified: Secondary | ICD-10-CM

## 2023-08-28 MED ORDER — PREDNISONE 10 MG PO TABS: ORAL_TABLET | ORAL | 0 refills | Status: DC

## 2023-08-28 NOTE — Progress Notes (Signed)
[name], female    DOB: 1956/02/13,    MRN: [id number]   Brief patient profile:  27 yowf  quit smoking 2019 p started albuterol  inhalers around 2014 then added symbicort changed to wixella  due to cost referred to pulmonary clinic 06/29/2021 by [name]  who recently changed to breztri which seemed to helped even more.   PFTs 12/14/19 c/w GOLD 3 copd   04/12/21 oncology note DIAGNOSIS:  1) Stage Ib (T2, N0, M0) non-small cell lung cancer, adenocarcinoma diagnosed in June 2019. 2) stage Ia (T1 a, N0, M0) non-small cell lung cancer, adenocarcinoma status post   PRIOR THERAPY:  1) status post wedge resection of the left upper lobe nodule in addition to lingular segmentectomy and mediastinal lymph node sampling under the care of Patricia. Dorris Archer on March 13, 2018.  Right lower lobe segmentectomy under the care of Patricia. Dorris Archer on December 18, 2019.   CURRENT THERAPY: Observation  History of Present Illness  06/29/2021  Pulmonary/ 1st office eval/[name]  Chief Complaint  Patient presents with   Pulmonary Consult     Referred by [name]. Pt has hx of lung ca and she was told back in 2018 she has COPD. She gets winded just walking around her house. She had PNA approx 2 months.    Dyspnea:  one room to the next / shops on line  Cough: prednisone helped still some residual dry cough  Sleep: on side / electric bed 30 degrees  SABA use: one bid hfa/ neb once daily  Rec Plan A = Automatic = Always=    wixella one twice daily  = trelegy 100 once daily  or Breztri 2 puff every 12 hours  Breathe out thru nose  Plan B = Backup (to supplement plan A, not to replace it) Only use your albuterol inhaler as a rescue medication  Plan C = Crisis (instead of Plan B but only if Plan B stops working) - only use your albuterol nebulizer if you first try Plan B and it fails to help Call we the cheapest alternatives  for example wixellla / incruse   = trelegy      08/14/2022  f/u ov/[name]  re: gold 3 maint on breztri  / could not tol bisoprolol due to shaking but helped breathing  Chief Complaint  Patient presents with   Follow-up    Breathing has improved slightly. She has not used her albuterol inhaler in the past wk. She has non prod cough.    Dyspnea:  around the house  Cough: sometimes with coughing  Sleeping: 6 in bed blocks on side  SABA use: rarely needing hfa/ much less neb  02 has it, not using,sats usually > 90% walking on RA though very sedentary "they fired" me from rehab Rec Please remember to go to the lab department   for your tests - we will call you with the results when they are available. Work on inhaler technique:  My office will be contacting you by phone for referral to pulmonary rehab> done - says failed though able to bicycle fine     02/12/2023  f/u ov/[name] re: GOLD 3 copd maint on breztri   Chief Complaint  Patient presents with   Follow-up    Cough and SOB with exertion persistent.  Dyspnea:  walking to Bathroom and back is about her limit  Cough: not productive  Sleeping:30 degrees electric bed  SABA use: albuterol before ex made no difference /  02: not using at all Recs Make sure you check your oxygen saturation  AT  your highest level of activity (not after you stop)   to be sure it stays over 90%   Pace yourself aiming for 30 min of sustained exercise at a level where you are short of breath but not out of breath. Ask your neurologist about weaning off Inderal Work on inhaler technique:   04/24/23 Aecopd > rx doxy/ pred by [facility]    08/28/2023 6m  f/u ov/[name] re: GOLD 3 copd   maint on breztri   Chief Complaint  Patient presents with   Follow-up    SOB, cough, wheeze x 2 weeks.  Dyspnea:  room to room  Cough: mucus is white  Sleeping: 30 degrees electric  resp cc  SABA use: up to once or twice daily  hfa/ neb not today  02: none   Lung cancer screening :  07/15/23     No obvious day to day or daytime variability or assoc   purulent sputum or mucus plugs or hemoptysis or cp or chest tightness, subjective wheeze or overt sinus or hb symptoms.   Also denies any obvious fluctuation of symptoms with weather or environmental changes or other aggravating or alleviating factors except as outlined above   No unusual exposure hx or h/o childhood pna/ asthma or knowledge of premature birth.  Current Allergies, Complete Past Medical History, Past Surgical History, Family History, and Social History were reviewed in [employer] record.  ROS  The following are not active complaints unless bolded Hoarseness, sore throat, dysphagia, dental problems, itching, sneezing,  nasal congestion or discharge of excess mucus or purulent secretions, ear ache,   fever, chills, sweats, unintended wt loss or wt gain, classically pleuritic or exertional cp,  orthopnea pnd or arm/hand swelling  or leg swelling, presyncope, palpitations, abdominal pain, anorexia, nausea, vomiting, diarrhea  or change in bowel habits or change in bladder habits, change in stools or change in urine, dysuria, hematuria,  rash, arthralgias, visual complaints, headache, numbness, weakness or ataxia or problems with walking/uses cane or coordination,  change in mood or  memory. Tremor         Current Meds  Medication Sig   albuterol (PROVENTIL) (2.5 MG/3ML) 0.083% nebulizer solution Take 3 mLs (2.5 mg total) by nebulization every 4 (four) hours as needed for wheezing or shortness of breath.   albuterol (VENTOLIN HFA) 108 (90 Base) MCG/ACT inhaler Inhale 2 puffs into the lungs every 6 (six) hours as needed for wheezing or shortness of breath.   Budeson-Glycopyrrol-Formoterol (BREZTRI AEROSPHERE) 160-9-4.8 MCG/ACT AERO Inhale 2 puffs into the lungs 2 (two) times daily.   clonazePAM (KLONOPIN) 0.5 MG tablet TAKE 1 TABLET BY MOUTH THREE TIMES DAILY AS NEEDED FOR ANXIETY   esomeprazole (NEXIUM) 40 MG capsule Take 1 capsule (40 mg total) by mouth daily.    guaiFENesin (MUCINEX) 600 MG 12 hr tablet Take 600 mg by mouth 2 (two) times daily as needed for cough or to loosen phlegm.   PARoxetine (PAXIL) 40 MG tablet Take 1 tablet by mouth once daily   propranolol ER (INDERAL LA) 60 MG 24 hr capsule Take 1 capsule by mouth once daily   sucralfate (CARAFATE) 1 g tablet Take 1 tablet (1 g total) by mouth 4 (four) times daily. Please give patient instructions for slurry.   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    Hepatitis C    lung ca dx'd 03/2018   Tremor, unspecified    non-specific; takes inderal.          Objective:     Wts  08/28/2023     160  02/12/2023       156  08/14/2022      150  04/23/2022       156   03/07/2022        156   09/07/21 125 lb (56.7 kg)  06/29/21 120 lb (54.4 kg)  06/07/21 119 lb 11.2 oz (54.3 kg)   Vital signs reviewed  08/28/2023  - Note at rest 02 sats  95% on RA   General appearance:    amb wf nad   HEENT : Oropharynx  clear      NECK :  without  apparent JVD/ palpable Nodes/TM    LUNGS: no acc muscle use,  Mild barrel/kyphotic  contour chest wall with bilateral  Distant bs s audible wheeze and  without cough on insp or exp maneuvers  and mild  Hyperresonant  to  percussion bilaterally     CV:  RRR  no s3 or murmur or increase in P2, and no edema   ABD:  soft and nontender    MS:   ext warm without deformities Or obvious joint restrictions  calf tenderness, cyanosis or clubbing     SKIN: warm and dry without lesions    NEURO:  alert, approp, nl sensorium with  no motor or cerebellar deficits apparent.              Assessment

## 2023-08-28 NOTE — Patient Instructions (Signed)
Prednisone 10 mg take  4 each am x 2 days,   2 each am x 2 days,  1 each am x 2 days and stop  Plan A = Automatic = Always=    Breztri Take 2 puffs first thing in am and then another 2 puffs about 12 hours later.    Work on inhaler technique:  relax and gently blow all the way out then take a nice smooth full deep breath back in, triggering the inhaler at same time you start breathing in.  Hold breath in for at least  5 seconds if you can. Blow out breztri thru nose. Rinse and gargle with water when done.  If mouth or throat bother you at all,  try brushing teeth/gums/tongue with arm and hammer toothpaste/ make a slurry and gargle and spit out.       Plan B = Backup (to supplement plan A, not to replace it) Only use your albuterol inhaler as a rescue medication to be used if you can't catch your breath by resting or doing a relaxed purse lip breathing pattern.  - The less you use it, the better it will work when you need it. - Ok to use the inhaler up to 2 puffs  every 4 hours if you must but call for appointment if use goes up over your usual need - Don't leave home without it !!  (think of it like the spare tire for your car)   Plan C = Crisis (instead of Plan B but only if Plan B stops working) - only use your albuterol nebulizer if you first try Plan B and it fails to help > ok to use the nebulizer up to every 4 hours but if start needing it regularly call for immediate appointment   Also  Ok to try albuterol 15 min before an activity (on alternating days)  that you know would usually make you short of breath and see if it makes any difference and if makes none then don't take albuterol after activity unless you can't catch your breath as this means it's the resting that helps, not the albuterol.       Please schedule a follow up visit in 3 months but call sooner if needed

## 2023-08-28 NOTE — Assessment & Plan Note (Addendum)
Quit smoking 2019/MM - PFT's  12/14/19 FEV1 1.02 (40 % ) ratio 0.39  p 20 % improvement from saba p ? prior to study with DLCO  10.06 (48%) corrects to 2.32 (55%)  for alv volume and FV curve classic concave exp loop - 06/29/2021  After extensive coaching inhaler device,  effectiveness =    90% with dpi, 75% with hfa   - 06/29/2021   Walked on RA x  3  lap(s) =  approx 750 ft @ mod fast pace, stopped due to end of study, sob  with lowest 02 sats 93% - 04/23/2022 rec change propranolol to bisoprolol  - Labs ordered 08/14/2022  :    alpha one AT phenotype  MM  149  - 02/12/2023   continue breztri -  08/28/2023  After extensive coaching inhaler device,  effectiveness =   80% (short ti) > continue breztri  for GOLD Group E COPD  and more approp saba  Re SABA :  I spent extra time with pt today reviewing appropriate use of albuterol for prn use on exertion with the following points: 1) saba is for relief of sob that does not improve by walking a slower pace or resting but rather if the pt does not improve after trying this first. 2) If the pt is convinced, as many are, that saba helps recover from activity faster then it's easy to tell if this is the case by re-challenging : ie stop, take the inhaler, then p 5 minutes try the exact same activity (intensity of workload) that just caused the symptoms and see if they are substantially diminished or not after saba 3) if there is an activity that reproducibly causes the symptoms, try the saba 15 min before the activity on alternate days   If in fact the saba really does help, then fine to continue to use it prn but advised may need to look closer at the maintenance regimen being used to achieve better control of airways disease with exertion.   Ideally should not be on high dose inderol > seeking neurology opinion    >>> rx pred x 6 days for mild aecopd x last 2 week prior to OV  > no abx needed for now       Each maintenance medication was reviewed in detail  including emphasizing most importantly the difference between maintenance and prns and under what circumstances the prns are to be triggered using an action plan format where appropriate.  Total time for H and P, chart review, counseling, reviewing hfa  device(s) and generating customized AVS unique to this office visit / same day charting = 30 min   Add: she is cleared for endoscopy assuming back to baseline resp status p above rx complete and if not will need to return to office for re-eval

## 2023-08-28 NOTE — Telephone Encounter (Signed)
Can you please add risk assessment to your note from today, thanks!

## 2023-09-03 NOTE — Telephone Encounter (Signed)
OV note 08/28/23 was faxed to GI

## 2023-09-08 ENCOUNTER — Other Ambulatory Visit: Payer: Self-pay | Admitting: Adult Health

## 2023-09-08 DIAGNOSIS — F419 Anxiety disorder, unspecified: Secondary | ICD-10-CM

## 2023-09-16 DIAGNOSIS — H43393 Other vitreous opacities, bilateral: Secondary | ICD-10-CM | POA: Diagnosis not present

## 2023-09-24 ENCOUNTER — Ambulatory Visit: Payer: Medicare HMO | Admitting: Adult Health

## 2023-09-24 NOTE — Progress Notes (Deleted)
Subjective:    Patient ID: [name], female    DOB: 01-01-1956, 66 y.o.   MRN: [id number]  HPI Patient presents for yearly preventative medicine examination. She is a pleasant 67 year old female who  has a past medical history of Anxiety, Arthritis of hand (04/17/2017), Bronchitis, Depression, Dyspnea, Emphysema, unspecified (HCC), Family history of adverse reaction to anesthesia, Hepatitis C, lung ca (dx'd 03/2018), and Tremor, unspecified.  COPD/history of lung cancer -stage Ib non-small cell lung cancer, adenocarcinoma diagnosed in June 2019.  She is status post wedge resection of the left upper lobe nodule in addition to lingular segmentectomy in June 2019.  She also has a history of COPD Tigue Gold 3 currently maintained on Breztri.  She also uses an albuterol inhaler as needed and has albuterol nebulizer as needed. She also uses Propanolol 60 mg  ER daily.   Insomnia- managed with Trazodone 100 mg at bedtime    Anxiety and Depression -managed with Paxil 40 mg daily, she also uses Klonopin 0.5 mg for anxiety related to air hunger.    All immunizations and health maintenance protocols were reviewed with the patient and needed orders were placed.  Appropriate screening laboratory values were ordered for the patient including screening of hyperlipidemia, renal function and hepatic function. If indicated by BPH, a PSA was ordered.  Medication reconciliation,  past medical history, social history, problem list and allergies were reviewed in detail with the patient  Goals were established with regard to weight loss, exercise, and  diet in compliance with medications Wt Readings from Last 3 Encounters:  08/28/23 160 lb (72.6 kg)  07/22/23 158 lb 4.8 oz (71.8 kg)  05/07/23 158 lb (71.7 kg)   Review of Systems  Constitutional: Negative.   HENT: Negative.    Eyes: Negative.   Respiratory: Negative.    Cardiovascular: Negative.   Gastrointestinal: Negative.   Endocrine: Negative.    Genitourinary: Negative.   Musculoskeletal: Negative.   Skin: Negative.   Allergic/Immunologic: Negative.   Neurological: Negative.   Hematological: Negative.   Psychiatric/Behavioral: Negative.     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    Hepatitis C    lung ca dx'd 03/2018   Tremor, unspecified    non-specific; takes inderal.     Social History   Socioeconomic History   Marital status: Widowed    Spouse name: Not on file   Number of children: Not on file   Years of education: Not on file   Highest education level: 12th grade  Occupational History   Not on file  Tobacco Use   Smoking status: Former    Current packs/day: 0.00    Average packs/day: 1 pack/day for 40.0 years (40.0 ttl pk-yrs)    Types: Cigarettes    Start date: 12/14/1977    Quit date: 12/14/2017    Years since quitting: 5.7    Passive exposure: Never   Smokeless tobacco: Never  Vaping Use   Vaping status: Never Used  Substance and Sexual Activity   Alcohol use: Yes    Alcohol/week: 7.0 standard drinks of alcohol    Types: 7 Shots of liquor per week  Comment: occasional bourbon   Drug use: Not Currently   Sexual activity: Never  Other Topics Concern   Not on file  Social History Narrative   Not on file   Social Drivers of Health   Financial Resource Strain: Low Risk  (08/15/2022)   Overall Financial Resource Strain (CARDIA)    Difficulty of Paying Living Expenses: Not hard at all  Food Insecurity: No Food Insecurity (08/15/2022)   Hunger Vital Sign    Worried About Running Out of Food in the Last Year: Never true    Ran Out of Food in the Last Year: Never true  Transportation Needs: No Transportation Needs (08/15/2022)   PRAPARE -  Administrator, Civil Service (Medical): No    Lack of Transportation (Non-Medical): No  Physical Activity: Insufficiently Active (08/15/2022)   Exercise Vital Sign    Days of Exercise per Week: 1 day    Minutes of Exercise per Session: 10 min  Stress: No Stress Concern Present (08/15/2022)   [employer] of Occupational Health - Occupational Stress Questionnaire    Feeling of Stress : Not at all  Social Connections: Moderately Isolated (08/15/2022)   Social Connection and Isolation Panel [NHANES]    Frequency of Communication with Friends and Family: Twice a week    Frequency of Social Gatherings with Friends and Family: Once a week    Attends Religious Services: 1 to 4 times per year    Active Member of [employer] or Organizations: No    Attends Banker Meetings: Not on file    Marital Status: Widowed  Intimate Partner Violence: Not At Risk (09/22/2021)   Humiliation, Afraid, Rape, and Kick questionnaire    Fear of Current or Ex-Partner: No    Emotionally Abused: No    Physically Abused: No    Sexually Abused: No    Past Surgical History:  Procedure Laterality Date   ABDOMINAL HYSTERECTOMY  1986   menorrhagia; hx of cryo for abnormal paps   APPENDECTOMY     removed in 1986 per pt   BIOPSY  10/31/2022   Procedure: BIOPSY;  Surgeon: Lemar Lofty., MD;  Location: [facility];  Service: Gastroenterology;;   CATARACT EXTRACTION Bilateral 07/2022   ESOPHAGOGASTRODUODENOSCOPY (EGD) WITH PROPOFOL N/A 10/31/2022   Procedure: ESOPHAGOGASTRODUODENOSCOPY (EGD) WITH PROPOFOL;  Surgeon: Lemar Lofty., MD;  Location: [facility];  Service: Gastroenterology;  Laterality: N/A;   INTERCOSTAL NERVE BLOCK Right 12/18/2019   Procedure: Intercostal Nerve Block;  Surgeon: Loreli Slot, MD;  Location[facility];  Service: Thoracic;  Laterality: Right;   LOBECTOMY Left 03/13/2018   Procedure: Lingula section of left upper lobe lobectomy;  Surgeon:  Loreli Slot, MD;  Location: [facility] OR;  Service: Thoracic;  Laterality: Left;   NODE DISSECTION  12/18/2019   Procedure: Node Dissection;  Surgeon: Loreli Slot, MD;  Location: [facility] OR;  Service: Thoracic;;   SAVORY DILATION N/A 10/31/2022   Procedure: Jacklyn Shell;  Surgeon: Lemar Lofty., MD;  Location: [facility];  Service: Gastroenterology;  Laterality: N/A;   VIDEO ASSISTED THORACOSCOPY (VATS)/WEDGE RESECTION Left 03/13/2018   Procedure: VIDEO ASSISTED THORACOSCOPY (VATS)/WEDGE RESECTION;  Surgeon: Loreli Slot, MD;  Location: [facility] OR;  Service: Thoracic;  Laterality: Left;    Family History  Problem Relation Age of Onset   Asthma Mother    Alzheimer's disease Mother 25   Hypertension Mother    Other Father        shot  Neuropathy Sister        face   Cancer Maternal Aunt        breast   Cancer Paternal Aunt    Lung cancer Paternal Uncle    Cancer Paternal Grandmother        abdominal   Colon cancer Neg Hx    Esophageal cancer Neg Hx    Inflammatory bowel disease Neg Hx    Liver disease Neg Hx    Pancreatic cancer Neg Hx    Rectal cancer Neg Hx    Stomach cancer Neg Hx     Allergies  Allergen Reactions   Aspirin Shortness Of Breath   Trelegy Ellipta [Fluticasone-Umeclidin-Vilant] Shortness Of Breath    She reports that is made her cough worse.    Morphine Sulfate Other (See Comments)    RED STREAKS ON ARMS.   Oxycodone Itching and Rash   Glycopyrrolate Other (See Comments)    UNSPECIFIED REACTION  pt unsure if she is allergic.   Lactose Intolerance (Gi) Nausea And Vomiting    Milk only.Patient states that she can eat cheese.   Penicillins Other (See Comments)     Heartburn, upset stomach only & tolerated AMOXIL after this reaction Has patient had a PCN reaction causing immediate rash, facial/tongue/throat swelling, SOB or lightheadedness with hypotension: No Has patient had a PCN reaction causing severe rash involving mucus  membranes or skin necrosis: No Has patient had a PCN reaction that required hospitalization: No Has patient had a PCN reaction occurring within the last 10 years: #  #  #  YES  #  #  #    Sulfa Antibiotics Tinitus    Current Outpatient Medications on File Prior to Visit  Medication Sig Dispense Refill   albuterol (PROVENTIL) (2.5 MG/3ML) 0.083% nebulizer solution Take 3 mLs (2.5 mg total) by nebulization every 4 (four) hours as needed for wheezing or shortness of breath. 75 mL 12   albuterol (VENTOLIN HFA) 108 (90 Base) MCG/ACT inhaler Inhale 2 puffs into the lungs every 6 (six) hours as needed for wheezing or shortness of breath. 8 g 0   Budeson-Glycopyrrol-Formoterol (BREZTRI AEROSPHERE) 160-9-4.8 MCG/ACT AERO Inhale 2 puffs into the lungs 2 (two) times daily. 3 each 4   clonazePAM (KLONOPIN) 0.5 MG tablet TAKE 1 TABLET BY MOUTH THREE TIMES DAILY AS NEEDED FOR ANXIETY 90 tablet 0   esomeprazole (NEXIUM) 40 MG capsule Take 1 capsule (40 mg total) by mouth daily. 60 capsule 11   guaiFENesin (MUCINEX) 600 MG 12 hr tablet Take 600 mg by mouth 2 (two) times daily as needed for cough or to loosen phlegm.     PARoxetine (PAXIL) 40 MG tablet Take 1 tablet by mouth once daily 90 tablet 0   predniSONE (DELTASONE) 10 MG tablet Take  4 each am x 2 days,   2 each am x 2 days,  1 each am x 2 days and stop 14 tablet 0   propranolol ER (INDERAL LA) 60 MG 24 hr capsule Take 1 capsule by mouth once daily 90 capsule 0   sucralfate (CARAFATE) 1 g tablet Take 1 tablet (1 g total) by mouth 4 (four) times daily. Please give patient instructions for slurry. 60 tablet 2   traZODone (DESYREL) 100 MG tablet Take 1 tablet (100 mg total) by mouth at bedtime as needed. for sleep. Need physical exam for further refills 30 tablet 0   No current facility-administered medications on file prior to visit.    There were  no vitals taken for this visit.      Objective:   Physical Exam Vitals and nursing note reviewed.   Constitutional:      General: She is not in acute distress.    Appearance: Normal appearance. She is not ill-appearing.  HENT:     Head: Normocephalic and atraumatic.     Right Ear: Tympanic membrane, ear canal and external ear normal. There is no impacted cerumen.     Left Ear: Tympanic membrane, ear canal and external ear normal. There is no impacted cerumen.     Nose: Nose normal. No congestion or rhinorrhea.     Mouth/Throat:     Mouth: Mucous membranes are moist.     Pharynx: Oropharynx is clear.  Eyes:     Extraocular Movements: Extraocular movements intact.     Conjunctiva/sclera: Conjunctivae normal.     Pupils: Pupils are equal, round, and reactive to light.  Neck:     Vascular: No carotid bruit.  Cardiovascular:     Rate and Rhythm: Normal rate and regular rhythm.     Pulses: Normal pulses.     Heart sounds: No murmur heard.    No friction rub. No gallop.  Pulmonary:     Effort: Pulmonary effort is normal.     Breath sounds: Normal breath sounds.  Abdominal:     General: Abdomen is flat. Bowel sounds are normal. There is no distension.     Palpations: Abdomen is soft. There is no mass.     Tenderness: There is no abdominal tenderness. There is no guarding or rebound.     Hernia: No hernia is present.  Musculoskeletal:        General: Normal range of motion.     Cervical back: Normal range of motion and neck supple.  Lymphadenopathy:     Cervical: No cervical adenopathy.  Skin:    General: Skin is warm and dry.     Capillary Refill: Capillary refill takes less than 2 seconds.  Neurological:     General: No focal deficit present.     Mental Status: She is alert and oriented to person, place, and time.  Psychiatric:        Mood and Affect: Mood normal.        Behavior: Behavior normal.        Thought Content: Thought content normal.        Judgment: Judgment normal.       Assessment & Plan:

## 2023-09-26 ENCOUNTER — Encounter (HOSPITAL_COMMUNITY): Payer: Self-pay | Admitting: Gastroenterology

## 2023-09-26 NOTE — Progress Notes (Signed)
Attempted to obtain medical history for pre op call via telephone, unable to reach at this time. HIPAA compliant voicemail message left requesting return call to pre surgical testing department.

## 2023-09-27 ENCOUNTER — Ambulatory Visit (INDEPENDENT_AMBULATORY_CARE_PROVIDER_SITE_OTHER): Payer: Medicare HMO | Admitting: Adult Health

## 2023-09-27 ENCOUNTER — Encounter: Payer: Self-pay | Admitting: Adult Health

## 2023-09-27 VITALS — BP 120/60 | HR 71 | Temp 97.6°F | Ht 64.0 in | Wt 161.0 lb

## 2023-09-27 DIAGNOSIS — G43019 Migraine without aura, intractable, without status migrainosus: Secondary | ICD-10-CM

## 2023-09-27 DIAGNOSIS — M79672 Pain in left foot: Secondary | ICD-10-CM | POA: Diagnosis not present

## 2023-09-27 DIAGNOSIS — R300 Dysuria: Secondary | ICD-10-CM | POA: Diagnosis not present

## 2023-09-27 DIAGNOSIS — M79671 Pain in right foot: Secondary | ICD-10-CM

## 2023-09-27 MED ORDER — SUMATRIPTAN SUCCINATE 50 MG PO TABS: 50.0000 mg | ORAL_TABLET | ORAL | 1 refills | Status: DC | PRN

## 2023-09-27 MED ORDER — TOPIRAMATE 25 MG PO TABS: 25.0000 mg | ORAL_TABLET | Freq: Every day | ORAL | 0 refills | Status: DC

## 2023-09-27 NOTE — Progress Notes (Signed)
Subjective:    Patient ID: [name], female    DOB: Aug 16, 1956, 67 y.o.   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  She presents to the office today for multiple issues   Migraine headaches - she has a long history of migraine headaches for which in the past she was on Imitrex many years ago. Over the last years she has been taking Motrin which seemed to do the job until recently when her migraines started getting worse and Ibuprofen is not helping. She is getting multiple migraines a week and will have multiple episodes a day of aura. She will get nauseated but not vomit.   Dysuria - She reports that over the last few months she has been experiencing pain when she starts to pee, decreased urination, decreased stream and incontinence.  Some days she normal urination. She is drinking a lot of iced tea and coffee  She also reports that 6 days ago she went out to [facility] to pick up her medications and wore flip flops. That evening when she got into bed both of her feet were swollen, red, and painful. As the week went on the swelling improved and her feet are no longer read. She still has pain across the tops of her feet. She took motrin for one day and reports that the pain improved to some degree.     Review of Systems  See HPI  Past Medical History:  Diagnosis Date   Anxiety    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    Hepatitis C    lung ca dx'd 03/2018   Tremor, unspecified    non-specific; takes inderal.     Social History    Socioeconomic History   Marital status: Widowed    Spouse name: Not on file   Number of children: Not on file   Years of education: Not on file   Highest education level: 12th grade  Occupational History   Not on file  Tobacco Use   Smoking status: Former    Current packs/day: 0.00    Average packs/day: 1 pack/day for 40.0 years (40.0 ttl pk-yrs)    Types: Cigarettes    Start date: 12/14/1977    Quit date: 12/14/2017    Years since quitting: 5.7    Passive exposure: Never   Smokeless tobacco: Never  Vaping Use   Vaping status: Never Used  Substance and Sexual Activity   Alcohol use: Yes    Alcohol/week: 7.0 standard drinks of alcohol    Types: 7 Shots of liquor per week    Comment: occasional bourbon   Drug use: Not Currently   Sexual activity: Never  Other Topics Concern   Not on file  Social History Narrative   Not on file   Social Drivers of Health   Financial Resource Strain: Low Risk  (08/15/2022)   Overall Financial Resource Strain (CARDIA)    Difficulty of Paying Living Expenses: Not hard at all  Food Insecurity: No Food Insecurity (08/15/2022)   Hunger  Vital Sign    Worried About Programme researcher, broadcasting/film/video in the Last Year: Never true    Ran Out of Food in the Last Year: Never true  Transportation Needs: No Transportation Needs (08/15/2022)   PRAPARE - Administrator, Civil Service (Medical): No    Lack of Transportation (Non-Medical): No  Physical Activity: Insufficiently Active (08/15/2022)   Exercise Vital Sign    Days of Exercise per Week: 1 day    Minutes of Exercise per Session: 10 min  Stress: No Stress Concern Present (08/15/2022)   [employer] of Occupational Health - Occupational Stress Questionnaire    Feeling of Stress : Not at all  Social Connections: Moderately Isolated (08/15/2022)   Social Connection and Isolation Panel [NHANES]    Frequency of Communication with Friends and Family: Twice a week    Frequency of Social  Gatherings with Friends and Family: Once a week    Attends Religious Services: 1 to 4 times per year    Active Member of [employer] or Organizations: No    Attends Banker Meetings: Not on file    Marital Status: Widowed  Intimate Partner Violence: Not At Risk (09/22/2021)   Humiliation, Afraid, Rape, and Kick questionnaire    Fear of Current or Ex-Partner: No    Emotionally Abused: No    Physically Abused: No    Sexually Abused: No    Past Surgical History:  Procedure Laterality Date   ABDOMINAL HYSTERECTOMY  1986   menorrhagia; hx of cryo for abnormal paps   APPENDECTOMY     removed in 1986 per pt   BIOPSY  10/31/2022   Procedure: BIOPSY;  Surgeon: Lemar Lofty., MD;  Location: [facility];  Service: Gastroenterology;;   CATARACT EXTRACTION Bilateral 07/2022   ESOPHAGOGASTRODUODENOSCOPY (EGD) WITH PROPOFOL N/A 10/31/2022   Procedure: ESOPHAGOGASTRODUODENOSCOPY (EGD) WITH PROPOFOL;  Surgeon: Lemar Lofty., MD;  Location: [facility];  Service: Gastroenterology;  Laterality: N/A;   INTERCOSTAL NERVE BLOCK Right 12/18/2019   Procedure: Intercostal Nerve Block;  Surgeon: Loreli Slot, MD;  Location: [facility] OR;  Service: Thoracic;  Laterality: Right;   LOBECTOMY Left 03/13/2018   Procedure: Lingula section of left upper lobe lobectomy;  Surgeon: Loreli Slot, MD;  Location: [facility] OR;  Service: Thoracic;  Laterality: Left;   NODE DISSECTION  12/18/2019   Procedure: Node Dissection;  Surgeon: Loreli Slot, MD;  Location: [facility] OR;  Service: Thoracic;;   SAVORY DILATION N/A 10/31/2022   Procedure: Jacklyn Shell;  Surgeon: Lemar Lofty., MD;  Location: [facility];  Service: Gastroenterology;  Laterality: N/A;   VIDEO ASSISTED THORACOSCOPY (VATS)/WEDGE RESECTION Left 03/13/2018   Procedure: VIDEO ASSISTED THORACOSCOPY (VATS)/WEDGE RESECTION;  Surgeon: Loreli Slot, MD;  Location: [facility] OR;  Service: Thoracic;  Laterality: Left;     Family History  Problem Relation Age of Onset   Asthma Mother    Alzheimer's disease Mother 2   Hypertension Mother    Other Father        shot    Neuropathy Sister        face   Cancer Maternal Aunt        breast   Cancer Paternal Aunt    Lung cancer Paternal Uncle    Cancer Paternal Grandmother        abdominal   Colon cancer Neg Hx    Esophageal cancer Neg Hx    Inflammatory bowel disease Neg Hx    Liver disease Neg  Hx    Pancreatic cancer Neg Hx    Rectal cancer Neg Hx    Stomach cancer Neg Hx     Allergies  Allergen Reactions   Aspirin Shortness Of Breath   Trelegy Ellipta [Fluticasone-Umeclidin-Vilant] Shortness Of Breath    She reports that is made her cough worse.    Morphine Sulfate Other (See Comments)    RED STREAKS ON ARMS.   Oxycodone Itching and Rash   Glycopyrrolate Other (See Comments)    UNSPECIFIED REACTION  pt unsure if she is allergic.   Lactose Intolerance (Gi) Nausea And Vomiting    Milk only.Patient states that she can eat cheese.   Penicillins Other (See Comments)     Heartburn, upset stomach only & tolerated AMOXIL after this reaction Has patient had a PCN reaction causing immediate rash, facial/tongue/throat swelling, SOB or lightheadedness with hypotension: No Has patient had a PCN reaction causing severe rash involving mucus membranes or skin necrosis: No Has patient had a PCN reaction that required hospitalization: No Has patient had a PCN reaction occurring within the last 10 years: #  #  #  YES  #  #  #    Sulfa Antibiotics Tinitus    Current Outpatient Medications on File Prior to Visit  Medication Sig Dispense Refill   albuterol (PROVENTIL) (2.5 MG/3ML) 0.083% nebulizer solution Take 3 mLs (2.5 mg total) by nebulization every 4 (four) hours as needed for wheezing or shortness of breath. 75 mL 12   albuterol (VENTOLIN HFA) 108 (90 Base) MCG/ACT inhaler Inhale 2 puffs into the lungs every 6 (six) hours as needed for wheezing or  shortness of breath. 8 g 0   Budeson-Glycopyrrol-Formoterol (BREZTRI AEROSPHERE) 160-9-4.8 MCG/ACT AERO Inhale 2 puffs into the lungs 2 (two) times daily. 3 each 4   clonazePAM (KLONOPIN) 0.5 MG tablet TAKE 1 TABLET BY MOUTH THREE TIMES DAILY AS NEEDED FOR ANXIETY 90 tablet 0   esomeprazole (NEXIUM) 40 MG capsule Take 1 capsule (40 mg total) by mouth daily. 60 capsule 11   guaiFENesin (MUCINEX) 600 MG 12 hr tablet Take 600 mg by mouth 2 (two) times daily as needed for cough or to loosen phlegm.     PARoxetine (PAXIL) 40 MG tablet Take 1 tablet by mouth once daily 90 tablet 0   predniSONE (DELTASONE) 10 MG tablet Take  4 each am x 2 days,   2 each am x 2 days,  1 each am x 2 days and stop 14 tablet 0   propranolol ER (INDERAL LA) 60 MG 24 hr capsule Take 1 capsule by mouth once daily 90 capsule 0   sucralfate (CARAFATE) 1 g tablet Take 1 tablet (1 g total) by mouth 4 (four) times daily. Please give patient instructions for slurry. 60 tablet 2   traZODone (DESYREL) 100 MG tablet Take 1 tablet (100 mg total) by mouth at bedtime as needed. for sleep. Need physical exam for further refills 30 tablet 0   No current facility-administered medications on file prior to visit.    BP 120/60   Pulse 71   Temp 97.6 F (36.4 C) (Oral)   Ht 5\' 4"  (1.626 m)   Wt 161 lb (73 kg)   SpO2 90%   BMI 27.64 kg/m       Objective:   Physical Exam Vitals and nursing note reviewed.  Constitutional:      Appearance: Normal appearance.  Cardiovascular:     Rate and Rhythm: Normal rate and regular rhythm.  Pulses: Normal pulses.          Dorsalis pedis pulses are 2+ on the right side and 2+ on the left side.       Posterior tibial pulses are 2+ on the right side and 2+ on the left side.     Heart sounds: Normal heart sounds.  Pulmonary:     Effort: Pulmonary effort is normal.     Breath sounds: Normal breath sounds.  Abdominal:     General: Abdomen is flat. Bowel sounds are normal. There is no  distension.     Palpations: Abdomen is soft.     Tenderness: There is no right CVA tenderness or left CVA tenderness.  Musculoskeletal:        General: Normal range of motion.     Right lower leg: No edema.     Left lower leg: No edema.     Right foot: Normal range of motion. No deformity.     Left foot: Normal range of motion. No deformity.  Feet:     Right foot:     Skin integrity: Skin integrity normal.     Left foot:     Skin integrity: Skin integrity normal.  Skin:    General: Skin is warm and dry.     Capillary Refill: Capillary refill takes less than 2 seconds.  Neurological:     General: No focal deficit present.     Mental Status: She is alert and oriented to person, place, and time.  Psychiatric:        Mood and Affect: Mood normal.        Behavior: Behavior normal.        Thought Content: Thought content normal.        Judgment: Judgment normal.       Assessment & Plan:  1. Intractable migraine without aura and without status migrainosus (Primary) - Will start on low dose topamax.  - Follow up in one month for CPE  - topiramate (TOPAMAX) 25 MG tablet; Take 1 tablet (25 mg total) by mouth daily.  Dispense: 30 tablet; Refill: 0 - SUMAtriptan (IMITREX) 50 MG tablet; Take 1 tablet (50 mg total) by mouth every 2 (two) hours as needed for migraine. May repeat in 2 hours if headache persists or recurs.  Dispense: 10 tablet; Refill: 1  2. Dysuria - She could not urinate in the office today. Likely OAB rather than UTI but will check urine and culture.  - Advised stopping caffeine and only drinking water - Can consider referral to Urology  - Urine Culture; Future - Urinalysis; Future  3. Bilateral foot pain - No signs of gout or cellulitis.  - Normal foot exam   Shirline Frees, NP

## 2023-09-27 NOTE — Patient Instructions (Signed)
It was great seeing you today   I am going to send in Imitrex and Topamax for the migraines.   Please follow up in one month for a physical exam

## 2023-09-28 ENCOUNTER — Encounter: Payer: Self-pay | Admitting: Internal Medicine

## 2023-10-01 ENCOUNTER — Other Ambulatory Visit: Payer: Self-pay

## 2023-10-01 ENCOUNTER — Emergency Department (HOSPITAL_COMMUNITY): Payer: Medicare HMO

## 2023-10-01 ENCOUNTER — Inpatient Hospital Stay (HOSPITAL_COMMUNITY)
Admission: EM | Admit: 2023-10-01 | Discharge: 2023-10-04 | DRG: 193 | Disposition: A | Discharge status: Home / Self Care | Payer: Medicare HMO | Attending: Internal Medicine | Admitting: Internal Medicine

## 2023-10-01 ENCOUNTER — Encounter (HOSPITAL_COMMUNITY): Payer: Self-pay | Admitting: Internal Medicine

## 2023-10-01 DIAGNOSIS — J9611 Chronic respiratory failure with hypoxia: Secondary | ICD-10-CM | POA: Diagnosis present

## 2023-10-01 DIAGNOSIS — Z902 Acquired absence of lung [part of]: Secondary | ICD-10-CM

## 2023-10-01 DIAGNOSIS — Z87891 Personal history of nicotine dependence: Secondary | ICD-10-CM

## 2023-10-01 DIAGNOSIS — Z79899 Other long term (current) drug therapy: Secondary | ICD-10-CM

## 2023-10-01 DIAGNOSIS — Z801 Family history of malignant neoplasm of trachea, bronchus and lung: Secondary | ICD-10-CM

## 2023-10-01 DIAGNOSIS — J449 Chronic obstructive pulmonary disease, unspecified: Secondary | ICD-10-CM | POA: Diagnosis present

## 2023-10-01 DIAGNOSIS — Z886 Allergy status to analgesic agent status: Secondary | ICD-10-CM

## 2023-10-01 DIAGNOSIS — F32A Depression, unspecified: Secondary | ICD-10-CM | POA: Diagnosis present

## 2023-10-01 DIAGNOSIS — J441 Chronic obstructive pulmonary disease with (acute) exacerbation: Secondary | ICD-10-CM | POA: Diagnosis present

## 2023-10-01 DIAGNOSIS — Z825 Family history of asthma and other chronic lower respiratory diseases: Secondary | ICD-10-CM

## 2023-10-01 DIAGNOSIS — J440 Chronic obstructive pulmonary disease with acute lower respiratory infection: Secondary | ICD-10-CM | POA: Diagnosis present

## 2023-10-01 DIAGNOSIS — Z803 Family history of malignant neoplasm of breast: Secondary | ICD-10-CM

## 2023-10-01 DIAGNOSIS — Z809 Family history of malignant neoplasm, unspecified: Secondary | ICD-10-CM

## 2023-10-01 DIAGNOSIS — F419 Anxiety disorder, unspecified: Secondary | ICD-10-CM | POA: Diagnosis present

## 2023-10-01 DIAGNOSIS — J9621 Acute and chronic respiratory failure with hypoxia: Secondary | ICD-10-CM | POA: Diagnosis present

## 2023-10-01 DIAGNOSIS — Z820 Family history of epilepsy and other diseases of the nervous system: Secondary | ICD-10-CM

## 2023-10-01 DIAGNOSIS — Z888 Allergy status to other drugs, medicaments and biological substances status: Secondary | ICD-10-CM

## 2023-10-01 DIAGNOSIS — Z882 Allergy status to sulfonamides status: Secondary | ICD-10-CM

## 2023-10-01 DIAGNOSIS — Z85118 Personal history of other malignant neoplasm of bronchus and lung: Secondary | ICD-10-CM

## 2023-10-01 DIAGNOSIS — R064 Hyperventilation: Secondary | ICD-10-CM | POA: Diagnosis not present

## 2023-10-01 DIAGNOSIS — Z1152 Encounter for screening for COVID-19: Secondary | ICD-10-CM

## 2023-10-01 DIAGNOSIS — C3492 Malignant neoplasm of unspecified part of left bronchus or lung: Secondary | ICD-10-CM | POA: Diagnosis present

## 2023-10-01 DIAGNOSIS — J189 Pneumonia, unspecified organism: Secondary | ICD-10-CM

## 2023-10-01 DIAGNOSIS — Z885 Allergy status to narcotic agent status: Secondary | ICD-10-CM

## 2023-10-01 DIAGNOSIS — Z8249 Family history of ischemic heart disease and other diseases of the circulatory system: Secondary | ICD-10-CM

## 2023-10-01 DIAGNOSIS — Z7951 Long term (current) use of inhaled steroids: Secondary | ICD-10-CM

## 2023-10-01 DIAGNOSIS — Z880 Allergy status to penicillin: Secondary | ICD-10-CM

## 2023-10-01 DIAGNOSIS — K219 Gastro-esophageal reflux disease without esophagitis: Secondary | ICD-10-CM | POA: Diagnosis present

## 2023-10-01 DIAGNOSIS — Z9981 Dependence on supplemental oxygen: Secondary | ICD-10-CM

## 2023-10-01 DIAGNOSIS — C3491 Malignant neoplasm of unspecified part of right bronchus or lung: Secondary | ICD-10-CM | POA: Diagnosis present

## 2023-10-01 DIAGNOSIS — E739 Lactose intolerance, unspecified: Secondary | ICD-10-CM | POA: Diagnosis present

## 2023-10-01 HISTORY — DX: Pneumonia, unspecified organism: J18.9

## 2023-10-01 LAB — CBC
HCT: 42.9 % (ref 36.0–46.0)
Hemoglobin: 13.7 g/dL (ref 12.0–15.0)
MCH: 26.4 pg (ref 26.0–34.0)
MCHC: 31.9 g/dL (ref 30.0–36.0)
MCV: 82.7 fL (ref 80.0–100.0)
Platelets: 320 10*3/uL (ref 150–400)
RBC: 5.19 MIL/uL — ABNORMAL HIGH (ref 3.87–5.11)
RDW: 12.9 % (ref 11.5–15.5)
WBC: 9.4 10*3/uL (ref 4.0–10.5)
nRBC: 0 % (ref 0.0–0.2)

## 2023-10-01 LAB — BASIC METABOLIC PANEL
Anion gap: 9 (ref 5–15)
BUN: 17 mg/dL (ref 8–23)
CO2: 20 mmol/L — ABNORMAL LOW (ref 22–32)
Calcium: 8.8 mg/dL — ABNORMAL LOW (ref 8.9–10.3)
Chloride: 99 mmol/L (ref 98–111)
Creatinine, Ser: 0.71 mg/dL (ref 0.44–1.00)
GFR, Estimated: >60 mL/min (ref >60)
Glucose, Bld: 126 mg/dL — ABNORMAL HIGH (ref 70–99)
Potassium: 4.3 mmol/L (ref 3.5–5.1)
Sodium: 128 mmol/L — ABNORMAL LOW (ref 135–145)

## 2023-10-01 LAB — RESP PANEL BY RT-PCR (RSV, FLU A&B, COVID)  RVPGX2
Influenza A by PCR: NEGATIVE
Influenza B by PCR: NEGATIVE
Resp Syncytial Virus by PCR: NEGATIVE
SARS Coronavirus 2 by RT PCR: NEGATIVE

## 2023-10-01 LAB — I-STAT CG4 LACTIC ACID, ED: Lactic Acid, Venous: 1.5 mmol/L (ref 0.5–1.9)

## 2023-10-01 LAB — STREP PNEUMONIAE URINARY ANTIGEN: Strep Pneumo Urinary Antigen: NEGATIVE

## 2023-10-01 LAB — HIV ANTIBODY (ROUTINE TESTING W REFLEX): HIV Screen 4th Generation wRfx: NONREACTIVE

## 2023-10-01 MED ORDER — SODIUM CHLORIDE 0.9% FLUSH
3.0000 mL | Freq: Two times a day (BID) | INTRAVENOUS | Status: DC
  Administered 2023-10-02 – 2023-10-03 (×3): 5 mL via INTRAVENOUS

## 2023-10-01 MED ORDER — SODIUM CHLORIDE 0.9% FLUSH
3.0000 mL | INTRAVENOUS | Status: DC | PRN
Start: 2023-10-01 — End: 2023-10-04

## 2023-10-01 MED ORDER — SODIUM CHLORIDE 0.9 % IV SOLN
500.0000 mg | Freq: Once | INTRAVENOUS | Status: AC
  Administered 2023-10-01: 500 mg via INTRAVENOUS
  Filled 2023-10-01: qty 5

## 2023-10-01 MED ORDER — ALBUTEROL SULFATE (2.5 MG/3ML) 0.083% IN NEBU
2.5000 mg | INHALATION_SOLUTION | RESPIRATORY_TRACT | Status: DC | PRN
  Administered 2023-10-01: 2.5 mg via RESPIRATORY_TRACT
  Filled 2023-10-01: qty 3

## 2023-10-01 MED ORDER — ACETAMINOPHEN 325 MG PO TABS
650.0000 mg | ORAL_TABLET | Freq: Four times a day (QID) | ORAL | Status: DC | PRN
Start: 2023-10-01 — End: 2023-10-04

## 2023-10-01 MED ORDER — SODIUM CHLORIDE 0.9 % IV SOLN: 250.0000 mL | INTRAVENOUS | Status: DC | PRN

## 2023-10-01 MED ORDER — ARFORMOTEROL TARTRATE 15 MCG/2ML IN NEBU
15.0000 ug | INHALATION_SOLUTION | Freq: Two times a day (BID) | RESPIRATORY_TRACT | Status: DC
  Administered 2023-10-02 – 2023-10-04 (×5): 15 ug via RESPIRATORY_TRACT
  Filled 2023-10-01 (×5): qty 2

## 2023-10-01 MED ORDER — SODIUM CHLORIDE 0.9 % IV SOLN
2.0000 g | INTRAVENOUS | Status: DC
  Administered 2023-10-02 – 2023-10-04 (×3): 2 g via INTRAVENOUS
  Filled 2023-10-01 (×3): qty 20

## 2023-10-01 MED ORDER — SODIUM CHLORIDE 0.9% FLUSH: 3.0000 mL | Freq: Two times a day (BID) | INTRAVENOUS | Status: DC

## 2023-10-01 MED ORDER — ACETAMINOPHEN 650 MG RE SUPP: 650.0000 mg | Freq: Four times a day (QID) | RECTAL | Status: DC | PRN

## 2023-10-01 MED ORDER — SODIUM CHLORIDE 0.9% FLUSH: 3.0000 mL | INTRAVENOUS | Status: DC | PRN

## 2023-10-01 MED ORDER — CLONAZEPAM 0.5 MG PO TABS: 0.5000 mg | ORAL_TABLET | Freq: Three times a day (TID) | ORAL | Status: DC | PRN

## 2023-10-01 MED ORDER — ENOXAPARIN SODIUM 40 MG/0.4ML IJ SOSY
40.0000 mg | PREFILLED_SYRINGE | INTRAMUSCULAR | Status: DC
  Administered 2023-10-01 – 2023-10-03 (×3): 40 mg via SUBCUTANEOUS
  Filled 2023-10-01 (×3): qty 0.4

## 2023-10-01 MED ORDER — PANTOPRAZOLE SODIUM 40 MG PO TBEC
80.0000 mg | DELAYED_RELEASE_TABLET | Freq: Every day | ORAL | Status: DC
  Administered 2023-10-02 – 2023-10-04 (×3): 80 mg via ORAL
  Filled 2023-10-01 (×3): qty 2

## 2023-10-01 MED ORDER — BUDESONIDE 0.5 MG/2ML IN SUSP
0.5000 mg | Freq: Two times a day (BID) | RESPIRATORY_TRACT | Status: DC
  Administered 2023-10-01 – 2023-10-04 (×6): 0.5 mg via RESPIRATORY_TRACT
  Filled 2023-10-01 (×6): qty 2

## 2023-10-01 MED ORDER — REVEFENACIN 175 MCG/3ML IN SOLN
175.0000 ug | Freq: Every day | RESPIRATORY_TRACT | Status: DC
  Administered 2023-10-02 – 2023-10-04 (×3): 175 ug via RESPIRATORY_TRACT
  Filled 2023-10-01 (×3): qty 3

## 2023-10-01 MED ORDER — GUAIFENESIN ER 600 MG PO TB12: 600.0000 mg | ORAL_TABLET | Freq: Two times a day (BID) | ORAL | Status: DC | PRN

## 2023-10-01 MED ORDER — TRAZODONE HCL 100 MG PO TABS
100.0000 mg | ORAL_TABLET | Freq: Every evening | ORAL | Status: DC | PRN
  Administered 2023-10-01 – 2023-10-03 (×3): 100 mg via ORAL
  Filled 2023-10-01 (×3): qty 1

## 2023-10-01 MED ORDER — HYDROCODONE-ACETAMINOPHEN 5-325 MG PO TABS
1.0000 | ORAL_TABLET | ORAL | Status: DC | PRN
  Filled 2023-10-01: qty 2

## 2023-10-01 MED ORDER — PAROXETINE HCL 20 MG PO TABS
40.0000 mg | ORAL_TABLET | Freq: Every day | ORAL | Status: DC
  Administered 2023-10-01 – 2023-10-04 (×4): 40 mg via ORAL
  Filled 2023-10-01 (×5): qty 2

## 2023-10-01 MED ORDER — BUDESON-GLYCOPYRROL-FORMOTEROL 160-9-4.8 MCG/ACT IN AERO: 2.0000 | INHALATION_SPRAY | Freq: Two times a day (BID) | RESPIRATORY_TRACT | Status: DC

## 2023-10-01 MED ORDER — ONDANSETRON HCL 4 MG/2ML IJ SOLN: 4.0000 mg | Freq: Four times a day (QID) | INTRAMUSCULAR | Status: DC | PRN

## 2023-10-01 MED ORDER — AZITHROMYCIN 250 MG PO TABS
500.0000 mg | ORAL_TABLET | Freq: Every day | ORAL | Status: DC
  Administered 2023-10-02 – 2023-10-04 (×3): 500 mg via ORAL
  Filled 2023-10-01 (×3): qty 2

## 2023-10-01 MED ORDER — ONDANSETRON HCL 4 MG PO TABS: 4.0000 mg | ORAL_TABLET | Freq: Four times a day (QID) | ORAL | Status: DC | PRN

## 2023-10-01 MED ORDER — PROPRANOLOL HCL ER 60 MG PO CP24
60.0000 mg | ORAL_CAPSULE | Freq: Every day | ORAL | Status: DC
  Administered 2023-10-01 – 2023-10-04 (×4): 60 mg via ORAL
  Filled 2023-10-01 (×4): qty 1

## 2023-10-01 MED ORDER — SODIUM CHLORIDE 0.9 % IV SOLN
2.0000 g | Freq: Once | INTRAVENOUS | Status: AC
  Administered 2023-10-01: 2 g via INTRAVENOUS
  Filled 2023-10-01: qty 20

## 2023-10-01 MED ORDER — SENNOSIDES-DOCUSATE SODIUM 8.6-50 MG PO TABS: 1.0000 | ORAL_TABLET | Freq: Every evening | ORAL | Status: DC | PRN

## 2023-10-01 NOTE — ED Provider Notes (Signed)
 [place] EMERGENCY DEPARTMENT AT [facility] Provider Note   CSN: [id number] Arrival date & time: 10/01/23  0500     History  Chief Complaint  Patient presents with   Shortness of Breath    [name] is a 67 y.o. female.  67 yo F with a chief complaints of shortness of breath.  Going on for couple days and significantly worsened last night.  Patient was unable to sleep due to the difficulty.  9 1 was called this morning.  Found to be hypoxic requiring 2 L of oxygen .  Will be that she has 2 L of oxygen  as needed at home but does not typically need to wear it.  No known sick contacts.  No recent travel.  No chest pain or pressure.  Has had some nausea but no vomiting or diarrhea.   Shortness of Breath      Home Medications Prior to Admission medications   Medication Sig Start Date End Date Taking? Authorizing Provider  albuterol  (PROVENTIL ) (2.5 MG/3ML) 0.083% nebulizer solution Take 3 mLs (2.5 mg total) by nebulization every 4 (four) hours as needed for wheezing or shortness of breath. 07/05/22   Darlean Ozell NOVAK, MD  albuterol  (VENTOLIN  HFA) 108 (90 Base) MCG/ACT inhaler Inhale 2 puffs into the lungs every 6 (six) hours as needed for wheezing or shortness of breath. 05/03/21   Koberlein, Junell C, MD  Budeson-Glycopyrrol-Formoterol  (BREZTRI  AEROSPHERE) 160-9-4.8 MCG/ACT AERO Inhale 2 puffs into the lungs 2 (two) times daily. 04/09/23   Hope Almarie ORN, NP  clonazePAM  (KLONOPIN ) 0.5 MG tablet TAKE 1 TABLET BY MOUTH THREE TIMES DAILY AS NEEDED FOR ANXIETY 07/23/23   Nafziger, Darleene, NP  esomeprazole  (NEXIUM ) 40 MG capsule Take 1 capsule (40 mg total) by mouth daily. 05/07/23   Mansouraty, Aloha Raddle., MD  guaiFENesin  (MUCINEX ) 600 MG 12 hr tablet Take 600 mg by mouth 2 (two) times daily as needed for cough or to loosen phlegm.    [provider]  PARoxetine  (PAXIL ) 40 MG tablet Take 1 tablet by mouth once daily 07/08/23   Nafziger, Darleene, NP  predniSONE   (DELTASONE ) 10 MG tablet Take  4 each am x 2 days,   2 each am x 2 days,  1 each am x 2 days and stop 08/28/23   Wert, Michael B, MD  propranolol  ER (INDERAL  LA) 60 MG 24 hr capsule Take 1 capsule by mouth once daily 07/23/23   Nafziger, Darleene, NP  sucralfate  (CARAFATE ) 1 g tablet Take 1 tablet (1 g total) by mouth 4 (four) times daily. Please give patient instructions for slurry. 10/05/22   Mansouraty, Aloha Raddle., MD  SUMAtriptan  (IMITREX ) 50 MG tablet Take 1 tablet (50 mg total) by mouth every 2 (two) hours as needed for migraine. May repeat in 2 hours if headache persists or recurs. 09/27/23   Nafziger, Darleene, NP  topiramate  (TOPAMAX ) 25 MG tablet Take 1 tablet (25 mg total) by mouth daily. 09/27/23 10/27/23  Nafziger, Darleene, NP  traZODone  (DESYREL ) 100 MG tablet Take 1 tablet (100 mg total) by mouth at bedtime as needed. for sleep. Need physical exam for further refills 09/10/23   Merna Darleene, NP      Allergies    Aspirin, Trelegy ellipta  [fluticasone -umeclidin-vilant], Morphine sulfate, Oxycodone , Glycopyrrolate , Lactose intolerance (gi), Penicillins, and Sulfa antibiotics    Review of Systems   Review of Systems  Respiratory:  Positive for shortness of breath.     Physical Exam Updated Vital Signs BP 97/65 (BP  Location: Right Arm)   Pulse 71   Temp 98.5 F (36.9 C) (Oral)   Resp 17   SpO2 99%  Physical Exam Vitals and nursing note reviewed.  Constitutional:      General: She is not in acute distress.    Appearance: She is well-developed. She is not diaphoretic.  HENT:     Head: Normocephalic and atraumatic.  Eyes:     Pupils: Pupils are equal, round, and reactive to light.  Cardiovascular:     Rate and Rhythm: Normal rate and regular rhythm.     Heart sounds: No murmur heard.    No friction rub. No gallop.  Pulmonary:     Effort: Pulmonary effort is normal.     Breath sounds: No wheezing or rales.     Comments: Mildly diminished breath sounds in all fields. Abdominal:      General: There is no distension.     Palpations: Abdomen is soft.     Tenderness: There is no abdominal tenderness.  Musculoskeletal:        General: No tenderness.     Cervical back: Normal range of motion and neck supple.  Skin:    General: Skin is warm and dry.  Neurological:     Mental Status: She is alert and oriented to person, place, and time.  Psychiatric:        Behavior: Behavior normal.     ED Results / Procedures / Treatments   Labs (all labs ordered are listed, but only abnormal results are displayed) Labs Reviewed  CBC - Abnormal; Notable for the following components:      Result Value   RBC 5.19 (*)    All other components within normal limits  BASIC METABOLIC PANEL - Abnormal; Notable for the following components:   Sodium 128 (*)    CO2 20 (*)    Glucose, Bld 126 (*)    Calcium  8.8 (*)    All other components within normal limits  RESP PANEL BY RT-PCR (RSV, FLU A&B, COVID)  RVPGX2  CULTURE, BLOOD (ROUTINE X 2)  CULTURE, BLOOD (ROUTINE X 2)  I-STAT CG4 LACTIC ACID, ED    EKG EKG Interpretation Date/Time:  Tuesday October 01 2023 05:15:08 EST Ventricular Rate:  83 PR Interval:  172 QRS Duration:  85 QT Interval:  381 QTC Calculation: 448 R Axis:   44  Text Interpretation: Sinus rhythm Low voltage, precordial leads No significant change since last tracing Confirmed by Emil Share [id number]) on 10/01/2023 8:38:15 AM  Radiology DG Chest 2 View Result Date: 10/01/2023 CLINICAL DATA:  Increasing shortness of breath EXAM: CHEST - 2 VIEW COMPARISON:  Chest CT 07/15/2023 FINDINGS: COPD with large lung volumes and emphysema by prior. There is architectural distortion from prior lung surgery on both sides. Vague hazy density over the right mid chest could be pneumonia, no underlying nodule by recent chest CT, area separate from the tree in bud opacity described on CT. No edema, effusion, or pneumothorax. IMPRESSION: 1. Hazy density over the right mid chest,  possible pneumonia. 2. COPD and prior lung surgery. Electronically Signed   By: Dorn Roulette M.D.   On: 10/01/2023 07:14    Procedures Procedures    Medications Ordered in ED Medications  cefTRIAXone  (ROCEPHIN ) 2 g in sodium chloride  0.9 % 100 mL IVPB (has no administration in time range)  azithromycin  (ZITHROMAX ) 500 mg in sodium chloride  0.9 % 250 mL IVPB (has no administration in time range)    ED Course/  Medical Decision Making/ A&P                                 Medical Decision Making Amount and/or Complexity of Data Reviewed Labs: ordered. Radiology: ordered.  Risk Decision regarding hospitalization.    67 yo F with a chief complaints of difficulty breathing.  Going on for the past 48 hours.  Got much worse last night.  Found to have pneumonia on chest x-ray.  No leukocytosis, some signs of dehydration perhaps on metabolic panel with hyponatremia and today mildly low bicarb.  COVID flu and RSV were negative.  Started on community-acquired pneumonia antibiotics.  Will discuss with medicine for admission.  The patients results and plan were reviewed and discussed.   Any x-rays performed were independently reviewed by myself.   Differential diagnosis were considered with the presenting HPI.  Medications  cefTRIAXone  (ROCEPHIN ) 2 g in sodium chloride  0.9 % 100 mL IVPB (has no administration in time range)  azithromycin  (ZITHROMAX ) 500 mg in sodium chloride  0.9 % 250 mL IVPB (has no administration in time range)    Vitals:   10/01/23 0512 10/01/23 0822  BP: (!) 151/75 97/65  Pulse: 84 71  Resp: (!) 24 17  Temp: 99 F (37.2 C) 98.5 F (36.9 C)  TempSrc: Oral Oral  SpO2: 96% 99%    Final diagnoses:  Community acquired pneumonia of right middle lobe of lung    Admission/ observation were discussed with the admitting physician, patient and/or family and they are comfortable with the plan.         Final Clinical Impression(s) / ED Diagnoses Final  diagnoses:  Community acquired pneumonia of right middle lobe of lung    Rx / DC Orders ED Discharge Orders     None         Emil Share, DO 10/01/23 0840

## 2023-10-01 NOTE — H&P (Signed)
 History and Physical    [name]:[id number] DOB: 1956/02/17 DOA: 10/01/2023  PCP: Merna Huxley, Archer  Patient coming from: Home  Chief Complaint: SOB   HPI: [name] is a 67 y.o. female with medical history significant of COPD, adenocarcinoma of lung, anxiety, GERD, migraine who presents with shortness of breath.  She uses 2 L nasal cannula O2 intermittently at baseline, but was found to be hypoxic by EMS and requires 3 L nasal cannula O2 currently.  Also admits to productive cough.  Denies any fevers, chest pain, abdominal pain or nausea or vomiting.  ED Course: Labs overall unremarkable, no leukocytosis.  COVID, influenza, RSV were negative.  Chest x-ray showed acidity over right mid chest, possible pneumonia.  Patient was started on empiric IV antibiotics and admitted to the hospital.  Review of Systems: As per HPI. Otherwise, all other review of systems reviewed and are negative.   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    Hepatitis C    lung ca dx'd 03/2018   Tremor, unspecified    non-specific; takes inderal .     Past Surgical History:  Procedure Laterality Date   ABDOMINAL HYSTERECTOMY  1986   menorrhagia; hx of cryo for abnormal paps   APPENDECTOMY     removed in 1986 per pt   BIOPSY  10/31/2022   Procedure: BIOPSY;  Surgeon: Patricia Aloha Raddle., MD;  Location: [facility];  Service: Gastroenterology;;   CATARACT EXTRACTION Bilateral 07/2022   ESOPHAGOGASTRODUODENOSCOPY (EGD) WITH PROPOFOL  N/A 10/31/2022   Procedure: ESOPHAGOGASTRODUODENOSCOPY (EGD) WITH PROPOFOL ;  Surgeon: Patricia Aloha Raddle., MD;  Location: [facility];  Service: Gastroenterology;  Laterality: N/A;   INTERCOSTAL NERVE BLOCK  Right 12/18/2019   Procedure: Intercostal Nerve Block;  Surgeon: Patricia Elspeth BROCKS, MD;  Location: [facility] OR;  Service: Thoracic;  Laterality: Right;   LOBECTOMY Left 03/13/2018   Procedure: Lingula section of left upper lobe lobectomy;  Surgeon: Patricia Elspeth BROCKS, MD;  Location: [facility] OR;  Service: Thoracic;  Laterality: Left;   NODE DISSECTION  12/18/2019   Procedure: Node Dissection;  Surgeon: Patricia Elspeth BROCKS, MD;  Location: [facility] OR;  Service: Thoracic;;   SAVORY DILATION N/A 10/31/2022   Procedure: HARLEY HODGKIN;  Surgeon: Patricia Aloha Raddle., MD;  Location: [facility];  Service: Gastroenterology;  Laterality: N/A;   VIDEO ASSISTED THORACOSCOPY (VATS)/WEDGE RESECTION Left 03/13/2018   Procedure: VIDEO ASSISTED THORACOSCOPY (VATS)/WEDGE RESECTION;  Surgeon: Patricia Elspeth BROCKS, MD;  Location: [facility] OR;  Service: Thoracic;  Laterality: Left;     reports that she quit smoking about 5 years ago. Her smoking use included cigarettes. She started smoking about 45 years ago. She has a 40 pack-year smoking history. She has never been exposed to tobacco smoke. She has never used smokeless tobacco. She reports current alcohol use of about 7.0 standard drinks of alcohol per week. She reports that she does not currently use drugs.  Allergies  Allergen Reactions   Aspirin Shortness Of Breath   Trelegy Ellipta  [Fluticasone -Umeclidin-Vilant] Shortness Of Breath    She reports that is made her cough worse.    Morphine Sulfate Other (See Comments)    RED STREAKS ON ARMS.  Oxycodone  Itching and Rash   Glycopyrrolate  Other (See Comments)    UNSPECIFIED REACTION  pt unsure if she is allergic.   Lactose Intolerance (Gi) Nausea And Vomiting    Milk only.Patient states that she can eat cheese.   Penicillins Other (See Comments)     Heartburn, upset stomach only & tolerated AMOXIL  after this reaction   Sulfa Antibiotics Tinitus    Family History  Problem Relation Age of Onset   Asthma Mother     Alzheimer's disease Mother 49   Hypertension Mother    Other Father        shot    Neuropathy Sister        face   Cancer Maternal Aunt        breast   Cancer Paternal Aunt    Lung cancer Paternal Uncle    Cancer Paternal Grandmother        abdominal   Colon cancer Neg Hx    Esophageal cancer Neg Hx    Inflammatory bowel disease Neg Hx    Liver disease Neg Hx    Pancreatic cancer Neg Hx    Rectal cancer Neg Hx    Stomach cancer Neg Hx     Prior to Admission medications   Medication Sig Start Date End Date Taking? Authorizing Provider  albuterol  (PROVENTIL ) (2.5 MG/3ML) 0.083% nebulizer solution Take 3 mLs (2.5 mg total) by nebulization every 4 (four) hours as needed for wheezing or shortness of breath. 07/05/22   Darlean Ozell NOVAK, MD  albuterol  (VENTOLIN  HFA) 108 (90 Base) MCG/ACT inhaler Inhale 2 puffs into the lungs every 6 (six) hours as needed for wheezing or shortness of breath. 05/03/21   Koberlein, Junell C, MD  Budeson-Glycopyrrol-Formoterol  (BREZTRI  AEROSPHERE) 160-9-4.8 MCG/ACT AERO Inhale 2 puffs into the lungs 2 (two) times daily. 04/09/23   Patricia Almarie ORN, Archer  clonazePAM  (KLONOPIN ) 0.5 MG tablet TAKE 1 TABLET BY MOUTH THREE TIMES DAILY AS NEEDED FOR ANXIETY 07/23/23   [name]  esomeprazole  (NEXIUM ) 40 MG capsule Take 1 capsule (40 mg total) by mouth daily. 05/07/23   Mansouraty, Aloha Raddle., MD  guaiFENesin  (MUCINEX ) 600 MG 12 hr tablet Take 600 mg by mouth 2 (two) times daily as needed for cough or to loosen phlegm.    [provider]  PARoxetine  (PAXIL ) 40 MG tablet Take 1 tablet by mouth once daily 07/08/23   [name]  predniSONE  (DELTASONE ) 10 MG tablet Take  4 each am x 2 days,   2 each am x 2 days,  1 each am x 2 days and stop 08/28/23   Darlean Ozell NOVAK, MD  propranolol  ER (INDERAL  LA) 60 MG 24 hr capsule Take 1 capsule by mouth once daily 07/23/23   [name]  sucralfate  (CARAFATE ) 1 g tablet Take 1 tablet (1 g total) by mouth  4 (four) times daily. Please give patient instructions for slurry. 10/05/22   Mansouraty, Gabriel Jr., MD  SUMAtriptan  (IMITREX ) 50 MG tablet Take 1 tablet (50 mg total) by mouth every 2 (two) hours as needed for migraine. May repeat in 2 hours if headache persists or recurs. 09/27/23   [name]  topiramate  (TOPAMAX ) 25 MG tablet Take 1 tablet (25 mg total) by mouth daily. 09/27/23 10/27/23  [name]  traZODone  (DESYREL ) 100 MG tablet Take 1 tablet (100 mg total) by mouth at bedtime as needed. for sleep. Need physical exam for further refills 09/10/23   Merna Darleene, Archer  Physical Exam: Vitals:   10/01/23 1018 10/01/23 1104 10/01/23 1502 10/01/23 1519  BP: 106/71  112/66 121/70  Pulse: 71  70 75  Resp: 20  17 18   Temp: 98.1 F (36.7 C)  98.1 F (36.7 C) 98.1 F (36.7 C)  TempSrc:   Oral Oral  SpO2: 99%  98% 100%  Weight:  72.6 kg    Height:  5' 4 (1.626 m)      Constitutional: NAD, calm, comfortable Eyes: PERRL, lids and conjunctivae normal ENMT: Mucous membranes are moist. Normal dentition.  Respiratory: Clear to auscultation bilaterally, no wheezing, no crackles. Normal respiratory effort. No accessory muscle use. No conversational dyspnea.  On 3 L oxygen  Cardiovascular: Regular rate and rhythm, no murmurs. No extremity edema.  Abdomen: Soft, nondistended, nontender to palpation. Bowel sounds positive.  Musculoskeletal: No joint deformity upper and lower extremities. No contractures. Normal muscle tone.  Skin: no rashes, lesions, ulcers on exposed skin  Neurologic: Alert and oriented, speech fluent, CN 2-12 grossly intact. No focal deficits.   Psychiatric: Normal judgment and insight. Normal mood and affect   Labs on Admission: I have personally reviewed following labs and imaging studies  CBC: Recent Labs  Lab 10/01/23 0515  WBC 9.4  HGB 13.7  HCT 42.9  MCV 82.7  PLT 320   Basic Metabolic Panel: Recent Labs  Lab 10/01/23 0650  NA 128*  K 4.3   CL 99  CO2 20*  GLUCOSE 126*  BUN 17  CREATININE 0.71  CALCIUM  8.8*   GFR: Estimated Creatinine Clearance: 65.8 mL/min (by C-G formula based on SCr of 0.71 mg/dL). Liver Function Tests: No results for input(s): AST, ALT, ALKPHOS, BILITOT, PROT, ALBUMIN  in the last 168 hours. No results for input(s): LIPASE, AMYLASE in the last 168 hours. No results for input(s): AMMONIA in the last 168 hours. Coagulation Profile: No results for input(s): INR, PROTIME in the last 168 hours. Cardiac Enzymes: No results for input(s): CKTOTAL, CKMB, CKMBINDEX, TROPONINI in the last 168 hours. BNP (last 3 results) No results for input(s): PROBNP in the last 8760 hours. HbA1C: No results for input(s): HGBA1C in the last 72 hours. CBG: No results for input(s): GLUCAP in the last 168 hours. Lipid Profile: No results for input(s): CHOL, HDL, LDLCALC, TRIG, CHOLHDL, LDLDIRECT in the last 72 hours. Thyroid  Function Tests: No results for input(s): TSH, T4TOTAL, FREET4, T3FREE, THYROIDAB in the last 72 hours. Anemia Panel: No results for input(s): VITAMINB12, FOLATE, FERRITIN, TIBC, IRON, RETICCTPCT in the last 72 hours. Urine analysis:    Component Value Date/Time   COLORURINE YELLOW 12/14/2019 0839   APPEARANCEUR CLEAR 12/14/2019 0839   LABSPEC 1.013 12/14/2019 0839   PHURINE 5.0 12/14/2019 0839   GLUCOSEU NEGATIVE 12/14/2019 0839   HGBUR NEGATIVE 12/14/2019 0839   BILIRUBINUR NEGATIVE 12/14/2019 0839   BILIRUBINUR n 04/30/2014 1109   KETONESUR NEGATIVE 12/14/2019 0839   PROTEINUR NEGATIVE 12/14/2019 0839   UROBILINOGEN 0.2 04/30/2014 1109   NITRITE NEGATIVE 12/14/2019 0839   LEUKOCYTESUR NEGATIVE 12/14/2019 0839   Sepsis Labs: !!!!!!!!!!!!!!!!!!!!!!!!!!!!!!!!!!!!!!!!!!!! @LABRCNTIP (procalcitonin:4,lacticidven:4) ) Recent Results (from the past 240 hours)  Resp panel by RT-PCR (RSV, Flu A&B, Covid) Anterior Nasal Swab      Status: None   Collection Time: 10/01/23  5:15 AM   Specimen: Anterior Nasal Swab  Result Value Ref Range Status   SARS Coronavirus 2 by RT PCR NEGATIVE NEGATIVE Final    Comment: (NOTE) SARS-CoV-2 target nucleic acids are NOT DETECTED.  The SARS-CoV-2 RNA is generally detectable  in upper respiratory specimens during the acute phase of infection. The lowest concentration of SARS-CoV-2 viral copies this assay can detect is 138 copies/mL. A negative result does not preclude SARS-Cov-2 infection and should not be used as the sole basis for treatment or other patient management decisions. A negative result may occur with  improper specimen collection/handling, submission of specimen other than nasopharyngeal swab, presence of viral mutation(s) within the areas targeted by this assay, and inadequate number of viral copies(<138 copies/mL). A negative result must be combined with clinical observations, patient history, and epidemiological information. The expected result is Negative.  Fact Sheet for Patients:  bloggercourse.com  Fact Sheet for Healthcare Providers:  seriousbroker.it  This test is no t yet approved or cleared by the [place]  FDA and  has been authorized for detection and/or diagnosis of SARS-CoV-2 by FDA under an Emergency Use Authorization (EUA). This EUA will remain  in effect (meaning this test can be used) for the duration of the COVID-19 declaration under Section 564(b)(1) of the Act, 21 U.S.C.section 360bbb-3(b)(1), unless the authorization is terminated  or revoked sooner.       Influenza A by PCR NEGATIVE NEGATIVE Final   Influenza B by PCR NEGATIVE NEGATIVE Final    Comment: (NOTE) The Xpert Xpress SARS-CoV-2/FLU/RSV plus assay is intended as an aid in the diagnosis of influenza from Nasopharyngeal swab specimens and should not be used as a sole basis for treatment. Nasal washings and aspirates are  unacceptable for Xpert Xpress SARS-CoV-2/FLU/RSV testing.  Fact Sheet for Patients: bloggercourse.com  Fact Sheet for Healthcare Providers: seriousbroker.it  This test is not yet approved or cleared by the [place]  FDA and has been authorized for detection and/or diagnosis of SARS-CoV-2 by FDA under an Emergency Use Authorization (EUA). This EUA will remain in effect (meaning this test can be used) for the duration of the COVID-19 declaration under Section 564(b)(1) of the Act, 21 U.S.C. section 360bbb-3(b)(1), unless the authorization is terminated or revoked.     Resp Syncytial Virus by PCR NEGATIVE NEGATIVE Final    Comment: (NOTE) Fact Sheet for Patients: bloggercourse.com  Fact Sheet for Healthcare Providers: seriousbroker.it  This test is not yet approved or cleared by the [place]  FDA and has been authorized for detection and/or diagnosis of SARS-CoV-2 by FDA under an Emergency Use Authorization (EUA). This EUA will remain in effect (meaning this test can be used) for the duration of the COVID-19 declaration under Section 564(b)(1) of the Act, 21 U.S.C. section 360bbb-3(b)(1), unless the authorization is terminated or revoked.  Performed at [facility], [address] [place], [place] [address]      Radiological Exams on Admission: DG Chest 2 View Result Date: 10/01/2023 CLINICAL DATA:  Increasing shortness of breath EXAM: CHEST - 2 VIEW COMPARISON:  Chest CT 07/15/2023 FINDINGS: COPD with large lung volumes and emphysema by prior. There is architectural distortion from prior lung surgery on both sides. Vague hazy density over the right mid chest could be pneumonia, no underlying nodule by recent chest CT, area separate from the tree in bud opacity described on CT. No edema, effusion, or pneumothorax. IMPRESSION: 1. Hazy density over the  right mid chest, possible pneumonia. 2. COPD and prior lung surgery. Electronically Signed   By: Dorn Roulette M.D.   On: 10/01/2023 07:14    EKG: Independently reviewed.  Normal sinus rhythm  Assessment/Plan Principal Problem:   CAP (community acquired pneumonia) Active Problems:   GERD   Adenocarcinoma of  left lung, stage 1 (HCC)   S/P partial lobectomy of lung   Adenocarcinoma of right lung, stage 1 (HCC)   COPD GOLD 3    Anxiety   Chronic respiratory failure with hypoxia (HCC)   Acute on chronic hypoxic respiratory failure with CAP -COVID, RSV, influenza negative -Check strep pneumo, Legionella antigen -Rocephin /Azithromycin  -Currently on 3L [place] O2, uses 2L PRN at baseline   History of lung cancer.  COPD -Followed by Dr. Darlean, pulmonology  Anxiety -Klonopin , Paxil   GERD -PPI    DVT prophylaxis:  enoxaparin  (LOVENOX ) injection 40 mg Start: 10/01/23 1630  Code Status: Full, discussed with patient at time of admit  Family Communication: None at bedside  Disposition Plan: Home  Consults called: None     Status is: Observation The patient remains OBS appropriate and will d/c before 2 midnights.   Severity of Illness: The appropriate patient status for this patient is OBSERVATION. Observation status is judged to be reasonable and necessary in order to provide the required intensity of service to ensure the patient's safety. The patient's presenting symptoms, physical exam findings, and initial radiographic and laboratory data in the context of their medical condition is felt to place them at decreased risk for further clinical deterioration. Furthermore, it is anticipated that the patient will be medically stable for discharge from the hospital within 2 midnights of admission.   Delon Hoe, DO [facility] 10/01/2023, 3:45 PM   Available via Epic secure chat 7am-7pm After these hours, please refer to coverage provider listed on [email or web address]

## 2023-10-01 NOTE — ED Triage Notes (Signed)
Pt BIB [facility] for increased SOB since 2200. Pt was found by EMS to have room air O2 sats of 88%, but this improved to 97% with 2L of O2 nasal cannula. Pt denies CP.

## 2023-10-01 NOTE — Telephone Encounter (Signed)
 I called the [facility] in [place] and spoke with B[place]  She states that the rx we sent 08/28/23 for pred taper was received by them  They held it for 10 days and bc the pt never picked up they put back  I called and spoke with the pt and let her know this  She states she assumed bc she never received msg from pharm online that the med was never sent  She is currently at ED being evaluated  Nothing further needed

## 2023-10-01 NOTE — Telephone Encounter (Signed)
 PT upset and wants to speak to Dr. Darlean. States on the day of her appt 11/27, pred was supposed to be called in and it was not rec'd at her [facility]. She states she spoke to a nurse and was told they would resend it but she states they never did. She went to the ER by ambulance last night and wonders if that Pred would have prevented this. Pls call @ [phone]

## 2023-10-02 ENCOUNTER — Encounter: Payer: Self-pay | Admitting: Gastroenterology

## 2023-10-02 DIAGNOSIS — J189 Pneumonia, unspecified organism: Secondary | ICD-10-CM | POA: Diagnosis not present

## 2023-10-02 LAB — BASIC METABOLIC PANEL
Anion gap: 8 (ref 5–15)
BUN: 15 mg/dL (ref 8–23)
CO2: 24 mmol/L (ref 22–32)
Calcium: 8.8 mg/dL — ABNORMAL LOW (ref 8.9–10.3)
Chloride: 104 mmol/L (ref 98–111)
Creatinine, Ser: 0.69 mg/dL (ref 0.44–1.00)
GFR, Estimated: >60 mL/min (ref >60)
Glucose, Bld: 120 mg/dL — ABNORMAL HIGH (ref 70–99)
Potassium: 4 mmol/L (ref 3.5–5.1)
Sodium: 136 mmol/L (ref 135–145)

## 2023-10-02 LAB — CBC
HCT: 36.3 % (ref 36.0–46.0)
Hemoglobin: 11.4 g/dL — ABNORMAL LOW (ref 12.0–15.0)
MCH: 26.3 pg (ref 26.0–34.0)
MCHC: 31.4 g/dL (ref 30.0–36.0)
MCV: 83.8 fL (ref 80.0–100.0)
Platelets: 255 10*3/uL (ref 150–400)
RBC: 4.33 MIL/uL (ref 3.87–5.11)
RDW: 13 % (ref 11.5–15.5)
WBC: 5.3 10*3/uL (ref 4.0–10.5)
nRBC: 0 % (ref 0.0–0.2)

## 2023-10-02 LAB — EXPECTORATED SPUTUM ASSESSMENT W GRAM STAIN, RFLX TO RESP C

## 2023-10-02 LAB — LEGIONELLA PNEUMOPHILA SEROGP 1 UR AG: L. pneumophila Serogp 1 Ur Ag: NEGATIVE

## 2023-10-02 MED ORDER — METHYLPREDNISOLONE SODIUM SUCC 40 MG IJ SOLR
40.0000 mg | Freq: Two times a day (BID) | INTRAMUSCULAR | Status: DC
  Administered 2023-10-02 – 2023-10-03 (×3): 40 mg via INTRAVENOUS
  Filled 2023-10-02 (×3): qty 1

## 2023-10-02 NOTE — Progress Notes (Signed)
 PROGRESS NOTE  [name] FMW:[id number] DOB: 1956/02/22 DOA: 10/01/2023 PCP: Merna Huxley, NP  HPI/Recap of past 24 hours: [name] is a 68 y.o. female with medical history significant of COPD, adenocarcinoma of lung, anxiety, GERD, migraine who presents with shortness of breath.  She uses 2 L nasal cannula O2 intermittently at baseline, but was found to be hypoxic by EMS and requires 3 L nasal cannula O2 currently.  Also admits to productive cough.  Denies any fevers, chest pain, abdominal pain or nausea or vomiting. In the ED, labs overall unremarkable, no leukocytosis.  COVID, influenza, RSV were negative.  Chest x-ray showed opacity over right mid chest, possible pneumonia.  Patient was started on empiric IV antibiotics and admitted to the hospital.   Today, still SOB with noted audible wheezing.    Assessment/Plan: Principal Problem:   CAP (community acquired pneumonia) Active Problems:   GERD   Adenocarcinoma of left lung, stage 1 (HCC)   S/P partial lobectomy of lung   Adenocarcinoma of right lung, stage 1 (HCC)   COPD GOLD 3    Anxiety   Chronic respiratory failure with hypoxia (HCC)   Acute on chronic hypoxic respiratory failure with CAP Currently on 2L of O2 COVID, RSV, influenza negative Strep pneumo neg, Legionella antigen pending Rocephin /Azithromycin  Supplemental O2 as needed   Possible COPD exacerbation Continue Brovana , Pulmicort , Yupelri  Start IV Solu-Medrol  Supplemental O2 Followed by Dr. Darlean, pulmonology   Anxiety Klonopin , Paxil    GERD PPI   History of lung cancer    Estimated body mass index is 27.46 kg/m as calculated from the following:   Height as of this encounter: 5' 4 (1.626 m).   Weight as of this encounter: 72.6 kg.     Code Status: Full  Family Communication: None at bedside  Disposition Plan: Status is: Observation The patient will require care spanning > 2 midnights and should be moved to inpatient because:  Level of care      Consultants: None  Procedures: None  Antimicrobials: Ceftriaxone  Azithromycin   DVT prophylaxis: Lovenox    Objective: Vitals:   10/02/23 0136 10/02/23 0457 10/02/23 0827 10/02/23 1018  BP: (!) 119/56 (!) 106/46 (!) 128/54   Pulse: 70 71 66   Resp: 20 20    Temp: 98.5 F (36.9 C) 98.9 F (37.2 C)    TempSrc: Oral Oral    SpO2: 97% 99%  97%  Weight:      Height:        Intake/Output Summary (Last 24 hours) at 10/02/2023 1303 Last data filed at 10/02/2023 0819 Gross per 24 hour  Intake 885 ml  Output --  Net 885 ml   Filed Weights   10/01/23 1104  Weight: 72.6 kg    Exam: General: NAD, chronically ill-appearing Cardiovascular: S1, S2 present Respiratory: Diminished breath sounds bilaterally, with wheezing Abdomen: Soft, nontender, nondistended, bowel sounds present Musculoskeletal: No bilateral pedal edema noted Skin: Normal Psychiatry: Normal mood     Data Reviewed: CBC: Recent Labs  Lab 10/01/23 0515 10/02/23 0442  WBC 9.4 5.3  HGB 13.7 11.4*  HCT 42.9 36.3  MCV 82.7 83.8  PLT 320 255   Basic Metabolic Panel: Recent Labs  Lab 10/01/23 0650 10/02/23 0442  NA 128* 136  K 4.3 4.0  CL 99 104  CO2 20* 24  GLUCOSE 126* 120*  BUN 17 15  CREATININE 0.71 0.69  CALCIUM  8.8* 8.8*   GFR: Estimated Creatinine Clearance: 65.8 mL/min (by C-G formula based on SCr of  0.69 mg/dL). Liver Function Tests: No results for input(s): AST, ALT, ALKPHOS, BILITOT, PROT, ALBUMIN  in the last 168 hours. No results for input(s): LIPASE, AMYLASE in the last 168 hours. No results for input(s): AMMONIA in the last 168 hours. Coagulation Profile: No results for input(s): INR, PROTIME in the last 168 hours. Cardiac Enzymes: No results for input(s): CKTOTAL, CKMB, CKMBINDEX, TROPONINI in the last 168 hours. BNP (last 3 results) No results for input(s): PROBNP in the last 8760 hours. HbA1C: No results for input(s):  HGBA1C in the last 72 hours. CBG: No results for input(s): GLUCAP in the last 168 hours. Lipid Profile: No results for input(s): CHOL, HDL, LDLCALC, TRIG, CHOLHDL, LDLDIRECT in the last 72 hours. Thyroid  Function Tests: No results for input(s): TSH, T4TOTAL, FREET4, T3FREE, THYROIDAB in the last 72 hours. Anemia Panel: No results for input(s): VITAMINB12, FOLATE, FERRITIN, TIBC, IRON, RETICCTPCT in the last 72 hours. Urine analysis:    Component Value Date/Time   COLORURINE YELLOW 12/14/2019 0839   APPEARANCEUR CLEAR 12/14/2019 0839   LABSPEC 1.013 12/14/2019 0839   PHURINE 5.0 12/14/2019 0839   GLUCOSEU NEGATIVE 12/14/2019 0839   HGBUR NEGATIVE 12/14/2019 0839   BILIRUBINUR NEGATIVE 12/14/2019 0839   BILIRUBINUR n 04/30/2014 1109   KETONESUR NEGATIVE 12/14/2019 0839   PROTEINUR NEGATIVE 12/14/2019 0839   UROBILINOGEN 0.2 04/30/2014 1109   NITRITE NEGATIVE 12/14/2019 0839   LEUKOCYTESUR NEGATIVE 12/14/2019 0839   Sepsis Labs: @LABRCNTIP (procalcitonin:4,lacticidven:4)  ) Recent Results (from the past 240 hours)  Resp panel by RT-PCR (RSV, Flu A&B, Covid) Anterior Nasal Swab     Status: None   Collection Time: 10/01/23  5:15 AM   Specimen: Anterior Nasal Swab  Result Value Ref Range Status   SARS Coronavirus 2 by RT PCR NEGATIVE NEGATIVE Final    Comment: (NOTE) SARS-CoV-2 target nucleic acids are NOT DETECTED.  The SARS-CoV-2 RNA is generally detectable in upper respiratory specimens during the acute phase of infection. The lowest concentration of SARS-CoV-2 viral copies this assay can detect is 138 copies/mL. A negative result does not preclude SARS-Cov-2 infection and should not be used as the sole basis for treatment or other patient management decisions. A negative result may occur with  improper specimen collection/handling, submission of specimen other than nasopharyngeal swab, presence of viral mutation(s) within the areas  targeted by this assay, and inadequate number of viral copies(<138 copies/mL). A negative result must be combined with clinical observations, patient history, and epidemiological information. The expected result is Negative.  Fact Sheet for Patients:  bloggercourse.com  Fact Sheet for Healthcare Providers:  seriousbroker.it  This test is no t yet approved or cleared by the [place]  FDA and  has been authorized for detection and/or diagnosis of SARS-CoV-2 by FDA under an Emergency Use Authorization (EUA). This EUA will remain  in effect (meaning this test can be used) for the duration of the COVID-19 declaration under Section 564(b)(1) of the Act, 21 U.S.C.section 360bbb-3(b)(1), unless the authorization is terminated  or revoked sooner.       Influenza A by PCR NEGATIVE NEGATIVE Final   Influenza B by PCR NEGATIVE NEGATIVE Final    Comment: (NOTE) The Xpert Xpress SARS-CoV-2/FLU/RSV plus assay is intended as an aid in the diagnosis of influenza from Nasopharyngeal swab specimens and should not be used as a sole basis for treatment. Nasal washings and aspirates are unacceptable for Xpert Xpress SARS-CoV-2/FLU/RSV testing.  Fact Sheet for Patients: bloggercourse.com  Fact Sheet for Healthcare Providers: seriousbroker.it  This test  is not yet approved or cleared by the [place]  FDA and has been authorized for detection and/or diagnosis of SARS-CoV-2 by FDA under an Emergency Use Authorization (EUA). This EUA will remain in effect (meaning this test can be used) for the duration of the COVID-19 declaration under Section 564(b)(1) of the Act, 21 U.S.C. section 360bbb-3(b)(1), unless the authorization is terminated or revoked.     Resp Syncytial Virus by PCR NEGATIVE NEGATIVE Final    Comment: (NOTE) Fact Sheet for  Patients: bloggercourse.com  Fact Sheet for Healthcare Providers: seriousbroker.it  This test is not yet approved or cleared by the [place]  FDA and has been authorized for detection and/or diagnosis of SARS-CoV-2 by FDA under an Emergency Use Authorization (EUA). This EUA will remain in effect (meaning this test can be used) for the duration of the COVID-19 declaration under Section 564(b)(1) of the Act, 21 U.S.C. section 360bbb-3(b)(1), unless the authorization is terminated or revoked.  Performed at [facility], [address], [place], [place] [address]   Blood culture (routine x 2)     Status: None (Preliminary result)   Collection Time: 10/01/23  9:57 AM   Specimen: BLOOD  Result Value Ref Range Status   Specimen Description   Final    BLOOD BLOOD LEFT ARM Performed at [facility], [address] [place], [place] [address]    Special Requests   Final    BOTTLES DRAWN AEROBIC AND ANAEROBIC Blood Culture results may not be optimal due to an inadequate volume of blood received in culture bottles Performed at [facility], [address], [place], [place] [address]    Culture   Final    NO GROWTH < 24 HOURS Performed at [facility], [address] [place], [place] [address]    Report Status PENDING  Incomplete  Blood culture (routine x 2)     Status: None (Preliminary result)   Collection Time: 10/01/23  9:57 AM   Specimen: BLOOD  Result Value Ref Range Status   Specimen Description   Final    BLOOD BLOOD RIGHT ARM Performed at [facility], [address], [place], [place] [address]    Special Requests   Final    BOTTLES DRAWN AEROBIC AND ANAEROBIC Blood Culture results may not be optimal due to an inadequate volume of blood received in culture bottles Performed at [facility], [address], [place],  [place] [address]    Culture   Final    NO GROWTH < 24 HOURS Performed at [facility], [address], [place], [place] [address]    Report Status PENDING  Incomplete  Expectorated Sputum Assessment w Gram Stain, Rflx to Resp Cult     Status: None   Collection Time: 10/01/23 10:39 AM   Specimen: Sputum  Result Value Ref Range Status   Specimen Description SPUTUM  Final   Special Requests NONE  Final   Sputum evaluation   Final    THIS SPECIMEN IS ACCEPTABLE FOR SPUTUM CULTURE Performed at [facility], [address] [place], [place] [address]    Report Status 10/02/2023 FINAL  Final      Studies: No results found.  Scheduled Meds:  arformoterol   15 mcg Nebulization BID   azithromycin   500 mg Oral Daily   budesonide  (PULMICORT ) nebulizer solution  0.5 mg Nebulization BID   enoxaparin  (LOVENOX ) injection  40 mg Subcutaneous Q24H  methylPREDNISolone  (SOLU-MEDROL ) injection  40 mg Intravenous Q12H   pantoprazole   80 mg Oral Q1200   PARoxetine   40 mg Oral Daily   propranolol  ER  60 mg Oral Daily   revefenacin   175 mcg Nebulization Daily   sodium chloride  flush  3-10 mL Intravenous Q12H    Continuous Infusions:  cefTRIAXone  (ROCEPHIN )  IV 2 g (10/02/23 0826)     LOS: 0 days     Lebron JINNY Cage, MD [facility]  If 7PM-7AM, please contact night-coverage [email or web address] 10/02/2023, 1:03 PM

## 2023-10-03 ENCOUNTER — Telehealth: Payer: Self-pay | Admitting: Gastroenterology

## 2023-10-03 DIAGNOSIS — Z888 Allergy status to other drugs, medicaments and biological substances status: Secondary | ICD-10-CM | POA: Diagnosis not present

## 2023-10-03 DIAGNOSIS — J189 Pneumonia, unspecified organism: Secondary | ICD-10-CM

## 2023-10-03 DIAGNOSIS — Z882 Allergy status to sulfonamides status: Secondary | ICD-10-CM | POA: Diagnosis not present

## 2023-10-03 DIAGNOSIS — Z79899 Other long term (current) drug therapy: Secondary | ICD-10-CM | POA: Diagnosis not present

## 2023-10-03 DIAGNOSIS — Z803 Family history of malignant neoplasm of breast: Secondary | ICD-10-CM | POA: Diagnosis not present

## 2023-10-03 DIAGNOSIS — Z87891 Personal history of nicotine dependence: Secondary | ICD-10-CM | POA: Diagnosis not present

## 2023-10-03 DIAGNOSIS — Z880 Allergy status to penicillin: Secondary | ICD-10-CM | POA: Diagnosis not present

## 2023-10-03 DIAGNOSIS — Z902 Acquired absence of lung [part of]: Secondary | ICD-10-CM | POA: Diagnosis not present

## 2023-10-03 DIAGNOSIS — F32A Depression, unspecified: Secondary | ICD-10-CM | POA: Diagnosis not present

## 2023-10-03 DIAGNOSIS — Z820 Family history of epilepsy and other diseases of the nervous system: Secondary | ICD-10-CM | POA: Diagnosis not present

## 2023-10-03 DIAGNOSIS — Z825 Family history of asthma and other chronic lower respiratory diseases: Secondary | ICD-10-CM | POA: Diagnosis not present

## 2023-10-03 DIAGNOSIS — K219 Gastro-esophageal reflux disease without esophagitis: Secondary | ICD-10-CM | POA: Diagnosis not present

## 2023-10-03 DIAGNOSIS — Z886 Allergy status to analgesic agent status: Secondary | ICD-10-CM | POA: Diagnosis not present

## 2023-10-03 DIAGNOSIS — Z7951 Long term (current) use of inhaled steroids: Secondary | ICD-10-CM | POA: Diagnosis not present

## 2023-10-03 DIAGNOSIS — Z8249 Family history of ischemic heart disease and other diseases of the circulatory system: Secondary | ICD-10-CM | POA: Diagnosis not present

## 2023-10-03 DIAGNOSIS — J441 Chronic obstructive pulmonary disease with (acute) exacerbation: Secondary | ICD-10-CM | POA: Diagnosis not present

## 2023-10-03 DIAGNOSIS — Z85118 Personal history of other malignant neoplasm of bronchus and lung: Secondary | ICD-10-CM | POA: Diagnosis not present

## 2023-10-03 DIAGNOSIS — E739 Lactose intolerance, unspecified: Secondary | ICD-10-CM | POA: Diagnosis not present

## 2023-10-03 DIAGNOSIS — Z885 Allergy status to narcotic agent status: Secondary | ICD-10-CM | POA: Diagnosis not present

## 2023-10-03 DIAGNOSIS — F419 Anxiety disorder, unspecified: Secondary | ICD-10-CM | POA: Diagnosis not present

## 2023-10-03 DIAGNOSIS — Z1152 Encounter for screening for COVID-19: Secondary | ICD-10-CM | POA: Diagnosis not present

## 2023-10-03 DIAGNOSIS — J440 Chronic obstructive pulmonary disease with acute lower respiratory infection: Secondary | ICD-10-CM | POA: Diagnosis not present

## 2023-10-03 DIAGNOSIS — J9621 Acute and chronic respiratory failure with hypoxia: Secondary | ICD-10-CM | POA: Diagnosis not present

## 2023-10-03 DIAGNOSIS — Z9981 Dependence on supplemental oxygen: Secondary | ICD-10-CM | POA: Diagnosis not present

## 2023-10-03 HISTORY — DX: Pneumonia, unspecified organism: J18.9

## 2023-10-03 LAB — BASIC METABOLIC PANEL
Anion gap: 8 (ref 5–15)
BUN: 19 mg/dL (ref 8–23)
CO2: 23 mmol/L (ref 22–32)
Calcium: 9.2 mg/dL (ref 8.9–10.3)
Chloride: 101 mmol/L (ref 98–111)
Creatinine, Ser: 0.75 mg/dL (ref 0.44–1.00)
GFR, Estimated: >60 mL/min (ref >60)
Glucose, Bld: 118 mg/dL — ABNORMAL HIGH (ref 70–99)
Potassium: 4.3 mmol/L (ref 3.5–5.1)
Sodium: 132 mmol/L — ABNORMAL LOW (ref 135–145)

## 2023-10-03 LAB — CBC
HCT: 37.7 % (ref 36.0–46.0)
Hemoglobin: 11.9 g/dL — ABNORMAL LOW (ref 12.0–15.0)
MCH: 26.6 pg (ref 26.0–34.0)
MCHC: 31.6 g/dL (ref 30.0–36.0)
MCV: 84.2 fL (ref 80.0–100.0)
Platelets: 292 10*3/uL (ref 150–400)
RBC: 4.48 MIL/uL (ref 3.87–5.11)
RDW: 12.7 % (ref 11.5–15.5)
WBC: 7.3 10*3/uL (ref 4.0–10.5)
nRBC: 0 % (ref 0.0–0.2)

## 2023-10-03 MED ORDER — METHYLPREDNISOLONE SODIUM SUCC 40 MG IJ SOLR
40.0000 mg | Freq: Two times a day (BID) | INTRAMUSCULAR | Status: AC
  Administered 2023-10-03: 40 mg via INTRAVENOUS
  Filled 2023-10-03: qty 1

## 2023-10-03 MED ORDER — PREDNISONE 20 MG PO TABS
40.0000 mg | ORAL_TABLET | Freq: Every day | ORAL | Status: DC
  Administered 2023-10-04: 40 mg via ORAL
  Filled 2023-10-03: qty 2

## 2023-10-03 NOTE — Telephone Encounter (Signed)
 The pt has been cancelled and will call back to reschedule in 2-3 months. Recall added

## 2023-10-03 NOTE — Progress Notes (Signed)
   10/03/23 1336  TOC Brief Assessment  Insurance and Status Reviewed  Patient has primary care physician Yes  Home environment has been reviewed home with friends  Prior level of function: independent  Prior/Current Home Services No current home services  Social Drivers of Health Review SDOH reviewed no interventions necessary  Readmission risk has been reviewed Yes  Transition of care needs no transition of care needs at this time

## 2023-10-03 NOTE — Telephone Encounter (Signed)
 Patient called and stated that she is in the hospital due to her having pneumonemia. Patient also stated that she is needing to cancel her hospital procedure. Please advise.

## 2023-10-03 NOTE — Plan of Care (Signed)
  Problem: Clinical Measurements: Goal: Ability to maintain clinical measurements within normal limits will improve Outcome: Progressing   Problem: Clinical Measurements: Goal: Will remain free from infection Outcome: Progressing   Problem: Clinical Measurements: Goal: Diagnostic test results will improve Outcome: Progressing   Problem: Clinical Measurements: Goal: Respiratory complications will improve Outcome: Progressing   

## 2023-10-03 NOTE — Progress Notes (Signed)
 Mobility Specialist - Progress Note   10/03/23 0858  Oxygen  Therapy  SpO2 (!) 88 %  O2 Device Room Air  Patient Activity (if Appropriate) Ambulating  Mobility  Activity Ambulated independently in hallway  Level of Assistance Independent  Assistive Device None  Distance Ambulated (ft) 275 ft  Activity Response Tolerated well  Mobility Referral Yes  Mobility visit 1 Mobility  Mobility Specialist Start Time (ACUTE ONLY) 0848  Mobility Specialist Stop Time (ACUTE ONLY) 0858  Mobility Specialist Time Calculation (min) (ACUTE ONLY) 10 min   Nurse requested Mobility Specialist to perform oxygen  saturation test with pt which includes removing pt from oxygen  both at rest and while ambulating.  Below are the results from that testing.     Patient Saturations on Room Air at Rest = spO2 93%  Patient Saturations on Room Air while Ambulating = sp02 88% .   Patient Saturations on 0.5 Liters of oxygen  while Ambulating = sp02 90%  At end of testing pt left in room on 0.5  Liters of oxygen .  Reported results to nurse.  Pt received in bed and agreeable to mobility. Some audible SOB during ambulation. No complaints during session. Pt to bed after session with all needs met.   Pre-mobility: 78 HR, 93% SpO2 (RA) During mobility: 88-95% SpO2 (RA/ 0.5L [place]) Post-mobility: 80 HR, 92% SPO2 (0.5L [place])  Chief Technology Officer

## 2023-10-03 NOTE — Progress Notes (Signed)
 PROGRESS NOTE  [name] FMW:[id number] DOB: [birth year] DOA: 10/01/2023 PCP: Merna Huxley, NP  HPI/Recap of past 24 hours: [name] is a 68 y.o. female with medical history significant of COPD, adenocarcinoma of lung, anxiety, GERD, migraine who presents with shortness of breath.  She uses 2 L nasal cannula O2 intermittently at baseline, but was found to be hypoxic by EMS and requires 3 L nasal cannula O2 currently.  Also admits to productive cough.  Denies any fevers, chest pain, abdominal pain or nausea or vomiting. In the ED, labs overall unremarkable, no leukocytosis.  COVID, influenza, RSV were negative.  Chest x-ray showed opacity over right mid chest, possible pneumonia.  Patient was started on empiric IV antibiotics and admitted to the hospital.   Today, noted to desaturate on room air upon ambulation, still reports subjective shortness of breath as well.     Assessment/Plan: Principal Problem:   CAP (community acquired pneumonia) Active Problems:   GERD   Adenocarcinoma of left lung, stage 1 (HCC)   S/P partial lobectomy of lung   Adenocarcinoma of right lung, stage 1 (HCC)   COPD GOLD 3    Anxiety   Chronic respiratory failure with hypoxia (HCC)   Community acquired pneumonia   Acute on chronic hypoxic respiratory failure with CAP Currently on 2L of O2 COVID, RSV, influenza negative Strep pneumo and Legionella antigen both negative Rocephin /Azithromycin  Supplemental O2 as needed   Possible COPD exacerbation Continue Brovana , Pulmicort , Yupelri  Continue IV Solu-Medrol  Supplemental O2 Followed by Dr. Darlean, pulmonology   Anxiety Klonopin , Paxil    GERD PPI   History of lung cancer    Estimated body mass index is 27.46 kg/m as calculated from the following:   Height as of this encounter: 5' 4 (1.626 m).   Weight as of this encounter: 72.6 kg.     Code Status: Full  Family Communication: None at bedside  Disposition Plan: Status is:  Inpatient The patient will require care spanning > 2 midnights and should be moved to inpatient because: Level of care      Consultants: None  Procedures: None  Antimicrobials: Ceftriaxone  Azithromycin   DVT prophylaxis: Lovenox    Objective: Vitals:   10/03/23 0858 10/03/23 0934 10/03/23 0940 10/03/23 1305  BP:    122/71  Pulse:   74 73  Resp:    14  Temp:    97.9 F (36.6 C)  TempSrc:    Oral  SpO2: (!) 88% 99% 99% 97%  Weight:      Height:        Intake/Output Summary (Last 24 hours) at 10/03/2023 1629 Last data filed at 10/03/2023 1014 Gross per 24 hour  Intake 423 ml  Output 0 ml  Net 423 ml   Filed Weights   10/01/23 1104  Weight: 72.6 kg    Exam: General: NAD, chronically ill-appearing Cardiovascular: S1, S2 present Respiratory: Diminished breath sounds bilaterally Abdomen: Soft, nontender, nondistended, bowel sounds present Musculoskeletal: No bilateral pedal edema noted Skin: Normal Psychiatry: Normal mood     Data Reviewed: CBC: Recent Labs  Lab 10/01/23 0515 10/02/23 0442 10/03/23 0445  WBC 9.4 5.3 7.3  HGB 13.7 11.4* 11.9*  HCT 42.9 36.3 37.7  MCV 82.7 83.8 84.2  PLT 320 255 292   Basic Metabolic Panel: Recent Labs  Lab 10/01/23 0650 10/02/23 0442 10/03/23 0445  NA 128* 136 132*  K 4.3 4.0 4.3  CL 99 104 101  CO2 20* 24 23  GLUCOSE 126* 120* 118*  BUN  17 15 19   CREATININE 0.71 0.69 0.75  CALCIUM  8.8* 8.8* 9.2   GFR: Estimated Creatinine Clearance: 65.8 mL/min (by C-G formula based on SCr of 0.75 mg/dL). Liver Function Tests: No results for input(s): AST, ALT, ALKPHOS, BILITOT, PROT, ALBUMIN  in the last 168 hours. No results for input(s): LIPASE, AMYLASE in the last 168 hours. No results for input(s): AMMONIA in the last 168 hours. Coagulation Profile: No results for input(s): INR, PROTIME in the last 168 hours. Cardiac Enzymes: No results for input(s): CKTOTAL, CKMB, CKMBINDEX, TROPONINI  in the last 168 hours. BNP (last 3 results) No results for input(s): PROBNP in the last 8760 hours. HbA1C: No results for input(s): HGBA1C in the last 72 hours. CBG: No results for input(s): GLUCAP in the last 168 hours. Lipid Profile: No results for input(s): CHOL, HDL, LDLCALC, TRIG, CHOLHDL, LDLDIRECT in the last 72 hours. Thyroid  Function Tests: No results for input(s): TSH, T4TOTAL, FREET4, T3FREE, THYROIDAB in the last 72 hours. Anemia Panel: No results for input(s): VITAMINB12, FOLATE, FERRITIN, TIBC, IRON, RETICCTPCT in the last 72 hours. Urine analysis:    Component Value Date/Time   COLORURINE YELLOW 12/14/2019 0839   APPEARANCEUR CLEAR 12/14/2019 0839   LABSPEC 1.013 12/14/2019 0839   PHURINE 5.0 12/14/2019 0839   GLUCOSEU NEGATIVE 12/14/2019 0839   HGBUR NEGATIVE 12/14/2019 0839   BILIRUBINUR NEGATIVE 12/14/2019 0839   BILIRUBINUR n 04/30/2014 1109   KETONESUR NEGATIVE 12/14/2019 0839   PROTEINUR NEGATIVE 12/14/2019 0839   UROBILINOGEN 0.2 04/30/2014 1109   NITRITE NEGATIVE 12/14/2019 0839   LEUKOCYTESUR NEGATIVE 12/14/2019 0839   Sepsis Labs: @LABRCNTIP (procalcitonin:4,lacticidven:4)  ) Recent Results (from the past 240 hours)  Resp panel by RT-PCR (RSV, Flu A&B, Covid) Anterior Nasal Swab     Status: None   Collection Time: 10/01/23  5:15 AM   Specimen: Anterior Nasal Swab  Result Value Ref Range Status   SARS Coronavirus 2 by RT PCR NEGATIVE NEGATIVE Final    Comment: (NOTE) SARS-CoV-2 target nucleic acids are NOT DETECTED.  The SARS-CoV-2 RNA is generally detectable in upper respiratory specimens during the acute phase of infection. The lowest concentration of SARS-CoV-2 viral copies this assay can detect is 138 copies/mL. A negative result does not preclude SARS-Cov-2 infection and should not be used as the sole basis for treatment or other patient management decisions. A negative result may occur with  improper  specimen collection/handling, submission of specimen other than nasopharyngeal swab, presence of viral mutation(s) within the areas targeted by this assay, and inadequate number of viral copies(<138 copies/mL). A negative result must be combined with clinical observations, patient history, and epidemiological information. The expected result is Negative.  Fact Sheet for Patients:  bloggercourse.com  Fact Sheet for Healthcare Providers:  seriousbroker.it  This test is no t yet approved or cleared by the [place]  FDA and  has been authorized for detection and/or diagnosis of SARS-CoV-2 by FDA under an Emergency Use Authorization (EUA). This EUA will remain  in effect (meaning this test can be used) for the duration of the COVID-19 declaration under Section 564(b)(1) of the Act, 21 U.S.C.section 360bbb-3(b)(1), unless the authorization is terminated  or revoked sooner.       Influenza A by PCR NEGATIVE NEGATIVE Final   Influenza B by PCR NEGATIVE NEGATIVE Final    Comment: (NOTE) The Xpert Xpress SARS-CoV-2/FLU/RSV plus assay is intended as an aid in the diagnosis of influenza from Nasopharyngeal swab specimens and should not be used as a sole basis for  treatment. Nasal washings and aspirates are unacceptable for Xpert Xpress SARS-CoV-2/FLU/RSV testing.  Fact Sheet for Patients: bloggercourse.com  Fact Sheet for Healthcare Providers: seriousbroker.it  This test is not yet approved or cleared by the [place]  FDA and has been authorized for detection and/or diagnosis of SARS-CoV-2 by FDA under an Emergency Use Authorization (EUA). This EUA will remain in effect (meaning this test can be used) for the duration of the COVID-19 declaration under Section 564(b)(1) of the Act, 21 U.S.C. section 360bbb-3(b)(1), unless the authorization is terminated or revoked.     Resp  Syncytial Virus by PCR NEGATIVE NEGATIVE Final    Comment: (NOTE) Fact Sheet for Patients: bloggercourse.com  Fact Sheet for Healthcare Providers: seriousbroker.it  This test is not yet approved or cleared by the [place]  FDA and has been authorized for detection and/or diagnosis of SARS-CoV-2 by FDA under an Emergency Use Authorization (EUA). This EUA will remain in effect (meaning this test can be used) for the duration of the COVID-19 declaration under Section 564(b)(1) of the Act, 21 U.S.C. section 360bbb-3(b)(1), unless the authorization is terminated or revoked.  Performed at [facility], [address] [place], [place] [address]   Blood culture (routine x 2)     Status: None (Preliminary result)   Collection Time: 10/01/23  9:57 AM   Specimen: BLOOD  Result Value Ref Range Status   Specimen Description   Final    BLOOD BLOOD LEFT ARM Performed at [facility], [address] [place], [place] [address]    Special Requests   Final    BOTTLES DRAWN AEROBIC AND ANAEROBIC Blood Culture results may not be optimal due to an inadequate volume of blood received in culture bottles Performed at [facility], [address] [place], [place] [address]    Culture   Final    NO GROWTH 2 DAYS Performed at [facility], [address] [place], [place] [address]    Report Status PENDING  Incomplete  Blood culture (routine x 2)     Status: None (Preliminary result)   Collection Time: 10/01/23  9:57 AM   Specimen: BLOOD  Result Value Ref Range Status   Specimen Description   Final    BLOOD BLOOD RIGHT ARM Performed at [facility], [address] [place], [place] [address]    Special Requests   Final    BOTTLES DRAWN AEROBIC AND ANAEROBIC Blood Culture results may not be optimal due to an inadequate volume of blood received in culture  bottles Performed at [facility], [address] [place], [place] [address]    Culture   Final    NO GROWTH 2 DAYS Performed at [facility], [address] [place], [place] [address]    Report Status PENDING  Incomplete  Expectorated Sputum Assessment w Gram Stain, Rflx to Resp Cult     Status: None   Collection Time: 10/01/23 10:39 AM   Specimen: Sputum  Result Value Ref Range Status   Specimen Description SPUTUM  Final   Special Requests NONE  Final   Sputum evaluation   Final    THIS SPECIMEN IS ACCEPTABLE FOR SPUTUM CULTURE Performed at [facility], [address], [place], [place] [address]    Report Status 10/02/2023 FINAL  Final  Culture, Respiratory w Gram Stain     Status: None (Preliminary result)   Collection Time: 10/01/23 10:39 AM   Specimen:  SPU  Result Value Ref Range Status   Specimen Description   Final    SPUTUM Performed at [facility], [address], [place], [place] [address]    Special Requests   Final    NONE Reflexed from [id number] Performed at [facility], [address] [place], [place] [address]    Gram Stain   Final    MODERATE WBC PRESENT,BOTH PMN AND MONONUCLEAR NO ORGANISMS SEEN    Culture   Final    CULTURE REINCUBATED FOR BETTER GROWTH Performed at [facility], [address] [place], [place] [address]    Report Status PENDING  Incomplete      Studies: No results found.  Scheduled Meds:  arformoterol   15 mcg Nebulization BID   azithromycin   500 mg Oral Daily   budesonide  (PULMICORT ) nebulizer solution  0.5 mg Nebulization BID   enoxaparin  (LOVENOX ) injection  40 mg Subcutaneous Q24H   methylPREDNISolone  (SOLU-MEDROL ) injection  40 mg Intravenous Q12H   pantoprazole   80 mg Oral Q1200   PARoxetine   40 mg Oral Daily   propranolol  ER  60 mg Oral Daily   revefenacin   175 mcg Nebulization Daily   sodium chloride  flush  3-10 mL Intravenous  Q12H    Continuous Infusions:  cefTRIAXone  (ROCEPHIN )  IV 2 g (10/03/23 0803)     LOS: 0 days     Lebron JINNY Cage, MD [facility]  If 7PM-7AM, please contact night-coverage [email or web address] 10/03/2023, 4:29 PM

## 2023-10-04 ENCOUNTER — Other Ambulatory Visit (HOSPITAL_COMMUNITY): Payer: Self-pay

## 2023-10-04 LAB — CULTURE, RESPIRATORY W GRAM STAIN: Culture: NORMAL

## 2023-10-04 MED ORDER — PREDNISONE 20 MG PO TABS
ORAL_TABLET | ORAL | 0 refills | Status: DC
  Filled 2023-10-04: qty 15, 12d supply, fill #0

## 2023-10-04 MED ORDER — CEFDINIR 300 MG PO CAPS
300.0000 mg | ORAL_CAPSULE | Freq: Two times a day (BID) | ORAL | 0 refills | Status: AC
  Filled 2023-10-04: qty 4, 2d supply, fill #0

## 2023-10-04 MED ORDER — AZITHROMYCIN 500 MG PO TABS
500.0000 mg | ORAL_TABLET | Freq: Every day | ORAL | 0 refills | Status: AC
  Filled 2023-10-04: qty 2, 2d supply, fill #0

## 2023-10-04 NOTE — Plan of Care (Signed)
   Problem: Health Behavior/Discharge Planning: Goal: Ability to manage health-related needs will improve Outcome: Progressing

## 2023-10-04 NOTE — Discharge Summary (Signed)
 Physician Discharge Summary   Patient: [name] MRN: [id number] DOB: 1956-08-23  Admit date:     10/01/2023  Discharge date: 10/04/23  Discharge Physician: Lebron JINNY Cage   PCP: Merna Huxley, NP   Recommendations at discharge:   Follow-up with PCP in 1 week  Discharge Diagnoses: Principal Problem:   CAP (community acquired pneumonia) Active Problems:   GERD   Adenocarcinoma of left lung, stage 1 (HCC)   S/P partial lobectomy of lung   Adenocarcinoma of right lung, stage 1 (HCC)   COPD GOLD 3    Anxiety   Chronic respiratory failure with hypoxia (HCC)   Community acquired pneumonia    Hospital Course: [name] is a 68 y.o. female with medical history significant of COPD, adenocarcinoma of lung, anxiety, GERD, migraine who presents with shortness of breath.  She uses 2 L nasal cannula O2 intermittently at baseline, but was found to be hypoxic by EMS and requires 3 L nasal cannula O2 currently.  Also admits to productive cough.  Denies any fevers, chest pain, abdominal pain or nausea or vomiting. In the ED, labs overall unremarkable, no leukocytosis.  COVID, influenza, RSV were negative.  Chest x-ray showed opacity over right mid chest, possible pneumonia.  Patient was started on empiric IV antibiotics and admitted to the hospital.   Today, patient reports improvement overall, still with some chronic SOB.  Denies any chest pain, abdominal pain, nausea/vomiting, fever/chills.   Assessment and Plan:  Acute on chronic hypoxic respiratory failure with CAP Currently on 2L of O2, advised patient to continue supplemental oxygen  for now (has oxygen  at home, intermittently on 2 L) COVID, RSV, influenza negative Strep pneumo and Legionella antigen both negative S/p Rocephin /Azithromycin --> switch to p.o. azithromycin  and p.o. cefdinir  to complete 5 days total of antibiotics Supplemental O2 as needed   Possible COPD exacerbation Continue home nebulizers,  inhalers S/p IV Solu-Medrol --> switch to p.o. prednisone  tapered dose Supplemental O2 Followed by Dr. Darlean, pulmonology   Anxiety Klonopin , Paxil    GERD PPI    History of lung cancer Outpatient follow-up    Consultants: None Procedures performed: None Disposition: Home Diet recommendation:  Regular diet    DISCHARGE MEDICATION: Allergies as of 10/04/2023       Reactions   Aspirin Shortness Of Breath   Trelegy Ellipta  [fluticasone -umeclidin-vilant] Shortness Of Breath   She reports that is made her cough worse.    Morphine Sulfate Other (See Comments)   RED STREAKS ON ARMS.   Oxycodone  Itching, Rash   Hydrocodone  Itching   Glycopyrrolate  Other (See Comments)   UNSPECIFIED REACTION  pt unsure if she is allergic.   Lactose Intolerance (gi) Nausea And Vomiting   Milk only.Patient states that she can eat cheese.   Penicillins Other (See Comments)    Heartburn, upset stomach only & tolerated AMOXIL  after this reaction   Sulfa Antibiotics Tinitus        Medication List     TAKE these medications    albuterol  108 (90 Base) MCG/ACT inhaler Commonly known as: VENTOLIN  HFA Inhale 2 puffs into the lungs every 6 (six) hours as needed for wheezing or shortness of breath.   albuterol  (2.5 MG/3ML) 0.083% nebulizer solution Commonly known as: PROVENTIL  Take 3 mLs (2.5 mg total) by nebulization every 4 (four) hours as needed for wheezing or shortness of breath.   azithromycin  500 MG tablet Commonly known as: Zithromax  Take 1 tablet (500 mg total) by mouth daily for 2 days. Start taking on: October 05, 2023   Breztri  Aerosphere 160-9-4.8 MCG/ACT Aero Generic drug: Budeson-Glycopyrrol-Formoterol  Inhale 2 puffs into the lungs 2 (two) times daily. What changed: how much to take   cefdinir  300 MG capsule Commonly known as: OMNICEF  Take 1 capsule (300 mg total) by mouth 2 (two) times daily for 2 days. Start taking on: October 05, 2023   clonazePAM  0.5 MG  tablet Commonly known as: KLONOPIN  TAKE 1 TABLET BY MOUTH THREE TIMES DAILY AS NEEDED FOR ANXIETY What changed: when to take this   esomeprazole  40 MG capsule Commonly known as: NEXIUM  Take 1 capsule (40 mg total) by mouth daily. What changed: when to take this   guaiFENesin  600 MG 12 hr tablet Commonly known as: MUCINEX  Take 600 mg by mouth daily.   PARoxetine  40 MG tablet Commonly known as: PAXIL  Take 1 tablet by mouth once daily What changed: when to take this   predniSONE  20 MG tablet Commonly known as: DELTASONE  Take 2 tablets by mouth daily with breakfast for 3 days, THEN take 1.5 tablets daily for 3 days, THEN take 1 tablet daily for 3 days, THEN take 0.5 tablets daily for 3 days. Start taking on: October 05, 2023   propranolol  ER 60 MG 24 hr capsule Commonly known as: INDERAL  LA Take 1 capsule by mouth once daily What changed: when to take this   traZODone  100 MG tablet Commonly known as: DESYREL  Take 1 tablet (100 mg total) by mouth at bedtime as needed. for sleep. Need physical exam for further refills What changed:  when to take this additional instructions        Follow-up Information     Merna Huxley, NP. Schedule an appointment as soon as possible for a visit in 1 week(s).   Specialty: Family Medicine Contact information: [address] [place] [address] [phone]                Discharge Exam: [name]   10/01/23 1104  Weight: 72.6 kg   General: NAD, chronically ill-appearing Cardiovascular: S1, S2 present Respiratory: Minich breath sounds bilaterally Abdomen: Soft, nontender, nondistended, bowel sounds present Musculoskeletal: No bilateral pedal edema noted Skin: Normal Psychiatry: Normal mood   Condition at discharge: stable  The results of significant diagnostics from this hospitalization (including imaging, microbiology, ancillary and laboratory) are listed below for reference.   Imaging Studies: DG  Chest 2 View Result Date: 10/01/2023 CLINICAL DATA:  Increasing shortness of breath EXAM: CHEST - 2 VIEW COMPARISON:  Chest CT 07/15/2023 FINDINGS: COPD with large lung volumes and emphysema by prior. There is architectural distortion from prior lung surgery on both sides. Vague hazy density over the right mid chest could be pneumonia, no underlying nodule by recent chest CT, area separate from the tree in bud opacity described on CT. No edema, effusion, or pneumothorax. IMPRESSION: 1. Hazy density over the right mid chest, possible pneumonia. 2. COPD and prior lung surgery. Electronically Signed   By: Dorn Roulette M.D.   On: 10/01/2023 07:14    Microbiology: Results for orders placed or performed during the hospital encounter of 10/01/23  Resp panel by RT-PCR (RSV, Flu A&B, Covid) Anterior Nasal Swab     Status: None   Collection Time: 10/01/23  5:15 AM   Specimen: Anterior Nasal Swab  Result Value Ref Range Status   SARS Coronavirus 2 by RT PCR NEGATIVE NEGATIVE Final    Comment: (NOTE) SARS-CoV-2 target nucleic acids are NOT DETECTED.  The SARS-CoV-2 RNA is generally detectable in  upper respiratory specimens during the acute phase of infection. The lowest concentration of SARS-CoV-2 viral copies this assay can detect is 138 copies/mL. A negative result does not preclude SARS-Cov-2 infection and should not be used as the sole basis for treatment or other patient management decisions. A negative result may occur with  improper specimen collection/handling, submission of specimen other than nasopharyngeal swab, presence of viral mutation(s) within the areas targeted by this assay, and inadequate number of viral copies(<138 copies/mL). A negative result must be combined with clinical observations, patient history, and epidemiological information. The expected result is Negative.  Fact Sheet for Patients:  bloggercourse.com  Fact Sheet for Healthcare Providers:   seriousbroker.it  This test is no t yet approved or cleared by the [place]  FDA and  has been authorized for detection and/or diagnosis of SARS-CoV-2 by FDA under an Emergency Use Authorization (EUA). This EUA will remain  in effect (meaning this test can be used) for the duration of the COVID-19 declaration under Section 564(b)(1) of the Act, 21 U.S.C.section 360bbb-3(b)(1), unless the authorization is terminated  or revoked sooner.       Influenza A by PCR NEGATIVE NEGATIVE Final   Influenza B by PCR NEGATIVE NEGATIVE Final    Comment: (NOTE) The Xpert Xpress SARS-CoV-2/FLU/RSV plus assay is intended as an aid in the diagnosis of influenza from Nasopharyngeal swab specimens and should not be used as a sole basis for treatment. Nasal washings and aspirates are unacceptable for Xpert Xpress SARS-CoV-2/FLU/RSV testing.  Fact Sheet for Patients: bloggercourse.com  Fact Sheet for Healthcare Providers: seriousbroker.it  This test is not yet approved or cleared by the [place]  FDA and has been authorized for detection and/or diagnosis of SARS-CoV-2 by FDA under an Emergency Use Authorization (EUA). This EUA will remain in effect (meaning this test can be used) for the duration of the COVID-19 declaration under Section 564(b)(1) of the Act, 21 U.S.C. section 360bbb-3(b)(1), unless the authorization is terminated or revoked.     Resp Syncytial Virus by PCR NEGATIVE NEGATIVE Final    Comment: (NOTE) Fact Sheet for Patients: bloggercourse.com  Fact Sheet for Healthcare Providers: seriousbroker.it  This test is not yet approved or cleared by the [place]  FDA and has been authorized for detection and/or diagnosis of SARS-CoV-2 by FDA under an Emergency Use Authorization (EUA). This EUA will remain in effect (meaning this test can be used) for  the duration of the COVID-19 declaration under Section 564(b)(1) of the Act, 21 U.S.C. section 360bbb-3(b)(1), unless the authorization is terminated or revoked.  Performed at [facility], [address], [place], [place] [address]   Blood culture (routine x 2)     Status: None (Preliminary result)   Collection Time: 10/01/23  9:57 AM   Specimen: BLOOD  Result Value Ref Range Status   Specimen Description   Final    BLOOD BLOOD LEFT ARM Performed at [facility], [address] [place], [place] [address]    Special Requests   Final    BOTTLES DRAWN AEROBIC AND ANAEROBIC Blood Culture results may not be optimal due to an inadequate volume of blood received in culture bottles Performed at [facility], [address] [place], [place] [address]    Culture   Final    NO GROWTH 3 DAYS Performed at [facility], [address] [place], [place] [address]    Report Status PENDING  Incomplete  Blood culture (routine x 2)  Status: None (Preliminary result)   Collection Time: 10/01/23  9:57 AM   Specimen: BLOOD  Result Value Ref Range Status   Specimen Description   Final    BLOOD BLOOD RIGHT ARM Performed at [facility], [address], [place], [place] [address]    Special Requests   Final    BOTTLES DRAWN AEROBIC AND ANAEROBIC Blood Culture results may not be optimal due to an inadequate volume of blood received in culture bottles Performed at [facility], [address] [place], [place] [address]    Culture   Final    NO GROWTH 3 DAYS Performed at [facility], [address] [place], [place] [address]    Report Status PENDING  Incomplete  Expectorated Sputum Assessment w Gram Stain, Rflx to Resp Cult     Status: None   Collection Time: 10/01/23 10:39 AM   Specimen: Sputum  Result Value Ref Range Status   Specimen Description SPUTUM  Final   Special Requests  NONE  Final   Sputum evaluation   Final    THIS SPECIMEN IS ACCEPTABLE FOR SPUTUM CULTURE Performed at [facility], [address], [place], [place] [address]    Report Status 10/02/2023 FINAL  Final  Culture, Respiratory w Gram Stain     Status: None (Preliminary result)   Collection Time: 10/01/23 10:39 AM   Specimen: SPU  Result Value Ref Range Status   Specimen Description   Final    SPUTUM Performed at [facility], [address], [place], [place] [address]    Special Requests   Final    NONE Reflexed from [id number] Performed at [facility], [address], [place], [place] [address]    Gram Stain   Final    MODERATE WBC PRESENT,BOTH PMN AND MONONUCLEAR NO ORGANISMS SEEN    Culture   Final    CULTURE REINCUBATED FOR BETTER GROWTH Performed at [facility], [address] [place], [place] [address]    Report Status PENDING  Incomplete    Labs: CBC: Recent Labs  Lab 10/01/23 0515 10/02/23 0442 10/03/23 0445  WBC 9.4 5.3 7.3  HGB 13.7 11.4* 11.9*  HCT 42.9 36.3 37.7  MCV 82.7 83.8 84.2  PLT 320 255 292   Basic Metabolic Panel: Recent Labs  Lab 10/01/23 0650 10/02/23 0442 10/03/23 0445  NA 128* 136 132*  K 4.3 4.0 4.3  CL 99 104 101  CO2 20* 24 23  GLUCOSE 126* 120* 118*  BUN 17 15 19   CREATININE 0.71 0.69 0.75  CALCIUM  8.8* 8.8* 9.2   Liver Function Tests: No results for input(s): AST, ALT, ALKPHOS, BILITOT, PROT, ALBUMIN  in the last 168 hours. CBG: No results for input(s): GLUCAP in the last 168 hours.  Discharge time spent: greater than 30 minutes.  Signed: Lebron JINNY Cage, MD [facility] 10/04/2023

## 2023-10-04 NOTE — Plan of Care (Signed)

## 2023-10-06 LAB — CULTURE, BLOOD (ROUTINE X 2)
Culture: NO GROWTH
Culture: NO GROWTH

## 2023-10-07 ENCOUNTER — Encounter (HOSPITAL_COMMUNITY): Admission: RE | Payer: Self-pay | Source: Home / Self Care

## 2023-10-07 ENCOUNTER — Ambulatory Visit (HOSPITAL_COMMUNITY): Admission: RE | Admit: 2023-10-07 | Payer: Medicare HMO | Source: Home / Self Care | Admitting: Gastroenterology

## 2023-10-07 ENCOUNTER — Telehealth: Payer: Self-pay

## 2023-10-07 SURGERY — ESOPHAGOGASTRODUODENOSCOPY (EGD) WITH PROPOFOL
Anesthesia: Monitor Anesthesia Care

## 2023-10-07 NOTE — Transitions of Care (Post Inpatient/ED Visit) (Signed)
 10/07/2023  Name: [name] MRN: [id number] DOB: [birth year]  Today's TOC FU Call Status: Today's TOC FU Call Status:: Successful TOC FU Call Completed TOC FU Call Complete Date: 10/07/23 Patient's Name and Date of Birth confirmed.  Transition Care Management Follow-up Telephone Call Date of Discharge: 10/04/23 Discharge Facility: [facility]) Type of Discharge: Inpatient Admission Primary Inpatient Discharge Diagnosis:: CAP How have you been since you were released from the hospital?: Better (Pt pleased to report how much better she is feeling-denies any resp /acute sxs-states she is so much clearerreferring to her breathing,using oxygen  prn-sats 97% or higher,eating & sleeping good, up walking-some SOB with exertion but normal for her) Any questions or concerns?: No  Items Reviewed: Did you receive and understand the discharge instructions provided?: Yes Medications obtained,verified, and reconciled?: Yes (Medications Reviewed) Any new allergies since your discharge?: No Dietary orders reviewed?: Yes Type of Diet Ordered:: low salt/heart healthy Do you have support at home?: Yes Name of Support/Comfort Primary Source: pt voices she has a roommate that assists her as needed  Medications Reviewed Today: Medications Reviewed Today     Reviewed by Rochel Rama Loose, RN (Registered Nurse) on 10/07/23 at 1145  Med List Status: <None>   Medication Order Taking? Sig Documenting Provider Last Dose Status Informant  albuterol  (PROVENTIL ) (2.5 MG/3ML) 0.083% nebulizer solution [id number] Yes Take 3 mLs (2.5 mg total) by nebulization every 4 (four) hours as needed for wheezing or shortness of breath. Darlean Ozell NOVAK, MD Taking Active Self, Pharmacy Records  albuterol  (VENTOLIN  HFA) 108 [phone] Base) MCG/ACT inhaler [id number] Yes Inhale 2 puffs into the lungs every 6 (six) hours as needed for wheezing or shortness of breath. Koberlein, Junell C, MD Taking Active Self,  Pharmacy Records  azithromycin  (ZITHROMAX ) 500 MG tablet [id number] Yes Take 1 tablet (500 mg total) by mouth daily for 2 days. Ezenduka, Nkeiruka J, MD 10/06/2023 Active   Budeson-Glycopyrrol-Formoterol  (BREZTRI  AEROSPHERE) 160-9-4.8 MCG/ACT AERO [id number] Yes Inhale 2 puffs into the lungs 2 (two) times daily.  Patient taking differently: Inhale 1 puff into the lungs 2 (two) times daily.   Hope Almarie ORN, NP 10/07/2023 Active Self, Pharmacy Records  cefdinir  (OMNICEF ) 300 MG capsule [id number] Yes Take 1 capsule (300 mg total) by mouth 2 (two) times daily for 2 days. Ezenduka, Nkeiruka J, MD 10/07/2023 Active   clonazePAM  (KLONOPIN ) 0.5 MG tablet [id number] Yes TAKE 1 TABLET BY MOUTH THREE TIMES DAILY AS NEEDED FOR ANXIETY  Patient taking differently: Take 0.5 mg by mouth at bedtime. for anxiety   Merna Huxley, NP 10/06/2023 Active Self, Pharmacy Records  esomeprazole  (NEXIUM ) 40 MG capsule [id number] Yes Take 1 capsule (40 mg total) by mouth daily.  Patient taking differently: Take 40 mg by mouth in the morning and at bedtime.   Mansouraty, Aloha Raddle., MD 10/07/2023 Active Self, Pharmacy Records  guaiFENesin  (MUCINEX ) 600 MG 12 hr tablet [id number] Yes Take 600 mg by mouth daily. [provider] Taking Active Self, Pharmacy Records  PARoxetine  (PAXIL ) 40 MG tablet [id number] Yes Take 1 tablet by mouth once daily  Patient taking differently: Take 40 mg by mouth at bedtime.   Merna Huxley, NP 10/06/2023 Active Self, Pharmacy Records  predniSONE  (DELTASONE ) 20 MG tablet [id number] Yes Take 2 tablets by mouth daily with breakfast for 3 days, THEN take 1.5 tablets daily for 3 days, THEN take 1 tablet daily for 3 days, THEN take 0.5 tablets daily for 3 days. Ezenduka, Nkeiruka J, MD 10/07/2023 Active   propranolol   ER (INDERAL  LA) 60 MG 24 hr capsule [id number] Yes Take 1 capsule by mouth once daily  Patient taking differently: Take 60 mg by mouth at bedtime.   Nafziger, Cory, NP 10/06/2023 Active Self,  Pharmacy Records  traZODone  (DESYREL ) 100 MG tablet [id number] Yes Take 1 tablet (100 mg total) by mouth at bedtime as needed. for sleep. Need physical exam for further refills  Patient taking differently: Take 100 mg by mouth at bedtime.   Merna Huxley, NP 10/06/2023 Active Self, Pharmacy Records            Home Care and Equipment/Supplies: Were Home Health Services Ordered?: NA Any new equipment or medical supplies ordered?: NA  Functional Questionnaire: Do you need assistance with bathing/showering or dressing?: No Do you need assistance with meal preparation?: No Do you need assistance with eating?: No Do you have difficulty maintaining continence: No Do you need assistance with getting out of bed/getting out of a chair/moving?: No Do you have difficulty managing or taking your medications?: No  Follow up appointments reviewed: PCP Follow-up appointment confirmed?: Yes Date of PCP follow-up appointment?: 10/11/23 Follow-up Provider: [facility] Follow-up appointment confirmed?: Yes Date of Specialist follow-up appointment?: 11/07/23 Follow-Up Specialty Provider:: Dr. Alexandro Do you need transportation to your follow-up appointment?: No (pt confirms she is able to drive herself to appts) Do you understand care options if your condition(s) worsen?: Yes-patient verbalized understanding  SDOH Interventions Today    Flowsheet Row Most Recent Value  SDOH Interventions   Food Insecurity Interventions Intervention Not Indicated  Housing Interventions Intervention Not Indicated  Transportation Interventions Intervention Not Indicated  Utilities Interventions Intervention Not Indicated      Interventions Today    Flowsheet Row Most Recent Value  Chronic Disease   Chronic disease during today's visit Chronic Obstructive Pulmonary Disease (COPD)  General Interventions   General Interventions Discussed/Reviewed General Interventions Discussed,  Durable Medical Equipment (DME), Vaccines, Doctor Visits  Vaccines COVID-19, Flu, Pneumonia  Doctor Visits Discussed/Reviewed Doctor Visits Discussed, PCP, Specialist  Durable Medical Equipment (DME) Oxygen   PCP/Specialist Visits Compliance with follow-up visit  Education Interventions   Education Provided Provided Education  Provided Verbal Education On Nutrition, When to see the doctor, Other  [resp sx mgmt]  Nutrition Interventions   Nutrition Discussed/Reviewed Nutrition Discussed  Pharmacy Interventions   Pharmacy Dicussed/Reviewed Pharmacy Topics Discussed, Medications and their functions  Safety Interventions   Safety Discussed/Reviewed Safety Discussed       TOC Interventions Today    Flowsheet Row Most Recent Value  TOC Interventions   TOC Interventions Discussed/Reviewed TOC Interventions Discussed       Rama Pilling, RN,BSN,CCM RN Care Manager Transitions of Care  [facility]  Direct Phone: [phone] Toll Free: [phone] Fax: [phone]

## 2023-10-11 ENCOUNTER — Inpatient Hospital Stay: Payer: No Typology Code available for payment source | Admitting: Adult Health

## 2023-10-17 ENCOUNTER — Encounter: Payer: Self-pay | Admitting: Adult Health

## 2023-10-17 ENCOUNTER — Ambulatory Visit: Payer: No Typology Code available for payment source | Admitting: Adult Health

## 2023-10-17 VITALS — BP 128/74 | HR 77 | Temp 97.4°F | Ht 64.0 in | Wt 161.0 lb

## 2023-10-17 DIAGNOSIS — J189 Pneumonia, unspecified organism: Secondary | ICD-10-CM

## 2023-10-17 DIAGNOSIS — R5383 Other fatigue: Secondary | ICD-10-CM | POA: Diagnosis not present

## 2023-10-17 DIAGNOSIS — K219 Gastro-esophageal reflux disease without esophagitis: Secondary | ICD-10-CM | POA: Diagnosis not present

## 2023-10-17 DIAGNOSIS — F419 Anxiety disorder, unspecified: Secondary | ICD-10-CM

## 2023-10-17 DIAGNOSIS — D649 Anemia, unspecified: Secondary | ICD-10-CM | POA: Diagnosis not present

## 2023-10-17 DIAGNOSIS — J9621 Acute and chronic respiratory failure with hypoxia: Secondary | ICD-10-CM

## 2023-10-17 DIAGNOSIS — Z85118 Personal history of other malignant neoplasm of bronchus and lung: Secondary | ICD-10-CM | POA: Diagnosis not present

## 2023-10-17 DIAGNOSIS — J449 Chronic obstructive pulmonary disease, unspecified: Secondary | ICD-10-CM | POA: Diagnosis not present

## 2023-10-17 LAB — BASIC METABOLIC PANEL
BUN: 25 mg/dL — ABNORMAL HIGH (ref 6–23)
CO2: 27 meq/L (ref 19–32)
Calcium: 9.7 mg/dL (ref 8.4–10.5)
Chloride: 98 meq/L (ref 96–112)
Creatinine, Ser: 0.98 mg/dL (ref 0.40–1.20)
GFR: 59.47 mL/min — ABNORMAL LOW (ref >60.00)
Glucose, Bld: 77 mg/dL (ref 70–99)
Potassium: 4 meq/L (ref 3.5–5.1)
Sodium: 137 meq/L (ref 135–145)

## 2023-10-17 LAB — CBC
HCT: 42.7 % (ref 36.0–46.0)
Hemoglobin: 13.7 g/dL (ref 12.0–15.0)
MCHC: 32.2 g/dL (ref 30.0–36.0)
MCV: 81.4 fL (ref 78.0–100.0)
Platelets: 393 10*3/uL (ref 150.0–400.0)
RBC: 5.25 Mil/uL — ABNORMAL HIGH (ref 3.87–5.11)
RDW: 14.2 % (ref 11.5–15.5)
WBC: 14.9 10*3/uL — ABNORMAL HIGH (ref 4.0–10.5)

## 2023-10-17 LAB — IBC + FERRITIN
Ferritin: 5.4 ng/mL — ABNORMAL LOW (ref 10.0–291.0)
Iron: 113 ug/dL (ref 42–145)
Saturation Ratios: 20.6 % (ref 20.0–50.0)
TIBC: 548.8 ug/dL — ABNORMAL HIGH (ref 250.0–450.0)
Transferrin: 392 mg/dL — ABNORMAL HIGH (ref 212.0–360.0)

## 2023-10-17 NOTE — Progress Notes (Signed)
Subjective:    Patient ID: [name], female    DOB: 06/07/1956, 68 y.o.   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  She presents to the office today for TCM visit  Admit Date 10/01/2023 Discharge Date 10/04/2023  She presented to the emergency room via EMS after EMS found her to be hypoxic.  She admitted to a productive cough but denied any fevers, chest pain, abdominal pain, nausea or vomiting.  In the ED her labs overall were unremarkable, no leukocytosis.  COVID, influenza, and RSV were negative.  Her chest x-ray showed opacity over the right middle chest possible pneumonia.  She was started on empiric IV antibiotics admitted to the hospital  Hospital Course  Acute on chronic hypoxic respiratory failure with CAP  -At home she is intermittently on 2 L via nasal cannula.  Strep pneumo and Legionella antigen were both negative. -started on IV Rocephin/azithromycin was switched to p.o. azithromycin and p.o. cefdinir to complete 5 days total of antibiotics -Continue supplemental O2 as needed  Possible COPD exacerbation  -Continue home nebulizers and inhalers -Had IV Solu-Medrol which was switched to p.o. prednisone taper - Follow up with pulmonary   Anxiety  - Continued on Klonopin and Paxil   GERD - Continued on PPI   History of Lung Cancer - Follow up with Pulmonary   Today in the office she reports that she finished her antibiotic for a PE and she took her last dose of prednisone yesterday.  She is feeling much better since being hospitalized.  She denies fevers, chills, nausea, vomiting, or productive cough.  She continues to use 2 L via nasal cannula as needed and reports that at rest her oxygen saturation is 97%.  With  walking her oxygen will drop to 91 to 93%.  She continues to feel fatigued and is having to take multiple naps throughout the day.  She did notice that she was slightly anemic in the hospital and would like her iron level checked today.   Review of Systems  Constitutional:  Positive for fatigue. Negative for chills and fever.  Respiratory:  Positive for cough (chronic) and shortness of breath (chronic). Negative for chest tightness, wheezing and stridor.   Cardiovascular: Negative.   Gastrointestinal: Negative.   Genitourinary: Negative.   Skin: Negative.   Neurological: Negative.   Psychiatric/Behavioral: Negative.     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    Hepatitis C    lung ca dx'd 03/2018   Tremor, unspecified    non-specific; takes inderal.     Social History   Socioeconomic History   Marital status: Widowed    Spouse name: Not on file   Number of children: Not on file   Years of education: Not on file   Highest education level: 12th grade  Occupational History   Not on file  Tobacco Use   Smoking status: Former    Current packs/day: 0.00  Average packs/day: 1 pack/day for 40.0 years (40.0 ttl pk-yrs)    Types: Cigarettes    Start date: 12/14/1977    Quit date: 12/14/2017    Years since quitting: 5.8    Passive exposure: Never   Smokeless tobacco: Never  Vaping Use   Vaping status: Never Used  Substance and Sexual Activity   Alcohol use: Yes    Alcohol/week: 7.0 standard drinks of alcohol    Types: 7 Shots of liquor per week    Comment: occasional bourbon   Drug use: Not Currently   Sexual activity: Never  Other Topics Concern   Not on file  Social History Narrative   Not on file   Social  Drivers of Health   Financial Resource Strain: Low Risk  (08/15/2022)   Overall Financial Resource Strain (CARDIA)    Difficulty of Paying Living Expenses: Not hard at all  Food Insecurity: No Food Insecurity (10/07/2023)   Hunger Vital Sign    Worried About Running Out of Food in the Last Year: Never true    Ran Out of Food in the Last Year: Never true  Transportation Needs: No Transportation Needs (10/07/2023)   PRAPARE - Administrator, Civil Service (Medical): No    Lack of Transportation (Non-Medical): No  Physical Activity: Insufficiently Active (08/15/2022)   Exercise Vital Sign    Days of Exercise per Week: 1 day    Minutes of Exercise per Session: 10 min  Stress: No Stress Concern Present (08/15/2022)   [employer] of Occupational Health - Occupational Stress Questionnaire    Feeling of Stress : Not at all  Social Connections: Moderately Isolated (10/01/2023)   Social Connection and Isolation Panel [NHANES]    Frequency of Communication with Friends and Family: Twice a week    Frequency of Social Gatherings with Friends and Family: Once a week    Attends Religious Services: 1 to 4 times per year    Active Member of [employer] or Organizations: No    Attends Banker Meetings: Never    Marital Status: Widowed  Intimate Partner Violence: Not At Risk (10/07/2023)   Humiliation, Afraid, Rape, and Kick questionnaire    Fear of Current or Ex-Partner: No    Emotionally Abused: No    Physically Abused: No    Sexually Abused: No    Past Surgical History:  Procedure Laterality Date   ABDOMINAL HYSTERECTOMY  1986   menorrhagia; hx of cryo for abnormal paps   APPENDECTOMY     removed in 1986 per pt   BIOPSY  10/31/2022   Procedure: BIOPSY;  Surgeon: Lemar Lofty., MD;  Location: [facility];  Service: Gastroenterology;;   CATARACT EXTRACTION Bilateral 07/2022   ESOPHAGOGASTRODUODENOSCOPY (EGD) WITH PROPOFOL N/A 10/31/2022   Procedure:  ESOPHAGOGASTRODUODENOSCOPY (EGD) WITH PROPOFOL;  Surgeon: Lemar Lofty., MD;  Location: [facility];  Service: Gastroenterology;  Laterality: N/A;   INTERCOSTAL NERVE BLOCK Right 12/18/2019   Procedure: Intercostal Nerve Block;  Surgeon: Loreli Slot, MD;  Location: [facility] OR;  Service: Thoracic;  Laterality: Right;   LOBECTOMY Left 03/13/2018   Procedure: Lingula section of left upper lobe lobectomy;  Surgeon: Loreli Slot, MD;  Location: [facility] OR;  Service: Thoracic;  Laterality: Left;   NODE DISSECTION  12/18/2019   Procedure: Node Dissection;  Surgeon: Loreli Slot, MD;  Location: [facility] OR;  Service: Thoracic;;   SAVORY DILATION N/A 10/31/2022   Procedure: Jacklyn Shell;  Surgeon: Lemar Lofty., MD;  Location: [facility];  Service: Gastroenterology;  Laterality: N/A;   VIDEO ASSISTED THORACOSCOPY (VATS)/WEDGE RESECTION Left 03/13/2018   Procedure: VIDEO ASSISTED THORACOSCOPY (VATS)/WEDGE RESECTION;  Surgeon: Loreli Slot, MD;  Location: [facility] OR;  Service: Thoracic;  Laterality: Left;    Family History  Problem Relation Age of Onset   Asthma Mother    Alzheimer's disease Mother 50   Hypertension Mother    Other Father        shot    Neuropathy Sister        face   Cancer Maternal Aunt        breast   Cancer Paternal Aunt    Lung cancer Paternal Uncle    Cancer Paternal Grandmother        abdominal   Colon cancer Neg Hx    Esophageal cancer Neg Hx    Inflammatory bowel disease Neg Hx    Liver disease Neg Hx    Pancreatic cancer Neg Hx    Rectal cancer Neg Hx    Stomach cancer Neg Hx     Allergies  Allergen Reactions   Aspirin Shortness Of Breath   Trelegy Ellipta [Fluticasone-Umeclidin-Vilant] Shortness Of Breath    She reports that is made her cough worse.    Morphine Sulfate Other (See Comments)    RED STREAKS ON ARMS.   Oxycodone Itching and Rash   Hydrocodone Itching   Glycopyrrolate Other (See Comments)     UNSPECIFIED REACTION  pt unsure if she is allergic.   Lactose Intolerance (Gi) Nausea And Vomiting    Milk only.Patient states that she can eat cheese.   Penicillins Other (See Comments)     Heartburn, upset stomach only & tolerated AMOXIL after this reaction   Sulfa Antibiotics Tinitus    Current Outpatient Medications on File Prior to Visit  Medication Sig Dispense Refill   albuterol (PROVENTIL) (2.5 MG/3ML) 0.083% nebulizer solution Take 3 mLs (2.5 mg total) by nebulization every 4 (four) hours as needed for wheezing or shortness of breath. 75 mL 12   albuterol (VENTOLIN HFA) 108 (90 Base) MCG/ACT inhaler Inhale 2 puffs into the lungs every 6 (six) hours as needed for wheezing or shortness of breath. 8 g 0   Budeson-Glycopyrrol-Formoterol (BREZTRI AEROSPHERE) 160-9-4.8 MCG/ACT AERO Inhale 2 puffs into the lungs 2 (two) times daily. (Patient taking differently: Inhale 1 puff into the lungs 2 (two) times daily.) 3 each 4   clonazePAM (KLONOPIN) 0.5 MG tablet TAKE 1 TABLET BY MOUTH THREE TIMES DAILY AS NEEDED FOR ANXIETY (Patient taking differently: Take 0.5 mg by mouth at bedtime. for anxiety) 90 tablet 0   esomeprazole (NEXIUM) 40 MG capsule Take 1 capsule (40 mg total) by mouth daily. (Patient taking differently: Take 40 mg by mouth in the morning and at bedtime.) 60 capsule 11   guaiFENesin (MUCINEX) 600 MG 12 hr tablet Take 600 mg by mouth daily.     PARoxetine (PAXIL) 40 MG tablet Take 1 tablet by mouth once daily (Patient taking differently: Take 40 mg by mouth at bedtime.) 90 tablet 0   propranolol ER (INDERAL LA) 60 MG 24 hr capsule Take 1 capsule by mouth once daily (Patient taking differently: Take 60 mg by mouth at bedtime.) 90 capsule 0   traZODone (DESYREL) 100 MG tablet Take 1 tablet (100 mg total) by mouth at bedtime as needed. for sleep. Need physical exam for further refills (Patient taking differently: Take 100 mg by mouth at bedtime.) 30 tablet 0  No current  facility-administered medications on file prior to visit.    BP 128/74   Pulse 77   Temp (!) 97.4 F (36.3 C) (Oral)   Ht 5\' 4"  (1.626 m)   Wt 161 lb (73 kg)   SpO2 97%   BMI 27.64 kg/m       Objective:   Physical Exam Constitutional:      Appearance: Normal appearance.  Cardiovascular:     Rate and Rhythm: Normal rate and regular rhythm.     Pulses: Normal pulses.     Heart sounds: Normal heart sounds.  Pulmonary:     Effort: Pulmonary effort is normal.     Breath sounds: Normal breath sounds. No wheezing, rhonchi or rales.  Musculoskeletal:        General: Normal range of motion.  Skin:    General: Skin is warm and dry.  Neurological:     General: No focal deficit present.     Mental Status: She is alert and oriented to person, place, and time.  Psychiatric:        Mood and Affect: Mood normal.        Behavior: Behavior normal.        Thought Content: Thought content normal.        Judgment: Judgment normal.        Assessment & Plan:  1. Acute on chronic hypoxic respiratory failure (HCC) (Primary) -Reviewed hospital notes, discharge instructions, labs, imaging, and medication changes.  All questions answered to the best of my ability.  Patient appears to be doing better since being discharged.  Will have her follow-up with pulmonary as directed.  Recheck CBC and BMP today - CBC; Future - Basic Metabolic Panel; Future - Basic Metabolic Panel - CBC  2. Community acquired pneumonia of right middle lobe of lung - Follow up xray with Pulmonary  - CBC; Future - Basic Metabolic Panel; Future - Basic Metabolic Panel - CBC  3. COPD GOLD 3  - Continue home treatment  - CBC; Future - Basic Metabolic Panel; Future - Basic Metabolic Panel - CBC  4. Anxiety - Continue klonopin and Paxil   5. Other fatigue -Fatigue likely from illness.  Will check iron panel since she was slightly anemic in the hospital. - CBC; Future - Basic Metabolic Panel; Future - IBC +  Ferritin; Future - IBC + Ferritin - Basic Metabolic Panel - CBC  6. Gastroesophageal reflux disease, unspecified whether esophagitis present - Continue PPI  7. History of lung cancer - Per pulmonary   8. Anemia, unspecified type - Consider iron supplement  - CBC; Future - Basic Metabolic Panel; Future - IBC + Ferritin; Future - IBC + Ferritin - Basic Metabolic Panel - CBC  Shirline Frees, NP

## 2023-10-18 ENCOUNTER — Encounter: Payer: Self-pay | Admitting: Adult Health

## 2023-10-18 DIAGNOSIS — F321 Major depressive disorder, single episode, moderate: Secondary | ICD-10-CM

## 2023-10-18 DIAGNOSIS — R251 Tremor, unspecified: Secondary | ICD-10-CM

## 2023-10-18 DIAGNOSIS — F32A Depression, unspecified: Secondary | ICD-10-CM

## 2023-10-21 ENCOUNTER — Encounter: Payer: Self-pay | Admitting: Adult Health

## 2023-10-21 IMAGING — CT CT CHEST W/ CM
2 of 4 series · 14 of 36 positions shown, 17 images · IV contrast (agent unspecified)
Comparison: Chest two views 01/24/2022 and 10/27/2021;

CLINICAL DATA: Non-small-cell lung cancer. Staging. 65-year-old
female former smoker with 40 pack-year smoking history followed for
COPD.

EXAM:
CT CHEST WITH CONTRAST
TECHNIQUE: Multidetector CT imaging of the chest was performed during
intravenous contrast administration.

[Series 2: axial st · axial · 0.63mm/px · z∈[+3,+239]mm · 11 of 140 slices shown, 14 images]
[im 11/140  mediastinal]
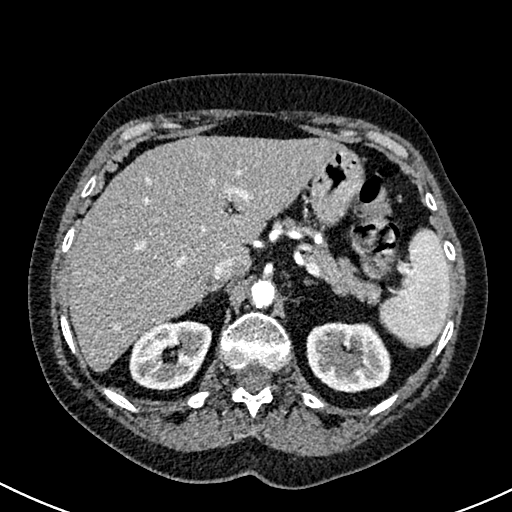
[im 11/140  lung]
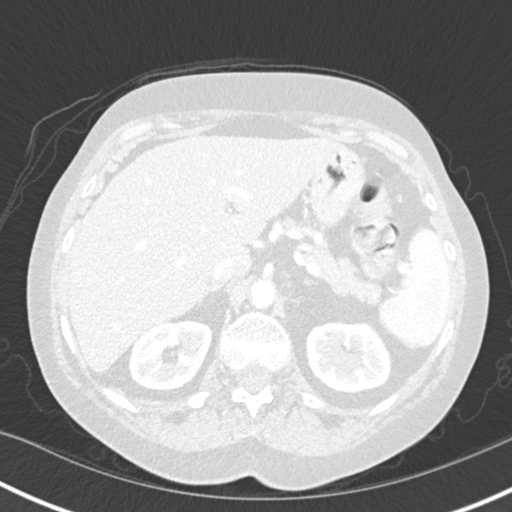
[im 22/140  lung]
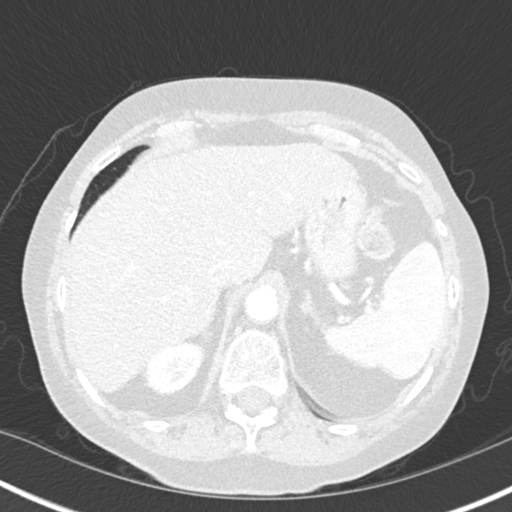
[im 33/140  lung]
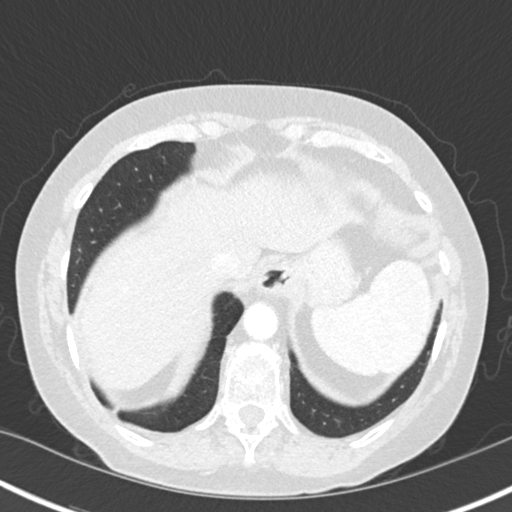
[im 43/140  lung]
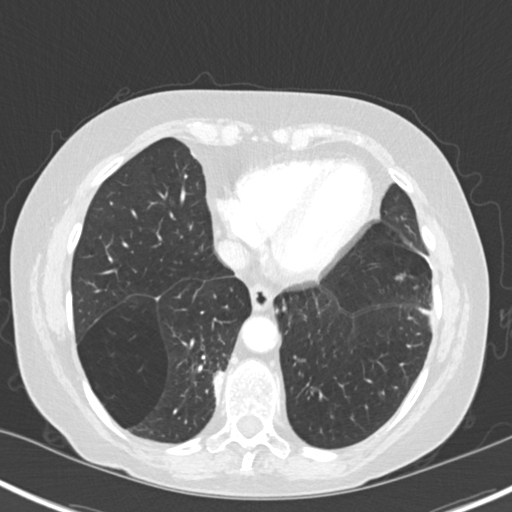
[im 54/140  mediastinal]
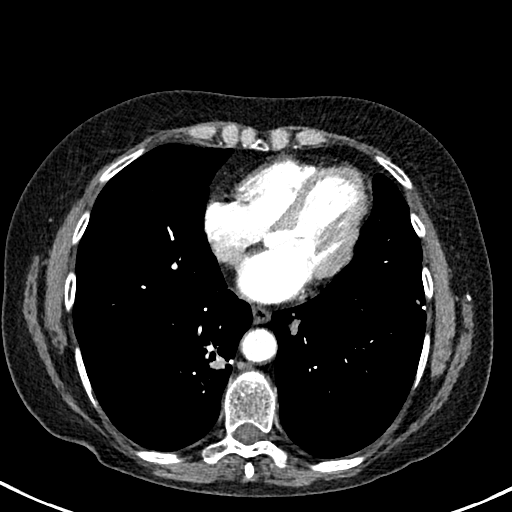
[im 54/140  lung]
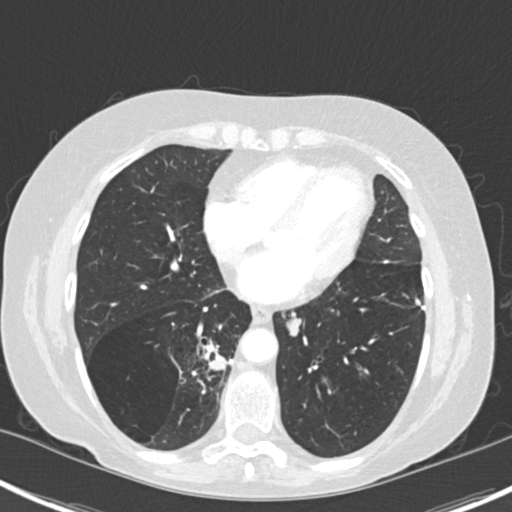
[im 75/140  lung]
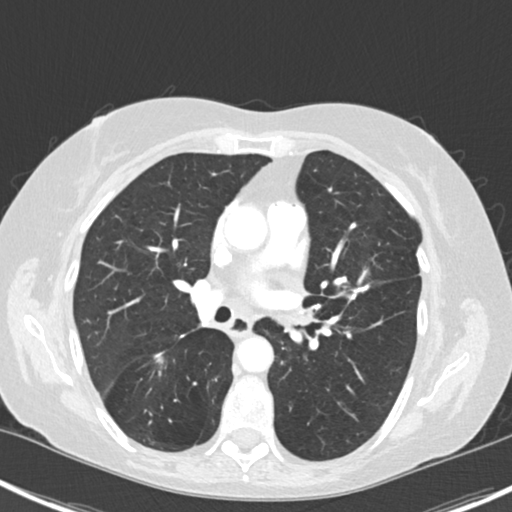
[im 86/140  lung]
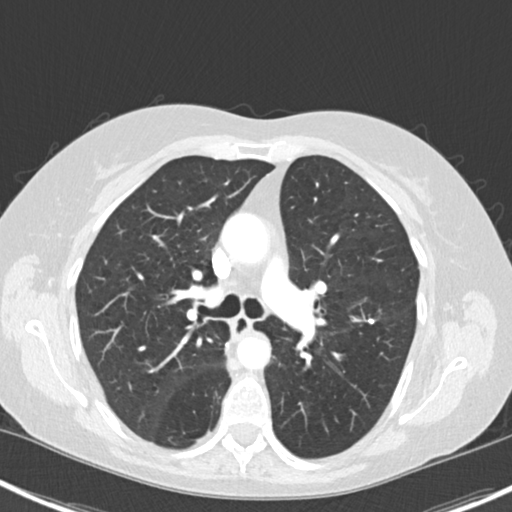
[im 97/140  lung]
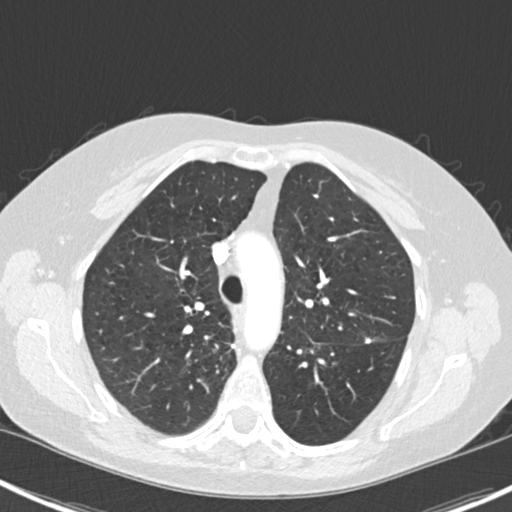
[im 107/140  mediastinal]
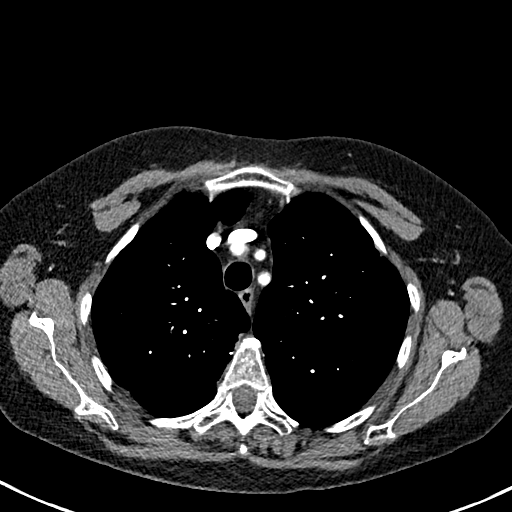
[im 107/140  lung]
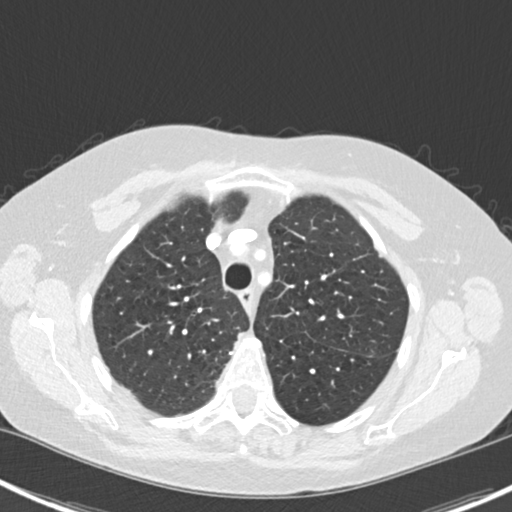
[im 118/140  lung]
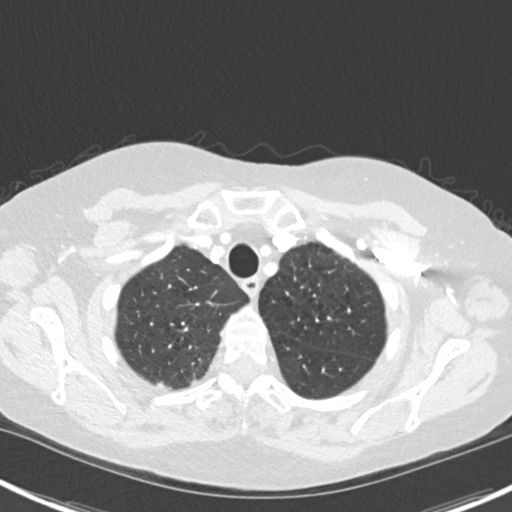
[im 129/140  lung]
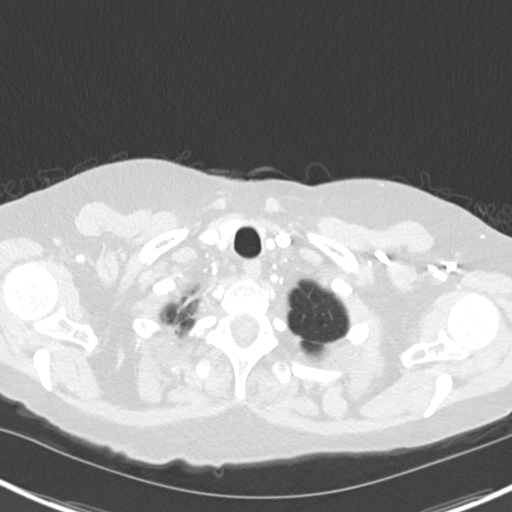

[Series 6: coronal · coronal · 0.69mm/px · 3 of 116 slices shown]
[im 24/116  lung]
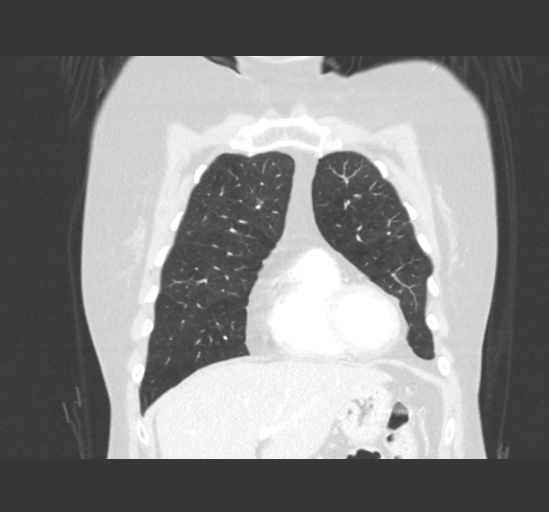
[im 47/116  lung]
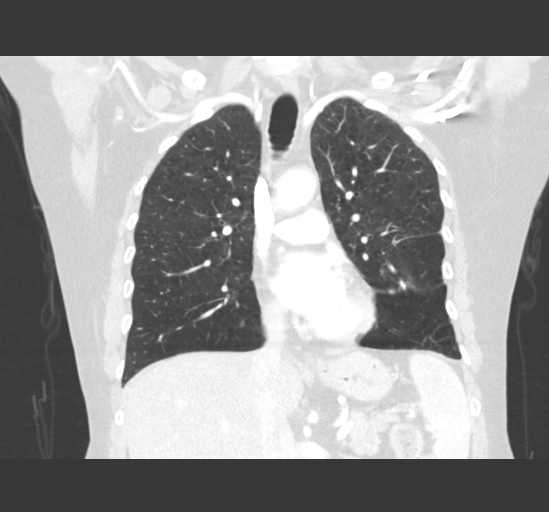
[im 70/116  lung]
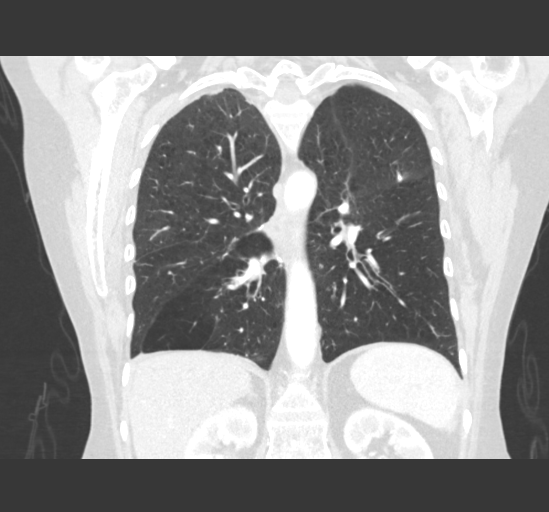

[14 of 36 positions shown; findings below may reference images not displayed]

RADIATION DOSE REDUCTION: This exam was performed according to the
departmental dose-optimization program which includes automated
exposure control, adjustment of the mA and/or kV according to
patient size and/or use of iterative reconstruction technique.

CONTRAST:  75mL OMNIPAQUE IOHEXOL 300 MG/ML  SOLN
CTA chest
09/18/2021; CT chest without contrast 04/10/2021

There is new lobular nodular opacification of the medial left lower
lobe
FINDINGS: Cardiovascular: Heart size is normal. The main pulmonary artery is
opacified up to 401 Hounsfield units. No pulmonary embolism is
visualized to the segmental level on the provided 2 mm slice
thickness images. No thoracic aortic aneurysm. Mild calcifications
within the aortic arch.

Mediastinum/Nodes: No axillary, mediastinal, or hilar pathologically
enlarged lymph nodes by CT criteria. The visualized thyroid is
unremarkable. Unchanged minimal possible sliding hiatal hernia.

Lungs/Pleura: The central airways are patent. Moderate centrilobular
emphysematous changes are similar to prior. Resolution of the prior
posterior right upper lobe ground-glass and heterogeneous airspace
opacities.

Surgical suture is again seen along the posteromedial aspect of the
left upper lobe and posteromedial aspect of the right lower lobe.
Mild right apical scarring is similar to prior.

There is again linear soft tissue thickening/scarring along the
inferior medial right lower lobe suture line. There is again lucency
and possible bullous change within the lateral aspect of the right
lower lobe.

There is new inferior medial left lower lobe nodularity in a region
measuring up to approximately 10 x 12 x 15 mm (transverse by AP by
craniocaudal), best seen on axial series 5, image 82 and sagittal
series 7, image 85. This appears to be along the pulmonary
vasculature in this region.

A 7 mm ground-glass density within the posterior right upper lobe
(axial series 5, image 47 and sagittal series 7, image 37) is
unchanged from 04/10/2021. This is also reportedly stable from older
CTs.

No pleural effusion or pneumothorax.

Upper Abdomen: No acute abnormality.

Musculoskeletal: Moderate multilevel degenerative disc changes of
the midthoracic spine. No aggressive lytic or blastic osseous lesion
is seen.
IMPRESSION: 1. Stable bilateral lung postoperative changes.
2. Resolution of the prior posterior right upper lobe heterogeneous
airspace opacification seen on 09/18/2021 CT.
3. New nodularity around pulmonary vasculature within the medial
left lower lobe measuring up to 15 mm in craniocaudal dimension.
Cannot exclude soft tissue nodularity of malignancy. Consider
follow-up CT in 3 months to assess for interval change.

Aortic Atherosclerosis (05EJC-JR6.6) and Emphysema (05EJC-CFE.T).

## 2023-10-22 ENCOUNTER — Telehealth: Payer: Self-pay | Admitting: Adult Health

## 2023-10-22 ENCOUNTER — Other Ambulatory Visit: Payer: Self-pay | Admitting: Adult Health

## 2023-10-22 ENCOUNTER — Encounter: Payer: Self-pay | Admitting: Adult Health

## 2023-10-22 DIAGNOSIS — F32A Depression, unspecified: Secondary | ICD-10-CM

## 2023-10-22 DIAGNOSIS — F321 Major depressive disorder, single episode, moderate: Secondary | ICD-10-CM

## 2023-10-22 DIAGNOSIS — R251 Tremor, unspecified: Secondary | ICD-10-CM

## 2023-10-22 MED ORDER — TRAZODONE HCL 100 MG PO TABS: 100.0000 mg | ORAL_TABLET | Freq: Every evening | ORAL | 0 refills | Status: DC | PRN

## 2023-10-22 MED ORDER — CLONAZEPAM 0.5 MG PO TABS: 0.5000 mg | ORAL_TABLET | Freq: Three times a day (TID) | ORAL | 3 refills | Status: DC | PRN

## 2023-10-22 MED ORDER — PAROXETINE HCL 40 MG PO TABS: 40.0000 mg | ORAL_TABLET | Freq: Every day | ORAL | 0 refills | Status: DC

## 2023-10-22 MED ORDER — PROPRANOLOL HCL ER 60 MG PO CP24: 60.0000 mg | ORAL_CAPSULE | Freq: Every day | ORAL | 0 refills | Status: DC

## 2023-10-22 MED ORDER — PROPRANOLOL HCL 20 MG PO TABS: ORAL_TABLET | ORAL | 0 refills | Status: DC

## 2023-10-22 NOTE — Telephone Encounter (Signed)
Please to medications to patient listed wrong pharm in her message [id number] [place], [place] - [address] Phone: [phone]  Fax: [phone]

## 2023-10-22 NOTE — Telephone Encounter (Signed)
Okay for refill?  

## 2023-10-22 NOTE — Telephone Encounter (Signed)
Please see note below.   Copied from CRM [id number]. Topic: Clinical - Prescription Issue >> Oct 22, 2023  8:42 AM Josefa Half C wrote: Reason for CRM: Patient has requested a call back from a nurse because she has requested for the provider to transfer all of her medications to her new preferred pharmacy [facility]- [address], [place], [place] [address] [phone]  Please follow up with patient #[phone]

## 2023-10-22 NOTE — Telephone Encounter (Unsigned)
Copied from CRM [id number]. Topic: Clinical - Prescription Issue >> Oct 22, 2023  8:42 AM Josefa Half C wrote: Reason for CRM: Patient has requested a call back from a nurse because she has requested for the provider to transfer all of her medications to her new preferred pharmacy [facility]- [address] [place], [place] [address] [phone]  Please follow up with patient #[phone]

## 2023-10-27 ENCOUNTER — Emergency Department (HOSPITAL_COMMUNITY): Payer: No Typology Code available for payment source

## 2023-10-27 ENCOUNTER — Inpatient Hospital Stay (HOSPITAL_COMMUNITY)
Admission: EM | Admit: 2023-10-27 | Discharge: 2023-10-31 | DRG: 194 | Disposition: A | Discharge status: Home Health | Payer: No Typology Code available for payment source | Attending: Family Medicine | Admitting: Family Medicine

## 2023-10-27 ENCOUNTER — Encounter (HOSPITAL_COMMUNITY): Payer: Self-pay

## 2023-10-27 ENCOUNTER — Other Ambulatory Visit: Payer: Self-pay

## 2023-10-27 DIAGNOSIS — Z85118 Personal history of other malignant neoplasm of bronchus and lung: Secondary | ICD-10-CM

## 2023-10-27 DIAGNOSIS — M19041 Primary osteoarthritis, right hand: Secondary | ICD-10-CM | POA: Diagnosis present

## 2023-10-27 DIAGNOSIS — Z825 Family history of asthma and other chronic lower respiratory diseases: Secondary | ICD-10-CM

## 2023-10-27 DIAGNOSIS — Z87891 Personal history of nicotine dependence: Secondary | ICD-10-CM

## 2023-10-27 DIAGNOSIS — F32A Depression, unspecified: Secondary | ICD-10-CM | POA: Diagnosis present

## 2023-10-27 DIAGNOSIS — J440 Chronic obstructive pulmonary disease with acute lower respiratory infection: Principal | ICD-10-CM | POA: Diagnosis present

## 2023-10-27 DIAGNOSIS — E739 Lactose intolerance, unspecified: Secondary | ICD-10-CM | POA: Diagnosis present

## 2023-10-27 DIAGNOSIS — Z820 Family history of epilepsy and other diseases of the nervous system: Secondary | ICD-10-CM

## 2023-10-27 DIAGNOSIS — Z1152 Encounter for screening for COVID-19: Secondary | ICD-10-CM

## 2023-10-27 DIAGNOSIS — K449 Diaphragmatic hernia without obstruction or gangrene: Secondary | ICD-10-CM | POA: Diagnosis present

## 2023-10-27 DIAGNOSIS — J984 Other disorders of lung: Secondary | ICD-10-CM | POA: Diagnosis not present

## 2023-10-27 DIAGNOSIS — Z888 Allergy status to other drugs, medicaments and biological substances status: Secondary | ICD-10-CM

## 2023-10-27 DIAGNOSIS — Z801 Family history of malignant neoplasm of trachea, bronchus and lung: Secondary | ICD-10-CM

## 2023-10-27 DIAGNOSIS — J189 Pneumonia, unspecified organism: Secondary | ICD-10-CM | POA: Diagnosis not present

## 2023-10-27 DIAGNOSIS — R0602 Shortness of breath: Secondary | ICD-10-CM | POA: Diagnosis not present

## 2023-10-27 DIAGNOSIS — I700 Atherosclerosis of aorta: Secondary | ICD-10-CM | POA: Diagnosis not present

## 2023-10-27 DIAGNOSIS — Z79899 Other long term (current) drug therapy: Secondary | ICD-10-CM

## 2023-10-27 DIAGNOSIS — Z8616 Personal history of COVID-19: Secondary | ICD-10-CM

## 2023-10-27 DIAGNOSIS — Z8701 Personal history of pneumonia (recurrent): Secondary | ICD-10-CM

## 2023-10-27 DIAGNOSIS — Z885 Allergy status to narcotic agent status: Secondary | ICD-10-CM

## 2023-10-27 DIAGNOSIS — R918 Other nonspecific abnormal finding of lung field: Secondary | ICD-10-CM | POA: Diagnosis not present

## 2023-10-27 DIAGNOSIS — Z8249 Family history of ischemic heart disease and other diseases of the circulatory system: Secondary | ICD-10-CM

## 2023-10-27 DIAGNOSIS — Z902 Acquired absence of lung [part of]: Secondary | ICD-10-CM

## 2023-10-27 DIAGNOSIS — J9611 Chronic respiratory failure with hypoxia: Secondary | ICD-10-CM | POA: Diagnosis present

## 2023-10-27 DIAGNOSIS — G250 Essential tremor: Secondary | ICD-10-CM | POA: Diagnosis present

## 2023-10-27 DIAGNOSIS — J9601 Acute respiratory failure with hypoxia: Secondary | ICD-10-CM | POA: Diagnosis not present

## 2023-10-27 DIAGNOSIS — D649 Anemia, unspecified: Secondary | ICD-10-CM | POA: Diagnosis present

## 2023-10-27 DIAGNOSIS — F419 Anxiety disorder, unspecified: Secondary | ICD-10-CM | POA: Diagnosis present

## 2023-10-27 DIAGNOSIS — M19042 Primary osteoarthritis, left hand: Secondary | ICD-10-CM | POA: Diagnosis present

## 2023-10-27 DIAGNOSIS — J441 Chronic obstructive pulmonary disease with (acute) exacerbation: Principal | ICD-10-CM | POA: Diagnosis present

## 2023-10-27 DIAGNOSIS — J439 Emphysema, unspecified: Secondary | ICD-10-CM | POA: Diagnosis not present

## 2023-10-27 DIAGNOSIS — Z886 Allergy status to analgesic agent status: Secondary | ICD-10-CM

## 2023-10-27 DIAGNOSIS — R0603 Acute respiratory distress: Secondary | ICD-10-CM | POA: Diagnosis not present

## 2023-10-27 DIAGNOSIS — Z90710 Acquired absence of both cervix and uterus: Secondary | ICD-10-CM

## 2023-10-27 DIAGNOSIS — Z808 Family history of malignant neoplasm of other organs or systems: Secondary | ICD-10-CM

## 2023-10-27 DIAGNOSIS — R0902 Hypoxemia: Secondary | ICD-10-CM | POA: Diagnosis not present

## 2023-10-27 DIAGNOSIS — J168 Pneumonia due to other specified infectious organisms: Secondary | ICD-10-CM | POA: Diagnosis not present

## 2023-10-27 DIAGNOSIS — Z9981 Dependence on supplemental oxygen: Secondary | ICD-10-CM

## 2023-10-27 DIAGNOSIS — C3490 Malignant neoplasm of unspecified part of unspecified bronchus or lung: Secondary | ICD-10-CM | POA: Diagnosis not present

## 2023-10-27 DIAGNOSIS — G43909 Migraine, unspecified, not intractable, without status migrainosus: Secondary | ICD-10-CM | POA: Diagnosis present

## 2023-10-27 DIAGNOSIS — Z803 Family history of malignant neoplasm of breast: Secondary | ICD-10-CM

## 2023-10-27 DIAGNOSIS — Z882 Allergy status to sulfonamides status: Secondary | ICD-10-CM

## 2023-10-27 DIAGNOSIS — Z880 Allergy status to penicillin: Secondary | ICD-10-CM

## 2023-10-27 DIAGNOSIS — Z66 Do not resuscitate: Secondary | ICD-10-CM | POA: Diagnosis present

## 2023-10-27 DIAGNOSIS — Z7951 Long term (current) use of inhaled steroids: Secondary | ICD-10-CM

## 2023-10-27 DIAGNOSIS — I050 Rheumatic mitral stenosis: Secondary | ICD-10-CM | POA: Diagnosis present

## 2023-10-27 LAB — BLOOD GAS, VENOUS
Acid-Base Excess: 2.9 mmol/L — ABNORMAL HIGH (ref 0.0–2.0)
Bicarbonate: 29.4 mmol/L — ABNORMAL HIGH (ref 20.0–28.0)
O2 Saturation: 68.9 %
Patient temperature: 37
pCO2, Ven: 52 mm[Hg] (ref 44–60)
pH, Ven: 7.36 (ref 7.25–7.43)
pO2, Ven: 37 mm[Hg] (ref 32–45)

## 2023-10-27 LAB — COMPREHENSIVE METABOLIC PANEL
ALT: 21 U/L (ref 0–44)
AST: 21 U/L (ref 15–41)
Albumin: 3.5 g/dL (ref 3.5–5.0)
Alkaline Phosphatase: 81 U/L (ref 38–126)
Anion gap: 10 (ref 5–15)
BUN: 13 mg/dL (ref 8–23)
CO2: 24 mmol/L (ref 22–32)
Calcium: 8.8 mg/dL — ABNORMAL LOW (ref 8.9–10.3)
Chloride: 104 mmol/L (ref 98–111)
Creatinine, Ser: 0.87 mg/dL (ref 0.44–1.00)
GFR, Estimated: >60 mL/min (ref >60)
Glucose, Bld: 132 mg/dL — ABNORMAL HIGH (ref 70–99)
Potassium: 4 mmol/L (ref 3.5–5.1)
Sodium: 138 mmol/L (ref 135–145)
Total Bilirubin: 0.7 mg/dL (ref 0.0–1.2)
Total Protein: 6.6 g/dL (ref 6.5–8.1)

## 2023-10-27 LAB — CBC
HCT: 35.5 % — ABNORMAL LOW (ref 36.0–46.0)
Hemoglobin: 11.2 g/dL — ABNORMAL LOW (ref 12.0–15.0)
MCH: 26.5 pg (ref 26.0–34.0)
MCHC: 31.5 g/dL (ref 30.0–36.0)
MCV: 83.9 fL (ref 80.0–100.0)
Platelets: 211 10*3/uL (ref 150–400)
RBC: 4.23 MIL/uL (ref 3.87–5.11)
RDW: 13.9 % (ref 11.5–15.5)
WBC: 10.1 10*3/uL (ref 4.0–10.5)
nRBC: 0 % (ref 0.0–0.2)

## 2023-10-27 LAB — BRAIN NATRIURETIC PEPTIDE: B Natriuretic Peptide: 104.2 pg/mL — ABNORMAL HIGH (ref 0.0–100.0)

## 2023-10-27 LAB — RESP PANEL BY RT-PCR (RSV, FLU A&B, COVID)  RVPGX2
Influenza A by PCR: NEGATIVE
Influenza B by PCR: NEGATIVE
Resp Syncytial Virus by PCR: NEGATIVE
SARS Coronavirus 2 by RT PCR: NEGATIVE

## 2023-10-27 LAB — MAGNESIUM: Magnesium: 2.8 mg/dL — ABNORMAL HIGH (ref 1.7–2.4)

## 2023-10-27 LAB — LACTIC ACID, PLASMA: Lactic Acid, Venous: 1.7 mmol/L (ref 0.5–1.9)

## 2023-10-27 MED ORDER — LACTATED RINGERS IV BOLUS (SEPSIS)
1000.0000 mL | Freq: Once | INTRAVENOUS | Status: AC
  Administered 2023-10-27: 1000 mL via INTRAVENOUS

## 2023-10-27 MED ORDER — LACTATED RINGERS IV BOLUS (SEPSIS)
250.0000 mL | Freq: Once | INTRAVENOUS | Status: AC
  Administered 2023-10-27: 250 mL via INTRAVENOUS

## 2023-10-27 MED ORDER — LACTATED RINGERS IV SOLN: INTRAVENOUS | Status: DC

## 2023-10-27 MED ORDER — IOHEXOL 350 MG/ML SOLN
75.0000 mL | Freq: Once | INTRAVENOUS | Status: AC | PRN
  Administered 2023-10-27: 75 mL via INTRAVENOUS

## 2023-10-27 MED ORDER — PREDNISONE 20 MG PO TABS
40.0000 mg | ORAL_TABLET | Freq: Every day | ORAL | Status: AC
  Administered 2023-10-28 – 2023-10-31 (×4): 40 mg via ORAL
  Filled 2023-10-27 (×4): qty 2

## 2023-10-27 MED ORDER — ENOXAPARIN SODIUM 40 MG/0.4ML IJ SOSY
40.0000 mg | PREFILLED_SYRINGE | INTRAMUSCULAR | Status: DC
Start: 2023-10-27 — End: 2023-10-31
  Administered 2023-10-27 – 2023-10-30 (×4): 40 mg via SUBCUTANEOUS
  Filled 2023-10-27 (×4): qty 0.4

## 2023-10-27 MED ORDER — UMECLIDINIUM BROMIDE 62.5 MCG/ACT IN AEPB
1.0000 | INHALATION_SPRAY | Freq: Every day | RESPIRATORY_TRACT | Status: DC
  Administered 2023-10-28 – 2023-10-31 (×4): 1 via RESPIRATORY_TRACT
  Filled 2023-10-27: qty 7

## 2023-10-27 MED ORDER — ACETAMINOPHEN 500 MG PO TABS: 1000.0000 mg | ORAL_TABLET | Freq: Once | ORAL | Status: AC

## 2023-10-27 MED ORDER — CLONAZEPAM 0.5 MG PO TABS: 0.5000 mg | ORAL_TABLET | Freq: Three times a day (TID) | ORAL | Status: DC | PRN

## 2023-10-27 MED ORDER — SODIUM CHLORIDE 0.9 % IV SOLN
2.0000 g | Freq: Once | INTRAVENOUS | Status: AC
  Administered 2023-10-27: 2 g via INTRAVENOUS
  Filled 2023-10-27: qty 20

## 2023-10-27 MED ORDER — PANTOPRAZOLE SODIUM 40 MG PO TBEC
40.0000 mg | DELAYED_RELEASE_TABLET | Freq: Every day | ORAL | Status: DC
  Administered 2023-10-27 – 2023-10-31 (×5): 40 mg via ORAL
  Filled 2023-10-27 (×5): qty 1

## 2023-10-27 MED ORDER — IPRATROPIUM-ALBUTEROL 0.5-2.5 (3) MG/3ML IN SOLN
3.0000 mL | RESPIRATORY_TRACT | Status: DC | PRN
  Administered 2023-10-28 – 2023-10-29 (×4): 3 mL via RESPIRATORY_TRACT
  Filled 2023-10-27 (×4): qty 3

## 2023-10-27 MED ORDER — SODIUM CHLORIDE 0.9 % IV SOLN
2.0000 g | INTRAVENOUS | Status: AC
  Administered 2023-10-28 – 2023-10-31 (×4): 2 g via INTRAVENOUS
  Filled 2023-10-27 (×4): qty 20

## 2023-10-27 MED ORDER — METHYLPREDNISOLONE SODIUM SUCC 125 MG IJ SOLR
125.0000 mg | Freq: Once | INTRAMUSCULAR | Status: AC
  Administered 2023-10-27: 125 mg via INTRAVENOUS
  Filled 2023-10-27: qty 2

## 2023-10-27 MED ORDER — TRAZODONE HCL 100 MG PO TABS
100.0000 mg | ORAL_TABLET | Freq: Every day | ORAL | Status: DC
  Administered 2023-10-27 – 2023-10-30 (×4): 100 mg via ORAL
  Filled 2023-10-27 (×4): qty 1

## 2023-10-27 MED ORDER — AZITHROMYCIN 250 MG PO TABS
500.0000 mg | ORAL_TABLET | Freq: Every day | ORAL | Status: AC
  Administered 2023-10-28 – 2023-10-31 (×4): 500 mg via ORAL
  Filled 2023-10-27 (×4): qty 2

## 2023-10-27 MED ORDER — MAGNESIUM SULFATE 2 GM/50ML IV SOLN
2.0000 g | Freq: Once | INTRAVENOUS | Status: AC
  Administered 2023-10-27: 2 g via INTRAVENOUS
  Filled 2023-10-27: qty 50

## 2023-10-27 MED ORDER — FLUTICASONE FUROATE-VILANTEROL 100-25 MCG/ACT IN AEPB
1.0000 | INHALATION_SPRAY | Freq: Every day | RESPIRATORY_TRACT | Status: DC
  Administered 2023-10-28 – 2023-10-31 (×4): 1 via RESPIRATORY_TRACT
  Filled 2023-10-27: qty 28

## 2023-10-27 MED ORDER — ALBUTEROL SULFATE (2.5 MG/3ML) 0.083% IN NEBU
10.0000 mg/h | INHALATION_SOLUTION | Freq: Once | RESPIRATORY_TRACT | Status: AC
  Administered 2023-10-27: 10 mg/h via RESPIRATORY_TRACT
  Filled 2023-10-27: qty 3

## 2023-10-27 MED ORDER — PAROXETINE HCL 20 MG PO TABS
40.0000 mg | ORAL_TABLET | Freq: Every day | ORAL | Status: DC
  Administered 2023-10-27 – 2023-10-31 (×5): 40 mg via ORAL
  Filled 2023-10-27 (×5): qty 2

## 2023-10-27 MED ORDER — SODIUM CHLORIDE 0.9 % IV SOLN
500.0000 mg | Freq: Once | INTRAVENOUS | Status: AC
  Administered 2023-10-27: 500 mg via INTRAVENOUS
  Filled 2023-10-27: qty 5

## 2023-10-27 MED ORDER — ACETAMINOPHEN 500 MG PO TABS
ORAL_TABLET | ORAL | Status: AC
  Administered 2023-10-27: 1000 mg via ORAL
  Filled 2023-10-27: qty 2

## 2023-10-27 NOTE — ED Notes (Signed)
ED TO INPATIENT HANDOFF REPORT  Name/Age/Gender [name] 68 y.o. female  Code Status Code Status History     Date Active Date Inactive Code Status Order ID Comments User Context   10/01/2023 1039 10/04/2023 1843 Full Code [id number]  Noralee Stain, DO ED   09/18/2021 0424 09/20/2021 2051 Full Code [id number]  Angie Fava, DO ED   12/18/2019 1307 12/22/2019 1645 Full Code [id number]  Barrett, Rae Roam, PA-C Inpatient   03/13/2018 1356 03/18/2018 1946 Full Code [id number]  Rowe Clack, PA-C Inpatient    Questions for Most Recent Historical Code Status (Order [id number])     Question Answer   By: Consent: discussion documented in EHR            Home/SNF/Other Home  Chief Complaint CAP (community acquired pneumonia) [J18.9]  Level of Care/Admitting Diagnosis ED Disposition     ED Disposition  Admit   Condition  --   Comment  Hospital Area: [facility] [[id number]]  Level of Care: Med-Surg [16]  May place patient in observation at [facility] [place] or [facility] if equivalent level of care is available:: No  Covid Evaluation: Confirmed COVID Negative  Diagnosis: CAP (community acquired pneumonia) [387564]  Admitting Physician: Briscoe Burns [[id number]]  Attending Physician: Briscoe Burns [[id number]]          Medical History Past Medical History:  Diagnosis Date   Anxiety    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    Hepatitis C    lung ca dx'd 03/2018   Tremor, unspecified    non-specific; takes inderal.     Allergies Allergies  Allergen Reactions   Aspirin Shortness Of Breath   Trelegy Ellipta [Fluticasone-Umeclidin-Vilant] Shortness Of Breath    She reports that is made her cough worse.    Morphine Sulfate Other (See  Comments)    RED STREAKS ON ARMS.   Oxycodone Itching and Rash   Glycopyrrolate Other (See Comments)    UNSPECIFIED REACTION  pt unsure if she is allergic.   Hydrocodone Itching   Lactose Intolerance (Gi) Nausea And Vomiting    Milk only.Patient states that she can eat cheese.   Penicillins Other (See Comments)     Heartburn, upset stomach only & tolerated AMOXIL after this reaction   Sulfa Antibiotics Tinitus    IV Location/Drains/Wounds Patient Lines/Drains/Airways Status     Active Line/Drains/Airways     Name Placement date Placement time Site Days   Peripheral IV 10/27/23 20 G Anterior;Proximal;Right Forearm 10/27/23  1220  Forearm  less than 1   Peripheral IV 10/27/23 22 G Anterior;Left;Proximal Forearm 10/27/23  1348  Forearm  less than 1            Labs/Imaging Results for orders placed or performed during the hospital encounter of 10/27/23 (from the past 48 hours)  Resp panel by RT-PCR (RSV, Flu A&B, Covid) Anterior Nasal Swab     Status: None   Collection Time: 10/27/23 11:48 AM   Specimen: Anterior Nasal Swab  Result Value Ref Range   SARS Coronavirus 2 by RT PCR NEGATIVE NEGATIVE    Comment: (NOTE) SARS-CoV-2 target nucleic acids are NOT DETECTED.  The SARS-CoV-2 RNA is generally detectable in upper respiratory  specimens during the acute phase of infection. The lowest concentration of SARS-CoV-2 viral copies this assay can detect is 138 copies/mL. A negative result does not preclude SARS-Cov-2 infection and should not be used as the sole basis for treatment or other patient management decisions. A negative result may occur with  improper specimen collection/handling, submission of specimen other than nasopharyngeal swab, presence of viral mutation(s) within the areas targeted by this assay, and inadequate number of viral copies(<138 copies/mL). A negative result must be combined with clinical observations, patient history, and epidemiological information.  The expected result is Negative.  Fact Sheet for Patients:  BloggerCourse.com  Fact Sheet for Healthcare Providers:  SeriousBroker.it  This test is no t yet approved or cleared by the [place] FDA and  has been authorized for detection and/or diagnosis of SARS-CoV-2 by FDA under an Emergency Use Authorization (EUA). This EUA will remain  in effect (meaning this test can be used) for the duration of the COVID-19 declaration under Section 564(b)(1) of the Act, 21 U.S.C.section 360bbb-3(b)(1), unless the authorization is terminated  or revoked sooner.       Influenza A by PCR NEGATIVE NEGATIVE   Influenza B by PCR NEGATIVE NEGATIVE    Comment: (NOTE) The Xpert Xpress SARS-CoV-2/FLU/RSV plus assay is intended as an aid in the diagnosis of influenza from Nasopharyngeal swab specimens and should not be used as a sole basis for treatment. Nasal washings and aspirates are unacceptable for Xpert Xpress SARS-CoV-2/FLU/RSV testing.  Fact Sheet for Patients: BloggerCourse.com  Fact Sheet for Healthcare Providers: SeriousBroker.it  This test is not yet approved or cleared by the [place] FDA and has been authorized for detection and/or diagnosis of SARS-CoV-2 by FDA under an Emergency Use Authorization (EUA). This EUA will remain in effect (meaning this test can be used) for the duration of the COVID-19 declaration under Section 564(b)(1) of the Act, 21 U.S.C. section 360bbb-3(b)(1), unless the authorization is terminated or revoked.     Resp Syncytial Virus by PCR NEGATIVE NEGATIVE    Comment: (NOTE) Fact Sheet for Patients: BloggerCourse.com  Fact Sheet for Healthcare Providers: SeriousBroker.it  This test is not yet approved or cleared by the [place] FDA and has been authorized for detection and/or diagnosis of  SARS-CoV-2 by FDA under an Emergency Use Authorization (EUA). This EUA will remain in effect (meaning this test can be used) for the duration of the COVID-19 declaration under Section 564(b)(1) of the Act, 21 U.S.C. section 360bbb-3(b)(1), unless the authorization is terminated or revoked.  Performed at [facility], [address], [place], [place] [address]   Blood gas, venous     Status: Abnormal   Collection Time: 10/27/23 12:15 PM  Result Value Ref Range   pH, Ven 7.36 7.25 - 7.43   pCO2, Ven 52 44 - 60 mmHg   pO2, Ven 37 32 - 45 mmHg   Bicarbonate 29.4 (H) 20.0 - 28.0 mmol/L   Acid-Base Excess 2.9 (H) 0.0 - 2.0 mmol/L   O2 Saturation 68.9 %   Patient temperature 37.0     Comment: Performed at [facility], [address], [place], [place] [address]  Brain natriuretic peptide     Status: Abnormal   Collection Time: 10/27/23 12:15 PM  Result Value Ref Range   B Natriuretic Peptide 104.2 (H) 0.0 - 100.0 pg/mL    Comment: Performed at [facility], [address], [place], [place] [address]  Blood Culture (routine x 2)  Status: None (Preliminary result)   Collection Time: 10/27/23  1:22 PM   Specimen: BLOOD LEFT ARM  Result Value Ref Range   Specimen Description      BLOOD LEFT ARM Performed at [facility], [address] [place], [place] [address]    Special Requests      BOTTLES DRAWN AEROBIC AND ANAEROBIC Blood Culture results may not be optimal due to an inadequate volume of blood received in culture bottles Performed at [facility], [address], [place], [place] [address]    Culture PENDING    Report Status PENDING   CBC     Status: Abnormal   Collection Time: 10/27/23  1:22 PM  Result Value Ref Range   WBC 10.1 4.0 - 10.5 K/uL   RBC 4.23 3.87 - 5.11 MIL/uL   Hemoglobin 11.2 (L) 12.0 - 15.0 g/dL   HCT 09.8 (L) 11.9 - 14.7 %   MCV 83.9 80.0 - 100.0 fL   MCH 26.5 26.0 - 34.0  pg   MCHC 31.5 30.0 - 36.0 g/dL   RDW 82.9 56.2 - 13.0 %   Platelets 211 150 - 400 K/uL   nRBC 0.0 0.0 - 0.2 %    Comment: Performed at [facility], [address] [place], [place] [address]  Comprehensive metabolic panel     Status: Abnormal   Collection Time: 10/27/23  1:22 PM  Result Value Ref Range   Sodium 138 135 - 145 mmol/L   Potassium 4.0 3.5 - 5.1 mmol/L   Chloride 104 98 - 111 mmol/L   CO2 24 22 - 32 mmol/L   Glucose, Bld 132 (H) 70 - 99 mg/dL    Comment: Glucose reference range applies only to samples taken after fasting for at least 8 hours.   BUN 13 8 - 23 mg/dL   Creatinine, Ser 4.69 0.44 - 1.00 mg/dL   Calcium 8.8 (L) 8.9 - 10.3 mg/dL   Total Protein 6.6 6.5 - 8.1 g/dL   Albumin 3.5 3.5 - 5.0 g/dL   AST 21 15 - 41 U/L   ALT 21 0 - 44 U/L   Alkaline Phosphatase 81 38 - 126 U/L   Total Bilirubin 0.7 0.0 - 1.2 mg/dL   GFR, Estimated >62 >95 mL/min    Comment: (NOTE) Calculated using the CKD-EPI Creatinine Equation (2021)    Anion gap 10 5 - 15    Comment: Performed at [facility], [address], [place], [place] [address]  Magnesium     Status: Abnormal   Collection Time: 10/27/23  1:22 PM  Result Value Ref Range   Magnesium 2.8 (H) 1.7 - 2.4 mg/dL    Comment: Performed at [facility], [address], [place], [place] [address]  Lactic acid, plasma     Status: None   Collection Time: 10/27/23  1:59 PM  Result Value Ref Range   Lactic Acid, Venous 1.7 0.5 - 1.9 mmol/L    Comment: Performed at [facility], [address] [place], [place] [address]   CT Angio Chest PE W and/or Wo Contrast Result Date: 10/27/2023 CLINICAL DATA:  Pulmonary embolism (PE) suspected, high prob. History of lung carcinoma. COPD. Recent pneumonia. Shortness of breath. * Tracking Code: BO * EXAM: CT ANGIOGRAPHY CHEST WITH CONTRAST TECHNIQUE: Multidetector CT imaging of the chest was performed using the  standard protocol during bolus administration of intravenous contrast. Multiplanar CT image reconstructions and MIPs were  obtained to evaluate the vascular anatomy. RADIATION DOSE REDUCTION: This exam was performed according to the departmental dose-optimization program which includes automated exposure control, adjustment of the mA and/or kV according to patient size and/or use of iterative reconstruction technique. CONTRAST:  75mL OMNIPAQUE IOHEXOL 350 MG/ML SOLN COMPARISON:  CT scan chest from 07/15/2023. FINDINGS: Cardiovascular: No evidence of embolism to the proximal subsegmental pulmonary artery level. Normal cardiac size. No pericardial effusion. No aortic aneurysm. There are minimal peripheral atherosclerotic vascular calcifications of thoracic aorta and its major branches. Mediastinum/Nodes: Visualized thyroid gland appears grossly unremarkable. No solid / cystic mediastinal masses. The esophagus is nondistended precluding optimal assessment. There are few mildly prominent mediastinal and hilar lymph nodes, which do not meet the size criteria for lymphadenopathy and appear grossly similar to the prior study, favoring benign etiology. No axillary lymphadenopathy by size criteria. Lungs/Pleura: The central tracheo-bronchial tree is patent. Mild upper lobe predominant centrilobular emphysematous changes noted. There are postsurgical changes in the right lung lower lobe characterized by linear area of fibrosis/scarring and surgical suture and compensatory emphysematous changes in the right lower lobe. There also postsurgical changes in the left lung from prior wedge resection. There are new patchy heterogeneous opacities mainly in the middle lobe and also scattered areas in the right upper lobe, concerning for pneumonia. Follow-up to clearing is recommended. No suspicious lung mass or lung collapse. No pleural effusion or pneumothorax. Upper Abdomen: There is a small sliding hiatal hernia. Visualized upper  abdominal viscera within normal limits. Musculoskeletal: The visualized soft tissues of the chest wall are grossly unremarkable. No suspicious osseous lesions. There are mild multilevel degenerative changes in the visualized spine. Review of the MIP images confirms the above findings. IMPRESSION: 1. No evidence of embolism to the proximal subsegmental pulmonary artery level. 2. New patchy heterogeneous opacities mainly in the middle lobe and also scattered areas in the right upper lobe, concerning for pneumonia. Follow-up to clearing is recommended. 3. Multiple other nonacute observations, as described above. Aortic Atherosclerosis (ICD10-I70.0) and Emphysema (ICD10-J43.9). Electronically Signed   By: Jules Schick M.D.   On: 10/27/2023 15:28   DG Chest Port 1 View Result Date: 10/27/2023 CLINICAL DATA:  History of lung cancer.  Shortness of breath. EXAM: PORTABLE CHEST 1 VIEW COMPARISON:  10/01/2023 FINDINGS: Lungs are hyperexpanded. Nodular density in the right mid lung is new in the interval. Surgical staple lines again noted in the parahilar left lung. The cardiopericardial silhouette is within normal limits for size. No acute bony abnormality. Telemetry leads overlie the chest. IMPRESSION: New nodular density in the right mid while this may be infectious/inflammatory, given the history of lung cancer, close follow-up recommended. CT chest without contrast may be warranted to further evaluate. No pulmonary edema or pleural effusion. Electronically Signed   By: Kennith Center M.D.   On: 10/27/2023 12:26    Pending Labs Unresulted Labs (From admission, onward)     Start     Ordered   10/27/23 1706  Respiratory (~20 pathogens) panel by PCR  (Respiratory panel by PCR (~20 pathogens, ~24 hr TAT)  w precautions)  Once,   R        10/27/23 1705   10/27/23 1255  Blood Culture (routine x 2)  (Septic presentation on arrival (screening labs, nursing and treatment orders for obvious sepsis))  BLOOD CULTURE X 2,    STAT      10/27/23 1257   10/27/23 1145  CBC with Differential/Platelet  Once,   STAT  10/27/23 1144            Vitals/Pain Today's Vitals   10/27/23 1430 10/27/23 1600 10/27/23 1645 10/27/23 1715  BP: 101/84 (!) 106/59 113/64   Pulse: 82 83 81 84  Resp: (!) 25 (!) 22 (!) 29 (!) 28  Temp: 99.8 F (37.7 C)     TempSrc: Oral     SpO2: 98% 100% 100% 99%  Weight:      Height:      PainSc:        Isolation Precautions Droplet precaution  Medications Medications  lactated ringers infusion (has no administration in time range)  albuterol (PROVENTIL) (2.5 MG/3ML) 0.083% nebulizer solution (10 mg/hr Nebulization Given 10/27/23 1220)  methylPREDNISolone sodium succinate (SOLU-MEDROL) 125 mg/2 mL injection 125 mg (125 mg Intravenous Given 10/27/23 1220)  magnesium sulfate IVPB 2 g 50 mL (0 g Intravenous Stopped 10/27/23 1320)  acetaminophen (TYLENOL) tablet 1,000 mg (1,000 mg Oral Given 10/27/23 1247)  lactated ringers bolus 1,000 mL (0 mLs Intravenous Stopped 10/27/23 1403)    And  lactated ringers bolus 1,000 mL (1,000 mLs Intravenous New Bag/Given 10/27/23 1348)    And  lactated ringers bolus 250 mL (0 mLs Intravenous Stopped 10/27/23 1726)  cefTRIAXone (ROCEPHIN) 2 g in sodium chloride 0.9 % 100 mL IVPB (0 g Intravenous Stopped 10/27/23 1404)  azithromycin (ZITHROMAX) 500 mg in sodium chloride 0.9 % 250 mL IVPB (0 mg Intravenous Stopped 10/27/23 1726)  iohexol (OMNIPAQUE) 350 MG/ML injection 75 mL (75 mLs Intravenous Contrast Given 10/27/23 1444)    Mobility walks with person assist

## 2023-10-27 NOTE — Plan of Care (Signed)

## 2023-10-27 NOTE — ED Provider Notes (Cosign Needed Addendum)
[place] EMERGENCY DEPARTMENT AT [facility] Provider Note   CSN: [id number] Arrival date & time: 10/27/23  1122     History  Chief Complaint  Patient presents with   Shortness of Breath    [name] is a 68 y.o. female.  68 year old female with past medical history significant for COPD, lung cancer presents today for concern of sudden onset of shortness of breath this morning.  She states she is unable cough out of her sleep with wheezing, difficulty breathing.  She had pneumonia 3 weeks ago and was admitted to this hospital.  She states she has completed her antibiotic course.  She did improve but then had worsening of her symptoms today.  She does wear baseline 3 L supplemental O2.  Currently she is on nonrebreather.  She states this morning she had O2 sats of 80% on her home pulse oximeter.    The history is provided by the patient. No language interpreter was used.       Home Medications Prior to Admission medications   Medication Sig Start Date End Date Taking? Authorizing Provider  albuterol (PROVENTIL) (2.5 MG/3ML) 0.083% nebulizer solution Take 3 mLs (2.5 mg total) by nebulization every 4 (four) hours as needed for wheezing or shortness of breath. 07/05/22   Nyoka Cowden, MD  albuterol (VENTOLIN HFA) 108 (90 Base) MCG/ACT inhaler Inhale 2 puffs into the lungs every 6 (six) hours as needed for wheezing or shortness of breath. 05/03/21   Koberlein, Paris Lore, MD  Budeson-Glycopyrrol-Formoterol (BREZTRI AEROSPHERE) 160-9-4.8 MCG/ACT AERO Inhale 2 puffs into the lungs 2 (two) times daily. Patient taking differently: Inhale 1 puff into the lungs 2 (two) times daily. 04/09/23   Glenford Bayley, NP  clonazePAM (KLONOPIN) 0.5 MG tablet Take 1 tablet (0.5 mg total) by mouth 3 (three) times daily as needed. for anxiety 10/22/23   Shirline Frees, NP  esomeprazole (NEXIUM) 40 MG capsule Take 1 capsule (40 mg total) by mouth daily. Patient taking differently: Take  40 mg by mouth in the morning and at bedtime. 05/07/23   Mansouraty, Netty Starring., MD  guaiFENesin (MUCINEX) 600 MG 12 hr tablet Take 600 mg by mouth daily.    [provider]  PARoxetine (PAXIL) 40 MG tablet Take 1 tablet (40 mg total) by mouth daily. 10/22/23   Nafziger, Kandee Keen, NP  propranolol (INDERAL) 20 MG tablet Take 1 capsule (60 mg total) by mouth daily. 10/22/23   Nafziger, Kandee Keen, NP  traZODone (DESYREL) 100 MG tablet Take 1 tablet (100 mg total) by mouth at bedtime as needed. for sleep. Need physical exam for further refills 10/22/23   Shirline Frees, NP      Allergies    Aspirin, Trelegy ellipta [fluticasone-umeclidin-vilant], Morphine sulfate, Oxycodone, Hydrocodone, Glycopyrrolate, Lactose intolerance (gi), Penicillins, and Sulfa antibiotics    Review of Systems   Review of Systems  Constitutional:  Negative for chills and fever.  Respiratory:  Positive for cough, shortness of breath and wheezing.   Cardiovascular:  Negative for chest pain.  Gastrointestinal:  Negative for abdominal pain.  Neurological:  Negative for light-headedness.  All other systems reviewed and are negative.   Physical Exam Updated Vital Signs BP (!) 119/59   Pulse 88   Temp 99.3 F (37.4 C)   Resp 18   Ht 5\' 4"  (1.626 m)   Wt 73 kg   SpO2 100%   BMI 27.62 kg/m  Physical Exam Vitals and nursing note reviewed.  Constitutional:  General: She is not in acute distress.    Appearance: Normal appearance. She is not ill-appearing.  HENT:     Head: Normocephalic and atraumatic.     Nose: Nose normal.  Eyes:     General: No scleral icterus.    Extraocular Movements: Extraocular movements intact.     Conjunctiva/sclera: Conjunctivae normal.  Cardiovascular:     Rate and Rhythm: Normal rate and regular rhythm.     Pulses: Normal pulses.     Heart sounds: Normal heart sounds.  Pulmonary:     Effort: Respiratory distress present.     Breath sounds: Wheezing present.     Comments:  Diminished lung sounds Abdominal:     General: There is no distension.     Palpations: Abdomen is soft.     Tenderness: There is no abdominal tenderness.  Musculoskeletal:        General: Normal range of motion.     Cervical back: Normal range of motion.  Skin:    General: Skin is warm and dry.  Neurological:     General: No focal deficit present.     Mental Status: She is alert. Mental status is at baseline.     ED Results / Procedures / Treatments   Labs (all labs ordered are listed, but only abnormal results are displayed) Labs Reviewed  RESP PANEL BY RT-PCR (RSV, FLU A&B, COVID)  RVPGX2  BLOOD GAS, VENOUS  BRAIN NATRIURETIC PEPTIDE  CBC WITH DIFFERENTIAL/PLATELET  COMPREHENSIVE METABOLIC PANEL  MAGNESIUM    EKG EKG Interpretation Date/Time:  Sunday October 27 2023 11:33:13 EST Ventricular Rate:  87 PR Interval:  164 QRS Duration:  114 QT Interval:  365 QTC Calculation: 440 R Axis:   32  Text Interpretation: Sinus rhythm Borderline intraventricular conduction delay Confirmed by Virgina Norfolk [phone]) on 10/27/2023 11:34:58 AM  Radiology No results found.  Procedures .Critical Care  Performed by: Marita Kansas, PA-C Authorized by: Marita Kansas, PA-C   Critical care provider statement:    Critical care time (minutes):  35   Critical care was necessary to treat or prevent imminent or life-threatening deterioration of the following conditions:  Respiratory failure   Critical care was time spent personally by me on the following activities:  Development of treatment plan with patient or surrogate, discussions with consultants, evaluation of patient's response to treatment, examination of patient, ordering and review of laboratory studies, ordering and review of radiographic studies, ordering and performing treatments and interventions, pulse oximetry, re-evaluation of patient's condition and review of old charts   Care discussed with: admitting provider        Medications Ordered in ED Medications  albuterol (PROVENTIL,VENTOLIN) solution continuous neb (has no administration in time range)  methylPREDNISolone sodium succinate (SOLU-MEDROL) 125 mg/2 mL injection 125 mg (has no administration in time range)  magnesium sulfate IVPB 2 g 50 mL (has no administration in time range)    ED Course/ Medical Decision Making/ A&P                                 Medical Decision Making Amount and/or Complexity of Data Reviewed Labs: ordered. Radiology: ordered.  Risk OTC drugs. Prescription drug management. Decision regarding hospitalization.   Medical Decision Making / ED Course   This patient presents to the ED for concern of shortness of breath, this involves an extensive number of treatment options, and is a complaint that carries with it a  high risk of complications and morbidity.  The differential diagnosis includes COPD exacerbation, asthma exacerbation, PE, pneumonia  MDM: 68 year old female with past medical history significant for COPD, lung cancer presents today for acute hypoxic respiratory failure.  Currently on nonrebreather.  Admission considered but will obtain labs, imaging and reevaluate.  Will provide continuous neb.  Labs and imaging ordered.  Will give magnesium and Solu-Medrol.  Patient initially started off with nonrebreather.  Improved to 4 L nasal cannula.  Per his baseline O2 of 2 L.  CBC without leukocytosis.  Anemia at patient's baseline.  CMP without acute concern.  Glucose 132.  Lactic acid of 1.7.  BNP of 104.  Respiratory panel negative for COVID, flu, RSV.  VBG without acute concerns.  Chest x-ray shows evidence of pneumonia.  Given profound hypoxia we will also get a PE study.  PE study significant for pneumonia.  No PE.  Discussed with hospitalist will evaluate patient for admission given patient reports minimal exertion stable Lajoyce Corners despite treatment causes quite a bit of shortness of breath and increase in  work of breathing.  Additional history obtained: -Additional history obtained from recent admission -External records from outside source obtained and reviewed including: Chart review including previous notes, labs, imaging, consultation notes   Lab Tests: -I ordered, reviewed, and interpreted labs.   The pertinent results include:   Labs Reviewed  RESP PANEL BY RT-PCR (RSV, FLU A&B, COVID)  RVPGX2  BLOOD GAS, VENOUS  BRAIN NATRIURETIC PEPTIDE  CBC WITH DIFFERENTIAL/PLATELET  COMPREHENSIVE METABOLIC PANEL  MAGNESIUM      EKG  EKG Interpretation Date/Time:  Sunday October 27 2023 11:33:13 EST Ventricular Rate:  87 PR Interval:  164 QRS Duration:  114 QT Interval:  365 QTC Calculation: 440 R Axis:   32  Text Interpretation: Sinus rhythm Borderline intraventricular conduction delay Confirmed by Virgina Norfolk [phone]) on 10/27/2023 11:34:58 AM         Imaging Studies ordered: I ordered imaging studies including cxr, cta chest pe study I independently visualized and interpreted imaging. I agree with the radiologist interpretation   Medicines ordered and prescription drug management: Meds ordered this encounter  Medications   albuterol (PROVENTIL,VENTOLIN) solution continuous neb   methylPREDNISolone sodium succinate (SOLU-MEDROL) 125 mg/2 mL injection 125 mg    IV methylprednisolone will be converted to either a q12h or q24h frequency with the same total daily dose (TDD).  Ordered Dose: 1 to 125 mg TDD; convert to: TDD q24h.  Ordered Dose: 126 to 250 mg TDD; convert to: TDD div q12h.  Ordered Dose: >250 mg TDD; DAW.   magnesium sulfate IVPB 2 g 50 mL    -I have reviewed the patients home medicines and have made adjustments as needed  Critical interventions Continuous neb (with EMS), Solu-Medrol, antibiotics, blood cultures,  Cardiac Monitoring: The patient was maintained on a cardiac monitor.  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    Hepatitis C    lung ca dx'd 03/2018   Tremor, unspecified    non-specific; takes inderal.       Dispostion: Discussed with hospitalist.  They will evaluate patient for admission.   Final Clinical Impression(s) / ED Diagnoses Final diagnoses:  COPD exacerbation (HCC)  Pneumonia due to infectious organism, unspecified laterality, unspecified part of lung  Acute hypoxic respiratory failure [facility])    Rx / DC Orders ED Discharge Orders     None         Marita Kansas, PA-C 10/27/23 1710    Marita Kansas, PA-C 10/27/23 1711    Virgina Norfolk, DO 10/28/23 1145

## 2023-10-27 NOTE — H&P (Addendum)
History and Physical    Patient: [name]:[id number] DOB: 28-Dec-1955 DOA: 10/27/2023 DOS: the patient was seen and examined on 10/27/2023 PCP: Shirline Frees, NP  Patient coming from: Home  Chief Complaint:  Chief Complaint  Patient presents with   Shortness of Breath   HPI: [name] is a 68 y.o. female with medical history significant of lung cancer status post right lower lobectomy and left upper lobectomy (in remission for 5 years), COPD, chronic hypoxic respiratory failure on 2 L nasal cannula at home, anxiety and depression, hiatal hernia, essential tremors presenting to the ED with shortness of breath and wheezing.  Patient states that she has been dealing with pneumonia on and off for the past 2 years.  She had a recent admission in December 2024 with pneumonia and was treated with Rocephin, azithromycin, steroids with good success.  She states that she did need to come back to the ED in January for dyspnea but was found to have COVID at that time.  Since her last hospitalization, aside from ED visit for COVID, she has been doing well at home.  Ever since her first pneumonia about 2 years ago, she notes that she has had conversational dyspnea and exertional dyspnea at baseline.  Her pulmonologist, Dr. Sherene Sires, is aware of this.  She does use albuterol nebulizer and Breztri at home with good control of her symptoms at baseline.  She is chronically on 2 L oxygen at home.  She states that she was doing well up until this past Thursday.  She went to get her car fixed and had to wait outside in the cold air.  Thereafter she began having an intense coughing fit that left some soreness in her chest.  This improved after she got home and used her breast tree.  She was doing well over the weekend, but this morning when she woke up she began feeling very short of breath.  She immediately called EMS who brought her to the hospital for further evaluation.  She did not use her  breast tree today but did trial albuterol nebulizer while awaiting for EMS.  EMS did provide steroids and DuoNebs en route to the ED.  Patient reports that she has a history of lung cancer for which she underwent right lower lobectomy and left upper lobectomy about 4-1/2 years ago.  She has since followed pulmonology, Dr. Sherene Sires, and oncology, Dr. Shirline Frees.  Her cancer has been in remission for about 5 years now and she will be going for a CT scan in October for continued surveillance.  ED course: Initial vital signs stable, though she has had some slightly low blood pressure readings in the 100s/50s.  Upon arrival to the ED, placed on 4 L nasal cannula with 100% oxygen saturations.  While in the ED, she did spike a fever of 101.73F.  CBC without leukocytosis, does have mild anemia but appears to be similar to levels noted about 3 weeks ago during prior hospitalization.  CMP with mild hyperglycemia, otherwise unremarkable.  Respiratory panel negative for COVID and flu.  BNP mildly elevated.  Venous blood gas without hypercarbia.  Lactate normal.  Blood cultures collected x 2.  Magnesium level mildly elevated though she did receive magnesium in the ED on arrival.  Chest x-ray showing right middle lobe pneumonia.  CTA chest without any evidence for PE, though did show patchy heterogenous opacities in the middle lobe and right upper lobe concerning for pneumonia.  She was started on IV  fluids and started on ceftriaxone and azithromycin for CAP coverage.  [facility] hospitalist asked to evaluate patient for admission.  Review of Systems: As mentioned in the history of present illness. All other systems reviewed and are negative. 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    Hepatitis C    lung ca dx'd 03/2018   Tremor, unspecified    non-specific; takes inderal.    Past Surgical History:  Procedure Laterality Date   ABDOMINAL HYSTERECTOMY  1986   menorrhagia; hx of cryo for abnormal paps   APPENDECTOMY     removed in 1986 per pt   BIOPSY  10/31/2022   Procedure: BIOPSY;  Surgeon: Lemar Lofty., MD;  Location: [facility];  Service: Gastroenterology;;   CATARACT EXTRACTION Bilateral 07/2022   ESOPHAGOGASTRODUODENOSCOPY (EGD) WITH PROPOFOL N/A 10/31/2022   Procedure: ESOPHAGOGASTRODUODENOSCOPY (EGD) WITH PROPOFOL;  Surgeon: Lemar Lofty., MD;  Location: [facility];  Service: Gastroenterology;  Laterality: N/A;   INTERCOSTAL NERVE BLOCK Right 12/18/2019   Procedure: Intercostal Nerve Block;  Surgeon: Loreli Slot, MD;  Location: [facility] OR;  Service: Thoracic;  Laterality: Right;   LOBECTOMY Left 03/13/2018   Procedure: Lingula section of left upper lobe lobectomy;  Surgeon: Loreli Slot, MD;  Location: [facility] OR;  Service: Thoracic;  Laterality: Left;   NODE DISSECTION  12/18/2019   Procedure: Node Dissection;  Surgeon: Loreli Slot, MD;  Location: [facility] OR;  Service: Thoracic;;   SAVORY DILATION N/A 10/31/2022   Procedure: Jacklyn Shell;  Surgeon: Lemar Lofty., MD;  Location: [facility];  Service: Gastroenterology;  Laterality: N/A;   VIDEO ASSISTED THORACOSCOPY (VATS)/WEDGE RESECTION Left 03/13/2018   Procedure: VIDEO ASSISTED THORACOSCOPY (VATS)/WEDGE RESECTION;  Surgeon: Loreli Slot, MD;  Location: [facility] OR;  Service: Thoracic;  Laterality: Left;   Social History:  reports that she quit smoking about 5 years ago. Her smoking use included cigarettes. She started smoking about 45 years ago. She has a 40 pack-year smoking history. She has never been exposed to tobacco smoke. She has never used smokeless tobacco. She reports current alcohol use of about 7.0 standard drinks of alcohol per  week. She reports that she does not currently use drugs.  Allergies  Allergen Reactions   Aspirin Shortness Of Breath   Trelegy Ellipta [Fluticasone-Umeclidin-Vilant] Shortness Of Breath    She reports that is made her cough worse.    Morphine Sulfate Other (See Comments)    RED STREAKS ON ARMS.   Oxycodone Itching and Rash   Glycopyrrolate Other (See Comments)    UNSPECIFIED REACTION  pt unsure if she is allergic.   Hydrocodone Itching   Lactose Intolerance (Gi) Nausea And Vomiting    Milk only.Patient states that she can eat cheese.   Penicillins Other (See Comments)     Heartburn, upset stomach only & tolerated AMOXIL after this reaction   Sulfa Antibiotics Tinitus    Family History  Problem Relation Age of Onset   Asthma Mother    Alzheimer's disease Mother 25   Hypertension Mother    Other Father        shot    Neuropathy Sister  face   Cancer Maternal Aunt        breast   Cancer Paternal Aunt    Lung cancer Paternal Uncle    Cancer Paternal Grandmother        abdominal   Colon cancer Neg Hx    Esophageal cancer Neg Hx    Inflammatory bowel disease Neg Hx    Liver disease Neg Hx    Pancreatic cancer Neg Hx    Rectal cancer Neg Hx    Stomach cancer Neg Hx     Prior to Admission medications   Medication Sig Start Date End Date Taking? Authorizing Provider  SUMAtriptan (IMITREX) 50 MG tablet Take 50 mg by mouth. 10/19/23  Yes [provider]  albuterol (PROVENTIL) (2.5 MG/3ML) 0.083% nebulizer solution Take 3 mLs (2.5 mg total) by nebulization every 4 (four) hours as needed for wheezing or shortness of breath. 07/05/22   Nyoka Cowden, MD  albuterol (VENTOLIN HFA) 108 (90 Base) MCG/ACT inhaler Inhale 2 puffs into the lungs every 6 (six) hours as needed for wheezing or shortness of breath. 05/03/21   Koberlein, Paris Lore, MD  Budeson-Glycopyrrol-Formoterol (BREZTRI AEROSPHERE) 160-9-4.8 MCG/ACT AERO Inhale 2 puffs into the lungs 2 (two) times  daily. Patient taking differently: Inhale 1 puff into the lungs 2 (two) times daily. 04/09/23   Glenford Bayley, NP  clonazePAM (KLONOPIN) 0.5 MG tablet Take 1 tablet (0.5 mg total) by mouth 3 (three) times daily as needed. for anxiety 10/22/23   Shirline Frees, NP  esomeprazole (NEXIUM) 40 MG capsule Take 1 capsule (40 mg total) by mouth daily. Patient taking differently: Take 40 mg by mouth in the morning and at bedtime. 05/07/23   Mansouraty, Netty Starring., MD  guaiFENesin (MUCINEX) 600 MG 12 hr tablet Take 600 mg by mouth daily.    [provider]  PARoxetine (PAXIL) 40 MG tablet Take 1 tablet (40 mg total) by mouth daily. 10/22/23   Nafziger, Kandee Keen, NP  propranolol (INDERAL) 20 MG tablet Take 1 capsule (60 mg total) by mouth daily. 10/22/23   Nafziger, Kandee Keen, NP  traZODone (DESYREL) 100 MG tablet Take 1 tablet (100 mg total) by mouth at bedtime as needed. for sleep. Need physical exam for further refills 10/22/23   Shirline Frees, NP    Physical Exam: Vitals:   10/27/23 1430 10/27/23 1600 10/27/23 1645 10/27/23 1715  BP: 101/84 (!) 106/59 113/64   Pulse: 82 83 81 84  Resp: (!) 25 (!) 22 (!) 29 (!) 28  Temp: 99.8 F (37.7 C)     TempSrc: Oral     SpO2: 98% 100% 100% 99%  Weight:      Height:       Physical Exam Constitutional:      Appearance: She is well-developed. She is not ill-appearing.  HENT:     Mouth/Throat:     Mouth: Mucous membranes are moist.     Pharynx: Oropharynx is clear. No oropharyngeal exudate.  Eyes:     Extraocular Movements: Extraocular movements intact.     Pupils: Pupils are equal, round, and reactive to light.  Cardiovascular:     Rate and Rhythm: Normal rate and regular rhythm.     Pulses: Normal pulses.     Heart sounds: Normal heart sounds. No murmur heard.    No friction rub. No gallop.  Pulmonary:     Effort: Pulmonary effort is normal. No accessory muscle usage or respiratory distress.     Breath sounds: No rhonchi or rales.  Comments:  Diminished air entry bilaterally with mild expiratory wheezing noted. History of right lower lobectomy and left upper lobectomy. Saturating 99% on home 2L [place]. Does have some conversational dyspnea at baseline. Abdominal:     General: Bowel sounds are normal.     Palpations: Abdomen is soft.     Tenderness: There is no abdominal tenderness. There is no guarding or rebound.  Musculoskeletal:        General: Normal range of motion.     Right lower leg: No edema.     Left lower leg: No edema.  Skin:    General: Skin is warm and dry.  Neurological:     General: No focal deficit present.     Mental Status: She is alert and oriented to person, place, and time.  Psychiatric:        Mood and Affect: Mood normal.        Behavior: Behavior normal.     Data Reviewed:  There are no new results to review at this time.  Assessment and Plan: No notes have been filed under this hospital service. Service: Hospitalist  Community-acquired pneumonia COPD Chronic hypoxic respiratory failure on 2 L nasal cannula at home Patient with history of COPD with chronic hypoxic respiratory failure on 2 L nasal cannula at home.  She has presented to the hospital with pneumonia on and off for the past 2 years, most recent admission 1 month ago.  In the ED, she did spike a fever up to 101.12F.  CBC without leukocytosis.  COVID and flu testing negative.  Both chest x-ray and CTA showing right middle lobe pneumonia.  She does have diminished air entry and mild wheezing noted on exam, but is currently saturating greater than 95% on her home 2 L [place].  Though she did spike a fever and is slightly tachypneic, no signs of endorgan damage on labs.  She is not currently septic though she did receive about 2.5 L of IV fluids in the ED per sepsis protocol. -Azithromycin, Rocephin -Stopped IV fluids, encouraging oral fluid intake -Incruse Ellipta and Breo Ellipta in place of home Breztri -DuoNebs every 4 hours as  needed -Follow-up respiratory panel, blood cultures x 2, strep pneumonia urinary antigen -Droplet precautions while awaiting respiratory panel -Supplemental oxygen as needed to maintain O2 sats greater than 88% -Flutter valve, incentive spirometry -PT/OT eval with ambulatory O2 sats tomorrow -Place in observation  Normocytic anemia Hemoglobin 11.2 on arrival.  Hemoglobin has ranged from 11.4-13.7 during hospitalization 3 weeks ago.  No signs or symptoms of overt bleeding noted.  Patient denies any melena, hematochezia, hematemesis. -Trend hemoglobin curve  Anxiety and depression -Continue home Klonopin 0.5 mg 3 times daily as needed (PDMP reviewed and appropriate) -Continue home Paxil -Continue home trazodone nightly  Hiatal hernia -Unable to obtain surgery due to impaired respiratory status -Continue Protonix  Essential tremors -Holding home propranolol while managing COPD exacerbation  History of lung cancer status post left upper lobe lobectomy and right lower lobectomy -Follows pulmonology, Dr. Sherene Sires -Follows oncology, Dr. Shirline Frees -In remission for the past 4-1/2 to 5 years  Migraine -Recently prescribed sumatriptan but has not yet taken -Not currently complaining of any headache, will hold sumatriptan    Advance Care Planning:   Code Status: Limited: Do not attempt resuscitation (DNR) -DNR-LIMITED -Do Not Intubate/DNI discussed CODE STATUS at length.  She is AAOx4.  Her wishes best align with DNR limited CODE STATUS.  Consults: None  Family Communication: No family at  bedside  Severity of Illness: The appropriate patient status for this patient is OBSERVATION. Observation status is judged to be reasonable and necessary in order to provide the required intensity of service to ensure the patient's safety. The patient's presenting symptoms, physical exam findings, and initial radiographic and laboratory data in the context of their medical condition is felt to place them at  decreased risk for further clinical deterioration. Furthermore, it is anticipated that the patient will be medically stable for discharge from the hospital within 2 midnights of admission.   Portions of this note were generated with Scientist, clinical (histocompatibility and immunogenetics). Dictation errors may occur despite best attempts at proofreading.   Author: Briscoe Burns, MD 10/27/2023 5:52 PM  For on call review [email or web address].

## 2023-10-27 NOTE — ED Triage Notes (Signed)
Biba with Hx of copd, lung Ca. Pt had Pneumonia  3 weeks ago. Finished antibiotics fully. Woke up this morning shob. This morning, did albuterol treatment at home. Pt 93% percent on 2L. No resp distress noted on arrival. Able to speak in full sentences. Denies any pain

## 2023-10-27 NOTE — Sepsis Progress Note (Signed)
Sepsis protocol is being followed by [facility].

## 2023-10-27 NOTE — Hospital Course (Signed)
Wheezing, SHOB  Hx of COPD Hx of lung cancer  Recently admitted for pneumonia  PNA again today  Solumedrol and nebs PTA Mg here  SHOB with minimal exertion Conversational dyspnea  Rocephin, azithro

## 2023-10-28 DIAGNOSIS — G250 Essential tremor: Secondary | ICD-10-CM | POA: Diagnosis not present

## 2023-10-28 DIAGNOSIS — R0602 Shortness of breath: Secondary | ICD-10-CM | POA: Diagnosis not present

## 2023-10-28 DIAGNOSIS — J9601 Acute respiratory failure with hypoxia: Secondary | ICD-10-CM

## 2023-10-28 DIAGNOSIS — Z820 Family history of epilepsy and other diseases of the nervous system: Secondary | ICD-10-CM | POA: Diagnosis not present

## 2023-10-28 DIAGNOSIS — D649 Anemia, unspecified: Secondary | ICD-10-CM | POA: Diagnosis not present

## 2023-10-28 DIAGNOSIS — J441 Chronic obstructive pulmonary disease with (acute) exacerbation: Secondary | ICD-10-CM | POA: Diagnosis not present

## 2023-10-28 DIAGNOSIS — Z9981 Dependence on supplemental oxygen: Secondary | ICD-10-CM | POA: Diagnosis not present

## 2023-10-28 DIAGNOSIS — Z90710 Acquired absence of both cervix and uterus: Secondary | ICD-10-CM | POA: Diagnosis not present

## 2023-10-28 DIAGNOSIS — Z8249 Family history of ischemic heart disease and other diseases of the circulatory system: Secondary | ICD-10-CM | POA: Diagnosis not present

## 2023-10-28 DIAGNOSIS — F419 Anxiety disorder, unspecified: Secondary | ICD-10-CM | POA: Diagnosis not present

## 2023-10-28 DIAGNOSIS — F32A Depression, unspecified: Secondary | ICD-10-CM | POA: Diagnosis not present

## 2023-10-28 DIAGNOSIS — Z8616 Personal history of COVID-19: Secondary | ICD-10-CM | POA: Diagnosis not present

## 2023-10-28 DIAGNOSIS — Z801 Family history of malignant neoplasm of trachea, bronchus and lung: Secondary | ICD-10-CM | POA: Diagnosis not present

## 2023-10-28 DIAGNOSIS — J9611 Chronic respiratory failure with hypoxia: Secondary | ICD-10-CM | POA: Diagnosis not present

## 2023-10-28 DIAGNOSIS — Z87891 Personal history of nicotine dependence: Secondary | ICD-10-CM | POA: Diagnosis not present

## 2023-10-28 DIAGNOSIS — Z79899 Other long term (current) drug therapy: Secondary | ICD-10-CM | POA: Diagnosis not present

## 2023-10-28 DIAGNOSIS — Z825 Family history of asthma and other chronic lower respiratory diseases: Secondary | ICD-10-CM | POA: Diagnosis not present

## 2023-10-28 DIAGNOSIS — R0609 Other forms of dyspnea: Secondary | ICD-10-CM | POA: Diagnosis not present

## 2023-10-28 DIAGNOSIS — J439 Emphysema, unspecified: Secondary | ICD-10-CM | POA: Diagnosis not present

## 2023-10-28 DIAGNOSIS — Z1152 Encounter for screening for COVID-19: Secondary | ICD-10-CM | POA: Diagnosis not present

## 2023-10-28 DIAGNOSIS — G43909 Migraine, unspecified, not intractable, without status migrainosus: Secondary | ICD-10-CM | POA: Diagnosis not present

## 2023-10-28 DIAGNOSIS — I050 Rheumatic mitral stenosis: Secondary | ICD-10-CM | POA: Diagnosis not present

## 2023-10-28 DIAGNOSIS — J189 Pneumonia, unspecified organism: Secondary | ICD-10-CM | POA: Diagnosis not present

## 2023-10-28 DIAGNOSIS — K449 Diaphragmatic hernia without obstruction or gangrene: Secondary | ICD-10-CM | POA: Diagnosis not present

## 2023-10-28 DIAGNOSIS — J440 Chronic obstructive pulmonary disease with acute lower respiratory infection: Secondary | ICD-10-CM | POA: Diagnosis not present

## 2023-10-28 DIAGNOSIS — Z902 Acquired absence of lung [part of]: Secondary | ICD-10-CM | POA: Diagnosis not present

## 2023-10-28 DIAGNOSIS — Z66 Do not resuscitate: Secondary | ICD-10-CM | POA: Diagnosis not present

## 2023-10-28 LAB — RESPIRATORY PANEL BY PCR

## 2023-10-28 LAB — STREP PNEUMONIAE URINARY ANTIGEN: Strep Pneumo Urinary Antigen: NEGATIVE

## 2023-10-28 LAB — HIV ANTIBODY (ROUTINE TESTING W REFLEX): HIV Screen 4th Generation wRfx: NONREACTIVE

## 2023-10-28 MED ORDER — ACETAMINOPHEN 500 MG PO TABS
500.0000 mg | ORAL_TABLET | Freq: Once | ORAL | Status: AC
  Administered 2023-10-28: 500 mg via ORAL
  Filled 2023-10-28: qty 1

## 2023-10-28 MED ORDER — ALBUTEROL SULFATE (2.5 MG/3ML) 0.083% IN NEBU
2.5000 mg | INHALATION_SOLUTION | Freq: Two times a day (BID) | RESPIRATORY_TRACT | Status: DC
  Administered 2023-10-28 – 2023-10-29 (×2): 2.5 mg via RESPIRATORY_TRACT
  Filled 2023-10-28 (×2): qty 3

## 2023-10-28 MED ORDER — ACETAMINOPHEN 325 MG PO TABS: 650.0000 mg | ORAL_TABLET | ORAL | Status: DC | PRN

## 2023-10-28 MED ORDER — DM-GUAIFENESIN ER 30-600 MG PO TB12
1.0000 | ORAL_TABLET | Freq: Two times a day (BID) | ORAL | Status: DC
  Administered 2023-10-28 – 2023-10-31 (×7): 1 via ORAL
  Filled 2023-10-28 (×7): qty 1

## 2023-10-28 MED ORDER — SACCHAROMYCES BOULARDII 250 MG PO CAPS
250.0000 mg | ORAL_CAPSULE | Freq: Two times a day (BID) | ORAL | Status: DC
  Administered 2023-10-28 – 2023-10-31 (×7): 250 mg via ORAL
  Filled 2023-10-28 (×7): qty 1

## 2023-10-28 MED ORDER — IBUPROFEN 200 MG PO TABS: 200.0000 mg | ORAL_TABLET | Freq: Four times a day (QID) | ORAL | Status: DC | PRN

## 2023-10-28 NOTE — Progress Notes (Signed)
PT Cancellation Note  Patient Details Name: [name] MRN: [id number] DOB: March 27, 1956   Cancelled Treatment:    Reason Eval/Treat Not Completed: Fatigue/lethargy limiting ability to participate   [facility] 10/28/2023, 3:13 PM

## 2023-10-28 NOTE — Plan of Care (Signed)

## 2023-10-28 NOTE — Evaluation (Signed)
Occupational Therapy Evaluation Patient Details Name: [name] MRN: [id number] DOB: Nov 01, 1955 Today's Date: 10/28/2023   History of Present Illness [name] is a 68 y.o. female with medical history significant of lung cancer status post right lower lobectomy and left upper lobectomy (in remission for 5 years), COPD, chronic hypoxic respiratory failure on 2 L nasal cannula at home, anxiety and depression, hiatal hernia, essential tremors presenting to the ED with shortness of breath and wheezing   Clinical Impression   Pt is at Sup - Mod I level with ADLs and ADL mobility using no ADs. PTA pt lived with her 93 lb dog and roommate and was Ind - Mod I with ADLs/selfcare and using no AD for mobility; pt uses a tub bench.  Pt requires rest breaks with minimal exertion, O2 SATs remained >92% with minimal exertion during in room activity. Pt familair with energy conservation strategies, reviewed them with pt and is able to recall them to therapist; declines EC education handout at this time. All education completed and no further acute OT services are indicated at this time, OT will sign off      If plan is discharge home, recommend the following: Assistance with cooking/housework    Functional Status Assessment  Patient has not had a recent decline in their functional status  Equipment Recommendations  None recommended by OT    Recommendations for Other Services       Precautions / Restrictions Precautions Precautions: Other (comment) Precaution Comments: DOE, watch O2 SATs Restrictions Weight Bearing Restrictions Per Provider Order: No      Mobility Bed Mobility Overal bed mobility: Independent                  Transfers Overall transfer level: Needs assistance Equipment used: None Transfers: Sit to/from Stand Sit to Stand: Supervision           General transfer comment: DOE after activity walking back from batroom      Balance Overall  balance assessment: No apparent balance deficits (not formally assessed)                                         ADL either performed or assessed with clinical judgement   ADL Overall ADL's : Modified independent                                       General ADL Comments: requires rest breaks with minimal exertion, O2 SATs remained >92% with minimal exertion during in room activity. Pt familair with energy conservation strategies and is able to recall them to therapist; declines EC education handout     Vision Baseline Vision/History: 1 Wears glasses Ability to See in Adequate Light: 0 Adequate Patient Visual Report: No change from baseline       Perception         Praxis         Pertinent Vitals/Pain Pain Assessment Pain Assessment: No/denies pain     Extremity/Trunk Assessment Upper Extremity Assessment Upper Extremity Assessment: Overall WFL for tasks assessed   Lower Extremity Assessment Lower Extremity Assessment: Defer to PT evaluation       Communication Communication Communication: No apparent difficulties   Cognition Arousal: Alert Behavior During Therapy: WFL for tasks assessed/performed Overall Cognitive Status: Within Functional Limits  for tasks assessed                                 General Comments: pt very pleasant     General Comments       Exercises     Shoulder Instructions      Home Living Family/patient expects to be discharged to:: Private residence Living Arrangements: Non-relatives/Friends Available Help at Discharge: Friend(s) Type of Home: House Home Access: Stairs to enter [employer] of Steps: 4   Home Layout: One level     Bathroom Shower/Tub: Chief Strategy Officer: Handicapped height     Home Equipment: Rollator (4 wheels);Tub bench   Additional Comments: 2L home O2 prn      Prior Functioning/Environment Prior Level of Function :  Independent/Modified Independent;Driving             Mobility Comments: Pt reports that she has had a rollater since 12/24 but does not use it ADLs Comments: Pt Ind with ADLs/elfcare, drives, light meal prep and takes care of her dog        OT Problem List: Decreased activity tolerance      OT Treatment/Interventions:      OT Goals(Current goals can be found in the care plan section) Acute Rehab OT Goals Patient Stated Goal: go home OT Goal Formulation: With patient  OT Frequency:      Co-evaluation              AM-PAC OT "6 Clicks" Daily Activity     Outcome Measure Help from another person eating meals?: None Help from another person taking care of personal grooming?: None Help from another person toileting, which includes using toliet, bedpan, or urinal?: None Help from another person bathing (including washing, rinsing, drying)?: None Help from another person to put on and taking off regular upper body clothing?: None Help from another person to put on and taking off regular lower body clothing?: None 6 Click Score: 24   End of Session Equipment Utilized During Treatment: Oxygen Nurse Communication: Mobility status;Other (comment) (O2 SATs)  Activity Tolerance: Patient tolerated treatment well;Other (comment) (DOE, required rest breaks with minimal exertion) Patient left:    OT Visit Diagnosis: Muscle weakness (generalized) (M62.81)                Time: 6578-4696 OT Time Calculation (min): 19 min Charges:  OT General Charges $OT Visit: 1 Visit OT Evaluation $OT Eval Low Complexity: 1 Low    Galen Manila 10/28/2023, 11:53 AM

## 2023-10-28 NOTE — Progress Notes (Signed)
[facility]                                                                               [name], is a 68 y.o. female, DOB - 03-31-1956, ZOX:[id number] Admit date - 10/27/2023    Outpatient Primary MD for the patient is Nature conservation officer, Kandee Keen, NP  LOS - 0  days    Brief summary    [name] is a 68 y.o. female with medical history significant of lung cancer status post right lower lobectomy and left upper lobectomy (in remission for 5 years), COPD, chronic hypoxic respiratory failure on 2 L nasal cannula at home, anxiety and depression, hiatal hernia, essential tremors presenting to the ED with shortness of breath and wheezing   Patient reports that she has a history of lung cancer for which she underwent right lower lobectomy and left upper lobectomy about 4-1/2 years ago.  She has since followed pulmonology, Dr. Sherene Sires, and oncology, Dr. Shirline Frees.  Her cancer has been in remission for about 5 years now and she will be going for a CT scan in October for continued surveillance.   Chest x-ray showing right middle lobe pneumonia. CTA chest without any evidence for PE, though did show patchy heterogenous opacities in the middle lobe and right upper lobe concerning for pneumonia. She was started on IV fluids and started on ceftriaxone and azithromycin for CAP coverage.   Assessment & Plan    Assessment and Plan:   Community acquired pneumonia:  Superimposed on chronic respiratory failure sec to COPD on 2 lit of [place] oxygen.  Continue with IV antibiotics.  Cough medication Mucinex added.  Follow up blood cultures and sputum cultures.  Duonebs as needed.  Urine strep and legionella antigen are pending.  Therapy evaluations will be ordered.     H/o LUng ca  S/p lobectomy  Follows up with Dr Arbutus Ped and Dr Sherene Sires.     Normocytic anemia Monitor.    Anxiety and depression:  Resume home meds.       Estimated body mass index is 27.62 kg/m as calculated  from the following:   Height as of this encounter: 5\' 4"  (1.626 m).   Weight as of this encounter: 73 kg.  Code Status: DNR limited DVT Prophylaxis:  enoxaparin (LOVENOX) injection 40 mg Start: 10/27/23 2200   Level of Care: Level of care: Med-Surg Family Communication: none at bedside Disposition Plan:     Remains inpatient appropriate:  IV antibiotics.   Procedures:  None.   Consultants:   None.   Antimicrobials:   Anti-infectives (From admission, onward)    Start     Dose/Rate Route Frequency Ordered Stop   10/28/23 1300  cefTRIAXone (ROCEPHIN) 2 g in sodium chloride 0.9 % 100 mL IVPB        2 g 200 mL/hr over 30 Minutes Intravenous Every 24 hours 10/27/23 1731 11/01/23 1259   10/28/23 1000  azithromycin (ZITHROMAX) tablet 500 mg        500 mg Oral Daily 10/27/23 1731 11/01/23 0959   10/27/23 1300  cefTRIAXone (ROCEPHIN) 2 g in sodium chloride 0.9 % 100 mL IVPB  2 g 200 mL/hr over 30 Minutes Intravenous Once 10/27/23 1257 10/27/23 1404   10/27/23 1300  azithromycin (ZITHROMAX) 500 mg in sodium chloride 0.9 % 250 mL IVPB        500 mg 250 mL/hr over 60 Minutes Intravenous  Once 10/27/23 1257 10/27/23 1726        Medications  Scheduled Meds:  azithromycin  500 mg Oral Daily   dextromethorphan-guaiFENesin  1 tablet Oral BID   enoxaparin (LOVENOX) injection  40 mg Subcutaneous Q24H   fluticasone furoate-vilanterol  1 puff Inhalation Daily   pantoprazole  40 mg Oral Daily   PARoxetine  40 mg Oral Daily   predniSONE  40 mg Oral Q breakfast   saccharomyces boulardii  250 mg Oral BID   traZODone  100 mg Oral QHS   umeclidinium bromide  1 puff Inhalation Daily   Continuous Infusions:  cefTRIAXone (ROCEPHIN)  IV 2 g (10/28/23 1223)   PRN Meds:.acetaminophen, clonazePAM, ibuprofen, ipratropium-albuterol    Subjective:   Caterra Ostroff was seen and examined today.  Has a headache.   Objective:   Vitals:   10/28/23 0748 10/28/23 0836 10/28/23 1010  10/28/23 1445  BP: 116/66   104/81  Pulse: 83   86  Resp: 20   20  Temp: 98.1 F (36.7 C)   98.8 F (37.1 C)  TempSrc: Oral   Oral  SpO2: 100% 100% 100% 98%  Weight:      Height:        Intake/Output Summary (Last 24 hours) at 10/28/2023 1501 Last data filed at 10/27/2023 2000 Gross per 24 hour  Intake 700 ml  Output --  Net 700 ml   Filed Weights   10/27/23 1128  Weight: 73 kg     Exam General exam: Appears calm and comfortable  Respiratory system: bilateral wheezing heard, on [place] oxygen.  Cardiovascular system: S1 & S2 heard, RRR.  Gastrointestinal system: Abdomen is nondistended, soft and nontender.  Central nervous system: Alert and oriented. No focal neurological deficits. Extremities: Symmetric 5 x 5 power. Skin: No rashes,  Psychiatry: Mood & affect appropriate.     Data Reviewed:  I have personally reviewed following labs and imaging studies   CBC Lab Results  Component Value Date   WBC 10.1 10/27/2023   RBC 4.23 10/27/2023   HGB 11.2 (L) 10/27/2023   HCT 35.5 (L) 10/27/2023   MCV 83.9 10/27/2023   MCH 26.5 10/27/2023   PLT 211 10/27/2023   MCHC 31.5 10/27/2023   RDW 13.9 10/27/2023   LYMPHSABS 1.4 07/15/2023   MONOABS 0.6 07/15/2023   EOSABS 0.2 07/15/2023   BASOSABS 0.1 07/15/2023     Last metabolic panel Lab Results  Component Value Date   NA 138 10/27/2023   K 4.0 10/27/2023   CL 104 10/27/2023   CO2 24 10/27/2023   BUN 13 10/27/2023   CREATININE 0.87 10/27/2023   GLUCOSE 132 (H) 10/27/2023   GFRNONAA >60 10/27/2023   GFRAA 76 07/14/2020   CALCIUM 8.8 (L) 10/27/2023   PHOS 3.2 09/18/2021   PROT 6.6 10/27/2023   ALBUMIN 3.5 10/27/2023   BILITOT 0.7 10/27/2023   ALKPHOS 81 10/27/2023   AST 21 10/27/2023   ALT 21 10/27/2023   ANIONGAP 10 10/27/2023    CBG (last 3)  No results for input(s): "GLUCAP" in the last 72 hours.    Coagulation Profile: No results for input(s): "INR", "PROTIME" in the last 168 hours.   Radiology  Studies: CT Angio Chest  PE W and/or Wo Contrast Result Date: 10/27/2023 CLINICAL DATA:  Pulmonary embolism (PE) suspected, high prob. History of lung carcinoma. COPD. Recent pneumonia. Shortness of breath. * Tracking Code: BO * EXAM: CT ANGIOGRAPHY CHEST WITH CONTRAST TECHNIQUE: Multidetector CT imaging of the chest was performed using the standard protocol during bolus administration of intravenous contrast. Multiplanar CT image reconstructions and MIPs were obtained to evaluate the vascular anatomy. RADIATION DOSE REDUCTION: This exam was performed according to the departmental dose-optimization program which includes automated exposure control, adjustment of the mA and/or kV according to patient size and/or use of iterative reconstruction technique. CONTRAST:  75mL OMNIPAQUE IOHEXOL 350 MG/ML SOLN COMPARISON:  CT scan chest from 07/15/2023. FINDINGS: Cardiovascular: No evidence of embolism to the proximal subsegmental pulmonary artery level. Normal cardiac size. No pericardial effusion. No aortic aneurysm. There are minimal peripheral atherosclerotic vascular calcifications of thoracic aorta and its major branches. Mediastinum/Nodes: Visualized thyroid gland appears grossly unremarkable. No solid / cystic mediastinal masses. The esophagus is nondistended precluding optimal assessment. There are few mildly prominent mediastinal and hilar lymph nodes, which do not meet the size criteria for lymphadenopathy and appear grossly similar to the prior study, favoring benign etiology. No axillary lymphadenopathy by size criteria. Lungs/Pleura: The central tracheo-bronchial tree is patent. Mild upper lobe predominant centrilobular emphysematous changes noted. There are postsurgical changes in the right lung lower lobe characterized by linear area of fibrosis/scarring and surgical suture and compensatory emphysematous changes in the right lower lobe. There also postsurgical changes in the left lung from prior wedge  resection. There are new patchy heterogeneous opacities mainly in the middle lobe and also scattered areas in the right upper lobe, concerning for pneumonia. Follow-up to clearing is recommended. No suspicious lung mass or lung collapse. No pleural effusion or pneumothorax. Upper Abdomen: There is a small sliding hiatal hernia. Visualized upper abdominal viscera within normal limits. Musculoskeletal: The visualized soft tissues of the chest wall are grossly unremarkable. No suspicious osseous lesions. There are mild multilevel degenerative changes in the visualized spine. Review of the MIP images confirms the above findings. IMPRESSION: 1. No evidence of embolism to the proximal subsegmental pulmonary artery level. 2. New patchy heterogeneous opacities mainly in the middle lobe and also scattered areas in the right upper lobe, concerning for pneumonia. Follow-up to clearing is recommended. 3. Multiple other nonacute observations, as described above. Aortic Atherosclerosis (ICD10-I70.0) and Emphysema (ICD10-J43.9). Electronically Signed   By: Jules Schick M.D.   On: 10/27/2023 15:28   DG Chest Port 1 View Result Date: 10/27/2023 CLINICAL DATA:  History of lung cancer.  Shortness of breath. EXAM: PORTABLE CHEST 1 VIEW COMPARISON:  10/01/2023 FINDINGS: Lungs are hyperexpanded. Nodular density in the right mid lung is new in the interval. Surgical staple lines again noted in the parahilar left lung. The cardiopericardial silhouette is within normal limits for size. No acute bony abnormality. Telemetry leads overlie the chest. IMPRESSION: New nodular density in the right mid while this may be infectious/inflammatory, given the history of lung cancer, close follow-up recommended. CT chest without contrast may be warranted to further evaluate. No pulmonary edema or pleural effusion. Electronically Signed   By: Kennith Center M.D.   On: 10/27/2023 12:26       Kathlen Mody M.D. [facility] 10/28/2023, 3:01  PM  Available via Epic secure chat 7am-7pm After 7 pm, please refer to night coverage provider listed on amion.

## 2023-10-29 ENCOUNTER — Inpatient Hospital Stay (HOSPITAL_COMMUNITY): Payer: No Typology Code available for payment source

## 2023-10-29 DIAGNOSIS — J189 Pneumonia, unspecified organism: Secondary | ICD-10-CM | POA: Diagnosis not present

## 2023-10-29 DIAGNOSIS — J441 Chronic obstructive pulmonary disease with (acute) exacerbation: Secondary | ICD-10-CM | POA: Diagnosis not present

## 2023-10-29 DIAGNOSIS — R0609 Other forms of dyspnea: Secondary | ICD-10-CM | POA: Diagnosis not present

## 2023-10-29 DIAGNOSIS — J9601 Acute respiratory failure with hypoxia: Secondary | ICD-10-CM | POA: Diagnosis not present

## 2023-10-29 LAB — ECHOCARDIOGRAM COMPLETE
AR max vel: 2.82 cm2
AV Area VTI: 2.76 cm2
AV Area mean vel: 2.67 cm2
AV Mean grad: 4 mm[Hg]
AV Peak grad: 6.5 mm[Hg]
Ao pk vel: 1.27 m/s
Area-P 1/2: 2.5 cm2
Calc EF: 70 %
Height: 64 in
MV VTI: 1.16 cm2
S' Lateral: 1.9 cm
Single Plane A2C EF: 66.3 %
Single Plane A4C EF: 71.9 %
Weight: 2574.97 [oz_av]

## 2023-10-29 MED ORDER — NEOMYCIN-POLYMYXIN-PRAMOXINE 1 % EX CREA
TOPICAL_CREAM | Freq: Two times a day (BID) | CUTANEOUS | Status: DC
  Filled 2023-10-29: qty 28

## 2023-10-29 MED ORDER — ALBUTEROL SULFATE (2.5 MG/3ML) 0.083% IN NEBU
2.5000 mg | INHALATION_SOLUTION | Freq: Three times a day (TID) | RESPIRATORY_TRACT | Status: DC
Start: 2023-10-29 — End: 2023-10-31
  Administered 2023-10-29 – 2023-10-31 (×5): 2.5 mg via RESPIRATORY_TRACT
  Filled 2023-10-29 (×5): qty 3

## 2023-10-29 MED ORDER — BACITRACIN-NEOMYCIN-POLYMYXIN OINTMENT TUBE
TOPICAL_OINTMENT | Freq: Two times a day (BID) | CUTANEOUS | Status: DC
  Filled 2023-10-29: qty 14.17

## 2023-10-29 NOTE — Care Plan (Signed)
Pt called RN in to show new Left pinky blister.  Pt states "I pinched my finger on the toilet handle and now I have this blister." MD notified

## 2023-10-29 NOTE — Plan of Care (Signed)

## 2023-10-29 NOTE — Discharge Instructions (Signed)
FOOD PANTRY [facility] [address] [phone]  [facility] [address] [place] [phone]  [facility] [employer] [address] [place] [phone]  [facility] [address] [phone]  [facility] [address] [place], [place] [address] [phone]

## 2023-10-29 NOTE — Evaluation (Signed)
Physical Therapy Evaluation Patient Details Name: [name] MRN: [id number] DOB: 07-Aug-1956 Today's Date: 10/29/2023  History of Present Illness  [name] is a 68 y.o. female presented to the ED with shortness of breath and wheezing. Dx of PNA. Pt with medical history significant of lung cancer status post right lower lobectomy and left upper lobectomy (in remission for 5 years), COPD, chronic hypoxic respiratory failure on 2 L nasal cannula at home, anxiety and depression, hiatal hernia, essential tremors  Clinical Impression  Pt admitted with above diagnosis. Pt transferred from bed to recliner with contact guard assistance, ambulation deferred 2* dyspnea with transferring. SpO2 93% on 2L O2 with activity. Pt is independent with mobility at baseline. Good progress expected.  Pt currently with functional limitations due to the deficits listed below (see PT Problem List). Pt will benefit from acute skilled PT to increase their independence and safety with mobility to allow discharge.           If plan is discharge home, recommend the following: A little help with bathing/dressing/bathroom;A little help with walking and/or transfers;Assist for transportation;Help with stairs or ramp for entrance   Can travel by private vehicle        Equipment Recommendations None recommended by PT  Recommendations for Other Services       Functional Status Assessment Patient has had a recent decline in their functional status and demonstrates the ability to make significant improvements in function in a reasonable and predictable amount of time.     Precautions / Restrictions Precautions Precautions: Other (comment) Precaution Comments: DOE, watch O2 SATs, denies falls in past 6 months Restrictions Weight Bearing Restrictions Per Provider Order: No      Mobility  Bed Mobility Overal bed mobility: Modified Independent             General bed mobility comments: HOB up,  used rail    Transfers Overall transfer level: Needs assistance Equipment used: Rolling walker (2 wheels) Transfers: Sit to/from Stand, Bed to chair/wheelchair/BSC Sit to Stand: Contact guard assist   Step pivot transfers: Contact guard assist       General transfer comment: steady with STS, pt took several pivotal steps to recliner with RW, no loss of balance, ambulation deferred 2* dyspnea with transfer, SpO2 93% on 2L with activity    Ambulation/Gait                  Stairs            Wheelchair Mobility     Tilt Bed    Modified Rankin (Stroke Patients Only)       Balance Overall balance assessment: No apparent balance deficits (not formally assessed)                                           Pertinent Vitals/Pain Pain Assessment Pain Assessment: No/denies pain    Home Living Family/patient expects to be discharged to:: Private residence Living Arrangements: Non-relatives/Friends Available Help at Discharge: Friend(s) Type of Home: House Home Access: Stairs to enter   Secretary/administrator of Steps: 4   Home Layout: One level Home Equipment: Rollator (4 wheels);Tub bench Additional Comments: 2L home O2 prn    Prior Function Prior Level of Function : Independent/Modified Independent;Driving             Mobility Comments: Pt reports that she has  had a rollater since 12/24 but does not use it ADLs Comments: Pt Ind with ADLs/elfcare, drives, light meal prep and takes care of her dog     Extremity/Trunk Assessment   Upper Extremity Assessment Upper Extremity Assessment: Overall WFL for tasks assessed    Lower Extremity Assessment Lower Extremity Assessment: Overall WFL for tasks assessed    Cervical / Trunk Assessment Cervical / Trunk Assessment: Normal  Communication   Communication Communication: No apparent difficulties  Cognition Arousal: Alert Behavior During Therapy: WFL for tasks  assessed/performed Overall Cognitive Status: Within Functional Limits for tasks assessed                                 General Comments: pt very pleasant        General Comments      Exercises     Assessment/Plan    PT Assessment Patient needs continued PT services  PT Problem List Cardiopulmonary status limiting activity;Decreased activity tolerance       PT Treatment Interventions Therapeutic activities;Gait training;Patient/family education    PT Goals (Current goals can be found in the Care Plan section)  Acute Rehab PT Goals Patient Stated Goal: to be able to breathe PT Goal Formulation: With patient Time For Goal Achievement: 11/12/23 Potential to Achieve Goals: Good    Frequency Min 1X/week     Co-evaluation               AM-PAC PT "6 Clicks" Mobility  Outcome Measure Help needed turning from your back to your side while in a flat bed without using bedrails?: None Help needed moving from lying on your back to sitting on the side of a flat bed without using bedrails?: A Little Help needed moving to and from a bed to a chair (including a wheelchair)?: A Little Help needed standing up from a chair using your arms (e.g., wheelchair or bedside chair)?: None Help needed to walk in hospital room?: A Little Help needed climbing 3-5 steps with a railing? : A Little 6 Click Score: 20    End of Session Equipment Utilized During Treatment: Oxygen Activity Tolerance: Patient limited by fatigue Patient left: in chair;with call bell/phone within reach Nurse Communication: Mobility status PT Visit Diagnosis: Difficulty in walking, not elsewhere classified (R26.2)    Time: 1610-9604 PT Time Calculation (min) (ACUTE ONLY): 11 min   Charges:   PT Evaluation $PT Eval Moderate Complexity: 1 Mod   PT General Charges $$ ACUTE PT VISIT: 1 Visit         Tamala Ser PT 10/29/2023  Acute Rehabilitation Services  Office  [phone]

## 2023-10-29 NOTE — TOC Initial Note (Signed)
Transition of Care [facility]) - Initial/Assessment Note    Patient Details  Name: [name] MRN: [id number] Date of Birth: September 12, 1956  [facility] CM/SW Contact:    Otelia Santee, LCSW Phone Number: 10/29/2023, 3:08 PM  Clinical Narrative:                 Pt from home alone. Pt has rollator and 2L of home O2 with [employer]. Pt agreeable to recommendation for [facility] and does not have a preference for agency. HHPT/OT arranged with [facility]. HH orders will need to be placed prior to discharge. CSW discussed food insecurity concerns. Pt shares she receives $23 in EBT benefits. Pt also receives $55 for food from [employer]. Pt shares with her limited income and cost of food pt often runs out of food and is unable to afford more. Pt agreeable to having resources placed on discharge paperwork Resources have been added to AVS.   Expected Discharge Plan: Home w Home Health Services Barriers to Discharge: No Barriers Identified   Patient Goals and CMS Choice Patient states their goals for this hospitalization and ongoing recovery are:: To return home CMS Medicare.gov Compare Post Acute Care list provided to:: Patient Choice offered to / list presented to : Patient [place] ownership interest in [facility].provided to::  (NA)    Expected Discharge Plan and Services In-house Referral: Clinical Social Work Discharge Planning Services: NA Post Acute Care Choice: Home Health Living arrangements for the past 2 months: Single Family Home                 DME Arranged: N/A DME Agency: NA       HH Arranged: PT, OT HH Agency: [facility] Date HH Agency Contacted: 10/29/23 Time HH Agency Contacted: 1502 Representative spoke with at [facility] Agency: Amy  Prior Living Arrangements/Services Living arrangements for the past 2 months: Single Family Home Lives with:: Self Patient language and need for interpreter reviewed:: Yes Do you feel safe going back to the  place where you live?: Yes      Need for Family Participation in Patient Care: No (Comment) Care giver support system in place?: No (comment) Current home services: DME (Rollator; 2L O2 w/ [employer]) Criminal Activity/Legal Involvement Pertinent to Current Situation/Hospitalization: No - Comment as needed  Activities of Daily Living   ADL Screening (condition at time of admission) Independently performs ADLs?: No Does the patient have a NEW difficulty with bathing/dressing/toileting/self-feeding that is expected to last >3 days?: Yes (Initiates electronic notice to provider for possible OT consult) Does the patient have a NEW difficulty with getting in/out of bed, walking, or climbing stairs that is expected to last >3 days?: Yes (Initiates electronic notice to provider for possible PT consult) Does the patient have a NEW difficulty with communication that is expected to last >3 days?: No Is the patient deaf or have difficulty hearing?: No Does the patient have difficulty seeing, even when wearing glasses/contacts?: No Does the patient have difficulty concentrating, remembering, or making decisions?: No  Permission Sought/Granted Permission sought to share information with : Oceanographer granted to share information with : Yes, Verbal Permission Granted     Permission granted to share info w AGENCY: [facility]        Emotional Assessment Appearance:: Appears stated age Attitude/Demeanor/Rapport: Engaged Affect (typically observed): Pleasant, Accepting Orientation: : Oriented to Self, Oriented to Place, Oriented to  Time, Oriented to Situation Alcohol / Substance Use: Not Applicable  Psych Involvement: No (comment)  Admission diagnosis:  COPD exacerbation (HCC) [J44.1] CAP (community acquired pneumonia) [J18.9] Pneumonia due to infectious organism, unspecified laterality, unspecified part of lung [J18.9] Acute hypoxic respiratory failure (HCC) [J96.01] Patient  Active Problem List   Diagnosis Date Noted   Community acquired pneumonia 10/03/2023   CAP (community acquired pneumonia) 10/01/2023   Esophageal dysphagia 05/11/2023   Epigastric pain 10/09/2022   Hiatal hernia 10/09/2022   Colon cancer screening 10/09/2022   History of hepatitis C 10/09/2022   Chest pain 10/09/2022   Pill dysphagia 10/09/2022   Abnormal ultrasound of liver 10/09/2022   Hepatitis B core antibody positive 10/09/2022   Grief reaction 01/24/2022   Allergic rhinitis 10/27/2021   Chronic respiratory failure with hypoxia (HCC) 10/06/2021   Chronic rhinitis 10/06/2021   Pleural effusion 10/06/2021   Acute exacerbation of COPD with asthma (HCC) 09/18/2021   SOB (shortness of breath) 09/18/2021   Anxiety    COPD GOLD 3  06/29/2021   Adenocarcinoma of right lung, 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   Adenocarcinoma of left lung, 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   Lung mass 03/13/2018   Chronic bronchitis (HCC) 02/04/2018   Left upper lobe pulmonary nodule 02/04/2018   Vaccine counseling 11/13/2017   Cirrhosis (HCC) 11/13/2017   Arthritis of hand 04/17/2017   Hepatitis C, chronic (HCC) 03/14/2015   Tobacco use disorder 01/21/2014   Tremor 10/14/2012   Migraine headache 08/01/2010   Depression, major, single episode, moderate (HCC) 03/19/2008   GERD 03/19/2008   NEPHROLITHIASIS, HX OF 03/19/2008   PCP:  Shirline Frees, NP Pharmacy:   [id number] [place], [place] - [address] [place] [address] Phone: [phone] Fax: [phone]     Social Drivers of Health (SDOH) Social History: SDOH Screenings   Food Insecurity: Food Insecurity Present (10/27/2023)  Housing: Low Risk  (10/27/2023)  Transportation Needs: No Transportation Needs (10/27/2023)  Utilities: Not At Risk (10/27/2023)  Alcohol Screen: Low Risk  (08/15/2022)  Depression (PHQ2-9): Low Risk  (09/27/2023)  Financial Resource Strain: Low  Risk  (08/15/2022)  Physical Activity: Insufficiently Active (08/15/2022)  Social Connections: Moderately Isolated (10/27/2023)  Stress: No Stress Concern Present (08/15/2022)  Tobacco Use: Medium Risk (10/27/2023)   SDOH Interventions: Food Insecurity Interventions: [employer] Provided, Inpatient TOC Social Connections Interventions: Inpatient TOC, Intervention Not Indicated   Readmission Risk Interventions    10/29/2023    3:00 PM 10/03/2023    1:36 PM  Readmission Risk Prevention Plan  Post Dischage Appt Complete Complete  Medication Screening Complete Complete  Transportation Screening Complete Complete

## 2023-10-29 NOTE — TOC CM/SW Note (Signed)
[facility] Quality rating ???? Patient survey rating ????   [facility] Quality rating ????? Patient survey rating???   [facility] [phone] Quality rating???? Patient survey rating????   [facility] [phone] Quality rating???? Patient survey rating????   [facility] [phone] Quality rating ???? Patient survey rating???   [facility] Quality rating??? Patient survey rating???   [facility] [phone] Quality rating??? Patient survey rating ????   [facility] [phone] Quality rating ????   [facility] Quality rating ??? Patient survey rating ????   [facility] Quality rating ??? Patient survey rating???   [facility] Quality rating ????? Patient survey rating ???   [facility] [phone] Quality rating ????? Patient survey rating ????

## 2023-10-29 NOTE — Progress Notes (Signed)
[facility]                                                                               [name], is a 68 y.o. female, DOB - 21-May-1956, NWG:[id number] Admit date - 10/27/2023    Outpatient Primary MD for the patient is Nature conservation officer, Kandee Keen, NP  LOS - 1  days    Brief summary    [name] is a 68 y.o. female with medical history significant of lung cancer status post right lower lobectomy and left upper lobectomy (in remission for 5 years), COPD, chronic hypoxic respiratory failure on 2 L nasal cannula at home, anxiety and depression, hiatal hernia, essential tremors presenting to the ED with shortness of breath and wheezing   Patient reports that she has a history of lung cancer for which she underwent right lower lobectomy and left upper lobectomy about 4-1/2 years ago.  She has since followed pulmonology, Dr. Sherene Sires, and oncology, Dr. Shirline Frees.  Her cancer has been in remission for about 5 years now and she will be going for a CT scan in October for continued surveillance.   Chest x-ray showing right middle lobe pneumonia. CTA chest without any evidence for PE, though did show patchy heterogenous opacities in the middle lobe and right upper lobe concerning for pneumonia. She was started on IV fluids and started on ceftriaxone and azithromycin for CAP coverage.   Assessment & Plan    Assessment and Plan:   Community acquired pneumonia:  Superimposed on chronic respiratory failure sec to COPD, currently on 2 lit of [place] oxygen.  Continue with IV ceftriaxone and IV azithromycin.  Cough medication Mucinex added.  Follow up blood cultures and sputum cultures.  Duonebs as needed.  Urine strep pneumo antigen is negative.   Therapy evaluations will be ordered.   Acute copd exacerbation;  Slowly improving.  No wheezing heard today.  On 2 lit of [place] oxygen, continue with prednisone for another 4 days.   H/o Lung ca S/p lobectomy  Follows up with Dr Arbutus Ped  and Dr Sherene Sires.     Normocytic anemia Monitor.    Anxiety and depression:  Resume home meds.   Elevated BNP Echocardiogram showing preserved LVEF, with grade 1 diastolic dysfunction. There is moderately elevated pulmonary artery systolic  pressure. The estimated right ventricular systolic pressure is 50.0 mmHg. There is moderate mitral valve stenosis.  Pt denies any pedal edema.  Continue to monitor.     Estimated body mass index is 27.62 kg/m as calculated from the following:   Height as of this encounter: 5\' 4"  (1.626 m).   Weight as of this encounter: 73 kg.  Code Status: DNR limited DVT Prophylaxis:  enoxaparin (LOVENOX) injection 40 mg Start: 10/27/23 2200   Level of Care: Level of care: Med-Surg Family Communication: none at bedside Disposition Plan:     Remains inpatient appropriate:  IV antibiotics.   Procedures:  None.   Consultants:   None.   Antimicrobials:   Anti-infectives (From admission, onward)    Start     Dose/Rate Route Frequency Ordered Stop   10/28/23 1300  cefTRIAXone (ROCEPHIN) 2 g in sodium chloride 0.9 %  100 mL IVPB        2 g 200 mL/hr over 30 Minutes Intravenous Every 24 hours 10/27/23 1731 11/01/23 1259   10/28/23 1000  azithromycin (ZITHROMAX) tablet 500 mg        500 mg Oral Daily 10/27/23 1731 11/01/23 0959   10/27/23 1300  cefTRIAXone (ROCEPHIN) 2 g in sodium chloride 0.9 % 100 mL IVPB        2 g 200 mL/hr over 30 Minutes Intravenous Once 10/27/23 1257 10/27/23 1404   10/27/23 1300  azithromycin (ZITHROMAX) 500 mg in sodium chloride 0.9 % 250 mL IVPB        500 mg 250 mL/hr over 60 Minutes Intravenous  Once 10/27/23 1257 10/27/23 1726        Medications  Scheduled Meds:  albuterol  2.5 mg Nebulization TID   azithromycin  500 mg Oral Daily   dextromethorphan-guaiFENesin  1 tablet Oral BID   enoxaparin (LOVENOX) injection  40 mg Subcutaneous Q24H   fluticasone furoate-vilanterol  1 puff Inhalation Daily   pantoprazole  40 mg  Oral Daily   PARoxetine  40 mg Oral Daily   predniSONE  40 mg Oral Q breakfast   saccharomyces boulardii  250 mg Oral BID   traZODone  100 mg Oral QHS   umeclidinium bromide  1 puff Inhalation Daily   Continuous Infusions:  cefTRIAXone (ROCEPHIN)  IV 2 g (10/29/23 1213)   PRN Meds:.acetaminophen, clonazePAM, ibuprofen, ipratropium-albuterol    Subjective:   [name] was seen and examined today. Slowly improving.   Objective:   Vitals:   10/29/23 0454 10/29/23 0830 10/29/23 1224 10/29/23 1249  BP: 139/71   (!) 114/58  Pulse: 78   81  Resp: 14   18  Temp: 98 F (36.7 C)   98 F (36.7 C)  TempSrc: Oral   Oral  SpO2: 100% 98% 97% 98%  Weight:      Height:       No intake or output data in the 24 hours ending 10/29/23 1559  Filed Weights   10/27/23 1128  Weight: 73 kg     Exam General exam: Appears calm and comfortable  Respiratory system: Clear to auscultation. Respiratory effort normal. Cardiovascular system: S1 & S2 heard, RRR.  Gastrointestinal system: Abdomen is nondistended, soft and nontender.  Central nervous system: Alert and oriented. No focal neurological deficits. Extremities: Symmetric 5 x 5 power. Skin: No rashes, lesions or ulcers Psychiatry:  Mood & affect appropriate.     Data Reviewed:  I have personally reviewed following labs and imaging studies   CBC Lab Results  Component Value Date   WBC 10.1 10/27/2023   RBC 4.23 10/27/2023   HGB 11.2 (L) 10/27/2023   HCT 35.5 (L) 10/27/2023   MCV 83.9 10/27/2023   MCH 26.5 10/27/2023   PLT 211 10/27/2023   MCHC 31.5 10/27/2023   RDW 13.9 10/27/2023   LYMPHSABS 1.4 07/15/2023   MONOABS 0.6 07/15/2023   EOSABS 0.2 07/15/2023   BASOSABS 0.1 07/15/2023     Last metabolic panel Lab Results  Component Value Date   NA 138 10/27/2023   K 4.0 10/27/2023   CL 104 10/27/2023   CO2 24 10/27/2023   BUN 13 10/27/2023   CREATININE 0.87 10/27/2023   GLUCOSE 132 (H) 10/27/2023   GFRNONAA  >60 10/27/2023   GFRAA 76 07/14/2020   CALCIUM 8.8 (L) 10/27/2023   PHOS 3.2 09/18/2021   PROT 6.6 10/27/2023   ALBUMIN 3.5 10/27/2023  BILITOT 0.7 10/27/2023   ALKPHOS 81 10/27/2023   AST 21 10/27/2023   ALT 21 10/27/2023   ANIONGAP 10 10/27/2023    CBG (last 3)  No results for input(s): "GLUCAP" in the last 72 hours.    Coagulation Profile: No results for input(s): "INR", "PROTIME" in the last 168 hours.   Radiology Studies: ECHOCARDIOGRAM COMPLETE Result Date: 10/29/2023    ECHOCARDIOGRAM REPORT   Patient Name:   [name] [facility] Date of Exam: 10/29/2023 Medical Rec #:  [id number]            Height:       64.0 in Accession #:    [id number]           Weight:       160.9 lb Date of Birth:  11-06-1955           BSA:          1.784 m Patient Age:    67 years             BP:           139/71 mmHg Patient Gender: F                    HR:           81 bpm. Exam Location:  Inpatient Procedure: 2D Echo, Cardiac Doppler and Color Doppler Indications:    Dyspnea  History:        Patient has no prior history of Echocardiogram examinations.                 COPD; Risk Factors:Lung Cancer.  Sonographer:    AMY Referring Phys: [id number] Ladrea Holladay IMPRESSIONS  1. Left ventricular ejection fraction, by estimation, is 60 to 65%. The left ventricle has normal function. The left ventricle has no regional wall motion abnormalities. Left ventricular diastolic parameters are consistent with Grade I diastolic dysfunction (impaired relaxation).  2. Right ventricular systolic function is normal. The right ventricular size is normal. There is moderately elevated pulmonary artery systolic pressure. The estimated right ventricular systolic pressure is 50.0 mmHg.  3. Left atrial size was mildly dilated.  4. The mitral valve is abnormal in structure, some thickening and possibly rheumatic but not very well-visualized. Trivial mitral valve regurgitation. Moderate mitral stenosis. The mean mitral valve gradient is 8.0  mmHg, MVA 1.16 cm^2. Consider TEE for better review of mitral valve.  5. The aortic valve is tricuspid. Aortic valve regurgitation is not visualized. No aortic stenosis is present.  6. The inferior vena cava is normal in size with <50% respiratory variability, suggesting right atrial pressure of 8 mmHg. FINDINGS  Left Ventricle: Left ventricular ejection fraction, by estimation, is 60 to 65%. The left ventricle has normal function. The left ventricle has no regional wall motion abnormalities. The left ventricular internal cavity size was normal in size. There is  no left ventricular hypertrophy. Left ventricular diastolic parameters are consistent with Grade I diastolic dysfunction (impaired relaxation). Right Ventricle: The right ventricular size is normal. No increase in right ventricular wall thickness. Right ventricular systolic function is normal. There is moderately elevated pulmonary artery systolic pressure. The tricuspid regurgitant velocity is 3.24 m/s, and with an assumed right atrial pressure of 8 mmHg, the estimated right ventricular systolic pressure is 50.0 mmHg. Left Atrium: Left atrial size was mildly dilated. Right Atrium: Right atrial size was normal in size. Pericardium: There is no evidence of pericardial effusion. Mitral Valve: The mitral valve  is normal in structure. Trivial mitral valve regurgitation. Moderate mitral valve stenosis. MV peak gradient, 15.7 mmHg. The mean mitral valve gradient is 8.0 mmHg. Tricuspid Valve: The tricuspid valve is normal in structure. Tricuspid valve regurgitation is trivial. Aortic Valve: The aortic valve is tricuspid. Aortic valve regurgitation is not visualized. No aortic stenosis is present. Aortic valve mean gradient measures 4.0 mmHg. Aortic valve peak gradient measures 6.5 mmHg. Aortic valve area, by VTI measures 2.76 cm. Pulmonic Valve: The pulmonic valve was normal in structure. Pulmonic valve regurgitation is trivial. Aorta: The aortic root is normal  in size and structure. Venous: The inferior vena cava is normal in size with less than 50% respiratory variability, suggesting right atrial pressure of 8 mmHg. IAS/Shunts: No atrial level shunt detected by color flow Doppler.  LEFT VENTRICLE PLAX 2D LVIDd:         3.50 cm     Diastology LVIDs:         1.90 cm     LV e' medial:    6.20 cm/s LV PW:         0.90 cm     LV E/e' medial:  26.0 LV IVS:        1.10 cm     LV e' lateral:   6.53 cm/s LVOT diam:     2.00 cm     LV E/e' lateral: 24.7 LV SV:         68 LV SV Index:   38 LVOT Area:     3.14 cm  LV Volumes (MOD) LV vol d, MOD A2C: 48.1 ml LV vol d, MOD A4C: 53.7 ml LV vol s, MOD A2C: 16.2 ml LV vol s, MOD A4C: 15.1 ml LV SV MOD A2C:     31.9 ml LV SV MOD A4C:     53.7 ml LV SV MOD BP:      36.3 ml RIGHT VENTRICLE            IVC RV Basal diam:  2.20 cm    IVC diam: 1.80 cm RV S prime:     8.70 cm/s TAPSE (M-mode): 1.8 cm LEFT ATRIUM             Index        RIGHT ATRIUM           Index LA Vol (A2C):   34.7 ml 19.45 ml/m  RA Area:     10.40 cm LA Vol (A4C):   39.4 ml 22.09 ml/m  RA Volume:   21.40 ml  12.00 ml/m LA Biplane Vol: 38.5 ml 21.58 ml/m  AORTIC VALVE                    PULMONIC VALVE AV Area (Vmax):    2.82 cm     PV Vmax:          0.99 m/s AV Area (Vmean):   2.67 cm     PV Peak grad:     3.9 mmHg AV Area (VTI):     2.76 cm     PR End Diast Vel: 9.49 msec AV Vmax:           127.00 cm/s AV Vmean:          88.300 cm/s AV VTI:            0.246 m AV Peak Grad:      6.5 mmHg AV Mean Grad:      4.0 mmHg LVOT Vmax:  114.00 cm/s LVOT Vmean:        75.100 cm/s LVOT VTI:          0.216 m LVOT/AV VTI ratio: 0.88  AORTA Ao Root diam: 2.60 cm Ao Asc diam:  2.90 cm MITRAL VALVE                TRICUSPID VALVE MV Area (PHT): 2.50 cm     TR Peak grad:   42.0 mmHg MV Area VTI:   1.16 cm     TR Vmax:        324.00 cm/s MV Peak grad:  15.7 mmHg MV Mean grad:  8.0 mmHg     SHUNTS MV Vmax:       1.98 m/s     Systemic VTI:  0.22 m MV Vmean:      136.0 cm/s    Systemic Diam: 2.00 cm MV Decel Time: 304 msec MV E velocity: 161.00 cm/s MV A velocity: 171.00 cm/s MV E/A ratio:  0.94 Dalton McleanMD Electronically signed by Wilfred Lacy Signature Date/Time: 10/29/2023/1:48:03 PM    Final        Kathlen Mody M.D. [facility] 10/29/2023, 3:59 PM  Available via Epic secure chat 7am-7pm After 7 pm, please refer to night coverage provider listed on amion.

## 2023-10-30 DIAGNOSIS — J189 Pneumonia, unspecified organism: Secondary | ICD-10-CM | POA: Diagnosis not present

## 2023-10-30 LAB — CBC WITH DIFFERENTIAL/PLATELET
Abs Immature Granulocytes: 0.08 10*3/uL — ABNORMAL HIGH (ref 0.00–0.07)
Basophils Absolute: 0 10*3/uL (ref 0.0–0.1)
Basophils Relative: 0 %
Eosinophils Absolute: 0 10*3/uL (ref 0.0–0.5)
Eosinophils Relative: 1 %
HCT: 34.1 % — ABNORMAL LOW (ref 36.0–46.0)
Hemoglobin: 10.6 g/dL — ABNORMAL LOW (ref 12.0–15.0)
Immature Granulocytes: 1 %
Lymphocytes Relative: 26 %
Lymphs Abs: 1.8 10*3/uL (ref 0.7–4.0)
MCH: 26 pg (ref 26.0–34.0)
MCHC: 31.1 g/dL (ref 30.0–36.0)
MCV: 83.8 fL (ref 80.0–100.0)
Monocytes Absolute: 0.7 10*3/uL (ref 0.1–1.0)
Monocytes Relative: 11 %
Neutro Abs: 4.2 10*3/uL (ref 1.7–7.7)
Neutrophils Relative %: 61 %
Platelets: 237 10*3/uL (ref 150–400)
RBC: 4.07 MIL/uL (ref 3.87–5.11)
RDW: 14.1 % (ref 11.5–15.5)
WBC: 6.8 10*3/uL (ref 4.0–10.5)
nRBC: 0 % (ref 0.0–0.2)

## 2023-10-30 LAB — BASIC METABOLIC PANEL
Anion gap: 7 (ref 5–15)
BUN: 20 mg/dL (ref 8–23)
CO2: 26 mmol/L (ref 22–32)
Calcium: 8.7 mg/dL — ABNORMAL LOW (ref 8.9–10.3)
Chloride: 107 mmol/L (ref 98–111)
Creatinine, Ser: 0.78 mg/dL (ref 0.44–1.00)
GFR, Estimated: >60 mL/min (ref >60)
Glucose, Bld: 98 mg/dL (ref 70–99)
Potassium: 4.2 mmol/L (ref 3.5–5.1)
Sodium: 140 mmol/L (ref 135–145)

## 2023-10-30 NOTE — Plan of Care (Signed)

## 2023-10-30 NOTE — Plan of Care (Signed)
  Problem: Education: Goal: Knowledge of General Education information will improve Description: Including pain rating scale, medication(s)/side effects and non-pharmacologic comfort measures Outcome: Progressing   Problem: Clinical Measurements: Goal: Diagnostic test results will improve Outcome: Progressing Goal: Respiratory complications will improve Outcome: Progressing Goal: Cardiovascular complication will be avoided Outcome: Progressing   Problem: Activity: Goal: Risk for activity intolerance will decrease Outcome: Progressing   Problem: Nutrition: Goal: Adequate nutrition will be maintained Outcome: Progressing   Problem: Elimination: Goal: Will not experience complications related to bowel motility Outcome: Progressing Goal: Will not experience complications related to urinary retention Outcome: Progressing   Problem: Pain Managment: Goal: General experience of comfort will improve and/or be controlled Outcome: Progressing   Problem: Safety: Goal: Ability to remain free from injury will improve Outcome: Progressing   Problem: Skin Integrity: Goal: Risk for impaired skin integrity will decrease Outcome: Progressing   Problem: Activity: Goal: Ability to tolerate increased activity will improve Outcome: Progressing   Problem: Respiratory: Goal: Ability to maintain a clear airway will improve Outcome: Progressing Goal: Levels of oxygenation will improve Outcome: Progressing Goal: Ability to maintain adequate ventilation will improve Outcome: Progressing   Problem: Activity: Goal: Ability to tolerate increased activity will improve Outcome: Progressing   Problem: Clinical Measurements: Goal: Ability to maintain a body temperature in the normal range will improve Outcome: Progressing   Problem: Respiratory: Goal: Ability to maintain adequate ventilation will improve Outcome: Progressing Goal: Ability to maintain a clear airway will improve Outcome:  Progressing

## 2023-10-30 NOTE — Progress Notes (Addendum)
[facility] PROGRESS NOTE  [name] (DOB: [birth year]) WJX:[id number] PCP: Shirline Frees, NP  Brief Narrative: [name] is a 68 y.o. female with medical history significant of lung cancer status post right lower lobectomy and left upper lobectomy (in remission for 5 years), COPD, chronic hypoxic respiratory failure on 2 L nasal cannula at home, anxiety and depression, hiatal hernia, essential tremors presenting to the ED with shortness of breath and wheezing    Patient reports that she has a history of lung cancer for which she underwent right lower lobectomy and left upper lobectomy about 4-1/2 years ago.  She has since followed pulmonology, Dr. Sherene Sires, and oncology, Dr. Shirline Frees.  Her cancer has been in remission for about 5 years now and she will be going for a CT scan in October for continued surveillance.    Chest x-ray showing right middle lobe pneumonia. CTA chest without any evidence for PE, though did show patchy heterogenous opacities in the middle lobe and right upper lobe concerning for pneumonia. She was started on IV fluids and started on ceftriaxone and azithromycin for CAP coverage.   Subjective: Breathing slightly worse than her baseline, no chest pain or other new symptoms. Not ready to go home.   Objective: BP 129/62   Pulse 86   Temp 98 F (36.7 C)   Resp 20   Ht 5\' 4"  (1.626 m)   Wt 73 kg   SpO2 98%   BMI 27.62 kg/m   Gen: No distress Pulm: Diminished with end expiratory wheeze, no crackles.  CV: RRR, no MRG or edema GI: Soft, NT, ND, +BS Neuro: Alert and oriented. No new focal deficits. Ext: Warm, no deformities. Skin: No rashes, lesions or ulcers on visualized skin   Assessment & Plan: Chronic hypoxic respiratory failure: Remains on 2L O2.   Community acquired pneumonia:  Continue with IV ceftriaxone and IV azithromycin.  Cough medication Mucinex added.  Follow up blood cultures and sputum cultures.   AECOPD:  - Continue  prednisone to complete course - Continue BDs   Hx lung CA s/p lobectomy.  - Continue routine follow up with oncology, Dr Arbutus Ped, and pulmonary, Dr Sherene Sires.     Normocytic anemia Monitor.     Anxiety and depression:  Resume home meds.    Elevated BNP: Echocardiogram showing preserved LVEF, with grade 1 diastolic dysfunction. There is moderately elevated pulmonary artery systolic  pressure. The estimated right ventricular systolic pressure is 50.0 mmHg. There is moderate mitral valve stenosis. Suspect this is related to chronic hypoxic respiratory failure. Doubt she'd benefit from diuresis.  Tyrone Nine, MD [facility] [email or web address] 10/30/2023, 5:18 PM

## 2023-10-31 DIAGNOSIS — J189 Pneumonia, unspecified organism: Secondary | ICD-10-CM | POA: Diagnosis not present

## 2023-10-31 MED ORDER — ALBUTEROL SULFATE (2.5 MG/3ML) 0.083% IN NEBU: 2.5000 mg | INHALATION_SOLUTION | Freq: Two times a day (BID) | RESPIRATORY_TRACT | Status: DC

## 2023-10-31 NOTE — Plan of Care (Signed)

## 2023-10-31 NOTE — Progress Notes (Addendum)
Physical Therapy Treatment Patient Details Name: [name] MRN: [id number] DOB: [birth year] Today's Date: 10/31/2023   History of Present Illness [name] is a 68 y.o. female presented to the ED with shortness of breath and wheezing. Dx of PNA. Pt with medical history significant of lung cancer status post right lower lobectomy and left upper lobectomy (in remission for 5 years), COPD, chronic hypoxic respiratory failure on 2 L nasal cannula at home, anxiety and depression, hiatal hernia, essential tremors    PT Comments  Pt is mobilizing well, she ambulated 150' without an assistive device, no loss of balance, SpO2 97% on 2L O2 walking (2L is her baseline at home), HR 109 walking. Pt was encouraged to gradually increase activity as tolerated at home, and to rest with pursed lip breathing when she gets short of breath. PT goals have been met, she is ready to DC home from a PT standpoint. PT signing off.      If plan is discharge home, recommend the following:     Can travel by private vehicle        Equipment Recommendations  None recommended by PT    Recommendations for Other Services       Precautions / Restrictions Precautions Precautions: Other (comment) Precaution Comments: DOE, watch O2 SATs, denies falls in past 6 months Restrictions Weight Bearing Restrictions Per Provider Order: No     Mobility  Bed Mobility Overal bed mobility: Modified Independent             General bed mobility comments: HOB up, used rail    Transfers Overall transfer level: Independent Equipment used: None Transfers: Sit to/from Stand Sit to Stand: Independent           General transfer comment: steady    Ambulation/Gait Ambulation/Gait assistance: Independent Gait Distance (Feet): 150 Feet Assistive device: None Gait Pattern/deviations: WFL(Within Functional Limits) Gait velocity: WFL     General Gait Details: steady, no loss of balance, SpO2 96% on  2L O2 walking, HR 109, 2 brief standing rest breaks 2* 3/4 dyspnea   Stairs             Wheelchair Mobility     Tilt Bed    Modified Rankin (Stroke Patients Only)       Balance Overall balance assessment: No apparent balance deficits (not formally assessed)                                          Cognition Arousal: Alert Behavior During Therapy: WFL for tasks assessed/performed Overall Cognitive Status: Within Functional Limits for tasks assessed                                 General Comments: pt very pleasant        Exercises      General Comments        Pertinent Vitals/Pain Pain Assessment Pain Assessment: No/denies pain    Home Living                          Prior Function            PT Goals (current goals can now be found in the care plan section) Acute Rehab PT Goals Patient Stated Goal: to be able to breathe, get  home to dog [name] PT Goal Formulation: All assessment and education complete, DC therapy Progress towards PT goals: Goals met/education completed, patient discharged from PT    Frequency    Min 1X/week      PT Plan      Co-evaluation              AM-PAC PT "6 Clicks" Mobility   Outcome Measure  Help needed turning from your back to your side while in a flat bed without using bedrails?: None Help needed moving from lying on your back to sitting on the side of a flat bed without using bedrails?: None Help needed moving to and from a bed to a chair (including a wheelchair)?: None Help needed standing up from a chair using your arms (e.g., wheelchair or bedside chair)?: None Help needed to walk in hospital room?: None Help needed climbing 3-5 steps with a railing? : None 6 Click Score: 24    End of Session Equipment Utilized During Treatment: Oxygen Activity Tolerance: Patient tolerated treatment well Patient left: in bed;with call bell/phone within reach Nurse  Communication: Mobility status PT Visit Diagnosis: Difficulty in walking, not elsewhere classified (R26.2)     Time: 4782-9562 PT Time Calculation (min) (ACUTE ONLY): 8 min  Charges:    $Gait Training: 8-22 mins PT General Charges $$ ACUTE PT VISIT: 1 Visit                     Tamala Ser PT 10/31/2023  Acute Rehabilitation Services  Office [phone]

## 2023-10-31 NOTE — Discharge Summary (Signed)
Physician Discharge Summary   Patient: [name] MRN: [id number] DOB: Feb 23, 1956  Admit date:     10/27/2023  Discharge date: 10/31/23  Discharge Physician: Tyrone Nine   PCP: Shirline Frees, NP   Recommendations at discharge:  Follow up with PCP and/or pulmonary in 1-2 weeks for follow up. Suggest repeat chest CT after convalescence to confirm resolution. Suggest repeat echocardiogram vs. cardiology referral for   Discharge Diagnoses: Principal Problem:   CAP (community acquired pneumonia)  Hospital Course: [name] is a 68 y.o. female with medical history significant of lung cancer status post right lower lobectomy and left upper lobectomy (in remission for 5 years), COPD, chronic hypoxic respiratory failure on 2 L nasal cannula at home, anxiety and depression, hiatal hernia, essential tremors presenting to the ED with shortness of breath and wheezing    Patient reports that she has a history of lung cancer for which she underwent right lower lobectomy and left upper lobectomy about 4-1/2 years ago.  She has since followed pulmonology, Dr. Sherene Sires, and oncology, Dr. Shirline Frees.  Her cancer has been in remission for about 5 years now and she will be going for a CT scan in October for continued surveillance.    Chest x-ray showing right middle lobe pneumonia. CTA chest without any evidence for PE, though did show patchy heterogenous opacities in the middle lobe and right upper lobe concerning for pneumonia. She was started on IV fluids and started on ceftriaxone and azithromycin for CAP coverage.   Improved with antibiotics and steroids, back near baseline.   Assessment and Plan: Chronic hypoxic respiratory failure: Remains on 2L O2, back near her baseline.    Community acquired pneumonia: Completed 5 days of IV ceftriaxone and IV azithromycin. Cultures negative.    AECOPD:  - Completed prednisone course - Continue BDs   Hx lung CA s/p lobectomy.  - Continue  routine follow up with oncology, Dr Arbutus Ped, and pulmonary, Dr Sherene Sires.     Normocytic anemia Monitor.     Anxiety and depression:  Resume home meds.    Elevated BNP: Echocardiogram showing preserved LVEF, with grade 1 diastolic dysfunction. There is moderately elevated pulmonary artery systolic  pressure. The estimated right ventricular systolic pressure is 50.0 mmHg. There is moderate mitral valve stenosis. Suspect this is related to chronic hypoxic respiratory failure. Doubt she'd benefit from diuresis.  Consultants: None Procedures performed: None  Disposition: Home Diet recommendation:  Cardiac diet DISCHARGE MEDICATION: Allergies as of 10/31/2023       Reactions   Aspirin Shortness Of Breath   Trelegy Ellipta [fluticasone-umeclidin-vilant] Shortness Of Breath   She reports that is made her cough worse.    Morphine Sulfate Other (See Comments)   RED STREAKS ON ARMS.   Oxycodone Itching, Rash   Glycopyrrolate Other (See Comments)   UNSPECIFIED REACTION  pt unsure if she is allergic.   Hydrocodone Itching   Lactose Intolerance (gi) Nausea And Vomiting   Milk only.Patient states that she can eat cheese.   Penicillins Other (See Comments)    Heartburn, upset stomach only & tolerated AMOXIL after this reaction   Sulfa Antibiotics Tinitus        Medication List     TAKE these medications    albuterol 108 (90 Base) MCG/ACT inhaler Commonly known as: VENTOLIN HFA Inhale 2 puffs into the lungs every 6 (six) hours as needed for wheezing or shortness of breath.   albuterol (2.5 MG/3ML) 0.083% nebulizer solution Commonly known as: PROVENTIL  Take 3 mLs (2.5 mg total) by nebulization every 4 (four) hours as needed for wheezing or shortness of breath.   Breztri Aerosphere 160-9-4.8 MCG/ACT Aero Generic drug: Budeson-Glycopyrrol-Formoterol Inhale 2 puffs into the lungs 2 (two) times daily. What changed: how much to take   clonazePAM 0.5 MG tablet Commonly known as:  KLONOPIN Take 1 tablet (0.5 mg total) by mouth 3 (three) times daily as needed. for anxiety   esomeprazole 40 MG capsule Commonly known as: NEXIUM Take 1 capsule (40 mg total) by mouth daily. What changed: when to take this   guaiFENesin 600 MG 12 hr tablet Commonly known as: MUCINEX Take 600 mg by mouth daily.   PARoxetine 40 MG tablet Commonly known as: PAXIL Take 1 tablet (40 mg total) by mouth daily.   propranolol 20 MG tablet Commonly known as: INDERAL Take 1 capsule (60 mg total) by mouth daily.   SUMAtriptan 50 MG tablet Commonly known as: IMITREX Take 50 mg by mouth.   traZODone 100 MG tablet Commonly known as: DESYREL Take 1 tablet (100 mg total) by mouth at bedtime as needed. for sleep. Need physical exam for further refills        Follow-up Information     [facility] Follow up.   Why: [name] will provide home health physical and occupational therapy Contact information: [address] [place] [address] [phone]                Discharge Exam: Filed Weights   10/27/23 1128  Weight: 73 kg  BP 123/78 (BP Location: Right Arm)   Pulse 76   Temp 98.1 F (36.7 C)   Resp 20   Ht 5\' 4"  (1.626 m)   Wt 73 kg   SpO2 99%   BMI 27.62 kg/m   No distress, well-appearing pleasant female Cleared without crackles or wheezes RRR, no edema or JVD  Condition at discharge: stable  The results of significant diagnostics from this hospitalization (including imaging, microbiology, ancillary and laboratory) are listed below for reference.   Imaging Studies: ECHOCARDIOGRAM COMPLETE Result Date: 10/29/2023    ECHOCARDIOGRAM REPORT   Patient Name:   [name] [facility] Date of Exam: 10/29/2023 Medical Rec #:  [id number]            Height:       64.0 in Accession #:    [id number]           Weight:       160.9 lb Date of Birth:  Jul 31, 1956           BSA:          1.784 m Patient Age:    67 years             BP:           139/71  mmHg Patient Gender: F                    HR:           81 bpm. Exam Location:  Inpatient Procedure: 2D Echo, Cardiac Doppler and Color Doppler Indications:    Dyspnea  History:        Patient has no prior history of Echocardiogram examinations.                 COPD; Risk Factors:Lung Cancer.  Sonographer:    AMY Referring Phys: [id number] VIJAYA AKULA IMPRESSIONS  1. Left ventricular ejection fraction, by  estimation, is 60 to 65%. The left ventricle has normal function. The left ventricle has no regional wall motion abnormalities. Left ventricular diastolic parameters are consistent with Grade I diastolic dysfunction (impaired relaxation).  2. Right ventricular systolic function is normal. The right ventricular size is normal. There is moderately elevated pulmonary artery systolic pressure. The estimated right ventricular systolic pressure is 50.0 mmHg.  3. Left atrial size was mildly dilated.  4. The mitral valve is abnormal in structure, some thickening and possibly rheumatic but not very well-visualized. Trivial mitral valve regurgitation. Moderate mitral stenosis. The mean mitral valve gradient is 8.0 mmHg, MVA 1.16 cm^2. Consider TEE for better review of mitral valve.  5. The aortic valve is tricuspid. Aortic valve regurgitation is not visualized. No aortic stenosis is present.  6. The inferior vena cava is normal in size with <50% respiratory variability, suggesting right atrial pressure of 8 mmHg. FINDINGS  Left Ventricle: Left ventricular ejection fraction, by estimation, is 60 to 65%. The left ventricle has normal function. The left ventricle has no regional wall motion abnormalities. The left ventricular internal cavity size was normal in size. There is  no left ventricular hypertrophy. Left ventricular diastolic parameters are consistent with Grade I diastolic dysfunction (impaired relaxation). Right Ventricle: The right ventricular size is normal. No increase in right ventricular wall thickness. Right  ventricular systolic function is normal. There is moderately elevated pulmonary artery systolic pressure. The tricuspid regurgitant velocity is 3.24 m/s, and with an assumed right atrial pressure of 8 mmHg, the estimated right ventricular systolic pressure is 50.0 mmHg. Left Atrium: Left atrial size was mildly dilated. Right Atrium: Right atrial size was normal in size. Pericardium: There is no evidence of pericardial effusion. Mitral Valve: The mitral valve is normal in structure. Trivial mitral valve regurgitation. Moderate mitral valve stenosis. MV peak gradient, 15.7 mmHg. The mean mitral valve gradient is 8.0 mmHg. Tricuspid Valve: The tricuspid valve is normal in structure. Tricuspid valve regurgitation is trivial. Aortic Valve: The aortic valve is tricuspid. Aortic valve regurgitation is not visualized. No aortic stenosis is present. Aortic valve mean gradient measures 4.0 mmHg. Aortic valve peak gradient measures 6.5 mmHg. Aortic valve area, by VTI measures 2.76 cm. Pulmonic Valve: The pulmonic valve was normal in structure. Pulmonic valve regurgitation is trivial. Aorta: The aortic root is normal in size and structure. Venous: The inferior vena cava is normal in size with less than 50% respiratory variability, suggesting right atrial pressure of 8 mmHg. IAS/Shunts: No atrial level shunt detected by color flow Doppler.  LEFT VENTRICLE PLAX 2D LVIDd:         3.50 cm     Diastology LVIDs:         1.90 cm     LV e' medial:    6.20 cm/s LV PW:         0.90 cm     LV E/e' medial:  26.0 LV IVS:        1.10 cm     LV e' lateral:   6.53 cm/s LVOT diam:     2.00 cm     LV E/e' lateral: 24.7 LV SV:         68 LV SV Index:   38 LVOT Area:     3.14 cm  LV Volumes (MOD) LV vol d, MOD A2C: 48.1 ml LV vol d, MOD A4C: 53.7 ml LV vol s, MOD A2C: 16.2 ml LV vol s, MOD A4C: 15.1 ml LV SV MOD A2C:  31.9 ml LV SV MOD A4C:     53.7 ml LV SV MOD BP:      36.3 ml RIGHT VENTRICLE            IVC RV Basal diam:  2.20 cm    IVC  diam: 1.80 cm RV S prime:     8.70 cm/s TAPSE (M-mode): 1.8 cm LEFT ATRIUM             Index        RIGHT ATRIUM           Index LA Vol (A2C):   34.7 ml 19.45 ml/m  RA Area:     10.40 cm LA Vol (A4C):   39.4 ml 22.09 ml/m  RA Volume:   21.40 ml  12.00 ml/m LA Biplane Vol: 38.5 ml 21.58 ml/m  AORTIC VALVE                    PULMONIC VALVE AV Area (Vmax):    2.82 cm     PV Vmax:          0.99 m/s AV Area (Vmean):   2.67 cm     PV Peak grad:     3.9 mmHg AV Area (VTI):     2.76 cm     PR End Diast Vel: 9.49 msec AV Vmax:           127.00 cm/s AV Vmean:          88.300 cm/s AV VTI:            0.246 m AV Peak Grad:      6.5 mmHg AV Mean Grad:      4.0 mmHg LVOT Vmax:         114.00 cm/s LVOT Vmean:        75.100 cm/s LVOT VTI:          0.216 m LVOT/AV VTI ratio: 0.88  AORTA Ao Root diam: 2.60 cm Ao Asc diam:  2.90 cm MITRAL VALVE                TRICUSPID VALVE MV Area (PHT): 2.50 cm     TR Peak grad:   42.0 mmHg MV Area VTI:   1.16 cm     TR Vmax:        324.00 cm/s MV Peak grad:  15.7 mmHg MV Mean grad:  8.0 mmHg     SHUNTS MV Vmax:       1.98 m/s     Systemic VTI:  0.22 m MV Vmean:      136.0 cm/s   Systemic Diam: 2.00 cm MV Decel Time: 304 msec MV E velocity: 161.00 cm/s MV A velocity: 171.00 cm/s MV E/A ratio:  0.94 Dalton McleanMD Electronically signed by Wilfred Lacy Signature Date/Time: 10/29/2023/1:48:03 PM    Final    CT Angio Chest PE W and/or Wo Contrast Result Date: 10/27/2023 CLINICAL DATA:  Pulmonary embolism (PE) suspected, high prob. History of lung carcinoma. COPD. Recent pneumonia. Shortness of breath. * Tracking Code: BO * EXAM: CT ANGIOGRAPHY CHEST WITH CONTRAST TECHNIQUE: Multidetector CT imaging of the chest was performed using the standard protocol during bolus administration of intravenous contrast. Multiplanar CT image reconstructions and MIPs were obtained to evaluate the vascular anatomy. RADIATION DOSE REDUCTION: This exam was performed according to the departmental  dose-optimization program which includes automated exposure control, adjustment of the mA and/or kV according to patient size and/or use of iterative reconstruction technique.  CONTRAST:  75mL OMNIPAQUE IOHEXOL 350 MG/ML SOLN COMPARISON:  CT scan chest from 07/15/2023. FINDINGS: Cardiovascular: No evidence of embolism to the proximal subsegmental pulmonary artery level. Normal cardiac size. No pericardial effusion. No aortic aneurysm. There are minimal peripheral atherosclerotic vascular calcifications of thoracic aorta and its major branches. Mediastinum/Nodes: Visualized thyroid gland appears grossly unremarkable. No solid / cystic mediastinal masses. The esophagus is nondistended precluding optimal assessment. There are few mildly prominent mediastinal and hilar lymph nodes, which do not meet the size criteria for lymphadenopathy and appear grossly similar to the prior study, favoring benign etiology. No axillary lymphadenopathy by size criteria. Lungs/Pleura: The central tracheo-bronchial tree is patent. Mild upper lobe predominant centrilobular emphysematous changes noted. There are postsurgical changes in the right lung lower lobe characterized by linear area of fibrosis/scarring and surgical suture and compensatory emphysematous changes in the right lower lobe. There also postsurgical changes in the left lung from prior wedge resection. There are new patchy heterogeneous opacities mainly in the middle lobe and also scattered areas in the right upper lobe, concerning for pneumonia. Follow-up to clearing is recommended. No suspicious lung mass or lung collapse. No pleural effusion or pneumothorax. Upper Abdomen: There is a small sliding hiatal hernia. Visualized upper abdominal viscera within normal limits. Musculoskeletal: The visualized soft tissues of the chest wall are grossly unremarkable. No suspicious osseous lesions. There are mild multilevel degenerative changes in the visualized spine. Review of the  MIP images confirms the above findings. IMPRESSION: 1. No evidence of embolism to the proximal subsegmental pulmonary artery level. 2. New patchy heterogeneous opacities mainly in the middle lobe and also scattered areas in the right upper lobe, concerning for pneumonia. Follow-up to clearing is recommended. 3. Multiple other nonacute observations, as described above. Aortic Atherosclerosis (ICD10-I70.0) and Emphysema (ICD10-J43.9). Electronically Signed   By: Jules Schick M.D.   On: 10/27/2023 15:28   DG Chest Port 1 View Result Date: 10/27/2023 CLINICAL DATA:  History of lung cancer.  Shortness of breath. EXAM: PORTABLE CHEST 1 VIEW COMPARISON:  10/01/2023 FINDINGS: Lungs are hyperexpanded. Nodular density in the right mid lung is new in the interval. Surgical staple lines again noted in the parahilar left lung. The cardiopericardial silhouette is within normal limits for size. No acute bony abnormality. Telemetry leads overlie the chest. IMPRESSION: New nodular density in the right mid while this may be infectious/inflammatory, given the history of lung cancer, close follow-up recommended. CT chest without contrast may be warranted to further evaluate. No pulmonary edema or pleural effusion. Electronically Signed   By: Kennith Center M.D.   On: 10/27/2023 12:26    Microbiology: Results for orders placed or performed during the hospital encounter of 10/27/23  Respiratory (~20 pathogens) panel by PCR     Status: None   Collection Time: 10/27/23 11:33 AM   Specimen: Nasopharyngeal Swab; Respiratory  Result Value Ref Range Status   Adenovirus NOT DETECTED NOT DETECTED Final   Coronavirus 229E NOT DETECTED NOT DETECTED Final    Comment: (NOTE) The Coronavirus on the Respiratory Panel, DOES NOT test for the novel  Coronavirus (2019 nCoV)    Coronavirus HKU1 NOT DETECTED NOT DETECTED Final   Coronavirus NL63 NOT DETECTED NOT DETECTED Final   Coronavirus OC43 NOT DETECTED NOT DETECTED Final    Metapneumovirus NOT DETECTED NOT DETECTED Final   Rhinovirus / Enterovirus NOT DETECTED NOT DETECTED Final   Influenza A NOT DETECTED NOT DETECTED Final   Influenza B NOT DETECTED NOT DETECTED Final  Parainfluenza Virus 1 NOT DETECTED NOT DETECTED Final   Parainfluenza Virus 2 NOT DETECTED NOT DETECTED Final   Parainfluenza Virus 3 NOT DETECTED NOT DETECTED Final   Parainfluenza Virus 4 NOT DETECTED NOT DETECTED Final   Respiratory Syncytial Virus NOT DETECTED NOT DETECTED Final   Bordetella pertussis NOT DETECTED NOT DETECTED Final   Bordetella Parapertussis NOT DETECTED NOT DETECTED Final   Chlamydophila pneumoniae NOT DETECTED NOT DETECTED Final   Mycoplasma pneumoniae NOT DETECTED NOT DETECTED Final    Comment: Performed at [facility], [address] [place], [place] [address]  Resp panel by RT-PCR (RSV, Flu A&B, Covid) Anterior Nasal Swab     Status: None   Collection Time: 10/27/23 11:48 AM   Specimen: Anterior Nasal Swab  Result Value Ref Range Status   SARS Coronavirus 2 by RT PCR NEGATIVE NEGATIVE Final    Comment: (NOTE) SARS-CoV-2 target nucleic acids are NOT DETECTED.  The SARS-CoV-2 RNA is generally detectable in upper respiratory specimens during the acute phase of infection. The lowest concentration of SARS-CoV-2 viral copies this assay can detect is 138 copies/mL. A negative result does not preclude SARS-Cov-2 infection and should not be used as the sole basis for treatment or other patient management decisions. A negative result may occur with  improper specimen collection/handling, submission of specimen other than nasopharyngeal swab, presence of viral mutation(s) within the areas targeted by this assay, and inadequate number of viral copies(<138 copies/mL). A negative result must be combined with clinical observations, patient history, and epidemiological information. The expected result is Negative.  Fact Sheet for Patients:   BloggerCourse.com  Fact Sheet for Healthcare Providers:  SeriousBroker.it  This test is no t yet approved or cleared by the [place] FDA and  has been authorized for detection and/or diagnosis of SARS-CoV-2 by FDA under an Emergency Use Authorization (EUA). This EUA will remain  in effect (meaning this test can be used) for the duration of the COVID-19 declaration under Section 564(b)(1) of the Act, 21 U.S.C.section 360bbb-3(b)(1), unless the authorization is terminated  or revoked sooner.       Influenza A by PCR NEGATIVE NEGATIVE Final   Influenza B by PCR NEGATIVE NEGATIVE Final    Comment: (NOTE) The Xpert Xpress SARS-CoV-2/FLU/RSV plus assay is intended as an aid in the diagnosis of influenza from Nasopharyngeal swab specimens and should not be used as a sole basis for treatment. Nasal washings and aspirates are unacceptable for Xpert Xpress SARS-CoV-2/FLU/RSV testing.  Fact Sheet for Patients: BloggerCourse.com  Fact Sheet for Healthcare Providers: SeriousBroker.it  This test is not yet approved or cleared by the [place] FDA and has been authorized for detection and/or diagnosis of SARS-CoV-2 by FDA under an Emergency Use Authorization (EUA). This EUA will remain in effect (meaning this test can be used) for the duration of the COVID-19 declaration under Section 564(b)(1) of the Act, 21 U.S.C. section 360bbb-3(b)(1), unless the authorization is terminated or revoked.     Resp Syncytial Virus by PCR NEGATIVE NEGATIVE Final    Comment: (NOTE) Fact Sheet for Patients: BloggerCourse.com  Fact Sheet for Healthcare Providers: SeriousBroker.it  This test is not yet approved or cleared by the [place] FDA and has been authorized for detection and/or diagnosis of SARS-CoV-2 by FDA under an Emergency Use  Authorization (EUA). This EUA will remain in effect (meaning this test can be used) for the duration of the COVID-19 declaration under Section 564(b)(1) of the Act, 21 U.S.C. section 360bbb-3(b)(1), unless  the authorization is terminated or revoked.  Performed at [facility], [address], [place], [place] [address]   Blood Culture (routine x 2)     Status: None (Preliminary result)   Collection Time: 10/27/23 12:20 PM   Specimen: BLOOD RIGHT ARM  Result Value Ref Range Status   Specimen Description   Final    BLOOD RIGHT ARM Performed at [facility], [address], [place], [place] [address]    Special Requests   Final    BOTTLES DRAWN AEROBIC AND ANAEROBIC Blood Culture results may not be optimal due to an inadequate volume of blood received in culture bottles Performed at [facility], [address], [place], [place] [address]    Culture   Final    NO GROWTH 4 DAYS Performed at [facility], [address] [place], [place] [address]    Report Status PENDING  Incomplete  Blood Culture (routine x 2)     Status: None (Preliminary result)   Collection Time: 10/27/23  1:22 PM   Specimen: BLOOD LEFT ARM  Result Value Ref Range Status   Specimen Description   Final    BLOOD LEFT ARM Performed at [facility], [address], [place], [place] [address]    Special Requests   Final    BOTTLES DRAWN AEROBIC AND ANAEROBIC Blood Culture results may not be optimal due to an inadequate volume of blood received in culture bottles Performed at [facility], [address] [place], [place] [address]    Culture   Final    NO GROWTH 4 DAYS Performed at [facility], [address], [place], [place] [address]    Report Status PENDING  Incomplete    Labs: CBC: Recent Labs  Lab 10/27/23 1322 10/30/23 0446  WBC 10.1 6.8  NEUTROABS  --  4.2  HGB 11.2* 10.6*  HCT 35.5* 34.1*  MCV 83.9 83.8  PLT  211 237   Basic Metabolic Panel: Recent Labs  Lab 10/27/23 1322 10/30/23 0446  NA 138 140  K 4.0 4.2  CL 104 107  CO2 24 26  GLUCOSE 132* 98  BUN 13 20  CREATININE 0.87 0.78  CALCIUM 8.8* 8.7*  MG 2.8*  --    Liver Function Tests: Recent Labs  Lab 10/27/23 1322  AST 21  ALT 21  ALKPHOS 81  BILITOT 0.7  PROT 6.6  ALBUMIN 3.5   CBG: No results for input(s): "GLUCAP" in the last 168 hours.  Discharge time spent: greater than 30 minutes.  Signed: Tyrone Nine, MD [facility] 10/31/2023

## 2023-11-01 ENCOUNTER — Telehealth: Payer: Self-pay

## 2023-11-01 ENCOUNTER — Other Ambulatory Visit: Payer: No Typology Code available for payment source

## 2023-11-01 LAB — CULTURE, BLOOD (ROUTINE X 2)
Culture: NO GROWTH
Culture: NO GROWTH

## 2023-11-01 NOTE — Transitions of Care (Post Inpatient/ED Visit) (Signed)
11/01/2023  Name: [name] MRN: [id number] DOB: [birth year]  Today's TOC FU Call Status: Today's TOC FU Call Status:: Successful TOC FU Call Completed TOC FU Call Complete Date: 11/01/23 Patient's Name and Date of Birth confirmed.  Transition Care Management Follow-up Telephone Call Date of Discharge: 10/31/23 Discharge Facility: [facility]) Type of Discharge: Inpatient Admission Primary Inpatient Discharge Diagnosis:: CAP How have you been since you were released from the hospital?: Better (Feels so much better.) Any questions or concerns?: No  Items Reviewed: Did you receive and understand the discharge instructions provided?: Yes Medications obtained,verified, and reconciled?: Yes (Medications Reviewed) Any new allergies since your discharge?: No Dietary orders reviewed?: NA Do you have support at home?: Yes Name of Support/Comfort Primary Source: roommate  Medications Reviewed Today: Medications Reviewed Today     Reviewed by Earlie Server, RN (Registered Nurse) on 11/01/23 at [phone]  Med List Status: <None>   Medication Order Taking? Sig Documenting Provider Last Dose Status Informant  albuterol (PROVENTIL) (2.5 MG/3ML) 0.083% nebulizer solution [id number] Yes Take 3 mLs (2.5 mg total) by nebulization every 4 (four) hours as needed for wheezing or shortness of breath. Nyoka Cowden, MD Taking Active Self, Pharmacy Records  albuterol (VENTOLIN HFA) 108 (90 Base) MCG/ACT inhaler [id number] Yes Inhale 2 puffs into the lungs every 6 (six) hours as needed for wheezing or shortness of breath. Wynn Banker, MD Taking Active Self, Pharmacy Records  Budeson-Glycopyrrol-Formoterol [facility] AEROSPHERE) 160-9-4.8 MCG/ACT Sandrea Matte [id number] Yes Inhale 2 puffs into the lungs 2 (two) times daily.  Patient taking differently: Inhale 1 puff into the lungs 2 (two) times daily.   Glenford Bayley, NP Taking Active Self, Pharmacy Records  clonazePAM [facility]) 0.5 MG tablet  [id number] Yes Take 1 tablet (0.5 mg total) by mouth 3 (three) times daily as needed. for anxiety Nafziger, Kandee Keen, NP Taking Active Self, Pharmacy Records  esomeprazole (NEXIUM) 40 MG capsule [id number] Yes Take 1 capsule (40 mg total) by mouth daily.  Patient taking differently: Take 40 mg by mouth in the morning and at bedtime.   Mansouraty, Netty Starring., MD Taking Active Self, Pharmacy Records  guaiFENesin [facility]) 600 MG 12 hr tablet [id number] Yes Take 600 mg by mouth daily. [provider] Taking Active Self, Pharmacy Records  PARoxetine (PAXIL) 40 MG tablet [id number] Yes Take 1 tablet (40 mg total) by mouth daily. Nafziger, Kandee Keen, NP Taking Active Self, Pharmacy Records  propranolol (INDERAL) 20 MG tablet [id number] No Take 1 capsule (60 mg total) by mouth daily.  Patient not taking: Reported on 11/01/2023   Shirline Frees, NP Not Taking Active Self, Pharmacy Records  SUMAtriptan (IMITREX) 50 MG tablet [id number] Yes Take 50 mg by mouth. [provider] Taking Active Self, Pharmacy Records  traZODone (DESYREL) 100 MG tablet [id number] Yes Take 1 tablet (100 mg total) by mouth at bedtime as needed. for sleep. Need physical exam for further refills Nafziger, Kandee Keen, NP Taking Active Self, Pharmacy Records            Home Care and Equipment/Supplies: Were Home Health Services Ordered?: Yes Name of Home Health Agency:: [phone] Has Agency set up a time to come to your home?: No (Reports that she has decided that she does not need therapy) Any new equipment or medical supplies ordered?: No  Functional Questionnaire: Do you need assistance with bathing/showering or dressing?: No Do you need assistance with meal preparation?: No Do you need assistance with eating?: No Do you have difficulty maintaining continence:  No Do you need assistance with getting out of bed/getting out of a chair/moving?: No Do you have difficulty managing or taking your medications?: No  Follow up  appointments reviewed: PCP Follow-up appointment confirmed?: Yes Date of PCP follow-up appointment?: 11/08/23 Follow-up Provider: PCP Specialist Hospital Follow-up appointment confirmed?: Yes Date of Specialist follow-up appointment?: 11/08/23 Follow-Up Specialty Provider:: Pulmonary Do you need transportation to your follow-up appointment?: No Do you understand care options if your condition(s) worsen?: Yes-patient verbalized understanding  SDOH Interventions Today    Flowsheet Row Most Recent Value  SDOH Interventions   Food Insecurity Interventions Other (Comment)  [provided by social worker on discharge packet . reviewed withh patient]  Housing Interventions Intervention Not Indicated  Transportation Interventions Intervention Not Indicated  Utilities Interventions Intervention Not Indicated      Patient reports that she is doing well. Reviewed all discharge instructions. Confirmed follow up appointment are scheduled and patient has transportation.  Reviewed with patient that she has all her medications and is taking them as prescribed. Reviewed 30 day TOC program and patient consented and enrolled.   Goals Addressed               This Visit's Progress     TOC-  Patient will report no readmissions to the hospital in the next 30 days. (pt-stated)        Current Barriers:  Diet/Nutrition/Food Resources Patient with food insecurities. Resources provided while inpatient CAP- patient concerned about reoccurring pneumonia.  RNCM Clinical Goal(s):  Patient will work with the Care Management team over the next 30 days to address Transition of Care Barriers: Medication Management Diet/Nutrition/Food Resources Provider appointments verbalize understanding of plan for management of pneumoinia as evidenced by patient report take all medications exactly as prescribed and will call provider for medication related questions as evidenced by patient report attend all scheduled medical  appointments: PCP and specialist as evidenced by review of EMR and patient report  through collaboration with RN Care manager, provider, and care team.   Interventions: Evaluation of current treatment plan related to  self management and patient's adherence to plan as established by provider   COPD Interventions:  (Status:  New goal.) Short Term Goal Provided instruction about proper use of medications used for management of COPD including inhalers Advised patient to self assesses COPD action plan zone and make appointment with provider if in the yellow zone for 48 hours without improvement Provided education about and advised patient to utilize infection prevention strategies to reduce risk of respiratory infection Discussed the importance of adequate rest and management of fatigue with COPD Assessed social determinant of health barriers  Patient Goals/Self-Care Activities: Participate in Transition of Care Program/Attend [facility] scheduled calls Notify RN Care Manager of TOC call rescheduling needs Take all medications as prescribed Attend all scheduled provider appointments Call provider office for new concerns or questions  keep follow-up appointments: PCP and specialist use devices that will help like a cane, sock-puller or reacher Call MD for any change in condition  Follow Up Plan:  Telephone follow up appointment with care management team member scheduled for:  11/08/2023 The patient has been provided with contact information for the care management team and has been advised to call with any health related questions or concerns.          Lonia Chimera, RN, BSN, CEN [employer]- Transition of Care Team.  [facility] [phone]

## 2023-11-06 ENCOUNTER — Telehealth: Payer: Self-pay | Admitting: Adult Health

## 2023-11-06 NOTE — Telephone Encounter (Signed)
 Copied from CRM [id number]. Topic: Referral - Question >> Nov 06, 2023 11:43 AM Viola FALCON wrote: Reason for CRM: Roxie from [facility] called regarding referral for PT/OT from the [facility], says patients insurance requires authorization and wants to know if its okay that patient starts care this Friday 11/08/23? Call back number [phone]

## 2023-11-06 NOTE — Telephone Encounter (Signed)
**Note De-identified  [place] Obfuscation** Please advise 

## 2023-11-06 NOTE — Telephone Encounter (Signed)
Verbal orders given to Kristie 

## 2023-11-06 NOTE — Telephone Encounter (Signed)
 [name] noted that if insurance is not approved by Friday she will have to call again for an extension.

## 2023-11-07 ENCOUNTER — Inpatient Hospital Stay: Payer: No Typology Code available for payment source | Admitting: Nurse Practitioner

## 2023-11-08 ENCOUNTER — Other Ambulatory Visit: Payer: Self-pay

## 2023-11-08 ENCOUNTER — Ambulatory Visit: Payer: No Typology Code available for payment source | Admitting: Adult Health

## 2023-11-08 VITALS — BP 100/60 | HR 93 | Temp 98.0°F | Ht 64.0 in | Wt 158.0 lb

## 2023-11-08 DIAGNOSIS — F419 Anxiety disorder, unspecified: Secondary | ICD-10-CM

## 2023-11-08 DIAGNOSIS — J449 Chronic obstructive pulmonary disease, unspecified: Secondary | ICD-10-CM

## 2023-11-08 DIAGNOSIS — J9621 Acute and chronic respiratory failure with hypoxia: Secondary | ICD-10-CM | POA: Diagnosis not present

## 2023-11-08 DIAGNOSIS — I342 Nonrheumatic mitral (valve) stenosis: Secondary | ICD-10-CM

## 2023-11-08 DIAGNOSIS — J189 Pneumonia, unspecified organism: Secondary | ICD-10-CM | POA: Diagnosis not present

## 2023-11-08 DIAGNOSIS — G250 Essential tremor: Secondary | ICD-10-CM | POA: Diagnosis not present

## 2023-11-08 DIAGNOSIS — Z85118 Personal history of other malignant neoplasm of bronchus and lung: Secondary | ICD-10-CM | POA: Diagnosis not present

## 2023-11-08 DIAGNOSIS — F32A Depression, unspecified: Secondary | ICD-10-CM | POA: Diagnosis not present

## 2023-11-08 MED ORDER — TOPIRAMATE 25 MG PO TABS: 25.0000 mg | ORAL_TABLET | Freq: Two times a day (BID) | ORAL | 0 refills | Status: DC

## 2023-11-08 NOTE — Patient Outreach (Signed)
 Care Management  Transitions of Care Program Transitions of Care Post-discharge week 2   11/08/2023 Name: Dylin Ihnen MRN: [id number] DOB: November 15, 1955  Subjective: [name] is a 68 y.o. year old female who is a primary care patient of Nafziger, Cory, NP. The Care Management team Engaged with patient Engaged with patient by telephone to assess and address transitions of care needs.   Consent to Services:  Patient was given information about care management services, agreed to services, and gave verbal consent to participate.   Assessment: Reports that she coughing some with green sputum.  Denies fever. Reports she is having her normal amounts of shortness of breath. Has PCP follow up today.  Taking nothing for cough. No new concerns noted today.           SDOH Interventions    Flowsheet Row Telephone from 11/01/2023 in [place] [facility] DEPART[facility] ED to Hosp-Admission (Discharged) from 10/27/2023 in [place]  [place] [facility] Telephone from 10/07/2023 in [facility]UL[facility] DEPARTMENT Pulmonary Rehab Walk Test from 08/31/2022 in [facility]  SDOH Interventions      Food Insecurity Interventions Other (Comment)  [provided by social worker on discharge packet . reviewed withh patient] [employer] Provided, Inpatient TOC Intervention Not Indicated --  Housing Interventions Intervention Not Indicated -- Intervention Not Indicated --  Transportation Interventions Intervention Not Indicated -- Intervention Not Indicated --  Utilities Interventions Intervention Not Indicated -- Intervention Not Indicated --  Depression Interventions/Treatment  -- -- -- Currently on Treatment, Medication  Social Connections Interventions -- Inpatient TOC, Intervention Not Indicated -- --        Goals Addressed               This Visit's Progress     TOC-  Patient will  report no readmissions to the hospital in the next 30 days. (pt-stated)        Current Barriers:  Diet/Nutrition/Food Resources Patient with food insecurities. Resources provided while inpatient CAP- patient concerned about reoccurring pneumonia. 11/08/2023  Patient reports that she still has a productive cough.  Has PCP follow up today and she is worried about resolution of pneumonia.  Reports normal amounts of Shortness of breath.  RNCM Clinical Goal(s):  Patient will work with the Care Management team over the next 30 days to address Transition of Care Barriers: Medication Management Diet/Nutrition/Food Resources Provider appointments verbalize understanding of plan for management of pneumoinia as evidenced by patient report take all medications exactly as prescribed and will call provider for medication related questions as evidenced by patient report attend all scheduled medical appointments: PCP and specialist as evidenced by review of EMR and patient report  through collaboration with RN Care manager, provider, and care team.   Interventions: Evaluation of current treatment plan related to  self management and patient's adherence to plan as established by provider   COPD Interventions:  (Status:  Goal on track:  Yes.) Short Term Goal Provided instruction about proper use of medications used for management of COPD including inhalers Advised patient to self assesses COPD action plan zone and make appointment with provider if in the yellow zone for 48 hours without improvement Provided education about and advised patient to utilize infection prevention strategies to reduce risk of respiratory infection Discussed the importance of adequate rest and management of fatigue with COPD Encouraged patient to discuss her concerns about her cough with PCP today during office  visit. Ensured patient has transportation to follow up appointment. Reviewed importance of healthy diet Reviewed follow up  call for next week, Encouraged patient to call me if needed sooner.   Patient Goals/Self-Care Activities: Participate in Transition of Care Program/Attend [facility] scheduled calls Notify RN Care Manager of TOC call rescheduling needs Take all medications as prescribed Attend all scheduled provider appointments Call provider office for new concerns or questions  keep follow-up appointments: PCP and specialist use devices that will help like a cane, sock-puller or reacher Call MD for any change in condition   Follow Up Plan:  Telephone follow up appointment with care management team member scheduled for:  11/15/2023 The patient has been provided with contact information for the care management team and has been advised to call with any health related questions or concerns.          Plan: Telephone follow up appointment with care management team member scheduled for: 11/15/2023  Alan Ee, RN, BSN, CEN Population Health- Transition of Care Team.  [facility] [phone]

## 2023-11-08 NOTE — Patient Instructions (Signed)
 Visit Information  Thank you for taking time to visit with me today. Please don't hesitate to contact me if I can be of assistance to you before our next scheduled telephone appointment.  Following are the goals we discussed today:   Goals Addressed               This Visit's Progress     TOC-  Patient will report no readmissions to the hospital in the next 30 days. (pt-stated)        Current Barriers:  Diet/Nutrition/Food Resources Patient with food insecurities. Resources provided while inpatient CAP- patient concerned about reoccurring pneumonia. 11/08/2023  Patient reports that she still has a productive cough.  Has PCP follow up today and she is worried about resolution of pneumonia.  Reports normal amounts of Shortness of breath.  RNCM Clinical Goal(s):  Patient will work with the Care Management team over the next 30 days to address Transition of Care Barriers: Medication Management Diet/Nutrition/Food Resources Provider appointments verbalize understanding of plan for management of pneumoinia as evidenced by patient report take all medications exactly as prescribed and will call provider for medication related questions as evidenced by patient report attend all scheduled medical appointments: PCP and specialist as evidenced by review of EMR and patient report  through collaboration with RN Care manager, provider, and care team.   Interventions: Evaluation of current treatment plan related to  self management and patient's adherence to plan as established by provider   COPD Interventions:  (Status:  Goal on track:  Yes.) Short Term Goal Provided instruction about proper use of medications used for management of COPD including inhalers Advised patient to self assesses COPD action plan zone and make appointment with provider if in the yellow zone for 48 hours without improvement Provided education about and advised patient to utilize infection prevention strategies to reduce  risk of respiratory infection Discussed the importance of adequate rest and management of fatigue with COPD Encouraged patient to discuss her concerns about her cough with PCP today during office visit. Ensured patient has transportation to follow up appointment. Reviewed importance of healthy diet Reviewed follow up call for next week, Encouraged patient to call me if needed sooner.   Patient Goals/Self-Care Activities: Participate in Transition of Care Program/Attend [facility] scheduled calls Notify RN Care Manager of TOC call rescheduling needs Take all medications as prescribed Attend all scheduled provider appointments Call provider office for new concerns or questions  keep follow-up appointments: PCP and specialist use devices that will help like a cane, sock-puller or reacher Call MD for any change in condition   Follow Up Plan:  Telephone follow up appointment with care management team member scheduled for:  11/15/2023 The patient has been provided with contact information for the care management team and has been advised to call with any health related questions or concerns.          Our next appointment is by telephone on 11/15/2023   Please call the care guide team at [phone] if you need to cancel or reschedule your appointment.   If you are experiencing a Mental Health or Behavioral Health Crisis or need someone to talk to, please call the Suicide and Crisis Lifeline: 988 call the [place]  National Suicide Prevention Lifeline: 626-538-1784 or TTY: 480-310-3282 TTY (838) 643-3423) to talk to a trained counselor call 1-800-273-TALK (toll free, 24 hour hotline) call 911   Patient verbalizes understanding of instructions and care plan provided today and agrees to view in MyChart. Active  MyChart status and patient understanding of how to access instructions and care plan via MyChart confirmed with patient.     Alan Ee, RN, BSN, CEN [employer]- Transition of Care  Team.  [facility] [phone]

## 2023-11-08 NOTE — Progress Notes (Signed)
 Subjective:    Patient ID: [name], female    DOB: 08/15/1956, 68 y.o.   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  She presents to the office today for TCM visit   Admit Date 10/27/2023 Discharge Date 10/31/2023  She presented to the ED with shortness of breath and wheezing.  Does have a history of lung cancer for which she underwent right lower lobectomy and left upper lobectomy about 4-1/2 years ago.  Her chest x-ray showed right middle lobe pneumonia.  CT of the chest without any evidence of PE but did show patchy heterogeneous opacities in the middle lobe and right upper lobe concerning for pneumonia.  Started on IV fluids and started on ceftriaxone  and azithromycin  for CAP coverage.  Hospital Course  Chronic Hypoxic Respiratory Failure  -Remained on 2 L O2 throughout hospital admission.  Upon discharge she was near her baseline  Community-acquired pneumonia -She completed 5 days of IV ceftriaxone  and IV azithromycin .  Her cultures were negative  COPD -Was placed on prednisone  course which she completed during hospital admission.  H/O lung cancer status post lobectomy  -Advised follow-up with Dr. Sherrod and Dr. Darlean  Normocytic anemia -Continue to monitor  Anxiety and depression -Resumed on home medications  Elevated BNP of 104.2 -Cardiogram showing preserved LVEF with grade 1 diastolic dysfunction.  There is moderately elevated pulmonary artery systolic pressure.  The estimated right ventricular systolic pressure is 50 mmHg.  There is moderate mitral valve stenosis. -Suspected this was related to chronic hypoxic respiratory failure. -Did not think she would benefit from diuresis. Advised follow up with Cardiology    Today she reports that she is feeling better overall but is fatigued from being in the hospital twice for pneumonia in a one month period of time.  He has not had any fevers or chills.  Her breathing is back to baseline.  She does report that while in the hospital they stopped my propranolol .  Started having tremors in my hands and feet and head so when I got home I restarted propranolol  but when I was off it I did notice improvement in my breathing.  She would like to be referred to neurology for her tremors.   Review of Systems  Constitutional: Negative.   HENT: Negative.    Eyes: Negative.   Respiratory:  Positive for shortness of breath.   Cardiovascular: Negative.   Gastrointestinal: Negative.   Genitourinary: Negative.   Musculoskeletal: Negative.   Skin: Negative.   Allergic/Immunologic: Negative.   Neurological:  Positive for tremors.  Hematological: Negative.   Psychiatric/Behavioral: Negative.     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    Hepatitis C    lung ca dx'd 03/2018   Tremor, unspecified    non-specific; takes inderal .     Social History   Socioeconomic History   Marital status: Widowed    Spouse name: Not on file   Number of children: Not on  file   Years of education: Not on file   Highest education level: 12th grade  Occupational History   Not on file  Tobacco Use   Smoking status: Former    Current packs/day: 0.00    Average packs/day: 1 pack/day for 40.0 years (40.0 ttl pk-yrs)    Types: Cigarettes    Start date: 12/14/1977    Quit date: 12/14/2017    Years since quitting: 5.9    Passive exposure: Never   Smokeless tobacco: Never  Vaping Use   Vaping status: Never Used  Substance and  Sexual Activity   Alcohol use: Yes    Alcohol/week: 7.0 standard drinks of alcohol    Types: 7 Shots of liquor per week    Comment: occasional bourbon   Drug use: Not Currently   Sexual activity: Never  Other Topics Concern   Not on file  Social History Narrative   Not on file   Social Drivers of Health   Financial Resource Strain: Low Risk  (08/15/2022)   Overall Financial Resource Strain (CARDIA)    Difficulty of Paying Living Expenses: Not hard at all  Food Insecurity: Food Insecurity Present (11/01/2023)   Hunger Vital Sign    Worried About Running Out of Food in the Last Year: Sometimes true    Ran Out of Food in the Last Year: Sometimes true  Transportation Needs: No Transportation Needs (11/01/2023)   PRAPARE - Administrator, Civil Service (Medical): No    Lack of Transportation (Non-Medical): No  Physical Activity: Insufficiently Active (08/15/2022)   Exercise Vital Sign    Days of Exercise per Week: 1 day    Minutes of Exercise per Session: 10 min  Stress: No Stress Concern Present (08/15/2022)   [employer] of Occupational Health - Occupational Stress Questionnaire    Feeling of Stress : Not at all  Social Connections: Moderately Isolated (10/27/2023)   Social Connection and Isolation Panel [NHANES]    Frequency of Communication with Friends and Family: More than three times a week    Frequency of Social Gatherings with Friends and Family: Once a week    Attends Religious Services: 1 to 4 times per year    Active Member of [employer] or Organizations: No    Attends Banker Meetings: Never    Marital Status: Widowed  Intimate Partner Violence: Not At Risk (11/01/2023)   Humiliation, Afraid, Rape, and Kick questionnaire    Fear of Current or Ex-Partner: No    Emotionally Abused: No    Physically Abused: No    Sexually Abused: No    Past Surgical History:  Procedure Laterality Date   ABDOMINAL HYSTERECTOMY  1986   menorrhagia; hx of cryo  for abnormal paps   APPENDECTOMY     removed in 1986 per pt   BIOPSY  10/31/2022   Procedure: BIOPSY;  Surgeon: Wilhelmenia Aloha Raddle., MD;  Location: [facility];  Service: Gastroenterology;;   CATARACT EXTRACTION Bilateral 07/2022   ESOPHAGOGASTRODUODENOSCOPY (EGD) WITH PROPOFOL  N/A 10/31/2022   Procedure: ESOPHAGOGASTRODUODENOSCOPY (EGD) WITH PROPOFOL ;  Surgeon: Wilhelmenia Aloha Raddle., MD;  Location: [facility];  Service: Gastroenterology;  Laterality: N/A;   INTERCOSTAL NERVE BLOCK Right 12/18/2019   Procedure: Intercostal Nerve Block;  Surgeon: Kerrin Elspeth BROCKS, MD;  Location: [facility] OR;  Service: Thoracic;  Laterality: Right;   LOBECTOMY Left 03/13/2018   Procedure: Lingula section of left upper lobe lobectomy;  Surgeon: Kerrin Elspeth BROCKS, MD;  Location: [facility] OR;  Service: Thoracic;  Laterality: Left;   NODE DISSECTION  12/18/2019   Procedure: Node Dissection;  Surgeon: Kerrin Elspeth BROCKS, MD;  Location: [facility] OR;  Service: Thoracic;;   SAVORY DILATION N/A 10/31/2022   Procedure: HARLEY HODGKIN;  Surgeon: Wilhelmenia Aloha Raddle., MD;  Location: [facility];  Service: Gastroenterology;  Laterality: N/A;   VIDEO ASSISTED THORACOSCOPY (VATS)/WEDGE RESECTION Left 03/13/2018   Procedure: VIDEO ASSISTED THORACOSCOPY (VATS)/WEDGE RESECTION;  Surgeon: Kerrin Elspeth BROCKS, MD;  Location: [facility] OR;  Service: Thoracic;  Laterality: Left;    Family History  Problem Relation Age of Onset   Asthma Mother    Alzheimer's disease Mother 31   Hypertension Mother    Other Father        shot    Neuropathy Sister        face   Cancer Maternal Aunt        breast   Cancer Paternal Aunt    Lung cancer Paternal Uncle    Cancer Paternal Grandmother        abdominal   Colon cancer Neg Hx    Esophageal cancer Neg Hx    Inflammatory bowel disease Neg Hx    Liver disease Neg Hx    Pancreatic cancer Neg Hx    Rectal cancer Neg Hx    Stomach cancer Neg Hx     Allergies  Allergen Reactions    Aspirin Shortness Of Breath   Trelegy Ellipta  [Fluticasone -Umeclidin-Vilant] Shortness Of Breath    She reports that is made her cough worse.    Morphine Sulfate Other (See Comments)    RED STREAKS ON ARMS.   Oxycodone  Itching and Rash   Glycopyrrolate  Other (See Comments)    UNSPECIFIED REACTION  pt unsure if she is allergic.   Hydrocodone  Itching   Lactose Intolerance (Gi) Nausea And Vomiting    Milk only.Patient states that she can eat cheese.   Penicillins Other (See Comments)     Heartburn, upset stomach only & tolerated AMOXIL  after this reaction   Sulfa Antibiotics Tinitus    Current Outpatient Medications on File Prior to Visit  Medication Sig Dispense Refill   albuterol  (PROVENTIL ) (2.5 MG/3ML) 0.083% nebulizer solution Take 3 mLs (2.5 mg total) by nebulization every 4 (four) hours as needed for wheezing or shortness of breath. 75 mL 12   albuterol  (VENTOLIN  HFA) 108 (90 Base) MCG/ACT inhaler Inhale 2 puffs into the lungs every 6 (six) hours as needed for wheezing or shortness of breath. 8 g 0   Budeson-Glycopyrrol-Formoterol  (BREZTRI  AEROSPHERE) 160-9-4.8 MCG/ACT AERO Inhale 2 puffs into the lungs 2 (two) times daily. (Patient taking differently: Inhale 1 puff into the lungs 2 (two) times daily.) 3 each 4   clonazePAM  (KLONOPIN ) 0.5 MG tablet Take 1 tablet (0.5 mg total) by mouth 3 (three) times daily as needed. for anxiety 90 tablet 3   esomeprazole  (NEXIUM ) 40 MG capsule Take 1 capsule (40 mg total) by mouth daily. (Patient taking differently: Take 40 mg by mouth in the morning and at bedtime.) 60 capsule 11   guaiFENesin  (MUCINEX ) 600 MG 12 hr tablet Take 600 mg by mouth daily.     PARoxetine  (PAXIL ) 40 MG tablet Take 1 tablet (40 mg total) by mouth daily. 90 tablet 0   propranolol  (INDERAL ) 20 MG tablet Take 1 capsule (60 mg total) by mouth daily. 270 tablet 0   SUMAtriptan  (IMITREX ) 50 MG tablet Take 50 mg by mouth.     traZODone  (DESYREL ) 100 MG tablet Take 1 tablet (100  mg total) by mouth at bedtime as needed. for sleep. Need physical exam for further refills 30 tablet 0   No current facility-administered medications on file prior to visit.    BP 100/60   Pulse 93   Temp 98 F (36.7 C) (Oral)   Ht 5' 4 (1.626 m)   Wt 158 lb (71.7 kg)   SpO2 98% Comment: o2 setting #2  BMI 27.12 kg/m       Objective:   Physical Exam Nursing note reviewed.  Constitutional:      Appearance: Normal appearance.  Cardiovascular:     Rate and Rhythm: Normal rate and regular rhythm.     Pulses: Normal pulses.     Heart sounds: Normal heart sounds.  Pulmonary:     Effort: Pulmonary effort is normal.     Breath sounds: Normal breath sounds. No wheezing, rhonchi or rales.     Comments: On 02 at 2 L Musculoskeletal:        General: Normal range of motion.  Skin:    General: Skin is warm.  Neurological:     General: No focal deficit present.     Mental Status: She is alert and oriented to person, place, and time.     Cranial Nerves: Cranial nerves 2-12 are intact.     Sensory: Sensation is intact.     Motor: Tremor (mild BUE tremor) present.  Psychiatric:        Mood and Affect: Mood normal.        Behavior: Behavior normal.        Thought Content: Thought content normal.        Judgment: Judgment normal.       Assessment & Plan:  1. Acute on chronic hypoxic respiratory failure (HCC) (Primary) -Viewed hospital notes, discharge instructions, labs, imaging, medication changes with the patient.  All questions answered to the best of my ability. -Advise follow-up with pulmonary as directed.  Continue to wear oxygen . - CT Chest Wo Contrast; Future  2. Community acquired pneumonia of right middle lobe of lung  - CT Chest Wo Contrast; Future  3. COPD mixed type (HCC) - Continue with inhalers  - CT Chest Wo Contrast; Future  4. History of lung cancer  - CT Chest Wo Contrast; Future  5. Anxiety and depression - Continue with Paxil    6. Nonrheumatic  mitral valve stenosis  - Ambulatory referral to Cardiology  7. Essential tremor - Will have her stop Inderal  and have her trial Topamax   - topiramate  (TOPAMAX ) 25 MG tablet; Take 1 tablet (25 mg total) by mouth 2 (two) times daily.  Dispense: 60 tablet; Refill: 0 - Ambulatory referral to Neurology  Darleene Shape, NP

## 2023-11-15 ENCOUNTER — Other Ambulatory Visit: Payer: Self-pay

## 2023-11-15 ENCOUNTER — Other Ambulatory Visit: Payer: Self-pay | Admitting: Adult Health

## 2023-11-15 DIAGNOSIS — F32A Depression, unspecified: Secondary | ICD-10-CM

## 2023-11-15 NOTE — Patient Outreach (Signed)
Care Management  Transitions of Care Program Transitions of Care Post-discharge week 3   11/15/2023 Name: [name] MRN: [id number] DOB: Dec 09, 1955  Subjective: Wanell Lorenzi is a 68 y.o. year old female who is a primary care patient of Shirline Frees, NP. The Care Management team Engaged with patient Engaged with patient by telephone to assess and address transitions of care needs.   Consent to Services:  Patient was given information about care management services, agreed to services, and gave verbal consent to participate.   Assessment: Patient reports that she is doing well. Reports breathing is much better. Weaning off Inderal for tremors.   Patient is now taking topirmate for tremors. Patient reports that she does not need any additional calls. States she is self managing well.           SDOH Interventions    Flowsheet Row Telephone from 11/01/2023 in [facility] DEPART[facility] ED to Hosp-Admission (Discharged) from 10/27/2023 in [facility] Telephone from 10/07/2023 in [facility] POPULATION HEALTH DEPARTMENT Pulmonary Rehab Walk Test from 08/31/2022 in [facility]  SDOH Interventions      Food Insecurity Interventions Other (Comment)  [provided by social worker on discharge packet . reviewed withh patient] [employer] Provided, Inpatient TOC Intervention Not Indicated --  Housing Interventions Intervention Not Indicated -- Intervention Not Indicated --  Transportation Interventions Intervention Not Indicated -- Intervention Not Indicated --  Utilities Interventions Intervention Not Indicated -- Intervention Not Indicated --  Depression Interventions/Treatment  -- -- -- Currently on Treatment, Medication  Social Connections Interventions -- Inpatient TOC, Intervention Not Indicated -- --        Goals Addressed               This  Visit's Progress     COMPLETED: TOC-  Patient will report no readmissions to the hospital in the next 30 days. (pt-stated)        Current Barriers:  Diet/Nutrition/Food Resources Patient with food insecurities. Resources provided while inpatient.  11/15/2023  Patient denies this barrier at this time. CAP- patient concerned about reoccurring pneumonia.11/08/2023  Patient reports that she still has a productive cough.  Has PCP follow up today and she is worried about resolution of pneumonia.  Reports normal amounts of Shortness of breath. 11/15/2023  Patient reports that she is breathing well and taking all her medications as prescribed.   RNCM Clinical Goal(s):  Patient will work with the Care Management team over the next 30 days to address Transition of Care Barriers: Medication Management Diet/Nutrition/Food Resources Provider appointments verbalize understanding of plan for management of pneumoinia as evidenced by patient report take all medications exactly as prescribed and will call provider for medication related questions as evidenced by patient report attend all scheduled medical appointments: PCP and specialist as evidenced by review of EMR and patient report  through collaboration with RN Care manager, provider, and care team.   Interventions: Evaluation of current treatment plan related to  self management and patient's adherence to plan as established by provider   COPD Interventions:  (Status:  Goal Met.) Short Term Goal Provided instruction about proper use of medications used for management of COPD including inhalers Advised patient to self assesses COPD action plan zone and make appointment with provider if in the yellow zone for 48 hours without improvement Provided education about and advised patient to utilize infection prevention strategies to reduce risk  of respiratory infection Discussed the importance of adequate rest and management of fatigue with COPD Encouraged  patient to call her MD for any changes in condition  Patient Goals/Self-Care Activities: Take all medications as prescribed Attend all scheduled provider appointments Call provider office for new concerns or questions  keep follow-up appointments: PCP and specialist Call MD for any change in condition   Follow Up Plan:  No further follow up required: Patient to call MD for any additional needs.          Plan: Follow up with provider re: as needed Lonia Chimera, RN, BSN, CEN Population Health- Transition of Care Team.  [facility] [phone]

## 2023-11-20 ENCOUNTER — Other Ambulatory Visit: Payer: Self-pay | Admitting: Adult Health

## 2023-11-28 ENCOUNTER — Ambulatory Visit (HOSPITAL_BASED_OUTPATIENT_CLINIC_OR_DEPARTMENT_OTHER)
Admission: RE | Admit: 2023-11-28 | Discharge: 2023-11-28 | Disposition: A | Discharge status: Home / Self Care | Payer: No Typology Code available for payment source | Source: Ambulatory Visit | Attending: Adult Health | Admitting: Adult Health

## 2023-11-28 DIAGNOSIS — J449 Chronic obstructive pulmonary disease, unspecified: Secondary | ICD-10-CM | POA: Insufficient documentation

## 2023-11-28 DIAGNOSIS — J439 Emphysema, unspecified: Secondary | ICD-10-CM | POA: Diagnosis not present

## 2023-11-28 DIAGNOSIS — J9621 Acute and chronic respiratory failure with hypoxia: Secondary | ICD-10-CM | POA: Insufficient documentation

## 2023-11-28 DIAGNOSIS — J189 Pneumonia, unspecified organism: Secondary | ICD-10-CM | POA: Insufficient documentation

## 2023-11-28 DIAGNOSIS — Z85118 Personal history of other malignant neoplasm of bronchus and lung: Secondary | ICD-10-CM | POA: Diagnosis not present

## 2023-11-28 DIAGNOSIS — I700 Atherosclerosis of aorta: Secondary | ICD-10-CM | POA: Diagnosis not present

## 2023-12-04 ENCOUNTER — Encounter: Payer: Self-pay | Admitting: Adult Health

## 2023-12-11 ENCOUNTER — Inpatient Hospital Stay: Payer: No Typology Code available for payment source | Admitting: Nurse Practitioner

## 2023-12-19 ENCOUNTER — Telehealth: Payer: Self-pay | Admitting: Internal Medicine

## 2023-12-19 NOTE — Telephone Encounter (Signed)
 [employer] needs last office visit notes and prescription of oxygen to be faxed in [phone]. [employer] Script was voided by [name] and [employer] would like to know why.

## 2023-12-20 ENCOUNTER — Encounter: Payer: Self-pay | Admitting: Adult Health

## 2023-12-20 NOTE — Telephone Encounter (Signed)
 [name], can you look at this? They did not give number to call back and it's asking about cancelled ghost script which I am not familiar with. We never ordered o2 for this pt.

## 2023-12-23 NOTE — Telephone Encounter (Signed)
 Dr. Thurston Hole note from 08/28/23 states 02 None no mention of 02 usage

## 2023-12-26 NOTE — Telephone Encounter (Signed)
Thanks Leslie!

## 2023-12-26 NOTE — Telephone Encounter (Signed)
 Pt has upcoming ov with Florentina Addison 01/09/24  Will discuss o2 usage at this visit  Routing to [facility] as FYI that this needs to be addressed at [facility]  Thanks

## 2023-12-29 ENCOUNTER — Encounter: Payer: Self-pay | Admitting: Internal Medicine

## 2023-12-30 MED ORDER — ALBUTEROL SULFATE (2.5 MG/3ML) 0.083% IN NEBU: 2.5000 mg | INHALATION_SOLUTION | RESPIRATORY_TRACT | 12 refills | Status: DC | PRN

## 2023-12-31 DIAGNOSIS — J439 Emphysema, unspecified: Secondary | ICD-10-CM | POA: Insufficient documentation

## 2023-12-31 DIAGNOSIS — I050 Rheumatic mitral stenosis: Secondary | ICD-10-CM | POA: Insufficient documentation

## 2023-12-31 DIAGNOSIS — F32A Depression, unspecified: Secondary | ICD-10-CM | POA: Insufficient documentation

## 2023-12-31 DIAGNOSIS — R0600 Dyspnea, unspecified: Secondary | ICD-10-CM | POA: Insufficient documentation

## 2023-12-31 DIAGNOSIS — J40 Bronchitis, not specified as acute or chronic: Secondary | ICD-10-CM | POA: Insufficient documentation

## 2023-12-31 DIAGNOSIS — Z8489 Family history of other specified conditions: Secondary | ICD-10-CM | POA: Insufficient documentation

## 2023-12-31 DIAGNOSIS — R251 Tremor, unspecified: Secondary | ICD-10-CM | POA: Insufficient documentation

## 2023-12-31 DIAGNOSIS — B1920 Unspecified viral hepatitis C without hepatic coma: Secondary | ICD-10-CM | POA: Insufficient documentation

## 2023-12-31 DIAGNOSIS — C801 Malignant (primary) neoplasm, unspecified: Secondary | ICD-10-CM | POA: Insufficient documentation

## 2023-12-31 DIAGNOSIS — I342 Nonrheumatic mitral (valve) stenosis: Secondary | ICD-10-CM | POA: Insufficient documentation

## 2024-01-02 ENCOUNTER — Ambulatory Visit: Payer: No Typology Code available for payment source | Admitting: Cardiology

## 2024-01-09 ENCOUNTER — Encounter: Payer: Self-pay | Admitting: Nurse Practitioner

## 2024-01-09 ENCOUNTER — Ambulatory Visit (INDEPENDENT_AMBULATORY_CARE_PROVIDER_SITE_OTHER): Admitting: Radiology

## 2024-01-09 ENCOUNTER — Ambulatory Visit: Admitting: Nurse Practitioner

## 2024-01-09 ENCOUNTER — Ambulatory Visit
Admission: EM | Admit: 2024-01-09 | Discharge: 2024-01-09 | Disposition: A | Discharge status: Home / Self Care | Attending: Physician Assistant | Admitting: Physician Assistant

## 2024-01-09 ENCOUNTER — Other Ambulatory Visit: Payer: Self-pay

## 2024-01-09 VITALS — BP 144/100 | HR 92 | Ht 64.0 in | Wt 159.8 lb

## 2024-01-09 DIAGNOSIS — S6991XA Unspecified injury of right wrist, hand and finger(s), initial encounter: Secondary | ICD-10-CM | POA: Diagnosis not present

## 2024-01-09 DIAGNOSIS — J449 Chronic obstructive pulmonary disease, unspecified: Secondary | ICD-10-CM

## 2024-01-09 DIAGNOSIS — C3491 Malignant neoplasm of unspecified part of right bronchus or lung: Secondary | ICD-10-CM

## 2024-01-09 DIAGNOSIS — M79644 Pain in right finger(s): Secondary | ICD-10-CM

## 2024-01-09 DIAGNOSIS — W57XXXD Bitten or stung by nonvenomous insect and other nonvenomous arthropods, subsequent encounter: Secondary | ICD-10-CM | POA: Diagnosis not present

## 2024-01-09 DIAGNOSIS — J9611 Chronic respiratory failure with hypoxia: Secondary | ICD-10-CM | POA: Diagnosis not present

## 2024-01-09 DIAGNOSIS — C3492 Malignant neoplasm of unspecified part of left bronchus or lung: Secondary | ICD-10-CM | POA: Diagnosis not present

## 2024-01-09 MED ORDER — IBUPROFEN 800 MG PO TABS
800.0000 mg | ORAL_TABLET | Freq: Once | ORAL | Status: AC
  Administered 2024-01-09: 800 mg via ORAL

## 2024-01-09 NOTE — ED Triage Notes (Addendum)
 Pt presents with complaints of right index finger injury today after hitting it on gas cap about 30 minutes PTA. Pt currently rates her overall pain a 9/10, describes as radiating, shooting pains in finger. Denies taking medications for pain PTA.   Right index finger is swollen and red on assessment. [place] 2L at baseline.

## 2024-01-09 NOTE — Discharge Instructions (Addendum)
 You were seen today for concerns for a right index finger injury.  At this time we are still waiting on radiology to read your x-ray.  I am concerned for potential fracture based on the imaging results but we are waiting on radiology to confirm this.  We have splinted your right index finger and then buddy taped it to your middle finger to help provide stability and to prevent any displacement to the fracture should there be 1.  He should keep the splint and brace on that we have made for you until we know for sure that you do not have a fracture or until cleared by orthopedics.  If needed you can remove the splint to bathe but I recommend putting it on afterwards to help provide stabilization and prevent further injury.  You can use Tylenol and ibuprofen as needed for pain management.  I recommend a cool compress to the area for about 15 minutes with at least 30 minutes off to help prevent further swelling. If at any point you start to notice more significant swelling, bruising, difficulty moving the hand or other fingers please go to the emergency room as this could be signs of medical emergency.  [facility] [address], [place], [place] [address] [phone]  [facility] [address] [place], [place] [address]  [phone]

## 2024-01-09 NOTE — Progress Notes (Unsigned)
 @Patient  ID: [name], female    DOB: 10/19/1955, 68 y.o.   MRN: [id number]  Chief Complaint  Patient presents with   Follow-up    Pt states she was in the hospital for pneumonia. She states her breathing is better.    Referring provider: Shirline Frees, NP  HPI: 68 year old female, former smoker (40 pack years) followed for COPD Gold 3. She is a patient of Dr. Thurston Archer and was last seen in office on 08/28/2023. She is also followed by Dr. 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 Past medical history significant for migraiens, GERD, chronic hepatitis C, cirrhosis, depression, tremor, tobacco abuse.   TEST/EVENTS:  12/14/2019 PFTs: FVC 2.63 (79), FEV1 1.02 (40), ratio 39, TLC 111%, DLCOcor 44%. +BD 09/18/2021 CTA chest: No PE. Centrilobular and paraseptal emphysematous lung disease.  10/27/2021 CXR 2 view: lungs are clear. Trace right pleural effusion was stable when compared to prior exam 10/27/2021: RAST positive for dust mites and low positive for dog dander; Eos 238 01/24/2022 CXR 2 view: pulmonary hyperinflation unchanged, consistent with COPD. Unchanged costophrenic angle blunting. No acute process  07/24/2022 CT chest: interval resolution of nodule in LLL. Other small nodules, stable. Atherosclerosis. Emphysema.  07/15/2023 CT chest: no findings suspicious for metastatic disease. New patchy tree in bud opacities in RML. Stable postoperative changes. Stable 6 mm RUL nodule. Atherosclerosis. Emphysema.  11/28/2023 CT chest: emphysema. Mild bronchial thickening present but improved. Resolved RUL airspace disease.   08/28/2023: OV with [name]. Maintained on Breztri. Increased SOB, cough, wheeze. Prednisone taper and doxycycline prescribed.   01/09/2024: Today - follow up Discussed the use of AI scribe software for clinical note transcription with the patient, who gave verbal consent to proceed.  History of Present Illness   [name] is a 68 year old female with COPD and a history of lung cancer who presents for follow-up after a recent hospitalization for pneumonia and COPD flare-up.   She was hospitalized in January for pneumonia and a COPD flare-up. Since then, she has experienced significant improvement. A repeat CT scan in February showed resolution of pneumonia and stable changes related to her previous lung cancer and lobectomies. She is scheduled to see oncology in October for her five-year follow-up.  She quit smoking in 2019. Uses a Breztri inhaler. She uses a portable nebulizer only when experiencing significant coughing or chest tightness, which occurs rarely, a few times a month. Doesn't have to use her HFA.   She occasionally coughs and sometimes produces sputum, which is white. This is normal for her. No significant wheezing, chest congestion, fevers, chills, hemoptysis.  She's had multiple flare ups over the last year requiring steroids and abx.   She has not had any increased oxygen requirements.  She does have a cat at home. Her roommate does not keep flea medicine on her. She has some flea bites that she's treating at home.       Allergies  Allergen Reactions   Aspirin Shortness Of Breath   Trelegy Ellipta [Fluticasone-Umeclidin-Vilant] Shortness Of Breath    She reports that is made her cough worse.    Morphine Sulfate Other (See Comments)    RED STREAKS ON ARMS.   Oxycodone Itching and Rash   Glycopyrrolate Other (See Comments)    UNSPECIFIED REACTION  pt unsure if she is allergic.   Hydrocodone Itching   Lactose Intolerance (Gi) Nausea And Vomiting    Milk only.Patient states  that she can eat cheese.   Penicillins Other (See Comments)     Heartburn, upset stomach only & tolerated AMOXIL after this reaction   Sulfa Antibiotics Tinitus    Immunization History  Administered Date(s) Administered   Fluad Quad(high Dose 65+) 06/29/2021, 10/01/2022   Influenza Split 07/29/2012    Influenza,inj,Quad PF,6+ Mos 06/27/2019, 07/14/2020   Influenza-Unspecified 07/01/2017, 07/09/2017, 07/09/2018   Moderna Sars-Covid-2 Vaccination 01/18/2020, 02/17/2020   Pneumococcal Conjugate-13 09/04/2019   Pneumococcal Polysaccharide-23 11/13/2017   Tdap 01/21/2014    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 prior to study with DLCO  10.06 (48%) corrects to 2.32 (55%)  for alv volume and FV curve classic concave exp loop  - 06/29/2021  After extensive coaching inhaler device,  effectiveness =    90% with dpi, 75% with hfa    - 06/29/2021   Walked on RA x  3  lap(s) =  approx 750 ft @ mod fast pace, stop   Depression    Depression, major, single episode, moderate (HCC) 03/19/2008   Qualifier: Diagnosis of   By: Patricia Cunas  MD, [name]        Dyspnea    Emphysema, unspecified (HCC)    per patient mild case   Epigastric pain 10/09/2022   Esophageal dysphagia 05/11/2023   Family history of adverse reaction to anesthesia    patient states sister has a hard time waking up from anesthesia.    GERD 03/19/2008   Qualifier: Diagnosis of   By: Patricia Cunas  MD, [name]        Grief reaction 01/24/2022   Hepatitis B core antibody positive 10/09/2022   Hepatitis C     Hepatitis C, chronic (HCC) 03/14/2015   Hiatal hernia 10/09/2022   History of hepatitis C 10/09/2022   Left upper lobe pulmonary nodule 02/04/2018   IMPRESSION:  1. Negative for acute pulmonary embolus or aortic dissection  2. 14 mm left upper lobe pulmonary nodule, increased in size  compared to prior and therefore felt suspicious for carcinoma.  Consider correlation with PET-CT and/or tissue sampling.  3. Mild emphysema     Aortic Atherosclerosis (ICD10-I70.0) and Emphysema (ICD10-J43.9).        Electronically Signed    By: Jasmine Pang M.D   lung ca dx'd 03/2018   Lung mass 03/13/2018   Migraine headache 08/01/2010   Qualifier: Diagnosis of   By: Patricia Cunas  MD, [name]        NEPHROLITHIASIS, HX OF 03/19/2008   Qualifier: Diagnosis of   By: Patricia Cunas  MD, [name]        Nonrheumatic mitral (valve) stenosis    Pill dysphagia 10/09/2022   Pleural effusion  10/06/2021   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   SOB (shortness of breath) 09/18/2021   Tobacco use disorder 01/21/2014   Tremor 10/14/2012   Tremor, unspecified    non-specific; takes inderal.    Vaccine counseling 11/13/2017    Tobacco History: Social History   Tobacco Use  Smoking Status Former   Current packs/day: 0.00   Average packs/day: 1 pack/day for 40.0 years (40.0 ttl pk-yrs)   Types: Cigarettes   Start date: 12/14/1977   Quit date: 12/14/2017   Years since quitting: 6.0   Passive exposure: Never  Smokeless Tobacco Never   Counseling given: Not Answered   Outpatient Medications Prior to Visit  Medication Sig Dispense Refill   albuterol (PROVENTIL) (2.5 MG/3ML) 0.083% nebulizer solution Take 3 mLs (2.5 mg total) by nebulization every 4 (four) hours as needed for wheezing or shortness of breath. 75 mL 12   albuterol (VENTOLIN HFA) 108 (90 Base) MCG/ACT inhaler Inhale 2 puffs into the lungs every 6 (six) hours as needed for wheezing or shortness of breath. 8 g 0   Budeson-Glycopyrrol-Formoterol  (BREZTRI AEROSPHERE) 160-9-4.8 MCG/ACT AERO Inhale 2 puffs into the lungs 2 (two) times daily. (Patient taking differently: Inhale 1 puff into the lungs 2 (two) times daily.) 3 each 4   clonazePAM (KLONOPIN) 0.5 MG tablet Take 1 tablet (0.5 mg total) by mouth 3 (three) times daily as needed. for anxiety 90 tablet 3   esomeprazole (NEXIUM) 40 MG capsule Take 1 capsule (40 mg total) by mouth daily. (Patient taking differently: Take 40 mg by mouth in the morning and at bedtime.) 60 capsule 11   guaiFENesin (MUCINEX) 600 MG 12 hr tablet Take 600 mg by mouth daily.     PARoxetine (PAXIL) 40 MG tablet Take 1 tablet (40 mg total) by mouth daily. 90 tablet 0   propranolol (INDERAL) 20 MG tablet Take 1 capsule (60 mg total) by mouth daily. 270 tablet 0   traZODone (DESYREL) 100 MG tablet TAKE 1 TABLET (100 MG TOTAL) BY MOUTH AT BEDTIME AS NEEDED. FOR SLEEP. NEED PHYSICAL EXAM FOR FURTHER REFILLS 90 tablet 1   SUMAtriptan (IMITREX) 50 MG tablet TAKE 1 TAB BY MOUTH AT ONSET OF MIGRAINE. IF SYMTPOMS PERSIST, A SECOND DOSE MAY BE TAKEN IN 2 HOURS. NOT TO EXCEED 2 DOSE SIN A 24 HR PERIOD (Patient not taking: Reported on 01/09/2024) 20 tablet 3   topiramate (TOPAMAX) 25 MG tablet Take 1 tablet (25 mg total) by mouth 2 (two) times daily. (Patient not taking: Reported on 01/09/2024) 60 tablet 0   No facility-administered medications prior to visit.     Review of Systems:   Constitutional: No weight loss or gain, night sweats, fevers, chills, fatigue, or lassitude. HEENT: No headaches, difficulty swallowing, tooth/dental problems, or sore throat. No sneezing, itching, ear ache, nasal congestion, or post nasal drip CV:  No chest pain, orthopnea, PND, swelling in lower extremities, anasarca, dizziness, palpitations, syncope Resp: +shortness of breath with exertion (baseline); occasional cough. No excess mucus or change in color of mucus.  No hemoptysis. No chest wall deformity GI:  No heartburn, indigestion, abdominal  pain, nausea, vomiting, diarrhea, change in bowel habits, loss of appetite, bloody stools.  GU: No dysuria, change in color of urine, urgency or frequency.  No flank pain, no hematuria  Skin: +flea bites  MSK:  No joint pain or swelling.  No decreased range of motion.  No back pain. Neuro: No dizziness or lightheadedness.  Psych: No depression or anxiety.  Mood stable.     Physical Exam:  BP (!) 146/90 (BP Location: Right Arm, Patient Position: Sitting)   Pulse 92   Ht 5\' 4"  (1.626 m)   Wt 159 lb 12.8 oz (72.5 kg)   SpO2 98%   BMI 27.43 kg/m   GEN: Pleasant, interactive, well-nourished, chronically-ill appearing; in no acute distress. HEENT:  Normocephalic and atraumatic. PERRLA. Sclera white. Nasal turbinates pink, moist and patent bilaterally. No rhinorrhea present. Oropharynx pink and moist, without exudate or edema. No lesions, ulcerations, or postnasal drip.  NECK:  Supple w/ fair ROM. No JVD present. Normal carotid impulses w/o bruits. Thyroid symmetrical with no goiter or nodules palpated. No lymphadenopathy.   CV: RRR, no m/r/g, no peripheral edema. Pulses intact, +2 bilaterally. No cyanosis, pallor or clubbing. PULMONARY:  Unlabored, regular breathing. Diminished bilaterally A&P w/o wheezes/rhonchi/rales. No accessory muscle use. No dullness to percussion. GI: BS present and normoactive. Soft, non-tender to palpation. No organomegaly or masses detected.  MSK: No erythema, warmth or tenderness. Cap refil <2 sec all extrem. No deformities or joint swelling noted.  Neuro: A/Ox3. No focal deficits noted.   Skin: Warm, intact. Scattered insect bites, no exudate or evidence of infection. Psych: Normal affect and behavior. Judgement and thought content appropriate.     Lab Results:  CBC    Component Value Date/Time   WBC 6.8 10/30/2023 0446   RBC 4.07 10/30/2023 0446   HGB 10.6 (L) 10/30/2023 0446   HGB 13.7 07/15/2023 1309   HCT 34.1 (L) 10/30/2023 0446   PLT 237  10/30/2023 0446   PLT 326 07/15/2023 1309   MCV 83.8 10/30/2023 0446   MCH 26.0 10/30/2023 0446   MCHC 31.1 10/30/2023 0446   RDW 14.1 10/30/2023 0446   LYMPHSABS 1.8 10/30/2023 0446   MONOABS 0.7 10/30/2023 0446   EOSABS 0.0 10/30/2023 0446   BASOSABS 0.0 10/30/2023 0446    BMET    Component Value Date/Time   NA 140 10/30/2023 0446   K 4.2 10/30/2023 0446   CL 107 10/30/2023 0446   CO2 26 10/30/2023 0446   GLUCOSE 98 10/30/2023 0446   BUN 20 10/30/2023 0446   CREATININE 0.78 10/30/2023 0446   CREATININE 0.92 07/15/2023 1309   CREATININE 0.73 10/27/2021 1543   CALCIUM 8.7 (L) 10/30/2023 0446   GFRNONAA >60 10/30/2023 0446   GFRNONAA >60 07/15/2023 1309   GFRNONAA 66 07/14/2020 1059   GFRAA 76 07/14/2020 1059    BNP    Component Value Date/Time   BNP 104.2 (H) 10/27/2023 1215   BNP 78 10/27/2021 1543     Imaging:  No results found.  Administration History     None           Latest Ref Rng & Units 12/14/2019    9:20 AM 03/11/2018    7:47 AM  PFT Results  FVC-Pre L 2.01  1.86   FVC-Predicted Pre % 60  55   FVC-Post L 2.63  2.47   FVC-Predicted Post % 79  73   Pre FEV1/FVC % % 42  52   Post FEV1/FCV % % 39  52   FEV1-Pre L 0.85  0.97   FEV1-Predicted Pre % 33  37   FEV1-Post L 1.02  1.28   DLCO uncorrected ml/min/mmHg 10.06  10.99   DLCO UNC% % 48  42   DLCO corrected ml/min/mmHg 9.24  10.54   DLCO COR %Predicted % 44  41   DLVA Predicted % 55  54  TLC L 5.78  5.37   TLC % Predicted % 111  103   RV % Predicted % 168  144     Lab Results  Component Value Date   NITRICOXIDE 29 10/27/2021        Assessment & Plan:   No problem-specific Assessment & Plan notes found for this encounter.    I spent 35 minutes of dedicated to the care of this patient on the date of this encounter to include pre-visit review of records, face-to-face time with the patient discussing conditions above, post visit ordering of testing, clinical documentation  with the electronic health record, making appropriate referrals as documented, and communicating necessary findings to members of the patients care team.  Noemi Chapel, NP 01/09/2024  Pt aware and understands NP's role.

## 2024-01-09 NOTE — ED Provider Notes (Signed)
 [name]    CSN: [id number] Arrival date & time: 01/09/24  1558      History   Chief Complaint Chief Complaint  Patient presents with   Finger Injury    HPI [name] is a 68 y.o. female.   HPI  She reports right index finger pain since 4 pm She reports she was pumping gas and thinks she struck her finger on something in the gas cap area of her car She states her finger immediately hurt but she finished pumping gas and got back into her car After getting in she noticed her index finger had started to swell and turn red  She reports decreased ROM and severe pain  Interventions: She has not taken anything for pain yet    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 prior to study with DLCO  10.06 (48%) corrects to 2.32 (55%)  for alv volume and FV curve classic concave exp loop  - 06/29/2021  After extensive coaching inhaler device,  effectiveness =    90% with dpi, 75% with hfa    - 06/29/2021   Walked on RA x  3  lap(s) =  approx 750 ft @ mod fast pace, stop   Depression    Depression, major, single episode, moderate (HCC) 03/19/2008   Qualifier: Diagnosis of   By: Amador Cunas  MD, Janett Labella        Dyspnea    Emphysema,  unspecified (HCC)    per patient mild case   Epigastric pain 10/09/2022   Esophageal dysphagia 05/11/2023   Family history of adverse reaction to anesthesia    patient states sister has a hard time waking up from anesthesia.    GERD 03/19/2008   Qualifier: Diagnosis of   By: Amador Cunas  MD, Janett Labella        Grief reaction 01/24/2022   Hepatitis B core antibody positive 10/09/2022   Hepatitis C    Hepatitis C, chronic (HCC) 03/14/2015   Hiatal hernia 10/09/2022   History of hepatitis C 10/09/2022   Left upper lobe pulmonary nodule 02/04/2018   IMPRESSION:  1. Negative for acute pulmonary embolus or aortic dissection  2. 14 mm left upper lobe pulmonary nodule, increased in size  compared to prior and therefore felt suspicious for carcinoma.  Consider correlation with PET-CT and/or tissue sampling.  3. Mild emphysema     Aortic Atherosclerosis (ICD10-I70.0) and Emphysema (ICD10-J43.9).        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    Vaccine counseling 11/13/2017    Patient Active Problem List   Diagnosis Date Noted   Bronchitis    Depression    Dyspnea    Emphysema, unspecified (HCC)    Family history of adverse reaction to anesthesia    Hepatitis C    lung ca    Tremor, unspecified    Nonrheumatic mitral (valve) stenosis    Community acquired pneumonia 10/03/2023   CAP (community acquired pneumonia) 10/01/2023   Esophageal dysphagia 05/11/2023    Epigastric pain 10/09/2022   Hiatal hernia 10/09/2022   Colon cancer screening 10/09/2022   History of hepatitis C 10/09/2022   Chest pain 10/09/2022   Pill dysphagia 10/09/2022   Abnormal ultrasound of liver 10/09/2022   Hepatitis B core antibody positive 10/09/2022   Grief reaction 01/24/2022   Allergic rhinitis 10/27/2021   Chronic respiratory failure with hypoxia (HCC) 10/06/2021   Chronic rhinitis 10/06/2021   Pleural effusion 10/06/2021   Acute exacerbation of COPD with asthma (HCC) 09/18/2021   SOB (shortness of breath) 09/18/2021   Anxiety    COPD GOLD 3  06/29/2021   Adenocarcinoma of right lung, 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   Adenocarcinoma of left lung, 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   Lung mass 03/13/2018   Chronic bronchitis (HCC) 02/04/2018   Left upper lobe pulmonary nodule 02/04/2018   Vaccine counseling 11/13/2017   Cirrhosis (HCC) 11/13/2017   Arthritis of hand 04/17/2017   Hepatitis C, chronic (HCC) 03/14/2015   Tobacco use disorder 01/21/2014   Tremor 10/14/2012   Migraine headache 08/01/2010   Depression, major, single episode, moderate (HCC) 03/19/2008   GERD 03/19/2008   NEPHROLITHIASIS, HX OF 03/19/2008    Past Surgical History:  Procedure Laterality Date   ABDOMINAL HYSTERECTOMY  1986   menorrhagia; hx of cryo for abnormal paps   APPENDECTOMY     removed in 1986 per pt   BIOPSY  10/31/2022   Procedure: BIOPSY;  Surgeon: Lemar Lofty., MD;  Location: [facility];  Service: Gastroenterology;;   CATARACT EXTRACTION Bilateral 07/2022   ESOPHAGOGASTRODUODENOSCOPY (EGD) WITH PROPOFOL N/A 10/31/2022   Procedure: ESOPHAGOGASTRODUODENOSCOPY (EGD) WITH PROPOFOL;  Surgeon: Lemar Lofty., MD;  Location: [facility];  Service: Gastroenterology;  Laterality: N/A;   INTERCOSTAL NERVE BLOCK Right 12/18/2019   Procedure: Intercostal Nerve Block;  Surgeon: Loreli Slot, MD;  Location: [facility] OR;  Service:  Thoracic;  Laterality: Right;   LOBECTOMY Left 03/13/2018   Procedure: Lingula section of left upper lobe lobectomy;  Surgeon: Loreli Slot, MD;  Location: [facility] OR;  Service: Thoracic;  Laterality: Left;   NODE DISSECTION  12/18/2019   Procedure: Node Dissection;  Surgeon: Loreli Slot, MD;  Location: [facility] OR;  Service: Thoracic;;   SAVORY DILATION N/A 10/31/2022   Procedure: Jacklyn Shell;  Surgeon: Lemar Lofty., MD;  Location: [facility];  Service: Gastroenterology;  Laterality: N/A;   VIDEO ASSISTED THORACOSCOPY (VATS)/WEDGE RESECTION Left 03/13/2018   Procedure: VIDEO ASSISTED THORACOSCOPY (VATS)/WEDGE RESECTION;  Surgeon: Loreli Slot,  MD;  Location: [facility] OR;  Service: Thoracic;  Laterality: Left;    OB History   No obstetric history on file.      Home Medications    Prior to Admission medications   Medication Sig Start Date End Date Taking? Authorizing Provider  albuterol (PROVENTIL) (2.5 MG/3ML) 0.083% nebulizer solution Take 3 mLs (2.5 mg total) by nebulization every 4 (four) hours as needed for wheezing or shortness of breath. 12/30/23   Nyoka Cowden, MD  albuterol (VENTOLIN HFA) 108 (90 Base) MCG/ACT inhaler Inhale 2 puffs into the lungs every 6 (six) hours as needed for wheezing or shortness of breath. 05/03/21   Koberlein, Paris Lore, MD  Budeson-Glycopyrrol-Formoterol (BREZTRI AEROSPHERE) 160-9-4.8 MCG/ACT AERO Inhale 2 puffs into the lungs 2 (two) times daily. Patient taking differently: Inhale 1 puff into the lungs 2 (two) times daily. 04/09/23   Glenford Bayley, NP  clonazePAM (KLONOPIN) 0.5 MG tablet Take 1 tablet (0.5 mg total) by mouth 3 (three) times daily as needed. for anxiety 10/22/23   Shirline Frees, NP  esomeprazole (NEXIUM) 40 MG capsule Take 1 capsule (40 mg total) by mouth daily. Patient taking differently: Take 40 mg by mouth in the morning and at bedtime. 05/07/23   Mansouraty, Netty Starring., MD  guaiFENesin (MUCINEX) 600 MG 12  hr tablet Take 600 mg by mouth daily.    [provider]  PARoxetine (PAXIL) 40 MG tablet Take 1 tablet (40 mg total) by mouth daily. 10/22/23   Nafziger, Kandee Keen, NP  propranolol (INDERAL) 20 MG tablet Take 1 capsule (60 mg total) by mouth daily. 10/22/23   Nafziger, Kandee Keen, NP  SUMAtriptan (IMITREX) 50 MG tablet TAKE 1 TAB BY MOUTH AT ONSET OF MIGRAINE. IF SYMTPOMS PERSIST, A SECOND DOSE MAY BE TAKEN IN 2 HOURS. NOT TO EXCEED 2 DOSE SIN A 24 HR PERIOD Patient not taking: Reported on 01/09/2024 11/21/23   Shirline Frees, NP  topiramate (TOPAMAX) 25 MG tablet Take 1 tablet (25 mg total) by mouth 2 (two) times daily. Patient not taking: Reported on 01/09/2024 11/08/23   Shirline Frees, NP  traZODone (DESYREL) 100 MG tablet TAKE 1 TABLET (100 MG TOTAL) BY MOUTH AT BEDTIME AS NEEDED. FOR SLEEP. NEED PHYSICAL EXAM FOR FURTHER REFILLS 11/15/23   Shirline Frees, NP    Family History Family History  Problem Relation Age of Onset   Asthma Mother    Alzheimer's disease Mother 64   Hypertension Mother    Other Father        shot    Neuropathy Sister        face   Cancer Maternal Aunt        breast   Cancer Paternal Aunt    Lung cancer Paternal Uncle    Cancer Paternal Grandmother        abdominal   Colon cancer Neg Hx    Esophageal cancer Neg Hx    Inflammatory bowel disease Neg Hx    Liver disease Neg Hx    Pancreatic cancer Neg Hx    Rectal cancer Neg Hx    Stomach cancer Neg Hx     Social History Social History   Tobacco Use   Smoking status: Former    Current packs/day: 0.00    Average packs/day: 1 pack/day for 40.0 years (40.0 ttl pk-yrs)    Types: Cigarettes    Start date: 12/14/1977    Quit date: 12/14/2017    Years since quitting: 6.0    Passive exposure: Never  Smokeless tobacco: Never  Vaping Use   Vaping status: Never Used  Substance Use Topics   Alcohol use: Yes    Alcohol/week: 7.0 standard drinks of alcohol    Types: 7 Shots of liquor per week    Comment:  occasional bourbon   Drug use: Not Currently     Allergies   Aspirin, Trelegy ellipta [fluticasone-umeclidin-vilant], Morphine sulfate, Oxycodone, Glycopyrrolate, Hydrocodone, Lactose intolerance (gi), Penicillins, and Sulfa antibiotics   Review of Systems Review of Systems  Musculoskeletal:        Right index finger pain and swelling       Physical Exam Triage Vital Signs ED Triage Vitals  Encounter Vitals Group     BP 01/09/24 1629 (!) 147/82     Systolic BP Percentile --      Diastolic BP Percentile --      Pulse Rate 01/09/24 1629 (!) 101     Resp 01/09/24 1629 20     Temp 01/09/24 1629 (!) 97.4 F (36.3 C)     Temp Source 01/09/24 1629 Oral     SpO2 01/09/24 1629 98 %     Weight --      Height --      Head Circumference --      Peak Flow --      Pain Score 01/09/24 1631 9     Pain Loc --      Pain Education --      Exclude from Growth Chart --    No data found.  Updated Vital Signs BP (!) 147/82 (BP Location: Right Arm)   Pulse (!) 101   Temp (!) 97.4 F (36.3 C) (Oral)   Resp 20   SpO2 98%   Visual Acuity Right Eye Distance:   Left Eye Distance:   Bilateral Distance:    Right Eye Near:   Left Eye Near:    Bilateral Near:     Physical Exam Vitals reviewed.  Constitutional:      General: She is awake.     Appearance: Normal appearance. She is well-developed and well-groomed. She is ill-appearing.     Comments: Patient is a pleasant 68 year old female who appears stated age.  She does seem rather chronically ill-appearing and is currently on 2 L of oxygen via nasal cannula which she came to the urgent care facility on.  HENT:     Head: Normocephalic and atraumatic.  Pulmonary:     Effort: Pulmonary effort is normal.  Musculoskeletal:     Right hand: Tenderness present. Decreased range of motion. Normal strength. Normal capillary refill. Normal pulse.     Cervical back: Normal range of motion.     Comments: Patient has right index finger  swelling and decreased range of motion.  She is not able to flex the finger fully or touch her thumb to her index finger.  Her finger is also rather erythematous.  She has capillary refill less than 2 seconds at the distal aspect of the right index finger.  Radial pulse is 2+ and brisk at right wrist.  Skin:    General: Skin is warm and dry.  Neurological:     Mental Status: She is alert.  Psychiatric:        Attention and Perception: Attention and perception normal.        Mood and Affect: Mood and affect normal.        Speech: Speech normal.        Behavior: Behavior normal. Behavior is cooperative.  UC Treatments / Results  Labs (all labs ordered are listed, but only abnormal results are displayed) Labs Reviewed - No data to display  EKG   Radiology No results found.  Procedures Procedures (including critical care time)  Medications Ordered in UC Medications  ibuprofen (ADVIL) tablet 800 mg (800 mg Oral Given 01/09/24 1707)    Initial Impression / Assessment and Plan / UC Course  I have reviewed the triage vital signs and the nursing notes.  Pertinent labs & imaging results that were available during my care of the patient were reviewed by me and considered in my medical decision making (see chart for details).      Final Clinical Impressions(s) / UC Diagnoses   Final diagnoses:  Finger pain, right  Finger injury, right, initial encounter   Patient presents today with concerns for right index finger pain after potentially smashing her finger while pumping gas.  Physical exam is notable for erythematous and swollen right index finger.  Capillary refill is less than 2 seconds at the distal aspect of the right index finger and she appears to be neurovascularly intact.  She does have reduced range of motion due to pain and discomfort and is not able to flex or fully extend her right index finger in normal fashion.  Still awaiting radiology imaging review.  On my exam  there appears to be a small fracture along the shaft of the proximal phalanx so we will provide stabilization with a finger splint and buddy taping to the middle finger.  Recommend that she keep this on until cleared by orthopedics or we are sure that there is no fracture.  Reviewed that she should keep this on unless she is bathing or needs to wash her hands but should reapply the splint and buddy taping soon after.  Reviewed that she can use Tylenol and ibuprofen as needed for pain management.  ED return precautions reviewed and provided after visit summary.  Follow-up as needed.    Discharge Instructions      You were seen today for concerns for a right index finger injury.  At this time we are still waiting on radiology to read your x-ray.  I am concerned for potential fracture based on the imaging results but we are waiting on radiology to confirm this.  We have splinted your right index finger and then buddy taped it to your middle finger to help provide stability and to prevent any displacement to the fracture should there be 1.  He should keep the splint and brace on that we have made for you until we know for sure that you do not have a fracture or until cleared by orthopedics.  If needed you can remove the splint to bathe but I recommend putting it on afterwards to help provide stabilization and prevent further injury.  You can use Tylenol and ibuprofen as needed for pain management.  I recommend a cool compress to the area for about 15 minutes with at least 30 minutes off to help prevent further swelling. If at any point you start to notice more significant swelling, bruising, difficulty moving the hand or other fingers please go to the emergency room as this could be signs of medical emergency.  [facility] [address], [address], [place], [place] [address] [phone]  [facility] [address], [place], [place] [address]  [phone]       ED Prescriptions    None    PDMP not reviewed this encounter.  Providence Crosby, PA-C 01/09/24 1751

## 2024-01-09 NOTE — Patient Instructions (Addendum)
 Continue Albuterol inhaler 2 puffs or 3 mL neb every 6 hours as needed for shortness of breath or wheezing. Notify if symptoms persist despite rescue inhaler/neb use. Continue Breztri 2 puffs Twice daily. Brush tongue and rinse mouth afterwards Continue supplemental oxygen 2 lpm for goal >88-90%  Since you have had quite a few flare ups over the last year, we discussed the addition of biologic therapy with an injectable medication called Dupixent. This helps to decrease inflammation in the airways which in turn, helps reduce COPD exacerbations. I will discuss this with Dr. Sherene Sires and if he agrees, we will move forward with starting you on this.   Follow up in 3 months with Dr. Sherene Sires or Philis Nettle. If symptoms do not improve or worsen, please contact office for sooner follow up or seek emergency care.

## 2024-01-09 NOTE — Progress Notes (Signed)
 Radiology overread did not show signs of acute fracture.  At this time recommend continued use of the splint if preferred to provide stability.  She can use over-the-counter pain medication as desired for relief.  If symptoms are not improving or seem to be getting worse recommend follow-up in urgent care or with orthopedics per preference

## 2024-01-10 ENCOUNTER — Telehealth: Payer: Self-pay | Admitting: Nurse Practitioner

## 2024-01-10 ENCOUNTER — Encounter: Payer: Self-pay | Admitting: Nurse Practitioner

## 2024-01-10 NOTE — Progress Notes (Signed)
 Patients Dupixent enrollment form was signed by Florentina Addison and placed up front in the pharmacy box 01/10/2024

## 2024-01-13 ENCOUNTER — Telehealth: Payer: Self-pay

## 2024-01-13 NOTE — Telephone Encounter (Signed)
 Noted.

## 2024-01-13 NOTE — Telephone Encounter (Signed)
 Received New start paperwork for DUPIXENT. Will update as we work through the benefits process.  Submitted a Prior Authorization request to [facility] for DUPIXENT via CoverMyMeds. Will update once we receive a response.  Key: [id number]

## 2024-01-14 ENCOUNTER — Other Ambulatory Visit (HOSPITAL_COMMUNITY): Payer: Self-pay

## 2024-01-14 NOTE — Telephone Encounter (Signed)
 Received notification from [facility] regarding a prior authorization for DUPIXENT. Authorization has been APPROVED from 01/13/2024 to 01/12/2025. Approval letter sent to scan center.  Per test claim, copay for 28 days supply is $1255.54  Will need to submit PAP  Geraldene Kleine, PharmD, MPH, BCPS, CPP Clinical Pharmacist (Rheumatology and Pulmonology)

## 2024-01-19 ENCOUNTER — Other Ambulatory Visit: Payer: Self-pay | Admitting: Adult Health

## 2024-01-19 DIAGNOSIS — F321 Major depressive disorder, single episode, moderate: Secondary | ICD-10-CM

## 2024-01-24 ENCOUNTER — Other Ambulatory Visit (HOSPITAL_COMMUNITY): Payer: Self-pay

## 2024-01-24 NOTE — Telephone Encounter (Signed)
Submitted Patient Assistance Application to Dup[employer] for DUPIXENT along with provider portion, patient portion, PA, medication list, insurance card copy and income documents. Will update patient when we receive a response.  Phone #: [phone] Fax #: [phone]

## 2024-01-27 NOTE — Telephone Encounter (Signed)
"  Submitted Patient Assistance Application to [employer] for DUPIXENT along with provider portion, patient portion, PA, medication list, insurance card copy and income documents. Will update patient when we receive a response.  Phone #: [phone] Fax #: [phone]"  Last pharmacy team note^^  If having exacerbation, please triage pt.

## 2024-01-29 ENCOUNTER — Other Ambulatory Visit (HOSPITAL_COMMUNITY): Payer: Self-pay

## 2024-01-29 ENCOUNTER — Telehealth: Payer: Self-pay

## 2024-01-29 ENCOUNTER — Telehealth: Payer: Self-pay | Admitting: Gastroenterology

## 2024-01-29 NOTE — Telephone Encounter (Signed)
 PT has been advised that esomeprazole  will not be covered by insurance any longer and she needs an alternative sent to [facility].

## 2024-01-29 NOTE — Telephone Encounter (Signed)
 PA request has been Submitted. New Encounter has been or will be created for follow up. For additional info see Pharmacy Prior Auth telephone encounter from 01-29-2024.

## 2024-01-29 NOTE — Telephone Encounter (Signed)
 Pharmacy Patient Advocate Encounter   Received notification from Pt Calls Messages that prior authorization for Esomeprazole  Magnesium  40MG  dr capsules is required/requested.   Insurance verification completed.   The patient is insured through [employer] .   Per test claim: PA required; PA submitted to above mentioned insurance via Fax Key/confirmation #/EOC [id number] Status is pending

## 2024-01-31 ENCOUNTER — Other Ambulatory Visit: Payer: Self-pay | Admitting: Adult Health

## 2024-01-31 ENCOUNTER — Other Ambulatory Visit (HOSPITAL_COMMUNITY): Payer: Self-pay

## 2024-01-31 DIAGNOSIS — G250 Essential tremor: Secondary | ICD-10-CM

## 2024-01-31 NOTE — Telephone Encounter (Signed)
 Patient called to follow up on PA. She states that she is almost out of medication and does not want to be without the medication and have heartburn. Patient is requesting a call back to discuss and requested I send message as urgent. Please advise.

## 2024-01-31 NOTE — Telephone Encounter (Signed)
 Pharmacy Patient Advocate Encounter  Received notification from [employer] that Prior Authorization for Esomeprazole  Magnesium  40MG  dr capsules has been APPROVED from 01-30-2024 to 01-29-2025   PA #/Case ID/Reference #: [id number]

## 2024-02-03 NOTE — Telephone Encounter (Signed)
 Patient calling in regards to Pa. Please advise.

## 2024-02-03 NOTE — Telephone Encounter (Signed)
 ATC patient regarding next steps for Dupixent patient assistance program. Unable to reach. Left VM and sent MyChart message  Geraldene Kleine, PharmD, MPH, BCPS, CPP Clinical Pharmacist (Rheumatology and Pulmonology)

## 2024-02-03 NOTE — Telephone Encounter (Signed)
 PA was approved. Patient informed and will contact pharmacy.

## 2024-02-05 ENCOUNTER — Ambulatory Visit: Payer: Self-pay

## 2024-02-05 NOTE — Telephone Encounter (Signed)
 Copied from CRM [id number]. Topic: Clinical - Red Word Triage >> Feb 05, 2024 11:14 AM Magdalene School wrote: Red Word that prompted transfer to Nurse Triage: Tic bite back of leg, allergic, swollen, pus coming out, hard to walk, painful, and possibly infected.   Chief Complaint: Tick bite Symptoms: Swelling, pain, and pus to bite Frequency: Constant  Pertinent Negatives: Patient denies fever Disposition: [] ED /[] Urgent Care (no appt availability in office) / [x] Appointment(In office/virtual)/ []  [facility]/ [] Home Care/ [] Refused Recommended Disposition /[] [facility]/ []  Follow-up with PCP Additional Notes: Patient reports she had a tick on the back of her left leg on Monday. She states it was only attached for about 5 minutes. She states she is now experiencing some swelling and pus discharge from the bite area. Patient reports a history of similar reactions to tick bites. Appointment made for the patient tomorrow for evaluation. Patient instructed to call back for new or worsening symptoms. Patient verbalized understanding and agreement with this plan.     Reason for Disposition  [1] Red or very tender (to touch) area AND [2] started over 24 hours after the bite  Answer Assessment - Initial Assessment Questions 1. ATTACHED:  "Is the tick still on the skin?"  (e.g., yes, no, unsure)     No 2. ONSET - TICK STILL ATTACHED:  "How long do you think the tick has been on your skin?" (e.g., hours, days, unsure)  Note:  Is there a recent activity (camping, hiking) where the caller may have been exposed?     Monday  3. ONSET - TICK NOT STILL ATTACHED: "If the tick has been removed, how long do you think the tick was attached before you removed it?" (e.g., 5 hours, 2 days). "When was this?"     Less than 5 mintues  4. LOCATION: "Where is the tick bite located?" (e.g., arm, leg)     Back of left leg 4 inches above ankle  5. TYPE of TICK: "Is it a wood tick or a deer tick?" (e.g., deer  tick, wood tick; unsure)     Possibly deer tick  6. SIZE of TICK: "How big is the tick?" (e.g., size of poppy seed, apple seed, watermelon seed; unsure) Note: Deer ticks can be the size of a poppy seed (nymph) or an apple seed (adult).       Small 7. ENGORGED: "Did the tick look flat or engorged (full, swollen)?" (e.g., flat, engorged; unsure)     Flat  8. OTHER SYMPTOMS: "Do you have any other symptoms?" (e.g., fever, rash, redness at bite area, red ring around bite)     Swelling that is hard to touch, pus coming from bite, pain with ambulation  Protocols used: Tick Bite-A-AH

## 2024-02-05 NOTE — Telephone Encounter (Signed)
 Called pt to get her seen today with another provider but pt declined. Pt stated she only wants to see [facility]

## 2024-02-06 ENCOUNTER — Encounter (HOSPITAL_COMMUNITY): Payer: Self-pay

## 2024-02-06 ENCOUNTER — Ambulatory Visit: Admitting: Adult Health

## 2024-02-06 VITALS — BP 120/86 | HR 79 | Temp 98.1°F | Ht 64.0 in | Wt 161.0 lb

## 2024-02-06 DIAGNOSIS — S80862A Insect bite (nonvenomous), left lower leg, initial encounter: Secondary | ICD-10-CM

## 2024-02-06 DIAGNOSIS — W57XXXA Bitten or stung by nonvenomous insect and other nonvenomous arthropods, initial encounter: Secondary | ICD-10-CM

## 2024-02-06 MED ORDER — DOXYCYCLINE HYCLATE 100 MG PO CAPS
100.0000 mg | ORAL_CAPSULE | Freq: Two times a day (BID) | ORAL | 0 refills | Status: DC
Start: 2024-02-06 — End: 2024-03-02

## 2024-02-06 MED ORDER — HYDROXYZINE PAMOATE 25 MG PO CAPS: 25.0000 mg | ORAL_CAPSULE | Freq: Three times a day (TID) | ORAL | 0 refills | Status: AC | PRN

## 2024-02-06 NOTE — Progress Notes (Signed)
 Subjective:    Patient ID: [name], female    DOB: Nov 28, 1955, 68 y.o.   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), Bronchitis, CAP (community acquired pneumonia) (10/01/2023), Chest pain (10/09/2022), Chronic bronchitis (HCC) (02/04/2018), Chronic respiratory failure with hypoxia (HCC) (10/06/2021), Chronic rhinitis (10/06/2021), Cirrhosis (HCC) (11/13/2017), Colon cancer screening (10/09/2022), Community acquired pneumonia (10/03/2023), COPD GOLD 3  (06/29/2021), Depression, Depression, major, single episode, moderate (HCC) (03/19/2008), Dyspnea, Emphysema, unspecified (HCC), Epigastric pain (10/09/2022), Esophageal dysphagia (05/11/2023), Family history of adverse reaction to anesthesia, GERD (03/19/2008), Grief reaction (01/24/2022), 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), Hepatitis C, Hepatitis C, chronic (HCC) (03/14/2015), Hiatal hernia (10/09/2022), History of hepatitis C (10/09/2022), Left upper lobe pulmonary nodule (02/04/2018), lung ca (dx'd 03/2018), Lung mass (03/13/2018), Migraine headache (08/01/2010), NEPHROLITHIASIS, HX OF (03/19/2008), Nonrheumatic mitral (valve) stenosis, Pill dysphagia (10/09/2022), Pleural effusion (10/06/2021), S/P partial lobectomy of lung (12/18/2019), SOB (shortness of breath) (09/18/2021), Tobacco use disorder (01/21/2014), Tremor (10/14/2012), Tremor, unspecified, and Vaccine counseling (11/13/2017).  She presents to the office today for concern of a tick bite on her left leg. She first noticed a tick about 4 days ago. She was able to remove the tick. She is unsure of how long the tick was attached. Since the time she removed  the tick she has had localized redness, warmth, pain and yellow drainage.  Area also itches    Review of Systems See HPI   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 prior to study with DLCO  10.06 (48%) corrects to 2.32 (55%)  for alv volume and FV curve classic concave exp loop  - 06/29/2021  After extensive coaching inhaler device,  effectiveness =    90% with dpi, 75% with hfa    - 06/29/2021   Walked on RA x  3  lap(s) =  approx 750 ft @ mod fast pace, stop   Depression    Depression, major, single episode, moderate (HCC) 03/19/2008   Qualifier: Diagnosis of   By: Minnette Amato  MD, Ronie Cohen        Dyspnea    Emphysema, unspecified (HCC)    per patient mild case   Epigastric pain 10/09/2022   Esophageal dysphagia 05/11/2023   Family history of adverse reaction to anesthesia    patient states sister has a hard time waking up from anesthesia.    GERD 03/19/2008   Qualifier: Diagnosis of   By: Minnette Amato  MD,  Ronie Cohen        Grief reaction 01/24/2022   Hepatitis B core antibody positive 10/09/2022   Hepatitis C    Hepatitis C, chronic (HCC) 03/14/2015   Hiatal hernia 10/09/2022   History of hepatitis C 10/09/2022   Left upper lobe pulmonary nodule 02/04/2018    IMPRESSION:  1. Negative for acute pulmonary embolus or aortic dissection  2. 14 mm left upper lobe pulmonary nodule, increased in size  compared to prior and therefore felt suspicious for carcinoma.  Consider correlation with PET-CT and/or tissue sampling.  3. Mild emphysema     Aortic Atherosclerosis (ICD10-I70.0) and Emphysema (ICD10-J43.9).        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    Vaccine counseling 11/13/2017    Social History   Socioeconomic History   Marital status: Widowed    Spouse name: Not on file   Number of children: Not on file   Years of education: Not on file   Highest education level: 12th grade  Occupational History   Not on file  Tobacco Use   Smoking status: Former    Current packs/day: 0.00    Average packs/day: 1 pack/day for 40.0 years (40.0 ttl pk-yrs)    Types: Cigarettes    Start date: 12/14/1977    Quit date: 12/14/2017    Years since quitting: 6.1    Passive exposure: Never   Smokeless tobacco: Never  Vaping Use   Vaping status: Never Used  Substance and Sexual Activity   Alcohol use: Yes    Alcohol/week: 7.0 standard drinks of alcohol    Types: 7 Shots of liquor per week    Comment: occasional bourbon   Drug use: Not Currently   Sexual activity: Never  Other Topics Concern   Not on file  Social History Narrative   Not on file   Social Drivers of Health   Financial Resource Strain: Low Risk  (08/15/2022)    Overall Financial Resource Strain (CARDIA)    Difficulty of Paying Living Expenses: Not hard at all  Food Insecurity: Food Insecurity Present (11/01/2023)   Hunger Vital Sign    Worried About Running Out of Food in the Last Year: Sometimes true    Ran Out of Food in the Last Year: Sometimes true  Transportation Needs: No Transportation Needs (11/01/2023)   PRAPARE - Administrator, Civil Service (Medical): No    Lack of Transportation (Non-Medical): No  Physical Activity: Insufficiently Active (08/15/2022)   Exercise Vital Sign    Days of Exercise per Week: 1 day    Minutes of Exercise per Session: 10 min  Stress: No Stress Concern Present (08/15/2022)   [employer] of Occupational Health - Occupational Stress Questionnaire    Feeling of Stress : Not at all  Social Connections: Moderately Isolated (10/27/2023)  Social Advertising account executive [NHANES]    Frequency of Communication with Friends and Family: More than three times a week    Frequency of Social Gatherings with Friends and Family: Once a week    Attends Religious Services: 1 to 4 times per year    Active Member of [employer] or Organizations: No    Attends Banker Meetings: Never    Marital Status: Widowed  Intimate Partner Violence: Not At Risk (11/01/2023)   Humiliation, Afraid, Rape, and Kick questionnaire    Fear of Current or Ex-Partner: No    Emotionally Abused: No    Physically Abused: No    Sexually Abused: No    Past Surgical History:  Procedure Laterality Date   ABDOMINAL HYSTERECTOMY  1986   menorrhagia; hx of cryo for abnormal paps   APPENDECTOMY     removed in 1986 per pt   BIOPSY  10/31/2022   Procedure: BIOPSY;  Surgeon: Normie Becton., MD;  Location: [facility];  Service: Gastroenterology;;   CATARACT EXTRACTION Bilateral 07/2022   ESOPHAGOGASTRODUODENOSCOPY (EGD) WITH PROPOFOL  N/A 10/31/2022   Procedure: ESOPHAGOGASTRODUODENOSCOPY (EGD) WITH PROPOFOL ;   Surgeon: Normie Becton., MD;  Location: [facility];  Service: Gastroenterology;  Laterality: N/A;   INTERCOSTAL NERVE BLOCK Right 12/18/2019   Procedure: Intercostal Nerve Block;  Surgeon: Zelphia Higashi, MD;  Location: [facility] OR;  Service: Thoracic;  Laterality: Right;   LOBECTOMY Left 03/13/2018   Procedure: Lingula section of left upper lobe lobectomy;  Surgeon: Zelphia Higashi, MD;  Location: [facility] OR;  Service: Thoracic;  Laterality: Left;   NODE DISSECTION  12/18/2019   Procedure: Node Dissection;  Surgeon: Zelphia Higashi, MD;  Location: [facility] OR;  Service: Thoracic;;   SAVORY DILATION N/A 10/31/2022   Procedure: Thurman Flores;  Surgeon: Normie Becton., MD;  Location: [facility];  Service: Gastroenterology;  Laterality: N/A;   VIDEO ASSISTED THORACOSCOPY (VATS)/WEDGE RESECTION Left 03/13/2018   Procedure: VIDEO ASSISTED THORACOSCOPY (VATS)/WEDGE RESECTION;  Surgeon: Zelphia Higashi, MD;  Location: [facility] OR;  Service: Thoracic;  Laterality: Left;    Family History  Problem Relation Age of Onset   Asthma Mother    Alzheimer's disease Mother 23   Hypertension Mother    Other Father        shot    Neuropathy Sister        face   Cancer Maternal Aunt        breast   Cancer Paternal Aunt    Lung cancer Paternal Uncle    Cancer Paternal Grandmother        abdominal   Colon cancer Neg Hx    Esophageal cancer Neg Hx    Inflammatory bowel disease Neg Hx    Liver disease Neg Hx    Pancreatic cancer Neg Hx    Rectal cancer Neg Hx    Stomach cancer Neg Hx     Allergies  Allergen Reactions   Aspirin Shortness Of Breath   Trelegy Ellipta  [Fluticasone -Umeclidin-Vilant] Shortness Of Breath    She reports that is made her cough worse.    Morphine Sulfate Other (See Comments)    RED STREAKS ON ARMS.   Oxycodone  Itching and Rash   Glycopyrrolate  Other (See Comments)    UNSPECIFIED REACTION  pt unsure if she is allergic.   Hydrocodone  Itching    Lactose Intolerance (Gi) Nausea And Vomiting    Milk only.Patient states that she can eat cheese.   Penicillins Other (See Comments)  Heartburn, upset stomach only & tolerated AMOXIL  after this reaction   Sulfa Antibiotics Tinitus    Current Outpatient Medications on File Prior to Visit  Medication Sig Dispense Refill   albuterol  (PROVENTIL ) (2.5 MG/3ML) 0.083% nebulizer solution Take 3 mLs (2.5 mg total) by nebulization every 4 (four) hours as needed for wheezing or shortness of breath. 75 mL 12   albuterol  (VENTOLIN  HFA) 108 (90 Base) MCG/ACT inhaler Inhale 2 puffs into the lungs every 6 (six) hours as needed for wheezing or shortness of breath. 8 g 0   Budeson-Glycopyrrol-Formoterol  (BREZTRI  AEROSPHERE) 160-9-4.8 MCG/ACT AERO Inhale 2 puffs into the lungs 2 (two) times daily. (Patient taking differently: Inhale 1 puff into the lungs 2 (two) times daily.) 3 each 4   clonazePAM  (KLONOPIN ) 0.5 MG tablet Take 1 tablet (0.5 mg total) by mouth 3 (three) times daily as needed. for anxiety 90 tablet 3   esomeprazole  (NEXIUM ) 40 MG capsule Take 1 capsule (40 mg total) by mouth daily. (Patient taking differently: Take 40 mg by mouth in the morning and at bedtime.) 60 capsule 11   guaiFENesin  (MUCINEX ) 600 MG 12 hr tablet Take 600 mg by mouth daily.     PARoxetine  (PAXIL ) 40 MG tablet TAKE 1 TABLET BY MOUTH EVERY DAY 90 tablet 0   propranolol  (INDERAL ) 20 MG tablet Take 1 capsule (60 mg total) by mouth daily. 270 tablet 0   SUMAtriptan  (IMITREX ) 50 MG tablet TAKE 1 TAB BY MOUTH AT ONSET OF MIGRAINE. IF SYMTPOMS PERSIST, A SECOND DOSE MAY BE TAKEN IN 2 HOURS. NOT TO EXCEED 2 DOSE SIN A 24 HR PERIOD 20 tablet 3   topiramate  (TOPAMAX ) 25 MG tablet TAKE 1 TABLET BY MOUTH TWICE A DAY 60 tablet 0   traZODone  (DESYREL ) 100 MG tablet TAKE 1 TABLET (100 MG TOTAL) BY MOUTH AT BEDTIME AS NEEDED. FOR SLEEP. NEED PHYSICAL EXAM FOR FURTHER REFILLS 90 tablet 1   No current facility-administered medications on file  prior to visit.    BP 120/86   Pulse 79   Temp 98.1 F (36.7 C) (Oral)   Ht 5\' 4"  (1.626 m)   Wt 161 lb (73 kg)   SpO2 96% Comment: O2 level #2  BMI 27.64 kg/m       Objective:   Physical Exam Vitals and nursing note reviewed.  Constitutional:      Appearance: Normal appearance.  Cardiovascular:     Rate and Rhythm: Normal rate and regular rhythm.     Pulses: Normal pulses.     Heart sounds: Normal heart sounds.  Pulmonary:     Effort: Pulmonary effort is normal.     Breath sounds: Normal breath sounds.  Skin:    General: Skin is warm and dry.     Findings: Abscess present.          Comments: Quarter sized non fluctuant abscess noted on left lower leg. Not draining currently. Localized redness and warmth.   Neurological:     General: No focal deficit present.     Mental Status: She is alert and oriented to person, place, and time.       Assessment & Plan:  1. Tick bite of left lower leg, initial encounter (Primary)  - doxycycline  (VIBRAMYCIN ) 100 MG capsule; Take 1 capsule (100 mg total) by mouth 2 (two) times daily.  Dispense: 20 capsule; Refill: 0 - hydrOXYzine  (VISTARIL ) 25 MG capsule; Take 1 capsule (25 mg total) by mouth every 8 (eight) hours as needed.  Dispense:  30 capsule; Refill: 0 - Follow up if not improving in the next 3-4 days   Alto Atta, NP

## 2024-02-13 ENCOUNTER — Ambulatory Visit: Admitting: Family Medicine

## 2024-02-13 ENCOUNTER — Ambulatory Visit

## 2024-02-13 VITALS — BP 120/84 | HR 82 | Ht 64.0 in | Wt 160.0 lb

## 2024-02-13 DIAGNOSIS — R072 Precordial pain: Secondary | ICD-10-CM

## 2024-02-13 DIAGNOSIS — I050 Rheumatic mitral stenosis: Secondary | ICD-10-CM | POA: Diagnosis not present

## 2024-02-13 DIAGNOSIS — R002 Palpitations: Secondary | ICD-10-CM | POA: Insufficient documentation

## 2024-02-13 DIAGNOSIS — Z0000 Encounter for general adult medical examination without abnormal findings: Secondary | ICD-10-CM | POA: Diagnosis not present

## 2024-02-13 HISTORY — DX: Palpitations: R00.2

## 2024-02-13 MED ORDER — METOPROLOL TARTRATE 100 MG PO TABS: 100.0000 mg | ORAL_TABLET | Freq: Once | ORAL | 0 refills | Status: DC

## 2024-02-13 NOTE — Assessment & Plan Note (Signed)
 Chest pain with exertion, associated with dyspnea on exertion likely combination of pulmonary, deconditioning.  Cannot entirely exclude cardiac causes. Does have cardiovascular risk factors.  Will proceed with cardiac CT coronary angiogram to assess for any significant obstructive coronary artery disease.

## 2024-02-13 NOTE — Assessment & Plan Note (Signed)
 Likely related to tachycardia in the setting of chronic dyspnea and reduced pulmonary capacity.  But also has moderate mitral stenosis and high risk for atrial arrhythmias.  Will proceed with 14-day Zio patch monitor to assess for any other significant arrhythmias other than sinus tachycardia.

## 2024-02-13 NOTE — Progress Notes (Signed)
 Cardiology Consultation:    Date:  02/13/2024   ID:  Tawni Fat, DOB [birth year], MRN [id number]  PCP:  Alto Atta, NP  Cardiologist:  Angelena Kells, MD   Referring MD: Alto Atta, NP   No chief complaint on file.    ASSESSMENT AND PLAN:   [name] 68 year old woman with history of Moderate mitral stenosis on echocardiogram January 2025 [TEE was recommended], moderately elevated RVSP 50 mmHg by echocardiogram January 2025, history of lung cancer s/p right lower lobe and left upper lobectomy in 2019, quit smoking in 2020 [45 years smoking history], COPD on 2 L of nasal cannula oxygen  flow, hiatal hernia, essential tremors [maintained on propranolol ], anxiety, depression.  Problem List Items Addressed This Visit     Chest pain   Chest pain with exertion, associated with dyspnea on exertion likely combination of pulmonary, deconditioning.  Cannot entirely exclude cardiac causes. Does have cardiovascular risk factors.  Will proceed with cardiac CT coronary angiogram to assess for any significant obstructive coronary artery disease.       Relevant Orders   EKG 12-Lead (Completed)   CT CORONARY MORPH W/CTA COR W/SCORE W/CA W/CM &/OR WO/CM   Basic Metabolic Panel (BMET)   ECHOCARDIOGRAM COMPLETE   LONG TERM MONITOR (3-14 DAYS)   Mitral stenosis - Primary   Moderate mitral stenosis with mean gradient 8 mmHg at heart rate 78/min on transthoracic echocardiogram from January 2025.  Normal biventricular function with LVEF 60 to 65%.  Mildly dilated left atrium. Estimated RVSP 50 mmHg. Elevated right ventricular pressures could be a combination of mitral stenosis but she more prominently has COPD and lung cancer history s/p lobectomy.  At this time I would reassess her mitral valve stenosis and biventricular function with repeat echocardiogram at 58-month interval and follow-up with her in the office tentatively in 2 months.  Will also review other assessment  for cardiac causes of her symptoms as reviewed below under chest pain and palpitations      Relevant Medications   metoprolol tartrate (LOPRESSOR) 100 MG tablet   Other Relevant Orders   EKG 12-Lead (Completed)   Palpitations   Likely related to tachycardia in the setting of chronic dyspnea and reduced pulmonary capacity.  But also has moderate mitral stenosis and high risk for atrial arrhythmias.  Will proceed with 14-day Zio patch monitor to assess for any other significant arrhythmias other than sinus tachycardia.       Return to clinic for follow-up in 2 months.    History of Present Illness:    [name] is a 68 y.o. female who is being seen today for the evaluation of moderate mitral stenosis at the request of Alto Atta, NP. Follow up with Dr. Waymond Hailey, pulmonologist.  Pleasant woman here for the visit by herself.  Lives with her roommate and her dog at home.  Her husband passed away 2 years ago.  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   Moderate mitral stenosis on echocardiogram January 2025 [TEE was recommended], moderately elevated RVSP 50 mmHg by echocardiogram January 2025, history of lung cancer s/p right lower lobe and left upper lobectomy in 2019, quit smoking in 2020 [45 years smoking history], COPD on 2 L of nasal cannula oxygen  flow, hiatal hernia, essential tremors [maintained on propranolol ], anxiety, depression  Recently in January during inpatient stay for pneumonia echocardiogram identified moderate mitral stenosis.  Subsequently she has been scheduled for follow-up visit.  Reports symptoms of shortness of breath  which is at baseline, continues with home nasal O2 flow at 2 L/min.  Short exertion causes symptoms of shortness of breath. Also reports association with chest pressure and heaviness.  Atypical sensation lower part of the chest extending into the retrosternal area.  Mentions this has been gradually progressing. Reports sensation of  fast heart rates, with minimal exertion.   EKG in the clinic today shows sinus rhythm heart rate 82/min, PR interval 164 ms, QRS duration 80 ms, QTc 446 ms.  No significant ST-T changes to suggest ischemia.  Echocardiogram from January 2025 noted LVEF 60 to 65%, grade 1 diastolic dysfunction, normal RV size and function with elevated RVSP 50 mmHg.  Mildly dilated left atrium with possibly rheumatic appearing mitral valve with trace mitral regurgitation and moderate mitral stenosis.  With mean gradient 8 mmHg and valve area calculated 1.16 cm and TEE was recommended.  Reviewed images myself mean gradient 8 mmHg at heart rate 78/min   No recent blood work available since October 30, 2023.  BUN at the time 20, creatinine 0.78, EGFR greater than 60. CBC with hemoglobin 10.6, hematocrit 34.1, WBC 6.8 and platelets 237.  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 prior to study with DLCO  10.06 (48%) corrects to 2.32 (55%)  for alv volume and FV curve classic concave exp loop  - 06/29/2021  After extensive coaching inhaler device,  effectiveness =    90% with dpi, 75% with hfa    - 06/29/2021   Walked on RA x  3  lap(s) =  approx 750 ft @ mod fast pace, stop   Depression     Depression, major, single episode, moderate (HCC) 03/19/2008   Qualifier: Diagnosis of   By: Minnette Amato  MD, Ronie Cohen        Dyspnea    Emphysema, unspecified (HCC)    per patient mild case   Epigastric pain 10/09/2022   Esophageal dysphagia 05/11/2023   Family history of adverse reaction to anesthesia    patient states sister has a hard time waking up from anesthesia.    GERD 03/19/2008   Qualifier: Diagnosis of   By: Minnette Amato  MD, Ronie Cohen        Grief reaction 01/24/2022   Hepatitis B core antibody positive 10/09/2022   Hepatitis C    Hepatitis C, chronic (HCC) 03/14/2015   Hiatal hernia 10/09/2022   History of hepatitis C 10/09/2022   Left upper lobe pulmonary nodule 02/04/2018   IMPRESSION:  1. Negative for acute pulmonary embolus or aortic dissection  2. 14 mm left upper lobe pulmonary nodule, increased in size  compared to prior and therefore felt suspicious for carcinoma.  Consider correlation with PET-CT  and/or tissue sampling.  3. Mild emphysema     Aortic Atherosclerosis (ICD10-I70.0) and Emphysema (ICD10-J43.9).        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    Vaccine counseling 11/13/2017    Past Surgical History:  Procedure Laterality Date   ABDOMINAL HYSTERECTOMY  1986   menorrhagia; hx of cryo for abnormal paps   APPENDECTOMY     removed in 1986 per pt   BIOPSY  10/31/2022   Procedure: BIOPSY;  Surgeon: Normie Becton., MD;   Location: [facility];  Service: Gastroenterology;;   CATARACT EXTRACTION Bilateral 07/2022   ESOPHAGOGASTRODUODENOSCOPY (EGD) WITH PROPOFOL  N/A 10/31/2022   Procedure: ESOPHAGOGASTRODUODENOSCOPY (EGD) WITH PROPOFOL ;  Surgeon: Normie Becton., MD;  Location: [facility];  Service: Gastroenterology;  Laterality: N/A;   INTERCOSTAL NERVE BLOCK Right 12/18/2019   Procedure: Intercostal Nerve Block;  Surgeon: Zelphia Higashi, MD;  Location: [facility] OR;  Service: Thoracic;  Laterality: Right;   LOBECTOMY Left 03/13/2018   Procedure: Lingula section of left upper lobe lobectomy;  Surgeon: Zelphia Higashi, MD;  Location: [facility] OR;  Service: Thoracic;  Laterality: Left;   NODE DISSECTION  12/18/2019   Procedure: Node Dissection;  Surgeon: Zelphia Higashi, MD;  [facility];  Service: Thoracic;;   SAVORY DILATION N/A 10/31/2022   Procedure: Thurman Flores;  Surgeon: Normie Becton., MD;  Location: [facility];  Service: Gastroenterology;  Laterality: N/A;   VIDEO ASSISTED THORACOSCOPY (VATS)/WEDGE RESECTION Left 03/13/2018   Procedure: VIDEO ASSISTED THORACOSCOPY (VATS)/WEDGE RESECTION;  Surgeon: Zelphia Higashi, MD;  Location: [facility] OR;  Service: Thoracic;  Laterality: Left;    Current Medications: Current Meds  Medication Sig   albuterol  (PROVENTIL ) (2.5 MG/3ML) 0.083% nebulizer solution Take 3 mLs (2.5 mg total) by nebulization every 4 (four) hours as needed for wheezing or shortness of breath.   albuterol  (VENTOLIN  HFA) 108 (90 Base) MCG/ACT inhaler Inhale 2 puffs into the lungs every 6 (six) hours as needed for wheezing or shortness of breath.   Budeson-Glycopyrrol-Formoterol  (BREZTRI  AEROSPHERE) 160-9-4.8 MCG/ACT AERO Inhale 2 puffs into the lungs 2 (two) times daily. (Patient taking differently: Inhale 1 puff into the lungs 2 (two) times daily.)   clonazePAM  (KLONOPIN ) 0.5 MG tablet Take 1 tablet (0.5 mg total) by mouth 3 (three) times daily as needed. for  anxiety   doxycycline  (VIBRAMYCIN ) 100 MG capsule Take 1 capsule (100 mg total) by mouth 2 (two) times daily.   esomeprazole  (NEXIUM ) 40 MG capsule Take 1 capsule (40 mg total) by mouth daily. (Patient taking differently: Take 40 mg by mouth in the morning and at bedtime.)   guaiFENesin  (MUCINEX ) 600 MG 12 hr tablet Take 600 mg by mouth daily.   hydrOXYzine  (VISTARIL ) 25 MG capsule Take 1 capsule (25 mg total) by mouth every 8 (eight) hours as needed.  metoprolol tartrate (LOPRESSOR) 100 MG tablet Take 1 tablet (100 mg total) by mouth once for 1 dose. Please take this medication 2 hours before CT   PARoxetine  (PAXIL ) 40 MG tablet TAKE 1 TABLET BY MOUTH EVERY DAY   propranolol  (INDERAL ) 20 MG tablet Take 1 capsule (60 mg total) by mouth daily.   SUMAtriptan  (IMITREX ) 50 MG tablet TAKE 1 TAB BY MOUTH AT ONSET OF MIGRAINE. IF SYMTPOMS PERSIST, A SECOND DOSE MAY BE TAKEN IN 2 HOURS. NOT TO EXCEED 2 DOSE SIN A 24 HR PERIOD   topiramate  (TOPAMAX ) 25 MG tablet TAKE 1 TABLET BY MOUTH TWICE A DAY   traZODone  (DESYREL ) 100 MG tablet TAKE 1 TABLET (100 MG TOTAL) BY MOUTH AT BEDTIME AS NEEDED. FOR SLEEP. NEED PHYSICAL EXAM FOR FURTHER REFILLS     Allergies:   Aspirin, Trelegy ellipta  [fluticasone -umeclidin-vilant], Morphine sulfate, Oxycodone , Glycopyrrolate , Hydrocodone , Lactose intolerance (gi), Penicillins, and Sulfa antibiotics   Social History   Socioeconomic History   Marital status: Widowed    Spouse name: Not on file   Number of children: Not on file   Years of education: Not on file   Highest education level: 12th grade  Occupational History   Not on file  Tobacco Use   Smoking status: Former    Current packs/day: 0.00    Average packs/day: 1 pack/day for 40.0 years (40.0 ttl pk-yrs)    Types: Cigarettes    Start date: 12/14/1977    Quit date: 12/14/2017    Years since quitting: 6.1    Passive exposure: Never   Smokeless tobacco: Never  Vaping Use   Vaping status: Never Used   Substance and Sexual Activity   Alcohol use: Yes    Alcohol/week: 7.0 standard drinks of alcohol    Types: 7 Shots of liquor per week    Comment: occasional bourbon   Drug use: Not Currently   Sexual activity: Never  Other Topics Concern   Not on file  Social History Narrative   Not on file   Social Drivers of Health   Financial Resource Strain: Low Risk  (08/15/2022)   Overall Financial Resource Strain (CARDIA)    Difficulty of Paying Living Expenses: Not hard at all  Food Insecurity: Food Insecurity Present (11/01/2023)   Hunger Vital Sign    Worried About Running Out of Food in the Last Year: Sometimes true    Ran Out of Food in the Last Year: Sometimes true  Transportation Needs: No Transportation Needs (11/01/2023)   PRAPARE - Administrator, Civil Service (Medical): No    Lack of Transportation (Non-Medical): No  Physical Activity: Insufficiently Active (08/15/2022)   Exercise Vital Sign    Days of Exercise per Week: 1 day    Minutes of Exercise per Session: 10 min  Stress: No Stress Concern Present (08/15/2022)   [employer] of Occupational Health - Occupational Stress Questionnaire    Feeling of Stress : Not at all  Social Connections: Moderately Isolated (10/27/2023)   Social Connection and Isolation Panel [NHANES]    Frequency of Communication with Friends and Family: More than three times a week    Frequency of Social Gatherings with Friends and Family: Once a week    Attends Religious Services: 1 to 4 times per year    Active Member of [employer] or Organizations: No    Attends Banker Meetings: Never    Marital Status: Widowed     Family History: The patient's family history includes  Alzheimer's disease (age of onset: 76) in her mother; Asthma in her mother; Cancer in her maternal aunt, paternal aunt, and paternal grandmother; Hypertension in her mother; Lung cancer in her paternal uncle; Neuropathy in her sister; Other in her father.  There is no history of Colon cancer, Esophageal cancer, Inflammatory bowel disease, Liver disease, Pancreatic cancer, Rectal cancer, or Stomach cancer. ROS:   Please see the history of present illness.    All 14 point review of systems negative except as described per history of present illness.  EKGs/Labs/Other Studies Reviewed:    The following studies were reviewed today:   EKG:  EKG Interpretation Date/Time:  Thursday Feb 13 2024 14:58:36 EDT Ventricular Rate:  82 PR Interval:  164 QRS Duration:  80 QT Interval:  382 QTC Calculation: 446 R Axis:   37  Text Interpretation: Normal sinus rhythm Normal ECG When compared with ECG of 27-Oct-2023 11:33, PREVIOUS ECG IS PRESENT Confirmed by Bertha Broad reddy [id number]) on 02/13/2024 3:37:18 PM    Recent Labs: 10/27/2023: ALT 21; B Natriuretic Peptide 104.2; Magnesium  2.8 10/30/2023: BUN 20; Creatinine, Ser 0.78; Hemoglobin 10.6; Platelets 237; Potassium 4.2; Sodium 140  Recent Lipid Panel    Component Value Date/Time   CHOL 242 (H) 09/04/2019 1545   TRIG 260 (H) 09/04/2019 1545   HDL 50 09/04/2019 1545   CHOLHDL 4.8 09/04/2019 1545   VLDL 68.4 (H) 08/11/2018 0900   LDLCALC 149 (H) 09/04/2019 1545   LDLDIRECT 173.0 08/11/2018 0900    Physical Exam:    VS:  BP 120/84   Pulse 82   Ht 5\' 4"  (1.626 m)   Wt 160 lb (72.6 kg)   SpO2 98%   BMI 27.46 kg/m     Wt Readings from Last 3 Encounters:  02/13/24 160 lb (72.6 kg)  02/06/24 161 lb (73 kg)  01/09/24 159 lb 12.8 oz (72.5 kg)     GENERAL:  Well nourished, tachypneic and on 2 L nasal flow oxygen . NECK: No JVD; No carotid bruits CARDIAC: RRR, S1 and S2 present, no murmurs, no rubs, no gallops CHEST:  Clear to auscultation without rales, wheezing or rhonchi  Extremities: No pitting pedal edema. Pulses bilaterally symmetric with radial 2+ and dorsalis pedis 2+ NEUROLOGIC:  Alert and oriented x 3  Medication Adjustments/Labs and Tests Ordered: Current medicines are  reviewed at length with the patient today.  Concerns regarding medicines are outlined above.  Orders Placed This Encounter  Procedures   CT CORONARY MORPH W/CTA COR W/SCORE W/CA W/CM &/OR WO/CM   Basic Metabolic Panel (BMET)   LONG TERM MONITOR (3-14 DAYS)   EKG 12-Lead   ECHOCARDIOGRAM COMPLETE   Meds ordered this encounter  Medications   metoprolol tartrate (LOPRESSOR) 100 MG tablet    Sig: Take 1 tablet (100 mg total) by mouth once for 1 dose. Please take this medication 2 hours before CT    Dispense:  1 tablet    Refill:  0    Signed, Alondra Vandeven reddy Siani Utke, MD, MPH, [facility]. 02/13/2024 4:09 PM    [facility]

## 2024-02-13 NOTE — Telephone Encounter (Signed)
 Email to be sent to Alba Huddle for case update

## 2024-02-13 NOTE — Assessment & Plan Note (Signed)
 Moderate mitral stenosis with mean gradient 8 mmHg at heart rate 78/min on transthoracic echocardiogram from January 2025.  Normal biventricular function with LVEF 60 to 65%.  Mildly dilated left atrium. Estimated RVSP 50 mmHg. Elevated right ventricular pressures could be a combination of mitral stenosis but she more prominently has COPD and lung cancer history s/p lobectomy.  At this time I would reassess her mitral valve stenosis and biventricular function with repeat echocardiogram at 97-month interval and follow-up with her in the office tentatively in 2 months.  Will also review other assessment for cardiac causes of her symptoms as reviewed below under chest pain and palpitations

## 2024-02-13 NOTE — Patient Instructions (Signed)
 I really enjoyed getting to talk with you today! I am available on Tuesdays and Thursdays for virtual visits if you have any questions or concerns, or if I can be of any further assistance.   CHECKLIST FROM ANNUAL WELLNESS VISIT:  -Follow up (please call to schedule if not scheduled after visit):   -yearly for annual wellness visit with primary care office  Here is a list of your preventive care/health maintenance measures and the plan for each if any are due:  PLAN For any measures below that may be due:    1. Please complete the cologuard test and return ASAP   2. Please call the gynecology office and schedule your mammogram and bone density test asap   3. Please provide record for your Tdap vaccine. Thanks!  Health Maintenance  Topic Date Due   Fecal DNA (Cologuard)  Never done   MAMMOGRAM  Never done   DEXA SCAN  Never done   DTaP/Tdap/Td (2 - Td or Tdap) 01/22/2024   INFLUENZA VACCINE  05/01/2024   Pneumonia Vaccine 45+ Years old (3 of 3 - PCV20 or PCV21) 09/03/2024   Medicare Annual Wellness (AWV)  02/12/2025   Hepatitis C Screening  Completed   Zoster Vaccines- Shingrix  Completed   HPV VACCINES  Aged Out   Meningococcal B Vaccine  Aged Out   Colonoscopy  Discontinued   COVID-19 Vaccine  Discontinued    -See a dentist at least yearly  -Get your eyes checked and then per your eye specialist's recommendations  -Other issues addressed today:   -I have included below further information regarding a healthy whole foods based diet, physical activity guidelines for adults, stress management and opportunities for social connections. I hope you find this information useful.   -----------------------------------------------------------------------------------------------------------------------------------------------------------------------------------------------------------------------------------------------------------    NUTRITION: -eat real food: lots of colorful  vegetables (half the plate) and fruits -5-7 servings of vegetables and fruits per day (fresh or steamed is best), exp. 2 servings of vegetables with lunch and dinner and 2 servings of fruit per day. Berries and greens such as kale and collards are great choices.  -consume on a regular basis:  fresh fruits, fresh veggies, fish, nuts, seeds, healthy oils (such as olive oil, avocado oil), whole grains (make sure for bread/pasta/crackers/etc., that the first ingredient on label contains the word "whole"), legumes. -can eat small amounts of dairy and lean meat (no larger than the palm of your hand), but avoid processed meats such as ham, bacon, lunch meat, etc. -drink water -try to avoid fast food and pre-packaged foods, processed meat, ultra processed foods/beverages (donuts, candy, etc.) -most experts advise limiting sodium to < 2300mg  per day, should limit further is any chronic conditions such as high blood pressure, heart disease, diabetes, etc. The American Heart Association advised that < 1500mg  is is ideal -try to avoid foods/beverages that contain any ingredients with names you do not recognize  -try to avoid foods/beverages  with added sugar or sweeteners/sweets  -try to avoid sweet drinks (including diet drinks): soda, juice, Gatorade, sweet tea, power drinks, diet drinks -try to avoid white rice, white bread, pasta (unless whole grain)  EXERCISE GUIDELINES FOR ADULTS: -if you wish to increase your physical activity, do so gradually and with the approval of your doctor -STOP and seek medical care immediately if you have any chest pain, chest discomfort or trouble breathing when starting or increasing exercise  -move and stretch your body, legs, feet and arms when sitting for long periods -Physical activity guidelines  for optimal health in adults: -get at least 150 minutes per week of moderate exercise (can talk, but not sing); this is about 20-30 minutes of sustained activity 5-7 days per week  or two 10-15 minute episodes of sustained activity 5-7 days per week -do some muscle building/resistance training/strength training at least 2 days per week  -balance exercises 3+ days per week:   Stand somewhere where you have something sturdy to hold onto if you lose balance    1) lift up on toes, then back down, start with 5x per day and work up to 20x   2) stand and lift one leg straight out to the side so that foot is a few inches of the floor, start with 5x each side and work up to 20x each side   3) stand on one foot, start with 5 seconds each side and work up to 20 seconds on each side  If you need ideas or help with getting more active:  -Silver sneakers https://tools.silversneakers.com  -Walk with a Doc: http://www.duncan-williams.com/  -try to include resistance (weight lifting/strength building) and balance exercises twice per week: or the following link for ideas: http://castillo-powell.com/  BuyDucts.dk  STRESS MANAGEMENT: -can try meditating, or just sitting quietly with deep breathing while intentionally relaxing all parts of your body for 5 minutes daily -if you need further help with stress, anxiety or depression please follow up with your primary doctor or contact the wonderful folks at [facility]: [phone]  SOCIAL CONNECTIONS: -options in [place] if you wish to engage in more social and exercise related activities:  -Silver sneakers https://tools.silversneakers.com  -Walk with a Doc: http://www.duncan-williams.com/  -Check out the [employer] [facility] Active Adults 50+ section on the [place] [employer] (hiking clubs, book clubs, cards and games, chess, exercise classes, aquatic classes and much more) - see the website for details: https://www.[place].gov/departments/parks-recreation/active-adults50  -YouTube has lots of exercise videos for different ages and  abilities as well  -[facility] (a variety of indoor and outdoor inperson activities for adults). (501)703-2947. [address]  -Virtual Online Classes (a variety of topics): see seniorplanet.org or call (847) 555-7722  -consider volunteering at a school, hospice center, church, senior center or elsewhere

## 2024-02-13 NOTE — Patient Instructions (Signed)
 Medication Instructions:  Your physician recommends that you continue on your current medications as directed. Please refer to the Current Medication list given to you today.  *If you need a refill on your cardiac medications before your next appointment, please call your pharmacy*  Lab Work: Your physician recommends that you return for lab work in:   Labs today: BMP  If you have labs (blood work) drawn today and your tests are completely normal, you will receive your results only by: MyChart Message (if you have MyChart) OR A paper copy in the mail If you have any lab test that is abnormal or we need to change your treatment, we will call you to review the results.  Testing/Procedures: Your physician has requested that you have an echocardiogram. Echocardiography is a painless test that uses sound waves to create images of your heart. It provides your doctor with information about the size and shape of your heart and how well your heart's chambers and valves are working. This procedure takes approximately one hour. There are no restrictions for this procedure. Please do NOT wear cologne, perfume, aftershave, or lotions (deodorant is allowed). Please arrive 15 minutes prior to your appointment time.  Please note: We ask at that you not bring children with you during ultrasound (echo/ vascular) testing. Due to room size and safety concerns, children are not allowed in the ultrasound rooms during exams. Our front office staff cannot provide observation of children in our lobby area while testing is being conducted. An adult accompanying a patient to their appointment will only be allowed in the ultrasound room at the discretion of the ultrasound technician under special circumstances. We apologize for any inconvenience.  A zio monitor was ordered today. It will remain on for 14 days. You will then return monitor and event diary in provided box. It takes 1-2 weeks for report to be downloaded and  returned to us . We will call you with the results. If monitor falls off or has orange flashing light, please call Zio for further instructions.     Your cardiac CT will be scheduled at one of the below locations:   [facility] [address] [place], [place] [address] [phone]  OR  [facility] [address] [place], [place] [address] [phone]  OR   [facility] [address] [place], [place] [address] [phone]  OR   [facility] [address] [place], [place] [address] [phone]  OR   [facility] [address]  [place], [place] [address] Opening January 27, 2024  If scheduled at [facility], please arrive at the [facility] and Children's Entrance (Entrance C2) of [facility] 30 minutes prior to test start time. You can use the FREE valet parking offered at entrance C (encouraged to control the heart rate for the test)  Proceed to the [facility] Radiology Department (first floor) to check-in and test prep.   All radiology patients and guests should use entrance C2 at [facility], accessed from [facility], even though the hospital's physical address listed is [address]    If scheduled at the Heart and Vascular Tower at [address], please enter the parking lot using the [address] entrance and use the FREE valet service at the patient drop-off area. Enter the buidling and check-in with registration on the main floor.  If scheduled at [facility] or [facility], please arrive 15 mins early for check-in and test prep.  There is spacious parking and easy access to the radiology department from the [facility] Heart and Vascular entrance. Please enter here and check-in with the desk attendant.   If scheduled at [facility],  please arrive 30 minutes early for check-in and test prep.  Please follow these instructions carefully (unless otherwise directed):  An IV will be required for this test and Nitroglycerin  will be given.  Hold all erectile dysfunction medications at least 3 days (72 hrs) prior to test. (Ie viagra, cialis, sildenafil, tadalafil, etc)   On the Night Before the Test: Be sure to Drink plenty of water. Do not consume any caffeinated/decaffeinated beverages or chocolate 12 hours prior to your test. Do not take any antihistamines 12 hours prior to your test.  On the Day of the Test: Drink plenty of water until 1 hour prior to the test. Do not eat any food 1 hour prior to test. You may take your regular medications prior to the test.  Take metoprolol (Lopressor) two hours prior to test. Patients who wear a continuous glucose monitor MUST remove the device prior to scanning. FEMALES- please wear underwire-free bra if available, avoid dresses & tight clothing      After the Test: Drink plenty of water. After receiving IV contrast, you may experience a mild flushed feeling. This is normal. On occasion, you may experience a mild rash up to 24 hours after the test. This is not dangerous. If this occurs, you can take Benadryl  25 mg, Zyrtec, Claritin , or Allegra and increase your fluid intake. (Patients taking Tikosyn should avoid Benadryl , and may take Zyrtec, Claritin , or Allegra) If you experience trouble breathing, this can be serious. If it is severe call 911 IMMEDIATELY. If it is mild, please call our office.  We will call to schedule your test 2-4 weeks out understanding that some insurance companies will need an authorization prior to the service being performed.   For more information and frequently asked questions, please visit our website : [email or web address]  For non-scheduling related questions, please contact the cardiac imaging nurse navigator should you have any  questions/concerns: Cardiac Imaging Nurse Navigators Direct Office Dial: [phone]   For scheduling needs, including cancellations and rescheduling, please call [place], [phone].   Follow-Up: At [facility], you and your health needs are our priority.  As part of our continuing mission to provide you with exceptional heart care, our providers are all part of one team.  This team includes your primary Cardiologist (physician) and Advanced Practice Providers or APPs (Physician Assistants and Nurse Practitioners) who all work together to provide you with the care you need, when you need it.  Your next appointment:   2 month(s)  Provider:   Bertha Broad, MD    We recommend signing up for the patient portal called "MyChart".  Sign up information is provided on this After Visit Summary.  MyChart is used to connect with patients for Virtual Visits (Telemedicine).  Patients are able to view lab/test results, encounter notes, upcoming appointments, etc.  Non-urgent messages can be sent to your provider as well.   To learn more about what you can do with MyChart, go to [email or web address].   Other Instructions None

## 2024-02-13 NOTE — Progress Notes (Signed)
 PATIENT CHECK-IN and HEALTH RISK ASSESSMENT QUESTIONNAIRE:  -completed by phone/video for upcoming Medicare Preventive Visit    Pre-Visit Check-in: 1)Vitals (height, wt, BP, etc) - record in vitals section for visit on day of visit Request home vitals (wt, BP, etc.) and enter into vitals, THEN update Vital Signs SmartPhrase below at the top of the HPI. See below.  2)Review and Update Medications, Allergies PMH, Surgeries, Social history in Epic 3)Hospitalizations in the last year with date/reason?  COPD/Pneumonia  4)Review and Update Care Team (patient's specialists) in Epic 5) Complete PHQ9 in Epic  6) Complete Fall Screening in Epic 7)Review all Health Maintenance Due and order under PCP if not done.  Medicare Wellness Patient Questionnaire:  Answer theses question about your habits: How often do you have a drink containing alcohol?n How many drinks containing alcohol do you have on a typical day when you are drinking?na How often do you have six or more drinks on one occasion?na Have you ever smoked?yes, quit 2020 How many packs a day do/did you smoke? 1 Do you use smokeless tobacco?n Do you use an illicit drugs?n On average, how many days per week do you engage in moderate to strenuous exercise (like a brisk walk)?walks 10 min daily Typical diet: frozen meals  Beverages: unsweet tea, water  Answer theses question about your everyday activities: Can you perform most household chores?roommate helps- does 90% of housework Are you deaf or have significant trouble hearing?n Do you feel that you have a problem with memory?n Do you feel safe at home?y Last dentist visit? Hasn't been in some time - finally has dental coverage and plans to go soon 8. Do you have any difficulty performing your everyday activities?n Are you having any difficulty walking, taking medications on your own, and or difficulty managing daily home needs?n Do you have difficulty walking or climbing stairs?y Do  you have difficulty dressing or bathing?n Do you have difficulty doing errands alone such as visiting a doctor's office or shopping?n Do you currently have any difficulty preparing food and eating?n Do you currently have any difficulty using the toilet?n Do you have any difficulty managing your finances?n Do you have any difficulties with housekeeping of managing your housekeeping?n   Do you have Advanced Directives in place (Living Will, Healthcare Power or Attorney)? n   Last eye Exam and location? See eye doc on a regular basis, had cataract surgery and now has 20/20 vision   Do you currently use prescribed or non-prescribed narcotic or opioid pain medications?n     ----------------------------------------------------------------------------------------------------------------------------------------------------------------------------------------------------------------------  Because this visit was a virtual/telehealth visit, some criteria may be missing or patient reported. Any vitals not documented were not able to be obtained and vitals that have been documented are patient reported.    MEDICARE ANNUAL PREVENTIVE VISIT WITH PROVIDER: (Welcome to Medicare, initial annual wellness or annual wellness exam)  Virtual Visit via Phone Note  I connected with Abrey Braun [place] on 02/13/24 by phone and verified that I am speaking with the correct person using two identifiers. She prefers phone.  Location patient: [place] Location provider:work or home office Persons participating in the virtual visit: patient, provider  Concerns and/or follow up today: saw cardiologist today. Getting over viral illness. No fever. Cough. Nasal congestion. Reports sob at baseline.     See HM section in Epic for other details of completed HM.    ROS: negative for report of fevers, unintentional weight loss, vision changes, vision loss, hearing loss or change, chest pain,  sob, hemoptysis, melena,  hematochezia, hematuria, falls, bleeding or bruising, thoughts of suicide or self harm, memory loss  Patient-completed extensive health risk assessment - reviewed and discussed with the patient: See Health Risk Assessment completed with patient prior to the visit either above or in recent phone note. This was reviewed in detailed with the patient today and appropriate recommendations, orders and referrals were placed as needed per Summary below and patient instructions.   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 prior to study with DLCO  10.06 (48%) corrects to 2.32 (55%)  for alv volume and FV curve classic concave exp loop  - 06/29/2021  After extensive coaching inhaler device,  effectiveness =    90% with dpi, 75% with hfa    - 06/29/2021   Walked on RA x  3  lap(s) =  approx 750 ft @ mod fast pace, stop    Depression    Depression, major, single episode, moderate (HCC) 03/19/2008   Qualifier: Diagnosis of   By: Minnette Amato  MD, Ronie Cohen        Dyspnea    Emphysema, unspecified (HCC)    per patient mild case   Epigastric pain 10/09/2022   Esophageal dysphagia 05/11/2023   Family history of adverse reaction to anesthesia    patient states sister has a hard time waking up from anesthesia.    GERD 03/19/2008   Qualifier: Diagnosis of   By: Minnette Amato  MD, Ronie Cohen        Grief reaction 01/24/2022   Hepatitis B core antibody positive 10/09/2022   Hepatitis C    Hepatitis C, chronic (HCC) 03/14/2015   Hiatal hernia 10/09/2022   History of hepatitis C 10/09/2022   Left upper lobe pulmonary nodule 02/04/2018   IMPRESSION:  1. Negative for acute pulmonary embolus or aortic dissection  2. 14 mm left upper lobe pulmonary nodule, increased in size  compared to prior and therefore felt suspicious for carcinoma.  Consider correlation with PET-CT and/or tissue sampling.  3. Mild emphysema     Aortic Atherosclerosis (ICD10-I70.0) and Emphysema (ICD10-J43.9).        Electronically Signed    By: Esmeralda Hedge M.D   lung ca dx'd 03/2018   Lung mass 03/13/2018   Migraine headache 08/01/2010   Qualifier: Diagnosis of   By: Minnette Amato  MD, Ronie Cohen  NEPHROLITHIASIS, HX OF 03/19/2008   Qualifier: Diagnosis of   By: Minnette Amato  MD, Ronie Cohen        Nonrheumatic mitral (valve) stenosis    Pill dysphagia 10/09/2022   Pleural effusion 10/06/2021   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   SOB (shortness of breath) 09/18/2021   Tobacco use disorder 01/21/2014   Tremor 10/14/2012   Tremor, unspecified    non-specific; takes inderal .    Vaccine counseling 11/13/2017    Past Surgical History:  Procedure Laterality Date   ABDOMINAL HYSTERECTOMY  1986   menorrhagia; hx of cryo for abnormal paps   APPENDECTOMY     removed in 1986 per pt   BIOPSY  10/31/2022   Procedure: BIOPSY;  Surgeon: Normie Becton., MD;  Location: [facility];  Service: Gastroenterology;;   CATARACT EXTRACTION Bilateral 07/2022   ESOPHAGOGASTRODUODENOSCOPY (EGD) WITH PROPOFOL  N/A 10/31/2022   Procedure: ESOPHAGOGASTRODUODENOSCOPY (EGD) WITH PROPOFOL ;  Surgeon: Normie Becton., MD;  Location: [facility];  Service: Gastroenterology;  Laterality: N/A;   INTERCOSTAL NERVE BLOCK Right 12/18/2019   Procedure: Intercostal Nerve Block;  Surgeon: Zelphia Higashi, MD;  Location: [facility] OR;  Service: Thoracic;  Laterality: Right;   LOBECTOMY Left 03/13/2018   Procedure: Lingula section of left upper lobe lobectomy;  Surgeon: Zelphia Higashi, MD;  Location: [facility] OR;  Service: Thoracic;  Laterality: Left;   NODE DISSECTION  12/18/2019   Procedure: Node Dissection;  Surgeon: Zelphia Higashi, MD;  Location: [facility] OR;  Service: Thoracic;;   SAVORY DILATION N/A 10/31/2022   Procedure: Thurman Flores;  Surgeon: Normie Becton., MD;  Location: [facility];  Service: Gastroenterology;  Laterality: N/A;   VIDEO ASSISTED THORACOSCOPY (VATS)/WEDGE RESECTION Left 03/13/2018   Procedure: VIDEO ASSISTED THORACOSCOPY (VATS)/WEDGE RESECTION;  Surgeon: Zelphia Higashi, MD;  Location: [facility] OR;  Service: Thoracic;  Laterality: Left;    Social History   Socioeconomic History   Marital status: Widowed    Spouse name: Not on file   Number of children: Not on file   Years of education: Not on file   Highest education level: 12th grade  Occupational History   Not on file  Tobacco Use   Smoking status: Former    Current packs/day: 0.00    Average packs/day: 1 pack/day for 40.0 years (40.0 ttl pk-yrs)    Types: Cigarettes    Start date: 12/14/1977    Quit date: 12/14/2017    Years since quitting: 6.1    Passive exposure: Never   Smokeless tobacco: Never  Vaping Use   Vaping status: Never Used  Substance and Sexual Activity   Alcohol use: Yes    Alcohol/week: 7.0 standard drinks of alcohol    Types:  7 Shots of liquor per week    Comment: occasional bourbon   Drug use: Not Currently   Sexual activity: Never  Other Topics Concern   Not on file  Social History Narrative   Not on file   Social Drivers of Health   Financial Resource Strain: Low Risk  (08/15/2022)   Overall Financial Resource Strain (CARDIA)    Difficulty of Paying Living Expenses: Not hard at all  Food Insecurity: Food Insecurity Present (11/01/2023)   Hunger Vital Sign    Worried About Running Out of Food in the Last Year: Sometimes true    Ran Out of Food in the Last Year: Sometimes true  Transportation Needs: No Transportation Needs (11/01/2023)   PRAPARE - Transportation    Lack  of Transportation (Medical): No    Lack of Transportation (Non-Medical): No  Physical Activity: Insufficiently Active (08/15/2022)   Exercise Vital Sign    Days of Exercise per Week: 1 day    Minutes of Exercise per Session: 10 min  Stress: No Stress Concern Present (08/15/2022)   [employer] of Occupational Health - Occupational Stress Questionnaire    Feeling of Stress : Not at all  Social Connections: Moderately Isolated (10/27/2023)   Social Connection and Isolation Panel [NHANES]    Frequency of Communication with Friends and Family: More than three times a week    Frequency of Social Gatherings with Friends and Family: Once a week    Attends Religious Services: 1 to 4 times per year    Active Member of [employer] or Organizations: No    Attends Banker Meetings: Never    Marital Status: Widowed  Intimate Partner Violence: Not At Risk (11/01/2023)   Humiliation, Afraid, Rape, and Kick questionnaire    Fear of Current or Ex-Partner: No    Emotionally Abused: No    Physically Abused: No    Sexually Abused: No    Family History  Problem Relation Age of Onset   Asthma Mother    Alzheimer's disease Mother 2   Hypertension Mother    Other Father        shot    Neuropathy Sister        face   Cancer Maternal  Aunt        breast   Cancer Paternal Aunt    Lung cancer Paternal Uncle    Cancer Paternal Grandmother        abdominal   Colon cancer Neg Hx    Esophageal cancer Neg Hx    Inflammatory bowel disease Neg Hx    Liver disease Neg Hx    Pancreatic cancer Neg Hx    Rectal cancer Neg Hx    Stomach cancer Neg Hx     Current Outpatient Medications on File Prior to Visit  Medication Sig Dispense Refill   albuterol  (PROVENTIL ) (2.5 MG/3ML) 0.083% nebulizer solution Take 3 mLs (2.5 mg total) by nebulization every 4 (four) hours as needed for wheezing or shortness of breath. 75 mL 12   albuterol  (VENTOLIN  HFA) 108 (90 Base) MCG/ACT inhaler Inhale 2 puffs into the lungs every 6 (six) hours as needed for wheezing or shortness of breath. 8 g 0   Budeson-Glycopyrrol-Formoterol  (BREZTRI  AEROSPHERE) 160-9-4.8 MCG/ACT AERO Inhale 2 puffs into the lungs 2 (two) times daily. (Patient taking differently: Inhale 1 puff into the lungs 2 (two) times daily.) 3 each 4   clonazePAM  (KLONOPIN ) 0.5 MG tablet Take 1 tablet (0.5 mg total) by mouth 3 (three) times daily as needed. for anxiety 90 tablet 3   doxycycline  (VIBRAMYCIN ) 100 MG capsule Take 1 capsule (100 mg total) by mouth 2 (two) times daily. 20 capsule 0   esomeprazole  (NEXIUM ) 40 MG capsule Take 1 capsule (40 mg total) by mouth daily. (Patient taking differently: Take 40 mg by mouth in the morning and at bedtime.) 60 capsule 11   guaiFENesin  (MUCINEX ) 600 MG 12 hr tablet Take 600 mg by mouth daily.     hydrOXYzine  (VISTARIL ) 25 MG capsule Take 1 capsule (25 mg total) by mouth every 8 (eight) hours as needed. 30 capsule 0   PARoxetine  (PAXIL ) 40 MG tablet TAKE 1 TABLET BY MOUTH EVERY DAY 90 tablet 0   propranolol  (INDERAL ) 20 MG tablet Take 1 capsule (60  mg total) by mouth daily. 270 tablet 0   SUMAtriptan  (IMITREX ) 50 MG tablet TAKE 1 TAB BY MOUTH AT ONSET OF MIGRAINE. IF SYMTPOMS PERSIST, A SECOND DOSE MAY BE TAKEN IN 2 HOURS. NOT TO EXCEED 2 DOSE SIN A 24  HR PERIOD 20 tablet 3   topiramate  (TOPAMAX ) 25 MG tablet TAKE 1 TABLET BY MOUTH TWICE A DAY 60 tablet 0   traZODone  (DESYREL ) 100 MG tablet TAKE 1 TABLET (100 MG TOTAL) BY MOUTH AT BEDTIME AS NEEDED. FOR SLEEP. NEED PHYSICAL EXAM FOR FURTHER REFILLS 90 tablet 1   No current facility-administered medications on file prior to visit.    Allergies  Allergen Reactions   Aspirin Shortness Of Breath   Trelegy Ellipta  [Fluticasone -Umeclidin-Vilant] Shortness Of Breath    She reports that is made her cough worse.    Morphine Sulfate Other (See Comments)    RED STREAKS ON ARMS.   Oxycodone  Itching and Rash   Glycopyrrolate  Other (See Comments)    UNSPECIFIED REACTION  pt unsure if she is allergic.   Hydrocodone  Itching   Lactose Intolerance (Gi) Nausea And Vomiting    Milk only.Patient states that she can eat cheese.   Penicillins Other (See Comments)     Heartburn, upset stomach only & tolerated AMOXIL  after this reaction   Sulfa Antibiotics Tinitus       Physical Exam Vitals requested from patient and listed below if patient had equipment and was able to obtain at home for this virtual visit: There were no vitals filed for this visit. Estimated body mass index is 27.46 kg/m as calculated from the following:   Height as of an earlier encounter on 02/13/24: 5\' 4"  (1.626 m).   Weight as of an earlier encounter on 02/13/24: 160 lb (72.6 kg).  EKG (optional): deferred due to virtual visit  GENERAL: alert, oriented, no acute distress detected, full vision exam deferred due to pandemic and/or virtual encounter  PSYCH/NEURO: pleasant and cooperative, no obvious depression or anxiety, speech and thought processing grossly intact, Cognitive function grossly intact  Flowsheet Row Office Visit from 09/27/2023 in [facility]  PHQ-9 Total Score 0           02/13/2024    5:03 PM 09/27/2023    2:24 PM 01/30/2023    2:52 PM 10/11/2022    4:13 PM 08/31/2022     1:37 PM  Depression screen PHQ 2/9  Decreased Interest 0 0 0 0 0  Down, Depressed, Hopeless 0 0 0 0 0  PHQ - 2 Score 0 0 0 0 0  Altered sleeping  0 0 0 2  Tired, decreased energy  0 0 0 1  Change in appetite  0 0 0 0  Feeling bad or failure about yourself   0 0 0 0  Trouble concentrating  0 0 0 0  Moving slowly or fidgety/restless  0 0 0 0  Suicidal thoughts  0 0 0 0  PHQ-9 Score  0 0 0 3  Difficult doing work/chores  Not difficult at all Not difficult at all Not difficult at all Not difficult at all       07/26/2022    9:28 AM 08/31/2022    1:36 PM 10/11/2022    4:13 PM 01/30/2023    2:52 PM 02/13/2024    5:03 PM  Fall Risk  Falls in the past year?  0 0 0 0  Was there an injury with Fall?  0 0 0 0  Fall Risk Category Calculator  0 0 0 0  Fall Risk Category (Retired)  Low Low    (RETIRED) Patient Fall Risk Level High fall risk Low fall risk Low fall risk    Patient at Risk for Falls Due to  No Fall Risks No Fall Risks No Fall Risks   Fall risk Follow up  Falls evaluation completed;Education provided;Falls prevention discussed Falls evaluation completed Falls evaluation completed Falls evaluation completed;Education provided     SUMMARY AND PLAN:  Medicare annual wellness visit, subsequent  Discussed applicable health maintenance/preventive health measures and advised and referred or ordered per patient preferences: -she reports had tdap and will get record -she plans to schedule her dexa and mammogram with gyn, she declines referral for either at this time -she reports she has the cologuard test and agrees to complete Health Maintenance  Topic Date Due   Fecal DNA (Cologuard)  Never done   MAMMOGRAM  Never done   DEXA SCAN  Never done   DTaP/Tdap/Td (2 - Td or Tdap) 01/22/2024   INFLUENZA VACCINE  05/01/2024   Pneumonia Vaccine 28+ Years old (3 of 3 - PCV20 or PCV21) 09/03/2024   Medicare Annual Wellness (AWV)  02/12/2025   Hepatitis C Screening  Completed   Zoster  Vaccines- Shingrix  Completed   HPV VACCINES  Aged Out   Meningococcal B Vaccine  Aged Out   Colonoscopy  Discontinued   COVID-19 Vaccine  Discontinued      Education and counseling on the following was provided based on the above review of health and a plan/checklist for the patient, along with additional information discussed, was provided for the patient in the patient instructions :  -Provided info on completing advanced directives - see pt instructions -Advised and counseled on a healthy lifestyle - including the importance of a healthy diet, regular physical activity, social connections a -Reviewed patient's current diet. Advised and counseled on a whole foods based healthy diet. A summary of a healthy diet was provided in the Patient Instructions. We discussed healthy, easy quick whole food meals for each meal type -reviewed patient's current physical activity level and discussed exercise guidelines for adults. Discussed community resources and ideas for safe exercise at home to assist in meeting exercise guideline recommendations in a safe and healthy way.  -Advise yearly dental visits at minimum and regular eye exams   Follow up: see patient instructions     Patient Instructions  I really enjoyed getting to talk with you today! I am available on Tuesdays and Thursdays for virtual visits if you have any questions or concerns, or if I can be of any further assistance.   CHECKLIST FROM ANNUAL WELLNESS VISIT:  -Follow up (please call to schedule if not scheduled after visit):   -yearly for annual wellness visit with primary care office  Here is a list of your preventive care/health maintenance measures and the plan for each if any are due:  PLAN For any measures below that may be due:    1. Please complete the cologuard test and return ASAP   2. Please call the gynecology office and schedule your mammogram and bone density test asap   3. Please provide record for your Tdap  vaccine. Thanks!  Health Maintenance  Topic Date Due   Fecal DNA (Cologuard)  Never done   MAMMOGRAM  Never done   DEXA SCAN  Never done   DTaP/Tdap/Td (2 - Td or Tdap) 01/22/2024   INFLUENZA VACCINE  05/01/2024  Pneumonia Vaccine 80+ Years old (3 of 3 - PCV20 or PCV21) 09/03/2024   Medicare Annual Wellness (AWV)  02/12/2025   Hepatitis C Screening  Completed   Zoster Vaccines- Shingrix  Completed   HPV VACCINES  Aged Out   Meningococcal B Vaccine  Aged Out   Colonoscopy  Discontinued   COVID-19 Vaccine  Discontinued    -See a dentist at least yearly  -Get your eyes checked and then per your eye specialist's recommendations  -Other issues addressed today:   -I have included below further information regarding a healthy whole foods based diet, physical activity guidelines for adults, stress management and opportunities for social connections. I hope you find this information useful.   -----------------------------------------------------------------------------------------------------------------------------------------------------------------------------------------------------------------------------------------------------------    NUTRITION: -eat real food: lots of colorful vegetables (half the plate) and fruits -5-7 servings of vegetables and fruits per day (fresh or steamed is best), exp. 2 servings of vegetables with lunch and dinner and 2 servings of fruit per day. Berries and greens such as kale and collards are great choices.  -consume on a regular basis:  fresh fruits, fresh veggies, fish, nuts, seeds, healthy oils (such as olive oil, avocado oil), whole grains (make sure for bread/pasta/crackers/etc., that the first ingredient on label contains the word "whole"), legumes. -can eat small amounts of dairy and lean meat (no larger than the palm of your hand), but avoid processed meats such as ham, bacon, lunch meat, etc. -drink water -try to avoid fast food and  pre-packaged foods, processed meat, ultra processed foods/beverages (donuts, candy, etc.) -most experts advise limiting sodium to < 2300mg  per day, should limit further is any chronic conditions such as high blood pressure, heart disease, diabetes, etc. The American Heart Association advised that < 1500mg  is is ideal -try to avoid foods/beverages that contain any ingredients with names you do not recognize  -try to avoid foods/beverages  with added sugar or sweeteners/sweets  -try to avoid sweet drinks (including diet drinks): soda, juice, Gatorade, sweet tea, power drinks, diet drinks -try to avoid white rice, white bread, pasta (unless whole grain)  EXERCISE GUIDELINES FOR ADULTS: -if you wish to increase your physical activity, do so gradually and with the approval of your doctor -STOP and seek medical care immediately if you have any chest pain, chest discomfort or trouble breathing when starting or increasing exercise  -move and stretch your body, legs, feet and arms when sitting for long periods -Physical activity guidelines for optimal health in adults: -get at least 150 minutes per week of moderate exercise (can talk, but not sing); this is about 20-30 minutes of sustained activity 5-7 days per week or two 10-15 minute episodes of sustained activity 5-7 days per week -do some muscle building/resistance training/strength training at least 2 days per week  -balance exercises 3+ days per week:   Stand somewhere where you have something sturdy to hold onto if you lose balance    1) lift up on toes, then back down, start with 5x per day and work up to 20x   2) stand and lift one leg straight out to the side so that foot is a few inches of the floor, start with 5x each side and work up to 20x each side   3) stand on one foot, start with 5 seconds each side and work up to 20 seconds on each side  If you need ideas or help with getting more active:  -Silver  sneakers https://tools.silversneakers.com  -Walk with a Doc: http://www.duncan-williams.com/  -  try to include resistance (weight lifting/strength building) and balance exercises twice per week: or the following link for ideas: http://castillo-powell.com/  BuyDucts.dk  STRESS MANAGEMENT: -can try meditating, or just sitting quietly with deep breathing while intentionally relaxing all parts of your body for 5 minutes daily -if you need further help with stress, anxiety or depression please follow up with your primary doctor or contact the wonderful folks at [facility]: [phone]  SOCIAL CONNECTIONS: -options in [place] if you wish to engage in more social and exercise related activities:  -Silver sneakers https://tools.silversneakers.com  -Walk with a Doc: http://www.duncan-williams.com/  -Check out the [facility] Active Adults 50+ section on the [place] [employer] (hiking clubs, book clubs, cards and games, chess, exercise classes, aquatic classes and much more) - see the website for details: https://[email or web address].gov/departments/parks-recreation/active-adults50  -YouTube has lots of exercise videos for different ages and abilities as well  -[facility] (a variety of indoor and outdoor inperson activities for adults). [phone]. [address]  -Virtual Online Classes (a variety of topics): see seniorplanet.org or call [phone]  -consider volunteering at a school, hospice center, church, senior center or elsewhere            Maurie Southern, DO

## 2024-02-20 ENCOUNTER — Ambulatory Visit (INDEPENDENT_AMBULATORY_CARE_PROVIDER_SITE_OTHER): Payer: Medicare Other | Admitting: Neurology

## 2024-02-20 ENCOUNTER — Encounter: Payer: Self-pay | Admitting: Neurology

## 2024-02-20 VITALS — BP 118/60 | HR 80 | Ht 64.0 in | Wt 160.0 lb

## 2024-02-20 DIAGNOSIS — R251 Tremor, unspecified: Secondary | ICD-10-CM

## 2024-02-20 NOTE — Patient Instructions (Signed)
 You have a tremor of both hands, likely due to stress, medication effect, caffeine  intake.   I do not see any signs or symptoms of parkinson's like disease or what we call parkinsonism.  For your tremor, I would not recommend any new medications at this time. In fact, due to your shortness of breath, I recommend, you stop the propranolol , talk to your lung specialist and your PCP about it.   Please remember, that any kind of tremor may be exacerbated by anxiety, anger, nervousness, excitement, dehydration, sleep deprivation, thyroid  dysfunction, by caffeine , and low blood sugar values or blood sugar fluctuations. Some medications can exacerbate tremors, this includes certain asthma or COPD medications (including your nebulizer) and certain antidepressants, including your Paxil .   Please work on cutting out all caffeine  and increase your water intake to 64 oz per day.   Also, talk to your primary care about seeing a liver specialist.

## 2024-02-20 NOTE — Progress Notes (Signed)
 Subjective:    Patient ID: [name] is a 68 y.o. female.  HPI    [name], MD, PhD [facility] [address] [address] [place], [place] [address]  Dear Randel Buss,  I saw your patient, [name], upon your kind request in my neurologic clinic today for initial consultation of her tremors.  The patient is unaccompanied today.  As you know, [name] is a 68 year old female with an underlying complex medical history of lung cancer, COPD, pneumonia with recent hospitalization and history of acute on chronic respiratory failure, on oxygen  therapy, prior smoking, depression, reflux disease, history of hepatitis, history of chronic liver disease, and mildly overweight state, who reports significant tremors affecting both upper extremities and sometimes her whole body.  She is not aware of any family history of tremors.  She does endorse stress.  She is currently not followed by a liver specialist, was treated for chronic hepatitis C in 2018.  She uses a nebulizer up to 3 times a day as needed.  She does not always hydrate well.  She drinks maybe 2 large cups of water per day and drinks caffeine  in the form of coffee, half a cup per day 2 large cups of unsweetened tea per day.  She quit smoking about 5 years ago, she does not drink any alcohol.  She has been on propranolol  20 mg daily but reports that it does cause her some difficulty breathing.  She reports that her pulmonologist recommended that she stop taking it.  She does find it helpful for her tremor.  Of note, she is also on Paxil  40 mg daily.  I reviewed your office note from 11/08/2023.  She was admitted to the hospital in January 2025.  She had wheezing and shortness of status post right lower lobectomy and left upper lobectomy several years ago.  She was diagnosed with pneumonia, no evidence of PE.    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 prior to study with DLCO  10.06 (48%) corrects to 2.32 (55%)  for alv volume and FV curve classic concave exp loop  - 06/29/2021  After extensive coaching inhaler device,  effectiveness =    90% with dpi, 75% with hfa    - 06/29/2021   Walked on RA x  3  lap(s) =  approx 750 ft @ mod fast pace, stop   Depression    Depression, major, single episode, moderate (HCC) 03/19/2008   Qualifier: Diagnosis of   By: Minnette Amato  MD, Ronie Cohen        Dyspnea    Emphysema, unspecified (HCC)    per patient  mild case   Epigastric pain 10/09/2022   Esophageal dysphagia 05/11/2023   Family history of adverse reaction to anesthesia    patient states sister has a hard time waking up from anesthesia.    GERD 03/19/2008   Qualifier: Diagnosis of   By: Minnette Amato  MD, Ronie Cohen        Grief reaction 01/24/2022   Hepatitis B core antibody positive 10/09/2022   Hepatitis C    Hepatitis C, chronic (HCC) 03/14/2015   Hiatal hernia 10/09/2022   History of hepatitis C 10/09/2022   Left upper lobe pulmonary nodule 02/04/2018   IMPRESSION:  1. Negative for acute pulmonary embolus or aortic dissection  2. 14 mm left upper lobe pulmonary nodule, increased in size  compared to prior and therefore felt  suspicious for carcinoma.  Consider correlation with PET-CT and/or tissue sampling.  3. Mild emphysema     Aortic Atherosclerosis (ICD10-I70.0) and Emphysema (ICD10-J43.9).        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    Vaccine counseling 11/13/2017    Her Past Surgical History Is Significant For: Past Surgical History:  Procedure Laterality Date   ABDOMINAL HYSTERECTOMY  1986   menorrhagia; hx of cryo for abnormal paps   APPENDECTOMY     removed in 1986 per pt   BIOPSY  10/31/2022   Procedure: BIOPSY;  Surgeon: Normie Becton., MD;  Location: [facility];  Service: Gastroenterology;;   CATARACT EXTRACTION Bilateral 07/2022   ESOPHAGOGASTRODUODENOSCOPY (EGD) WITH PROPOFOL  N/A 10/31/2022   Procedure: ESOPHAGOGASTRODUODENOSCOPY (EGD) WITH PROPOFOL ;  Surgeon: Normie Becton., MD;  Location: [facility];  Service: Gastroenterology;  Laterality: N/A;   INTERCOSTAL NERVE BLOCK Right 12/18/2019   Procedure: Intercostal Nerve Block;  Surgeon: Zelphia Higashi, MD;  Location[facility] OR;  Service: Thoracic;  Laterality: Right;   LOBECTOMY Left 03/13/2018   Procedure: Lingula section of left upper lobe lobectomy;  Surgeon: Zelphia Higashi, MD;  Location: [facility] OR;  Service: Thoracic;  Laterality: Left;   NODE DISSECTION  12/18/2019   Procedure: Node Dissection;  Surgeon: Zelphia Higashi, MD;  Location[facility] OR;  Service: Thoracic;;   SAVORY  DILATION N/A 10/31/2022   Procedure: Thurman Flores;  Surgeon: Normie Becton., MD;  Location: [facility];  Service: Gastroenterology;  Laterality: N/A;   VIDEO ASSISTED THORACOSCOPY (VATS)/WEDGE RESECTION Left 03/13/2018   Procedure: VIDEO ASSISTED THORACOSCOPY (VATS)/WEDGE RESECTION;  Surgeon: Zelphia Higashi, MD;  Location: [facility] OR;  Service: Thoracic;  Laterality: Left;    Her Family History Is Significant For: Family History  Problem Relation Age of Onset   Asthma Mother    Alzheimer's disease Mother 89   Hypertension Mother    Other Father        shot    Neuropathy Sister        face  Cancer Maternal Aunt        breast   Cancer Paternal Aunt    Lung cancer Paternal Uncle    Cancer Paternal Grandmother        abdominal   Colon cancer Neg Hx    Esophageal cancer Neg Hx    Inflammatory bowel disease Neg Hx    Liver disease Neg Hx    Pancreatic cancer Neg Hx    Rectal cancer Neg Hx    Stomach cancer Neg Hx    Tremor Neg Hx    Parkinson's disease Neg Hx     Her Social History Is Significant For: Social History   Socioeconomic History   Marital status: Widowed    Spouse name: Not on file   Number of children: Not on file   Years of education: Not on file   Highest education level: 12th grade  Occupational History   Not on file  Tobacco Use   Smoking status: Former    Current packs/day: 0.00    Average packs/day: 1 pack/day for 40.0 years (40.0 ttl pk-yrs)    Types: Cigarettes    Start date: 12/14/1977    Quit date: 12/14/2017    Years since quitting: 6.1    Passive exposure: Never   Smokeless tobacco: Never  Vaping Use   Vaping status: Never Used  Substance and Sexual Activity   Alcohol use: Not Currently    Comment: occasional bourbon   Drug use: Not Currently   Sexual activity: Never  Other Topics Concern   Not on file  Social History Narrative   Pt has roommate    Retired    Teacher, early years/pre Strain: Low  Risk  (08/15/2022)   Overall Financial Resource Strain (CARDIA)    Difficulty of Paying Living Expenses: Not hard at all  Food Insecurity: Food Insecurity Present (11/01/2023)   Hunger Vital Sign    Worried About Running Out of Food in the Last Year: Sometimes true    Ran Out of Food in the Last Year: Sometimes true  Transportation Needs: No Transportation Needs (11/01/2023)   PRAPARE - Administrator, Civil Service (Medical): No    Lack of Transportation (Non-Medical): No  Physical Activity: Insufficiently Active (08/15/2022)   Exercise Vital Sign    Days of Exercise per Week: 1 day    Minutes of Exercise per Session: 10 min  Stress: No Stress Concern Present (08/15/2022)   [employer] of Occupational Health - Occupational Stress Questionnaire    Feeling of Stress : Not at all  Social Connections: Moderately Isolated (10/27/2023)   Social Connection and Isolation Panel [NHANES]    Frequency of Communication with Friends and Family: More than three times a week    Frequency of Social Gatherings with Friends and Family: Once a week    Attends Religious Services: 1 to 4 times per year    Active Member of [employer] or Organizations: No    Attends Banker Meetings: Never    Marital Status: Widowed    Her Allergies Are:  Allergies  Allergen Reactions   Aspirin Shortness Of Breath   Trelegy Ellipta  [Fluticasone -Umeclidin-Vilant] Shortness Of Breath    She reports that is made her cough worse.    Morphine Sulfate Other (See Comments)    RED STREAKS ON ARMS.   Oxycodone  Itching and Rash   Glycopyrrolate  Other (See Comments)    UNSPECIFIED REACTION  pt unsure if she is allergic.  Hydrocodone  Itching   Lactose Intolerance (Gi) Nausea And Vomiting    Milk only.Patient states that she can eat cheese.   Penicillins Other (See Comments)     Heartburn, upset stomach only & tolerated AMOXIL  after this reaction   Sulfa Antibiotics Tinitus  :   Her Current  Medications Are:  Outpatient Encounter Medications as of 02/20/2024  Medication Sig   albuterol  (PROVENTIL ) (2.5 MG/3ML) 0.083% nebulizer solution Take 3 mLs (2.5 mg total) by nebulization every 4 (four) hours as needed for wheezing or shortness of breath.   albuterol  (VENTOLIN  HFA) 108 (90 Base) MCG/ACT inhaler Inhale 2 puffs into the lungs every 6 (six) hours as needed for wheezing or shortness of breath.   Budeson-Glycopyrrol-Formoterol  (BREZTRI  AEROSPHERE) 160-9-4.8 MCG/ACT AERO Inhale 2 puffs into the lungs 2 (two) times daily. (Patient taking differently: Inhale 1 puff into the lungs 2 (two) times daily.)   clonazePAM  (KLONOPIN ) 0.5 MG tablet Take 1 tablet (0.5 mg total) by mouth 3 (three) times daily as needed. for anxiety   esomeprazole  (NEXIUM ) 40 MG capsule Take 1 capsule (40 mg total) by mouth daily. (Patient taking differently: Take 40 mg by mouth in the morning and at bedtime.)   guaiFENesin  (MUCINEX ) 600 MG 12 hr tablet Take 600 mg by mouth daily.   PARoxetine  (PAXIL ) 40 MG tablet TAKE 1 TABLET BY MOUTH EVERY DAY   propranolol  (INDERAL ) 20 MG tablet Take 1 capsule (60 mg total) by mouth daily.   traZODone  (DESYREL ) 100 MG tablet TAKE 1 TABLET (100 MG TOTAL) BY MOUTH AT BEDTIME AS NEEDED. FOR SLEEP. NEED PHYSICAL EXAM FOR FURTHER REFILLS   doxycycline  (VIBRAMYCIN ) 100 MG capsule Take 1 capsule (100 mg total) by mouth 2 (two) times daily.   hydrOXYzine  (VISTARIL ) 25 MG capsule Take 1 capsule (25 mg total) by mouth every 8 (eight) hours as needed.   metoprolol  tartrate (LOPRESSOR ) 100 MG tablet Take 1 tablet (100 mg total) by mouth once for 1 dose. Please take this medication 2 hours before CT   SUMAtriptan  (IMITREX ) 50 MG tablet TAKE 1 TAB BY MOUTH AT ONSET OF MIGRAINE. IF SYMTPOMS PERSIST, A SECOND DOSE MAY BE TAKEN IN 2 HOURS. NOT TO EXCEED 2 DOSE SIN A 24 HR PERIOD   topiramate  (TOPAMAX ) 25 MG tablet TAKE 1 TABLET BY MOUTH TWICE A DAY   No facility-administered encounter medications  on file as of 02/20/2024.  :   Review of Systems:  Out of a complete 14 point review of systems, all are reviewed and negative with the exception of these symptoms as listed below:          Review of Systems  Neurological:        Pt here for tremors Pt states she has full body tremors Pt states currently on inderal  20 mg daily for 12 years Pt on continuous oxygen  2L  Pt was admitted  to hospital Jan 2025 for Pneumonia     Objective:  Neurological Exam  Physical Exam Physical Examination:   Vitals:   02/20/24 1459  BP: 118/60  Pulse: 80    General Examination: The patient is a very pleasant 68 y.o. female in no acute distress. She appears chronically ill.  She is currently on supplemental oxygen  via oxygen  concentrator, portable.   HEENT: Normocephalic, atraumatic, pupils are equal, round and reactive to light, extraocular tracking is good without limitation to gaze excursion or nystagmus noted. Hearing is grossly intact. Face is symmetric with normal facial animation. Speech is clear with  no dysarthria noted. There is no hypophonia. There is no lip, neck/head, jaw or voice tremor. Neck is supple with full range of passive and active motion. There are no carotid bruits on auscultation. Oropharynx exam reveals: Moderate mouth dryness, adequate dental hygiene, mild airway crowding, tongue protrudes centrally and palate elevates symmetrically.    Chest: Clear to auscultation without wheezing, rhonchi or crackles noted.  Heart: S1+S2+0, regular and normal without murmurs, rubs or gallops noted.   Abdomen: Soft, non-tender and non-distended.  Extremities: There is no pitting edema in the distal lower extremities bilaterally.  Appearing discoloration in both feet.  Skin: Warm and dry without trophic changes noted.   Musculoskeletal: exam reveals no obvious joint deformities.   Neurologically:  Mental status: The patient is awake, alert and oriented in all 4 spheres. Her  immediate and remote memory, attention, language skills and fund of knowledge are appropriate. There is no evidence of aphasia, agnosia, apraxia or anomia. Speech is clear with normal prosody and enunciation. Thought process is linear. Mood is normal and affect is normal.  Cranial nerves II - XII are as described above under HEENT exam.  Motor exam: Normal bulk, strength and tone is noted. There is no obvious resting tremor.  She has a bilateral upper extremity mild to moderate postural tremor, mild action tremor, no intention tremor.  On Archimedes spiral drawing she has mild trembling of the left hand, mainly with the right hand, handwriting is legible, slightly tremulous, not micrographic.  She has no lower extremity tremor.  Fine motor skills and coordination: grossly intact with finger taps, hand movements and rapid alternating patting with both upper extremities, normal foot taps bilaterally in the lower extremities.  Cerebellar testing: No dysmetria or intention tremor. There is no truncal or gait ataxia.  Normal finger-to-nose, normal heel-to-shin bilaterally. Reflexes 1+ in the upper extremities and trace in the lower extremities.  Toes are downgoing bilaterally. Sensory exam: intact to light touch in the upper and lower extremities.  Gait, station and balance: She stands without difficulty, she walks with a limp or shuffling.  She has preserved arm swing.   Assessment and Plan:  In summary, Latonya Nelon is a very pleasant 69 y.o.-year old female with an underlying complex medical history of lung cancer, COPD, pneumonia with recent hospitalization and history of acute on chronic respiratory failure, on oxygen  therapy, prior smoking, depression, reflux disease, history of hepatitis, history of chronic liver disease, and mildly overweight state, who presents for evaluation of her tremor condition.  History and examination are not telltale for essential tremor, tremor is likely  multifactorial, etiology includes stress, excessive caffeine  intake, suboptimal hydration, medications such as Paxil  and nebulizer/inhaler for her chronic obstructive pulmonary disease.  I had a long discussion with the patient today.  She was reassured obvious signs of parkinsonism.  She is advised to talk to her pulmonologist about coming off of propranolol .  She is furthermore advised to check in with with you about seeing a hepatologist, as she has not seen any liver specialist in a while.  Chronic liver disease can also contribute to tremors.  She is advised to gradually eliminate caffeine  and increase her water intake to about 64 ounces per day.  She is advised that I would not recommend any additional or new medication for symptomatic treatment of her tremor.  We talked about the importance of supportive care.  She is advised to follow-up with your office and her specialists as scheduled.  I answered all  her questions today and she was in agreement.   Thank you very much for allowing me to participate in the care of this nice patient. If I can be of any further assistance to you please do not hesitate to call me at [phone].  Sincerely,   [name], MD, PhD

## 2024-02-26 ENCOUNTER — Telehealth: Payer: Self-pay

## 2024-02-26 NOTE — Telephone Encounter (Signed)
 Copied from CRM [id number]. Topic: General - Other >> Feb 26, 2024 10:34 AM [name] wrote: Reason for CRM: Patient calling in, states her insurance company is asking for a letter stating she is still in need of supplemental oxygen . Would like a call back to discuss.  I called and spoke to pt. Pt states her insurance, [employer] medicare advantage, needs proof or a letter stating that she does need supplemental O2. I informed pt that she would need to contact her [employer] and have them fax over papers stating exactly what they need and we could help with this then. Pt verbalized understanding and she was given our fax number. NFN until we receive the papers needed from the insurance company.

## 2024-02-28 ENCOUNTER — Ambulatory Visit: Payer: Self-pay | Admitting: Internal Medicine

## 2024-02-28 NOTE — Telephone Encounter (Addendum)
 Chief Complaint: SOB Symptoms: wheeze, green productive cough Frequency: x 3 weeks Pertinent Negatives: Patient denies fever, CP, severe SOB, URI sx Disposition: [] ED /[] Urgent Care (no appt availability in office) / [x] Appointment(In office/virtual)/ []  [facility]/ [] Home Care/ [] Refused Recommended Disposition /[] [place] [facility]/ []  Follow-up with PCP Additional Notes: Pt c/o SOB, wheezing, green productive cough x 3 weeks. Pt has recently finished round of doxyclycline (tick tx) and had hoped for sx to resolve. Pt still experiencing sx along with green productive cough. Pt does endorse mild SOB above baseline and lower SpO2 numbers. Pt is on 2L O2 at baseline and maintaining sats > 90%. Pt reports taking respiratory medications as instructed, as well as albuterol  NEB that has been providing relief. Triager reinforced albuterol  usage. Triager attempted to schedule with [facility], but no access. Scheduled patient on 03/02/2024 with PC instead. Triager strongly reinforced to see ED/UC for any worsening sx over the weekend, or if albuterol  is no longer providing relief/hypoxia. Patient verbalized understanding.   Additionally, pt also needing [facility] F/U for DME recertification. Triager scheduled with primary pulmonologist at soonest available OV.    Copied from CRM [id number]. Topic: Clinical - Red Word Triage >> Feb 28, 2024  2:54 PM Margarette Shawl wrote: Red Word that prompted transfer to Nurse Triage:   Having a COPD exacerbation for past several weeks Wheezing Coughing  SOB Chest tightness  Also needs appt for re certification of oxygen  through [employer] Reason for Disposition  [1] MILD difficulty breathing (e.g., minimal/no SOB at rest, SOB with walking, pulse <100) AND [2] NEW-onset or WORSE than normal  [1] Known COPD or other severe lung disease (i.e., bronchiectasis, cystic fibrosis, lung surgery) AND [2] worsening symptoms (i.e., increased sputum purulence or amount, increased  breathing difficulty  Answer Assessment - Initial Assessment Questions E2C2 Pulmonary Triage - Initial Assessment Questions "Chief Complaint (e.g., cough, sob, wheezing, fever, chills, sweat or additional symptoms) *Go to specific symptom protocol after initial questions. COPD flare, wheezing, chest tightness, green productive cough Recently on doxy for tick bite but did not resolve  "How long have symptoms been present?" X 3 weeks  Have you tested for COVID or Flu? Note: If not, ask patient if a home test can be taken. If so, instruct patient to call back for positive results. No  MEDICINES:   "Have you used any OTC meds to help with symptoms?" No If yes, ask "What medications?" N/a  "Have you used your inhalers/maintenance medication?" Yes If yes, "What medications?" Breztri  - 2 puffs twice Albuterol  NEB - uses q4-6 hrs PRN  If inhaler, ask "How many puffs and how often?" Note: Review instructions on medication in the chart. See above  OXYGEN : "Do you wear supplemental oxygen ?" Yes If yes, "How many liters are you supposed to use?" 2L ATC  "Do you monitor your oxygen  levels?" Yes If yes, "What is your reading (oxygen  level) today?" 90 RA 94 with O2 High 80s with exertion on RA  "What is your usual oxygen  saturation reading?"  (Note: Pulmonary O2 sats should be 90% or greater) 95-96 RA 99 with O2    3. PATTERN "Does the difficult breathing come and go, or has it been constant since it started?"      constant 4. SEVERITY: "How bad is your breathing?" (e.g., mild, moderate, severe)    - MILD: No SOB at rest, mild SOB with walking, speaks normally in sentences, can lie down, no retractions, pulse < 100.    -  MODERATE: SOB at rest, SOB with minimal exertion and prefers to sit, cannot lie down flat, speaks in phrases, mild retractions, audible wheezing, pulse 100-120.    - SEVERE: Very SOB at rest, speaks in single words, struggling to breathe, sitting hunched forward,  retractions, pulse > 120      Mild-mod - SOB/Wheeze at rest 5. RECURRENT SYMPTOM: "Have you had difficulty breathing before?" If Yes, ask: "When was the last time?" and "What happened that time?"      Yes, February/March Typically gets abx/steroids 6. CARDIAC HISTORY: "Do you have any history of heart disease?" (e.g., heart attack, angina, bypass surgery, angioplasty)      Denies Endorses mitral valve stenosis 7. LUNG HISTORY: "Do you have any history of lung disease?"  (e.g., pulmonary embolus, asthma, emphysema)     COPD 8. CAUSE: "What do you think is causing the breathing problem?"      flare 9. OTHER SYMPTOMS: "Do you have any other symptoms? (e.g., dizziness, runny nose, cough, chest pain, fever)     Chest tightness Increased SOB/wheezing above baseline  Protocols used: Breathing Difficulty-A-AH, Cough - Acute Productive-A-AH

## 2024-03-02 ENCOUNTER — Encounter: Payer: Self-pay | Admitting: Family Medicine

## 2024-03-02 ENCOUNTER — Ambulatory Visit

## 2024-03-02 ENCOUNTER — Ambulatory Visit (INDEPENDENT_AMBULATORY_CARE_PROVIDER_SITE_OTHER): Admitting: Family Medicine

## 2024-03-02 VITALS — BP 118/90 | HR 88 | Temp 98.2°F | Resp 20 | Ht 64.0 in | Wt 162.4 lb

## 2024-03-02 DIAGNOSIS — R0602 Shortness of breath: Secondary | ICD-10-CM

## 2024-03-02 DIAGNOSIS — J441 Chronic obstructive pulmonary disease with (acute) exacerbation: Secondary | ICD-10-CM

## 2024-03-02 DIAGNOSIS — R030 Elevated blood-pressure reading, without diagnosis of hypertension: Secondary | ICD-10-CM

## 2024-03-02 DIAGNOSIS — J449 Chronic obstructive pulmonary disease, unspecified: Secondary | ICD-10-CM | POA: Diagnosis not present

## 2024-03-02 MED ORDER — PREDNISONE 20 MG PO TABS: 40.0000 mg | ORAL_TABLET | Freq: Every day | ORAL | 0 refills | Status: AC

## 2024-03-02 NOTE — Telephone Encounter (Signed)
 Pt seen today 03/02/24 by her PCP for acute visit

## 2024-03-02 NOTE — Patient Instructions (Addendum)
 A few things to remember from today's visit:  SOB (shortness of breath) - Plan: DG Chest 2 View, CBC with Differential/Platelet  COPD with exacerbation (HCC) - Plan: DG Chest 2 View, CBC with Differential/Platelet, predniSONE  (DELTASONE ) 20 MG tablet Take Prednisone  with breakfast. Continue Albuterol  and Breztri  same dose. Monitor for fever.  Do not use My Chart to request refills or for acute issues that need immediate attention. If you send a my chart message, it may take a few days to be addressed, specially if I am not in the office.  Please be sure medication list is accurate. If a new problem present, please set up appointment sooner than planned today.

## 2024-03-02 NOTE — Progress Notes (Signed)
 ACUTE VISIT Chief Complaint  Patient presents with   Shortness of Breath    Pt reports COPD flare. SOB and cough 3-4 wks ago.  Pulmonology is book out until August. Also chest tightness for 3-4 wks. Pt updated she been to cardiology within 3wks. No heart issues.     Cough    Coughing up green phlegms.    HPI: [name] is a 68 y.o. female with a PMHx significant for COPD GOLD 3, right and left lung adenocarcinoma, GERD, depression, anxiety, and hepatitis C who is here today complaining of COPD exacerbation.   She complains of SOB, productive cough, wheezing, and chest tightness for 3-4 weeks.  Also endorses chronic rhinorrhea.  She says her symptoms are unchanged since her onset.  She is on constant 2L oxygen , unchanged from baseline.  Also uses albuterol  neb tid and Breztri  inhalers bid around the clock. Also states she recently completed a ten days course of doxycycline  started after a visit on 5/8 for a tick bite.  She most recently took prednisone  twice in January in the hospital.  She has an appointment scheduled with pulmonology in August/2025.  Pertinent negatives include fever, chills, or blood in her sputum.   Chest CT from 11/28/2023 Impression:  1.) Interval clearing of right upper lobe airspace disease. No active pneumonic process or suspicious nodules are seen.  2.) Emphysema.  3.) Postsurgical changes and associated scarring in both lungs.  4.) Aortic and coronary artery atherosclerosis.  5.) Mild diffuse bronchial thickening still seen but improved in the interval.  6.) Mild hepatic steatosis.  7.) Osteopenia, kyphosis, and degenerative change.   Lab Results  Component Value Date   WBC 6.8 10/30/2023   HGB 10.6 (L) 10/30/2023   HCT 34.1 (L) 10/30/2023   MCV 83.8 10/30/2023   PLT 237 10/30/2023   Recently evaluated by cardiologist for mitral valve stenosis. Echo in 10/2023 LVEF 60-65% and grade I diastolic dysfunction. Echo has been  arranged.  Review of Systems  Constitutional:  Positive for activity change and fatigue. Negative for appetite change.  Cardiovascular:  Negative for palpitations and leg swelling.  Gastrointestinal:  Negative for abdominal pain and nausea.  Endocrine: Negative for cold intolerance and heat intolerance.  Genitourinary:  Negative for decreased urine volume and hematuria.  Skin:  Negative for rash.  Allergic/Immunologic: Positive for environmental allergies.  Neurological:  Negative for syncope and weakness.  Psychiatric/Behavioral:  Negative for confusion and hallucinations.   See other pertinent positives and negatives in HPI.  Current Outpatient Medications on File Prior to Visit  Medication Sig Dispense Refill   albuterol  (PROVENTIL ) (2.5 MG/3ML) 0.083% nebulizer solution Take 3 mLs (2.5 mg total) by nebulization every 4 (four) hours as needed for wheezing or shortness of breath. 75 mL 12   albuterol  (VENTOLIN  HFA) 108 (90 Base) MCG/ACT inhaler Inhale 2 puffs into the lungs every 6 (six) hours as needed for wheezing or shortness of breath. 8 g 0   Budeson-Glycopyrrol-Formoterol  (BREZTRI  AEROSPHERE) 160-9-4.8 MCG/ACT AERO Inhale 2 puffs into the lungs 2 (two) times daily. (Patient taking differently: Inhale 1 puff into the lungs 2 (two) times daily.) 3 each 4   clonazePAM  (KLONOPIN ) 0.5 MG tablet Take 1 tablet (0.5 mg total) by mouth 3 (three) times daily as needed. for anxiety 90 tablet 3   esomeprazole  (NEXIUM ) 40 MG capsule Take 1 capsule (40 mg total) by mouth daily. (Patient taking differently: Take 40 mg by mouth in the morning and at bedtime.) 60  capsule 11   guaiFENesin  (MUCINEX ) 600 MG 12 hr tablet Take 600 mg by mouth daily.     hydrOXYzine  (VISTARIL ) 25 MG capsule Take 1 capsule (25 mg total) by mouth every 8 (eight) hours as needed. 30 capsule 0   PARoxetine  (PAXIL ) 40 MG tablet TAKE 1 TABLET BY MOUTH EVERY DAY 90 tablet 0   propranolol  (INDERAL ) 20 MG tablet Take 1 capsule (60  mg total) by mouth daily. 270 tablet 0   traZODone  (DESYREL ) 100 MG tablet TAKE 1 TABLET (100 MG TOTAL) BY MOUTH AT BEDTIME AS NEEDED. FOR SLEEP. NEED PHYSICAL EXAM FOR FURTHER REFILLS 90 tablet 1   metoprolol  tartrate (LOPRESSOR ) 100 MG tablet Take 1 tablet (100 mg total) by mouth once for 1 dose. Please take this medication 2 hours before CT 1 tablet 0   SUMAtriptan  (IMITREX ) 50 MG tablet TAKE 1 TAB BY MOUTH AT ONSET OF MIGRAINE. IF SYMTPOMS PERSIST, A SECOND DOSE MAY BE TAKEN IN 2 HOURS. NOT TO EXCEED 2 DOSE SIN A 24 HR PERIOD (Patient not taking: Reported on 03/02/2024) 20 tablet 3   topiramate  (TOPAMAX ) 25 MG tablet TAKE 1 TABLET BY MOUTH TWICE A DAY (Patient not taking: Reported on 03/02/2024) 60 tablet 0   No current facility-administered medications on file prior to visit.    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 prior to study with DLCO  10.06 (48%) corrects to 2.32 (55%)  for alv volume and FV curve classic concave exp loop  - 06/29/2021  After extensive coaching inhaler device,  effectiveness =    90% with dpi, 75% with hfa    - 06/29/2021   Walked on RA x  3  lap(s) =  approx 750 ft @ mod fast pace, stop   Depression    Depression, major, single episode,  moderate (HCC) 03/19/2008   Qualifier: Diagnosis of   By: Minnette Amato  MD, Ronie Cohen        Dyspnea    Emphysema, unspecified (HCC)    per patient mild case   Epigastric pain 10/09/2022   Esophageal dysphagia 05/11/2023   Family history of adverse reaction to anesthesia    patient states sister has a hard time waking up from anesthesia.    GERD 03/19/2008   Qualifier: Diagnosis of   By: Minnette Amato  MD, Ronie Cohen        Grief reaction 01/24/2022   Hepatitis B core antibody positive 10/09/2022   Hepatitis C    Hepatitis C, chronic (HCC) 03/14/2015   Hiatal hernia 10/09/2022   History of hepatitis C 10/09/2022   Left upper lobe pulmonary nodule 02/04/2018   IMPRESSION:  1. Negative for acute pulmonary embolus or aortic  dissection  2. 14 mm left upper lobe pulmonary nodule, increased in size  compared to prior and therefore felt suspicious for carcinoma.  Consider correlation with PET-CT and/or tissue sampling.  3. Mild emphysema     Aortic Atherosclerosis (ICD10-I70.0) and Emphysema (ICD10-J43.9).        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    Vaccine counseling 11/13/2017   Allergies  Allergen Reactions   Aspirin Shortness Of Breath   Trelegy Ellipta  [Fluticasone -Umeclidin-Vilant] Shortness Of Breath    She reports that is made her cough worse.    Morphine Sulfate Other (See Comments)    RED STREAKS ON ARMS.   Oxycodone  Itching and Rash   Glycopyrrolate   Other (See Comments)    UNSPECIFIED REACTION  pt unsure if she is allergic.   Hydrocodone  Itching   Lactose Intolerance (Gi) Nausea And Vomiting    Milk only.Patient states that she can eat cheese.   Penicillins Other (See Comments)     Heartburn, upset stomach only & tolerated AMOXIL  after this reaction   Sulfa Antibiotics Tinitus   Social History   Socioeconomic History   Marital status: Widowed    Spouse name: Not on file   Number of children: Not on file   Years of education: Not on file   Highest education level: 12th grade  Occupational History   Not on file  Tobacco Use   Smoking status: Former    Current packs/day: 0.00    Average packs/day: 1 pack/day for 40.0 years (40.0 ttl pk-yrs)    Types: Cigarettes    Start date: 12/14/1977    Quit date: 12/14/2017    Years since quitting: 6.2    Passive exposure: Never   Smokeless tobacco: Never  Vaping Use   Vaping status: Never Used  Substance and Sexual Activity   Alcohol use: Not Currently    Comment: occasional bourbon   Drug use: Not Currently   Sexual activity: Never  Other Topics Concern   Not on file  Social History Narrative   Pt has roommate    Retired    Teacher, 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 (02/28/2024)   Overall Financial Resource Strain (CARDIA)    Difficulty of Paying Living Expenses: Somewhat hard  Food Insecurity: No Food Insecurity (02/28/2024)   Hunger Vital Sign    Worried About Running Out of Food in the Last Year: Never true    Ran Out of Food in the Last Year: Never true  Transportation Needs: No Transportation Needs (02/28/2024)   PRAPARE - Administrator, Civil Service (Medical): No    Lack of Transportation (Non-Medical): No  Physical Activity: Unknown (02/28/2024)   Exercise Vital Sign    Days of Exercise per Week: 0 days    Minutes of Exercise per Session: Not on  file  Stress: No Stress Concern Present (02/28/2024)   [employer] of  Occupational Health - Occupational Stress Questionnaire    Feeling of Stress : Only a little  Social Connections: Unknown (02/28/2024)   Social Connection and Isolation Panel [NHANES]    Frequency of Communication with Friends and Family: More than three times a week    Frequency of Social Gatherings with Friends and Family: Patient declined    Attends Religious Services: Patient declined    Database administrator or Organizations: No    Attends Banker Meetings: Never    Marital Status: Widowed    Vitals:   03/02/24 1439  BP: (!) 118/90  Pulse: 88  Resp: 20  Temp: 98.2 F (36.8 C)  SpO2: 97%   Body mass index is 27.88 kg/m.  Physical Exam Vitals and nursing note reviewed.  Constitutional:      General: She is not in acute distress.    Appearance: She is well-developed. She is not ill-appearing.  HENT:     Head: Atraumatic.     Right Ear: Tympanic membrane, ear canal and external ear normal.     Left Ear: Tympanic membrane, ear canal and external ear normal.     Nose: Rhinorrhea present.     Right Sinus: No maxillary sinus tenderness or frontal sinus tenderness.     Left Sinus: No maxillary sinus tenderness or frontal sinus tenderness.     Mouth/Throat:     Mouth: Mucous membranes are moist.     Pharynx: Oropharynx is clear. Uvula midline.  Eyes:     Conjunctiva/sclera: Conjunctivae normal.  Cardiovascular:     Rate and Rhythm: Normal rate and regular rhythm.     Heart sounds: No murmur heard. Pulmonary:     Effort: Pulmonary effort is normal. No respiratory distress.     Breath sounds: No stridor. Decreased breath sounds present. No wheezing, rhonchi or rales.     Comments: On continuous supplemental O2 2 LPM. Musculoskeletal:     Cervical back: No edema or erythema. No muscular tenderness.  Lymphadenopathy:     Cervical: No cervical adenopathy.  Skin:    General: Skin is warm.     Findings: No erythema or rash.  Neurological:     Mental Status:  She is alert and oriented to person, place, and time.  Psychiatric:        Mood and Affect: Mood and affect normal.   ASSESSMENT AND PLAN:  Ms. Asmus was seen today for COPD exacerbation.   Lab Results  Component Value Date   WBC 8.0 03/02/2024   HGB 14.4 03/02/2024   HCT 43.9 03/02/2024   MCV 81.3 03/02/2024   PLT 319.0 03/02/2024   SOB (shortness of breath) Chronic respiratory failure with hypoxia on continues O2 supplementation at 2 LPM. Problem worse for the past 3-4 weeks, stable. Completed 10 days treatment with Doxycycline  , started on 02/06/24. Lung auscultation negative except for diffuse hypoventilation. CXR and CBC ordered today. Instructed about warning signs.  -     DG Chest 2 View; Future -     CBC with Differential/Platelet; Future  COPD with exacerbation (HCC) Reports wheezing, none today. Agrees with Prednisone  for 3=5 days, instructed to take med with breakfast. Agrees with holding on abx. Follows with pulmonologist. Frequent exacerbations, waiting for PA for Dupixent. Continue Breztri  inh and albuterol  neb. Instructed about warning signs.  CXR I do not see infiltrates or lung consolidations. Costodiaphragmatic and costophrenic angles obscure R>L, ?  Small pleural effusion.  Emphysema.  -     DG Chest 2 View; Future -     CBC with Differential/Platelet; Future -     predniSONE ; Take 2 tablets (40 mg total) by mouth daily with breakfast for 5 days.  Dispense: 10 tablet; Refill: 0  Elevated blood pressure reading  DPB mildly elevated today. No hx of HTN. Follow with PCP.  Return in about 2 weeks (around 03/16/2024), or COPD and SOB with PCP.  I, Fritz Jewel Wierda, acting as a scribe for Latia Mataya [name], MD., have documented all relevant documentation on the behalf of Kiyoto Slomski [name], MD, as directed by  Vina Byrd [name], MD while in the presence of Starla Deller [name], MD.   I, Laker Thompson [name], MD, have reviewed all documentation for this visit. The documentation on  03/02/24 for the exam, diagnosis, procedures, and orders are all accurate and complete.  Shannah Conteh G. [name], MD  [facility]. [place] office.

## 2024-03-03 ENCOUNTER — Ambulatory Visit: Payer: Self-pay | Admitting: Family Medicine

## 2024-03-03 LAB — CBC WITH DIFFERENTIAL/PLATELET
Basophils Absolute: 0.1 10*3/uL (ref 0.0–0.1)
Basophils Relative: 1.4 % (ref 0.0–3.0)
Eosinophils Absolute: 0.3 10*3/uL (ref 0.0–0.7)
Eosinophils Relative: 3.9 % (ref 0.0–5.0)
HCT: 43.9 % (ref 36.0–46.0)
Hemoglobin: 14.4 g/dL (ref 12.0–15.0)
Lymphocytes Relative: 20.1 % (ref 12.0–46.0)
Lymphs Abs: 1.6 10*3/uL (ref 0.7–4.0)
MCHC: 32.7 g/dL (ref 30.0–36.0)
MCV: 81.3 fl (ref 78.0–100.0)
Monocytes Absolute: 0.7 10*3/uL (ref 0.1–1.0)
Monocytes Relative: 9.4 % (ref 3.0–12.0)
Neutro Abs: 5.2 10*3/uL (ref 1.4–7.7)
Neutrophils Relative %: 65.2 % (ref 43.0–77.0)
Platelets: 319 10*3/uL (ref 150.0–400.0)
RBC: 5.4 Mil/uL — ABNORMAL HIGH (ref 3.87–5.11)
RDW: 13.2 % (ref 11.5–15.5)
WBC: 8 10*3/uL (ref 4.0–10.5)

## 2024-03-13 ENCOUNTER — Ambulatory Visit: Admitting: Adult Health

## 2024-03-13 NOTE — Telephone Encounter (Signed)
 Per Alba Huddle, pt has been approved for PAP but still needs to contact [facility] pharmacy. Spoke with pt and she states that she HAS contacted them, however she has not yet scheduled shipment as she is having a heart procedure performed later this month and her cardiologist wants her to hold off on starting the medication until she is medically cleared. I requested that she please reach out to us  once she has been cleared and explained that [facility] would then reach out to her to schedule a New Start visit. She verbalized understanding to all.

## 2024-03-16 ENCOUNTER — Telehealth (HOSPITAL_COMMUNITY): Payer: Self-pay | Admitting: *Deleted

## 2024-03-16 NOTE — Telephone Encounter (Signed)
 Reaching out to patient to offer assistance regarding upcoming cardiac imaging study; pt verbalizes understanding of appt date/time, parking situation and where to check in, pre-test NPO status and medications ordered, and verified current allergies; name and call back number provided for further questions should they arise Johney Frame RN Navigator Cardiac Imaging [facility] [phone] office [phone] cell

## 2024-03-17 ENCOUNTER — Ambulatory Visit: Payer: Self-pay

## 2024-03-17 ENCOUNTER — Encounter (HOSPITAL_BASED_OUTPATIENT_CLINIC_OR_DEPARTMENT_OTHER): Payer: Self-pay

## 2024-03-17 ENCOUNTER — Ambulatory Visit (HOSPITAL_BASED_OUTPATIENT_CLINIC_OR_DEPARTMENT_OTHER)
Admission: RE | Admit: 2024-03-17 | Discharge: 2024-03-17 | Disposition: A | Discharge status: Home / Self Care | Source: Ambulatory Visit

## 2024-03-17 DIAGNOSIS — R072 Precordial pain: Secondary | ICD-10-CM

## 2024-03-17 DIAGNOSIS — R002 Palpitations: Secondary | ICD-10-CM | POA: Diagnosis not present

## 2024-03-17 MED ORDER — IOHEXOL 350 MG/ML SOLN
100.0000 mL | Freq: Once | INTRAVENOUS | Status: AC | PRN
  Administered 2024-03-17: 95 mL via INTRAVENOUS

## 2024-03-17 MED ORDER — DILTIAZEM HCL 25 MG/5ML IV SOLN
10.0000 mg | INTRAVENOUS | Status: DC | PRN
Start: 2024-03-17 — End: 2024-03-18

## 2024-03-17 MED ORDER — NITROGLYCERIN 0.4 MG SL SUBL
0.8000 mg | SUBLINGUAL_TABLET | Freq: Once | SUBLINGUAL | Status: AC
  Administered 2024-03-17: 0.8 mg via SUBLINGUAL

## 2024-03-17 MED ORDER — METOPROLOL TARTRATE 5 MG/5ML IV SOLN: 10.0000 mg | Freq: Once | INTRAVENOUS | Status: DC | PRN

## 2024-03-19 ENCOUNTER — Telehealth: Payer: Self-pay | Admitting: Family Medicine

## 2024-03-19 NOTE — Telephone Encounter (Signed)
-----   Message from Maurie Southern sent at 02/13/2024  5:29 PM EDT ----- Regarding: cologuard, schedule mammo w/ gyn? HM - cologuard, self schedule mammo dexa

## 2024-03-19 NOTE — Telephone Encounter (Signed)
 Called to check on status of cologuard and mammo (she had said would do with gyn and already had test at [facility]). Do not see results in system. LM asking her call office to let us  know status or to let us  know if needs any assistance in completing these.

## 2024-03-23 ENCOUNTER — Ambulatory Visit: Payer: Self-pay

## 2024-03-23 NOTE — Telephone Encounter (Signed)
 FYI Only or Action Required?: FYI only for provider.  Patient is followed in Pulmonology for COPD/adenocarcinoma, last seen on 01/09/2024 by Patricia Comer GAILS, NP. Called Nurse Triage reporting Breathing Problem. Symptoms began several months ago. Interventions attempted: Prescription medications: steroids, Maintenance inhaler, and Nebulizer treatments. Symptoms are: unchanged.  Triage Disposition: Call Specialist Now  Patient/caregiver understands and will follow disposition?: Yes      Copied from CRM [id number]. Topic: Clinical - Red Word Triage >> Mar 23, 2024 12:12 PM [name] wrote: Red Word that prompted transfer to Nurse Triage: Copd and coughing up mucus, increased shortness of breath and chest tightness. Reason for Disposition  Oxygen  level (e.g., pulse oximetry) 91 to 94 percent  Answer Assessment - Initial Assessment Questions E2C2 Pulmonary Triage - Initial Assessment Questions Chief Complaint (e.g., cough, sob, wheezing, fever, chills, sweat or additional symptoms) *Go to specific symptom protocol after initial questions. COPD flare, wheezing, productive green cough, chest tightness Recently finished prednisone  taper a few weeks ago by PCP, no abx per pt  How long have symptoms been present? Couple months  Have you tested for COVID or Flu? Note: If not, ask patient if a home test can be taken. If so, instruct patient to call back for positive results. No  MEDICINES:   Have you used any OTC meds to help with symptoms? Yes If yes, ask What medications? Mucinex   Have you used your inhalers/maintenance medication? Yes If yes, What medications? Albuterol  NEB - 3-4x/day - gives some relief - triager reviewed albuterol  usage Breztri  - 2 puffs twice  If inhaler, ask How many puffs and how often? Note: Review instructions on medication in the chart. See above  OXYGEN : Do you wear supplemental oxygen ? Yes If yes, How many liters are you supposed to use? 2L  PRN  Do you monitor your oxygen  levels? Yes If yes, What is your reading (oxygen  level) today? 93 on RA  What is your usual oxygen  saturation reading?  (Note: Pulmonary O2 sats should be 90% or greater) >93      1. RESPIRATORY STATUS: Describe your breathing? (e.g., wheezing, shortness of breath, unable to speak, severe coughing)      Wheezing, SOB 2. ONSET: When did this breathing problem begin?      X several months 3. PATTERN Does the difficult breathing come and go, or has it been constant since it started?      constant 4. SEVERITY: How bad is your breathing? (e.g., mild, moderate, severe)    - MILD: No SOB at rest, mild SOB with walking, speaks normally in sentences, can lie down, no retractions, pulse < 100.    - MODERATE: SOB at rest, SOB with minimal exertion and prefers to sit, cannot lie down flat, speaks in phrases, mild retractions, audible wheezing, pulse 100-120.    - SEVERE: Very SOB at rest, speaks in single words, struggling to breathe, sitting hunched forward, retractions, pulse > 120      Reports SOB/wheezing slightly above baseline Triager does appreciate audible SOB/wheezing during call. Pt is speaking in partial sentences - pt reports baseline.  5. RECURRENT SYMPTOM: Have you had difficulty breathing before? If Yes, ask: When was the last time? and What happened that time?      Yes, 6 mos ago 6. CARDIAC HISTORY: Do you have any history of heart disease? (e.g., heart attack, angina, bypass surgery, angioplasty)      denies 7. LUNG HISTORY: Do you have any history of lung disease?  (e.g., pulmonary  embolus, asthma, emphysema)     COPD 8. CAUSE: What do you think is causing the breathing problem?      COPD flare 9. OTHER SYMPTOMS: Do you have any other symptoms? (e.g., dizziness, runny nose, cough, chest pain, fever)     Chest tightness, productive cough 10. O2 SATURATION MONITOR:  Do you use an oxygen  saturation monitor (pulse  oximeter) at home? If Yes, ask: What is your reading (oxygen  level) today? What is your usual oxygen  saturation reading? (e.g., 95%)       See above 11. PREGNANCY: Is there any chance you are pregnant? When was your last menstrual period?       N/a 12. TRAVEL: Have you traveled out of the country in the last month? (e.g., travel history, exposures)       N/a  Protocols used: Breathing Difficulty-A-AH

## 2024-03-23 NOTE — Telephone Encounter (Signed)
 FYI Only or Action Required?: FYI only for provider.  Patient was last seen in primary care on 03/02/2024 by [name], [name], MD. Called Nurse Triage reporting Cough. Symptoms began several months ago. Interventions attempted: Prescription medications: O2, COPD medications. Symptoms are: gradually worsening.  Triage Disposition: Go to ED Now (Notify PCP)  Patient/caregiver understands and will follow disposition?: Yes             Copied from CRM [id number]. Topic: Clinical - Red Word Triage >> Mar 23, 2024 11:55 AM Gennette ORN wrote: Red Word that prompted transfer to Nurse Triage: Patient is having tightening in chest about 5 which she states is normal and she is having a shortness of breathe. She is also having a severe cough and green stuff is coming out. If she can rate 1-10 she would rate it as 8. Reason for Disposition  [1] MODERATE difficulty breathing (e.Archer., speaks in phrases, SOB even at rest, pulse 100-120) AND [2] still present when not coughing  Answer Assessment - Initial Assessment Questions 1. ONSET: When did the cough begin?      A couple months ago 2. SEVERITY: How bad is the cough today?      severe 3. SPUTUM: Describe the color of your sputum (none, dry cough; clear, white, yellow, green)     green 4. HEMOPTYSIS: Are you coughing up any blood? If so ask: How much? (flecks, streaks, tablespoons, etc.)     no 5. DIFFICULTY BREATHING: Are you having difficulty breathing? If Yes, ask: How bad is it? (e.Archer., mild, moderate, severe)    - MILD: No SOB at rest, mild SOB with walking, speaks normally in sentences, can lie down, no retractions, pulse < 100.    - MODERATE: SOB at rest, SOB with minimal exertion and prefers to sit, cannot lie down flat, speaks in phrases, mild retractions, audible wheezing, pulse 100-120.    - SEVERE: Very SOB at rest, speaks in single words, struggling to breathe, sitting hunched forward, retractions, pulse > 120      mod 6.  FEVER: Do you have a fever? If Yes, ask: What is your temperature, how was it measured, and when did it start?     no 7. CARDIAC HISTORY: Do you have any history of heart disease? (e.Archer., heart attack, congestive heart failure)      no 8. LUNG HISTORY: Do you have any history of lung disease?  (e.Archer., pulmonary embolus, asthma, emphysema)     copd  Protocols used: Cough - Chronic-A-AH

## 2024-03-23 NOTE — Telephone Encounter (Signed)
 Pt scheduled to see Dr Theophilus tomorrow. NFN

## 2024-03-23 NOTE — Telephone Encounter (Signed)
 Additional Information . Commented on: Answer Assessment    Triager appreciates SOB at baseline per pt. Pt scheduled with alternate [facility] provider.  Protocols used: Breathing Difficulty-A-AH

## 2024-03-24 ENCOUNTER — Ambulatory Visit: Admitting: Pulmonary Disease

## 2024-03-24 NOTE — Telephone Encounter (Signed)
 Called pt to check on her status but no answer. Will try again soon.

## 2024-03-25 ENCOUNTER — Ambulatory Visit (INDEPENDENT_AMBULATORY_CARE_PROVIDER_SITE_OTHER): Admitting: Pulmonary Disease

## 2024-03-25 ENCOUNTER — Encounter: Payer: Self-pay | Admitting: Pulmonary Disease

## 2024-03-25 VITALS — BP 100/60 | HR 89 | Ht 64.0 in | Wt 159.2 lb

## 2024-03-25 DIAGNOSIS — J441 Chronic obstructive pulmonary disease with (acute) exacerbation: Secondary | ICD-10-CM | POA: Diagnosis not present

## 2024-03-25 MED ORDER — PREDNISONE 10 MG PO TABS: 60.0000 mg | ORAL_TABLET | Freq: Every day | ORAL | 0 refills | Status: DC

## 2024-03-25 MED ORDER — AZITHROMYCIN 250 MG PO TABS: ORAL_TABLET | ORAL | 0 refills | Status: DC

## 2024-03-25 NOTE — Patient Instructions (Addendum)
 VISIT SUMMARY:  During today's visit, we discussed your worsening respiratory symptoms related to your severe COPD. You reported increased shortness of breath, persistent cough with green sputum, wheezing, dizziness, and feeling hot. We also reviewed your past medical history, including your lung cancer treatments and mitral valve disorder.  YOUR PLAN:  -SEVERE COPD WITH EXACERBATION: Chronic Obstructive Pulmonary Disease (COPD) is a long-term lung condition that makes it hard to breathe. Your symptoms have worsened recently, so we are prescribing azithromycin  (Z-Pak) and a 12-day course of prednisone . We are also coordinating with your cardiologist to get approval for starting Dupixent, a medication that may help manage your symptoms. If Dupixent is not approved, we will consider alternative treatments.  INSTRUCTIONS:  Please take the azithromycin  (Z-Pak) and prednisone  as prescribed. Follow up with your cardiologist for approval of Dupixent. If you experience any worsening symptoms or have concerns, please contact our office.

## 2024-03-25 NOTE — Progress Notes (Signed)
 [name]    [id number]    April 01, 1956  Primary Care Physician:Nafziger, Darleene, NP  Referring Physician: Merna Darleene, NP [address] [place],  [place] [address]  Chief complaint: Acute visit for COPD exacerbation  HPI: 68 y.o. 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), Bronchitis, CAP (community acquired pneumonia) (10/01/2023), Chest pain (10/09/2022), Chronic bronchitis (HCC) (02/04/2018), Chronic respiratory failure with hypoxia (HCC) (10/06/2021), Chronic rhinitis (10/06/2021), Cirrhosis (HCC) (11/13/2017), Colon cancer screening (10/09/2022), Community acquired pneumonia (10/03/2023), COPD GOLD 3  (06/29/2021), Depression, Depression, major, single episode, moderate (HCC) (03/19/2008), Dyspnea, Emphysema, unspecified (HCC), Epigastric pain (10/09/2022), Esophageal dysphagia (05/11/2023), Family history of adverse reaction to anesthesia, GERD (03/19/2008), Grief reaction (01/24/2022), 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), Hepatitis C, Hepatitis C, chronic (HCC) (03/14/2015), Hiatal hernia (10/09/2022), History of hepatitis C (10/09/2022), Left upper lobe pulmonary nodule (02/04/2018), lung ca (dx'd 03/2018), Lung mass (03/13/2018), Migraine headache (08/01/2010), NEPHROLITHIASIS, HX OF (03/19/2008), Nonrheumatic mitral (valve) stenosis, Pill dysphagia (10/09/2022), Pleural effusion (10/06/2021), S/P partial lobectomy of lung (12/18/2019), SOB (shortness of breath) (09/18/2021), Tobacco use disorder (01/21/2014), Tremor (10/14/2012), Tremor, unspecified, and Vaccine counseling (11/13/2017).  Discussed the use of AI scribe software for clinical note transcription with the patient,  who gave verbal consent to proceed.  History of Present Illness [name] is a 68 year old female with severe COPD who presents with worsening respiratory symptoms.  She is a patient of Dr. Darlean  Over the past couple of months, she has experienced worsening respiratory symptoms, including shortness of breath with minimal activity such as rolling over in bed. Her symptoms include a persistent cough with green sputum, wheezing, dizziness, and feeling hot, though she denies any recent fever.  She was previously treated with a five-day course of prednisone  prescribed by her primary care physician, which she feels was insufficient as she typically requires a seven to ten-day course. Despite using her nebulizer with albuterol  full-time, her symptoms have not resolved. She is currently on Breztri  and Ventolin  for COPD management.  At last pulmonary visit earlier this year she was prescribed Dupixent, but her cardiologist has scheduled echocardiogram before approving this due to concerns about potential cardiac side effects, given her history of a sluggish mitral valve and oxygen  saturation issues.  Her past medical history is significant for lung cancer, for which she underwent lobectomies in both lungs in 2019 and 2021. She recalls a prolonged episode of pneumonia that lasted four months before being properly diagnosed, during which she required four liters of oxygen  to maintain adequate saturation levels.  Socially, her condition impacts her ability to engage in activities with her family, such as going to the beach with her granddaughter. She has also participated in pulmonary rehabilitation but finds it challenging due to her need to frequently rest.  Outpatient Encounter Medications as of 03/25/2024  Medication Sig   albuterol  (PROVENTIL ) (2.5 MG/3ML) 0.083% nebulizer solution Take 3 mLs (2.5 mg total) by nebulization every 4 (four) hours as needed for wheezing or shortness of breath.    albuterol  (VENTOLIN  HFA) 108 (90 Base) MCG/ACT inhaler Inhale 2 puffs into the lungs every 6 (six) hours as needed for wheezing or shortness of breath.   Budeson-Glycopyrrol-Formoterol  (BREZTRI  AEROSPHERE) 160-9-4.8 MCG/ACT AERO Inhale 2  puffs into the lungs 2 (two) times daily. (Patient taking differently: Inhale 1 puff into the lungs 2 (two) times daily.)   clonazePAM  (KLONOPIN ) 0.5 MG tablet Take 1 tablet (0.5 mg total) by mouth 3 (three) times daily as needed. for anxiety   esomeprazole  (NEXIUM ) 40 MG capsule Take 1 capsule (40 mg total) by mouth daily. (Patient taking differently: Take 40 mg by mouth in the morning and at bedtime.)   guaiFENesin  (MUCINEX ) 600 MG 12 hr tablet Take 600 mg by mouth daily.   hydrOXYzine  (VISTARIL ) 25 MG capsule Take 1 capsule (25 mg total) by mouth every 8 (eight) hours as needed.   metoprolol  tartrate (LOPRESSOR ) 100 MG tablet Take 1 tablet (100 mg total) by mouth once for 1 dose. Please take this medication 2 hours before CT   PARoxetine  (PAXIL ) 40 MG tablet TAKE 1 TABLET BY MOUTH EVERY DAY   propranolol  (INDERAL ) 20 MG tablet Take 1 capsule (60 mg total) by mouth daily.   SUMAtriptan  (IMITREX ) 50 MG tablet TAKE 1 TAB BY MOUTH AT ONSET OF MIGRAINE. IF SYMTPOMS PERSIST, A SECOND DOSE MAY BE TAKEN IN 2 HOURS. NOT TO EXCEED 2 DOSE SIN A 24 HR PERIOD (Patient not taking: Reported on 03/02/2024)   topiramate  (TOPAMAX ) 25 MG tablet TAKE 1 TABLET BY MOUTH TWICE A DAY (Patient not taking: Reported on 03/02/2024)   traZODone  (DESYREL ) 100 MG tablet TAKE 1 TABLET (100 MG TOTAL) BY MOUTH AT BEDTIME AS NEEDED. FOR SLEEP. NEED PHYSICAL EXAM FOR FURTHER REFILLS   No facility-administered encounter medications on file as of 03/25/2024.    Physical Exam: Blood pressure 100/60, pulse 89, height 5' 4 (1.626 m), weight 159 lb 3.2 oz (72.2 kg), SpO2 97%. Gen:      No acute distress HEENT:  EOMI, sclera anicteric Neck:     No masses; no thyromegaly Lungs:   Bilateral expiratory  wheeze, crackles CV:         Regular rate and rhythm; no murmurs Abd:      + bowel sounds; soft, non-tender; no palpable masses, no distension Ext:    No edema; adequate peripheral perfusion Skin:      Warm and dry; no rash Neuro: alert and oriented x 3 Psych: normal mood and affect  Data Reviewed: Imaging: CT chest 11/28/2023-clearing of right upper lobe airspace disease, emphysema, postsurgical changes and scarring in both lungs Chest x-ray 03/07/2024-no active cardiopulmonary disease CT coronary 03/17/2024-visualized lungs show emphysema, stable chronic changes with no active process I reviewed the images personally.  PFTs: 12/14/2019-severe obstructive airway disease, severe diffusion defect Assessment & Plan Severe COPD with exacerbation Severe COPD exacerbation characterized by dyspnea, wheezing, productive cough with green sputum, and dizziness. Symptoms persist despite a 5-day prednisone  course. Currently on Breztri  and Ventolin . Dupixent therapy pending cardiologist approval due to risk of thromboembolic events and cardiac complications. Symptoms limit daily activities.  - Prescribe azithromycin  (Z-Pak) - Prescribe prednisone  for 12 days - Awaiting cardiology clearance prior to Dupixent initiation - Consider alternative therapy with Ohtuvayre  if Dupixent is not approved   Recommendations: Z-Pak, prolonged prednisone  taper Continue Breztri , albuterol  Initiate Dupixent after cardiology workup.  Consider Ohtuvayre  as alternative  Odeth Bry MD [facility] 03/25/2024, 3:45 PM  CC: Merna Huxley, NP

## 2024-03-25 NOTE — Telephone Encounter (Signed)
 Called pt and she stated that she is going to pulmonology today. No further action needed.

## 2024-03-26 ENCOUNTER — Ambulatory Visit (HOSPITAL_BASED_OUTPATIENT_CLINIC_OR_DEPARTMENT_OTHER)

## 2024-03-27 ENCOUNTER — Telehealth: Payer: Self-pay | Admitting: Pharmacist

## 2024-03-27 NOTE — Telephone Encounter (Addendum)
 ATC patient regarding Dupixent new start. Unable to reach. Left VM with callback number  ----- Message from [facility] sent at 03/27/2024  2:49 PM EDT ----- Thanks Sherry and Alwin- Can we get her scheduled for Dupixent initiation? ----- Message ----- From: Liborio Alean SAUNDERS, MD Sent: 03/25/2024   6:12 PM EDT To: Ozell KATHEE America, MD; Lonna Coder, MD; Lisa#  No significant obstructive coronary artery disease on cardiac CT. No significant sustained arrhythmias on heart monitor. Echocardiogram pending but prior echocardiogram from January showed no major problems other than moderate mitral stenosis and moderate pulmonary hypertension. Has repeat echocardiogram pending tomorrow. Okay to proceed with Dupixent. Thank you. ----- Message ----- From: Mannam, Praveen, MD Sent: 03/25/2024   5:33 PM EDT To: Ozell KATHEE America, MD; Alean SAUNDERS Madireddy, MD  Hello, I saw her in clinic today for acute COPD exacerbation. Will initiate Dupixent once cleared after cardiology studies.  We can consider Ohtuvayre  as an alternative but it has cardiac side effects to of tachycardia and palpitations.  Thanks: Praveen

## 2024-03-27 NOTE — Telephone Encounter (Signed)
 ATC patient regarding Dupixent new start. Unable to reach. Left VM with callback number  ----- Message from [facility] sent at 03/27/2024  2:49 PM EDT ----- Thanks Sherry and Alwin- Can we get her scheduled for Dupixent initiation? ----- Message ----- From: Liborio Alean SAUNDERS, MD Sent: 03/25/2024   6:12 PM EDT To: Ozell KATHEE America, MD; Lonna Coder, MD; Lisa#  No significant obstructive coronary artery disease on cardiac CT. No significant sustained arrhythmias on heart monitor. Echocardiogram pending but prior echocardiogram from January showed no major problems other than moderate mitral stenosis and moderate pulmonary hypertension. Has repeat echocardiogram pending tomorrow. Okay to proceed with Dupixent. Thank you. ----- Message ----- From: Mannam, Praveen, MD Sent: 03/25/2024   5:33 PM EDT To: Ozell KATHEE America, MD; Alean SAUNDERS Madireddy, MD  Hello, I saw her in clinic today for acute COPD exacerbation. Will initiate Dupixent once cleared after cardiology studies.  We can consider Ohtuvayre  as an alternative but it has cardiac side effects to of tachycardia and palpitations.  Thanks: Praveen

## 2024-03-30 ENCOUNTER — Telehealth: Payer: Self-pay

## 2024-03-30 ENCOUNTER — Ambulatory Visit: Admitting: Primary Care

## 2024-03-30 NOTE — Telephone Encounter (Signed)
 I have sent the results to Dr. Theophilus (pulmonology).  Left VM and MyChart message sent to pt.

## 2024-03-30 NOTE — Telephone Encounter (Signed)
 Diane from [facility] is calling to inform us  that an addendum was added to the CT Coronary Morph and requested that the provider review this information asap.

## 2024-04-20 ENCOUNTER — Ambulatory Visit

## 2024-04-21 ENCOUNTER — Encounter: Payer: Self-pay | Admitting: Family Medicine

## 2024-04-21 NOTE — Telephone Encounter (Signed)
 Spoke with patient - she was under the assumption that cardiologist had cleared Ohtuvayre  not Dupixent. Advised that as of 03/25/2024, Dr. Liborio had cleared her for Dupixent.  Scheduled for Dupixent new start on 05/05/2024. Will use sample  Zoey Gilkeson, PharmD, MPH, BCPS, CPP Clinical Pharmacist (Rheumatology and Pulmonology)

## 2024-04-24 ENCOUNTER — Ambulatory Visit (HOSPITAL_COMMUNITY)

## 2024-04-24 ENCOUNTER — Encounter (HOSPITAL_COMMUNITY): Payer: Self-pay

## 2024-04-28 NOTE — Progress Notes (Signed)
 HPI Patient presents today to [facility] to see pharmacy team for Dupixent  new start.  Accompanied by her sister. Past medical history includes asthma-COPD overlap, chronic bronchitis, allergic rhintiis, history of adenocarcinoma of lung, history of HCV, GERD  Respiratory Medications Current regimen: Breztri  160-9-4.24mcg (2 puffs twice daily) Patient reports no known adherence challenges  OBJECTIVE Allergies  Allergen Reactions   Aspirin Shortness Of Breath   Trelegy Ellipta  [Fluticasone -Umeclidin-Vilant] Shortness Of Breath    She reports that is made her cough worse.    Morphine Sulfate Other (See Comments)    RED STREAKS ON ARMS.   Oxycodone  Itching and Rash   Glycopyrrolate  Other (See Comments)    UNSPECIFIED REACTION  pt unsure if she is allergic.   Hydrocodone  Itching   Lactose Intolerance (Gi) Nausea And Vomiting    Milk only.Patient states that she can eat cheese.   Penicillins Other (See Comments)     Heartburn, upset stomach only & tolerated AMOXIL  after this reaction   Sulfa Antibiotics Tinitus    Outpatient Encounter Medications as of 05/05/2024  Medication Sig   albuterol  (PROVENTIL ) (2.5 MG/3ML) 0.083% nebulizer solution Take 3 mLs (2.5 mg total) by nebulization every 4 (four) hours as needed for wheezing or shortness of breath.   albuterol  (VENTOLIN  HFA) 108 (90 Base) MCG/ACT inhaler Inhale 2 puffs into the lungs every 6 (six) hours as needed for wheezing or shortness of breath.   Budeson-Glycopyrrol-Formoterol  (BREZTRI  AEROSPHERE) 160-9-4.8 MCG/ACT AERO Inhale 2 puffs into the lungs 2 (two) times daily. (Patient taking differently: Inhale 1 puff into the lungs 2 (two) times daily.)   clonazePAM  (KLONOPIN ) 0.5 MG tablet Take 1 tablet (0.5 mg total) by mouth 3 (three) times daily as needed. for anxiety   esomeprazole  (NEXIUM ) 40 MG capsule Take 1 capsule (40 mg total) by mouth daily. (Patient taking differently: Take 40 mg by mouth in the morning and at  bedtime.)   guaiFENesin  (MUCINEX ) 600 MG 12 hr tablet Take 600 mg by mouth daily.   hydrOXYzine  (VISTARIL ) 25 MG capsule Take 1 capsule (25 mg total) by mouth every 8 (eight) hours as needed.   metoprolol  tartrate (LOPRESSOR ) 100 MG tablet Take 1 tablet (100 mg total) by mouth once for 1 dose. Please take this medication 2 hours before CT   PARoxetine  (PAXIL ) 40 MG tablet TAKE 1 TABLET BY MOUTH EVERY DAY   propranolol  (INDERAL ) 20 MG tablet Take 1 capsule (60 mg total) by mouth daily.   SUMAtriptan  (IMITREX ) 50 MG tablet TAKE 1 TAB BY MOUTH AT ONSET OF MIGRAINE. IF SYMTPOMS PERSIST, A SECOND DOSE MAY BE TAKEN IN 2 HOURS. NOT TO EXCEED 2 DOSE SIN A 24 HR PERIOD (Patient not taking: Reported on 03/02/2024)   topiramate  (TOPAMAX ) 25 MG tablet TAKE 1 TABLET BY MOUTH TWICE A DAY (Patient not taking: Reported on 03/02/2024)   traZODone  (DESYREL ) 100 MG tablet TAKE 1 TABLET (100 MG TOTAL) BY MOUTH AT BEDTIME AS NEEDED. FOR SLEEP. NEED PHYSICAL EXAM FOR FURTHER REFILLS   [DISCONTINUED] azithromycin  (ZITHROMAX ) 250 MG tablet Take 500 mg on day 1 and then 250 mg/day for the next 4 days   [DISCONTINUED] predniSONE  (DELTASONE ) 10 MG tablet Take 6 tablets (60 mg total) by mouth daily with breakfast. Take 60 mg/day on day 1 and reduce dose by 10 mg every 2 days until taper is complete   No facility-administered encounter medications on file as of 05/05/2024.     Immunization History  Administered Date(s) Administered   Fluad  Quad(high Dose 65+) 06/29/2021, 10/01/2022   Influenza Split 07/29/2012   Influenza,inj,Quad PF,6+ Mos 06/27/2019, 07/14/2020   Influenza-Unspecified 07/01/2017, 07/09/2017, 07/09/2018   Moderna Sars-Covid-2 Vaccination 01/18/2020, 02/17/2020   Pneumococcal Conjugate-13 09/04/2019   Pneumococcal Polysaccharide-23 11/13/2017   Tdap 01/21/2014     PFTs    Latest Ref Rng & Units 12/14/2019    9:20 AM 03/11/2018    7:47 AM  PFT Results  FVC-Pre L 2.01  1.86   FVC-Predicted Pre % 60  55    FVC-Post L 2.63  2.47   FVC-Predicted Post % 79  73   Pre FEV1/FVC % % 42  52   Post FEV1/FCV % % 39  52   FEV1-Pre L 0.85  0.97   FEV1-Predicted Pre % 33  37   FEV1-Post L 1.02  1.28   DLCO uncorrected ml/min/mmHg 10.06  10.99   DLCO UNC% % 48  42   DLCO corrected ml/min/mmHg 9.24  10.54   DLCO COR %Predicted % 44  41   DLVA Predicted % 55  54   TLC L 5.78  5.37   TLC % Predicted % 111  103   RV % Predicted % 168  144     Eosinophils Most recent blood eosinophil count was 300 cells/microL taken on 03/02/2024.   IgE: 31 on 10/27/2021  Assessment   Biologics training for dupilumab  (Dupixent )  Goals of therapy: Mechanism: human monoclonal IgG4 antibody that inhibits interleukin-4 and interleukin-13 cytokine-induced responses, including release of proinflammatory cytokines, chemokines, and IgE Reviewed that Dupixent  is add-on medication and patient must continue maintenance inhaler regimen. Response to therapy: may take 4 months to determine efficacy. Discussed that patients generally feel improvement sooner than 4 months.  Side effects: injection site reaction (6-18%), antibody development (5-16%), ophthalmic conjunctivitis (2-16%), transient blood eosinophilia (1-2%)  Dose: 600mg  at Week 0 (administered today in clinic) followed by 300mg  every 14 days thereafter  Administration/Storage:  Reviewed administration sites of thigh or abdomen (at least 2-3 inches away from abdomen). Reviewed the upper arm is only appropriate if caregiver is administering injection  Do not shake pen/syringe as this could lead to product foaming or precipitation. Do not use if solution is discolored or contains particulate matter or if window on prefilled pen is yellow (indicates pen has been used).  Reviewed storage of medication in refrigerator. Reviewed that Dupixent  can be stored at room temperature in unopened carton for up to 14 days.  Access: Approval of Dupixent  through: patient  assistance  Provider administered Dupixent  300mg /37ml in left upper thigh and patient self-administered in right upper thigh using sample (TOTAL DOSE 600mg ) Dupixent  300mg /71mL autoinjector pen NDC: [phone] Lot: [id number] Expiration: 09/30/2025  Patient monitored for 30 minutes for adverse reaction.  Patient tolerated well.  Injection site noted. Patient denies itchiness and irritation at injection., No swelling or redness noted., and Reviewed injection site reaction management with patient verbally and printed information for review in AVS  Medication Reconciliation  A drug regimen assessment was performed, including review of allergies, interactions, disease-state management, dosing and immunization history. Medications were reviewed with the patient, including name, instructions, indication, goals of therapy, potential side effects, importance of adherence, and safe use.  Drug interaction(s): none noted  PLAN Continue Dupixent  300mg  every 14 days.  Next dose is due 05/19/2024 and every 14 days thereafter. Rx sent to: [facility]: [id number].  Patient has received shipment at home  Continue maintenance inhaler regimen of: Breztri  160-9-4.8mcg (2 puffs twice daily)  All  questions encouraged and answered.  Instructed patient to reach out with any further questions or concerns.  Thank you for allowing pharmacy to participate in this patient's care.  This appointment required 45 minutes of patient care (this includes precharting, chart review, review of results, face-to-face care, etc.).   Sherry Pennant, PharmD, MPH, BCPS, CPP Clinical Pharmacist (Rheumatology and Pulmonology)

## 2024-05-02 NOTE — Progress Notes (Unsigned)
 [name], female    DOB: 17-Jan-1956,    MRN: [id number]   Brief patient profile:  32 yowf  quit smoking 2019 p started albuterol   inhalers around 2014 then added symbicort  changed to wixella  due to cost referred to pulmonary clinic 06/29/2021 by Dr Koberlein  who recently changed to breztri  which seemed to helped even more.   PFTs 12/14/19 c/w GOLD 3 copd   04/12/21 oncology note DIAGNOSIS:  1) Stage Ib (T2, N0, M0) non-small cell lung cancer, adenocarcinoma diagnosed in June 2019. 2) stage Ia (T1 a, N0, M0) non-small cell lung cancer, adenocarcinoma status post   PRIOR THERAPY:  1) status post wedge resection of the left upper lobe nodule in addition to lingular segmentectomy and mediastinal lymph node sampling under the care of Dr. Kerrin on March 13, 2018.  Right lower lobe segmentectomy under the care of Dr. Kerrin on December 18, 2019.   CURRENT THERAPY: Observation  History of Present Illness  06/29/2021  Pulmonary/ 1st office eval/Cheray Pardi  Chief Complaint  Patient presents with   Pulmonary Consult     Referred by Dr. Junell Kuberlein. Pt has hx of lung ca and she was told back in 2018 she has COPD. She gets winded just walking around her house. She had PNA approx 2 months.    Dyspnea:  one room to the next / shops on line  Cough: prednisone  helped still some residual dry cough  Sleep: on side / electric bed 30 degrees  SABA use: one bid hfa/ neb once daily  Rec Plan A = Automatic = Always=    wixella one twice daily  = trelegy 100 once daily  or Breztri  2 puff every 12 hours  Breathe out thru nose  Plan B = Backup (to supplement plan A, not to replace it) Only use your albuterol  inhaler as a rescue medication  Plan C = Crisis (instead of Plan B but only if Plan B stops working) - only use your albuterol  nebulizer if you first try Plan B and it fails to help Call we the cheapest alternatives  for example wixellla / incruse   = trelegy      08/14/2022  f/u ov/Armanie Ullmer  re: gold 3 maint on breztri   / could not tol bisoprolol  due to shaking but helped breathing  Chief Complaint  Patient presents with   Follow-up    Breathing has improved slightly. She has not used her albuterol  inhaler in the past wk. She has non prod cough.    Dyspnea:  around the house  Cough: sometimes with coughing  Sleeping: 6 in bed blocks on side  SABA use: rarely needing hfa/ much less neb  02 has it, not using,sats usually > 90% walking on RA though very sedentary they fired me from rehab Rec Please remember to go to the lab department   for your tests - we will call you with the results when they are available. Work on inhaler technique:  My office will be contacting you by phone for referral to pulmonary rehab> done - says failed though able to bicycle fine     02/12/2023  f/u ov/Iran Kievit re: GOLD 3 copd maint on breztri    Chief Complaint  Patient presents with   Follow-up    Cough and SOB with exertion persistent.  Dyspnea:  walking to Bathroom and back is about her limit  Cough: not productive  Sleeping:30 degrees electric bed  SABA use: albuterol  before ex made no difference /  02: not using at all Recs Make sure you check your oxygen  saturation  AT  your highest level of activity (not after you stop)   to be sure it stays over 90%   Pace yourself aiming for 30 min of sustained exercise at a level where you are short of breath but not out of breath. Ask your neurologist about weaning off Inderal  Work on inhaler technique:   04/24/23 Aecopd > rx doxy/ pred by [facility] [place]    08/28/2023 64m  f/u ov/Roselie Cirigliano re: GOLD 3 copd   maint on breztri    Chief Complaint  Patient presents with   Follow-up    SOB, cough, wheeze x 2 weeks.  Dyspnea:  room to room  Cough: mucus is white  Sleeping: 30 degrees electric  resp cc  SABA use: up to once or twice daily  hfa/ neb not today  02: none  Lung cancer screening :  07/15/23   Rec Prednisone  10 mg take  4 each am x 2 days,   2 each am x  2 days,  1 each am x 2 days and stop Plan A = Automatic = Always=    Breztri    Work on inhaler technique:  Plan B = Backup (to supplement plan A, not to replace it) Only use your albuterol  inhaler as a rescue medication Plan C = Crisis (instead of Plan B but only if Plan B stops working) - only use your albuterol  nebulizer if you first try Plan B  Also  Ok to try albuterol  15 min before an activity (on alternating days)  that you know would usually make you short of breath     Please schedule a follow up visit in 3 months but call sooner if needed     05/05/2024  f/u ov/Sophiana Milanese re: COPD GOLD 3/ now 02 dep    maint on BREZTRI    Chief Complaint  Patient presents with   COPD    Sob has worsened and dry cough green/white mucus.  Dyspnea:  room to room at home out of breath  Cough: mostly in am's all colors  Sleeping: 30 degrees hob with one pillow  resp cc  SABA use: just using the neb each am  02: 2lpm does not titate / has POC but not working and on back up tank  Lung cancer screening :  per dr Emery     No obvious day to day or daytime variability or assoc excess/ purulent sputum or mucus plugs or hemoptysis or cp or chest tightness, subjective wheeze or overt sinus or hb symptoms.    Also denies any obvious fluctuation of symptoms with weather or environmental changes or other aggravating or alleviating factors except as outlined above   No unusual exposure hx or h/o childhood pna/ asthma or knowledge of premature birth.  Current Allergies, Complete Past Medical History, Past Surgical History, Family History, and Social History were reviewed in [employer] record.  ROS  The following are not active complaints unless bolded Hoarseness, sore throat, dysphagia, dental problems, itching, sneezing,  nasal congestion or discharge of excess mucus or purulent secretions, ear ache,   fever, chills, sweats, unintended wt loss or wt gain, classically pleuritic or  exertional cp,  orthopnea pnd or arm/hand swelling  or leg swelling, presyncope, palpitations, abdominal pain, anorexia, nausea, vomiting, diarrhea  or change in bowel habits or change in bladder habits, change in stools or change in urine, dysuria, hematuria,  rash, arthralgias, visual complaints, headache,  numbness, weakness or ataxia or problems with walking or coordination,  change in mood or  memory.        Current Meds  Medication Sig   albuterol  (PROVENTIL ) (2.5 MG/3ML) 0.083% nebulizer solution Take 3 mLs (2.5 mg total) by nebulization every 4 (four) hours as needed for wheezing or shortness of breath.   albuterol  (VENTOLIN  HFA) 108 (90 Base) MCG/ACT inhaler Inhale 2 puffs into the lungs every 6 (six) hours as needed for wheezing or shortness of breath.   Budeson-Glycopyrrol-Formoterol  (BREZTRI  AEROSPHERE) 160-9-4.8 MCG/ACT AERO Inhale 2 puffs into the lungs 2 (two) times daily. (Patient taking differently: Inhale 1 puff into the lungs 2 (two) times daily.)   clonazePAM  (KLONOPIN ) 0.5 MG tablet Take 1 tablet (0.5 mg total) by mouth 3 (three) times daily as needed. for anxiety   Dupilumab  (DUPIXENT ) 300 MG/2ML SOAJ Inject 300 mg into the skin every 14 (fourteen) days.   esomeprazole  (NEXIUM ) 40 MG capsule Take 1 capsule (40 mg total) by mouth daily. (Patient taking differently: Take 40 mg by mouth in the morning and at bedtime.)   guaiFENesin  (MUCINEX ) 600 MG 12 hr tablet Take 600 mg by mouth daily.   hydrOXYzine  (VISTARIL ) 25 MG capsule Take 1 capsule (25 mg total) by mouth every 8 (eight) hours as needed.   PARoxetine  (PAXIL ) 40 MG tablet TAKE 1 TABLET BY MOUTH EVERY DAY   propranolol  (INDERAL ) 20 MG tablet Take 1 capsule (60 mg total) by mouth daily.   traZODone  (DESYREL ) 100 MG tablet TAKE 1 TABLET (100 MG TOTAL) BY MOUTH AT BEDTIME AS NEEDED. FOR SLEEP. 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    Hepatitis C    lung ca dx'd 03/2018   Tremor, unspecified    non-specific; takes inderal .          Objective:     Wts  05/05/2024          160   08/28/2023     160  02/12/2023       156  08/14/2022      150  04/23/2022       156   03/07/2022        156   09/07/21 125 lb (56.7 kg)  06/29/21 120 lb (54.4 kg)  06/07/21 119 lb 11.2 oz (54.3 kg)   Vital signs reviewed  05/05/2024  - Note at rest 02 sats  94% on RA   General appearance:    w/c bound elderly wf nad     HEENT : Oropharynx  clear  Nasal turbinates nl    NECK :  without  apparent JVD/ palpable Nodes/TM    LUNGS: no acc muscle use,  Mild barrel/ mild kyphotic   contour chest wall with bilateral  Distant bs s audible wheeze and  without cough on insp or exp maneuvers  and mild  Hyperresonant  to  percussion bilaterally     CV:  RRR  no s3 or murmur or increase in P2, and no edema   ABD:  soft and nontender with pos end  insp Hoover's  in the supine position.  No bruits or organomegaly appreciated   MS:  Nl gait/ ext warm without deformities Or obvious joint restrictions  calf tenderness, cyanosis or clubbing     SKIN: warm and dry without lesions    NEURO:  alert, approp, nl sensorium with  no motor or cerebellar deficits apparent.               Assessment

## 2024-05-04 ENCOUNTER — Encounter: Payer: Self-pay | Admitting: Internal Medicine

## 2024-05-05 ENCOUNTER — Encounter: Payer: Self-pay | Admitting: Internal Medicine

## 2024-05-05 ENCOUNTER — Ambulatory Visit: Admitting: Pharmacist

## 2024-05-05 ENCOUNTER — Ambulatory Visit (INDEPENDENT_AMBULATORY_CARE_PROVIDER_SITE_OTHER): Admitting: Internal Medicine

## 2024-05-05 VITALS — BP 110/82 | HR 96 | Ht 64.0 in | Wt 160.0 lb

## 2024-05-05 DIAGNOSIS — J4489 Other specified chronic obstructive pulmonary disease: Secondary | ICD-10-CM

## 2024-05-05 DIAGNOSIS — J449 Chronic obstructive pulmonary disease, unspecified: Secondary | ICD-10-CM

## 2024-05-05 DIAGNOSIS — J9611 Chronic respiratory failure with hypoxia: Secondary | ICD-10-CM

## 2024-05-05 DIAGNOSIS — Z7189 Other specified counseling: Secondary | ICD-10-CM

## 2024-05-05 DIAGNOSIS — R059 Cough, unspecified: Secondary | ICD-10-CM

## 2024-05-05 MED ORDER — ALBUTEROL SULFATE HFA 108 (90 BASE) MCG/ACT IN AERS
2.0000 | INHALATION_SPRAY | RESPIRATORY_TRACT | 11 refills | Status: AC | PRN
Start: 2024-05-05 — End: ?

## 2024-05-05 MED ORDER — DUPIXENT 300 MG/2ML ~~LOC~~ SOAJ: 300.0000 mg | SUBCUTANEOUS | 1 refills | Status: DC

## 2024-05-05 NOTE — Patient Instructions (Addendum)
 Make sure you check your oxygen  saturation  AT  your highest level of activity (not after you stop)   to be sure it stays over 90% and adjust  02 flow upward to maintain this level if needed but remember to turn it back to previous settings when you stop (to conserve your supply).   Work on inhaler technique:  relax and gently blow all the way out then take a nice smooth full deep breath back in, triggering the inhaler at same time you start breathing in.  Hold breath in for at least  5 seconds if you can. Blow out breztri   thru nose. Rinse and gargle with water when done.  If mouth or throat bother you at all,  try brushing teeth/gums/tongue with arm and hammer toothpaste/ make a slurry and gargle and spit out.     Duxpient every 2 weeks    Please schedule a follow up visit in 3 months but call sooner if needed - bring your inhalers and spacer if you want to use it but be consistent

## 2024-05-05 NOTE — Assessment & Plan Note (Signed)
 Quit smoking 2019/MM - PFT's  12/14/19 FEV1 1.02 (40 % ) ratio 0.39  p 20 % improvement from saba p ? prior to study with DLCO  10.06 (48%) corrects to 2.32 (55%)  for alv volume and FV curve classic concave exp loop - 06/29/2021  After extensive coaching inhaler device,  effectiveness =    90% with dpi, 75% with hfa   - 06/29/2021   Walked on RA x  3  lap(s) =  approx 750 ft @ mod fast pace, stopped due to end of study, sob  with lowest 02 sats 93% - 04/23/2022 rec change propranolol  to bisoprolol   - Labs ordered 08/14/2022  :    alpha one AT phenotype  MM  149  - 02/12/2023   continue breztri  -  08/28/2023  After extensive coaching inhaler device,  effectiveness =   80% (short ti) > continue breztri  and more approp saba - 05/05/2024  After extensive coaching inhaler device,  effectiveness =    75% (short ti) > keep working on hfa/ use spacer if not able to master - 05/05/2024 started on Dupixent  >>>   Advised should see some improvement in doe   and need for saba in all forms as well as prednisone    F/u a 3 month

## 2024-05-05 NOTE — Patient Instructions (Signed)
 Your next DUPIXENT  dose is due on 05/19/24, 06/07/24, and every 14 days thereafter  CONTINUE Breztri  2 puffs twice daily  Your prescription will be shipped from [facility]. Their phone number is [phone] Please call to schedule shipment and confirm address. They will mail your medication to your home.  You will need to be seen by your provider in 3 to 4 months to assess how DUPIXENT  is working for you. Please ensure you have a follow-up appointment scheduled in November or December 2025. Call our clinic if you need to make this appointment.  Stay up to date on all routine vaccines: influenza, pneumonia, COVID19, Shingles  How to manage an injection site reaction: Remember the 5 C's: COUNTER - leave on the counter at least 30 minutes but up to overnight to bring medication to room temperature. This may help prevent stinging COLD - place something cold (like an ice gel pack or cold water bottle) on the injection site just before cleansing with alcohol. This may help reduce pain CLARITIN  - use Claritin  (generic name is loratadine ) for the first two weeks of treatment or the day of, the day before, and the day after injecting. This will help to minimize injection site reactions CORTISONE CREAM - apply if injection site is irritated and itching CALL ME - if injection site reaction is bigger than the size of your fist, looks infected, blisters, or if you develop hives

## 2024-05-06 ENCOUNTER — Telehealth: Payer: Self-pay | Admitting: Internal Medicine

## 2024-05-06 DIAGNOSIS — J9611 Chronic respiratory failure with hypoxia: Secondary | ICD-10-CM

## 2024-05-06 NOTE — Assessment & Plan Note (Signed)
 05/05/2024    sats 94% on 2lpm but desaturated walking slow pace and up to 6lpm   Will need best fit for portable 02 but not a candidate for POC at this point     Each maintenance medication was reviewed in detail including emphasizing most importantly the difference between maintenance and prns and under what circumstances the prns are to be triggered using an action plan format where appropriate.  Total time for H and P, chart review, counseling, reviewing hfa/neb/02 / pulse ox  device(s) , directly observing portions of ambulatory 02 saturation study/ and generating customized AVS unique to this office visit / same day charting = 36 min

## 2024-05-06 NOTE — Telephone Encounter (Signed)
 Refer for best fit eval for portable 02 - not a candidate for POC

## 2024-05-06 NOTE — Telephone Encounter (Signed)
 Also remind her breztri  should be Take 2 puffs first thing in am and then another 2 puffs about 12 hours later.

## 2024-05-07 NOTE — Progress Notes (Signed)
 Ambulatory Pulse Oximetry  Row Name 05/05/24 1525      Resting  Supplemental oxygen  during test? Yes      O2 Flow Rate (L/min) 6 L/min      Type Continuous      Resting Heart Rate 92      Resting Sp02 100      Lap 1 (250 feet)  HR 102      02 Sat 96      Lap 2 (250 feet)  HR 101      02 Sat 87      Lap 3 (250 feet)  HR 88      02 Sat 94      Tech Comments: Patient walked slow pace. Oxyegn level did not stay above 88-90% on room air. Placed on 6L and was able to maintain saturation.

## 2024-05-07 NOTE — Telephone Encounter (Signed)
 DME referral for best fit was placed and I have sent her mychart msg regarding the breztri 

## 2024-05-07 NOTE — Telephone Encounter (Signed)
 SABRA

## 2024-05-18 ENCOUNTER — Ambulatory Visit (HOSPITAL_BASED_OUTPATIENT_CLINIC_OR_DEPARTMENT_OTHER)
Admission: RE | Admit: 2024-05-18 | Discharge: 2024-05-18 | Disposition: A | Discharge status: Home / Self Care | Source: Ambulatory Visit

## 2024-05-18 DIAGNOSIS — R072 Precordial pain: Secondary | ICD-10-CM | POA: Insufficient documentation

## 2024-05-18 DIAGNOSIS — I342 Nonrheumatic mitral (valve) stenosis: Secondary | ICD-10-CM | POA: Insufficient documentation

## 2024-05-19 ENCOUNTER — Ambulatory Visit

## 2024-05-19 VITALS — BP 130/96 | HR 78 | Ht 64.0 in | Wt 157.0 lb

## 2024-05-19 DIAGNOSIS — E782 Mixed hyperlipidemia: Secondary | ICD-10-CM

## 2024-05-19 DIAGNOSIS — I050 Rheumatic mitral stenosis: Secondary | ICD-10-CM | POA: Diagnosis not present

## 2024-05-19 DIAGNOSIS — I2510 Atherosclerotic heart disease of native coronary artery without angina pectoris: Secondary | ICD-10-CM | POA: Diagnosis not present

## 2024-05-19 DIAGNOSIS — E785 Hyperlipidemia, unspecified: Secondary | ICD-10-CM | POA: Insufficient documentation

## 2024-05-19 LAB — ECHOCARDIOGRAM COMPLETE
AR max vel: 1.93 cm2
AV Area VTI: 2.39 cm2
AV Area mean vel: 2.18 cm2
AV Mean grad: 3 mmHg
AV Peak grad: 5.8 mmHg
Ao pk vel: 1.2 m/s
Area-P 1/2: 1.76 cm2
Calc EF: 75.6 %
MV M vel: 4.78 m/s
MV Peak grad: 91.4 mmHg
MV VTI: 1.09 cm2
S' Lateral: 1.6 cm
Single Plane A2C EF: 80.1 %
Single Plane A4C EF: 74 %

## 2024-05-19 MED ORDER — ROSUVASTATIN CALCIUM 5 MG PO TABS: 5.0000 mg | ORAL_TABLET | ORAL | 2 refills | Status: AC

## 2024-05-19 NOTE — Assessment & Plan Note (Signed)
 Would recommend statin therapy in the setting of coronary atherosclerosis noted on cardiac CT. Start Crestor  5 mg 3 times a week. If tolerating well will recheck fasting lipid panel tentatively at next follow-up visit.  Lipid Panel     Component Value Date/Time   CHOL 242 (H) 09/04/2019 1545   TRIG 260 (H) 09/04/2019 1545   HDL 50 09/04/2019 1545   CHOLHDL 4.8 09/04/2019 1545   VLDL 68.4 (H) 08/11/2018 0900   LDLCALC 149 (H) 09/04/2019 1545   LDLDIRECT 173.0 08/11/2018 0900

## 2024-05-19 NOTE — Progress Notes (Signed)
 n  Cardiology Consultation:    Date:  05/19/2024   ID:  Roseline Abram Shoulder, DOB [birth year], MRN [id number]  PCP:  Merna Huxley, NP  Cardiologist:  Alean JONELLE Kobus, MD   Referring MD: Merna Huxley, NP   No chief complaint on file.    ASSESSMENT AND PLAN:   Ms. Pak 68 year old woman history of moderate mitral stenosis and moderately elevated RVSP 50 mmHg by echocardiogram January 2025, with repeat echocardiogram 05/18/2024 showing similar mild to moderate mitral stenosis with mean gradient 5 mmHg at heart rate 70/min and RVSP unable to be estimated, history of lung cancer s/p right lower lobe and left upper lobectomy in 2019, quit smoking in 2020 [45-year smoking history], COPD on 2 L nasal oxygen  flow, hiatal hernia, essential tremors [maintained on propranolol ], anxiety, depression. Now noted with mild nonobstructive coronary artery disease on cardiac CT imaging 03/17/2024. 14-day Zio patch study from May 2025 noted short runs of SVT with the longest episode lasting 14 beats which were asymptomatic.  Here for follow-up visit appears to be at her baseline. Problem List Items Addressed This Visit     Mitral stenosis   Mild to moderate mitral stenosis with mean gradient 5 mmHg at heart rate 70/min on echocardiogram 05/08/2024.  Unlikely to be contributing significantly to her symptoms which seem to be mostly driven by her underlying pulmonary issues.  Will consider repeat echocardiogram in about 12 to 18 months.      Relevant Medications   rosuvastatin  (CRESTOR ) 5 MG tablet (Start on 05/20/2024)   CAD (coronary artery disease) - Primary   Nonobstructive CAD on cardiac CT 03/17/2024, CAD RADS 2 study, calcium  score 25.1, total plaque volume 33 mm cube.  Reviewed the results with her extensively. Proceed with lipid-lowering therapy, initiating Crestor  5 mg 3 times a week.       Relevant Medications   rosuvastatin  (CRESTOR ) 5 MG tablet (Start on 05/20/2024)    Hyperlipidemia   Would recommend statin therapy in the setting of coronary atherosclerosis noted on cardiac CT. Start Crestor  5 mg 3 times a week. If tolerating well will recheck fasting lipid panel tentatively at next follow-up visit.  Lipid Panel     Component Value Date/Time   CHOL 242 (H) 09/04/2019 1545   TRIG 260 (H) 09/04/2019 1545   HDL 50 09/04/2019 1545   CHOLHDL 4.8 09/04/2019 1545   VLDL 68.4 (H) 08/11/2018 0900   LDLCALC 149 (H) 09/04/2019 1545   LDLDIRECT 173.0 08/11/2018 0900          Relevant Medications   rosuvastatin  (CRESTOR ) 5 MG tablet (Start on 05/20/2024)    Return to clinic tentatively in about 9 months.   History of Present Illness:    Zyasia Halbleib is a 68 y.o. female who is being seen today for follow-up visit. PCP is Merna Huxley, NP. Last visit with me in the office was 02/13/2024.  Pleasant woman lives with her roommate and has a dog at home.  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  Here for the visit today accompanied by her sister.  Has history of moderate mitral stenosis on echocardiogram January 2025, moderately elevated RVSP 50 mmHg by echocardiogram January 2025, history of lung cancer s/p right lower lobe and left upper lobectomy in 2019, quit smoking in 2020 [45-year smoking history], COPD on 2 L nasal oxygen  flow, hiatal hernia, essential tremors [maintained on propranolol ], anxiety, depression.  Establish care in May 2025 with us  in the setting of mitral  stenosis identified during pneumonia related inpatient stay in January 2025.  Continue to experience symptoms of shortness of breath and atypical chest pain.  In the setting heart monitor and cardiac CT coronary angiogram recommended.  Heart monitor 14 days study from 02/13/2024 reported predominantly normal sinus rhythm average heart rate 73/min, rare ventricular and supraventricular ectopy burden, 3 short runs of SVT with the longest episode lasting 14 beats.  No other  sustained arrhythmias or high-grade conduction abnormalities.  Cardiac CT coronary angiogram 03/17/2024 noted mild nonobstructive coronary artery disease with CAD RADS 2 study, total plaque volume 33 mm cube and calcium  score 25.1.  Extracardiac findings were significant for new small nodular densities in right upper lobe infectious versus inflammatory in origin a follow-up imaging study in 3 months recommended to  be completed.  Echocardiogram from 05/18/2024 reviewed shows normal biventricular function, mild to moderate mitral stenosis valve anatomy poorly visualized mean gradient 5 mmHg at heart rate 70/min.  RVSP unable to be estimated.  In comparison to prior echocardiogram from January 2025 no significant change.  Overall has been doing similar to her baseline.  No significant change in symptoms.  Somewhat improvement in breathing reported subjectively after she had been started on Dupixent . Able to ambulate minimal amounts at her house.  Denies any chest pain. Denies any pedal edema. Denies any palpitations, lightheadedness, dizziness or syncopal episodes.  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 prior to study with DLCO  10.06 (48%) corrects to 2.32 (55%)   for alv volume and FV curve classic concave exp loop  - 06/29/2021  After extensive coaching inhaler device,  effectiveness =    90% with dpi, 75% with hfa    - 06/29/2021   Walked on RA x  3  lap(s) =  approx 750 ft @ mod fast pace, stop   Depression    Depression, major, single episode, moderate (HCC) 03/19/2008   Qualifier: Diagnosis of   By: Jame  MD, Maude FALCON        Dyspnea    Emphysema, unspecified (HCC)    per patient mild case   Epigastric pain 10/09/2022   Esophageal dysphagia 05/11/2023   Family history of adverse reaction to anesthesia    patient states sister has a hard time waking up from anesthesia.    GERD 03/19/2008   Qualifier: Diagnosis of   By: Jame  MD, Maude FALCON        Grief reaction 01/24/2022   Hepatitis B core antibody positive 10/09/2022   Hepatitis C    Hepatitis C, chronic (HCC) 03/14/2015   Hiatal hernia 10/09/2022   History of hepatitis C 10/09/2022   Left upper lobe pulmonary nodule 02/04/2018  IMPRESSION:  1. Negative for acute pulmonary embolus or aortic dissection  2. 14 mm left upper lobe pulmonary nodule, increased in size  compared to prior and therefore felt suspicious for carcinoma.  Consider correlation with PET-CT and/or tissue sampling.  3. Mild emphysema     Aortic Atherosclerosis (ICD10-I70.0) and Emphysema (ICD10-J43.9).        Electronically Signed    By: Luke Bun M.D   lung ca dx'd 03/2018   Lung mass 03/13/2018   Migraine headache 08/01/2010   Qualifier: Diagnosis of   By: Jame  MD, Maude FALCON        Mitral stenosis    NEPHROLITHIASIS, HX OF 03/19/2008   Qualifier: Diagnosis of   By: Jame  MD, Maude FALCON        Nonrheumatic mitral (valve) stenosis    Palpitations 02/13/2024   Pill dysphagia 10/09/2022   Pleural effusion 10/06/2021   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   SOB (shortness of breath) 09/18/2021   Tobacco use disorder 01/21/2014   Tremor 10/14/2012   Tremor, unspecified    non-specific; takes  inderal .    Vaccine counseling 11/13/2017    Past Surgical History:  Procedure Laterality Date   ABDOMINAL HYSTERECTOMY  1986   menorrhagia; hx of cryo for abnormal paps   APPENDECTOMY     removed in 1986 per pt   BIOPSY  10/31/2022   Procedure: BIOPSY;  Surgeon: Wilhelmenia Aloha Raddle., MD;  Location: [facility];  Service: Gastroenterology;;   CATARACT EXTRACTION Bilateral 07/2022   ESOPHAGOGASTRODUODENOSCOPY (EGD) WITH PROPOFOL  N/A 10/31/2022   Procedure: ESOPHAGOGASTRODUODENOSCOPY (EGD) WITH PROPOFOL ;  Surgeon: Wilhelmenia Aloha Raddle., MD;  Location: [facility];  Service: Gastroenterology;  Laterality: N/A;   INTERCOSTAL NERVE BLOCK Right 12/18/2019   Procedure: Intercostal Nerve Block;  Surgeon: Kerrin Elspeth BROCKS, MD;  Location: [facility] OR;  Service: Thoracic;  Laterality: Right;   LOBECTOMY Left 03/13/2018   Procedure: Lingula section of left upper lobe lobectomy;  Surgeon: Kerrin Elspeth BROCKS, MD;  Location: [facility] OR;  Service: Thoracic;  Laterality: Left;   NODE DISSECTION  12/18/2019   Procedure: Node Dissection;  Surgeon: Kerrin Elspeth BROCKS, MD;  Location: [facility] OR;  Service: Thoracic;;   SAVORY DILATION N/A 10/31/2022   Procedure: HARLEY HODGKIN;  Surgeon: Wilhelmenia Aloha Raddle., MD;  Location: [facility];  Service: Gastroenterology;  Laterality: N/A;   VIDEO ASSISTED THORACOSCOPY (VATS)/WEDGE RESECTION Left 03/13/2018   Procedure: VIDEO ASSISTED THORACOSCOPY (VATS)/WEDGE RESECTION;  Surgeon: Kerrin Elspeth BROCKS, MD;  Location: [facility] OR;  Service: Thoracic;  Laterality: Left;    Current Medications: Current Meds  Medication Sig   [START ON 05/20/2024] rosuvastatin  (CRESTOR ) 5 MG tablet Take 1 tablet (5 mg total) by mouth 3 (three) times a week.     Allergies:   Aspirin, Trelegy ellipta  [fluticasone -umeclidin-vilant], Morphine sulfate, Oxycodone , Hydrocodone , Penicillins, and Sulfa antibiotics   Social History   Socioeconomic History   Marital status: Widowed     Spouse name: Not on file   Number of children: Not on file   Years of education: Not on file   Highest education level: 12th grade  Occupational History   Not on file  Tobacco Use   Smoking status: Former    Current packs/day: 0.00    Average packs/day: 1 pack/day for 40.0 years (40.0 ttl pk-yrs)    Types: Cigarettes    Start date: 12/14/1977    Quit date: 12/14/2017    Years since quitting: 6.4    Passive exposure: Never  Smokeless tobacco: Never  Vaping Use   Vaping status: Never Used  Substance and Sexual Activity   Alcohol use: Not Currently    Comment: occasional bourbon   Drug use: Not Currently   Sexual activity: Never  Other Topics Concern   Not on file  Social History Narrative   Pt has roommate    Retired    Teacher, 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 (02/28/2024)   Overall Financial Resource Strain (CARDIA)    Difficulty of Paying Living Expenses: Somewhat hard  Food Insecurity: No Food Insecurity (02/28/2024)   Hunger Vital Sign    Worried About Running Out of Food in the Last Year: Never true    Ran Out of Food in the Last Year: Never true  Transportation Needs: No Transportation Needs (02/28/2024)   PRAPARE - Administrator, Civil Service (Medical): No    Lack of Transportation (Non-Medical): No  Physical Activity: Unknown (02/28/2024)   Exercise Vital Sign    Days of Exercise per Week: 0 days    Minutes of Exercise per Session: Not on file  Stress: No Stress Concern Present (02/28/2024)   [employer] of Occupational Health - Occupational Stress Questionnaire    Feeling of Stress : Only a little  Social Connections: Unknown (02/28/2024)   Social Connection and Isolation Panel    Frequency of Communication with Friends and Family: More than three times a week    Frequency of Social Gatherings with Friends and Family: Patient declined    Attends Religious Services: Patient declined    Database administrator or  Organizations: No    Attends Banker Meetings: Never    Marital Status: Widowed     Family History: The patient's family history includes Alzheimer's disease (age of onset: 54) in her mother; Asthma in her mother; Cancer in her maternal aunt, paternal aunt, and paternal grandmother; Hypertension in her mother; Lung cancer in her paternal uncle; Neuropathy in her sister; Other in her father. There is no history of Colon cancer, Esophageal cancer, Inflammatory bowel disease, Liver disease, Pancreatic cancer, Rectal cancer, Stomach cancer, Tremor, or Parkinson's disease. ROS:   Please see the history of present illness.    All 14 point review of systems negative except as described per history of present illness.  EKGs/Labs/Other Studies Reviewed:    The following studies were reviewed today:   EKG:       Recent Labs: 10/27/2023: ALT 21; B Natriuretic Peptide 104.2; Magnesium  2.8 10/30/2023: BUN 20; Creatinine, Ser 0.78; Potassium 4.2; Sodium 140 03/02/2024: Hemoglobin 14.4; Platelets 319.0  Recent Lipid Panel    Component Value Date/Time   CHOL 242 (H) 09/04/2019 1545   TRIG 260 (H) 09/04/2019 1545   HDL 50 09/04/2019 1545   CHOLHDL 4.8 09/04/2019 1545   VLDL 68.4 (H) 08/11/2018 0900   LDLCALC 149 (H) 09/04/2019 1545   LDLDIRECT 173.0 08/11/2018 0900    Physical Exam:    VS:  BP (!) 130/96   Pulse 78   Ht 5' 4 (1.626 m)   Wt 157 lb (71.2 kg)   SpO2 96%   BMI 26.95 kg/m     Wt Readings from Last 3 Encounters:  05/19/24 157 lb (71.2 kg)  05/05/24 160 lb (72.6 kg)  03/25/24 159 lb 3.2 oz (72.2 kg)     GENERAL:  Well nourished, on 2 L nasal flow oxygen . NECK: No JVD; No carotid bruits CARDIAC: RRR, S1 and S2 present,  no murmurs, no rubs, no gallops CHEST:  Clear to auscultation without rales, wheezing or rhonchi  Extremities: No pitting pedal edema. Pulses bilaterally symmetric with radial 2+ and dorsalis pedis 2+ NEUROLOGIC:  Alert and oriented x  3  Medication Adjustments/Labs and Tests Ordered: Current medicines are reviewed at length with the patient today.  Concerns regarding medicines are outlined above.  No orders of the defined types were placed in this encounter.  Meds ordered this encounter  Medications   rosuvastatin  (CRESTOR ) 5 MG tablet    Sig: Take 1 tablet (5 mg total) by mouth 3 (three) times a week.    Dispense:  36 tablet    Refill:  2    Signed, Esma Kilts reddy Oma Alpert, MD, MPH, [facility]. 05/19/2024 3:56 PM    [facility]

## 2024-05-19 NOTE — Assessment & Plan Note (Signed)
 Nonobstructive CAD on cardiac CT 03/17/2024, CAD RADS 2 study, calcium  score 25.1, total plaque volume 33 mm cube.  Reviewed the results with her extensively. Proceed with lipid-lowering therapy, initiating Crestor  5 mg 3 times a week.

## 2024-05-19 NOTE — Assessment & Plan Note (Signed)
 Mild to moderate mitral stenosis with mean gradient 5 mmHg at heart rate 70/min on echocardiogram 05/08/2024.  Unlikely to be contributing significantly to her symptoms which seem to be mostly driven by her underlying pulmonary issues.  Will consider repeat echocardiogram in about 12 to 18 months.

## 2024-05-19 NOTE — Patient Instructions (Addendum)
 Medication Instructions:  START TAKING CRESTOR  5 MG 3 TIMES WEEKLY.  Lab Work: NONE TO BE DONE TODAY.   Testing/Procedures: NONE  Follow-Up: At [facility], you and your health needs are our priority.  As part of our continuing mission to provide you with exceptional heart care, our providers are all part of one team.  This team includes your primary Cardiologist (physician) and Advanced Practice Providers or APPs (Physician Assistants and Nurse Practitioners) who all work together to provide you with the care you need, when you need it.  Your next appointment:   9 MONTHS  Provider:   Alean Kobus, MD

## 2024-05-20 ENCOUNTER — Other Ambulatory Visit: Payer: Self-pay | Admitting: Gastroenterology

## 2024-05-22 ENCOUNTER — Other Ambulatory Visit: Payer: Self-pay | Admitting: Internal Medicine

## 2024-05-22 ENCOUNTER — Other Ambulatory Visit: Payer: Self-pay | Admitting: Adult Health

## 2024-05-22 DIAGNOSIS — F419 Anxiety disorder, unspecified: Secondary | ICD-10-CM

## 2024-05-26 ENCOUNTER — Encounter: Payer: Self-pay | Admitting: Family Medicine

## 2024-05-28 ENCOUNTER — Encounter: Payer: Self-pay | Admitting: Internal Medicine

## 2024-05-29 ENCOUNTER — Other Ambulatory Visit: Payer: Self-pay | Admitting: Adult Health

## 2024-05-29 DIAGNOSIS — F321 Major depressive disorder, single episode, moderate: Secondary | ICD-10-CM

## 2024-06-02 ENCOUNTER — Ambulatory Visit: Payer: Self-pay | Admitting: Internal Medicine

## 2024-06-02 ENCOUNTER — Encounter: Payer: Self-pay | Admitting: Adult Health

## 2024-06-02 DIAGNOSIS — F331 Major depressive disorder, recurrent, moderate: Secondary | ICD-10-CM | POA: Diagnosis not present

## 2024-06-02 DIAGNOSIS — Z515 Encounter for palliative care: Secondary | ICD-10-CM | POA: Diagnosis not present

## 2024-06-02 DIAGNOSIS — Z008 Encounter for other general examination: Secondary | ICD-10-CM | POA: Diagnosis not present

## 2024-06-02 DIAGNOSIS — R0602 Shortness of breath: Secondary | ICD-10-CM | POA: Diagnosis not present

## 2024-06-02 NOTE — Telephone Encounter (Addendum)
 Attempted to reach the patient without success. LVM to call back. Routing to clinic.     Copied from CRM [id number]. Topic: Clinical - Red Word Triage >> Jun 02, 2024  3:29 PM [name] wrote: Red Word that prompted transfer to Nurse Triage: sob, wheezing hadbrochits

## 2024-06-03 MED ORDER — PROPRANOLOL HCL 20 MG PO TABS: ORAL_TABLET | ORAL | 0 refills | Status: AC

## 2024-06-05 ENCOUNTER — Ambulatory Visit: Admitting: Physician Assistant

## 2024-06-05 ENCOUNTER — Other Ambulatory Visit: Payer: Self-pay | Admitting: Adult Health

## 2024-06-05 DIAGNOSIS — F321 Major depressive disorder, single episode, moderate: Secondary | ICD-10-CM

## 2024-06-08 ENCOUNTER — Telehealth: Payer: Self-pay

## 2024-06-08 NOTE — Telephone Encounter (Signed)
 PCC's, can ou see what the hold up is for this pt's O2 order? Pt states they didn't receive it but this was sent a month ago.

## 2024-06-08 NOTE — Telephone Encounter (Signed)
 I have sent an urgent message to [employer] about this issue

## 2024-06-09 ENCOUNTER — Ambulatory Visit (INDEPENDENT_AMBULATORY_CARE_PROVIDER_SITE_OTHER): Admitting: Adult Health

## 2024-06-09 ENCOUNTER — Encounter: Payer: Self-pay | Admitting: Adult Health

## 2024-06-09 VITALS — BP 120/80 | HR 76 | Temp 98.3°F | Ht 64.0 in | Wt 163.0 lb

## 2024-06-09 DIAGNOSIS — F419 Anxiety disorder, unspecified: Secondary | ICD-10-CM

## 2024-06-09 DIAGNOSIS — F5104 Psychophysiologic insomnia: Secondary | ICD-10-CM

## 2024-06-09 DIAGNOSIS — J441 Chronic obstructive pulmonary disease with (acute) exacerbation: Secondary | ICD-10-CM

## 2024-06-09 DIAGNOSIS — F32A Depression, unspecified: Secondary | ICD-10-CM | POA: Diagnosis not present

## 2024-06-09 MED ORDER — CLONAZEPAM 0.5 MG PO TABS: 0.5000 mg | ORAL_TABLET | Freq: Three times a day (TID) | ORAL | 3 refills | Status: AC | PRN

## 2024-06-09 NOTE — Telephone Encounter (Signed)
 I received a message from [place] with [employer] [facility]   So it looks like on 05/27/2024 they scheduled her and on 8/28 she called stating she needed more tanks due to liter increase. RT that does the walks asked her to please have provider update us  with what liter flow pt should be on (this was also on 05/28/2024) Tanks were delivered 05/29/2024 and that was the last on things i see noted.

## 2024-06-09 NOTE — Progress Notes (Signed)
 Subjective:    Patient ID: [name], female    DOB: 31-Mar-1956, 68 y.o.   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), Bronchitis, CAP (community acquired pneumonia) (10/01/2023), Chest pain (10/09/2022), Chronic bronchitis (HCC) (02/04/2018), Chronic respiratory failure with hypoxia (HCC) (10/06/2021), Chronic rhinitis (10/06/2021), Cirrhosis (HCC) (11/13/2017), Colon cancer screening (10/09/2022), Community acquired pneumonia (10/03/2023), COPD GOLD 3  (06/29/2021), Depression, Depression, major, single episode, moderate (HCC) (03/19/2008), Dyspnea, Emphysema, unspecified (HCC), Epigastric pain (10/09/2022), Esophageal dysphagia (05/11/2023), Family history of adverse reaction to anesthesia, GERD (03/19/2008), Grief reaction (01/24/2022), 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), Hepatitis C, Hepatitis C, chronic (HCC) (03/14/2015), Hiatal hernia (10/09/2022), History of hepatitis C (10/09/2022), Left upper lobe pulmonary nodule (02/04/2018), lung ca (dx'd 03/2018), Lung mass (03/13/2018), Migraine headache (08/01/2010), Mitral stenosis, NEPHROLITHIASIS, HX OF (03/19/2008), Nonrheumatic mitral (valve) stenosis, Palpitations (02/13/2024), Pill dysphagia (10/09/2022), Pleural effusion (10/06/2021), S/P partial lobectomy of lung (12/18/2019), SOB (shortness of breath) (09/18/2021), Tobacco use disorder (01/21/2014), Tremor (10/14/2012), Tremor, unspecified, and Vaccine counseling (11/13/2017).  She presents to the office today for insomnia and anxiety. She was prescribe klonopin  0.5 mg in the past to help with air hunger for COPD. She reports that she stopped this cold [place]  about six weeks ago and since that time she has had significant anxiety and unable to sleep despite taking Trazodone  at night. She feels like she cannot turn off her brain at night. She would like to go back on Klonopin .    Review of Systems See HPI   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 prior to study with DLCO  10.06 (48%) corrects to 2.32 (55%)  for alv volume and FV curve classic concave exp loop  - 06/29/2021  After extensive coaching inhaler device,  effectiveness =    90% with dpi, 75% with hfa    - 06/29/2021   Walked on RA x  3  lap(s) =  approx 750 ft @ mod fast pace, stop   Depression    Depression, major, single episode, moderate (HCC) 03/19/2008   Qualifier: Diagnosis of   By: Jame  MD, Maude FALCON        Dyspnea    Emphysema, unspecified (HCC)    per patient mild case   Epigastric pain 10/09/2022   Esophageal dysphagia 05/11/2023   Family history of adverse reaction to anesthesia    patient states sister has a hard time waking up from anesthesia.  GERD 03/19/2008   Qualifier: Diagnosis of   By: Jame  MD, Maude FALCON        Grief reaction 01/24/2022   Hepatitis B core antibody positive 10/09/2022   Hepatitis C    Hepatitis C, chronic  (HCC) 03/14/2015   Hiatal hernia 10/09/2022   History of hepatitis C 10/09/2022   Left upper lobe pulmonary nodule 02/04/2018   IMPRESSION:  1. Negative for acute pulmonary embolus or aortic dissection  2. 14 mm left upper lobe pulmonary nodule, increased in size  compared to prior and therefore felt suspicious for carcinoma.  Consider correlation with PET-CT and/or tissue sampling.  3. Mild emphysema     Aortic Atherosclerosis (ICD10-I70.0) and Emphysema (ICD10-J43.9).        Electronically Signed    By: Luke Bun M.D   lung ca dx'd 03/2018   Lung mass 03/13/2018   Migraine headache 08/01/2010   Qualifier: Diagnosis of   By: Jame  MD, Maude FALCON        Mitral stenosis    NEPHROLITHIASIS, HX OF 03/19/2008   Qualifier: Diagnosis of   By: Jame  MD, Maude FALCON        Nonrheumatic mitral (valve) stenosis    Palpitations 02/13/2024   Pill dysphagia 10/09/2022   Pleural effusion 10/06/2021   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   SOB (shortness of breath) 09/18/2021   Tobacco use disorder 01/21/2014   Tremor 10/14/2012   Tremor, unspecified    non-specific; takes inderal .    Vaccine counseling 11/13/2017    Social History   Socioeconomic History   Marital status: Widowed    Spouse name: Not on file   Number of children: Not on file   Years of education: Not on file   Highest education level: 12th grade  Occupational History   Not on file  Tobacco Use   Smoking status: Former    Current packs/day: 0.00    Average packs/day: 1 pack/day for 40.0 years (40.0 ttl pk-yrs)    Types: Cigarettes    Start date: 12/14/1977    Quit date: 12/14/2017    Years since quitting: 6.4    Passive exposure: Never   Smokeless tobacco: Never  Vaping Use   Vaping status: Never Used  Substance and Sexual Activity   Alcohol use: Not Currently    Comment: occasional bourbon   Drug use: Not Currently   Sexual activity: Never  Other Topics Concern   Not on file  Social History  Narrative   Pt has roommate    Retired    Teacher, early years/pre Strain: Low Risk  (06/07/2024)   Overall Financial Resource Strain (CARDIA)    Difficulty of Paying Living Expenses: Not very hard  Food Insecurity: No Food Insecurity (06/07/2024)   Hunger Vital Sign    Worried About Running Out of Food in the Last Year: Never true    Ran Out of Food in the Last Year: Never true  Transportation Needs: No Transportation Needs (06/07/2024)   PRAPARE - Administrator, Civil Service (Medical): No    Lack of Transportation (Non-Medical): No  Physical Activity: Unknown (06/07/2024)   Exercise Vital Sign    Days of Exercise per Week: Patient declined    Minutes of Exercise per Session: Not on file  Stress: Stress Concern Present (06/07/2024)   [employer] of Occupational Health - Occupational Stress Questionnaire    Feeling of Stress: Rather much  Social  Connections: Socially Isolated (06/07/2024)   Social Connection and Isolation Panel    Frequency of Communication with Friends and Family: Three times a week    Frequency of Social Gatherings with Friends and Family: Once a week    Attends Religious Services: Patient declined    Database administrator or Organizations: No    Attends Engineer, structural: Not on file    Marital Status: Widowed  Intimate Partner Violence: Not At Risk (11/01/2023)   Humiliation, Afraid, Rape, and Kick questionnaire    Fear of Current or Ex-Partner: No    Emotionally Abused: No    Physically Abused: No    Sexually Abused: No    Past Surgical History:  Procedure Laterality Date   ABDOMINAL HYSTERECTOMY  1986   menorrhagia; hx of cryo for abnormal paps   APPENDECTOMY     removed in 1986 per pt   BIOPSY  10/31/2022   Procedure: BIOPSY;  Surgeon: Wilhelmenia Aloha Raddle., MD;  Location: [facility];  Service: Gastroenterology;;   CATARACT EXTRACTION Bilateral 07/2022   ESOPHAGOGASTRODUODENOSCOPY (EGD) WITH PROPOFOL   N/A 10/31/2022   Procedure: ESOPHAGOGASTRODUODENOSCOPY (EGD) WITH PROPOFOL ;  Surgeon: Wilhelmenia Aloha Raddle., MD;  Location: [facility];  Service: Gastroenterology;  Laterality: N/A;   INTERCOSTAL NERVE BLOCK Right 12/18/2019   Procedure: Intercostal Nerve Block;  Surgeon: Kerrin Elspeth BROCKS, MD;  [facility];  Service: Thoracic;  Laterality: Right;   LOBECTOMY Left 03/13/2018   Procedure: Lingula section of left upper lobe lobectomy;  Surgeon: Kerrin Elspeth BROCKS, MD;  Location: [facility] OR;  Service: Thoracic;  Laterality: Left;   NODE DISSECTION  12/18/2019   Procedure: Node Dissection;  Surgeon: Kerrin Elspeth BROCKS, MD;  Location: [facility] OR;  Service: Thoracic;;   SAVORY DILATION N/A 10/31/2022   Procedure: HARLEY HODGKIN;  Surgeon: Wilhelmenia Aloha Raddle., MD;  Location: [facility];  Service: Gastroenterology;  Laterality: N/A;   VIDEO ASSISTED THORACOSCOPY (VATS)/WEDGE RESECTION Left 03/13/2018   Procedure: VIDEO ASSISTED THORACOSCOPY (VATS)/WEDGE RESECTION;  Surgeon: Kerrin Elspeth BROCKS, MD;  Location: [facility] OR;  Service: Thoracic;  Laterality: Left;    Family History  Problem Relation Age of Onset   Asthma Mother    Alzheimer's disease Mother 61   Hypertension Mother    Other Father        shot    Neuropathy Sister        face   Cancer Maternal Aunt        breast   Cancer Paternal Aunt    Lung cancer Paternal Uncle    Cancer Paternal Grandmother        abdominal   Colon cancer Neg Hx    Esophageal cancer Neg Hx    Inflammatory bowel disease Neg Hx    Liver disease Neg Hx    Pancreatic cancer Neg Hx    Rectal cancer Neg Hx    Stomach cancer Neg Hx    Tremor Neg Hx    Parkinson's disease Neg Hx     Allergies  Allergen Reactions   Aspirin Shortness Of Breath   Trelegy Ellipta  [Fluticasone -Umeclidin-Vilant] Shortness Of Breath    She reports that is made her cough worse.    Morphine Sulfate Other (See Comments)    RED STREAKS ON ARMS.   Oxycodone  Itching and  Rash   Hydrocodone  Itching   Penicillins Other (See Comments)     Heartburn, upset stomach only & tolerated AMOXIL  after this reaction   Sulfa Antibiotics Tinitus    Current Outpatient  Medications on File Prior to Visit  Medication Sig Dispense Refill   albuterol  (PROVENTIL ) (2.5 MG/3ML) 0.083% nebulizer solution TAKE 3 ML (2.5 MG TOTAL) BY NEBULIZATION EVERY 4 HOURS AS NEEDED FOR WHEEZING OR SHORTNESS OF BREATH 300 mL 3   albuterol  (VENTOLIN  HFA) 108 (90 Base) MCG/ACT inhaler Inhale 2 puffs into the lungs every 4 (four) hours as needed for wheezing or shortness of breath. 18 g 11   Budeson-Glycopyrrol-Formoterol  (BREZTRI  AEROSPHERE) 160-9-4.8 MCG/ACT AERO Inhale 2 puffs into the lungs 2 (two) times daily. (Patient taking differently: Inhale 1 puff into the lungs 2 (two) times daily.) 3 each 4   clonazePAM  (KLONOPIN ) 0.5 MG tablet Take 1 tablet (0.5 mg total) by mouth 3 (three) times daily as needed. for anxiety 90 tablet 3   Dupilumab  (DUPIXENT ) 300 MG/2ML SOAJ Inject 300 mg into the skin every 14 (fourteen) days. 12 mL 1   esomeprazole  (NEXIUM ) 40 MG capsule Take 1 capsule (40 mg total) by mouth daily. 30 capsule 2   guaiFENesin  (MUCINEX ) 600 MG 12 hr tablet Take 600 mg by mouth daily.     hydrOXYzine  (VISTARIL ) 25 MG capsule Take 1 capsule (25 mg total) by mouth every 8 (eight) hours as needed. 30 capsule 0   PARoxetine  (PAXIL ) 40 MG tablet TAKE 1 TABLET BY MOUTH EVERY DAY 90 tablet 0   propranolol  (INDERAL ) 20 MG tablet Take 1 capsule (60 mg total) by mouth daily. 270 tablet 0   rosuvastatin  (CRESTOR ) 5 MG tablet Take 1 tablet (5 mg total) by mouth 3 (three) times a week. 36 tablet 2   traZODone  (DESYREL ) 100 MG tablet TAKE 1 TABLET (100 MG TOTAL) BY MOUTH AT BEDTIME AS NEEDED. FOR SLEEP. NEED PHYSICAL EXAM FOR FURTHER REFILLS 90 tablet 1   No current facility-administered medications on file prior to visit.    BP 120/80   Pulse 76   Temp 98.3 F (36.8 C) (Oral)   Ht 5' 4 (1.626 m)    Wt 163 lb (73.9 kg)   SpO2 96%   BMI 27.98 kg/m       Objective:   Physical Exam Vitals and nursing note reviewed.  Constitutional:      Appearance: Normal appearance.  Cardiovascular:     Rate and Rhythm: Normal rate and regular rhythm.     Pulses: Normal pulses.     Heart sounds: Normal heart sounds.  Pulmonary:     Effort: Pulmonary effort is normal.     Breath sounds: Normal breath sounds.  Skin:    General: Skin is warm and dry.  Neurological:     General: No focal deficit present.     Mental Status: She is alert and oriented to person, place, and time.  Psychiatric:        Mood and Affect: Mood normal.        Behavior: Behavior normal.        Thought Content: Thought content normal.        Judgment: Judgment normal.        Assessment & Plan:  1. Anxiety and depression (Primary)  - clonazePAM  (KLONOPIN ) 0.5 MG tablet; Take 1 tablet (0.5 mg total) by mouth 3 (three) times daily as needed. for anxiety  Dispense: 90 tablet; Refill: 3 - Advised against stopping cold [place] in the future   2. COPD with exacerbation (HCC)  - clonazePAM  (KLONOPIN ) 0.5 MG tablet; Take 1 tablet (0.5 mg total) by mouth 3 (three) times daily as needed. for anxiety  Dispense:  90 tablet; Refill: 3  3. 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 Going back on Klonopin  should resolve this   Darleene Shape, NP

## 2024-06-15 DIAGNOSIS — J441 Chronic obstructive pulmonary disease with (acute) exacerbation: Secondary | ICD-10-CM | POA: Diagnosis not present

## 2024-06-19 NOTE — Telephone Encounter (Signed)
 Dr Darlean, can you confirm if pt is on 2L O2?

## 2024-06-20 NOTE — Telephone Encounter (Signed)
 05/05/2024    sats 94% on 2lpm but desaturated walking slow pace and up to 6lpm > referred for best fit but not a candidate for POC   So 6lpm walking and 2lpm all other times

## 2024-06-22 ENCOUNTER — Other Ambulatory Visit: Payer: Self-pay | Admitting: *Deleted

## 2024-06-22 DIAGNOSIS — J4489 Other specified chronic obstructive pulmonary disease: Secondary | ICD-10-CM

## 2024-06-22 DIAGNOSIS — J9611 Chronic respiratory failure with hypoxia: Secondary | ICD-10-CM

## 2024-06-26 ENCOUNTER — Telehealth: Payer: Self-pay | Admitting: Internal Medicine

## 2024-06-26 NOTE — Telephone Encounter (Signed)
 Per 8/28 encounter 05/05/2024    sats 94% on 2lpm but desaturated walking slow pace and up to 6lpm > referred for best fit but not a candidate for POC    So 6lpm walking and 2lpm all other times     Called and spoke with the patient and explained why pt was not a candidate for POC per Dr. Leisa note.  Pt verbalized understanding.  Nfn

## 2024-06-26 NOTE — Telephone Encounter (Signed)
 Copied from CRM #[id number]. Topic: Clinical - Order For Equipment >> Jun 26, 2024 11:36 AM [name] wrote: Reason for CRM: Patient spoke with [employer] and was told she isn't eligible for a POC - Would like to confirm exactly what is going on.   Callback number: [phone]

## 2024-07-01 ENCOUNTER — Inpatient Hospital Stay: Payer: Medicare HMO | Attending: Physician Assistant

## 2024-07-01 ENCOUNTER — Ambulatory Visit (HOSPITAL_COMMUNITY)
Admission: RE | Admit: 2024-07-01 | Discharge: 2024-07-01 | Disposition: A | Discharge status: Home / Self Care | Source: Ambulatory Visit | Attending: Physician Assistant | Admitting: Physician Assistant

## 2024-07-01 DIAGNOSIS — R1310 Dysphagia, unspecified: Secondary | ICD-10-CM | POA: Diagnosis not present

## 2024-07-01 DIAGNOSIS — J9611 Chronic respiratory failure with hypoxia: Secondary | ICD-10-CM | POA: Insufficient documentation

## 2024-07-01 DIAGNOSIS — C3492 Malignant neoplasm of unspecified part of left bronchus or lung: Secondary | ICD-10-CM | POA: Diagnosis not present

## 2024-07-01 DIAGNOSIS — Z902 Acquired absence of lung [part of]: Secondary | ICD-10-CM | POA: Insufficient documentation

## 2024-07-01 DIAGNOSIS — M62838 Other muscle spasm: Secondary | ICD-10-CM | POA: Diagnosis not present

## 2024-07-01 DIAGNOSIS — G8912 Acute post-thoracotomy pain: Secondary | ICD-10-CM | POA: Diagnosis not present

## 2024-07-01 DIAGNOSIS — B182 Chronic viral hepatitis C: Secondary | ICD-10-CM | POA: Diagnosis not present

## 2024-07-01 DIAGNOSIS — N200 Calculus of kidney: Secondary | ICD-10-CM | POA: Insufficient documentation

## 2024-07-01 DIAGNOSIS — Z886 Allergy status to analgesic agent status: Secondary | ICD-10-CM | POA: Insufficient documentation

## 2024-07-01 DIAGNOSIS — M19049 Primary osteoarthritis, unspecified hand: Secondary | ICD-10-CM | POA: Diagnosis not present

## 2024-07-01 DIAGNOSIS — I700 Atherosclerosis of aorta: Secondary | ICD-10-CM | POA: Diagnosis not present

## 2024-07-01 DIAGNOSIS — Z8619 Personal history of other infectious and parasitic diseases: Secondary | ICD-10-CM | POA: Diagnosis not present

## 2024-07-01 DIAGNOSIS — Z8701 Personal history of pneumonia (recurrent): Secondary | ICD-10-CM | POA: Insufficient documentation

## 2024-07-01 DIAGNOSIS — J4489 Other specified chronic obstructive pulmonary disease: Secondary | ICD-10-CM | POA: Insufficient documentation

## 2024-07-01 DIAGNOSIS — Z880 Allergy status to penicillin: Secondary | ICD-10-CM | POA: Insufficient documentation

## 2024-07-01 DIAGNOSIS — J310 Chronic rhinitis: Secondary | ICD-10-CM | POA: Diagnosis not present

## 2024-07-01 DIAGNOSIS — Z79899 Other long term (current) drug therapy: Secondary | ICD-10-CM | POA: Diagnosis not present

## 2024-07-01 DIAGNOSIS — Z9049 Acquired absence of other specified parts of digestive tract: Secondary | ICD-10-CM | POA: Insufficient documentation

## 2024-07-01 DIAGNOSIS — F17200 Nicotine dependence, unspecified, uncomplicated: Secondary | ICD-10-CM | POA: Diagnosis not present

## 2024-07-01 DIAGNOSIS — K7460 Unspecified cirrhosis of liver: Secondary | ICD-10-CM | POA: Insufficient documentation

## 2024-07-01 DIAGNOSIS — Z87442 Personal history of urinary calculi: Secondary | ICD-10-CM | POA: Insufficient documentation

## 2024-07-01 DIAGNOSIS — Z9010 Acquired absence of unspecified breast and nipple: Secondary | ICD-10-CM | POA: Diagnosis not present

## 2024-07-01 DIAGNOSIS — J90 Pleural effusion, not elsewhere classified: Secondary | ICD-10-CM | POA: Insufficient documentation

## 2024-07-01 DIAGNOSIS — Z90710 Acquired absence of both cervix and uterus: Secondary | ICD-10-CM | POA: Insufficient documentation

## 2024-07-01 DIAGNOSIS — K760 Fatty (change of) liver, not elsewhere classified: Secondary | ICD-10-CM | POA: Diagnosis not present

## 2024-07-01 DIAGNOSIS — C3412 Malignant neoplasm of upper lobe, left bronchus or lung: Secondary | ICD-10-CM | POA: Insufficient documentation

## 2024-07-01 DIAGNOSIS — Z9981 Dependence on supplemental oxygen: Secondary | ICD-10-CM | POA: Insufficient documentation

## 2024-07-01 DIAGNOSIS — Z882 Allergy status to sulfonamides status: Secondary | ICD-10-CM | POA: Insufficient documentation

## 2024-07-01 DIAGNOSIS — Z885 Allergy status to narcotic agent status: Secondary | ICD-10-CM | POA: Insufficient documentation

## 2024-07-01 DIAGNOSIS — C3491 Malignant neoplasm of unspecified part of right bronchus or lung: Secondary | ICD-10-CM | POA: Insufficient documentation

## 2024-07-01 LAB — CBC WITH DIFFERENTIAL (CANCER CENTER ONLY)
Abs Immature Granulocytes: 0.02 K/uL (ref 0.00–0.07)
Basophils Absolute: 0.1 K/uL (ref 0.0–0.1)
Basophils Relative: 1 %
Eosinophils Absolute: 0.3 K/uL (ref 0.0–0.5)
Eosinophils Relative: 4 %
HCT: 43.4 % (ref 36.0–46.0)
Hemoglobin: 14.3 g/dL (ref 12.0–15.0)
Immature Granulocytes: 0 %
Lymphocytes Relative: 21 %
Lymphs Abs: 1.4 K/uL (ref 0.7–4.0)
MCH: 27.3 pg (ref 26.0–34.0)
MCHC: 32.9 g/dL (ref 30.0–36.0)
MCV: 82.8 fL (ref 80.0–100.0)
Monocytes Absolute: 0.5 K/uL (ref 0.1–1.0)
Monocytes Relative: 8 %
Neutro Abs: 4.5 K/uL (ref 1.7–7.7)
Neutrophils Relative %: 66 %
Platelet Count: 307 K/uL (ref 150–400)
RBC: 5.24 MIL/uL — ABNORMAL HIGH (ref 3.87–5.11)
RDW: 13.2 % (ref 11.5–15.5)
WBC Count: 6.8 K/uL (ref 4.0–10.5)
nRBC: 0 % (ref 0.0–0.2)

## 2024-07-01 LAB — CMP (CANCER CENTER ONLY)
ALT: 18 U/L (ref 0–44)
AST: 23 U/L (ref 15–41)
Albumin: 4.4 g/dL (ref 3.5–5.0)
Alkaline Phosphatase: 94 U/L (ref 38–126)
Anion gap: 6 (ref 5–15)
BUN: 16 mg/dL (ref 8–23)
CO2: 32 mmol/L (ref 22–32)
Calcium: 9.5 mg/dL (ref 8.9–10.3)
Chloride: 100 mmol/L (ref 98–111)
Creatinine: 0.95 mg/dL (ref 0.44–1.00)
GFR, Estimated: >60 mL/min (ref >60)
Glucose, Bld: 104 mg/dL — ABNORMAL HIGH (ref 70–99)
Potassium: 4.6 mmol/L (ref 3.5–5.1)
Sodium: 138 mmol/L (ref 135–145)
Total Bilirubin: 0.8 mg/dL (ref 0.0–1.2)
Total Protein: 7.7 g/dL (ref 6.5–8.1)

## 2024-07-01 MED ORDER — IOHEXOL 300 MG/ML  SOLN
75.0000 mL | Freq: Once | INTRAMUSCULAR | Status: AC | PRN
  Administered 2024-07-01: 75 mL via INTRAVENOUS

## 2024-07-01 MED ORDER — SODIUM CHLORIDE (PF) 0.9 % IJ SOLN
INTRAMUSCULAR | Status: AC
  Filled 2024-07-01: qty 50

## 2024-07-02 ENCOUNTER — Telehealth: Payer: Self-pay

## 2024-07-02 ENCOUNTER — Inpatient Hospital Stay (HOSPITAL_BASED_OUTPATIENT_CLINIC_OR_DEPARTMENT_OTHER): Admitting: Internal Medicine

## 2024-07-02 VITALS — Ht 64.0 in

## 2024-07-02 DIAGNOSIS — C3490 Malignant neoplasm of unspecified part of unspecified bronchus or lung: Secondary | ICD-10-CM

## 2024-07-02 DIAGNOSIS — C3412 Malignant neoplasm of upper lobe, left bronchus or lung: Secondary | ICD-10-CM | POA: Diagnosis not present

## 2024-07-02 NOTE — Telephone Encounter (Signed)
 Received phone call from patient regarding today's appointment. Patient requested to change the in-person visit to a video visit due to not feeling well. Dr. Sherrod was notified and approved the change to a video visit. Patient was informed and voiced understanding of the change.

## 2024-07-02 NOTE — Progress Notes (Signed)
 [facility] [phone]   [phone]  PROGRESS NOTE FOR TELEMEDICINE VISITS  Merna Huxley, NP [address] [place] [address]  I connected withNAME@ on 07/02/24 at 11:00 AM EDT by video enabled telemedicine visit and verified that I am speaking with the correct person using two identifiers.   I discussed the limitations, risks, security and privacy concerns of performing an evaluation and management service by telemedicine and the availability of in-person appointments. I also discussed with the patient that there may be a patient responsible charge related to this service. The patient expressed understanding and agreed to proceed.  Other persons participating in the visit and their role in the encounter:  None  Patient's location:  Home Provider's location: [facility]  DIAGNOSIS:  1) Stage Ib (T2, N0, M0) non-small cell lung cancer, adenocarcinoma diagnosed in June 2019. 2) stage Ia (T1 a, N0, M0) non-small cell lung cancer, adenocarcinoma status post   PRIOR THERAPY: 1) status post wedge resection of the left upper lobe nodule in addition to lingular segmentectomy and mediastinal lymph node sampling under the care of Dr. Kerrin on March 13, 2018.  Right lower lobe segmentectomy under the care of Dr. Kerrin on December 18, 2019.    CURRENT THERAPY: Observation   INTERVAL HISTORY: [name] 68 y.o. 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 Discussed the use of AI scribe software for clinical note transcription with the patient, who gave verbal consent to proceed.  History of Present Illness [name] is a 68 year old female with stage 1B adenocarcinoma of the lung who presents for discussion of her recent CT scan results.  She was diagnosed with stage 1B adenocarcinoma of the lung in June 2019. She underwent a wedge resection  of the left upper lobe lung nodule in 2019 and a right lower lobe segmentectomy in 2021. Since then, she has been under observation.  She is currently on oxygen  therapy, with her oxygen  requirement increased to 3-5 liters. She experiences pain in her torso, which she attributes to muscle spasms from previous surgeries, affecting both sides equally. No new respiratory complaints besides breathing issues.  No chest pain, hemoptysis, nausea, vomiting, weight loss, headaches, or changes in vision. She notes a significant weight gain from 120 pounds to 265 pounds over the past couple of years.     MEDICAL HISTORY: 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 prior to study with DLCO  10.06 (48%) corrects to 2.32 (55%)  for alv volume and FV curve classic concave exp loop  - 06/29/2021  After extensive coaching inhaler device,  effectiveness =  90% with dpi, 75% with hfa    - 06/29/2021   Walked on RA x  3  lap(s) =  approx 750 ft @ mod fast pace, stop   Depression    Depression, major, single episode, moderate (HCC) 03/19/2008   Qualifier: Diagnosis of   By: Jame  MD, Maude FALCON        Dyspnea    Emphysema, unspecified (HCC)    per patient mild case   Epigastric  pain 10/09/2022   Esophageal dysphagia 05/11/2023   Family history of adverse reaction to anesthesia    patient states sister has a hard time waking up from anesthesia.    GERD 03/19/2008   Qualifier: Diagnosis of   By: Jame  MD, Maude FALCON        Grief reaction 01/24/2022   Hepatitis B core antibody positive 10/09/2022   Hepatitis C    Hepatitis C, chronic (HCC) 03/14/2015   Hiatal hernia 10/09/2022   History of hepatitis C 10/09/2022   Left upper lobe pulmonary nodule 02/04/2018   IMPRESSION:  1. Negative for acute pulmonary embolus or aortic dissection  2. 14 mm left upper lobe pulmonary nodule, increased in size  compared to prior and therefore felt suspicious for carcinoma.  Consider correlation with PET-CT and/or tissue sampling.  3. Mild emphysema     Aortic Atherosclerosis (ICD10-I70.0) and Emphysema (ICD10-J43.9).        Electronically Signed    By: Luke Bun M.D   lung ca dx'd 03/2018   Lung mass 03/13/2018   Migraine headache 08/01/2010   Qualifier: Diagnosis of   By: Jame  MD, Maude FALCON        Mitral stenosis    NEPHROLITHIASIS, HX OF 03/19/2008   Qualifier: Diagnosis of   By: Jame  MD, Maude FALCON        Nonrheumatic mitral (valve) stenosis    Palpitations 02/13/2024   Pill dysphagia 10/09/2022   Pleural effusion 10/06/2021   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   SOB (shortness of breath) 09/18/2021   Tobacco use disorder 01/21/2014   Tremor 10/14/2012   Tremor, unspecified    non-specific; takes inderal .    Vaccine counseling 11/13/2017    ALLERGIES:  is allergic to aspirin, trelegy ellipta  [fluticasone -umeclidin-vilant], morphine sulfate, oxycodone , hydrocodone , penicillins, and sulfa antibiotics.  MEDICATIONS:  Current Outpatient Medications  Medication Sig Dispense Refill   albuterol  (PROVENTIL ) (2.5 MG/3ML) 0.083% nebulizer solution TAKE 3 ML (2.5 MG TOTAL) BY NEBULIZATION EVERY 4 HOURS AS NEEDED FOR WHEEZING OR SHORTNESS OF BREATH 300 mL 3    albuterol  (VENTOLIN  HFA) 108 (90 Base) MCG/ACT inhaler Inhale 2 puffs into the lungs every 4 (four) hours as needed for wheezing or shortness of breath. 18 g 11   Budeson-Glycopyrrol-Formoterol  (BREZTRI  AEROSPHERE) 160-9-4.8 MCG/ACT AERO Inhale 2 puffs into the lungs 2 (two) times daily. (Patient taking differently: Inhale 1 puff into the lungs 2 (two) times daily.) 3 each 4   clonazePAM  (KLONOPIN ) 0.5 MG tablet Take 1 tablet (0.5 mg total) by mouth 3 (three) times daily as needed. for anxiety 90 tablet 3   Dupilumab  (DUPIXENT ) 300 MG/2ML SOAJ Inject 300 mg into the skin every 14 (fourteen) days. 12 mL 1   esomeprazole  (NEXIUM ) 40 MG capsule Take 1 capsule (40 mg total) by mouth daily. 30 capsule 2   guaiFENesin  (MUCINEX ) 600 MG 12 hr tablet Take 600 mg by mouth daily.     hydrOXYzine  (VISTARIL ) 25 MG capsule Take 1 capsule (25  mg total) by mouth every 8 (eight) hours as needed. 30 capsule 0   PARoxetine  (PAXIL ) 40 MG tablet TAKE 1 TABLET BY MOUTH EVERY DAY 90 tablet 0   propranolol  (INDERAL ) 20 MG tablet Take 1 capsule (60 mg total) by mouth daily. 270 tablet 0   rosuvastatin  (CRESTOR ) 5 MG tablet Take 1 tablet (5 mg total) by mouth 3 (three) times a week. 36 tablet 2   traZODone  (DESYREL ) 100 MG tablet TAKE 1 TABLET (100 MG TOTAL) BY MOUTH AT BEDTIME AS NEEDED. FOR SLEEP. NEED PHYSICAL EXAM FOR FURTHER REFILLS 90 tablet 1   No current facility-administered medications for this visit.    SURGICAL HISTORY:  Past Surgical History:  Procedure Laterality Date   ABDOMINAL HYSTERECTOMY  1986   menorrhagia; hx of cryo for abnormal paps   APPENDECTOMY     removed in 1986 per pt   BIOPSY  10/31/2022   Procedure: BIOPSY;  Surgeon: Wilhelmenia Aloha Raddle., MD;  Location: [facility];  Service: Gastroenterology;;   CATARACT EXTRACTION Bilateral 07/2022   ESOPHAGOGASTRODUODENOSCOPY (EGD) WITH PROPOFOL  N/A 10/31/2022   Procedure: ESOPHAGOGASTRODUODENOSCOPY (EGD) WITH PROPOFOL ;  Surgeon: Wilhelmenia Aloha Raddle., MD;  Location: [facility];  Service: Gastroenterology;  Laterality: N/A;   INTERCOSTAL NERVE BLOCK Right 12/18/2019   Procedure: Intercostal Nerve Block;  Surgeon: Kerrin Elspeth BROCKS, MD;  Location[facility];  Service: Thoracic;  Laterality: Right;   LOBECTOMY Left 03/13/2018   Procedure: Lingula section of left upper lobe lobectomy;  Surgeon: Kerrin Elspeth BROCKS, MD;  Location: [facility] OR;  Service: Thoracic;  Laterality: Left;   NODE DISSECTION  12/18/2019   Procedure: Node Dissection;  Surgeon: Kerrin Elspeth BROCKS, MD;  Location: [facility] OR;  Service: Thoracic;;   SAVORY DILATION N/A 10/31/2022   Procedure: HARLEY HODGKIN;  Surgeon: Wilhelmenia Aloha Raddle., MD;  Location: [facility];  Service: Gastroenterology;  Laterality: N/A;   VIDEO ASSISTED THORACOSCOPY (VATS)/WEDGE RESECTION Left 03/13/2018   Procedure: VIDEO ASSISTED THORACOSCOPY (VATS)/WEDGE RESECTION;  Surgeon: Kerrin Elspeth BROCKS, MD;  Location: [facility] OR;  Service: Thoracic;  Laterality: Left;    REVIEW OF SYSTEMS:  A comprehensive review of systems was negative except for: Respiratory: positive for dyspnea on exertion     LABORATORY DATA: Lab Results  Component Value Date   WBC 6.8 07/01/2024   HGB 14.3 07/01/2024   HCT 43.4 07/01/2024   MCV 82.8 07/01/2024   PLT 307 07/01/2024      Chemistry      Component Value Date/Time   NA 138 07/01/2024 1008   K 4.6 07/01/2024 1008   CL 100 07/01/2024 1008   CO2 32 07/01/2024 1008   BUN 16 07/01/2024 1008   CREATININE 0.95 07/01/2024 1008   CREATININE 0.73 10/27/2021 1543      Component Value Date/Time   CALCIUM  9.5 07/01/2024 1008   ALKPHOS 94 07/01/2024 1008   AST 23 07/01/2024 1008   ALT 18 07/01/2024 1008   ALT 20 10/10/2017 1653   BILITOT 0.8 07/01/2024 1008       RADIOGRAPHIC STUDIES: CT Chest W Contrast Result Date: 07/01/2024 CLINICAL DATA:  Non-small cell lung cancer (NSCLC), non-metastatic, assess treatment response. * Tracking Code: BO *  EXAM: CT CHEST WITH CONTRAST TECHNIQUE: Multidetector CT imaging of the chest was performed during intravenous contrast administration. RADIATION DOSE REDUCTION: This exam was performed according to the departmental dose-optimization program which includes automated exposure control, adjustment of the mA and/or kV according to patient size and/or use of iterative reconstruction technique. CONTRAST:  75mL OMNIPAQUE  IOHEXOL  300 MG/ML  SOLN COMPARISON:  CT scan chest from 11/28/2023. FINDINGS: Cardiovascular: Normal cardiac size. No pericardial effusion. No aortic aneurysm. There are mild peripheral atherosclerotic vascular calcifications of thoracic aorta and its major branches. Mediastinum/Nodes: Visualized thyroid  gland appears grossly unremarkable. No solid / cystic mediastinal masses. The esophagus is nondistended precluding optimal assessment. There are few mildly prominent mediastinal and hilar lymph nodes, which do not meet the size criteria for lymphadenopathy and appear grossly similar to the prior study, favoring benign etiology. No axillary or hilar lymphadenopathy by size criteria. Lungs/Pleura: The central tracheo-bronchial tree is patent. Mild-to-moderate upper lobe predominant centrilobular emphysematous changes noted. Redemonstration of postsurgical scarring in the bilateral lower lobes. No new mass or consolidation. No pleural effusion or pneumothorax. No suspicious lung nodules. Upper Abdomen: Is a tiny hernia. There is diffuse hepatic steatosis. Remaining visualized upper abdominal viscera within normal limits. Musculoskeletal: The visualized soft tissues of the chest wall are grossly unremarkable. No suspicious osseous lesions. There are mild multilevel degenerative changes in the visualized spine. IMPRESSION: 1. No metastatic disease identified within the chest. 2. Multiple other nonacute observations, as described above. Aortic Atherosclerosis (ICD10-I70.0) and Emphysema (ICD10-J43.9).  Electronically Signed   By: Ree Molt M.D.   On: 07/01/2024 13:15    ASSESSMENT AND PLAN: This is a very pleasant 68 years old white female diagnosed initially with stage Ib non-small cell lung cancer, adenocarcinoma in June 2019 as well as a stage Ia non-small cell lung cancer, adenocarcinoma in March 2021 status post surgical resection and has been on observation.  Assessment and Plan Assessment & Plan History of left upper lobe and right lower lobe lung adenocarcinoma, post-resection Stage 1B non-small cell lung cancer, adenocarcinoma, diagnosed in June 2019. Status post wedge resection of left upper lobe lung nodule and right lower lobe segmentectomy in 2019 and 2021, respectively. Currently on observation with no evidence of metastatic disease on recent CT scan. - Continue observation with annual CT scan in one year.  Chronic hypoxemia requiring supplemental oxygen  Chronic hypoxemia managed with supplemental oxygen  at 3-5 liters per minute. No new symptoms reported. - Continue supplemental oxygen  therapy at 3-5 liters per minute.  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 No new complaints or changes in symptoms.  I discussed the assessment and treatment plan with the patient. The patient was provided an opportunity to ask questions and all were answered. The patient agreed with the plan and demonstrated an understanding of the instructions.   The patient was advised to call back or seek an in-person evaluation if the symptoms worsen or if the condition fails to improve as anticipated.  I provided 20 minutes of face-to-face video visit time during this encounter, and > 50% was spent counseling as documented under my assessment & plan.  Sherrod MARLA Sherrod, MD 07/02/2024 11:15 AM  Disclaimer: This note was dictated with voice recognition software. Similar  sounding words can inadvertently be transcribed and may not be corrected upon review.

## 2024-07-08 DIAGNOSIS — M79641 Pain in right hand: Secondary | ICD-10-CM | POA: Diagnosis not present

## 2024-07-08 DIAGNOSIS — M79644 Pain in right finger(s): Secondary | ICD-10-CM | POA: Diagnosis not present

## 2024-07-15 DIAGNOSIS — J441 Chronic obstructive pulmonary disease with (acute) exacerbation: Secondary | ICD-10-CM | POA: Diagnosis not present

## 2024-07-21 ENCOUNTER — Ambulatory Visit: Admitting: Physician Assistant

## 2024-07-27 ENCOUNTER — Ambulatory Visit: Payer: Self-pay | Admitting: Internal Medicine

## 2024-07-27 NOTE — Telephone Encounter (Signed)
 FYI Only or Action Required?: FYI only for provider.  Patient is followed in Pulmonology for COPD, last seen on 05/05/2024 by Darlean Ozell NOVAK, MD.  Called Nurse Triage reporting Cough.  Symptoms began several days ago.  Interventions attempted: Maintenance inhaler and Nebulizer treatments.  Symptoms are: unchanged.  Triage Disposition: See Physician Within 24 Hours  Patient/caregiver understands and will follow disposition?: Yes      Copied from CRM #[id number]. Topic: Clinical - Red Word Triage >> Jul 27, 2024 12:15 PM [name] wrote: [facility] that prompted transfer to Nurse Triage: Experiencing chest tighteness, cough, low oxygen  - any activity she does on 4 her oxygen  drops to 93, patient states she has sever COPD.     E2C2 Pulmonary Triage - Initial Assessment Questions "Chief Complaint (e.g., cough, sob, wheezing, fever, chills, sweat or additional symptoms) *Go to specific symptom protocol after initial questions. Cough  "How long have symptoms been present?" 4-5 days  Have you tested for COVID or Flu? Note: If not, ask patient if a home test can be taken. If so, instruct patient to call back for positive results. No  MEDICINES:   "Have you used any OTC meds to help with symptoms?" Yes If yes, ask "What medications?" Mucinex   "Have you used your inhalers/maintenance medication?" Yes If yes, "What medications?" Albuterol  nebulizer   If inhaler, ask "How many puffs and how often?" Note: Review instructions on medication in the chart. Using Breztri  as   OXYGEN : "Do you wear supplemental oxygen ?" Yes If yes, "How many liters are you supposed to use?" 3L but is on 4L  "Do you monitor your oxygen  levels?" Yes If yes, What is your reading (oxygen  level) today? 98%        Reason for Disposition  [1] Known COPD or other severe lung disease (i.e., bronchiectasis, cystic fibrosis, lung surgery) AND [2] symptoms getting worse (i.e., increased sputum purulence  or amount, increased breathing difficulty  Answer Assessment - Initial Assessment Questions 1. ONSET: When did the cough begin?      4-5 days ago  2. SEVERITY: How bad is the cough today?      Moderate  3. SPUTUM: Describe the color of your sputum (e.g., none, dry cough; clear, white, yellow, green)     Clear  4. HEMOPTYSIS: Are you coughing up any blood? If Yes, ask: How much? (e.g., flecks, streaks, tablespoons, etc.)     No 5. DIFFICULTY BREATHING: Are you having difficulty breathing? If Yes, ask: How bad is it? (e.g., mild, moderate, severe)      Mild  6. FEVER: Do you have a fever? If Yes, ask: What is your temperature, how was it measured, and when did it start?     No 7. CARDIAC HISTORY: Do you have any history of heart disease? (e.g., heart attack, congestive heart failure)      Yes 8. LUNG HISTORY: Do you have any history of lung disease?  (e.g., pulmonary embolus, asthma, emphysema)     Yes 9. PE RISK FACTORS: Do you have a history of blood clots? (or: recent major surgery, recent prolonged travel, bedridden)     No 10. OTHER SYMPTOMS: Do you have any other symptoms? (e.g., runny nose, wheezing, chest pain)       No  Protocols used: Cough - Acute Productive-A-AH

## 2024-07-29 ENCOUNTER — Ambulatory Visit (INDEPENDENT_AMBULATORY_CARE_PROVIDER_SITE_OTHER): Admitting: Internal Medicine

## 2024-07-29 ENCOUNTER — Encounter: Payer: Self-pay | Admitting: Internal Medicine

## 2024-07-29 VITALS — BP 130/80 | HR 77 | Temp 98.0°F | Ht 64.0 in | Wt 165.0 lb

## 2024-07-29 DIAGNOSIS — J449 Chronic obstructive pulmonary disease, unspecified: Secondary | ICD-10-CM

## 2024-07-29 DIAGNOSIS — J9611 Chronic respiratory failure with hypoxia: Secondary | ICD-10-CM | POA: Diagnosis not present

## 2024-07-29 MED ORDER — AZITHROMYCIN 250 MG PO TABS: ORAL_TABLET | ORAL | 0 refills | Status: DC

## 2024-07-29 MED ORDER — PREDNISONE 10 MG PO TABS: ORAL_TABLET | ORAL | 0 refills | Status: DC

## 2024-07-29 NOTE — Progress Notes (Signed)
 [name], female    DOB: [birth year],    MRN: [id number]   Brief patient profile:  13 yowf  quit smoking 2019/MM p started albuterol   inhalers around 2014 then added symbicort  changed to wixella  due to cost referred to pulmonary clinic 06/29/2021 by Dr Koberlein  who recently changed to breztri  which seemed to helped even more.   PFTs 12/14/19 c/w GOLD 3 copd   04/12/21 oncology note DIAGNOSIS:  1) Stage Ib (T2, N0, M0) non-small cell lung cancer, adenocarcinoma diagnosed in June 2019. 2) stage Ia (T1 a, N0, M0) non-small cell lung cancer, adenocarcinoma status post   PRIOR THERAPY:  1) status post wedge resection of the left upper lobe nodule in addition to lingular segmentectomy and mediastinal lymph node sampling under the care of Dr. Kerrin on March 13, 2018.  Right lower lobe segmentectomy under the care of Dr. Kerrin on December 18, 2019.   CURRENT THERAPY: Observation  History of Present Illness  06/29/2021  Pulmonary/ 1st office eval/Melchor Kirchgessner  Chief Complaint  Patient presents with   Pulmonary Consult     Referred by Dr. Junell Kuberlein. Pt has hx of lung ca and she was told back in 2018 she has COPD. She gets winded just walking around her house. She had PNA approx 2 months.    Dyspnea:  one room to the next / shops on line  Cough: prednisone  helped still some residual dry cough  Sleep: on side / electric bed 30 degrees  SABA use: one bid hfa/ neb once daily  Rec Plan A = Automatic = Always=    wixella one twice daily  = trelegy 100 once daily  or Breztri  2 puff every 12 hours  Breathe out thru nose  Plan B = Backup (to supplement plan A, not to replace it) Only use your albuterol  inhaler as a rescue medication  Plan C = Crisis (instead of Plan B but only if Plan B stops working) - only use your albuterol  nebulizer if you first try Plan B and it fails to help Call we the cheapest alternatives  for example wixellla / incruse   = trelegy      08/14/2022  f/u  ov/Shanquita Ronning re: gold 3 maint on breztri   / could not tol bisoprolol  due to shaking but helped breathing  Chief Complaint  Patient presents with   Follow-up    Breathing has improved slightly. She has not used her albuterol  inhaler in the past wk. She has non prod cough.    Dyspnea:  around the house  Cough: sometimes with coughing  Sleeping: 6 in bed blocks on side  SABA use: rarely needing hfa/ much less neb  02 has it, not using,sats usually > 90% walking on RA though very sedentary they fired me from rehab Rec Please remember to go to the lab department   for your tests - we will call you with the results when they are available. Work on inhaler technique:  My office will be contacting you by phone for referral to pulmonary rehab> done - says failed though able to bicycle fine     02/12/2023  f/u ov/Slayde Brault re: GOLD 3 copd maint on breztri    Chief Complaint  Patient presents with   Follow-up    Cough and SOB with exertion persistent.  Dyspnea:  walking to Bathroom and back is about her limit  Cough: not productive  Sleeping:30 degrees electric bed  SABA use: albuterol  before ex made no difference /  02: not using at all Recs Make sure you check your oxygen  saturation  AT  your highest level of activity (not after you stop)   to be sure it stays over 90%   Pace yourself aiming for 30 min of sustained exercise at a level where you are short of breath but not out of breath. Ask your neurologist about weaning off Inderal  Work on inhaler technique:   04/24/23 Aecopd > rx doxy/ pred by [facility]    08/28/2023 41m  f/u ov/Cyla Haluska re: GOLD 3 copd   maint on breztri    Chief Complaint  Patient presents with   Follow-up    SOB, cough, wheeze x 2 weeks.  Dyspnea:  room to room  Cough: mucus is white  Sleeping: 30 degrees electric  resp cc  SABA use: up to once or twice daily  hfa/ neb not today  02: none  Lung cancer screening :  07/15/23   Rec Prednisone  10 mg take  4 each am x 2 days,   2  each am x 2 days,  1 each am x 2 days and stop Plan A = Automatic = Always=    Breztri    Work on inhaler technique:  Plan B = Backup (to supplement plan A, not to replace it) Only use your albuterol  inhaler as a rescue medication Plan C = Crisis (instead of Plan B but only if Plan B stops working) - only use your albuterol  nebulizer if you first try Plan B  Also  Ok to try albuterol  15 min before an activity (on alternating days)  that you know would usually make you short of breath       05/05/2024  f/u ov/Jerimy Johanson re: COPD GOLD 3/ now 02 dep    maint on BREZTRI   / starting dupixent  at time of OV Chief Complaint  Patient presents with   COPD    Sob has worsened and dry cough green/white mucus.  Dyspnea:  room to room at home out of breath  Cough: mostly in am's all colors  Sleeping: 30 degrees hob with one pillow  resp cc  SABA use: just using the neb each am  02: 2lpm does not titate / has POC but not working and on back up tank Lung cancer screening :  per dr Emery   Rec Make sure you check your oxygen  saturation  AT  your highest level of activity (not after you stop)   to be sure it stays over 90%  Work on inhaler technique:    Duxpient every 2 weeks  Please schedule a follow up visit in 3 months but call sooner if needed - bring your inhalers and spacer if you want to use it but be consistent    07/29/2024  f/u ov/Johngabriel Verde re: COPD GOLD 3 /now 02    maint on breztri /  dupixent   Chief Complaint  Patient presents with   Acute Visit    Increased cough x 2 wks- prod with green to clear sputum. She has had some wheezing and chest tightness.    Dyspnea:  walking to car dragging the large 02 tank on [place]  behind her / also limited by tremors  Cough: variably productive / green  already mucinex  /not using flutter valve  Sleeping: 30 degrees hob / one pillow s noct  resp cc  SABA use: neb q4 hours since last 2 weeks despite dupixent / much better only while on prednisone   02: 3pm hs and  all day  Lung cancer screening :  per Mohammed    No obvious day to day or daytime variability or assoc  mucus plugs or hemoptysis or cp or  subjective overt sinus or hb symptoms.   Also denies any obvious fluctuation of symptoms with weather or environmental changes or other aggravating or alleviating factors except as outlined above   No unusual exposure hx or h/o childhood pna/ asthma or knowledge of premature birth.  Current Allergies, Complete Past Medical History, Past Surgical History, Family History, and Social History were reviewed in [employer] record.  ROS  The following are not active complaints unless bolded Hoarseness, sore throat, dysphagia, dental problems, itching, sneezing,  nasal congestion or discharge of excess mucus or purulent secretions, ear ache,   fever, chills, sweats, unintended wt loss or wt gain, classically pleuritic or exertional cp,  orthopnea pnd or arm/hand swelling  or leg swelling, presyncope, palpitations, abdominal pain, anorexia, nausea, vomiting, diarrhea  or change in bowel habits or change in bladder habits, change in stools or change in urine, dysuria, hematuria,  rash, arthralgias, visual complaints, headache, numbness, weakness or ataxia or problems with walking or coordination,  change in mood or  memory. Tremor on Inderal          Current Meds  Medication Sig   albuterol  (PROVENTIL ) (2.5 MG/3ML) 0.083% nebulizer solution TAKE 3 ML (2.5 MG TOTAL) BY NEBULIZATION EVERY 4 HOURS AS NEEDED FOR WHEEZING OR SHORTNESS OF BREATH   albuterol  (VENTOLIN  HFA) 108 (90 Base) MCG/ACT inhaler Inhale 2 puffs into the lungs every 4 (four) hours as needed for wheezing or shortness of breath.   Budeson-Glycopyrrol-Formoterol  (BREZTRI  AEROSPHERE) 160-9-4.8 MCG/ACT AERO Inhale 2 puffs into the lungs 2 (two) times daily.   clonazePAM  (KLONOPIN ) 0.5 MG tablet Take 1 tablet (0.5 mg total) by mouth 3 (three) times daily as needed. for anxiety    Dupilumab  (DUPIXENT ) 300 MG/2ML SOAJ Inject 300 mg into the skin every 14 (fourteen) days.   esomeprazole  (NEXIUM ) 40 MG capsule Take 1 capsule (40 mg total) by mouth daily.   guaiFENesin  (MUCINEX ) 600 MG 12 hr tablet Take 600 mg by mouth daily.   hydrOXYzine  (VISTARIL ) 25 MG capsule Take 1 capsule (25 mg total) by mouth every 8 (eight) hours as needed.   PARoxetine  (PAXIL ) 40 MG tablet TAKE 1 TABLET BY MOUTH EVERY DAY   propranolol  (INDERAL ) 20 MG tablet Take 1 capsule (60 mg total) by mouth daily.   rosuvastatin  (CRESTOR ) 5 MG tablet Take 1 tablet (5 mg total) by mouth 3 (three) times a week.   traZODone  (DESYREL ) 100 MG tablet TAKE 1 TABLET (100 MG TOTAL) BY MOUTH AT BEDTIME AS NEEDED. FOR SLEEP. 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    Hepatitis C    lung ca dx'd 03/2018   Tremor, unspecified    non-specific; takes inderal .          Objective:     Wts  07/29/2024     165   05/05/2024         160   08/28/2023  160  02/12/2023       156  08/14/2022      150  04/23/2022       156   03/07/2022        156   09/07/21 125 lb (56.7 kg)  06/29/21 120 lb (54.4 kg)  06/07/21 119 lb 11.2 oz (54.3 kg)    Vital signs reviewed  07/29/2024  - Note at rest 02 sats  96% on 4lpm    General appearance:    w/c bound chronically ill appearing amb wf nad     HEENT : Oropharynx  clear   N    NECK :  without  apparent JVD/ palpable Nodes/TM    LUNGS: no acc muscle use,  Mild barrel / kyphotic  contour chest wall with bilateral  Distant exp wheeze and  without cough on insp or exp maneuvers  and mild  Hyperresonant  to  percussion bilaterally     CV:  RRR  no s3 or murmur or  increase in P2, and no edema   ABD:  soft and nontender   MS:  ext warm without deformities Or obvious joint restrictions  calf tenderness, cyanosis or clubbing     SKIN: warm and dry without lesions    NEURO:  alert, approp, nl sensorium with  no motor or cerebellar deficits apparent.      I personally reviewed images and agree with radiology impression as follows:   Chest CT with contrast   07/01/24   The central tracheo-bronchial tree is patent. Mild-to-moderate upper lobe predominant centrilobular emphysematous changes noted. Redemonstration of postsurgical scarring in the bilateral lower lobes. No new mass or consolidation. No pleural effusion or pneumothorax. No suspicious lung nodules.     Assessment   Assessment & Plan COPD GOLD 3  Quit smoking 2019/MM - PFT's  12/14/19 FEV1 1.02 (40 % ) ratio 0.39  p 20 % improvement from saba p ? prior to study with DLCO  10.06 (48%) corrects to 2.32 (55%)  for alv volume and FV curve classic concave exp loop - 06/29/2021  After extensive coaching inhaler device,  effectiveness =    90% with dpi, 75% with hfa   - 06/29/2021   Walked on RA x  3  lap(s) =  approx 750 ft @ mod fast pace, stopped due to end of study, sob  with lowest 02 sats 93% - 04/23/2022 rec change propranolol  to bisoprolol   - Labs ordered 08/14/2022  :    alpha one AT phenotype  MM  149  - 02/12/2023   continue breztri  -  08/28/2023  After extensive coaching inhaler device,  effectiveness =   80% (short ti) > continue breztri  and more approp saba - 05/05/2024  After extensive coaching inhaler device,  effectiveness =    75% (short ti) > keep working on hfa/ use spacer if not able to master - Chest CT with contrast   07/01/24 Mild-to-moderate upper lobe predominant centrilobular emphysema   - 05/05/2024 started on Dupixent  >>> done @  end November 2025 s apparent benefit so plan to d/c  >>>Ohtuvayre   07/29/2024     Comment   Group E in terms of symptoms/risk so  laba/lama/ICS   therefore appropriate rx at this point >>>  breztri  plus prednionse  and using approp SABA prn but can't stay much better without pred despite dupixent  so time to challenge with ohtuvayre  nebs bid   Discussed in detail all the  indications, usual  risks  and alternatives  relative to the benefits with patient who agrees to proceed with Rx as outlined.          Chronic respiratory failure with hypoxia (HCC) 05/05/2024    sats 94% on 2lpm but desaturated walking slow pace and up to 6lpm > referred for best fit but not a candidate for POC   Advised on goals of  02 sats > 90% at all times          Each maintenance medication was reviewed in detail including emphasizing most importantly the difference between maintenance and prns and under what circumstances the prns are to be triggered using an action plan format where appropriate.  Total time for H and P, chart review, counseling, reviewing hfa/ neb 02 / pulse ox  device(s) and generating customized AVS unique to this office visit / same day charting = 35 min          AVS  Patient Instructions  Zpak  Prednisone  10 mg take  4 each am x 2 days,   2 each am x 2 days,  1 each am x 2 days and stop   We start order for Ohtuvayre  twice daily   Please schedule a follow up visit in 6  months but call sooner if needed      Ozell America, MD 07/29/2024

## 2024-07-29 NOTE — Patient Instructions (Signed)
 Zpak  Prednisone  10 mg take  4 each am x 2 days,   2 each am x 2 days,  1 each am x 2 days and stop   We start order for Ohtuvayre  twice daily   Please schedule a follow up visit in 6  months but call sooner if needed

## 2024-07-29 NOTE — Assessment & Plan Note (Addendum)
 Quit smoking 2019/MM - PFT's  12/14/19 FEV1 1.02 (40 % ) ratio 0.39  p 20 % improvement from saba p ? prior to study with DLCO  10.06 (48%) corrects to 2.32 (55%)  for alv volume and FV curve classic concave exp loop - 06/29/2021  After extensive coaching inhaler device,  effectiveness =    90% with dpi, 75% with hfa   - 06/29/2021   Walked on RA x  3  lap(s) =  approx 750 ft @ mod fast pace, stopped due to end of study, sob  with lowest 02 sats 93% - 04/23/2022 rec change propranolol  to bisoprolol   - Labs ordered 08/14/2022  :    alpha one AT phenotype  MM  149  - 02/12/2023   continue breztri  -  08/28/2023  After extensive coaching inhaler device,  effectiveness =   80% (short ti) > continue breztri  and more approp saba - 05/05/2024  After extensive coaching inhaler device,  effectiveness =    75% (short ti) > keep working on hfa/ use spacer if not able to master - Chest CT with contrast   07/01/24 Mild-to-moderate upper lobe predominant centrilobular emphysema   - 05/05/2024 started on Dupixent  >>> done @  end November 2025 s apparent benefit so plan to d/c  >>>Ohtuvayre   07/29/2024     Comment   Group E in terms of symptoms/risk so  laba/lama/ICS  therefore appropriate rx at this point >>>  breztri  plus prednionse  and using approp SABA prn but can't stay much better without pred despite dupixent  so time to challenge with ohtuvayre  nebs bid   Discussed in detail all the  indications, usual  risks and alternatives  relative to the benefits with patient who agrees to proceed with Rx as outlined.

## 2024-07-29 NOTE — Assessment & Plan Note (Addendum)
 05/05/2024    sats 94% on 2lpm but desaturated walking slow pace and up to 6lpm > referred for best fit but not a candidate for POC   Advised on goals of  02 sats > 90% at all times          Each maintenance medication was reviewed in detail including emphasizing most importantly the difference between maintenance and prns and under what circumstances the prns are to be triggered using an action plan format where appropriate.  Total time for H and P, chart review, counseling, reviewing hfa/ neb 02 / pulse ox  device(s) and generating customized AVS unique to this office visit / same day charting = 35 min

## 2024-07-30 ENCOUNTER — Telehealth: Payer: Self-pay

## 2024-07-30 NOTE — Telephone Encounter (Signed)
 Received Ohtuvayre  new start paperwork. Completed form and faxed with clinicals and insurance card copy to [facility]   Phone#: [phone] Fax#: [phone]

## 2024-07-31 NOTE — Telephone Encounter (Signed)
 Received fax from [employer] confirming receipt of Ohtuvayre  enrollment form  Patient ID: [id number]

## 2024-08-03 NOTE — Telephone Encounter (Signed)
 Received fax from [facility] confirming receipt of rx.

## 2024-08-03 NOTE — Telephone Encounter (Signed)
 Received fax from [employer] with summary of benefits. Referral form for Ohtuvayre  received. Rx will be triaged to [facility].. Once benefits investigation completed, pharmacy will reach out the patient to schedule shipment. If medication is unaffordable, patient will need to express financial hardship to be referred back to [facility] for patient assistance program pre-screening.   Patient ID: [id number] Pharmacy phone: [phone] [facility] Phone#: [phone]

## 2024-08-04 NOTE — Telephone Encounter (Signed)
 Received fax from [facility] with prior authorization form pre-populated requesting signature. Signed and faxed to [phone] as requested.

## 2024-08-05 NOTE — Telephone Encounter (Signed)
 Received a fax regarding Prior Authorization from [employer] for OHTUVAYRE . Authorization has been DENIED because medication is already covered under part B.  Phone# [phone]  Denial letter has been faxed to [employer] and [facility].

## 2024-08-06 NOTE — Progress Notes (Unsigned)
 Chief Complaint: Hiatal hernia and dysphagia  HPI:    Mrs. [name] is a 68 year old female, known to Dr. Wilhelmenia, with past medical history as listed below including echocardiogram 02/13/2024 with LVEF 60-65%, COPD and lung cancer, who was referred to me by Merna Huxley, NP for a complaint of hiatal hernia and dysphagia.      10/31/2022 EGD done at [facility] with no gross lesions in the esophagus, salmon-colored mucosal islands in the very distal esophagus suspicious for short-segment Barrett's, Z-line irregular at 35 cm, nonobstructing Schatzki's ring disrupted, after completion EGD is savory dilation to 60 mm performed with moderate resistance leading to mucosal rent just before the UES, 4 cm hiatal hernia, erythematous mucosa in the stomach.  Biopsy showed gastric antral oxyntic mucosa with features of reactive gastropathy and otherwise no Barrett's.    07/01/2024 CT of the chest with contrast showed no metastatic disease, multiple other nonacute observations-no mention of hiatal hernia.    07/01/2024 CBC was normal.  CMP with a glucose of 104 and otherwise normal.    07/29/2024 patient saw pulmonology to follow-up on lung cancer and noted to be status post wedge resection of the left upper lobe in 2019.  Right lower lobe in 2021.  At that time given a Z-Pak and prednisone .  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   Adenocarcinoma of right lung, 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   Allergic rhinitis 10/27/2021   Allergy    Anemia    Anxiety    Arthritis of hand 04/17/2017   Bronchitis    CAP (community acquired pneumonia) 10/01/2023   Cataract    Chest pain 10/09/2022   Chronic bronchitis (HCC) 02/04/2018   Chronic respiratory failure with hypoxia (HCC) 10/06/2021   Chronic rhinitis 10/06/2021   Cirrhosis (HCC) 11/13/2017   Colon cancer  screening 10/09/2022   Community acquired pneumonia 10/03/2023   COPD GOLD 3  06/29/2021   Quit smoking 2019/MM  - PFT's  12/14/19 FEV1 1.02 (40 % ) ratio 0.39  p 20 % improvement from saba p ? prior to study with DLCO  10.06 (48%) corrects to 2.32 (55%)  for alv volume and FV curve classic concave exp loop  - 06/29/2021  After extensive coaching inhaler device,  effectiveness =    90% with dpi, 75% with hfa    - 06/29/2021   Walked on RA x  3  lap(s) =  approx 750 ft @ mod fast pace, stop   Depression    Depression, major, single episode, moderate (HCC) 03/19/2008   Qualifier: Diagnosis of   By: Jame  MD, Maude FALCON        Dyspnea    Emphysema of lung (HCC) 03/13/2018   Emphysema, unspecified (HCC)    per patient mild case   Epigastric pain 10/09/2022   Esophageal dysphagia 05/11/2023   Family history of adverse reaction to anesthesia    patient states sister has a hard time waking up from anesthesia.    GERD 03/19/2008   Qualifier: Diagnosis of   By: Jame  MD, Maude FALCON        Grief reaction 01/24/2022   Hepatitis B core antibody positive 10/09/2022   Hepatitis C    Hepatitis C, chronic (HCC) 03/14/2015   Hiatal hernia 10/09/2022   History of hepatitis C 10/09/2022   Left upper  lobe pulmonary nodule 02/04/2018   IMPRESSION:  1. Negative for acute pulmonary embolus or aortic dissection  2. 14 mm left upper lobe pulmonary nodule, increased in size  compared to prior and therefore felt suspicious for carcinoma.  Consider correlation with PET-CT and/or tissue sampling.  3. Mild emphysema     Aortic Atherosclerosis (ICD10-I70.0) and Emphysema (ICD10-J43.9).        Electronically Signed    By: Luke Bun M.D   lung ca dx'd 03/2018   Lung mass 03/13/2018   Migraine headache 08/01/2010   Qualifier: Diagnosis of   By: Jame  MD, Maude FALCON        Mitral stenosis    NEPHROLITHIASIS, HX OF 03/19/2008   Qualifier: Diagnosis of   By: Jame  MD, Maude FALCON        Nonrheumatic mitral  (valve) stenosis    Oxygen  deficiency    Palpitations 02/13/2024   Pill dysphagia 10/09/2022   Pleural effusion 10/06/2021   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   SOB (shortness of breath) 09/18/2021   Tobacco use disorder 01/21/2014   Tremor 10/14/2012   Tremor, unspecified    non-specific; takes inderal .    Vaccine counseling 11/13/2017    Past Surgical History:  Procedure Laterality Date   ABDOMINAL HYSTERECTOMY  1986   menorrhagia; hx of cryo for abnormal paps   APPENDECTOMY     removed in 1986 per pt   BIOPSY  10/31/2022   Procedure: BIOPSY;  Surgeon: Wilhelmenia Aloha Raddle., MD;  Location: [facility];  Service: Gastroenterology;;   CATARACT EXTRACTION Bilateral 07/2022   ESOPHAGOGASTRODUODENOSCOPY (EGD) WITH PROPOFOL  N/A 10/31/2022   Procedure: ESOPHAGOGASTRODUODENOSCOPY (EGD) WITH PROPOFOL ;  Surgeon: Wilhelmenia Aloha Raddle., MD;  Location: [facility];  Service: Gastroenterology;  Laterality: N/A;   INTERCOSTAL NERVE BLOCK Right 12/18/2019   Procedure: Intercostal Nerve Block;  Surgeon: Kerrin Elspeth BROCKS, MD;  Location: [facility] OR;  Service: Thoracic;  Laterality: Right;   LOBECTOMY Left 03/13/2018   Procedure: Lingula section of left upper lobe lobectomy;  Surgeon: Kerrin Elspeth BROCKS, MD;  Location: [facility] OR;  Service: Thoracic;  Laterality: Left;   NODE DISSECTION  12/18/2019   Procedure: Node Dissection;  Surgeon: Kerrin Elspeth BROCKS, MD;  Location: [facility] OR;  Service: Thoracic;;   SAVORY DILATION N/A 10/31/2022   Procedure: HARLEY HODGKIN;  Surgeon: Wilhelmenia Aloha Raddle., MD;  Location: [facility];  Service: Gastroenterology;  Laterality: N/A;   VIDEO ASSISTED THORACOSCOPY (VATS)/WEDGE RESECTION Left 03/13/2018   Procedure: VIDEO ASSISTED THORACOSCOPY (VATS)/WEDGE RESECTION;  Surgeon: Kerrin Elspeth BROCKS, MD;  Location: [facility] OR;  Service: Thoracic;  Laterality: Left;    Current Outpatient Medications  Medication Sig Dispense Refill   albuterol  (PROVENTIL )  (2.5 MG/3ML) 0.083% nebulizer solution TAKE 3 ML (2.5 MG TOTAL) BY NEBULIZATION EVERY 4 HOURS AS NEEDED FOR WHEEZING OR SHORTNESS OF BREATH 300 mL 3   albuterol  (VENTOLIN  HFA) 108 (90 Base) MCG/ACT inhaler Inhale 2 puffs into the lungs every 4 (four) hours as needed for wheezing or shortness of breath. 18 g 11   azithromycin  (ZITHROMAX ) 250 MG tablet Take 2 on day one then 1 daily x 4 days 6 tablet 0   Budeson-Glycopyrrol-Formoterol  (BREZTRI  AEROSPHERE) 160-9-4.8 MCG/ACT AERO Inhale 2 puffs into the lungs 2 (two) times daily. 3 each 4   clonazePAM  (KLONOPIN ) 0.5 MG tablet Take 1 tablet (0.5 mg total) by mouth 3 (three) times daily as needed. for anxiety 90 tablet 3   Dupilumab  (DUPIXENT ) 300 MG/2ML SOAJ Inject 300  mg into the skin every 14 (fourteen) days. 12 mL 1   esomeprazole  (NEXIUM ) 40 MG capsule Take 1 capsule (40 mg total) by mouth daily. 30 capsule 2   guaiFENesin  (MUCINEX ) 600 MG 12 hr tablet Take 600 mg by mouth daily.     hydrOXYzine  (VISTARIL ) 25 MG capsule Take 1 capsule (25 mg total) by mouth every 8 (eight) hours as needed. 30 capsule 0   PARoxetine  (PAXIL ) 40 MG tablet TAKE 1 TABLET BY MOUTH EVERY DAY 90 tablet 0   predniSONE  (DELTASONE ) 10 MG tablet Take  4 each am x 2 days,   2 each am x 2 days,  1 each am x 2 days and stop 14 tablet 0   propranolol  (INDERAL ) 20 MG tablet Take 1 capsule (60 mg total) by mouth daily. 270 tablet 0   rosuvastatin  (CRESTOR ) 5 MG tablet Take 1 tablet (5 mg total) by mouth 3 (three) times a week. 36 tablet 2   traZODone  (DESYREL ) 100 MG tablet TAKE 1 TABLET (100 MG TOTAL) BY MOUTH AT BEDTIME AS NEEDED. FOR SLEEP. NEED PHYSICAL EXAM FOR FURTHER REFILLS 90 tablet 1   No current facility-administered medications for this visit.    Allergies as of 08/07/2024 - Review Complete 07/29/2024  Allergen Reaction Noted   Aspirin Shortness Of Breath 03/21/2007   Trelegy ellipta  [fluticasone -umeclidin-vilant] Shortness Of Breath 10/27/2021   Morphine sulfate  Other (See Comments) 12/23/2006   Oxycodone  Itching and Rash 04/15/2018   Hydrocodone  Itching 10/01/2023   Penicillins Other (See Comments) 09/03/2017   Sulfa antibiotics Tinitus 09/03/2017    Family History  Problem Relation Age of Onset   Asthma Mother    Alzheimer's disease Mother 54   Hypertension Mother    Other Father        shot    Neuropathy Sister        face   Cancer Maternal Aunt        breast   Cancer Paternal Aunt    Lung cancer Paternal Uncle    Cancer Paternal Grandmother        abdominal   Colon cancer Neg Hx    Esophageal cancer Neg Hx    Inflammatory bowel disease Neg Hx    Liver disease Neg Hx    Pancreatic cancer Neg Hx    Rectal cancer Neg Hx    Stomach cancer Neg Hx    Tremor Neg Hx    Parkinson's disease Neg Hx     Social History   Socioeconomic History   Marital status: Widowed    Spouse name: Not on file   Number of children: Not on file   Years of education: Not on file   Highest education level: 12th grade  Occupational History   Not on file  Tobacco Use   Smoking status: Former    Current packs/day: 0.00    Average packs/day: 1 pack/day for 40.0 years (40.0 ttl pk-yrs)    Types: Cigarettes    Start date: 12/14/1977    Quit date: 12/14/2017    Years since quitting: 6.6    Passive exposure: Never   Smokeless tobacco: Never  Vaping Use   Vaping status: Never Used  Substance and Sexual Activity   Alcohol use: Not Currently    Comment: occasional bourbon   Drug use: Not Currently   Sexual activity: Never  Other Topics Concern   Not on file  Social History Narrative   Pt has roommate    Retired  Social Drivers of Corporate Investment Banker Strain: Low Risk  (06/07/2024)   Overall Financial Resource Strain (CARDIA)    Difficulty of Paying Living Expenses: Not very hard  Food Insecurity: No Food Insecurity (06/07/2024)   Hunger Vital Sign    Worried About Running Out of Food in the Last Year: Never true    Ran Out of Food in  the Last Year: Never true  Transportation Needs: No Transportation Needs (06/07/2024)   PRAPARE - Administrator, Civil Service (Medical): No    Lack of Transportation (Non-Medical): No  Physical Activity: Unknown (06/07/2024)   Exercise Vital Sign    Days of Exercise per Week: Patient declined    Minutes of Exercise per Session: Not on file  Stress: Stress Concern Present (06/07/2024)   [employer] of Occupational Health - Occupational Stress Questionnaire    Feeling of Stress: Rather much  Social Connections: Socially Isolated (06/07/2024)   Social Connection and Isolation Panel    Frequency of Communication with Friends and Family: Three times a week    Frequency of Social Gatherings with Friends and Family: Once a week    Attends Religious Services: Patient declined    Database Administrator or Organizations: No    Attends Engineer, Structural: Not on file    Marital Status: Widowed  Intimate Partner Violence: Not At Risk (11/01/2023)   Humiliation, Afraid, Rape, and Kick questionnaire    Fear of Current or Ex-Partner: No    Emotionally Abused: No    Physically Abused: No    Sexually Abused: No    Review of Systems:    Constitutional: No weight loss, fever, chills, weakness or fatigue HEENT: Eyes: No change in vision               Ears, Nose, Throat:  No change in hearing or congestion Skin: No rash or itching Cardiovascular: No chest pain, chest pressure or palpitations   Respiratory: No SOB or cough Gastrointestinal: See HPI and otherwise negative Genitourinary: No dysuria or change in urinary frequency Neurological: No headache, dizziness or syncope Musculoskeletal: No new muscle or joint pain Hematologic: No bleeding or bruising Psychiatric: No history of depression or anxiety    Physical Exam:  Vital signs: There were no vitals taken for this visit.  Constitutional:   Pleasant Caucasian female appears to be in NAD, Well developed, Well  nourished, alert and cooperative Head:  Normocephalic and atraumatic. Eyes:   PEERL, EOMI. No icterus. Conjunctiva pink. Ears:  Normal auditory acuity. Neck:  Supple Throat: Oral cavity and pharynx without inflammation, swelling or lesion.  Respiratory: Respirations even and unlabored. Lungs clear to auscultation bilaterally.   No wheezes, crackles, or rhonchi.  Cardiovascular: Normal S1, S2. No MRG. Regular rate and rhythm. No peripheral edema, cyanosis or pallor.  Gastrointestinal:  Soft, nondistended, nontender. No rebound or guarding. Normal bowel sounds. No appreciable masses or hepatomegaly. Rectal:  Not performed.  Msk:  Symmetrical without gross deformities. Without edema, no deformity or joint abnormality.  Neurologic:  Alert and  oriented x4;  grossly normal neurologically.  Skin:   Dry and intact without significant lesions or rashes. Psychiatric: Oriented to person, place and time. Demonstrates good judgement and reason without abnormal affect or behaviors.  RELEVANT LABS AND IMAGING: CBC    Component Value Date/Time   WBC 6.8 07/01/2024 1008   WBC 8.0 03/02/2024 1511   RBC 5.24 (H) 07/01/2024 1008   HGB 14.3 07/01/2024 1008  HCT 43.4 07/01/2024 1008   PLT 307 07/01/2024 1008   MCV 82.8 07/01/2024 1008   MCH 27.3 07/01/2024 1008   MCHC 32.9 07/01/2024 1008   RDW 13.2 07/01/2024 1008   LYMPHSABS 1.4 07/01/2024 1008   MONOABS 0.5 07/01/2024 1008   EOSABS 0.3 07/01/2024 1008   BASOSABS 0.1 07/01/2024 1008    CMP     Component Value Date/Time   NA 138 07/01/2024 1008   K 4.6 07/01/2024 1008   CL 100 07/01/2024 1008   CO2 32 07/01/2024 1008   GLUCOSE 104 (H) 07/01/2024 1008   BUN 16 07/01/2024 1008   CREATININE 0.95 07/01/2024 1008   CREATININE 0.73 10/27/2021 1543   CALCIUM  9.5 07/01/2024 1008   PROT 7.7 07/01/2024 1008   ALBUMIN  4.4 07/01/2024 1008   AST 23 07/01/2024 1008   ALT 18 07/01/2024 1008   ALT 20 10/10/2017 1653   ALKPHOS 94 07/01/2024 1008    BILITOT 0.8 07/01/2024 1008   GFRNONAA >60 07/01/2024 1008   GFRNONAA 66 07/14/2020 1059   GFRAA 76 07/14/2020 1059    Assessment: 1.  Dysphagia: Last EGD in January 2024 with dilation of a Schatzki's ring, scheduled for repeat in January 2025 but had to cancel due to pneumonia 2.  COPD  Plan: 1. ***     Delon Failing, PA-C [facility] 08/06/2024, 12:53 PM  Cc: Merna Huxley, NP

## 2024-08-07 ENCOUNTER — Encounter: Payer: Self-pay | Admitting: Physician Assistant

## 2024-08-07 ENCOUNTER — Ambulatory Visit: Admitting: Physician Assistant

## 2024-08-07 VITALS — BP 118/70 | HR 90 | Ht 65.0 in | Wt 164.0 lb

## 2024-08-07 DIAGNOSIS — K219 Gastro-esophageal reflux disease without esophagitis: Secondary | ICD-10-CM | POA: Diagnosis not present

## 2024-08-07 DIAGNOSIS — K449 Diaphragmatic hernia without obstruction or gangrene: Secondary | ICD-10-CM | POA: Diagnosis not present

## 2024-08-07 DIAGNOSIS — K5900 Constipation, unspecified: Secondary | ICD-10-CM

## 2024-08-07 NOTE — Patient Instructions (Addendum)
  BOWEL PURGE:   I am recommending a bowel purge to aid in cleaning out your bowels.    OVER THE COUNTER SHOPPING GUIDE:   Purchase 1 (one) 119 GRAM bottle of Miralax  Purchase 1 (one) box of 5 mg Dulcolax tablets Purchase 1 (one) 32 ounce bottle of Gatorade   STEPS:   Mix the entire bottle of Miralax  in the 32 ounces of Gatorade and stir to dissolve completely. Take 4 dulcolax tablets, then wait 1 hour After 1 hour, drink the Miralax  solution you have prepared. You will drink this mixture over the next 2-3 hours.  You should expect results within 1 to 6 hours after completing this bowel purge.  After completing bowel purge start Miralax  1 capful twice daily in 8 ounces of liquid.

## 2024-08-08 NOTE — Progress Notes (Signed)
 Attending Physician's Attestation   I have reviewed the chart.   I agree with the Advanced Practitioner's note, impression, and recommendations with any updates as below. Agree with use of laxatives regularly.  Ideally colonoscopy would be pursued but she is at increased risk.  She seems to be average risk, so if at follow-up she is not sure about colonoscopy but still would like to have some understanding about screening, Cologuard could be considered.  Certainly if GI symptoms are persisting or not improving, will need to consider imaging and additional workup at that time.   Aloha Finner, MD [facility] Advanced Endoscopy Office # [phone]

## 2024-08-15 ENCOUNTER — Other Ambulatory Visit: Payer: Self-pay | Admitting: Gastroenterology

## 2024-08-24 ENCOUNTER — Ambulatory Visit: Admitting: Internal Medicine

## 2024-08-25 NOTE — Telephone Encounter (Signed)
 Received fax from [employer] stating pt was approved to receive Ohtuvayre  at not cost through patient assistance program through 09/30/24. Rx has been forwarded to [facility].

## 2024-08-28 ENCOUNTER — Other Ambulatory Visit: Payer: Self-pay | Admitting: Adult Health

## 2024-08-28 DIAGNOSIS — F321 Major depressive disorder, single episode, moderate: Secondary | ICD-10-CM

## 2024-09-01 ENCOUNTER — Telehealth: Payer: Self-pay | Admitting: Internal Medicine

## 2024-09-01 NOTE — Telephone Encounter (Signed)
 I called and spoke with patient, I advised her that she is to continue the Breztri  2 puffs twice a day and the ohtuvayre  is in addition to the Breztri .  I let her know to use the Ohtuvayre  twice a day, once in the morning and once in the evening.  She verbalized understanding.  Nothing further needed.

## 2024-09-01 NOTE — Telephone Encounter (Signed)
 Copied from CRM [id number]. Topic: Clinical - Medication Question >> Sep 01, 2024 11:26 AM Essie A wrote: Reason for CRM: Patient is taking Ohtuvayre  right now and wants to know if she should be taking the Budeson-Glycopyrrol-Formoterol  (BREZTRI  AEROSPHERE) 160-9-4.8 MCG/ACT AERO along with it.  Please return her call at [phone].  Thanks.

## 2024-10-02 ENCOUNTER — Other Ambulatory Visit: Payer: Self-pay

## 2024-10-02 MED ORDER — BREZTRI AEROSPHERE 160-9-4.8 MCG/ACT IN AERO: INHALATION_SPRAY | RESPIRATORY_TRACT | Status: AC

## 2024-10-02 MED ORDER — BREZTRI AEROSPHERE 160-9-4.8 MCG/ACT IN AERO: INHALATION_SPRAY | RESPIRATORY_TRACT | Status: DC

## 2024-10-02 NOTE — Telephone Encounter (Signed)
 Copied from CRM [id number]. Topic: Clinical - Prescription Issue >> Oct 02, 2024 11:47 AM Rilla NOVAK wrote: Reason for CRM: Patient returning call to [facility].  Please call patient @ [phone]  Called and spoke with the pt. Pt has to pay over $300 to get Breztri  at pharmacy.  I am mailing pt new PAP forms to complete and putting a sample at front desk for the pt.  Pt will mail back forms.   Dr. Darlean can you advise regarding giving Breztri  samples and if okay to give the pt while she fills out assistant forms.

## 2024-10-02 NOTE — Telephone Encounter (Signed)
 Per Dr. Darlean secure chat OK to give samples.

## 2024-10-02 NOTE — Telephone Encounter (Signed)
 Copied from CRM #[id number]. Topic: Clinical - Prescription Issue >> Sep 30, 2024 12:10 PM Russell PARAS wrote: Reason for CRM:   Pt is contacting clinic regarding her prescribed Breztri . She receives the medication through the PAP. However, she has not received her last shipment of refills from the program and is on her last inhaler.   Requesting call back with status update  CB# [phone]   ATC x1. LMTCB

## 2024-10-02 NOTE — Telephone Encounter (Signed)
 Left message on VM for patient to call back.  Does patient need a sample of Breztri ?  Do we need to fill out refill forms for Breztri  for patient to continue to get this under the patient assistance program?

## 2024-10-02 NOTE — Telephone Encounter (Signed)
 Samples placed up front and pt is aware.

## 2024-10-09 ENCOUNTER — Telehealth: Payer: Self-pay | Admitting: Pulmonary Disease

## 2024-10-09 NOTE — Telephone Encounter (Signed)
 Subject: [employer] Application for Free [employer] [facility].    Patient drop off Paperwork to be completed by MD.  Jud drop off  in MD box

## 2024-10-27 ENCOUNTER — Telehealth: Payer: Self-pay

## 2024-10-27 NOTE — Telephone Encounter (Unsigned)
 Copied from CRM #[id number]. Topic: Clinical - Prescription Issue >> Oct 27, 2024  2:23 PM Russell PARAS wrote: Reason for CRM:   Pt was advised by [employer] that they require a new prescription for her Breztri .   Requested call back once prescription sent, for status update  CB#  [phone]

## 2024-10-28 ENCOUNTER — Ambulatory Visit: Admitting: Adult Health

## 2024-10-30 NOTE — Telephone Encounter (Signed)
 Copied from CRM #[id number]. Topic: Clinical - Prescription Issue >> Oct 30, 2024 10:38 AM [name] wrote: Reason for CRM: Patient calling in to request the status of an order going to [employer] for her Breztri . Patient called on 1/27 and requested an order go to them. I do not see an update. Patient states [name] was suppose to call her back earlier in the week and she has not heard back.  Please call patient @ [phone]

## 2024-10-31 ENCOUNTER — Inpatient Hospital Stay (HOSPITAL_COMMUNITY)
Admission: EM | Admit: 2024-10-31 | Discharge: 2024-11-06 | Disposition: A | Source: Home / Self Care | Attending: Family Medicine | Admitting: Family Medicine

## 2024-10-31 ENCOUNTER — Emergency Department (HOSPITAL_COMMUNITY)

## 2024-10-31 ENCOUNTER — Other Ambulatory Visit: Payer: Self-pay

## 2024-10-31 ENCOUNTER — Encounter (HOSPITAL_COMMUNITY): Payer: Self-pay | Admitting: Emergency Medicine

## 2024-10-31 DIAGNOSIS — N390 Urinary tract infection, site not specified: Secondary | ICD-10-CM

## 2024-10-31 DIAGNOSIS — Z85118 Personal history of other malignant neoplasm of bronchus and lung: Secondary | ICD-10-CM

## 2024-10-31 DIAGNOSIS — J9621 Acute and chronic respiratory failure with hypoxia: Secondary | ICD-10-CM

## 2024-10-31 DIAGNOSIS — J441 Chronic obstructive pulmonary disease with (acute) exacerbation: Secondary | ICD-10-CM | POA: Diagnosis present

## 2024-10-31 DIAGNOSIS — R079 Chest pain, unspecified: Secondary | ICD-10-CM | POA: Diagnosis present

## 2024-10-31 DIAGNOSIS — N10 Acute tubulo-interstitial nephritis: Secondary | ICD-10-CM | POA: Insufficient documentation

## 2024-10-31 DIAGNOSIS — N12 Tubulo-interstitial nephritis, not specified as acute or chronic: Principal | ICD-10-CM

## 2024-10-31 LAB — CBC WITH DIFFERENTIAL/PLATELET
Abs Immature Granulocytes: 0.03 10*3/uL (ref 0.00–0.07)
Basophils Absolute: 0.1 10*3/uL (ref 0.0–0.1)
Basophils Relative: 1 %
Eosinophils Absolute: 0.4 10*3/uL (ref 0.0–0.5)
Eosinophils Relative: 4 %
HCT: 41.3 % (ref 36.0–46.0)
Hemoglobin: 13 g/dL (ref 12.0–15.0)
Immature Granulocytes: 0 %
Lymphocytes Relative: 14 %
Lymphs Abs: 1.3 10*3/uL (ref 0.7–4.0)
MCH: 27.1 pg (ref 26.0–34.0)
MCHC: 31.5 g/dL (ref 30.0–36.0)
MCV: 86 fL (ref 80.0–100.0)
Monocytes Absolute: 0.9 10*3/uL (ref 0.1–1.0)
Monocytes Relative: 10 %
Neutro Abs: 6.7 10*3/uL (ref 1.7–7.7)
Neutrophils Relative %: 71 %
Platelets: 274 10*3/uL (ref 150–400)
RBC: 4.8 MIL/uL (ref 3.87–5.11)
RDW: 12.7 % (ref 11.5–15.5)
WBC: 9.3 10*3/uL (ref 4.0–10.5)
nRBC: 0 % (ref 0.0–0.2)

## 2024-10-31 LAB — COMPREHENSIVE METABOLIC PANEL WITH GFR
ALT: 10 U/L (ref 0–44)
AST: 19 U/L (ref 15–41)
Albumin: 4.3 g/dL (ref 3.5–5.0)
Alkaline Phosphatase: 107 U/L (ref 38–126)
Anion gap: 10 (ref 5–15)
BUN: 16 mg/dL (ref 8–23)
CO2: 28 mmol/L (ref 22–32)
Calcium: 10 mg/dL (ref 8.9–10.3)
Chloride: 101 mmol/L (ref 98–111)
Creatinine, Ser: 0.83 mg/dL (ref 0.44–1.00)
GFR, Estimated: >60 mL/min
Glucose, Bld: 119 mg/dL — ABNORMAL HIGH (ref 70–99)
Potassium: 4 mmol/L (ref 3.5–5.1)
Sodium: 139 mmol/L (ref 135–145)
Total Bilirubin: 0.7 mg/dL (ref 0.0–1.2)
Total Protein: 8.3 g/dL — ABNORMAL HIGH (ref 6.5–8.1)

## 2024-10-31 LAB — RESP PANEL BY RT-PCR (RSV, FLU A&B, COVID)  RVPGX2
Influenza A by PCR: NEGATIVE
Influenza B by PCR: NEGATIVE
Resp Syncytial Virus by PCR: NEGATIVE
SARS Coronavirus 2 by RT PCR: NEGATIVE

## 2024-10-31 LAB — URINALYSIS, ROUTINE W REFLEX MICROSCOPIC
Bilirubin Urine: NEGATIVE
Glucose, UA: NEGATIVE mg/dL
Ketones, ur: NEGATIVE mg/dL
Nitrite: POSITIVE — AB
Protein, ur: >=300 mg/dL — AB
RBC / HPF: >50 RBC/hpf (ref 0–5)
Specific Gravity, Urine: 1.017 (ref 1.005–1.030)
WBC, UA: >50 WBC/hpf (ref 0–5)
pH: 5 (ref 5.0–8.0)

## 2024-10-31 LAB — LIPASE, BLOOD: Lipase: 14 U/L (ref 11–51)

## 2024-10-31 LAB — PRO BRAIN NATRIURETIC PEPTIDE: Pro Brain Natriuretic Peptide: 138 pg/mL

## 2024-10-31 LAB — TROPONIN T, HIGH SENSITIVITY: Troponin T High Sensitivity: <6 ng/L (ref 0–19)

## 2024-10-31 MED ORDER — ENOXAPARIN SODIUM 40 MG/0.4ML IJ SOSY
40.0000 mg | PREFILLED_SYRINGE | INTRAMUSCULAR | Status: DC
  Administered 2024-10-31 – 2024-11-05 (×6): 40 mg via SUBCUTANEOUS
  Filled 2024-10-31 (×6): qty 0.4

## 2024-10-31 MED ORDER — ACETAMINOPHEN 650 MG RE SUPP: 650.0000 mg | Freq: Four times a day (QID) | RECTAL | Status: DC | PRN

## 2024-10-31 MED ORDER — SODIUM CHLORIDE 0.9 % IV SOLN
1.0000 g | INTRAVENOUS | Status: DC
  Administered 2024-11-01 – 2024-11-05 (×5): 1 g via INTRAVENOUS
  Filled 2024-10-31 (×5): qty 10

## 2024-10-31 MED ORDER — ACETAMINOPHEN 325 MG PO TABS
650.0000 mg | ORAL_TABLET | Freq: Four times a day (QID) | ORAL | Status: DC | PRN
  Administered 2024-11-02 (×2): 650 mg via ORAL
  Filled 2024-10-31 (×2): qty 2

## 2024-10-31 MED ORDER — IPRATROPIUM-ALBUTEROL 0.5-2.5 (3) MG/3ML IN SOLN
3.0000 mL | Freq: Once | RESPIRATORY_TRACT | Status: AC
  Administered 2024-10-31: 3 mL via RESPIRATORY_TRACT
  Filled 2024-10-31: qty 3

## 2024-10-31 MED ORDER — ALBUTEROL SULFATE (2.5 MG/3ML) 0.083% IN NEBU
2.5000 mg | INHALATION_SOLUTION | RESPIRATORY_TRACT | Status: DC | PRN
  Administered 2024-11-05: 2.5 mg via RESPIRATORY_TRACT
  Filled 2024-10-31 (×2): qty 3

## 2024-10-31 MED ORDER — PANTOPRAZOLE SODIUM 40 MG PO TBEC
40.0000 mg | DELAYED_RELEASE_TABLET | Freq: Every day | ORAL | Status: DC
  Administered 2024-10-31 – 2024-11-05 (×6): 40 mg via ORAL
  Filled 2024-10-31 (×6): qty 1

## 2024-10-31 MED ORDER — CLONAZEPAM 0.5 MG PO TABS
0.5000 mg | ORAL_TABLET | Freq: Every day | ORAL | Status: DC | PRN
  Administered 2024-11-01 – 2024-11-05 (×5): 0.5 mg via ORAL
  Filled 2024-10-31 (×5): qty 1

## 2024-10-31 MED ORDER — TRAZODONE HCL 100 MG PO TABS
100.0000 mg | ORAL_TABLET | Freq: Every evening | ORAL | Status: DC | PRN
  Administered 2024-10-31 – 2024-11-05 (×6): 100 mg via ORAL
  Filled 2024-10-31 (×6): qty 1

## 2024-10-31 MED ORDER — SODIUM CHLORIDE 0.9 % IV SOLN
1.0000 g | Freq: Once | INTRAVENOUS | Status: AC
  Administered 2024-10-31: 1 g via INTRAVENOUS
  Filled 2024-10-31: qty 10

## 2024-10-31 MED ORDER — PROPRANOLOL HCL 20 MG PO TABS
20.0000 mg | ORAL_TABLET | Freq: Every day | ORAL | Status: DC
  Administered 2024-10-31 – 2024-11-05 (×6): 20 mg via ORAL
  Filled 2024-10-31 (×6): qty 1

## 2024-10-31 MED ORDER — GUAIFENESIN ER 600 MG PO TB12
600.0000 mg | ORAL_TABLET | Freq: Two times a day (BID) | ORAL | Status: DC
  Administered 2024-10-31 – 2024-11-04 (×8): 600 mg via ORAL
  Filled 2024-10-31 (×8): qty 1

## 2024-10-31 MED ORDER — PAROXETINE HCL 20 MG PO TABS
40.0000 mg | ORAL_TABLET | Freq: Every day | ORAL | Status: DC
  Administered 2024-10-31 – 2024-11-05 (×6): 40 mg via ORAL
  Filled 2024-10-31 (×6): qty 2

## 2024-10-31 MED ORDER — METHYLPREDNISOLONE SODIUM SUCC 40 MG IJ SOLR
40.0000 mg | Freq: Two times a day (BID) | INTRAMUSCULAR | Status: DC
  Administered 2024-11-01 – 2024-11-04 (×7): 40 mg via INTRAVENOUS
  Filled 2024-10-31 (×7): qty 1

## 2024-10-31 MED ORDER — ROSUVASTATIN CALCIUM 5 MG PO TABS
5.0000 mg | ORAL_TABLET | ORAL | Status: DC
  Administered 2024-11-02 – 2024-11-04 (×2): 5 mg via ORAL
  Filled 2024-10-31 (×2): qty 1

## 2024-10-31 MED ORDER — ENSIFENTRINE 3 MG/2.5ML IN SUSP: 3.0000 mg | Freq: Two times a day (BID) | RESPIRATORY_TRACT | Status: DC

## 2024-10-31 MED ORDER — DEXAMETHASONE SOD PHOSPHATE PF 10 MG/ML IJ SOLN
10.0000 mg | Freq: Once | INTRAMUSCULAR | Status: AC
  Administered 2024-10-31: 10 mg via INTRAVENOUS
  Filled 2024-10-31: qty 1

## 2024-10-31 NOTE — ED Provider Notes (Signed)
 "  EMERGENCY DEPARTMENT AT [facility] Provider Note   CSN: [id number] Arrival date & time: 10/31/24  1617     Patient presents with: No chief complaint on file.   [name] is a 69 y.o. female.   HPI  Patient presents with shortness of breath, abdominal and back pain, dysuria. She notes a history of chronic respiratory difficulty, takes multiple medications, including bronchodilators. Over the past few days she has had increased respiratory difficulty, and new dysuria, lower abdominal and left flank pain. No fever, syncope, fall.  Prior to Admission medications  Medication Sig Start Date End Date Taking? Authorizing Provider  albuterol  (PROVENTIL ) (2.5 MG/3ML) 0.083% nebulizer solution TAKE 3 ML (2.5 MG TOTAL) BY NEBULIZATION EVERY 4 HOURS AS NEEDED FOR WHEEZING OR SHORTNESS OF BREATH 05/22/24   Darlean Ozell NOVAK, MD  albuterol  (VENTOLIN  HFA) [phone] Base) MCG/ACT inhaler Inhale 2 puffs into the lungs every 4 (four) hours as needed for wheezing or shortness of breath. 05/05/24   Darlean Ozell NOVAK, MD  Budeson-Glycopyrrol-Formoterol  (BREZTRI  AEROSPHERE) 160-9-4.8 MCG/ACT AERO Inhale 2 puffs into the lungs 2 (two) times daily. 04/09/23   Hope Almarie ORN, NP  budesonide -glycopyrrolate -formoterol  (BREZTRI  AEROSPHERE) 160-9-4.8 MCG/ACT AERO inhaler 2 sample 10/02/24   Wert, Michael B, MD  clonazePAM  (KLONOPIN ) 0.5 MG tablet Take 1 tablet (0.5 mg total) by mouth 3 (three) times daily as needed. for anxiety 06/09/24   Nafziger, Darleene, NP  esomeprazole  (NEXIUM ) 40 MG capsule TAKE 1 CAPSULE (40 MG TOTAL) BY MOUTH DAILY. 08/17/24   Beather Delon Gibson, PA  guaiFENesin  (MUCINEX ) 600 MG 12 hr tablet Take 600 mg by mouth daily.    [provider]  hydrOXYzine  (VISTARIL ) 25 MG capsule Take 1 capsule (25 mg total) by mouth every 8 (eight) hours as needed. 02/06/24   Nafziger, Darleene, NP  PARoxetine  (PAXIL ) 40 MG tablet TAKE 1 TABLET BY MOUTH EVERY DAY 08/30/24   Nafziger,  Darleene, NP  propranolol  (INDERAL ) 20 MG tablet Take 1 capsule (60 mg total) by mouth daily. 06/03/24   Nafziger, Darleene, NP  rosuvastatin  (CRESTOR ) 5 MG tablet Take 1 tablet (5 mg total) by mouth 3 (three) times a week. 05/20/24   Madireddy, Alean SAUNDERS, MD  traZODone  (DESYREL ) 100 MG tablet TAKE 1 TABLET (100 MG TOTAL) BY MOUTH AT BEDTIME AS NEEDED. FOR SLEEP. NEED PHYSICAL EXAM FOR FURTHER REFILLS 05/22/24   Merna Darleene, NP    Allergies: Aspirin, Trelegy ellipta  [fluticasone -umeclidin-vilant], Morphine sulfate, Oxycodone , Hydrocodone , Penicillins, and Sulfa antibiotics    Review of Systems  Updated Vital Signs There were no vitals taken for this visit.  Physical Exam Vitals and nursing note reviewed.  Constitutional:      Appearance: She is well-developed. She is ill-appearing.  HENT:     Head: Normocephalic and atraumatic.  Eyes:     Conjunctiva/sclera: Conjunctivae normal.  Cardiovascular:     Rate and Rhythm: Regular rhythm. Tachycardia present.  Pulmonary:     Effort: Tachypnea and accessory muscle usage present.     Breath sounds: Normal breath sounds. No stridor.  Abdominal:     General: There is no distension.  Skin:    General: Skin is warm and dry.  Neurological:     Mental Status: She is alert and oriented to person, place, and time.     Cranial Nerves: No cranial nerve deficit.  Psychiatric:        Mood and Affect: Mood normal.     (all labs ordered are listed,  but only abnormal results are displayed) Labs Reviewed  RESP PANEL BY RT-PCR (RSV, FLU A&B, COVID)  [id number]  COMPREHENSIVE METABOLIC PANEL WITH GFR  LIPASE, BLOOD  CBC WITH DIFFERENTIAL/PLATELET  URINALYSIS, ROUTINE W REFLEX MICROSCOPIC  PRO BRAIN NATRIURETIC PEPTIDE    EKG: None  Radiology: No results found.   Procedures   Medications Ordered in the ED - No data to display                                  Medical Decision Making Patient with COPD, home oxygen  requirement and now with  shortness of breath, fatigue.  Broad differential including pneumonia, COPD exacerbation, COVID. In addition patient with dysuria, lower abdominal pain, concern for dysuria secondary to cystitis versus pyelonephritis versus stone.  Amount and/or Complexity of Data Reviewed Independent Historian: EMS    Details: EMS reports patient received bronchodilators, required 6 L versus her typical 4 L External Data Reviewed: notes. Labs: ordered. Decision-making details documented in ED Course. Radiology: ordered and independent interpretation performed. Decision-making details documented in ED Course. ECG/medicine tests: ordered and independent interpretation performed. Decision-making details documented in ED Course.  Risk Prescription drug management. Decision regarding hospitalization. Diagnosis or treatment significantly limited by social determinants of health.   7:22 PM Patient has improved, but with increased oxygen  requirement, accessory muscle use, is receiving DuoNeb.  Findings returned, discussed, patient with evidence for pyelonephritis, as well as COPD exacerbation. Patient is not hypotensive, but has tachycardia, tachypnea, meets SIRS criteria, though this is likely secondary to her respiratory difficulty, nation with urinary tract infection.  Patient has received ceftriaxone , steroids, bronchodilator here, and and route.  Patient admitted for monitoring, management.  Final diagnoses:  Pyelonephritis  COPD exacerbation [facility])     Garrick Charleston, MD 10/31/24 1923  "

## 2024-10-31 NOTE — H&P (Signed)
 " History and Physical    Glendine Swetz FMW:[id number] DOB: [birth year] DOA: 10/31/2024  PCP: Merna Huxley, NP  Patient coming from: Home  Chief Complaint: Shortness of breath  HPI: Shayana Hornstein is a 69 y.o. female with medical history significant of lung cancer status post right lower lobectomy and left upper lobectomy in remission, COPD on home oxygen , anxiety/depression, hiatal hernia, GERD, essential tremors, hyperlipidemia presenting with a chief complaint of shortness of breath.  Patient states she normally uses 4 L oxygen  24/7 at home for her COPD but this past week her breathing has been much worse, especially with exertion.  Also having cough productive of green-colored sputum and wheezing.  She is compliant with her home inhalers including Breztri  and Ohtuvayre .  Also using albuterol  rescue inhaler every 4 hours without any improvement of her symptoms.  She is also endorsing dysuria, suprapubic pressure, and left-sided flank pain.  Denies fevers, chills, nausea, or vomiting.  She reports a brief episode of substernal chest pain/epigastric abdominal pain last night which lasted only for a few minutes while she was turning position in bed.  She attributes this to having history of hiatal hernia.  Denies any chest pressure or heaviness.  She reports history of lung cancer in remission for the past 5 years.  She was previously a heavy smoker but stopped smoking since after her lung cancer diagnosis.  ED Course: Tachycardic and tachypneic on arrival.  Requiring 7 L [place] to maintain oxygen  saturation in the 90s.  Labs showing no leukocytosis, creatinine 0.83, normal lipase and LFTs, proBNP normal, COVID/influenza/RSV PCR negative.  UA with positive nitrite, large amount of leukocytes, >50 RBCs, >50 WBCs, and rare bacteria.  Chest x-ray showing no acute cardiopulmonary abnormality.  CT renal stone study showing very mild left hydroureter and mild left periureteral inflammatory fat  stranding without evidence of obstructing renal calculi.  Findings felt to represent sequelae associated with a recently passed renal stone.  Patient was given IV Decadron  10 mg, DuoNeb, and ceftriaxone .  TRH called to admit.  Review of Systems:  Review of Systems  All other systems reviewed and are negative.   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 prior to study with DLCO  10.06 (48%) corrects to 2.32 (55%)  for alv volume and FV curve classic concave exp loop  - 06/29/2021  After extensive coaching inhaler device,  effectiveness =    90% with dpi, 75% with hfa    - 06/29/2021   Walked on RA x  3  lap(s) =  approx 750 ft @ mod fast pace, stop   Depression  Depression, major, single episode, moderate (HCC) 03/19/2008   Qualifier: Diagnosis of   By: Jame  MD, Maude FALCON        Dyspnea    Emphysema of lung (HCC) 03/13/2018   Emphysema, unspecified (HCC)    per patient mild case   Epigastric pain 10/09/2022   Esophageal dysphagia 05/11/2023   Family history of adverse reaction to anesthesia    patient states sister has a hard time waking up from anesthesia.    GERD 03/19/2008   Qualifier: Diagnosis of   By: Jame   MD, Maude FALCON        Grief reaction 01/24/2022   Hepatitis B core antibody positive 10/09/2022   Hepatitis C    Hepatitis C, chronic (HCC) 03/14/2015   Hiatal hernia 10/09/2022   History of hepatitis C 10/09/2022   Left upper lobe pulmonary nodule 02/04/2018   IMPRESSION:  1. Negative for acute pulmonary embolus or aortic dissection  2. 14 mm left upper lobe pulmonary nodule, increased in size  compared to prior and therefore felt suspicious for carcinoma.  Consider correlation with PET-CT and/or tissue sampling.  3. Mild emphysema     Aortic Atherosclerosis (ICD10-I70.0) and Emphysema (ICD10-J43.9).        Electronically Signed    By: Luke Bun M.D   lung ca dx'd 03/2018   Lung mass 03/13/2018   Migraine headache 08/01/2010   Qualifier: Diagnosis of   By: Jame  MD, Maude FALCON        Mitral stenosis    NEPHROLITHIASIS, HX OF 03/19/2008   Qualifier: Diagnosis of   By: Jame  MD, Maude FALCON        Nonrheumatic mitral (valve) stenosis    Oxygen  deficiency    Palpitations 02/13/2024   Pill dysphagia 10/09/2022   Pleural effusion 10/06/2021   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   SOB (shortness of breath) 09/18/2021   Tobacco use disorder 01/21/2014   Tremor 10/14/2012   Tremor, unspecified    non-specific; takes inderal .    Vaccine counseling 11/13/2017    Past Surgical History:  Procedure Laterality Date   ABDOMINAL HYSTERECTOMY  1986   menorrhagia; hx of cryo for abnormal paps   APPENDECTOMY     removed in 1986 per pt   BIOPSY  10/31/2022   Procedure: BIOPSY;  Surgeon: Wilhelmenia Aloha Raddle., MD;  Location: [facility];  Service: Gastroenterology;;   CATARACT EXTRACTION Bilateral 07/2022   ESOPHAGOGASTRODUODENOSCOPY (EGD) WITH PROPOFOL  N/A 10/31/2022   Procedure: ESOPHAGOGASTRODUODENOSCOPY (EGD) WITH PROPOFOL ;  Surgeon: Wilhelmenia Aloha Raddle., MD;  Location: [facility];  Service: Gastroenterology;  Laterality: N/A;   INTERCOSTAL NERVE BLOCK Right 12/18/2019    Procedure: Intercostal Nerve Block;  Surgeon: Kerrin Elspeth BROCKS, MD;  Location: [facility] OR;  Service: Thoracic;  Laterality: Right;   LOBECTOMY Left 03/13/2018   Procedure: Lingula section of left upper lobe lobectomy;  Surgeon: Kerrin Elspeth BROCKS, MD;  Location: [facility] OR;  Service: Thoracic;  Laterality: Left;   NODE DISSECTION  12/18/2019   Procedure: Node Dissection;  Surgeon: Kerrin Elspeth BROCKS, MD;  Location: [facility] OR;  Service: Thoracic;;   SAVORY DILATION N/A 10/31/2022   Procedure: HARLEY HODGKIN;  Surgeon: Wilhelmenia Aloha Raddle., MD;  Location: [facility];  Service: Gastroenterology;  Laterality: N/A;   VIDEO ASSISTED THORACOSCOPY (VATS)/WEDGE RESECTION Left 03/13/2018   Procedure: VIDEO ASSISTED THORACOSCOPY (VATS)/WEDGE RESECTION;  Surgeon: Kerrin Elspeth BROCKS, MD;  Location: [facility] OR;  Service: Thoracic;  Laterality: Left;     reports  that she quit smoking about 6 years ago. Her smoking use included cigarettes. She started smoking about 46 years ago. She has a 40 pack-year smoking history. She has never been exposed to tobacco smoke. She has never used smokeless tobacco. She reports that she does not currently use alcohol. She reports that she does not currently use drugs.  Allergies[1]  Family History  Problem Relation Age of Onset   Asthma Mother    Alzheimer's disease Mother 55   Hypertension Mother    Other Father        shot    Neuropathy Sister        face   Cancer Paternal Grandmother        abdominal   Cancer Maternal Aunt        breast   Cancer Paternal Aunt    Lung cancer Paternal Uncle    Colon cancer Neg Hx    Esophageal cancer Neg Hx    Inflammatory bowel disease Neg Hx    Liver disease Neg Hx    Pancreatic cancer Neg Hx    Rectal cancer Neg Hx    Stomach cancer Neg Hx    Tremor Neg Hx    Parkinson's disease Neg Hx     Prior to Admission medications  Medication Sig Start Date End Date Taking? Authorizing Provider  albuterol  (PROVENTIL ) (2.5 MG/3ML)  0.083% nebulizer solution TAKE 3 ML (2.5 MG TOTAL) BY NEBULIZATION EVERY 4 HOURS AS NEEDED FOR WHEEZING OR SHORTNESS OF BREATH Patient taking differently: Take 2.5 mg by nebulization every 4 (four) hours as needed for wheezing or shortness of breath. 05/22/24  Yes Darlean Ozell NOVAK, MD  albuterol  (VENTOLIN  HFA) 108 (90 Base) MCG/ACT inhaler Inhale 2 puffs into the lungs every 4 (four) hours as needed for wheezing or shortness of breath. 05/05/24  Yes Darlean Ozell NOVAK, MD  budesonide -glycopyrrolate -formoterol  (BREZTRI  AEROSPHERE) 160-9-4.8 MCG/ACT AERO inhaler 2 sample Patient taking differently: Inhale 2 puffs into the lungs in the morning and at bedtime. 10/02/24  Yes Darlean Ozell NOVAK, MD  clonazePAM  (KLONOPIN ) 0.5 MG tablet Take 1 tablet (0.5 mg total) by mouth 3 (three) times daily as needed. for anxiety Patient taking differently: Take 0.5 mg by mouth at bedtime. 06/09/24  Yes Nafziger, Darleene, NP  esomeprazole  (NEXIUM ) 40 MG capsule TAKE 1 CAPSULE (40 MG TOTAL) BY MOUTH DAILY. Patient taking differently: Take 40 mg by mouth at bedtime. 08/17/24  Yes Beather Delon Gibson, PA  fluticasone  (FLONASE ) 50 MCG/ACT nasal spray Place 2 sprays into both nostrils in the morning and at bedtime.   Yes [provider]  hydrOXYzine  (VISTARIL ) 25 MG capsule Take 1 capsule (25 mg total) by mouth every 8 (eight) hours as needed. Patient taking differently: Take 25 mg by mouth every 8 (eight) hours as needed for itching (for itching related to insect bites). 02/06/24  Yes Nafziger, Darleene, NP  Ibuprofen  200 MG CAPS Take 200-800 mg by mouth every 8 (eight) hours as needed (for migraines or mild pain).   Yes [provider]  OHTUVAYRE  3 MG/2.5ML SUSP Inhale 3 mg into the lungs in the morning and at bedtime.   Yes [provider]  OXYGEN  Inhale 4 L/min into the lungs continuous.   Yes [provider]  PARoxetine  (PAXIL ) 40 MG tablet TAKE 1 TABLET BY MOUTH EVERY DAY Patient taking differently:  Take 40 mg by mouth at bedtime. 08/30/24  Yes Nafziger, Darleene, NP  propranolol  (INDERAL ) 20 MG tablet Take 1 capsule (60 mg  total) by mouth daily. Patient taking differently: Take 20 mg by mouth at bedtime. 06/03/24  Yes Nafziger, Darleene, NP  rosuvastatin  (CRESTOR ) 5 MG tablet Take 1 tablet (5 mg total) by mouth 3 (three) times a week. Patient taking differently: Take 5 mg by mouth See admin instructions. Take 5 mg by mouth at bedtime on Mon/Wed/Fri 05/20/24  Yes Madireddy, Alean SAUNDERS, MD  traZODone  (DESYREL ) 100 MG tablet TAKE 1 TABLET (100 MG TOTAL) BY MOUTH AT BEDTIME AS NEEDED. FOR SLEEP. NEED PHYSICAL EXAM FOR FURTHER REFILLS Patient taking differently: Take 100 mg by mouth at bedtime. 05/22/24  Yes Nafziger, Darleene, NP  Budeson-Glycopyrrol-Formoterol  (BREZTRI  AEROSPHERE) 160-9-4.8 MCG/ACT AERO Inhale 2 puffs into the lungs 2 (two) times daily. Patient not taking: Reported on 10/31/2024 04/09/23   Hope Almarie ORN, NP    Physical Exam: Vitals:   10/31/24 1653  BP: (!) 148/116  Pulse: (!) 113  Resp: (!) 23  Temp: 97.9 F (36.6 C)  TempSrc: Oral  SpO2: 99%    Physical Exam Vitals reviewed.  Constitutional:      General: She is not in acute distress. HENT:     Head: Normocephalic and atraumatic.  Eyes:     Extraocular Movements: Extraocular movements intact.  Cardiovascular:     Rate and Rhythm: Normal rate and regular rhythm.     Pulses: Normal pulses.  Pulmonary:     Effort: Pulmonary effort is normal. No respiratory distress.     Comments: Diminished breath sounds bilaterally Scattered wheezing Abdominal:     General: Bowel sounds are normal.     Palpations: Abdomen is soft.     Tenderness: There is no abdominal tenderness. There is no guarding.  Musculoskeletal:     Cervical back: Normal range of motion.     Right lower leg: No edema.     Left lower leg: No edema.  Skin:    General: Skin is warm and dry.  Neurological:     General: No focal deficit present.     Mental  Status: She is alert and oriented to person, place, and time.     Labs on Admission: I have personally reviewed following labs and imaging studies  CBC: Recent Labs  Lab 10/31/24 1702  WBC 9.3  NEUTROABS 6.7  HGB 13.0  HCT 41.3  MCV 86.0  PLT 274   Basic Metabolic Panel: Recent Labs  Lab 10/31/24 1702  NA 139  K 4.0  CL 101  CO2 28  GLUCOSE 119*  BUN 16  CREATININE 0.83  CALCIUM  10.0   GFR: CrCl cannot be calculated (Unknown ideal weight.). Liver Function Tests: Recent Labs  Lab 10/31/24 1702  AST 19  ALT 10  ALKPHOS 107  BILITOT 0.7  PROT 8.3*  ALBUMIN  4.3   Recent Labs  Lab 10/31/24 1702  LIPASE 14   No results for input(s): AMMONIA in the last 168 hours. Coagulation Profile: No results for input(s): INR, PROTIME in the last 168 hours. Cardiac Enzymes: No results for input(s): CKTOTAL, CKMB, CKMBINDEX, TROPONINI in the last 168 hours. BNP (last 3 results) Recent Labs    10/31/24 1702  PROBNP 138.0   HbA1C: No results for input(s): HGBA1C in the last 72 hours. CBG: No results for input(s): GLUCAP in the last 168 hours. Lipid Profile: No results for input(s): CHOL, HDL, LDLCALC, TRIG, CHOLHDL, LDLDIRECT in the last 72 hours. Thyroid  Function Tests: No results for input(s): TSH, T4TOTAL, FREET4, T3FREE, THYROIDAB in the last 72 hours. Anemia Panel: No  results for input(s): VITAMINB12, FOLATE, FERRITIN, TIBC, IRON, RETICCTPCT in the last 72 hours. Urine analysis:    Component Value Date/Time   COLORURINE AMBER (A) 10/31/2024 1748   APPEARANCEUR CLOUDY (A) 10/31/2024 1748   LABSPEC 1.017 10/31/2024 1748   PHURINE 5.0 10/31/2024 1748   GLUCOSEU NEGATIVE 10/31/2024 1748   HGBUR LARGE (A) 10/31/2024 1748   BILIRUBINUR NEGATIVE 10/31/2024 1748   BILIRUBINUR n 04/30/2014 1109   KETONESUR NEGATIVE 10/31/2024 1748   PROTEINUR >=300 (A) 10/31/2024 1748   UROBILINOGEN 0.2 04/30/2014 1109    NITRITE POSITIVE (A) 10/31/2024 1748   LEUKOCYTESUR LARGE (A) 10/31/2024 1748    Radiological Exams on Admission: CT Renal Stone Study Result Date: 10/31/2024 CLINICAL DATA:  Left flank pain. EXAM: CT ABDOMEN AND PELVIS WITHOUT CONTRAST TECHNIQUE: Multidetector CT imaging of the abdomen and pelvis was performed following the standard protocol without IV contrast. RADIATION DOSE REDUCTION: This exam was performed according to the departmental dose-optimization program which includes automated exposure control, adjustment of the mA and/or kV according to patient size and/or use of iterative reconstruction technique. COMPARISON:  June 09, 2014 FINDINGS: Lower chest: Mild scarring and/or atelectasis is seen within the bilateral lung bases a with possible early bullous disease noted within the posterolateral aspect of the right lower lobe. Hepatobiliary: No focal liver abnormality is seen. Tiny gallstones are seen within the dependent portion of a mildly distended gallbladder without evidence of gallbladder wall thickening or biliary dilatation. Pancreas: Unremarkable. No pancreatic ductal dilatation or surrounding inflammatory changes. Spleen: Normal in size without focal abnormality. Adrenals/Urinary Tract: Adrenal glands are unremarkable. Kidneys are normal in size, without focal lesions. There is a very mild left hydroureter and mild left periureteral inflammatory fat stranding. No obstructing renal calculi are identified. The urinary bladder is poorly distended and is otherwise unremarkable. Stomach/Bowel: Stomach is within normal limits. The appendix is surgically absent. No evidence of bowel wall thickening, distention, or inflammatory changes. Vascular/Lymphatic: Aortic atherosclerosis. No enlarged abdominal or pelvic lymph nodes. Reproductive: Status post hysterectomy. No adnexal masses. Other: No abdominal wall hernia or abnormality. No abdominopelvic ascites. Musculoskeletal: No acute or significant  osseous findings. IMPRESSION: 1. Very mild left hydroureter and mild left periureteral inflammatory fat stranding, without evidence of obstructing renal calculi. This may represent sequelae associated with a recently passed renal stone. 2. Cholelithiasis without evidence of acute cholecystitis. 3. Evidence of prior appendectomy and hysterectomy. 4. Aortic atherosclerosis. Electronically Signed   By: Suzen Dials M.D.   On: 10/31/2024 19:01   DG Chest Port 1 View Result Date: 10/31/2024 EXAM: 1 VIEW(S) XRAY OF THE CHEST 10/31/2024 05:16:10 PM COMPARISON: 03/02/2024 CLINICAL HISTORY: Shortness of breath. FINDINGS: LUNGS AND PLEURA: Emphysematous lungs. Postoperative changes with bilateral lung staple lines. No focal pulmonary opacity. No pleural effusion. No pneumothorax. HEART AND MEDIASTINUM: No acute abnormality of the cardiac and mediastinal silhouettes. BONES AND SOFT TISSUES: No acute osseous abnormality. IMPRESSION: 1.  No acute cardiopulmonary abnormality. Emphysema. Electronically signed by: Norman Gatlin MD 10/31/2024 05:28 PM EST RP Workstation: [id number]    Assessment and Plan  Acute on chronic hypoxemic respiratory failure secondary to acute COPD exacerbation Presenting with 1 week history of progressively worsening dyspnea on exertion, wheezing, and cough productive of green-colored sputum.  She is on 4 L home oxygen  24/7 but currently requiring 7 L [place].  No respiratory distress at this time.  Chest x-ray not suggestive of pneumonia.  COVID/influenza/RSV PCR negative.  Continue treatment with Solu-Medrol  40 mg every 12 hours, DuoNeb every 6  hours, albuterol  neb every 2 hours as needed, and ceftriaxone .  Continue home Ohtuvayre .  Mucinex , flutter valve.  Complicated UTI/left pyelonephritis Patient is reporting dysuria, suprapubic pressure, and left-sided flank pain.  UA with evidence of pyuria and bacteria.  CT showing very mild left hydroureter and mild left periureteral inflammatory  fat stranding without evidence of obstructing renal calculi.  Findings felt to represent sequelae associated with a recently passed renal stone.  No fever, leukocytosis, or signs of sepsis at this time.  Continue ceftriaxone  and follow-up urine culture.  Chest pain She reports a brief episode of substernal chest pain/epigastric abdominal pain last night which lasted only for a few minutes while she was turning position in bed.  She attributes this to having history of hiatal hernia.  Denies any chest pressure or heaviness.  Not endorsing any epigastric abdominal pain at this time.  Normal lipase and LFTs.  No nausea or vomiting.  Stat EKG and troponin ordered.  History of lung cancer status post right lower lobectomy and left upper lobectomy Patient states she has been in remission for the past 5 years.  Outpatient follow-up.  Anxiety/depression/insomnia Continue Klonopin , Paxil , and trazodone .  Mild to moderate mitral regurgitation Seen on echo done in August 2025.  No pulmonary edema on imaging.  Outpatient follow-up.  Hiatal hernia/GERD Continue PPI.  Essential tremors Continue propranolol .  Hyperlipidemia Continue Crestor .  DVT prophylaxis: Lovenox  Code Status: DNR/DNI (discussed with the patient) Level of care: Progressive Care Unit Admission status: It is my clinical opinion that admission to INPATIENT is reasonable and necessary because of the expectation that this patient will require hospital care that crosses at least 2 midnights to treat this condition based on the medical complexity of the problems presented.  Given the aforementioned information, the predictability of an adverse outcome is felt to be significant.  Editha Ram MD [facility]  If 7PM-7AM, please contact night-coverage [email or web address]  10/31/2024, 7:34 PM       [1]  Allergies Allergen Reactions   Aspirin Shortness Of Breath   Trelegy Ellipta  [Fluticasone -Umeclidin-Vilant] Shortness Of  Breath and Other (See Comments)    She reports that is made her cough worse, also   Morphine Sulfate Other (See Comments)    RED STREAKS ON ARMS   Oxycodone  Itching and Rash   Hydrocodone  Itching   Penicillins Nausea Only and Other (See Comments)     Heartburn, upset stomach only & tolerated AMOXIL  after this reaction   Sulfa Antibiotics Tinitus   "

## 2024-10-31 NOTE — ED Triage Notes (Signed)
 Patient presents due to shortness of breath that has increased over the past 10 days. EMS reports a productive cough with green mucus. She is normally on 4 L O 2 baseline but had to increase oxygen  to 6 L. She had a nebulizer treatment prior to EMS arrival which helped wheezing. EMS reports clear lung sounds.   She also is concerned she may have a UTI due to dysuria, abdominal pain, back pain, dark urine and urinary retention. EMS reported abdominal distention, rigidity and lower abdominal guarding. They also report a pale face and cool extremities.    HX: Lung Cancer, pneumonia   EMS vitals: 132 palpated BP 119 HR 96% SPO2 on 6 L O 2 nasal canula

## 2024-11-01 DIAGNOSIS — N10 Acute tubulo-interstitial nephritis: Secondary | ICD-10-CM | POA: Insufficient documentation

## 2024-11-01 MED ORDER — ALBUTEROL SULFATE (2.5 MG/3ML) 0.083% IN NEBU
2.5000 mg | INHALATION_SOLUTION | RESPIRATORY_TRACT | Status: DC
  Administered 2024-11-01 – 2024-11-02 (×6): 2.5 mg via RESPIRATORY_TRACT
  Filled 2024-11-01 (×6): qty 3

## 2024-11-01 MED ORDER — IPRATROPIUM-ALBUTEROL 0.5-2.5 (3) MG/3ML IN SOLN: 3.0000 mL | Freq: Four times a day (QID) | RESPIRATORY_TRACT | Status: DC

## 2024-11-01 MED ORDER — BUDESON-GLYCOPYRROL-FORMOTEROL 160-9-4.8 MCG/ACT IN AERO
2.0000 | INHALATION_SPRAY | Freq: Two times a day (BID) | RESPIRATORY_TRACT | Status: DC
  Administered 2024-11-01 – 2024-11-04 (×7): 2 via RESPIRATORY_TRACT
  Filled 2024-11-01: qty 5.9

## 2024-11-01 MED ORDER — INFLUENZA VAC SPLIT HIGH-DOSE 0.5 ML IM SUSY
0.5000 mL | PREFILLED_SYRINGE | INTRAMUSCULAR | Status: AC
  Administered 2024-11-02: 0.5 mL via INTRAMUSCULAR
  Filled 2024-11-01: qty 0.5

## 2024-11-01 MED ORDER — FLUTICASONE PROPIONATE 50 MCG/ACT NA SUSP
2.0000 | Freq: Every day | NASAL | Status: DC
  Administered 2024-11-01 – 2024-11-06 (×6): 2 via NASAL
  Filled 2024-11-01: qty 16

## 2024-11-01 MED ORDER — AZITHROMYCIN 250 MG PO TABS
500.0000 mg | ORAL_TABLET | Freq: Every day | ORAL | Status: AC
  Administered 2024-11-01 – 2024-11-05 (×5): 500 mg via ORAL
  Filled 2024-11-01 (×5): qty 2

## 2024-11-01 NOTE — ED Notes (Signed)
 Meal tray given to patient. [name]

## 2024-11-01 NOTE — Hospital Course (Addendum)
 69 year old woman complex PMH including COPD on 4 L nasal cannula, presented with increased shortness of breath, dyspnea on exertion, productive cough and wheezing, dysuria, suprapubic pressure and left-sided flank pain.  Initially required 7 L nasal cannula.  Admitted for acute on chronic hypoxic respiratory failure, COPD exacerbation, left-sided pyelonephritis.  Consultants None   Procedures/Events

## 2024-11-01 NOTE — Plan of Care (Signed)

## 2024-11-01 NOTE — ED Notes (Signed)
 Due to Rapid Response situation on 5 E pt will remain in ED till updated.

## 2024-11-01 NOTE — Progress Notes (Signed)
" °  Progress Note   Patient: [name] FMW:[id number] DOB: 1956-07-02 DOA: 10/31/2024     1 DOS: the patient was seen and examined on 11/01/2024   Brief hospital course: 69 year old woman complex PMH including COPD on 4 L nasal cannula, presented with increased shortness of breath, dyspnea on exertion, productive cough and wheezing, dysuria, suprapubic pressure and left-sided flank pain.  Initially required 7 L nasal cannula.  Admitted for acute on chronic hypoxic respiratory failure, COPD exacerbation, left-sided pyelonephritis.  Consultants None   Procedures/Events   Assessment and Plan: Acute on chronic hypoxic respiratory failure (4 L baseline) Acute COPD exacerbation PMH of lung cancer status post right lower lobe lobectomy, left upper lobe lobectomy Required 7 L initially in the emergency department.  Chest x-ray no acute disease.  COVID, flu, RSV negative.  Lung cancer in remission 5 years. Still wheezing and coughing.  Continue bronchodilators and steroids.  Had atypical coverage.  Left-sided pyelonephritis Symptomatic on admission, urinalysis grossly abnormal.  CT showing mild left hydroureter and mild left periureteral inflammatory stranding. Empiric ceftriaxone .  Follow-up culture data.  Atypical chest pain Brief, positional in nature.  No EKG done despite order.  BNP and troponin negative.  No further evaluation suggested.  Secondary to cough and COPD.  Anxiety Depression Insomnia Continue Klonopin , Paxil , trazodone   Essential tremor--propranolol  Hyperlipidemia--Crestor  GERD--PPI     Subjective:  Feels a bit better, less pain on the left side.  Breathing okay but wheezing and coughing quite a bit.    Physical Exam: Vitals:   11/01/24 0545 11/01/24 0550 11/01/24 0838 11/01/24 0914  BP: (!) 119/57 (!) 119/57  (!) 120/95  Pulse:  99  86  Resp: (!) 23 (!) 22  (!) 26  Temp:  98.2 F (36.8 C) 97.6 F (36.4 C)   TempSrc:  Oral Oral   SpO2:  99%  100%    Physical Exam Vitals reviewed.  Constitutional:      General: She is not in acute distress.    Appearance: She is not ill-appearing (chronically) or toxic-appearing.  Cardiovascular:     Rate and Rhythm: Normal rate and regular rhythm.     Heart sounds: No murmur heard. Pulmonary:     Effort: Pulmonary effort is normal. No respiratory distress.     Breath sounds: No wheezing, rhonchi or rales.  Neurological:     Mental Status: She is alert.  Psychiatric:        Mood and Affect: Mood normal.        Behavior: Behavior normal.     Data Reviewed: CMP noted BMP and troponin unremarkable CBC within normal limits COVID, flu, RSV negative Urinalysis grossly abnormal, urine culture pending Chest x-ray no acute disease CT renal stone protocol mild left hydroureter, periureteral inflammatory fat stranding  Family Communication: none  Disposition: Status is: Inpatient Remains inpatient appropriate because: COPD, pyelo  Planned Discharge Destination: Home    Time spent: 35 minutes  Author: Toribio Door, MD 11/01/2024 10:52 AM  For on call review [email or web address]  "

## 2024-11-01 NOTE — Plan of Care (Signed)
   Problem: Education: Goal: Knowledge of General Education information will improve Description: Including pain rating scale, medication(s)/side effects and non-pharmacologic comfort measures Outcome: Progressing   Problem: Clinical Measurements: Goal: Ability to maintain clinical measurements within normal limits will improve Outcome: Progressing

## 2024-11-02 DIAGNOSIS — N10 Acute tubulo-interstitial nephritis: Secondary | ICD-10-CM

## 2024-11-02 DIAGNOSIS — J441 Chronic obstructive pulmonary disease with (acute) exacerbation: Secondary | ICD-10-CM | POA: Diagnosis not present

## 2024-11-02 DIAGNOSIS — J9621 Acute and chronic respiratory failure with hypoxia: Secondary | ICD-10-CM

## 2024-11-02 LAB — MISC LABCORP TEST (SEND OUT)
LabCorp test name: 83935
Labcorp test code: 83935

## 2024-11-02 MED ORDER — ALBUTEROL SULFATE (2.5 MG/3ML) 0.083% IN NEBU
2.5000 mg | INHALATION_SOLUTION | Freq: Four times a day (QID) | RESPIRATORY_TRACT | Status: DC
  Administered 2024-11-02 – 2024-11-04 (×8): 2.5 mg via RESPIRATORY_TRACT
  Filled 2024-11-02 (×7): qty 3

## 2024-11-02 NOTE — Plan of Care (Signed)

## 2024-11-02 NOTE — Progress Notes (Signed)
" °  Progress Note   Patient: [name] FMW:[id number] DOB: 05-24-1956 DOA: 10/31/2024     2 DOS: the patient was seen and examined on 11/02/2024   Brief hospital course: 69 year old woman complex PMH including COPD on 4 L nasal cannula, presented with increased shortness of breath, dyspnea on exertion, productive cough and wheezing, dysuria, suprapubic pressure and left-sided flank pain.  Initially required 7 L nasal cannula.  Admitted for acute on chronic hypoxic respiratory failure, COPD exacerbation, left-sided pyelonephritis.  Consultants None   Procedures/Events   Assessment and Plan: Acute on chronic hypoxic respiratory failure (4 L baseline) Acute COPD exacerbation PMH of lung cancer status post right lower lobe lobectomy, left upper lobe lobectomy Required 7 L initially in the emergency department.  Chest x-ray no acute disease.  COVID, flu, RSV negative.  Lung cancer in remission 5 years. No significant improvement.  Continue bronchodilators, steroids, azithromycin .   Left-sided E. coli pyelonephritis Symptomatic on admission, urinalysis grossly abnormal.  CT showing mild left hydroureter and mild left periureteral inflammatory stranding. Urine culture with E. coli.  Follow-up sensitivities.  Continue empiric ceftriaxone .   Atypical chest pain--resolved Brief, positional in nature.  No EKG done despite order.  BNP and troponin negative.  No further evaluation suggested.  Secondary to cough and COPD.   Anxiety Depression Insomnia Continue Klonopin , Paxil , trazodone    Essential tremor--propranolol  Hyperlipidemia--Crestor  GERD--PPI      Subjective:  Feels about the same still short of breath Having left-sided abdominal pain radiating to the back  Physical Exam: Vitals:   11/02/24 0853 11/02/24 1103 11/02/24 1219 11/02/24 1224  BP:   (!) 146/124 (!) 123/59  Pulse:   82 81  Resp:   16   Temp:   98.2 F (36.8 C)   TempSrc:   Oral   SpO2: 99% 97% 100%    Weight:      Height:       Physical Exam Vitals reviewed.  Constitutional:      General: She is not in acute distress.    Appearance: She is ill-appearing. She is not toxic-appearing.  Cardiovascular:     Rate and Rhythm: Normal rate and regular rhythm.     Heart sounds: No murmur heard. Pulmonary:     Effort: No respiratory distress.     Breath sounds: Wheezing present. No rhonchi or rales.     Comments: Moderate increased respiratory effort, speaks in short sentences Neurological:     Mental Status: She is alert.  Psychiatric:        Mood and Affect: Mood normal.        Behavior: Behavior normal.     Data Reviewed: No new data  Family Communication:   Disposition: Status is: Inpatient Remains inpatient appropriate because: COPD, pyelonephritis     Time spent: 20 minutes  Author: Toribio Door, MD 11/02/2024 4:07 PM  For on call review [email or web address]    "

## 2024-11-03 DIAGNOSIS — N10 Acute tubulo-interstitial nephritis: Secondary | ICD-10-CM | POA: Diagnosis not present

## 2024-11-03 DIAGNOSIS — J441 Chronic obstructive pulmonary disease with (acute) exacerbation: Secondary | ICD-10-CM | POA: Diagnosis not present

## 2024-11-03 DIAGNOSIS — J9621 Acute and chronic respiratory failure with hypoxia: Secondary | ICD-10-CM | POA: Diagnosis not present

## 2024-11-03 LAB — URINE CULTURE: Culture: >=100000 — AB

## 2024-11-03 MED ORDER — ACETAMINOPHEN 650 MG RE SUPP: 650.0000 mg | Freq: Four times a day (QID) | RECTAL | Status: DC | PRN

## 2024-11-03 MED ORDER — ACETAMINOPHEN 500 MG PO TABS
1000.0000 mg | ORAL_TABLET | Freq: Four times a day (QID) | ORAL | Status: DC | PRN
  Administered 2024-11-03 – 2024-11-04 (×2): 1000 mg via ORAL
  Filled 2024-11-03 (×2): qty 2

## 2024-11-03 NOTE — Progress Notes (Signed)
" °  Progress Note   Patient: [name] FMW:[id number] DOB: June 05, 1956 DOA: 10/31/2024     3 DOS: the patient was seen and examined on 11/03/2024   Brief hospital course: 69 year old woman complex PMH including COPD on 4 L nasal cannula, presented with increased shortness of breath, dyspnea on exertion, productive cough and wheezing, dysuria, suprapubic pressure and left-sided flank pain.  Initially required 7 L nasal cannula.  Admitted for acute on chronic hypoxic respiratory failure, COPD exacerbation, left-sided pyelonephritis.  Consultants None   Procedures/Events   Assessment and Plan: Acute on chronic hypoxic respiratory failure (4 L baseline) Acute COPD exacerbation PMH of lung cancer status post right lower lobe lobectomy, left upper lobe lobectomy Required 7 L initially in the emergency department.  Chest x-ray no acute disease.  COVID, flu, RSV negative.  Lung cancer in remission 5 years. Some improvement though minor.  Continue bronchodilators, steroids, azithromycin .   Left-sided E. coli pyelonephritis Symptomatic on admission, urinalysis grossly abnormal.  CT showing mild left hydroureter and mild left periureteral inflammatory stranding. Urine culture noted.  Continue ceftriaxone  while hospitalized.  On discharge can transition to ciprofloxacin or Levaquin .   Atypical chest pain--resolved Brief, positional in nature.  No EKG done despite order.  BNP and troponin negative.  No further evaluation suggested.  Secondary to cough and COPD.   Anxiety Depression Insomnia Continue Klonopin , Paxil , trazodone    Essential tremor--propranolol  Hyperlipidemia--Crestor  GERD--PPI       Subjective:  Breathing perhaps a little better.  Less abdominal pain.  No back pain.  Physical Exam: Vitals:   11/03/24 0825 11/03/24 0828 11/03/24 1121 11/03/24 1231  BP:    126/72  Pulse:    76  Resp:    18  Temp:    (!) 97.4 F (36.3 C)  TempSrc:      SpO2: 99% 99% 98% 99%   Weight:      Height:       Physical Exam Vitals reviewed.  Constitutional:      General: She is not in acute distress.    Appearance: She is not ill-appearing (Chronically) or toxic-appearing.  Cardiovascular:     Rate and Rhythm: Normal rate and regular rhythm.     Heart sounds: No murmur heard. Pulmonary:     Effort: No respiratory distress.     Breath sounds: No wheezing, rhonchi or rales.     Comments: Mild increased respiratory effort; decreased breath sounds. Neurological:     Mental Status: She is alert.  Psychiatric:        Mood and Affect: Mood normal.        Behavior: Behavior normal.     Data Reviewed: Urine culture noted  Family Communication: none  Disposition: Status is: Inpatient Remains inpatient appropriate because: COPD exacerbation     Time spent: 20 minutes  Author: Toribio Door, MD 11/03/2024 3:32 PM  For on call review [email or web address]    "

## 2024-11-03 NOTE — Plan of Care (Signed)

## 2024-11-03 NOTE — Plan of Care (Signed)
   Problem: Education: Goal: Knowledge of General Education information will improve Description Including pain rating scale, medication(s)/side effects and non-pharmacologic comfort measures Outcome: Progressing   Problem: Health Behavior/Discharge Planning: Goal: Ability to manage health-related needs will improve Outcome: Progressing

## 2024-11-03 NOTE — Telephone Encounter (Signed)
 See 12/7 encounter

## 2024-11-04 ENCOUNTER — Ambulatory Visit: Admitting: Adult Health

## 2024-11-04 ENCOUNTER — Ambulatory Visit

## 2024-11-04 LAB — CBC WITH DIFFERENTIAL/PLATELET
Abs Immature Granulocytes: 0.13 10*3/uL — ABNORMAL HIGH (ref 0.00–0.07)
Basophils Absolute: 0 10*3/uL (ref 0.0–0.1)
Basophils Relative: 0 %
Eosinophils Absolute: 0 10*3/uL (ref 0.0–0.5)
Eosinophils Relative: 0 %
HCT: 41.6 % (ref 36.0–46.0)
Hemoglobin: 13 g/dL (ref 12.0–15.0)
Immature Granulocytes: 2 %
Lymphocytes Relative: 10 %
Lymphs Abs: 0.9 10*3/uL (ref 0.7–4.0)
MCH: 27.3 pg (ref 26.0–34.0)
MCHC: 31.3 g/dL (ref 30.0–36.0)
MCV: 87.4 fL (ref 80.0–100.0)
Monocytes Absolute: 0.1 10*3/uL (ref 0.1–1.0)
Monocytes Relative: 2 %
Neutro Abs: 7.7 10*3/uL (ref 1.7–7.7)
Neutrophils Relative %: 86 %
Platelets: 299 10*3/uL (ref 150–400)
RBC: 4.76 MIL/uL (ref 3.87–5.11)
RDW: 12.8 % (ref 11.5–15.5)
WBC: 8.8 10*3/uL (ref 4.0–10.5)
nRBC: 0 % (ref 0.0–0.2)

## 2024-11-04 LAB — COMPREHENSIVE METABOLIC PANEL WITH GFR
ALT: 11 U/L (ref 0–44)
AST: 19 U/L (ref 15–41)
Albumin: 4.3 g/dL (ref 3.5–5.0)
Alkaline Phosphatase: 87 U/L (ref 38–126)
Anion gap: 12 (ref 5–15)
BUN: 26 mg/dL — ABNORMAL HIGH (ref 8–23)
CO2: 25 mmol/L (ref 22–32)
Calcium: 9.6 mg/dL (ref 8.9–10.3)
Chloride: 101 mmol/L (ref 98–111)
Creatinine, Ser: 0.79 mg/dL (ref 0.44–1.00)
GFR, Estimated: >60 mL/min
Glucose, Bld: 126 mg/dL — ABNORMAL HIGH (ref 70–99)
Potassium: 4.2 mmol/L (ref 3.5–5.1)
Sodium: 138 mmol/L (ref 135–145)
Total Bilirubin: 0.3 mg/dL (ref 0.0–1.2)
Total Protein: 7.7 g/dL (ref 6.5–8.1)

## 2024-11-04 LAB — PHOSPHORUS: Phosphorus: 3.3 mg/dL (ref 2.5–4.6)

## 2024-11-04 LAB — MAGNESIUM: Magnesium: 2.4 mg/dL (ref 1.7–2.4)

## 2024-11-04 MED ORDER — IPRATROPIUM-ALBUTEROL 0.5-2.5 (3) MG/3ML IN SOLN
3.0000 mL | Freq: Three times a day (TID) | RESPIRATORY_TRACT | Status: DC
  Administered 2024-11-05 – 2024-11-06 (×4): 3 mL via RESPIRATORY_TRACT
  Filled 2024-11-04 (×5): qty 3

## 2024-11-04 MED ORDER — ARFORMOTEROL TARTRATE 15 MCG/2ML IN NEBU
15.0000 ug | INHALATION_SOLUTION | Freq: Two times a day (BID) | RESPIRATORY_TRACT | Status: DC
  Administered 2024-11-04 – 2024-11-06 (×4): 15 ug via RESPIRATORY_TRACT
  Filled 2024-11-04 (×4): qty 2

## 2024-11-04 MED ORDER — GUAIFENESIN ER 600 MG PO TB12
1200.0000 mg | ORAL_TABLET | Freq: Two times a day (BID) | ORAL | Status: DC
  Administered 2024-11-04 – 2024-11-06 (×4): 1200 mg via ORAL
  Filled 2024-11-04 (×4): qty 2

## 2024-11-04 MED ORDER — IPRATROPIUM-ALBUTEROL 0.5-2.5 (3) MG/3ML IN SOLN
3.0000 mL | Freq: Four times a day (QID) | RESPIRATORY_TRACT | Status: DC
  Administered 2024-11-04 (×2): 3 mL via RESPIRATORY_TRACT
  Filled 2024-11-04 (×2): qty 3

## 2024-11-04 MED ORDER — BUDESONIDE 0.25 MG/2ML IN SUSP
0.2500 mg | Freq: Two times a day (BID) | RESPIRATORY_TRACT | Status: DC
  Administered 2024-11-04 – 2024-11-06 (×4): 0.25 mg via RESPIRATORY_TRACT
  Filled 2024-11-04 (×4): qty 2

## 2024-11-04 MED ORDER — METHYLPREDNISOLONE SODIUM SUCC 125 MG IJ SOLR
60.0000 mg | Freq: Two times a day (BID) | INTRAMUSCULAR | Status: DC
  Administered 2024-11-04 – 2024-11-06 (×4): 60 mg via INTRAVENOUS
  Filled 2024-11-04 (×6): qty 2

## 2024-11-04 NOTE — Plan of Care (Signed)

## 2024-11-04 NOTE — TOC Initial Note (Signed)
 Transition of Care [facility]) - Initial/Assessment Note    Patient Details  Name: [name] MRN: [id number] Date of Birth: 11-14-1955  [facility] CM/SW Contact:    Doneta Glenys DASEN, RN Phone Number: 11/04/2024, 4:13 PM  Clinical Narrative:                 PTA lives alone;DME-walker, O2 @4L  via [place] with [employer]. Patient sister to transport at discharge. Patient will need travel tank at discharge. IP CM following  Expected Discharge Plan: Home/Self Care Barriers to Discharge: Continued Medical Work up   Patient Goals and CMS Choice Patient states their goals for this hospitalization and ongoing recovery are:: Home   Choice offered to / list presented to : NA      Expected Discharge Plan and Services In-house Referral: NA Discharge Planning Services: CM Consult   Living arrangements for the past 2 months: Single Family Home                 DME Arranged: N/A DME Agency: NA       HH Arranged: NA HH Agency: NA        Prior Living Arrangements/Services Living arrangements for the past 2 months: Single Family Home Lives with:: Self Patient language and need for interpreter reviewed:: Yes Do you feel safe going back to the place where you live?: Yes      Need for Family Participation in Patient Care: Yes (Comment) Care giver support system in place?: Yes (comment) Current home services: DME ([employer]; 4L O2 w/ [employer]) Criminal Activity/Legal Involvement Pertinent to Current Situation/Hospitalization: No - Comment as needed  Activities of Daily Living   ADL Screening (condition at time of admission) Independently performs ADLs?: Yes (appropriate for developmental age) Is the patient deaf or have difficulty hearing?: No Does the patient have difficulty seeing, even when wearing glasses/contacts?: No Does the patient have difficulty concentrating, remembering, or making decisions?: No  Permission Sought/Granted Permission sought to share information  with : Case Manager Permission granted to share information with : Yes, Verbal Permission Granted  Share Information with NAME: Johnson,Lorraine  Sister, Emergency Contact  [phone]  Permission granted to share info w AGENCY: [employer]        Emotional Assessment Appearance:: Appears stated age Attitude/Demeanor/Rapport: Engaged Affect (typically observed): Appropriate Orientation: : Oriented to Self, Oriented to Place, Oriented to  Time, Oriented to Situation Alcohol / Substance Use: Not Applicable Psych Involvement: No (comment)  Admission diagnosis:  Pyelonephritis [N12] COPD exacerbation (HCC) [J44.1] COPD with acute exacerbation (HCC) [J44.1] Patient Active Problem List   Diagnosis Date Noted   Acute pyelonephritis 11/01/2024   COPD with acute exacerbation (HCC) 10/31/2024   Acute on chronic respiratory failure with hypoxia (HCC) 10/31/2024   Complicated UTI (urinary tract infection) 10/31/2024   History of lung cancer 10/31/2024   CAD (coronary artery disease) 05/19/2024   Hyperlipidemia 05/19/2024   Nonrheumatic mitral (valve) stenosis    Palpitations 02/13/2024   Bronchitis    Depression    Dyspnea    Emphysema, unspecified (HCC)    Family history of adverse reaction to anesthesia    Hepatitis C    lung ca    Tremor, unspecified    Mitral stenosis    Community acquired pneumonia 10/03/2023   CAP (community acquired pneumonia) 10/01/2023   Esophageal dysphagia 05/11/2023   Epigastric pain 10/09/2022   Hiatal hernia 10/09/2022   Colon cancer screening 10/09/2022   History of hepatitis C 10/09/2022   Chest  pain 10/09/2022   Pill dysphagia 10/09/2022   Abnormal ultrasound of liver 10/09/2022   Hepatitis B core antibody positive 10/09/2022   Grief reaction 01/24/2022   Allergic rhinitis 10/27/2021   Chronic respiratory failure with hypoxia (HCC) 10/06/2021   Chronic rhinitis 10/06/2021   Pleural effusion 10/06/2021   Acute exacerbation of COPD with asthma  (HCC) 09/18/2021   SOB (shortness of breath) 09/18/2021   Anxiety    COPD GOLD 3  06/29/2021   Adenocarcinoma of right lung, 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   Adenocarcinoma of left lung, 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   Lung mass 03/13/2018   Chronic bronchitis (HCC) 02/04/2018   Left upper lobe pulmonary nodule 02/04/2018   Vaccine counseling 11/13/2017   Cirrhosis (HCC) 11/13/2017   Arthritis of hand 04/17/2017   Hepatitis C, chronic (HCC) 03/14/2015   Tobacco use disorder 01/21/2014   Tremor 10/14/2012   Migraine headache 08/01/2010   Depression, major, single episode, moderate (HCC) 03/19/2008   GERD 03/19/2008   NEPHROLITHIASIS, HX OF 03/19/2008   PCP:  Merna Huxley, NP Pharmacy:   [id number] - [place], [place] - [address] [place] [address] Phone: [phone] Fax: [phone]     Social Drivers of Health (SDOH) Social History: SDOH Screenings   Food Insecurity: No Food Insecurity (11/01/2024)  Housing: Low Risk (11/01/2024)  Transportation Needs: No Transportation Needs (11/01/2024)  Utilities: Not At Risk (11/01/2024)  Alcohol Screen: Low Risk (08/15/2022)  Depression (PHQ2-9): Low Risk (02/13/2024)  Financial Resource Strain: Low Risk (06/07/2024)  Physical Activity: Unknown (06/07/2024)  Social Connections: Socially Isolated (11/01/2024)  Stress: Stress Concern Present (06/07/2024)  Tobacco Use: Medium Risk (10/31/2024)   SDOH Interventions: Social Connections Interventions: Intervention Not Indicated, Inpatient TOC   Readmission Risk Interventions    10/29/2023    3:00 PM 10/03/2023    1:36 PM  Readmission Risk Prevention Plan  Post Dischage Appt Complete Complete  Medication Screening Complete Complete  Transportation Screening Complete Complete

## 2024-11-04 NOTE — Progress Notes (Signed)
 " PROGRESS NOTE    [name]  FMW:[id number] DOB: [birth year] DOA: 10/31/2024 PCP: Merna Huxley, NP   Brief Narrative:  69 year old woman complex PMH including COPD on 4 L nasal cannula, presented with increased shortness of breath, dyspnea on exertion, productive cough and wheezing, dysuria, suprapubic pressure and left-sided flank pain.  Initially required 7 L nasal cannula.  Admitted for acute on chronic hypoxic respiratory failure, COPD exacerbation, left-sided pyelonephritis.  Assessment and Plan:  Acute on chronic hypoxic respiratory failure (4 L baseline) Acute COPD exacerbation PMH of lung cancer status post right lower lobe lobectomy, left upper lobe lobectomy -Required 7 L initially in the emergency department.  Chest x-ray showed no acute Cardiopulmonary abnormalities but did show emphysema.  COVID, flu, RSV negative.  Lung cancer in remission 5 years. -Wean oxygen  to baseline of 4 L.  Given that she was still dyspneic we will discontinue her home Breztri  at this time and favor nebulized medications and switch to DuoNebs Q6 scheduled, Brovana  and budesonide  twice daily.  Increased guaifenesin  to 1200 mg p.o. twice daily and also added flutter valve and incentive spirometry and will be continuing antibiotics with azithromycin  and IV ceftriaxone  as below -C/w Ensifentine 3 mg IH BID and continue with albuterol  2.5 mg neb Q2 as needed for wheezing or shortness of breath -Will need an ambulatory home O2 screen prior to discharge.  PT evaluated today and she ambulated approximately 150 feet and had moderate dyspnea. Continues to have wheezing and conversational dyspnea so increased IV Solu-Medrol  to 60 mg every 12h -Will need close F/U w/ Pulmonary Dr. Darlean in the outpt setting   Left-sided E. coli Pyelonephritis: Symptomatic on admission, urinalysis grossly abnormal as it showed a cloudy appearance with amber-colored urine, large hemoglobin, large leukocytes, positive nitrites,  greater than 300 protein, rare bacteria, greater than 50 RBCs per high-power field, greater than 50 WBCs with urine culture showing E. coli that was resistant to ampicillin and ampicillin sulbactam.  CT showing very mild left hydroureter and mild left periureteral inflammatory stranding and there is no evidence of obstructing renal calculi but is possibly representing the sequela associated with recently passed renal stone. -Continue IV Ceftriaxone  while hospitalized given discussion with the pharmacy staff that cefazolin  and first generation cephalosporins would not be adequate in treating this infection.  On discharge can transition to ciprofloxacin or Levaquin  but will be favoring levofloxacin    Atypical chest pain: Resolved. Brief, positional in nature.  No EKG done despite prior Physician order.  BNP and troponin negative.  No further evaluation suggested.  Secondary to cough and COPD. If needed will add Antiussives    Anxiety / Depression / Insomnia: C/w Clonazepam  0.5 mg po Dailyprn Anxiety, Paroxetine  40 mg po at bedtime, an Trazodone  100 mg po qHSprn Sleep   Essential Tremor: C/w Propanolol 20 mg po qHS  Hyperlipidemia: C/w Rosuvastatin  5 mg po 3x weekly    GERD/GI Prophylaxis: Continue PPI w/ Pantoprazole  40 mg po qHS  Overweight: Complicates overall prognosis and care. Estimated body mass index is 28.68 kg/m as calculated from the following:   Height as of this encounter: 5' 4 (1.626 m).   Weight as of this encounter: 75.8 kg. Weight Loss and Dietary Counseling given    DVT prophylaxis: enoxaparin  (LOVENOX ) injection 40 mg Start: 10/31/24 2200    Code Status: Limited: Do not attempt resuscitation (DNR) -DNR-LIMITED -Do Not Intubate/DNI  Family Communication: No family present @ bedside  Disposition Plan:  Level of care: Med-Surg Status  is: Inpatient Remains inpatient appropriate because: Needs further clinical improvement and anticipating discharge in next 24 to 48 hours if  respiratory status continues to improve.   Consultants:  None  Procedures:  As delineated as above  Antimicrobials:  Anti-infectives (From admission, onward)    Start     Dose/Rate Route Frequency Ordered Stop   11/01/24 1800  cefTRIAXone  (ROCEPHIN ) 1 g in sodium chloride  0.9 % 100 mL IVPB        1 g 200 mL/hr over 30 Minutes Intravenous Every 24 hours 10/31/24 2039     11/01/24 1100  azithromycin  (ZITHROMAX ) tablet 500 mg        500 mg Oral Daily 11/01/24 1057 11/06/24 0959   10/31/24 1815  cefTRIAXone  (ROCEPHIN ) 1 g in sodium chloride  0.9 % 100 mL IVPB        1 g 200 mL/hr over 30 Minutes Intravenous  Once 10/31/24 1803 10/31/24 1907       Subjective: Seen and examined at bedside and thinks he is doing little bit better but continues to have some wheezing this morning and is getting closer to her baseline.  Still having some left-sided flank discomfort but not as bad as what it was when she first came in.  Denies any lightheadedness or dizziness.  No nausea or vomiting.  Objective: Vitals:   11/04/24 0739 11/04/24 0741 11/04/24 1120 11/04/24 1245  BP:    (!) 142/82  Pulse:    83  Resp:    18  Temp:    98 F (36.7 C)  TempSrc:    Oral  SpO2: 99% 99% 98% 98%  Weight:      Height:       No intake or output data in the 24 hours ending 11/04/24 1553 Filed Weights   11/01/24 1451  Weight: 75.8 kg   Examination: Physical Exam:  Constitutional: WN/WD overweight chronically ill-appearing Caucasian female in no acute distress Respiratory: Diminished to auscultation bilaterally with some expiratory wheezing and has some coarse breath sounds and some conversational dyspnea.  Has a normal respiratory effort and is not tachypneic but is wearing supplemental oxygen  nasal cannula at 4 L.  Has mild rhonchi but no appreciable crackles Cardiovascular: RRR, no murmurs / rubs / gallops. S1 and S2 auscultated.  Mild extremity edema. Abdomen: Soft, minimally-tender, distended secondary  to body habitus. Bowel sounds positive.  GU: Deferred. Musculoskeletal: No clubbing / cyanosis of digits/nails. No joint deformity upper and lower extremities. Skin: No rashes, lesions, ulcers on limited skin evaluation. No induration; Warm and dry.  Neurologic: CN 2-12 grossly intact with no focal deficits. Romberg sign and cerebellar reflexes not assessed.  Psychiatric: Normal judgment and insight. Alert and oriented x 3. Normal mood and appropriate affect.   Data Reviewed: I have personally reviewed following labs and imaging studies  CBC: Recent Labs  Lab 10/31/24 1702 11/04/24 0916  WBC 9.3 8.8  NEUTROABS 6.7 7.7  HGB 13.0 13.0  HCT 41.3 41.6  MCV 86.0 87.4  PLT 274 299   Basic Metabolic Panel: Recent Labs  Lab 10/31/24 1702 11/04/24 0916  NA 139 138  K 4.0 4.2  CL 101 101  CO2 28 25  GLUCOSE 119* 126*  BUN 16 26*  CREATININE 0.83 0.79  CALCIUM  10.0 9.6  MG  --  2.4  PHOS  --  3.3   GFR: Estimated Creatinine Clearance: 66.1 mL/min (by C-G formula based on SCr of 0.79 mg/dL). Liver Function Tests: Recent Labs  Lab 10/31/24  1702 11/04/24 0916  AST 19 19  ALT 10 11  ALKPHOS 107 87  BILITOT 0.7 0.3  PROT 8.3* 7.7  ALBUMIN  4.3 4.3   Recent Labs  Lab 10/31/24 1702  LIPASE 14   No results for input(s): AMMONIA in the last 168 hours. Coagulation Profile: No results for input(s): INR, PROTIME in the last 168 hours. Cardiac Enzymes: No results for input(s): CKTOTAL, CKMB, CKMBINDEX, TROPONINI in the last 168 hours. BNP (last 3 results) Recent Labs    10/31/24 1702  PROBNP 138.0   HbA1C: No results for input(s): HGBA1C in the last 72 hours. CBG: No results for input(s): GLUCAP in the last 168 hours. Lipid Profile: No results for input(s): CHOL, HDL, LDLCALC, TRIG, CHOLHDL, LDLDIRECT in the last 72 hours. Thyroid  Function Tests: No results for input(s): TSH, T4TOTAL, FREET4, T3FREE, THYROIDAB in the last 72  hours. Anemia Panel: No results for input(s): VITAMINB12, FOLATE, FERRITIN, TIBC, IRON, RETICCTPCT in the last 72 hours. Sepsis Labs: No results for input(s): PROCALCITON, LATICACIDVEN in the last 168 hours.  Recent Results (from the past 240 hours)  Resp panel by RT-PCR (RSV, Flu A&B, Covid) Anterior Nasal Swab     Status: None   Collection Time: 10/31/24  5:02 PM   Specimen: Anterior Nasal Swab  Result Value Ref Range Status   SARS Coronavirus 2 by RT PCR NEGATIVE NEGATIVE Final    Comment: (NOTE) SARS-CoV-2 target nucleic acids are NOT DETECTED.  The SARS-CoV-2 RNA is generally detectable in upper respiratory specimens during the acute phase of infection. The lowest concentration of SARS-CoV-2 viral copies this assay can detect is 138 copies/mL. A negative result does not preclude SARS-Cov-2 infection and should not be used as the sole basis for treatment or other patient management decisions. A negative result may occur with  improper specimen collection/handling, submission of specimen other than nasopharyngeal swab, presence of viral mutation(s) within the areas targeted by this assay, and inadequate number of viral copies(<138 copies/mL). A negative result must be combined with clinical observations, patient history, and epidemiological information. The expected result is Negative.  Fact Sheet for Patients:  bloggercourse.com  Fact Sheet for Healthcare Providers:  seriousbroker.it  This test is no t yet approved or cleared by the [place]  FDA and  has been authorized for detection and/or diagnosis of SARS-CoV-2 by FDA under an Emergency Use Authorization (EUA). This EUA will remain  in effect (meaning this test can be used) for the duration of the COVID-19 declaration under Section 564(b)(1) of the Act, 21 U.S.C.section 360bbb-3(b)(1), unless the authorization is terminated  or revoked sooner.        Influenza A by PCR NEGATIVE NEGATIVE Final   Influenza B by PCR NEGATIVE NEGATIVE Final    Comment: (NOTE) The Xpert Xpress SARS-CoV-2/FLU/RSV plus assay is intended as an aid in the diagnosis of influenza from Nasopharyngeal swab specimens and should not be used as a sole basis for treatment. Nasal washings and aspirates are unacceptable for Xpert Xpress SARS-CoV-2/FLU/RSV testing.  Fact Sheet for Patients: bloggercourse.com  Fact Sheet for Healthcare Providers: seriousbroker.it  This test is not yet approved or cleared by the [place]  FDA and has been authorized for detection and/or diagnosis of SARS-CoV-2 by FDA under an Emergency Use Authorization (EUA). This EUA will remain in effect (meaning this test can be used) for the duration of the COVID-19 declaration under Section 564(b)(1) of the Act, 21 U.S.C. section 360bbb-3(b)(1), unless the authorization is terminated or revoked.  Resp Syncytial Virus by PCR NEGATIVE NEGATIVE Final    Comment: (NOTE) Fact Sheet for Patients: bloggercourse.com  Fact Sheet for Healthcare Providers: seriousbroker.it  This test is not yet approved or cleared by the [place]  FDA and has been authorized for detection and/or diagnosis of SARS-CoV-2 by FDA under an Emergency Use Authorization (EUA). This EUA will remain in effect (meaning this test can be used) for the duration of the COVID-19 declaration under Section 564(b)(1) of the Act, 21 U.S.C. section 360bbb-3(b)(1), unless the authorization is terminated or revoked.  Performed at [facility], [address], [place], [place] [address]   Urine Culture (for pregnant, neutropenic or urologic patients or patients with an indwelling urinary catheter)     Status: Abnormal   Collection Time: 10/31/24 10:18 PM   Specimen: Urine, Clean Catch  Result Value Ref  Range Status   Specimen Description   Final    URINE, CLEAN CATCH Performed at [facility], [address], [place], [place] [address]    Special Requests   Final    NONE Performed at [facility], [address] [place], [place] [address]    Culture >=100,000 COLONIES/mL ESCHERICHIA COLI (A)  Final   Report Status 11/03/2024 FINAL  Final   Organism ID, Bacteria ESCHERICHIA COLI (A)  Final      Susceptibility   Escherichia coli - MIC*    AMPICILLIN >=32 RESISTANT Resistant     CEFAZOLIN  (URINE) Value in next row Sensitive      8 SENSITIVEThis is a modified FDA-approved test that has been validated and its performance characteristics determined by the reporting laboratory.  This laboratory is certified under the Clinical Laboratory Improvement Amendments CLIA as qualified to perform high complexity clinical laboratory testing.    CEFEPIME Value in next row Sensitive      8 SENSITIVEThis is a modified FDA-approved test that has been validated and its performance characteristics determined by the reporting laboratory.  This laboratory is certified under the Clinical Laboratory Improvement Amendments CLIA as qualified to perform high complexity clinical laboratory testing.    ERTAPENEM Value in next row Sensitive      8 SENSITIVEThis is a modified FDA-approved test that has been validated and its performance characteristics determined by the reporting laboratory.  This laboratory is certified under the Clinical Laboratory Improvement Amendments CLIA as qualified to perform high complexity clinical laboratory testing.    CEFTRIAXONE  Value in next row Sensitive      8 SENSITIVEThis is a modified FDA-approved test that has been validated and its performance characteristics determined by the reporting laboratory.  This laboratory is certified under the Clinical Laboratory Improvement Amendments CLIA as qualified to perform high complexity clinical laboratory  testing.    CIPROFLOXACIN Value in next row Sensitive      8 SENSITIVEThis is a modified FDA-approved test that has been validated and its performance characteristics determined by the reporting laboratory.  This laboratory is certified under the Clinical Laboratory Improvement Amendments CLIA as qualified to perform high complexity clinical laboratory testing.    GENTAMICIN  Value in next row Sensitive      8 SENSITIVEThis is a modified FDA-approved test that has been validated and its performance characteristics determined by the reporting laboratory.  This laboratory is certified under the Clinical Laboratory Improvement Amendments CLIA as qualified to perform high complexity clinical laboratory testing.    NITROFURANTOIN Value in next row Sensitive      8 SENSITIVEThis is a  modified FDA-approved test that has been validated and its performance characteristics determined by the reporting laboratory.  This laboratory is certified under the Clinical Laboratory Improvement Amendments CLIA as qualified to perform high complexity clinical laboratory testing.    TRIMETH/SULFA Value in next row Sensitive      8 SENSITIVEThis is a modified FDA-approved test that has been validated and its performance characteristics determined by the reporting laboratory.  This laboratory is certified under the Clinical Laboratory Improvement Amendments CLIA as qualified to perform high complexity clinical laboratory testing.    AMPICILLIN/SULBACTAM Value in next row Resistant      8 SENSITIVEThis is a modified FDA-approved test that has been validated and its performance characteristics determined by the reporting laboratory.  This laboratory is certified under the Clinical Laboratory Improvement Amendments CLIA as qualified to perform high complexity clinical laboratory testing.    PIP/TAZO Value in next row Sensitive      <=4 SENSITIVEThis is a modified FDA-approved test that has been validated and its performance  characteristics determined by the reporting laboratory.  This laboratory is certified under the Clinical Laboratory Improvement Amendments CLIA as qualified to perform high complexity clinical laboratory testing.    MEROPENEM Value in next row Sensitive      <=4 SENSITIVEThis is a modified FDA-approved test that has been validated and its performance characteristics determined by the reporting laboratory.  This laboratory is certified under the Clinical Laboratory Improvement Amendments CLIA as qualified to perform high complexity clinical laboratory testing.    * >=100,000 COLONIES/mL ESCHERICHIA COLI    Radiology Studies: No results found.  Scheduled Meds:  arformoterol   15 mcg Nebulization BID   azithromycin   500 mg Oral Daily   budesonide  (PULMICORT ) nebulizer solution  0.25 mg Nebulization BID   enoxaparin  (LOVENOX ) injection  40 mg Subcutaneous Q24H   Ensifentrine   3 mg Inhalation BID   fluticasone   2 spray Each Nare Daily   guaiFENesin   1,200 mg Oral BID   ipratropium-albuterol   3 mL Nebulization Q6H   methylPREDNISolone  (SOLU-MEDROL ) injection  60 mg Intravenous Q12H   pantoprazole   40 mg Oral QHS   PARoxetine   40 mg Oral QHS   propranolol   20 mg Oral QHS   rosuvastatin   5 mg Oral Once per day on Monday Wednesday Friday   Continuous Infusions:  cefTRIAXone  (ROCEPHIN )  IV 1 g (11/03/24 1815)    LOS: 4 days   Alejandro Marker, DO [facility] Available via Epic secure chat 7am-7pm After these hours, please refer to coverage provider listed on [email or web address] 11/04/2024, 3:53 PM  "

## 2024-11-04 NOTE — Evaluation (Signed)
 Physical Therapy Evaluation-1x Patient Details Name: [name] MRN: [id number] DOB: 1956-08-23 Today's Date: 11/04/2024  History of Present Illness  69 yo female admitted with COPD exac, pyelonephritis, atypical chest pain. Hx of COPD-O2 dep 4L, lung Ca-lobectomy, anxiety/depression, essential tremor, migraines.  Clinical Impression  On eval, pt was Mod Ind with mobility. She ambulated ~150 feet with intermittent use of hallway handrail. She required 1 standing rest break after ~75 feet. O2: 97% on 4L resting, 94% on 4L ambulating with moderate dyspnea. No acute PT needs-pt in agreement. She feels she is mobilizing near her baseline. 1x eval. Will sign off.         If plan is discharge home, recommend the following:     Can travel by private vehicle        Equipment Recommendations None recommended by PT  Recommendations for Other Services       Functional Status Assessment Patient has had a recent decline in their functional status and demonstrates the ability to make significant improvements in function in a reasonable and predictable amount of time.     Precautions / Restrictions Precautions Precautions: None Precaution/Restrictions Comments: monitor O2-O2 dep at baseline Restrictions Weight Bearing Restrictions Per Provider Order: No      Mobility  Bed Mobility Overal bed mobility: Modified Independent                  Transfers Overall transfer level: Modified independent                      Ambulation/Gait Ambulation/Gait assistance: Modified independent (Device/Increase time) Gait Distance (Feet): 150 Feet Assistive device: None (intermittent handrail use) Gait Pattern/deviations: Step-through pattern, Decreased stride length       General Gait Details: Slow, guarded gait. Intermittent unsteadness but no overt LOB. 1 standing rest break after ~75 feet. O2 94% on 4L. Cues for pursed lip breathing.  Stairs             Wheelchair Mobility     Tilt Bed    Modified Rankin (Stroke Patients Only)       Balance Overall balance assessment: Mild deficits observed, not formally tested                                           Pertinent Vitals/Pain Pain Assessment Pain Assessment: No/denies pain    Home Living Family/patient expects to be discharged to:: Private residence Living Arrangements: Alone Available Help at Discharge: Family Type of Home: House         Home Layout: One level Home Equipment: Rollator (4 wheels);Cane - single point Additional Comments: wears O2 at baseline    Prior Function Prior Level of Function : Independent/Modified Independent                     Extremity/Trunk Assessment   Upper Extremity Assessment Upper Extremity Assessment: Overall WFL for tasks assessed    Lower Extremity Assessment Lower Extremity Assessment: Generalized weakness    Cervical / Trunk Assessment Cervical / Trunk Assessment: Normal  Communication   Communication Communication: No apparent difficulties    Cognition Arousal: Alert Behavior During Therapy: WFL for tasks assessed/performed   PT - Cognitive impairments: No apparent impairments  Following commands: Intact       Cueing Cueing Techniques: Verbal cues     General Comments      Exercises     Assessment/Plan    PT Assessment Patient does not need any further PT services  PT Problem List         PT Treatment Interventions      PT Goals (Current goals can be found in the Care Plan section)  Acute Rehab PT Goals Patient Stated Goal: home PT Goal Formulation: All assessment and education complete, DC therapy    Frequency       Co-evaluation               AM-PAC PT 6 Clicks Mobility  Outcome Measure Help needed turning from your back to your side while in a flat bed without using bedrails?: None Help needed moving from lying on  your back to sitting on the side of a flat bed without using bedrails?: None Help needed moving to and from a bed to a chair (including a wheelchair)?: None Help needed standing up from a chair using your arms (e.g., wheelchair or bedside chair)?: None Help needed to walk in hospital room?: None Help needed climbing 3-5 steps with a railing? : None 6 Click Score: 24    End of Session Equipment Utilized During Treatment: Oxygen  Activity Tolerance: Patient tolerated treatment well Patient left: in bed;with call bell/phone within reach        Time: 1135-1143 PT Time Calculation (min) (ACUTE ONLY): 8 min   Charges:   PT Evaluation $PT Eval Low Complexity: 1 Low   PT General Charges $$ ACUTE PT VISIT: 1 Visit            Dannial SQUIBB, PT Acute Rehabilitation  Office: [phone]

## 2024-11-05 ENCOUNTER — Encounter (HOSPITAL_COMMUNITY): Payer: Self-pay | Admitting: Internal Medicine

## 2024-11-05 ENCOUNTER — Inpatient Hospital Stay (HOSPITAL_COMMUNITY)

## 2024-11-05 LAB — CBC WITH DIFFERENTIAL/PLATELET
Abs Immature Granulocytes: 0.24 10*3/uL — ABNORMAL HIGH (ref 0.00–0.07)
Basophils Absolute: 0 10*3/uL (ref 0.0–0.1)
Basophils Relative: 0 %
Eosinophils Absolute: 0 10*3/uL (ref 0.0–0.5)
Eosinophils Relative: 0 %
HCT: 37.2 % (ref 36.0–46.0)
Hemoglobin: 11.9 g/dL — ABNORMAL LOW (ref 12.0–15.0)
Immature Granulocytes: 4 %
Lymphocytes Relative: 8 %
Lymphs Abs: 0.5 10*3/uL — ABNORMAL LOW (ref 0.7–4.0)
MCH: 27.2 pg (ref 26.0–34.0)
MCHC: 32 g/dL (ref 30.0–36.0)
MCV: 85.1 fL (ref 80.0–100.0)
Monocytes Absolute: 0.2 10*3/uL (ref 0.1–1.0)
Monocytes Relative: 3 %
Neutro Abs: 5.5 10*3/uL (ref 1.7–7.7)
Neutrophils Relative %: 85 %
Platelets: 242 10*3/uL (ref 150–400)
RBC: 4.37 MIL/uL (ref 3.87–5.11)
RDW: 12.7 % (ref 11.5–15.5)
WBC: 6.5 10*3/uL (ref 4.0–10.5)
nRBC: 0 % (ref 0.0–0.2)

## 2024-11-05 LAB — COMPREHENSIVE METABOLIC PANEL WITH GFR
ALT: 11 U/L (ref 0–44)
AST: 14 U/L — ABNORMAL LOW (ref 15–41)
Albumin: 3.8 g/dL (ref 3.5–5.0)
Alkaline Phosphatase: 76 U/L (ref 38–126)
Anion gap: 10 (ref 5–15)
BUN: 26 mg/dL — ABNORMAL HIGH (ref 8–23)
CO2: 26 mmol/L (ref 22–32)
Calcium: 8.9 mg/dL (ref 8.9–10.3)
Chloride: 103 mmol/L (ref 98–111)
Creatinine, Ser: 0.8 mg/dL (ref 0.44–1.00)
GFR, Estimated: >60 mL/min
Glucose, Bld: 128 mg/dL — ABNORMAL HIGH (ref 70–99)
Potassium: 4.4 mmol/L (ref 3.5–5.1)
Sodium: 139 mmol/L (ref 135–145)
Total Bilirubin: <0.2 mg/dL (ref 0.0–1.2)
Total Protein: 6.6 g/dL (ref 6.5–8.1)

## 2024-11-05 LAB — PHOSPHORUS: Phosphorus: 3.4 mg/dL (ref 2.5–4.6)

## 2024-11-05 LAB — MAGNESIUM: Magnesium: 2.3 mg/dL (ref 1.7–2.4)

## 2024-11-05 MED ORDER — METRONIDAZOLE 500 MG/100ML IV SOLN
500.0000 mg | Freq: Two times a day (BID) | INTRAVENOUS | Status: DC
  Administered 2024-11-05 – 2024-11-06 (×2): 500 mg via INTRAVENOUS
  Filled 2024-11-05 (×2): qty 100

## 2024-11-05 NOTE — Progress Notes (Signed)
 " PROGRESS NOTE    [name]  FMW:[id number] DOB: [birth year] DOA: 10/31/2024 PCP: Merna Huxley, NP   Brief Narrative:  69 year old woman complex PMH including COPD on 4 L nasal cannula, presented with increased shortness of breath, dyspnea on exertion, productive cough and wheezing, dysuria, suprapubic pressure and left-sided flank pain.  Initially required 7 L nasal cannula.  Admitted for acute on chronic hypoxic respiratory failure, COPD exacerbation, left-sided pyelonephritis. Now found to have a New RUL PNA so will evaluate Aspiration for her.   Assessment and Plan:  Acute on chronic hypoxic respiratory failure (4 L baseline) Acute COPD exacerbation RUL PNA, ? Aspiration PMH of lung cancer status post right lower lobe lobectomy, left upper lobe lobectomy -Required 7 L initially in the emergency department.  Initial Chest x-ray showed no acute Cardiopulmonary abnormalities but did show emphysema.  COVID, flu, RSV negative.  Lung cancer in remission 5 years. -Wean oxygen  to baseline of 4 L.  Given that she was still dyspneic we will discontinue her home Breztri  at this time and favor nebulized medications and switch to DuoNebs Q6 scheduled, Brovana  and budesonide  twice daily.  Increased guaifenesin  to 1200 mg p.o. twice daily and also added flutter valve and incentive spirometry and will be continuing antibiotics with azithromycin  and IV ceftriaxone  as below; Add Flagyl  given concern for ? Aspiration  -C/w Ensifentine 3 mg IH BID and continue with albuterol  2.5 mg neb Q2 as needed for wheezing or shortness of breath; On DuoNeb 3 mL TID -Will need an ambulatory home O2 screen prior to discharge.  PT evaluated today and she ambulated approximately 150 feet and had moderate dyspnea. Continues to have wheezing and conversational dyspnea so increased IV Solu-Medrol  to 60 mg every 12h -Repeat CXR 11/05/24 showed New airspace disease in the right upper lobe suspicious for Pneumonia;  Currently she is Afebrile, has no Leukocytosis and Not coughing up much sputum. C/w Nebs and Abx. Patient concerned about new PNA so obtain SLP and place on Aspiration Precautions. Undergoing MBS in the AM. Pulmonary to see after given patient request and concern  -Will need close F/U w/ Pulmonary Dr. Darlean in the outpt setting but the Inpt team to evaluate 11/06/24   Left-sided E. Coli Pyelonephritis: Improving. Symptomatic on admission, urinalysis grossly abnormal as it showed a cloudy appearance with amber-colored urine, large hemoglobin, large leukocytes, positive nitrites, greater than 300 protein, rare bacteria, greater than 50 RBCs per high-power field, greater than 50 WBCs with urine culture showing E. coli that was resistant to ampicillin and ampicillin sulbactam.  CT showing very mild left hydroureter and mild left periureteral inflammatory stranding and there is no evidence of obstructing renal calculi but is possibly representing the sequela associated with recently passed renal stone. -Continue IV Ceftriaxone  while hospitalized given discussion with the pharmacy staff that cefazolin  and first generation cephalosporins would not be adequate in treating this infection.  On discharge can transition to ciprofloxacin or Levaquin  but will be favoring levofloxacin    Atypical Chest Pain: Resolved. Brief, positional in nature.  No EKG done despite prior Physician order.  BNP and troponin negative.  No further evaluation suggested.  Secondary to cough and COPD. If needed will add Antiussives but cough is non-productive   Anxiety / Depression / Insomnia: C/w Clonazepam  0.5 mg po Dailyprn Anxiety, Paroxetine  40 mg po at bedtime, an Trazodone  100 mg po qHSprn Sleep   Essential Tremor: C/w Propanolol 20 mg po qHS  Hyperlipidemia: C/w Rosuvastatin  5 mg po 3x  weekly   Normocytic Anemia: Hgb/Hct Trend:  Recent Labs  Lab 10/31/24 1702 11/04/24 0916 11/05/24 0446  HGB 13.0 13.0 11.9*  HCT 41.3 41.6 37.2   MCV 86.0 87.4 85.1  -Check Anemia Panel in the AM. CTM for S/Sx of Bleeding; No overt bleeding. Repeat CBC in the AM   GERD/GI Prophylaxis: Continue PPI w/ Pantoprazole  40 mg po at bedtime  Overweight: Complicates overall prognosis and care. Estimated body mass index is 28.68 kg/m as calculated from the following:   Height as of this encounter: 5' 4 (1.626 m).   Weight as of this encounter: 75.8 kg. Weight Loss and Dietary Counseling given    DVT prophylaxis: enoxaparin  (LOVENOX ) injection 40 mg Start: 10/31/24 2200    Code Status: Limited: Do not attempt resuscitation (DNR) -DNR-LIMITED -Do Not Intubate/DNI  Family Communication: No family present @ bedside  Disposition Plan:  Level of care: Med-Surg Status is: Inpatient Remains inpatient appropriate because: Needs further evaluation of PNA and Pulmonary to see 11/06/24. Anticipating D/C in the next 24-48 hours   Consultants:  Pulmonary to see 11/06/24  Procedures:  As delineated as above  Antimicrobials:  Anti-infectives (From admission, onward)    Start     Dose/Rate Route Frequency Ordered Stop   11/05/24 1945  metroNIDAZOLE  (FLAGYL ) IVPB 500 mg        500 mg 100 mL/hr over 60 Minutes Intravenous Every 12 hours 11/05/24 1847     11/01/24 1800  cefTRIAXone  (ROCEPHIN ) 1 g in sodium chloride  0.9 % 100 mL IVPB        1 g 200 mL/hr over 30 Minutes Intravenous Every 24 hours 10/31/24 2039     11/01/24 1100  azithromycin  (ZITHROMAX ) tablet 500 mg        500 mg Oral Daily 11/01/24 1057 11/05/24 1054   10/31/24 1815  cefTRIAXone  (ROCEPHIN ) 1 g in sodium chloride  0.9 % 100 mL IVPB        1 g 200 mL/hr over 30 Minutes Intravenous  Once 10/31/24 1803 10/31/24 1907       Subjective: Seen and examined at bedside and was very anxious about having a new pneumonia on imaging.  States that she coughs when she eats and there is concern for aspiration.  Denies any worsening shortness of breath but cough is nonproductive.  Has not been  febrile.  States her abdominal discomfort and burning is improved significantly.  No other concerns or complaints at this time.  Objective: Vitals:   11/05/24 0654 11/05/24 0803 11/05/24 1312 11/05/24 1526  BP: 138/74  125/70   Pulse: 65  77   Resp:   18   Temp: 97.9 F (36.6 C)  97.7 F (36.5 C)   TempSrc: Oral     SpO2: 98% 99% 99% 99%  Weight:      Height:        Intake/Output Summary (Last 24 hours) at 11/05/2024 1848 Last data filed at 11/05/2024 0911 Gross per 24 hour  Intake 440 ml  Output --  Net 440 ml   Filed Weights   11/01/24 1451  Weight: 75.8 kg   Examination: Physical Exam:  Constitutional: WN/WD overweight chronically ill-appearing very anxious Caucasian female Respiratory: Diminished to auscultation bilaterally with some coarse breath sounds slightly worse on the right compared to the left and some slight noted to crackles.  Wheezing is significantly improved compared to yesterday.  Normal respiratory effort and patient is not tachypenic. No accessory muscle use.  Wearing supplemental oxygen  nasal cannula  Cardiovascular: RRR, no murmurs / rubs / gallops. S1 and S2 auscultated. No extremity edema Abdomen: Soft, non-tender, slightly distended secondary to body habitus. Bowel sounds positive.  GU: Deferred. Musculoskeletal: No clubbing / cyanosis of digits/nails. No joint deformity upper and lower extremities.  Skin: No rashes, lesions, ulcers on limited skin evaluation. No induration; Warm and dry.  Neurologic: CN 2-12 grossly intact with no focal deficits. Romberg sign and cerebellar reflexes not assessed.  Psychiatric: Very anxious a little frustrated and agitated.  Data Reviewed: I have personally reviewed following labs and imaging studies  CBC: Recent Labs  Lab 10/31/24 1702 11/04/24 0916 11/05/24 0446  WBC 9.3 8.8 6.5  NEUTROABS 6.7 7.7 5.5  HGB 13.0 13.0 11.9*  HCT 41.3 41.6 37.2  MCV 86.0 87.4 85.1  PLT 274 299 242   Basic Metabolic  Panel: Recent Labs  Lab 10/31/24 1702 11/04/24 0916 11/05/24 0446  NA 139 138 139  K 4.0 4.2 4.4  CL 101 101 103  CO2 28 25 26   GLUCOSE 119* 126* 128*  BUN 16 26* 26*  CREATININE 0.83 0.79 0.80  CALCIUM  10.0 9.6 8.9  MG  --  2.4 2.3  PHOS  --  3.3 3.4   GFR: Estimated Creatinine Clearance: 66.1 mL/min (by C-G formula based on SCr of 0.8 mg/dL). Liver Function Tests: Recent Labs  Lab 10/31/24 1702 11/04/24 0916 11/05/24 0446  AST 19 19 14*  ALT 10 11 11   ALKPHOS 107 87 76  BILITOT 0.7 0.3 <0.2  PROT 8.3* 7.7 6.6  ALBUMIN  4.3 4.3 3.8   Recent Labs  Lab 10/31/24 1702  LIPASE 14   No results for input(s): AMMONIA in the last 168 hours. Coagulation Profile: No results for input(s): INR, PROTIME in the last 168 hours. Cardiac Enzymes: No results for input(s): CKTOTAL, CKMB, CKMBINDEX, TROPONINI in the last 168 hours. BNP (last 3 results) Recent Labs    10/31/24 1702  PROBNP 138.0   HbA1C: No results for input(s): HGBA1C in the last 72 hours. CBG: No results for input(s): GLUCAP in the last 168 hours. Lipid Profile: No results for input(s): CHOL, HDL, LDLCALC, TRIG, CHOLHDL, LDLDIRECT in the last 72 hours. Thyroid  Function Tests: No results for input(s): TSH, T4TOTAL, FREET4, T3FREE, THYROIDAB in the last 72 hours. Anemia Panel: No results for input(s): VITAMINB12, FOLATE, FERRITIN, TIBC, IRON, RETICCTPCT in the last 72 hours. Sepsis Labs: No results for input(s): PROCALCITON, LATICACIDVEN in the last 168 hours.  Recent Results (from the past 240 hours)  Resp panel by RT-PCR (RSV, Flu A&B, Covid) Anterior Nasal Swab     Status: None   Collection Time: 10/31/24  5:02 PM   Specimen: Anterior Nasal Swab  Result Value Ref Range Status   SARS Coronavirus 2 by RT PCR NEGATIVE NEGATIVE Final    Comment: (NOTE) SARS-CoV-2 target nucleic acids are NOT DETECTED.  The SARS-CoV-2 RNA is generally detectable in  upper respiratory specimens during the acute phase of infection. The lowest concentration of SARS-CoV-2 viral copies this assay can detect is 138 copies/mL. A negative result does not preclude SARS-Cov-2 infection and should not be used as the sole basis for treatment or other patient management decisions. A negative result may occur with  improper specimen collection/handling, submission of specimen other than nasopharyngeal swab, presence of viral mutation(s) within the areas targeted by this assay, and inadequate number of viral copies(<138 copies/mL). A negative result must be combined with clinical observations, patient history, and epidemiological information. The expected result is  Negative.  Fact Sheet for Patients:  bloggercourse.com  Fact Sheet for Healthcare Providers:  seriousbroker.it  This test is no t yet approved or cleared by the [place]  FDA and  has been authorized for detection and/or diagnosis of SARS-CoV-2 by FDA under an Emergency Use Authorization (EUA). This EUA will remain  in effect (meaning this test can be used) for the duration of the COVID-19 declaration under Section 564(b)(1) of the Act, 21 U.S.C.section 360bbb-3(b)(1), unless the authorization is terminated  or revoked sooner.       Influenza A by PCR NEGATIVE NEGATIVE Final   Influenza B by PCR NEGATIVE NEGATIVE Final    Comment: (NOTE) The Xpert Xpress SARS-CoV-2/FLU/RSV plus assay is intended as an aid in the diagnosis of influenza from Nasopharyngeal swab specimens and should not be used as a sole basis for treatment. Nasal washings and aspirates are unacceptable for Xpert Xpress SARS-CoV-2/FLU/RSV testing.  Fact Sheet for Patients: bloggercourse.com  Fact Sheet for Healthcare Providers: seriousbroker.it  This test is not yet approved or cleared by the [place]  FDA and has been  authorized for detection and/or diagnosis of SARS-CoV-2 by FDA under an Emergency Use Authorization (EUA). This EUA will remain in effect (meaning this test can be used) for the duration of the COVID-19 declaration under Section 564(b)(1) of the Act, 21 U.S.C. section 360bbb-3(b)(1), unless the authorization is terminated or revoked.     Resp Syncytial Virus by PCR NEGATIVE NEGATIVE Final    Comment: (NOTE) Fact Sheet for Patients: bloggercourse.com  Fact Sheet for Healthcare Providers: seriousbroker.it  This test is not yet approved or cleared by the [place]  FDA and has been authorized for detection and/or diagnosis of SARS-CoV-2 by FDA under an Emergency Use Authorization (EUA). This EUA will remain in effect (meaning this test can be used) for the duration of the COVID-19 declaration under Section 564(b)(1) of the Act, 21 U.S.C. section 360bbb-3(b)(1), unless the authorization is terminated or revoked.  Performed at [facility], [address], [place], [place] [address]   Urine Culture (for pregnant, neutropenic or urologic patients or patients with an indwelling urinary catheter)     Status: Abnormal   Collection Time: 10/31/24 10:18 PM   Specimen: Urine, Clean Catch  Result Value Ref Range Status   Specimen Description   Final    URINE, CLEAN CATCH Performed at [name], [address], [place], [place] [address]    Special Requests   Final    NONE Performed at [facility], [address], [place], [place] [address]    Culture >=100,000 COLONIES/mL ESCHERICHIA COLI (A)  Final   Report Status 11/03/2024 FINAL  Final   Organism ID, Bacteria ESCHERICHIA COLI (A)  Final      Susceptibility   Escherichia coli - MIC*    AMPICILLIN >=32 RESISTANT Resistant     CEFAZOLIN  (URINE) Value in next row Sensitive      8 SENSITIVEThis is a modified FDA-approved  test that has been validated and its performance characteristics determined by the reporting laboratory.  This laboratory is certified under the Clinical Laboratory Improvement Amendments CLIA as qualified to perform high complexity clinical laboratory testing.    CEFEPIME Value in next row Sensitive      8 SENSITIVEThis is a modified FDA-approved test that has been validated and its performance characteristics determined by the reporting laboratory.  This laboratory is certified under the Clinical Laboratory Improvement Amendments CLIA as qualified to perform high  complexity clinical laboratory testing.    ERTAPENEM Value in next row Sensitive      8 SENSITIVEThis is a modified FDA-approved test that has been validated and its performance characteristics determined by the reporting laboratory.  This laboratory is certified under the Clinical Laboratory Improvement Amendments CLIA as qualified to perform high complexity clinical laboratory testing.    CEFTRIAXONE  Value in next row Sensitive      8 SENSITIVEThis is a modified FDA-approved test that has been validated and its performance characteristics determined by the reporting laboratory.  This laboratory is certified under the Clinical Laboratory Improvement Amendments CLIA as qualified to perform high complexity clinical laboratory testing.    CIPROFLOXACIN Value in next row Sensitive      8 SENSITIVEThis is a modified FDA-approved test that has been validated and its performance characteristics determined by the reporting laboratory.  This laboratory is certified under the Clinical Laboratory Improvement Amendments CLIA as qualified to perform high complexity clinical laboratory testing.    GENTAMICIN  Value in next row Sensitive      8 SENSITIVEThis is a modified FDA-approved test that has been validated and its performance characteristics determined by the reporting laboratory.  This laboratory is certified under the Clinical Laboratory Improvement  Amendments CLIA as qualified to perform high complexity clinical laboratory testing.    NITROFURANTOIN Value in next row Sensitive      8 SENSITIVEThis is a modified FDA-approved test that has been validated and its performance characteristics determined by the reporting laboratory.  This laboratory is certified under the Clinical Laboratory Improvement Amendments CLIA as qualified to perform high complexity clinical laboratory testing.    TRIMETH/SULFA Value in next row Sensitive      8 SENSITIVEThis is a modified FDA-approved test that has been validated and its performance characteristics determined by the reporting laboratory.  This laboratory is certified under the Clinical Laboratory Improvement Amendments CLIA as qualified to perform high complexity clinical laboratory testing.    AMPICILLIN/SULBACTAM Value in next row Resistant      8 SENSITIVEThis is a modified FDA-approved test that has been validated and its performance characteristics determined by the reporting laboratory.  This laboratory is certified under the Clinical Laboratory Improvement Amendments CLIA as qualified to perform high complexity clinical laboratory testing.    PIP/TAZO Value in next row Sensitive      <=4 SENSITIVEThis is a modified FDA-approved test that has been validated and its performance characteristics determined by the reporting laboratory.  This laboratory is certified under the Clinical Laboratory Improvement Amendments CLIA as qualified to perform high complexity clinical laboratory testing.    MEROPENEM Value in next row Sensitive      <=4 SENSITIVEThis is a modified FDA-approved test that has been validated and its performance characteristics determined by the reporting laboratory.  This laboratory is certified under the Clinical Laboratory Improvement Amendments CLIA as qualified to perform high complexity clinical laboratory testing.    * >=100,000 COLONIES/mL ESCHERICHIA COLI    Radiology Studies: DG  CHEST PORT 1 VIEW Result Date: 11/05/2024 CLINICAL DATA:  Shortness of breath. EXAM: PORTABLE CHEST 1 VIEW COMPARISON:  10/31/2024 FINDINGS: New airspace disease identified in the right upper lobe suspicious for pneumonia. Surgical staple lines noted in both lungs. The cardiopericardial silhouette is within normal limits for size. No acute bony abnormality. IMPRESSION: New airspace disease in the right upper lobe suspicious for pneumonia. Electronically Signed   By: Camellia Candle M.D.   On: 11/05/2024 07:20   Scheduled Meds:  arformoterol   15 mcg Nebulization BID   budesonide  (PULMICORT ) nebulizer solution  0.25 mg Nebulization BID   enoxaparin  (LOVENOX ) injection  40 mg Subcutaneous Q24H   Ensifentrine   3 mg Inhalation BID   fluticasone   2 spray Each Nare Daily   guaiFENesin   1,200 mg Oral BID   ipratropium-albuterol   3 mL Nebulization TID   methylPREDNISolone  (SOLU-MEDROL ) injection  60 mg Intravenous Q12H   pantoprazole   40 mg Oral QHS   PARoxetine   40 mg Oral QHS   propranolol   20 mg Oral QHS   rosuvastatin   5 mg Oral Once per day on Monday Wednesday Friday   Continuous Infusions:  cefTRIAXone  (ROCEPHIN )  IV 1 g (11/05/24 1704)   metronidazole       LOS: 5 days   Alejandro Marker, DO [facility] Available via Epic secure chat 7am-7pm After these hours, please refer to coverage provider listed on [email or web address] 11/05/2024, 6:48 PM  "

## 2024-11-05 NOTE — TOC Progression Note (Signed)
 Transition of Care [facility]) - Progression Note    Patient Details  Name: [name] MRN: [id number] Date of Birth: 1955/11/24  [facility] CM/SW Contact  Doneta Glenys DASEN, RN Phone Number: 11/05/2024, 4:08 PM  Clinical Narrative:    CM spoke with [employer] will deliver travel tank on 2/6.   Expected Discharge Plan: Home/Self Care Barriers to Discharge: Continued Medical Work up               Expected Discharge Plan and Services In-house Referral: NA Discharge Planning Services: CM Consult   Living arrangements for the past 2 months: Single Family Home                 DME Arranged: N/A DME Agency: NA       HH Arranged: NA HH Agency: NA         Social Drivers of Health (SDOH) Interventions SDOH Screenings   Food Insecurity: No Food Insecurity (11/01/2024)  Housing: Low Risk (11/01/2024)  Transportation Needs: No Transportation Needs (11/01/2024)  Utilities: Not At Risk (11/01/2024)  Alcohol Screen: Low Risk (08/15/2022)  Depression (PHQ2-9): Low Risk (02/13/2024)  Financial Resource Strain: Low Risk (06/07/2024)  Physical Activity: Unknown (06/07/2024)  Social Connections: Socially Isolated (11/01/2024)  Stress: Stress Concern Present (06/07/2024)  Tobacco Use: Medium Risk (10/31/2024)    Readmission Risk Interventions    10/29/2023    3:00 PM 10/03/2023    1:36 PM  Readmission Risk Prevention Plan  Post Dischage Appt Complete Complete  Medication Screening Complete Complete  Transportation Screening Complete Complete

## 2024-11-05 NOTE — Evaluation (Signed)
 Occupational Therapy Evaluation/Discharge Patient Details Name: [name] MRN: [id number] DOB: 1956-01-14 Today's Date: 11/05/2024   History of Present Illness   69 yo female admitted with COPD exac, pyelonephritis, atypical chest pain. Hx of COPD-O2 dep 4L, lung Ca-lobectomy, anxiety/depression, essential tremor, migraines.     Clinical Impressions One time OT visit completed with focus on patient education and energy conservation strategies. At this time, pt verbalizes adequate understanding of compensatory techniques and safety recommendations. No further skilled OT services are indicated. Patient is appropriate to continue daily activities utilizing recommended strategies and supports at home. OT will sign off.       Functional Status Assessment   Patient has not had a recent decline in their functional status     Equipment Recommendations   None recommended by OT      Precautions/Restrictions   Precautions Precautions: Other (comment) Precaution/Restrictions Comments: monitor O2-O2 dep at baseline Restrictions Weight Bearing Restrictions Per Provider Order: No     Mobility Bed Mobility Overal bed mobility: Modified Independent     Transfers Overall transfer level: Modified independent           ADL either performed or assessed with clinical judgement   ADL Overall ADL's : Independent;Modified independent;At baseline           Vision Baseline Vision/History: 0 No visual deficits Ability to See in Adequate Light: 0 Adequate Patient Visual Report: No change from baseline              Pertinent Vitals/Pain Pain Assessment Pain Assessment: No/denies pain     Extremity/Trunk Assessment Upper Extremity Assessment Upper Extremity Assessment: Overall WFL for tasks assessed   Lower Extremity Assessment Lower Extremity Assessment: Defer to PT evaluation   Cervical / Trunk Assessment Cervical / Trunk Assessment: Normal    Communication Communication Communication: No apparent difficulties   Cognition Arousal: Alert Behavior During Therapy: WFL for tasks assessed/performed Cognition: No apparent impairments    Following commands: Intact       Cueing  General Comments   Cueing Techniques: Verbal cues  Pt education on energy conservation strategies currently utilized within daily routines, with additional instruction provided to optimize safety and efficiency during bathing tasks. Education provided on environmental set-up and seated positioning as appropriate to reduce SOB and fatigue. Recommended use of neck lanyard or fanny pack to keep cell phone with her in an event of a fall. Pt verbalized understanding of education provided and awareness of safety recommendations.           Home Living Family/patient expects to be discharged to:: Private residence Living Arrangements: Other (Comment) (Has a roommate that works so she is there in the evenings and on the weekends) Available Help at Discharge: Family;Available PRN/intermittently Type of Home: House       Home Layout: One level        Home Equipment: Rollator (4 wheels);Cane - single point;Shower seat   Additional Comments: wears O2 at baseline. Has a lab and a cockatoo      Prior Functioning/Environment Prior Level of Function : Independent/Modified Independent     OT Problem List: Decreased activity tolerance        OT Goals(Current goals can be found in the care plan section)   Acute Rehab OT Goals OT Goal Formulation: All assessment and education complete, DC therapy   OT Frequency:   1 time visit       AM-PAC OT 6 Clicks Daily Activity     Outcome Measure Help  from another person eating meals?: None Help from another person taking care of personal grooming?: None Help from another person toileting, which includes using toliet, bedpan, or urinal?: None Help from another person bathing (including washing, rinsing,  drying)?: None Help from another person to put on and taking off regular upper body clothing?: None Help from another person to put on and taking off regular lower body clothing?: None 6 Click Score: 24   End of Session Equipment Utilized During Treatment: Oxygen   Activity Tolerance: Patient tolerated treatment well Patient left: in bed;with call bell/phone within reach  OT Visit Diagnosis: Muscle weakness (generalized) (M62.81)                Time: 1050-1102 OT Time Calculation (min): 12 min Charges:  OT General Charges $OT Visit: 1 Visit OT Evaluation $OT Eval Low Complexity: 1 Low  Leita Howell, OTR/L,CBIS  Supplemental OT - [facility] and [facility] Secure Chat Preferred    Agamjot Kilgallon, Leita BIRCH 11/05/2024, 11:15 AM

## 2024-11-05 NOTE — Evaluation (Signed)
 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 prior to study with DLCO  10.06 (48%) corrects to 2.32 (55%)  for alv volume and FV curve classic concave exp loop  - 06/29/2021  After extensive coaching inhaler device,  effectiveness =    90% with dpi, 75% with hfa    - 06/29/2021   Walked on RA x  3  lap(s) =  approx 750 ft @ mod fast pace, stop   Depression    Depression, major, single episode, moderate (HCC) 03/19/2008   Qualifier: Diagnosis of   By: Jame  MD, Maude FALCON        Dyspnea    Emphysema of lung (HCC) 03/13/2018   Emphysema, unspecified (HCC)    per patient mild case   Epigastric pain 10/09/2022   Esophageal dysphagia 05/11/2023   Family history of adverse reaction to anesthesia    patient states sister has a hard time waking up from anesthesia.    GERD 03/19/2008   Qualifier: Diagnosis of   By: Jame  MD, Maude FALCON         Grief reaction 01/24/2022   Hepatitis B core antibody positive 10/09/2022   Hepatitis C    Hepatitis C, chronic (HCC) 03/14/2015   Hiatal hernia 10/09/2022   History of hepatitis C 10/09/2022   Left upper lobe pulmonary nodule 02/04/2018   IMPRESSION:  1. Negative for acute pulmonary embolus or aortic dissection  2. 14 mm left upper lobe pulmonary nodule, increased in size  compared to prior and therefore felt suspicious for carcinoma.  Consider correlation with PET-CT and/or tissue sampling.  3. Mild emphysema     Aortic Atherosclerosis (ICD10-I70.0) and Emphysema (ICD10-J43.9).        Electronically Signed    By: Luke Bun M.D   lung ca dx'd 03/2018   Lung mass 03/13/2018   Migraine headache 08/01/2010   Qualifier: Diagnosis of   By: Jame  MD, Maude FALCON        Mitral stenosis    NEPHROLITHIASIS, HX OF 03/19/2008   Qualifier: Diagnosis of   By: Jame  MD, Maude FALCON        Nonrheumatic mitral (valve) stenosis    Oxygen  deficiency    Palpitations 02/13/2024   Pill dysphagia 10/09/2022   Pleural effusion 10/06/2021   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  SOB (shortness of breath) 09/18/2021   Tobacco use disorder 01/21/2014   Tremor 10/14/2012   Tremor, unspecified    non-specific; takes inderal .    Vaccine counseling 11/13/2017   Past Surgical History:  Past Surgical History:  Procedure Laterality Date   ABDOMINAL HYSTERECTOMY  1986   menorrhagia; hx of cryo for abnormal paps   APPENDECTOMY     removed in 1986 per pt   BIOPSY  10/31/2022   Procedure: BIOPSY;  Surgeon: Wilhelmenia Aloha Raddle., MD;  Location: [facility];  Service: Gastroenterology;;   CATARACT EXTRACTION Bilateral 07/2022   ESOPHAGOGASTRODUODENOSCOPY (EGD) WITH PROPOFOL  N/A 10/31/2022   Procedure: ESOPHAGOGASTRODUODENOSCOPY (EGD) WITH PROPOFOL ;  Surgeon: Wilhelmenia Aloha Raddle., MD;  Location: [facility];  Service: Gastroenterology;  Laterality: N/A;   INTERCOSTAL NERVE BLOCK Right  12/18/2019   Procedure: Intercostal Nerve Block;  Surgeon: Kerrin Elspeth BROCKS, MD;  Location: [facility] OR;  Service: Thoracic;  Laterality: Right;   LOBECTOMY Left 03/13/2018   Procedure: Lingula section of left upper lobe lobectomy;  Surgeon: Kerrin Elspeth BROCKS, MD;  Location: [facility] OR;  Service: Thoracic;  Laterality: Left;   NODE DISSECTION  12/18/2019   Procedure: Node Dissection;  Surgeon: Kerrin Elspeth BROCKS, MD;  Location: [facility] OR;  Service: Thoracic;;   SAVORY DILATION N/A 10/31/2022   Procedure: HARLEY HODGKIN;  Surgeon: Wilhelmenia Aloha Raddle., MD;  Location: [facility];  Service: Gastroenterology;  Laterality: N/A;   VIDEO ASSISTED THORACOSCOPY (VATS)/WEDGE RESECTION Left 03/13/2018   Procedure: VIDEO ASSISTED THORACOSCOPY (VATS)/WEDGE RESECTION;  Surgeon: Kerrin Elspeth BROCKS, MD;  Location: [facility] OR;  Service: Thoracic;  Laterality: Left;   HPI:  69 yo female admitted with COPD exac, pyelonephritis, atypical chest pain. Hx: GERD, hiatal hernia, pill dysphagia, COPD-O2 dep 4L, lung Ca-lobectomy, anxiety/depression, essential tremor, migraines. CXR 11/05/24 New airspace disease in the right upper lobe suspicious for pneumonia. Noted to have recurrent pneumonia.    Assessment / Plan / Recommendation  Clinical Impression  No signs of pharyngeal dysphagia however incidence of undetected dysphagia via clinical observation is higher in pt's with COPD. Pt with mild apprehension with assessment stating she does not cough or have difficulty with po's. SLP explained relationship between COPD, aspiration any reasoning for decreased sensation (silent aspiration) with pt understanding and agreement for clinical evaluation. Consumed thin liquid and cracker and as supsected did not exhibit s/s aspiration. Explained MBS study recommendation and pt agreeable. Continue regular/thin and plan for MBS target in am if xray can accommodate. SLP Visit Diagnosis: Dysphagia, unspecified (R13.10)    Aspiration Risk   Mild aspiration risk    Diet Recommendation           Other Recommendations Oral Care Recommendations: Oral care BID     Swallow Evaluation Recommendations Recommendations: PO diet PO Diet Recommendation: Regular;Thin liquids (Level 0) Liquid Administration via: Cup;Straw Medication Administration: Whole meds with liquid Supervision: Patient able to self-feed Postural changes: Position pt fully upright for meals Oral care recommendations: Oral care BID (2x/day)   Assistance Recommended at Discharge    Functional Status Assessment    Frequency and Duration            Prognosis        Swallow Study   General Date of Onset: 11/05/24 HPI: 69 yo female admitted with COPD exac, pyelonephritis, atypical chest pain. Hx: GERD, hiatal hernia, pill dysphagia, COPD-O2 dep 4L, lung Ca-lobectomy, anxiety/depression, essential tremor, migraines. CXR 11/05/24 New airspace disease in the right upper lobe suspicious for pneumonia. Noted to have recurrent  pneumonia. Type of Study: Bedside Swallow Evaluation Previous Swallow Assessment:  (none) Diet Prior to this Study: Regular;Thin liquids (Level 0) Temperature Spikes Noted: No Respiratory Status: Nasal cannula History of Recent Intubation: No Behavior/Cognition: Alert;Cooperative;Pleasant mood Oral Cavity Assessment: Within Functional Limits Oral Care Completed by SLP: No Oral Cavity - Dentition: Missing dentition Vision: Functional for self-feeding Self-Feeding Abilities: Able to feed self Patient Positioning: Upright in bed Baseline Vocal Quality: Other (comment) (appears to have a vocal tremor?) Volitional Cough: Strong Volitional Swallow: Able to elicit    Oral/Motor/Sensory Function Overall Oral Motor/Sensory Function: Within functional limits   Ice Chips Ice chips: Not tested   Thin Liquid Thin Liquid: Within functional limits Presentation: Straw    Nectar Thick Nectar Thick Liquid: Not tested   Honey Thick Honey Thick Liquid:  Not tested   Puree Puree: Not tested   Solid     Solid: Within functional limits      Dustin Olam Bull 11/05/2024,4:37 PM

## 2024-11-05 NOTE — Plan of Care (Signed)

## 2024-11-06 ENCOUNTER — Inpatient Hospital Stay (HOSPITAL_COMMUNITY)

## 2024-11-06 ENCOUNTER — Other Ambulatory Visit (HOSPITAL_COMMUNITY): Payer: Self-pay

## 2024-11-06 ENCOUNTER — Telehealth: Payer: Self-pay | Admitting: Pulmonary Disease

## 2024-11-06 LAB — COMPREHENSIVE METABOLIC PANEL WITH GFR
ALT: 12 U/L (ref 0–44)
AST: 15 U/L (ref 15–41)
Albumin: 4 g/dL (ref 3.5–5.0)
Alkaline Phosphatase: 79 U/L (ref 38–126)
Anion gap: 11 (ref 5–15)
BUN: 23 mg/dL (ref 8–23)
CO2: 26 mmol/L (ref 22–32)
Calcium: 9 mg/dL (ref 8.9–10.3)
Chloride: 100 mmol/L (ref 98–111)
Creatinine, Ser: 0.8 mg/dL (ref 0.44–1.00)
GFR, Estimated: >60 mL/min
Glucose, Bld: 130 mg/dL — ABNORMAL HIGH (ref 70–99)
Potassium: 4 mmol/L (ref 3.5–5.1)
Sodium: 138 mmol/L (ref 135–145)
Total Bilirubin: 0.3 mg/dL (ref 0.0–1.2)
Total Protein: 7 g/dL (ref 6.5–8.1)

## 2024-11-06 LAB — CBC WITH DIFFERENTIAL/PLATELET
Abs Immature Granulocytes: 0.29 10*3/uL — ABNORMAL HIGH (ref 0.00–0.07)
Basophils Absolute: 0 10*3/uL (ref 0.0–0.1)
Basophils Relative: 0 %
Eosinophils Absolute: 0 10*3/uL (ref 0.0–0.5)
Eosinophils Relative: 0 %
HCT: 40.6 % (ref 36.0–46.0)
Hemoglobin: 12.8 g/dL (ref 12.0–15.0)
Immature Granulocytes: 3 %
Lymphocytes Relative: 8 %
Lymphs Abs: 0.8 10*3/uL (ref 0.7–4.0)
MCH: 27.2 pg (ref 26.0–34.0)
MCHC: 31.5 g/dL (ref 30.0–36.0)
MCV: 86.4 fL (ref 80.0–100.0)
Monocytes Absolute: 0.3 10*3/uL (ref 0.1–1.0)
Monocytes Relative: 3 %
Neutro Abs: 8.7 10*3/uL — ABNORMAL HIGH (ref 1.7–7.7)
Neutrophils Relative %: 86 %
Platelets: 300 10*3/uL (ref 150–400)
RBC: 4.7 MIL/uL (ref 3.87–5.11)
RDW: 12.8 % (ref 11.5–15.5)
WBC: 10.1 10*3/uL (ref 4.0–10.5)
nRBC: 0 % (ref 0.0–0.2)

## 2024-11-06 LAB — MAGNESIUM: Magnesium: 2.6 mg/dL — ABNORMAL HIGH (ref 1.7–2.4)

## 2024-11-06 LAB — PHOSPHORUS: Phosphorus: 3.9 mg/dL (ref 2.5–4.6)

## 2024-11-06 MED ORDER — LEVOFLOXACIN 750 MG PO TABS
750.0000 mg | ORAL_TABLET | Freq: Every day | ORAL | 0 refills | Status: AC
  Filled 2024-11-06: qty 5, 5d supply, fill #0

## 2024-11-06 MED ORDER — ACETAMINOPHEN 500 MG PO TABS
1000.0000 mg | ORAL_TABLET | Freq: Three times a day (TID) | ORAL | 0 refills | Status: AC | PRN
  Filled 2024-11-06: qty 20, 4d supply, fill #0

## 2024-11-06 MED ORDER — PREDNISONE 10 MG PO TABS
ORAL_TABLET | ORAL | 0 refills | Status: AC
  Filled 2024-11-06: qty 35, 15d supply, fill #0

## 2024-11-06 MED ORDER — METHYLPREDNISOLONE SODIUM SUCC 40 MG IJ SOLR: 40.0000 mg | Freq: Two times a day (BID) | INTRAMUSCULAR | Status: DC

## 2024-11-06 NOTE — Telephone Encounter (Signed)
 Please cancel hospital follow-up with Dr. Darlean in 3 to 8 weeks.  Thank you.

## 2024-11-06 NOTE — Plan of Care (Signed)

## 2024-11-06 NOTE — Plan of Care (Signed)
 Patient was discharged to home today. Discharge instructions were given to the patient and she verbalized she understood. Meds were delivered to her from our pharmacy. All personal belongings were taken with the patient. Vitals were stable. Patient left here via private vehicle to for home.

## 2024-11-06 NOTE — Consult Note (Addendum)
 "  NAME:  [name], MRN:  [id number], DOB:  08-19-1956, LOS: 6 ADMISSION DATE:  10/31/2024, CONSULTATION DATE:  11/06/24 REFERRING MD:  TRH, CHIEF COMPLAINT:  DOE   History of Present Illness:  69 year old with COPD and chronic hypoxemic respiratory failure on 4 L nasal cannula presented with COPD exacerbation.  Several days of worsening dyspnea, chest tightness wheeze.  Got to the point where she could not breathe.  Presented to the ED.  Chest x-ray in the ED clear on my review and interpretation.  Was treated with steroids.  Breathing treatments.  Repeat chest x-ray 5 days later revealed mild upper lobe infiltrate relatively minimal on my review and interpretation.  Additional antibiotics added.  She is improved overall.  On 4 L nasal cannula, current baseline.  No longer wheezing.  Still feels tight.  She is concerned because she had about pneumonia last year around this time and got readmitted to the hospital a couple weeks later with ongoing symptoms.  Chest x-ray 10/01/2023 revealed right midlung infiltrate albeit mild on my review interpretation.  Repeat chest x-ray 1/26-20 25 with similar findings actually improved on my review interpretation.  We discussed at length her prior admission was likely ongoing inflammation from prior pneumonia.  Certainly at risk at this time.  Her COPD and emphysema.  Risk for this.  We discussed biologic therapy, she was on Dupixent  the past but could not tolerate due to pain at injection site.  Discussed biologic therapy would be most helpful to improve exacerbations like this and not day-to-day symptoms.  Pertinent  Medical History    Significant Hospital Events: Including procedures, antibiotic start and stop dates in addition to other pertinent events   10/31/2024 admitted to the hospital with COPD exacerbation 11/06/2024 pulm medicine consulted  Interim History / Subjective:    Objective    Blood pressure 112/62, pulse 72, temperature 97.7 F  (36.5 C), resp. rate 18, height 5' 4 (1.626 m), weight 75.8 kg, SpO2 98%.        Intake/Output Summary (Last 24 hours) at 11/06/2024 1154 Last data filed at 11/06/2024 0303 Gross per 24 hour  Intake 640 ml  Output --  Net 640 ml   Filed Weights   11/01/24 1451  Weight: 75.8 kg    Examination: General: In bed no distress HENT: Atraumatic normocephalic Lungs: Clear bilaterally, decreased air excursion Cardiovascular: Regular rate and rhythm Abdomen: Nondistended  Resolved problem list   Assessment and Plan   Acute on chronic hypoxemic respiratory failure: Due to COPD exacerbation.  With development of pneumonia on chest x-ray. -- Continue IV steroids, stop date placed given initial improvement, if continues to improve as anticipated then would recommend transition to prednisone  40 mg for 5 days then 20 mg for 5 days then 10 mg for 5 days then stop -- Continue scheduled nebulized therapy for baseline COPD, continue as needed nebulizers, bronchodilators, can resume home medications at discharge -- She was placed on Dupixent  in the past, maximal eosinophil level of 300 in  2025, did not tolerate this due to pain.  I think she probably does need biologic therapy but she indicated due to the pain she would not tolerate it, consider discussing Nucala as an outpatient  Community-acquired pneumonia: Developed upper lobe infiltrate albeit mild on serial chest x-ray this admission. -- Continue antibiotics for total of 7 days, continue IV antibiotic for now, if ready for discharge prior to completion of course recommend discharge on Augmentin  twice daily to complete  7 days total  PCCM will sign off.  Will send message to arrange outpatient follow-up.  Labs   CBC: Recent Labs  Lab 10/31/24 1702 11/04/24 0916 11/05/24 0446 11/06/24 0450  WBC 9.3 8.8 6.5 10.1  NEUTROABS 6.7 7.7 5.5 8.7*  HGB 13.0 13.0 11.9* 12.8  HCT 41.3 41.6 37.2 40.6  MCV 86.0 87.4 85.1 86.4  PLT 274 299 242 300     Basic Metabolic Panel: Recent Labs  Lab 10/31/24 1702 11/04/24 0916 11/05/24 0446 11/06/24 0450  NA 139 138 139 138  K 4.0 4.2 4.4 4.0  CL 101 101 103 100  CO2 28 25 26 26   GLUCOSE 119* 126* 128* 130*  BUN 16 26* 26* 23  CREATININE 0.83 0.79 0.80 0.80  CALCIUM  10.0 9.6 8.9 9.0  MG  --  2.4 2.3 2.6*  PHOS  --  3.3 3.4 3.9   GFR: Estimated Creatinine Clearance: 66.1 mL/min (by C-G formula based on SCr of 0.8 mg/dL). Recent Labs  Lab 10/31/24 1702 11/04/24 0916 11/05/24 0446 11/06/24 0450  WBC 9.3 8.8 6.5 10.1    Liver Function Tests: Recent Labs  Lab 10/31/24 1702 11/04/24 0916 11/05/24 0446 11/06/24 0450  AST 19 19 14* 15  ALT 10 11 11 12   ALKPHOS 107 87 76 79  BILITOT 0.7 0.3 <0.2 0.3  PROT 8.3* 7.7 6.6 7.0  ALBUMIN  4.3 4.3 3.8 4.0   Recent Labs  Lab 10/31/24 1702  LIPASE 14   No results for input(s): AMMONIA in the last 168 hours.  ABG    Component Value Date/Time   PHART 7.389 12/19/2019 0412   PCO2ART 40.7 12/19/2019 0412   PO2ART 75.8 (L) 12/19/2019 0412   HCO3 29.4 (H) 10/27/2023 1215   ACIDBASEDEF 0.3 09/18/2021 0523   O2SAT 68.9 10/27/2023 1215     Coagulation Profile: No results for input(s): INR, PROTIME in the last 168 hours.  Cardiac Enzymes: No results for input(s): CKTOTAL, CKMB, CKMBINDEX, TROPONINI in the last 168 hours.  HbA1C: Hgb A1c MFr Bld  Date/Time Value Ref Range Status  09/04/2019 03:45 PM 5.3 <5.7 % of total Hgb Final    Comment:    For the purpose of screening for the presence of diabetes: . <5.7%       Consistent with the absence of diabetes 5.7-6.4%    Consistent with increased risk for diabetes             (prediabetes) > or =6.5%  Consistent with diabetes . This assay result is consistent with a decreased risk of diabetes. . Currently, no consensus exists regarding use of hemoglobin A1c for diagnosis of diabetes in children. . According to American Diabetes Association  (ADA) guidelines, hemoglobin A1c <7.0% represents optimal control in non-pregnant diabetic patients. Different metrics may apply to specific patient populations.  Standards of Medical Care in Diabetes(ADA). .     CBG: No results for input(s): GLUCAP in the last 168 hours.  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), Bronchitis, CAP (community acquired pneumonia) (10/01/2023), Cataract, Chest pain (10/09/2022), Chronic bronchitis (HCC) (02/04/2018), Chronic respiratory failure with hypoxia (HCC) (10/06/2021), Chronic rhinitis (10/06/2021), Cirrhosis (HCC) (11/13/2017), Colon  cancer screening (10/09/2022), Community acquired pneumonia (10/03/2023), COPD GOLD 3  (06/29/2021), Depression, Depression, major, single episode, moderate (HCC) (03/19/2008), Dyspnea, Emphysema of lung (HCC) (03/13/2018), Emphysema, unspecified (HCC), Epigastric pain (10/09/2022), Esophageal dysphagia (05/11/2023), Family history of adverse reaction to anesthesia, GERD (03/19/2008), Grief reaction (01/24/2022), 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), Hepatitis C, Hepatitis C, chronic (HCC) (03/14/2015), Hiatal hernia (10/09/2022), History of hepatitis C (10/09/2022), Left upper lobe pulmonary nodule (02/04/2018), lung ca (dx'd 03/2018), Lung mass (03/13/2018), Migraine headache (08/01/2010), Mitral stenosis, NEPHROLITHIASIS, HX OF (03/19/2008), Nonrheumatic mitral (valve) stenosis, Oxygen  deficiency, Palpitations (02/13/2024), Pill dysphagia (10/09/2022), Pleural effusion (10/06/2021), 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   Surgical History:   Past Surgical History:  Procedure Laterality Date   ABDOMINAL HYSTERECTOMY  1986   menorrhagia; hx of cryo for abnormal paps   APPENDECTOMY     removed in 1986 per pt   BIOPSY  10/31/2022   Procedure: BIOPSY;  Surgeon: Wilhelmenia Aloha Raddle., MD;  Location: [facility];  Service: Gastroenterology;;   CATARACT EXTRACTION Bilateral 07/2022   ESOPHAGOGASTRODUODENOSCOPY (EGD) WITH PROPOFOL  N/A 10/31/2022   Procedure: ESOPHAGOGASTRODUODENOSCOPY (EGD) WITH PROPOFOL ;  Surgeon: Wilhelmenia Aloha Raddle., MD;  Location: [facility];  Service: Gastroenterology;  Laterality: N/A;   INTERCOSTAL NERVE BLOCK Right 12/18/2019   Procedure: Intercostal Nerve Block;  Surgeon: Kerrin Elspeth BROCKS, MD;  Location: [facility] OR;  Service: Thoracic;  Laterality: Right;   LOBECTOMY Left 03/13/2018   Procedure: Lingula section of left upper lobe lobectomy;  Surgeon: Kerrin Elspeth BROCKS, MD;  Location: [facility] OR;  Service: Thoracic;  Laterality: Left;   NODE DISSECTION  12/18/2019   Procedure: Node Dissection;  Surgeon: Kerrin Elspeth BROCKS, MD;  Location: [facility] OR;  Service: Thoracic;;   SAVORY DILATION N/A 10/31/2022   Procedure: HARLEY HODGKIN;  Surgeon: Wilhelmenia Aloha Raddle., MD;  Location: [facility];  Service: Gastroenterology;  Laterality: N/A;   VIDEO ASSISTED THORACOSCOPY (VATS)/WEDGE RESECTION Left 03/13/2018   Procedure: VIDEO ASSISTED THORACOSCOPY (VATS)/WEDGE RESECTION;  Surgeon: Kerrin Elspeth BROCKS, MD;  Location: [facility] OR;  Service: Thoracic;  Laterality: Left;     Social History:   reports that she quit smoking about 6 years ago. Her smoking use included cigarettes. She started smoking about 46 years ago. She has a 40 pack-year smoking history. She has never been exposed to tobacco smoke. She has never used smokeless  tobacco. She reports that she does not currently use alcohol. She reports that she does not currently use drugs.   Family History:  Her family history includes Alzheimer's disease (age of onset: 42) in her mother; Asthma in her mother; Cancer in her maternal aunt, paternal aunt, and paternal grandmother; Hypertension in her mother; Lung cancer in her paternal uncle; Neuropathy in her sister; Other in her father. There is no history of Colon cancer, Esophageal cancer, Inflammatory bowel disease, Liver disease, Pancreatic cancer, Rectal cancer, Stomach cancer, Tremor, or Parkinson's disease.   Allergies Allergies[1]   Home Medications  Prior to Admission medications  Medication Sig Start Date End Date Taking? Authorizing Provider  albuterol  (PROVENTIL ) (2.5 MG/3ML) 0.083% nebulizer solution TAKE 3 ML (2.5 MG TOTAL) BY NEBULIZATION EVERY 4 HOURS AS NEEDED FOR WHEEZING OR SHORTNESS OF BREATH Patient taking differently: Take 2.5 mg by nebulization every 4 (four) hours as needed for wheezing or shortness of breath. 05/22/24  Yes Darlean Ozell NOVAK, MD  albuterol  (VENTOLIN  HFA) 108 (90 Base) MCG/ACT inhaler  Inhale 2 puffs into the lungs every 4 (four) hours as needed for wheezing or shortness of breath. 05/05/24  Yes Darlean Ozell NOVAK, MD  budesonide -glycopyrrolate -formoterol  (BREZTRI  AEROSPHERE) 160-9-4.8 MCG/ACT AERO inhaler 2 sample Patient taking differently: Inhale 2 puffs into the lungs in the morning and at bedtime. 10/02/24  Yes Darlean Ozell NOVAK, MD  clonazePAM  (KLONOPIN ) 0.5 MG tablet Take 1 tablet (0.5 mg total) by mouth 3 (three) times daily as needed. for anxiety Patient taking differently: Take 0.5 mg by mouth at bedtime. 06/09/24  Yes Nafziger, Darleene, NP  esomeprazole  (NEXIUM ) 40 MG capsule TAKE 1 CAPSULE (40 MG TOTAL) BY MOUTH DAILY. Patient taking differently: Take 40 mg by mouth at bedtime. 08/17/24  Yes Beather Delon Gibson, PA  fluticasone  (FLONASE ) 50 MCG/ACT nasal spray Place 2 sprays into both  nostrils in the morning and at bedtime.   Yes [provider]  hydrOXYzine  (VISTARIL ) 25 MG capsule Take 1 capsule (25 mg total) by mouth every 8 (eight) hours as needed. Patient taking differently: Take 25 mg by mouth every 8 (eight) hours as needed for itching (for itching related to insect bites). 02/06/24  Yes Nafziger, Darleene, NP  Ibuprofen  200 MG CAPS Take 200-800 mg by mouth every 8 (eight) hours as needed (for migraines or mild pain).   Yes [provider]  OHTUVAYRE  3 MG/2.5ML SUSP Inhale 3 mg into the lungs in the morning and at bedtime.   Yes [provider]  OXYGEN  Inhale 4 L/min into the lungs continuous.   Yes [provider]  PARoxetine  (PAXIL ) 40 MG tablet TAKE 1 TABLET BY MOUTH EVERY DAY Patient taking differently: Take 40 mg by mouth at bedtime. 08/30/24  Yes Nafziger, Darleene, NP  propranolol  (INDERAL ) 20 MG tablet Take 1 capsule (60 mg total) by mouth daily. Patient taking differently: Take 20 mg by mouth at bedtime. 06/03/24  Yes Nafziger, Darleene, NP  rosuvastatin  (CRESTOR ) 5 MG tablet Take 1 tablet (5 mg total) by mouth 3 (three) times a week. Patient taking differently: Take 5 mg by mouth See admin instructions. Take 5 mg by mouth at bedtime on Mon/Wed/Fri 05/20/24  Yes Madireddy, Alean SAUNDERS, MD  traZODone  (DESYREL ) 100 MG tablet TAKE 1 TABLET (100 MG TOTAL) BY MOUTH AT BEDTIME AS NEEDED. FOR SLEEP. NEED PHYSICAL EXAM FOR FURTHER REFILLS Patient taking differently: Take 100 mg by mouth at bedtime. 05/22/24  Yes Nafziger, Darleene, NP  Budeson-Glycopyrrol-Formoterol  (BREZTRI  AEROSPHERE) 160-9-4.8 MCG/ACT AERO Inhale 2 puffs into the lungs 2 (two) times daily. Patient not taking: Reported on 10/31/2024 04/09/23   Hope Almarie ORN, NP     Critical care time: n/a    Donnice SAUNDERS Beals, MD See TRACEY           [1]  Allergies Allergen Reactions   Aspirin Shortness Of Breath   Trelegy Ellipta  [Fluticasone -Umeclidin-Vilant] Shortness Of Breath and  Other (See Comments)    She reports that is made her cough worse, also   Morphine Sulfate Other (See Comments)    RED STREAKS ON ARMS   Oxycodone  Itching and Rash   Hydrocodone  Itching   Sulfa Antibiotics Tinitus   "

## 2024-11-06 NOTE — Progress Notes (Signed)
 Modified Barium Swallow Study  Patient Details  Name: Renella Steig MRN: [id number] Date of Birth: 1956-05-25  Today's Date: 11/06/2024  Modified Barium Swallow completed.  Full report located under Chart Review in the Imaging Section.  History of Present Illness 69 yo female admitted with COPD exac, pyelonephritis, atypical chest pain. Hx: GERD, hiatal hernia, pill dysphagia, COPD-O2 dep 4L, lung Ca-lobectomy, anxiety/depression, essential tremor, migraines. CXR 11/05/24 New airspace disease in the right upper lobe suspicious for pneumonia. Noted to have recurrent pneumonia. MBS to assess oropharyngeal swallow.   Clinical Impression Pt demonstrated a normal safe and efficent oropharyngeal swallow withour presence of penetration or aspiration. She does have a prominent cricopharyngeus muscle with mildly decreased distention. Barium entered the PES which closed prematurely and minimal amount of barium asending through PES. Barium pill noted to timely enter GE junction. Recommend pt continue regular texture, thin liquids, rest breaks if needed if SOB. No further ST intervention needed. Factors that may increase risk of adverse event in presence of aspiration Noe & Lianne 2021): Respiratory or GI disease  Swallow Evaluation Recommendations Recommendations: PO diet PO Diet Recommendation: Regular;Thin liquids (Level 0) Liquid Administration via: Cup;Straw Medication Administration: Whole meds with liquid Supervision: Patient able to self-feed Postural changes: Position pt fully upright for meals Oral care recommendations: Oral care BID (2x/day)      Dustin Olam Bull 11/06/2024,4:11 PM

## 2024-11-06 NOTE — Progress Notes (Signed)
 Discharge medications delivered to patient at the bedside in a secure bag.

## 2024-12-09 ENCOUNTER — Ambulatory Visit: Admitting: Internal Medicine
